# Patient Record
Sex: Male | Born: 1940 | Race: White | Hispanic: No | Marital: Married | Smoking: Never smoker
Health system: Southern US, Community
[De-identification: ages and names within clinical notes are randomized; demographics above are authoritative.]

## PROBLEM LIST (undated history)

## (undated) DIAGNOSIS — H3320 Serous retinal detachment, unspecified eye: Secondary | ICD-10-CM

## (undated) DIAGNOSIS — Z87442 Personal history of urinary calculi: Secondary | ICD-10-CM

## (undated) DIAGNOSIS — Z8719 Personal history of other diseases of the digestive system: Secondary | ICD-10-CM

## (undated) DIAGNOSIS — H35039 Hypertensive retinopathy, unspecified eye: Secondary | ICD-10-CM

## (undated) DIAGNOSIS — E039 Hypothyroidism, unspecified: Secondary | ICD-10-CM

## (undated) DIAGNOSIS — I2510 Atherosclerotic heart disease of native coronary artery without angina pectoris: Secondary | ICD-10-CM

## (undated) DIAGNOSIS — M1990 Unspecified osteoarthritis, unspecified site: Secondary | ICD-10-CM

## (undated) DIAGNOSIS — E785 Hyperlipidemia, unspecified: Secondary | ICD-10-CM

## (undated) DIAGNOSIS — I10 Essential (primary) hypertension: Secondary | ICD-10-CM

## (undated) DIAGNOSIS — K219 Gastro-esophageal reflux disease without esophagitis: Secondary | ICD-10-CM

## (undated) DIAGNOSIS — Z8709 Personal history of other diseases of the respiratory system: Secondary | ICD-10-CM

## (undated) DIAGNOSIS — G4730 Sleep apnea, unspecified: Secondary | ICD-10-CM

## (undated) HISTORY — PX: TONSILLECTOMY: SUR1361

## (undated) HISTORY — DX: Hyperlipidemia, unspecified: E78.5

## (undated) HISTORY — PX: EYE SURGERY: SHX253

## (undated) HISTORY — PX: UVULOPALATOPHARYNGOPLASTY: SHX827

## (undated) HISTORY — PX: CATARACT EXTRACTION: SUR2

## (undated) HISTORY — DX: Hypertensive retinopathy, unspecified eye: H35.039

## (undated) HISTORY — DX: Serous retinal detachment, unspecified eye: H33.20

## (undated) HISTORY — PX: PACEMAKER INSERTION: SHX728

---

## 1983-06-06 HISTORY — PX: ESOPHAGOGASTRIC FUNDOPLICATION: SHX405

## 1983-06-06 HISTORY — PX: HERNIA REPAIR: SHX51

## 1988-06-05 HISTORY — PX: UMBILICAL HERNIA REPAIR: SUR1181

## 1990-06-05 HISTORY — PX: INGUINAL HERNIA REPAIR: SUR1180

## 1998-04-07 ENCOUNTER — Ambulatory Visit (HOSPITAL_COMMUNITY): Admission: RE | Admit: 1998-04-07 | Discharge: 1998-04-07 | Payer: Self-pay | Admitting: *Deleted

## 1998-04-07 ENCOUNTER — Encounter: Payer: Self-pay | Admitting: *Deleted

## 1998-04-27 ENCOUNTER — Ambulatory Visit (HOSPITAL_COMMUNITY): Admission: RE | Admit: 1998-04-27 | Discharge: 1998-04-27 | Payer: Self-pay | Admitting: *Deleted

## 1999-06-13 ENCOUNTER — Encounter: Admission: RE | Admit: 1999-06-13 | Discharge: 1999-06-13 | Payer: Self-pay | Admitting: Family Medicine

## 1999-06-27 ENCOUNTER — Encounter: Admission: RE | Admit: 1999-06-27 | Discharge: 1999-06-27 | Payer: Self-pay | Admitting: Sports Medicine

## 2000-02-02 ENCOUNTER — Encounter (INDEPENDENT_AMBULATORY_CARE_PROVIDER_SITE_OTHER): Payer: Self-pay | Admitting: *Deleted

## 2000-02-02 ENCOUNTER — Ambulatory Visit (HOSPITAL_COMMUNITY): Admission: RE | Admit: 2000-02-02 | Discharge: 2000-02-02 | Payer: Self-pay | Admitting: *Deleted

## 2001-12-25 ENCOUNTER — Ambulatory Visit (HOSPITAL_BASED_OUTPATIENT_CLINIC_OR_DEPARTMENT_OTHER): Admission: RE | Admit: 2001-12-25 | Discharge: 2001-12-25 | Payer: Self-pay | Admitting: *Deleted

## 2002-04-02 ENCOUNTER — Ambulatory Visit (HOSPITAL_BASED_OUTPATIENT_CLINIC_OR_DEPARTMENT_OTHER): Admission: RE | Admit: 2002-04-02 | Discharge: 2002-04-02 | Payer: Self-pay | Admitting: *Deleted

## 2002-04-02 ENCOUNTER — Encounter (INDEPENDENT_AMBULATORY_CARE_PROVIDER_SITE_OTHER): Payer: Self-pay | Admitting: *Deleted

## 2003-03-30 ENCOUNTER — Encounter (INDEPENDENT_AMBULATORY_CARE_PROVIDER_SITE_OTHER): Payer: Self-pay

## 2003-03-30 ENCOUNTER — Ambulatory Visit (HOSPITAL_COMMUNITY): Admission: RE | Admit: 2003-03-30 | Discharge: 2003-03-30 | Payer: Self-pay | Admitting: *Deleted

## 2003-06-06 HISTORY — PX: UVULECTOMY: SHX2631

## 2004-06-09 ENCOUNTER — Ambulatory Visit (HOSPITAL_COMMUNITY): Admission: RE | Admit: 2004-06-09 | Discharge: 2004-06-10 | Payer: Self-pay | Admitting: Cardiovascular Disease

## 2005-05-30 ENCOUNTER — Ambulatory Visit (HOSPITAL_BASED_OUTPATIENT_CLINIC_OR_DEPARTMENT_OTHER): Admission: RE | Admit: 2005-05-30 | Discharge: 2005-05-30 | Payer: Self-pay | Admitting: Otolaryngology

## 2005-06-04 ENCOUNTER — Ambulatory Visit: Payer: Self-pay | Admitting: Internal Medicine

## 2005-10-10 ENCOUNTER — Ambulatory Visit (HOSPITAL_COMMUNITY): Admission: RE | Admit: 2005-10-10 | Discharge: 2005-10-11 | Payer: Self-pay | Admitting: Orthopedic Surgery

## 2006-02-28 ENCOUNTER — Ambulatory Visit (HOSPITAL_COMMUNITY): Admission: RE | Admit: 2006-02-28 | Discharge: 2006-03-01 | Payer: Self-pay | Admitting: Otolaryngology

## 2006-06-05 HISTORY — PX: CARDIAC CATHETERIZATION: SHX172

## 2007-05-25 ENCOUNTER — Encounter: Admission: RE | Admit: 2007-05-25 | Discharge: 2007-05-25 | Payer: Self-pay | Admitting: General Surgery

## 2007-08-06 ENCOUNTER — Ambulatory Visit (HOSPITAL_COMMUNITY): Admission: RE | Admit: 2007-08-06 | Discharge: 2007-08-07 | Payer: Self-pay | Admitting: General Surgery

## 2007-08-08 ENCOUNTER — Emergency Department (HOSPITAL_COMMUNITY): Admission: EM | Admit: 2007-08-08 | Discharge: 2007-08-08 | Payer: Self-pay | Admitting: Emergency Medicine

## 2008-01-14 ENCOUNTER — Ambulatory Visit (HOSPITAL_COMMUNITY): Admission: RE | Admit: 2008-01-14 | Discharge: 2008-01-14 | Payer: Self-pay | Admitting: *Deleted

## 2008-01-14 ENCOUNTER — Encounter (INDEPENDENT_AMBULATORY_CARE_PROVIDER_SITE_OTHER): Payer: Self-pay | Admitting: *Deleted

## 2008-06-01 ENCOUNTER — Ambulatory Visit (HOSPITAL_COMMUNITY): Admission: RE | Admit: 2008-06-01 | Discharge: 2008-06-01 | Payer: Self-pay | Admitting: Orthopedic Surgery

## 2010-10-18 NOTE — Op Note (Signed)
NAME:  [name] Glass, [name] Glass                ACCOUNT NO.:  [id number]   MEDICAL RECORD NO.:  [id number]          PATIENT TYPE:  AMB   LOCATION:  DAY                          FACILITY:  [facility]   PHYSICIAN:  Marlowe Kays, M.D.  DATE OF BIRTH:  [birth year]   DATE OF PROCEDURE:  06/01/2008  DATE OF DISCHARGE:                               OPERATIVE REPORT   PREOPERATIVE DIAGNOSES:  1. Chronic olecranon bursitis, right elbow.  2. Chronic impingement syndrome, right shoulder, with rotator cuff      tendinopathy and impingement of acromioclavicular joint on the      rotator cuff.   POSTOPERATIVE DIAGNOSES:  1. Chronic olecranon bursitis, right elbow.  2. Chronic impingement syndrome, right shoulder, with rotator cuff      tendinopathy and impingement of acromioclavicular joint on the      rotator cuff.   OPERATION:  1. Excision of olecranon bursa, right elbow.  2. Right shoulder arthroscopy (essentially normal glenohumeral      examination).  3. Arthroscopic subacromial decompression.  4. Arthroscopic decompression of distal clavicle.   SURGEON:  Marlowe Kays, M.D.   ASSISTANTDruscilla Brownie. Idolina Primer, PA-C   ANESTHESIA:  General.   PATHOLOGY AND JUSTIFICATION FOR PROCEDURE:  He has had a number of  injections in his right elbow with persistent olecranon bursa and  desired this excised.  He has also had chronic pain in his right  shoulder with some tendinosis of the rotator cuff.  He has had a similar  problem in the left shoulder which has done well following arthroscopic  treatment.   PROCEDURE:  Satisfactory general anesthesia first in the supine position  on the spine frame.  Applied pneumatic tourniquet to his right upper  extremity, esmarched out his right arm non sterilely and  inflated  tourniquet to 250 mmHg.  We then prepped the extremity from tourniquet  to wrist with DuraPrep and draped in a sterile field.  Time-out  performed.  I made a gentle curved incision favoring the  radial side  around the olecranon, carrying down through the subcutaneous tissue.  He  had a very vascular bursa.  The bursa was excised carefully.  I did not  violate the ulnar nerve space.  Bleeders were coagulated.  I then  released the tourniquet.  Minimal bleeding was present.  I felt  comfortable closing the wound without a drain.  After irrigating it well  and then injecting the soft tissue with half percent Marcaine with  adrenaline, I closed the wound with interrupted 2-0 Vicryl on the  subcutaneous tissue and staples in the skin.  Our scrub nurse remained  stable while we removed all the drapes, and we then put him in the beach  chair position, and prepped his right shoulder girdle with DuraPrep,  draped in a sterile field.  Time-out had already been performed.  I  marked out the anatomy of the shoulder joint and then infiltrated the  sites for posterior and lateral portals in the subacromial space with  the 0.5% Marcaine with adrenaline.  Through a posterior soft spot portal  I  atraumatically entered the glenohumeral joint.  There was one area  which looked like it had divot removed from the humeral head and there  was some minimal fraying of the labrum, but there did not appear to be  enough pathology to warrant any arthroscopic treatment.  I then  redirected the scope into the subacromial space through a lateral  portal, placed a blunt trocar followed by a 4.2 shaver.  He had a good  bit of bursitis which I resected, also had some abrasion of the rotator  cuff which I smoothed down with the shaver.  He had a substantial  impingement syndrome from the acromion and also 1 large jagged spur from  the undersurface of the distal clavicle which was also visible on x-ray.  After picturing these, I then began removing soft tissue with the 90-  degree Arthrocare vaporizer from the undersurface of the acromion and  distal clavicle, and followed this with a 4-mm oval bur, burring down   where needed.  I then went back and forth between the 3 instruments  until we had a nice clean decompressed subacromial joint and distal  clavicle.  At the conclusion of the case there was no unusual bleeding.  Final pictures were taken with the vaporizer in place and the arm at his  side and arm abducted.  I then removed all fluid possible from the  subacromial space.  The portals were closed with 4-0 nylon and  infiltrated again with 0.5% Marcaine with adrenaline as was the  subacromial space.  Betadine, Adaptic, dry sterile dressing were applied  to both wounds and he was placed in a shoulder immobilizer.  At the time  of this dictation he was on this way to the recovery room in  satisfactory condition with no known complications.           ______________________________  Marlowe Kays, M.D.     JA/MEDQ  D:  06/01/2008  T:  06/01/2008  Job:  [id number]

## 2010-10-18 NOTE — Op Note (Signed)
NAME:  [name] Glass, [name] Glass                ACCOUNT NO.:  [id number]   MEDICAL RECORD NO.:  [id number]          PATIENT TYPE:  AMB   LOCATION:  ENDO                         FACILITY:  [facility]   PHYSICIAN:  Georgiana Spinner, M.D.    DATE OF BIRTH:  1940/08/17   DATE OF PROCEDURE:  01/14/2008  DATE OF DISCHARGE:                               OPERATIVE REPORT   PROCEDURE:  Upper endoscopy with biopsy.   INDICATIONS:  GERD, with question of Barrett's esophagus.   ANESTHESIA:  1. Fentanyl 75 mcg.  2. Versed 7 mg.   PROCEDURE:  With the patient mildly sedated in the left lateral  decubitus position, the Pentax videoscopic endoscope was inserted in the  mouth and passed under direct vision through the esophagus, which  appeared normal, until we reached distal esophagus, and there appeared  to be at least 1 tongue of Barrett's esophagus.  This area was  photographed and biopsied around the perimeter of the squamocolumnar  junction because it would not open well to discern it any further.  We  entered into the stomach.  The fundus was seen, and there were some  erythematous folds were photographed and biopsied.  The body, antrum,  duodenal bulb, and second portion duodenum were also visualized and  appeared normal.  From this point, the endoscope was slowly withdrawn,  taking circumferential views of the duodenal mucosa until the endoscope  had been pulled back into the stomach and placed in retroflexion to view  the stomach from below.  The endoscope was straightened and withdrawn,  taking circumferential views of the remaining gastric and esophageal  mucosa.  The patient's vital signs and pulse oximeter remained stable.  The patient tolerated the procedure well, without apparent  complications.   FINDINGS:  Question of Barrett's esophagus above a hiatal hernia.  Erythematous gastric folds.  Both biopsied.  Await biopsy reports.  The  patient will call me for results and follow up with me as needed  as an  outpatient.           ______________________________  Georgiana Spinner, M.D.     GMO/MEDQ  D:  01/14/2008  T:  01/14/2008  Job:  [id number]   cc:   Molly Maduro L. Foy Guadalajara, M.D.  Fax: [phone]

## 2010-10-18 NOTE — Op Note (Signed)
NAME:  [name] Glass, [name] Glass                ACCOUNT NO.:  [id number]   MEDICAL RECORD NO.:  [id number]          PATIENT TYPE:  AMB   LOCATION:  ENDO                         FACILITY:  [facility]   PHYSICIAN:  Georgiana Spinner, M.D.    DATE OF BIRTH:  [birth year]   DATE OF PROCEDURE:  DATE OF DISCHARGE:                               OPERATIVE REPORT   PROCEDURE:  Colonoscopy.   INDICATIONS:  Colon polyps.   ANESTHESIA:  Fentanyl 50 mcg, Versed 5 mg.   PROCEDURE:  With the patient mildly sedated in the left lateral  decubitus position, a rectal examination was performed.  The prostate  felt somewhat enlarged but symmetrical and non-nodular.  Subsequently  the Pentax videoscopic colonoscope was inserted into the rectum, passed  under direct vision with pressure applied, the patient rolled to his  back and subsequently to his right side.  We were able to reach the  cecum, identified by the ileocecal valve and appendiceal orifice; both  of which were photographed.  From this point the colonoscope was slowly  withdrawn, taking circumferential views of the colonic mucosa, stopping  only in the rectum, which appeared normal on direct and showed  hemorrhoids on retroflexed view.  The endoscope was straightened and  withdrawn.  The patient's vital signs, pulse oximeter remained stable.  The patient tolerated the procedure well without apparent complication.   FINDINGS:  Internal hemorrhoids, thickening with some diverticula seen  in the sigmoid colon, otherwise unremarkable exam.   PLAN:  Consider repeat examination in 5 years.           ______________________________  Georgiana Spinner, M.D.     GMO/MEDQ  D:  01/14/2008  T:  01/14/2008  Job:  [id number]   cc:   Molly Maduro L. Foy [place], M.D.  Fax: [phone]

## 2010-10-18 NOTE — Op Note (Signed)
NAME:  LARONN, DEVONSHIRE                ACCOUNT NO.:  [id number]   MEDICAL RECORD NO.:  [id number]          PATIENT TYPE:  OIB   LOCATION:  2550                         FACILITY:  [facility]   PHYSICIAN:  Ollen Gross. Vernell Morgans, M.D. DATE OF BIRTH:  August 11, 1940   DATE OF PROCEDURE:  08/06/2007  DATE OF DISCHARGE:                               OPERATIVE REPORT   PREOPERATIVE DIAGNOSIS:  Ventral hernia.   POSTOPERATIVE DIAGNOSIS:  Ventral hernia.   PROCEDURE:  Laparoscopic ventral hernia repair.   SURGEON:  Ollen Gross. Vernell Morgans, M.D.   ASSISTANT:  Currie Paris, M.D.   ANESTHESIA:  General endotracheal.   PROCEDURE:  After informed consent was obtained, the patient was brought  to the operating room and placed in supine position on the operating  room table.  After adequate induction of general anesthesia, the  patient's abdomen was prepped with Betadine and draped in usual sterile  manner, including the use of an Ioban drape in the left upper quadrant.  Site was chosen and infiltrated with 0.25% Marcaine.  A small incision  was made with a 15 blade knife.  This incision was carried through the  skin, and subcutaneous tissue, bluntly with a hemostat until the fascia  of the anterior abdominal wall was encountered.  The fascial layers were  incised with a 15 blade knife and grasped with Kocher clamps.  The  muscle layers were spread gently with Army-Navy retractors in the  posterior fascia was also grasped with hemostats and opened with  Metzenbaum scissors.  Blunt dissection was then carried out in the  preperitoneal space until the peritoneum was opened and access was  gained to the abdominal cavity.  The 0-0 Vicryl pursestring stitch was  placed in the fascia surrounding the opening, a Hasson cannula was  placed through the opening and anchored in place with the previously  placed Vicryl pursestring stitch.  The abdomen was then insufflated with  carbon dioxide without difficulty.  The site  was then chosen in the left  lower quadrant for placement of a 5 mm port and two sites were chosen on  the right side of the abdomen for placement of a 5 mm port through these  incisions into the abdominal cavity under direct vision.  The patient  had some adhesions of omentum and transverse colon to the midline.  All  of this was taken down gently by combination of blunt dissection with a  dissector and a Glassman grasper and some sharp dissection with  laparoscopic scissors and harmonic scalpel.  Once these adhesions were  taken down the anterior abdominal wall appeared to be free.  There were  several small Swiss cheese like defects in the anterior abdominal wall  fascia.  The size of the defect was estimated and a 15 x 20 cm piece of  Proceed mass was chosen to cover the defects.  Eight Novofil stitches  were placed at equidistant points around the edge of the mesh.  The mesh  was oriented with the blue surface towards the ceiling and oriented done  with letters that were  written also on the drapes.  The mesh was then  rolled like a cigar placed through the Hasson cannula into the abdominal  cavity.  The mesh was then unrolled and reoriented.  Appropriately eight  stab incisions were made around the defects in the areas that  corresponded to the placement of the stitches and suture passer was used  to bring the tails of the Novofil stitches through the abdominal wall at  each of these eight points.  Once this was accomplished, retraction was  placed on Novofil stitches and the mesh nicely approximated the anterior  abdominal wall without any redundancy or folds.  Each of the stitches  was then tied down.  A Protacking device was then used to fill in the  gaps between the stitches with Protacks in two layers around the  periphery of the mesh.  Once this was accomplished, the mesh was in very  good position in very good apposition to the abdominal wall.  The  abdomen was then inspected.   No other abnormalities were noted.  The  area appeared to be completely hemostatic.  The gas was then allowed to  escape and the mesh was observed to remain in good apposition to the  abdominal wall without any redundancy. The ports were removed under  direct vision and found to be hemostatic.  Fascial defect at the Hasson  cannula port site was closed a previously placed Vicryl pursestring  stitch.  All the skin incisions were then closed with interrupted 4-0  Monocryl subcuticular stitches.  Dermabond dressings were applied along  with Kerlix fluff and abdominal binder.  The patient tolerated procedure  well.  At the end of the case all needle, sponge and instrument counts  were correct.  The patient was awakened and taken to recovery in stable  condition.      Ollen Gross. Vernell Morgans, M.D.  Electronically Signed     PST/MEDQ  D:  08/06/2007  T:  08/06/2007  Job:  [id number]

## 2010-10-21 NOTE — Op Note (Signed)
   NAME:  [name] Glass, [name] Glass                          ACCOUNT NO.:  [id number]   MEDICAL RECORD NO.:  [id number]                   PATIENT TYPE:  AMB   LOCATION:  ENDO                                 FACILITY:  [facility]   PHYSICIAN:  Georgiana Spinner, M.D.                 DATE OF BIRTH:  [birth year]   DATE OF PROCEDURE:  03/30/2003  DATE OF DISCHARGE:                                 OPERATIVE REPORT   PROCEDURE:  Upper endoscopy with biopsy.   INDICATIONS:  GERD.   ANESTHESIA:  Demerol 60 mg, Versed 6 mg.   DESCRIPTION OF PROCEDURE:  With the patient mildly sedated in the left  lateral decubitus position, the Olympus videoscopic endoscope was inserted  in the mouth and passed under direct vision through the esophagus, which  appeared normal until we reached the distal esophagus, and there were some  changes of possible Barrett's esophagus, photographed and biopsied.  We  entered into the stomach.  The fundus, body, antrum, duodenal bulb, second  portion of the duodenum were visualized.  From this point the endoscope was  slowly withdrawn taking circumferential views of the duodenal mucosa until  the endoscope had been pulled back into the stomach, placed in retroflexion  to view the stomach from below, and a hiatal hernia was seen.  The endoscope  was straightened and withdrawn taking circumferential views of the remaining  gastric and esophageal mucosa, stopping in the antrum and subsequently in  the body of the stomach and fundus of the stomach.  In the antrum, erythema  was seen.  In the body and fundus, polyps were seen.  All were biopsied.  The patient's vital signs and pulse oximetry remained stable.  The patient  tolerated the procedure well and without apparent complications.   FINDINGS:  Polyps of fundus and stomach and erythema of the antrum, along  with Barrett's esophagus, all biopsied.   Await biopsy report.  The patient will call me for results and follow up  with me as an  outpatient.                                               Georgiana Spinner, M.D.    GMO/MEDQ  D:  03/30/2003  T:  03/30/2003  Job:  [id number]

## 2010-10-21 NOTE — Op Note (Signed)
NAME:  CAMEREN, EARNEST                ACCOUNT NO.:  [id number]   MEDICAL RECORD NO.:  [id number]          PATIENT TYPE:  OIB   LOCATION:  3303                         FACILITY:  [facility]   PHYSICIAN:  Zola Button T. Lazarus Salines, M.D. DATE OF BIRTH:  12-Aug-1940   DATE OF PROCEDURE:  DATE OF DISCHARGE:  03/01/2006                                 OPERATIVE REPORT   PREOPERATIVE DIAGNOSES:  1. Obstructive sleep apnea.  2. Hypertrophic inferior turbinates.   POSTOPERATIVE DIAGNOSES:  1. Obstructive sleep apnea.  2. Hypertrophic inferior turbinates.   PROCEDURE PERFORMED:  1. Uvulopalatopharyngoplasty.  2. Bilateral submucous resection inferior turbinates.   SURGEON:  Gloris Manchester. Lazarus Salines, M.D.   ANESTHESIA:  General orotracheal.   BLOOD LOSS:  Minimal.   COMPLICATIONS:  None.   FINDINGS:  A slightly corrugated nasal septum but basically midline.  Large  inferior turbinates bilaterally.  No polyps or active drainage.  Surgically  absent tonsils.  A long thin soft palate and thick central uvula.  An [place]  ink tattoo in the midline of the soft palate placed at the preoperative  visit in the office.   PROCEDURE:  With the patient in a comfortable supine position, general  orotracheal anesthesia was induced without difficulty.  At an appropriate  level, the table was turned 90 degrees and the patient placed in a slight  sitting position.  A saline-moistened throat pack was placed.  Nasal  vibrissae were trimmed.  The patient had received preoperative Afrin nasal  spray.  Additional Afrin nasal spray on 1/2 inch x 3 inch cottonoids was  applied to both sides of the nasal septum and  1% Xylocaine with 1:100,000  epinephrine, 6 mL total, was infiltrated into the inferior turbinates.  An  additional 6 mL of the same material was infiltrated into the bulk of the  soft palate and across the arch for intraoperative hemostasis.  Several  minutes were allowed for this to take effect in both sites.   The  materials were removed from the nose and observed to be intact and  correct in number.  The findings were as described above.  Beginning on the  right side, the anterior hood of the inferior turbinate was lysed just  behind the nasal valve.  The medial mucosa of the turbinate was incised in  an anterior upsloping fashion and a laterally-based flap was developed.  The  turbinate was infractured using angled turbinate scissors.  The turbinate  bone and lateral mucosa were resected in a posterior downsloping fashion,  taking virtually all the anterior pole leaving virtually all the posterior  pole.  The bone chips were dissected and removed to allow more prompt  healing.  The bulbous posterior pole of the turbinate and the cut mucosal  edges were suction and coagulated.  The flap was laid back down.  The  turbinate was outfractured, and this side was completed.  The left side was  done in identical fashion.   A double thickness Neosporin-impregnated Telfa pack was placed low along the  turbinate on each side.  Then 7.0 mm nasal trumpets which  had been shortened  to reach into the nasopharynx were fashioned and placed in each side of the  nose to allow some airway and to serve as part of the packing material.  Hemostasis was observed.  This completed the nasal portion of the procedure.   At this point the patient was flattened and then placed in the true  Trendelenburg.  The drapes were adjusted, taking care to protect lips, teeth  and the endotracheal tube.  The Crowe-Davis mouth gag was introduced,  expanded for visualization and suspended from the Mayo stand in the standard  fashion.  The findings were as described above.  Additional 1% Xylocaine  with 1:100,000 epinephrine, 6 mL total, was infiltrated down the anterior  tonsillar pillars.  Additional time was allowed for hemostasis to take  effect.   The palate was analyzed, including inferior retraction of the uvula and the  tattoo  mark was identified.  The Chevron on the anterior surface of the soft  palate encompassing the [place] ink tattoo mark was placed in the midline with  a sharp scalpel.  A trapezoidal incision horizontally and then angled down  to the anterior pillar was also  begun with a sharp scalpel.  With anterior  and superior traction on the uvula,  a V corresponding with the Chevron was  incised on the posterior surface of the root of the uvula and a mucosal flap  was developed up onto the nasal pharyngeal surface of the soft palate for a  short distance.   Returning to the anterior surface, cautery was used to dissect down through  the minor salivary glands of the soft palate.  The muscularis of the soft  palate was identified and this was carried over and then carried down the  palatopharyngeus muscle.  The mucosa of the anterior tonsillar pillar was  dissected free.  Once the posterior mucosa was identified, the flap was  developed for short distance.  The muscle of the uvula was amputated,  leaving something of a stump.  The dissection was carried across the  anterior pillar at the inferior tonsil fossa and then up, leaving mucosa of  the posterior pillar intact.  The portion of mucosa including anterior  pillars, soft palate and part of the uvula was resected en bloc.  The mucosa  overlying the posterior pillars was further dissected  towards the posterior  pharyngeal wall.  The posterior pillar muscularis was divided near the top  aspect and the stump of the muscle and the palate were retracted laterally  and secured with a #4-0 Vicryl stitch.  The inferior portion was sutured  laterally up into the tonsil fossa.  This began a rather rectangular  configuration of the palatal closure.   The Chevron/mucosal flap was brought from the posterior surface up into the  anterior notch and secured thereto, with no tension using a 4-0 Vicryl stitch.  The closure was taken down and laterally along the free  edge of the  soft palate.  The inferior  portion of the tonsil fossa could not be  completely closed without undue tension and so was allowed to remain open.  There was a slight notch corresponding with the incision into the posterior  tonsillar pillar on both sides which was open and will be allowed to heal by  secondary intention.  By mere inspection, the free edge of the soft palate  was thick but was properly closed and of good configuration.  Hemostasis was  observed.  At this point the mouth gag was relaxed for several minutes.  Upon re-  expansion, hemostasis was persistent.  An orogastric tube was placed briefly  and a small amount of clear secretions was evacuated from the stomach.  The  orogastric tube was removed.  Again hemostasis was observed.  At this point  the mouth gag was relaxed and removed.  The dental status was intact.   The patient was returned to anesthesia, awakened, extubated and transferred  to recovery in stable condition.   COMMENT:  A 70 year old white male with documented obstructive sleep apnea,  with and apnea index of 35 per hour and failed nonsurgical trials including  weight loss, CPAP, nasal steroid sprays, avoidance of sedation; hence the  indication for today's procedure.   Anticipated routine postoperative recovery with attention to analgesia,  antibiosis, hydration, ice, elevation and overnight observation for risk of  hypoxemia and any signs of bleeding or airway obstruction.  Will remove the  nasal packing prior to discharge in the morning and plan on removing the  nasal trumpets at the same time.      Gloris Manchester. Lazarus Salines, M.D.  Electronically Signed     KTW/MEDQ  D:  02/28/2006  T:  03/02/2006  Job:  [id number]   cc:   Nanetta Batty, M.D.  [facility]  Teena Irani. Arlyce Dice, M.D.

## 2010-10-21 NOTE — Procedures (Signed)
[place]. [facility]  Patient:    [name] Glass, [name] Glass                       MRN: [id number] Proc. Date: 02/02/00 Adm. Date:  [id number] Attending:  Sabino Gasser                           Procedure Report  PROCEDURE PERFORMED:  Upper endoscopy.  ENDOSCOPIST:  Sabino Gasser, M.D.  INDICATIONS FOR PROCEDURE:  Barretts esophagus.  ANESTHESIA:  Demerol 100 mg, Versed 10 mg was given intravenously in divided doses.  DESCRIPTION OF PROCEDURE:  With the patient mildly sedated in the left lateral decubitus position, the Olympus video endoscope was inserted in the mouth and passed under direct vision through the esophagus which appeared normal until we reached the distal esophagus where there were changes of hiatal hernia and Barretts esophagus seen, photographed and biopsied.  We entered into the stomach, advanced to the antrum which showed changes of erythema consistent with gastritis.  We photographed this and biopsied this as well.  We entered into the duodenal bulb and second portion of the duodenum both of which normal.  Photographs were taken.  From this point, the endoscope was slowly withdrawn taking circumferential views of the entire duodenal mucosa until the endoscope had been pulled back into the stomach and placed on retroflexion to view the stomach from below and this view showed [place] a number of polyps seen in the gastric folds.  They were photographed and subsequently then using snare cautery technique, a number of them were removed at a setting of 20/20 blended current.  All of them were then pulled up using a Roth retrieval basket and removed from the stomach with the endoscope as we took circumferential views of the remaining gastric and esophageal mucosa.  Patients vital signs and pulse oximeter remained stable.  The patient tolerated the procedure well without apparent complications.  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  PLAN:  Await biopsy report.  Will have patient call me for results and follow up with me as an outpatient.  Proceed to follow-up colonoscopy. PLAN: DD:  02/02/00 TD:  02/03/00 Job: [id number]

## 2010-10-21 NOTE — Op Note (Signed)
NAME:  [name] Glass, [name] Glass                          ACCOUNT NO.:  [id number]   MEDICAL RECORD NO.:  [id number]                   PATIENT TYPE:  AMB   LOCATION:  DSC                                  FACILITY:  [facility]   PHYSICIAN:  Veverly Fells. Arletha Grippe, M.D.             DATE OF BIRTH:  18-Jan-1941   DATE OF PROCEDURE:  04/02/2002  DATE OF DISCHARGE:                                 OPERATIVE REPORT   PREOPERATIVE DIAGNOSES:  Nasal airway obstruction, nasal septal deviation,  inferior turbinate hypertrophy.   POSTOPERATIVE DIAGNOSES:  Nasal airway obstruction, nasal septal deviation,  inferior turbinate hypertrophy.   OPERATION PERFORMED:  Nasal septal reconstruction, intramural cauterization  of both inferior turbinates.   SURGEON:  Veverly Fells. Arletha Grippe, M.D.   ANESTHESIA:  General endotracheal.   INDICATIONS FOR PROCEDURE:  This is a 70 year old white male who gives a  long history of nasal airway obstruction, especially during the night time  hours.  He has been on Flonase which has helped only minimally, resolving  his nasal airway obstruction.  A CT scan of the sinuses was obtained on November 19, 2001 essentially showed a significant septal deviation left with  synechiae between the left inferior turbinate and septum on that side.  The  paranasal sinuses were normal without signs of any infection.  Inpatient  polysomnogram obtained on December 25, 2001 did show an RDI of 12 events per  hour, desaturations down to 87%, consistent with mild obstructive sleep  apnea.  Based on his history and physical examination and failure of medical  therapy, I have recommended proceeding with the above-noted surgical  procedure.  I have discussed extensively with him and his family, the risks  and benefits of surgery including the risks of general anesthesia,  infection, bleeding and the need for septal splinting and a normal recovery  period expected after this type of surgery.  I have entertained any  questions, answered them appropriately.  Informed consent has been obtained.  The patient presents for the above-noted procedure.   OPERATIVE FINDINGS:  Significant septal deviation left with inferior  turbinate hypertrophy with synechiae between the septum and the left  inferior turbinate.   DESCRIPTION OF PROCEDURE:  The patient was brought to the operating room and  placed in supine position.  General endotracheal anesthesia was administered  via the anesthesiologist without complications.  The patient was  administered 1 gm of Ancef IV times one and 10 mg of Decadron IV times one.  The head of the table was elevated approximately 30 degrees and the  patient's face was draped in the standard fashion.  Cotton pledgets were  soaked in a 4% cocaine solution and placed in both nares and left in place  for approximately five to 10 minutes and then removed.  Both sides of the  septum were infiltrated with a 1% lidocaine solution with 1:100,000  epinephrine.  After waiting approximately  10 minutes, a standard Killian  incision was made in the left side of the septum.  Mucoperichondrial and  mucoperiosteal flap was elevated on the left side using both blunt and sharp  dissection.  An intercartilaginous incision was made approximately 1 cm  posterior to the cartilaginous septum.  Mucoperichondrial and mucoperiosteal  flap was elevated on the right side using both blunt and sharp dissection.  Cartilaginous deviation was removed with a Ballenger swivel knife.  The  posterior bony deflection was removed with a Jansen-Middleton forceps and  the septal spur which was removed with a small osteotome.  A piece of  trimmed morselized cartilage was placed in between the septal flaps.  The  synechiae between the middle turbinate and septum was lysed with a small  Metzenbaum scissors.  The septal incision was closed with interrupted  chromic suture and the septum was reinforced with a running transeptal  plain  gut mattress stitch.  Both inferior turbinates were injected with a total of  6 cc of a 1% lidocaine solution with 1:100,000 epinephrine.  Both inferior  turbinates were then intramurally cauterized using the Elmed intramural  cauterization unit set at a 12 watt setting.  Three passes of both inferior  turbinates performed without difficulty.  Both inferior turbinates were then  outfractured using gentle lateral pressure with a large nasal speculum.  Doyle splints soaked in a Bactroban ointment solution were placed on either  of the septum, held in place with a transeptal Prolene suture.  The  orogastric tube was placed  and this was used to decompress the stomach  contents.  It was then removed without incident.  Fluids given in the  procedure were approximately 1L of crystalloid.  The estimated blood loss  for this procedure was less than 30 cc.  Urine output was not measured.  There were no drains.  The above-noted two Doyle splints were placed.  Specimens sent were septal cartilage and bone for gross pathology only.  The  patient tolerated the procedure well without complications, was extubated in  the operating room and transferred to the recovery room in stable condition.  Sponge, needle and instrument counts were correct at the end of the  procedure.  Total duration of the procedure was approximately one hour.  The  patient will be discharged to home once he has recovered here in the  recovery room.  He will be sent home on Keflex 500 mg p.o. t.i.d. for 10  days and Vicodin #30 with two refills, one tab p.o. q.4h. p.r.n. pain.  He  is to have light activity, no heavy lifting or nose blowing for two weeks  after surgery.  Both him and his family were given oral and written  instructions.  They are to call for any problems with bleeding, fever,  vomiting, pain, reaction to medications or any other questions.  He will follow up in the office for splint removal on Wednesday,  November 5, at 4:15  p.m.                                                 Veverly Fells. Arletha Grippe, M.D.    MDR/MEDQ  D:  04/02/2002  T:  04/02/2002  Job:  [id number]   cc:   Teena Irani. Arlyce Dice, M.D.  [address]  [place]  [place] [address]  Fax: [phone]

## 2010-10-21 NOTE — Cardiovascular Report (Signed)
NAME:  DEJION, GRILLO NO.:  [id number]   MEDICAL RECORD NO.:  [id number]          PATIENT TYPE:  OIB   LOCATION:  2899                         FACILITY:  [facility]   PHYSICIAN:  Nanetta Batty, M.D.   DATE OF BIRTH:  [birth year]   DATE OF PROCEDURE:  06/09/2004  DATE OF DISCHARGE:                              CARDIAC CATHETERIZATION   Mr. Dobratz is a 70 year old gentleman, patient of Dr. Marzetta Board, with a  history of hypertension, hyperlipidemia, and family history. He had a  positive cardiac stress test with heart catheterization at [facility] revealing three-vessel disease. He presents now for multivessel PCI  and stenting.   PROCEDURE DESCRIPTION:  The patient was brought to the 2nd floor of the  [facility] coronary catheterization lab in the post-absorptive state. He was  premedicated with p.o. Valium, IV Versed, and Nubain. His right groin was  prepped and shaved in the usual sterile fashion. Xylocaine 1% was used for  local anesthesia. A 7-French sheath was inserted into the right femoral  artery using standard Seldinger technique. A 7-French left 3.5 __________  guide catheter along with OM-4 support guide wire, and 25-15 Maverick  balloon were used for intervention on the circumflex coronary artery. The  patient was on aspirin and Plavix at home.  He received an additional 150 mg  of p.o. Plavix, a total of 10,000 units of heparin intravenously and  Integrilin double bolus and infusion. Visipaque dye was used for the entire  length of the case. Retrograde aortic pressures were marked during the case.   The __________ catheter engaged in the left main and an OM-4 support was  passed easily into the distal circumflex. Pre-dilatations were performed  with a 25-15 Maverick and stenting was a 3.0 18-CYPHER resulting in  reduction of a 95% mid circumflex to 0% residual with excellent flow.   Angiography was also performed on the LAD revealing segmental  disease in the  mid LAD as well as in the distal LAD with disease in the proximal portion of  the diagonal branch.   Attention was then focused on the right coronary artery. Initial guides that  were used were a AL-1, AR-23. Then a 1.4-190 support guide wire was  initially used, followed by an [employer], Asahi pure water, and BMW. Pre-  dilatation is performed on June 09, 2004, followed by a 2.0/12 Voyager  throughout the entirety of the proximal mid vessel. This was upgraded to a  2.5, 2.75, and finally 3.0/15 Voyager. Dr. Alanda Amass assisted during this  part of the case. Multiple attempts were made to pass the stent into the  distal right unsuccessfully because of guide wire bias and finally a BMW was  used a buddy wire with the pure water Asahi wire already in place. Stenting  was performed of the entire proximal mid RCA using from the distal to  proximal 3.0 13-CYPHER, 2.5 13-CYPHER, and 3.0 18-CPHER and a 3.0 13-CYPHER.  All stents were post dilated with a 3.0 balloon up to 15 atmospheres and 200  mcg of intracoronary nitroglycerin was administered. The  ending ST was 221.  The patient did get bradycardic at the end of the case and hypotensive  requiring fluid resuscitation and 1 mg of IV atropine which resulted in  prompt restoration of heart rate and blood pressure. He did not have chest  pain or ST-segment changes throughout the entirety of the case.   OVERALL IMPRESSION:  Successful multivessel PCI and stenting using drug-  eluting stents. The patient tolerated the procedure well. The guide wire and  catheters were removed. The sheaths were sewn securely in place. The patient  left the catheterization lab in stable condition. Plan will be to remove the  sheaths once he __________.  Integrilin will be continued for a total of 18 hours. He will be hydrated  well overnight and his renal function will be assessed in the morning. He  did receive 450 cc of dye because of the  difficult nature of the procedure.  He left the lab in stable condition.      JB/MEDQ  D:  06/09/2004  T:  06/09/2004  Job:  [id number]   cc:   [facility]  [address], [place] [address]   Teena Irani. Arlyce Dice, M.D.  [address]  [place]  [place] [address]  Fax: [phone]

## 2010-10-21 NOTE — Procedures (Signed)
NAME:  [name] Glass, KUNST                ACCOUNT NO.:  [id number]   MEDICAL RECORD NO.:  [id number]          PATIENT TYPE:  OUT   LOCATION:  SLEEP CENTER                 FACILITY:  [facility]   PHYSICIAN:  Clinton D. Maple Hudson, M.D. DATE OF BIRTH:  16-Mar-1941   DATE OF STUDY:                              NOCTURNAL POLYSOMNOGRAM   REFERRING PHYSICIAN:  Dr. Flo Shanks.   DATE OF STUDY:  May 30, 2005.   INDICATION FOR STUDY:  Hypersomnia with sleep apnea.   EPWORTH SLEEPINESS SCORE:  2/24.   BMI:  28.   WEIGHT:  220 pounds.   HOME MEDICATIONS:  Benicar/HCT, Sular, Zetia, baby aspirin, Pravachol,  Plavix, Prilosec, niacin, fish oil.   SLEEP ARCHITECTURE:  Total sleep time 340 minutes with sleep efficiency 83%.  Stage I was 6%, stage II 80%, stages III and IV absent, REM 13% of total  sleep time. Sleep latency 8 minutes, REM latency 139 minutes, awake after  sleep onset 49 minutes, arousal index increased at 59. No bedtime medication  was reported.   RESPIRATORY DATA:  Apnea/hypopnea index (AHI, RDI) 37.7 obstructive events  per hour indicating moderately severe obstructive sleep apnea/hypopnea  syndrome. There were 3 central apneas, 115 obstructive apneas, 7 mixed  apneas and 89 hypopneas. Events were not positional although somewhat more  frequent while supine. REM AHI 76.5. Split protocol had been requested and  the patient qualified for C-PAP titration by protocol but he refused C-PAP  so the study was continued as a diagnostic study.   OXYGEN DATA:  Loud snoring with oxygen desaturation to a nadir of 73%. Mean  oxygen saturation through the study was 91% on room air.   CARDIAC DATA:  Normal sinus rhythm.   MOVEMENT/PARASOMNIA:  Frequent leg jerks totaling 502 of which 23 were  associated with arousal or awakening for periodic limb movement with arousal  index of 4.1 per hour. Bathroom x3.   IMPRESSION/RECOMMENDATIONS:  1.  Moderately severe obstructive sleep apnea/hypopnea  syndrome, AHI 37.7      per hour with nonpositional events, very loud snoring and oxygen      desaturation to 73%.  2.  Split protocol C-PAP titration study had been ordered but the patient      declined C-PAP.  3.  Very frequent limb jerks with relatively few of these clearly causing      awakening or arousal, periodic limb      movement index 4.1 per hour.  4.  Nocturia x3, further contributing to sleep disturbance but possibly      secondary to the sleep apnea.      Clinton D. Maple Hudson, M.D.  Diplomate, Biomedical engineer of Sleep Medicine  Electronically Signed     CDY/MEDQ  D:  06/04/2005 09:58:58  T:  06/04/2005 16:10:09  Job:  [id number]

## 2010-10-21 NOTE — Op Note (Signed)
NAME:  DELAINE, CANTER                ACCOUNT NO.:  [id number]   MEDICAL RECORD NO.:  [id number]          PATIENT TYPE:  OIB   LOCATION:  3303                         FACILITY:  [facility]   PHYSICIAN:  Zola Button T. Lazarus Salines, M.D. DATE OF BIRTH:  [birth year]   DATE OF PROCEDURE:  02/28/2006  DATE OF DISCHARGE:  03/01/2006                                 OPERATIVE REPORT   PREOPERATIVE DIAGNOSIS:  Obstructive sleep apnea.  Hypertrophic inferior  turbinates.   POSTOPERATIVE DIAGNOSIS:  Obstructive sleep apnea.  Hypertrophic inferior  turbinates.   PROCEDURE PERFORMED:  A UPPP, bilateral SNR inferior turbinates.   SURGEON:  Gloris Manchester. Lazarus Salines, M.D.   ANESTHESIA:  General orotracheal.   BLOOD LOSS:  Minimal.   COMPLICATIONS:  None.   FINDINGS:  Hypertrophic inferior turbinates with obstruction.  Mild septal  corrugation.  A long thick soft palate and central uvula was surgically  absent tonsils.   PROCEDURE:  With the patient in the comfortable supine position, general  orotracheal anesthesia was induced without difficulty.  At an appropriate  level, the table was turned 90 degrees and the patient placed in a semi-  sitting position.  A saline moistened throat pack was placed.  Nasal  vibrissae were trimmed.  1% Xylocaine with 1:100,000 epinephrine, 6 mL total  was infiltrated into the inferior turbinates for intraoperative hemostasis.  Preoperative Afrin spray had been delivered.  A sterile preparation and  draping of the midface was accomplished in the standard fashion.   Inspecting the nose once again the findings were as described above.  Beginning on the right side, the anterior hood of the inferior turbinate was  sharply lysed behind the nasal valve.  The medial mucosa of the turbinate  was incised and an anterior upsloping fashion and a laterally based flap was  developed.  The turbinate was in-fractured.  Using angled turbinate  scissors, the turbinate bone and lateral mucosa were  resected in a posterior  downsloping fashion taking all of the anterior pole and leaving all of the  posterior pole.  Bony spicules were carefully removed.  The bulbous  posterior pole of the turbinate was suction coagulated as were the cut  mucosal edges.  The flap was laid back down, the turbinate was outfractured  and this side was completed.  After completing the right side, the left side  was done in identical fashion.  After completing both sides, a double  thickness Neosporin impregnated Telfa pack was placed against the turbinate  on both sides, and a 6.5-mm nasal trumpet which had been shortened to reach  the nasopharynx as part of the packing was used on each side.  This  completed the nasal portion of the procedure.   The patient was flattened out and then placed in Trendelenburg.  The pharynx  was suctioned clean and the throat pack was removed.  Taking care to protect  the lips, teeth, and endotracheal tube, the Crowe-Davis mouth gag was  introduced, expanded for visualization, and suspended from the Mayo stand in  the standard fashion.  The findings were as described above.  Additional  1%  Xylocaine with 1:100,000 epinephrine, 6 mL total was infiltrated into the  arch of the soft palate at the proposed incision site and down into the  tonsil fossae on each side.  Several minutes were allowed for hemostasis  take effect.  The tattoo mark placed at the palatal muscular dimple point in  the office was identified.   The sharp V Chevron was placed over the tattoo mark and a sharp incision was  carried across the soft palate and then down the anterior tonsillar pillar  in an almost rectangular fashion.  Working sharply and using cautery for  hemostasis, the dissection was carried perpendicular into the substance of  the soft palate full-thickness.  Upon arriving at the uvula, a posterior  mucosal flap was elevated as with a short posterior flap on the nasal  pharyngeal surface of  the soft palate.  The remainder of the tissue was  resected en bloc.  The posterior tonsillar pillar had been dissected and the  mucosa overlying this was submucosally elevated.  The posterior pillar was  cross cut and the muscle was tacked up to the soft palate superiorly with a  4-0 Vicryl stitch.  The mucosa of the oropharynx was secured to the anterior  tonsillar pillar with interrupted 4-0 Vicryl stitches including the  posterior tonsillar pillar.  The posterior surface of the soft palate was  brought forward into the Chevron in the midline and across the raw edges and  the suture line was laid across the anterior surface of the soft palate  again in a rectangular fashion.  Mirror inspection revealed this to be  rather thick, but with an intact closure and a decent nasopharyngeal  introitus.   At this point the palatoplasty was completed.  The mouth gag was relaxed for  several minutes.  Upon re-expansion, hemostasis was persistent.  At this  point the procedure was completed.  The mouth gag was relaxed and removed.  The dental status was intact.  The patient was returned to Anesthesia,  awakened, extubated, and transferred to recovery in stable condition.   COMMENT:  A 70 year old white male with obstructive sleep apnea with  components from nasal congestion/obstruction and from a long soft palate  were the indications for the several components of today's procedure.  Anticipate a routine postoperative recovery with attention to analgesia,  antibiosis, hydration, and observation for bleeding, emesis, airway  compromise, or oxygen desaturation.      Gloris Manchester. Lazarus Salines, M.D.  Electronically Signed     KTW/MEDQ  D:  03/16/2006  T:  03/19/2006  Job:  [id number]   cc:   Zola Button T. Lazarus Salines, M.D.  Nanetta Batty, M.D.  Teena Irani. Arlyce Dice, M.D.

## 2010-10-21 NOTE — Op Note (Signed)
NAME:  [name] Glass, [name] Glass                ACCOUNT NO.:  [id number]   MEDICAL RECORD NO.:  [id number]          PATIENT TYPE:  OIB   LOCATION:  1512                         FACILITY:  [facility]   PHYSICIAN:  Marlowe Kays, M.D.  DATE OF BIRTH:  February 23, 1941   DATE OF PROCEDURE:  10/10/2005  DATE OF DISCHARGE:  10/11/2005                                 OPERATIVE REPORT   PREOP DIAGNOSIS.:  1.  Bilateral osteoarthritis knees.  2.  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   POSTOP DIAGNOSIS:  1.  Bilateral osteoarthritis knees.  2.  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   OPERATION:  1.  Bilateral knee injection with Celestone and Xylocaine.  2.  a. Left shoulder arthroscopy (normal examination).  b. Arthroscopic      subacromial and distal clavicle decompression.   SURGEON:  Marlowe Kays, M.D.   ASSISTANT:  Mr. Adrian Blackwater for procedure #2.   ANESTHESIA:  General.   PATHOLOGY AND JUSTIFICATION FOR PROCEDURE:  He is a regular patient of one  of my partners, Dr. Lequita Halt, who has treated him for bilateral  osteoarthritis of knees with steroid injections.  He was scheduled to have  them up and coming but because he and the patient have requested my  injecting both knees today.  I major reason for being here is his left  shoulder which he injured lifting a box in late December of last year or  early January of this year.  He had persistent pain, left shoulder with an  MRI of the shoulder demonstrating a type 3 acromion, a split longitudinal  tear of the anterior fibers of the supraspinatus muscle and edema of the  distal clavicle.  There were also were some paralabral cysts which suggests  the presence of a labral tear but none was identified on the MRI.  Accordingly he is here for the above mentioned surgery.   PROCEDURE:  Satisfactory anesthesia, beach-chair position on the [place]  frame.  While the  knees were flexed position I prepared them for injection  through the patellar tendon with Betadine prep and then injected 2 mL of 1%  plain Xylocaine and 40 mg of Celestone into the knee joint in each case,  then cleansing the needle tract and applying band aids.  Then went ahead and  prepped his left shoulder with DuraPrep, draped in sterile field.  Anatomy  of the shoulder joint was marked out and subacromial space lateral and  posterior portals infiltrated with 0.5% Marcaine with Adrenalin.  Through a  posterior soft spot portal, I atraumatically entered the glenohumeral joint.  On examination it really looked unremarkable.  His labrum demonstrated no  tear.  I could not see the cyst.  Biceps tendon, rotator cuff, humeral head  were all normal and representative pictures were taken.  Consequently I then  redirected the scope in the subacromial space.  He had significant  impingement of the distal clavicle which was somewhat mobile and the  anterior acromion associated with a  very flagrant bursitis.  Through the  lateral portal, I introduced a 4.2 shaver and cleared most of the bursitis  tissue and then followed this with the ArthroCare 90 degree vaporizer, which  I used to remove soft tissue from behind the surface of the distal acromion  and underneath surface of extensive bursa on the distal clavicle.  I  followed this with the 12 mm oval bur and again resecting the undersurface  of the anterior acromion and distal clavicle and worked back and forth using  the three instruments removing obstructive soft tissue, shaving the rotator  cuff and resecting more soft tissue from the anterior leading edge of the  acromion and burring down all the impinging bone.  At the conclusion of the  case, he had wide resection of not only the anterior acromion but also the  distal clavicle verified with his arm to his side and his arm abducted with  pictures being taken.  We then removed all fluid  possible.  I saw no full  thickness rotator cuff tear that required any open repair.  I then removed  all fluid possible from the subacromial space and infiltrated the two  portals and subacromial space, once again 0.5% Marcaine with Adrenalin.  Two  portals were closed with 4-0 nylon.  Betadine, Adaptic, dry sterile dressing  and shoulder immobilizer applied.  The patient tolerated the procedure well  and was taken to the recovery room in satisfactory condition with no known  complication.           ______________________________  Marlowe Kays, M.D.     JA/MEDQ  D:  10/10/2005  T:  10/11/2005  Job:  [id number]

## 2010-10-21 NOTE — Discharge Summary (Signed)
NAME:  [name] Glass, [name] Glass NO.:  [id number]   MEDICAL RECORD NO.:  [id number]          PATIENT TYPE:  OIB   LOCATION:  6527                         FACILITY:  [facility]   PHYSICIAN:  Nanetta Batty, M.D.   DATE OF BIRTH:  Nov 02, 1940   DATE OF ADMISSION:  06/09/2004  DATE OF DISCHARGE:                                 DISCHARGE SUMMARY   HISTORY AND HOSPITAL COURSE:  [name] Glass is a 70 year old white male patient  of Dr. Nanetta Batty who came in for an elective PCI procedure.  He had  undergone cardiac catheterization at [facility] on June 01, 2004.  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  The case was discussed with Dr. Jenne Campus and  it decided that he should undergo PCI.  Thus, he had subsequent recath with  PCI June 09, 2004 by Dr. Nanetta Batty.  He had a 3 x 18 CYPHER stent  placed to his left circumflex, reduced from 95% to 0%.  His RCA received two  CYPHER stents, 3 x 13, one 2.5 x 13 and one 3 x 18 CYPHER stents.  He was  given Integrilin for 18 hours.  He received aspirin and Plavix which he was  on prior to his coming in for the procedure.  He was seen by Dr. Nanetta Batty on June 10, 2004, considered stable for discharged home.   PHYSICAL EXAMINATION:  VITAL SIGNS: Blood pressure 120/70, pulse 70.  He was  in normal sinus rhythm.   LABORATORY DATA:  Hemoglobin 12.4, hematocrit 36.7, platelets 324, WBC 829.  His BMET showed sodium 138, potassium 4.1, glucose 114, BUN 14, creatinine  1.   DISCHARGE MEDICATIONS:  1.  Aspirin 81 mg one time per day.  2.  Plavix 75 mg one time per day.  He should be on this forever.  3.  Benicar 40 mg one time per day.  4.  Zetia 10 mg one time per day.  5.  Sular 20 mg one time per day.  6.  Prilosec 20 mg two times per day.  7.  __________ 200 mg two times per day.  8.  Zocor 40 mg at bedtime.  9.  Niaspan 500 mg two times per day.  10.  Multivitamin one time per day.  11. Omega-3 fish oil as taken previously.  12. Nitroglycerin he can use as needed for chest pain.   ACTIVITY:  No strenuous activity, pushing or pulling for four days.  No  strenuous exercise for a week.   WOUND CARE:  If he has any problems with his groin, he can give [place] a call.   FOLLOW UP:  He will follow up with Dr. Allyson Sabal on February 3, at 11:45 a.m.   DISCHARGE DIAGNOSES:  1.  Unstable angina with positive Cardiolite.  2.  Status post cardiac catheterization at [facility] with      three-vessel disease.  3.  Status post percutaneous intervention at [facility] on June 09, 2004 by Dr. Nanetta Batty.  He had one CYPHER stent to his left      circumflex and four CYPHER stents to his right coronary artery.  He does      have residual disease in his left anterior descending which will be      treated medically for now with future plans for possible stenting.  4.  Hyperlipidemia.  5.  Hypertension.  6.  Gastroesophageal reflux disease and Barrett's esophagus followed by Dr.      Virginia Rochester.  7.  Family history of coronary artery disease.  8.  Normal left ventricular function.      BB/MEDQ  D:  06/10/2004  T:  06/10/2004  Job:  [id number]   cc:   Teena Irani. Arlyce Dice, M.D.  [address]  [place]  [place] [address]  Fax: [phone]

## 2011-02-27 LAB — COMPREHENSIVE METABOLIC PANEL
ALT: 30
AST: 24
CO2: 28
Calcium: 10.4
Creatinine, Ser: 1.22
GFR calc Af Amer: >60
GFR calc non Af Amer: 59 — ABNORMAL LOW
Glucose, Bld: 94
Sodium: 136
Total Protein: 6.8

## 2011-02-27 LAB — DIFFERENTIAL
Eosinophils Absolute: 0.1
Lymphocytes Relative: 29
Lymphs Abs: 2.6
Monocytes Relative: 16 — ABNORMAL HIGH
Neutrophils Relative %: 53

## 2011-02-27 LAB — URINE CULTURE: Colony Count: NO GROWTH

## 2011-02-27 LAB — URINALYSIS, ROUTINE W REFLEX MICROSCOPIC
Glucose, UA: NEGATIVE
Leukocytes, UA: NEGATIVE
Specific Gravity, Urine: 1.008
pH: 6.5

## 2011-02-27 LAB — CBC
MCHC: 33.1
MCV: 94.3
RDW: 20.1 — ABNORMAL HIGH

## 2011-02-27 LAB — URINE MICROSCOPIC-ADD ON

## 2011-03-10 LAB — BASIC METABOLIC PANEL
BUN: 11 mg/dL (ref 6–23)
CO2: 27 mEq/L (ref 19–32)
Calcium: 10.2 mg/dL (ref 8.4–10.5)
Chloride: 108 mEq/L (ref 96–112)
Creatinine, Ser: 0.98 mg/dL (ref 0.4–1.5)
GFR calc Af Amer: >60 mL/min (ref >60)
Glucose, Bld: 65 mg/dL — ABNORMAL LOW (ref 70–99)

## 2011-03-10 LAB — HEMOGLOBIN AND HEMATOCRIT, BLOOD: HCT: 54.4 % — ABNORMAL HIGH (ref 39.0–52.0)

## 2011-06-06 HISTORY — PX: PROSTATE ABLATION: SHX6042

## 2011-06-26 DIAGNOSIS — E7800 Pure hypercholesterolemia, unspecified: Secondary | ICD-10-CM | POA: Diagnosis not present

## 2011-06-26 DIAGNOSIS — Z23 Encounter for immunization: Secondary | ICD-10-CM | POA: Diagnosis not present

## 2011-06-26 DIAGNOSIS — I2510 Atherosclerotic heart disease of native coronary artery without angina pectoris: Secondary | ICD-10-CM | POA: Diagnosis not present

## 2011-06-26 DIAGNOSIS — I10 Essential (primary) hypertension: Secondary | ICD-10-CM | POA: Diagnosis not present

## 2011-06-26 DIAGNOSIS — Z0000 Encounter for general adult medical examination without abnormal findings: Secondary | ICD-10-CM | POA: Diagnosis not present

## 2011-07-07 DIAGNOSIS — H1030 Unspecified acute conjunctivitis, unspecified eye: Secondary | ICD-10-CM | POA: Diagnosis not present

## 2011-07-07 DIAGNOSIS — H5710 Ocular pain, unspecified eye: Secondary | ICD-10-CM | POA: Diagnosis not present

## 2011-07-07 DIAGNOSIS — H04329 Acute dacryocystitis of unspecified lacrimal passage: Secondary | ICD-10-CM | POA: Diagnosis not present

## 2011-07-12 DIAGNOSIS — H04329 Acute dacryocystitis of unspecified lacrimal passage: Secondary | ICD-10-CM | POA: Diagnosis not present

## 2011-07-28 DIAGNOSIS — N401 Enlarged prostate with lower urinary tract symptoms: Secondary | ICD-10-CM | POA: Diagnosis not present

## 2011-10-17 LAB — HM COLONOSCOPY: HM Colonoscopy: NORMAL

## 2011-10-19 ENCOUNTER — Other Ambulatory Visit: Payer: Self-pay | Admitting: Dermatology

## 2011-10-19 DIAGNOSIS — L723 Sebaceous cyst: Secondary | ICD-10-CM | POA: Diagnosis not present

## 2011-11-02 DIAGNOSIS — B958 Unspecified staphylococcus as the cause of diseases classified elsewhere: Secondary | ICD-10-CM | POA: Diagnosis not present

## 2012-01-01 DIAGNOSIS — E7800 Pure hypercholesterolemia, unspecified: Secondary | ICD-10-CM | POA: Diagnosis not present

## 2012-01-01 DIAGNOSIS — J309 Allergic rhinitis, unspecified: Secondary | ICD-10-CM | POA: Diagnosis not present

## 2012-01-01 DIAGNOSIS — I10 Essential (primary) hypertension: Secondary | ICD-10-CM | POA: Diagnosis not present

## 2012-02-11 DIAGNOSIS — T83091A Other mechanical complication of indwelling urethral catheter, initial encounter: Secondary | ICD-10-CM | POA: Diagnosis not present

## 2012-02-11 DIAGNOSIS — N3090 Cystitis, unspecified without hematuria: Secondary | ICD-10-CM | POA: Diagnosis not present

## 2012-02-11 DIAGNOSIS — R319 Hematuria, unspecified: Secondary | ICD-10-CM | POA: Diagnosis not present

## 2012-02-11 DIAGNOSIS — T8389XA Other specified complication of genitourinary prosthetic devices, implants and grafts, initial encounter: Secondary | ICD-10-CM | POA: Diagnosis not present

## 2012-02-11 DIAGNOSIS — R339 Retention of urine, unspecified: Secondary | ICD-10-CM | POA: Diagnosis not present

## 2012-02-13 DIAGNOSIS — R310 Gross hematuria: Secondary | ICD-10-CM | POA: Diagnosis not present

## 2012-02-13 DIAGNOSIS — R972 Elevated prostate specific antigen [PSA]: Secondary | ICD-10-CM | POA: Diagnosis not present

## 2012-02-15 DIAGNOSIS — R310 Gross hematuria: Secondary | ICD-10-CM | POA: Diagnosis not present

## 2012-02-29 DIAGNOSIS — R310 Gross hematuria: Secondary | ICD-10-CM | POA: Diagnosis not present

## 2012-02-29 DIAGNOSIS — R972 Elevated prostate specific antigen [PSA]: Secondary | ICD-10-CM | POA: Diagnosis not present

## 2012-04-01 DIAGNOSIS — M25569 Pain in unspecified knee: Secondary | ICD-10-CM | POA: Diagnosis not present

## 2012-04-08 DIAGNOSIS — M25569 Pain in unspecified knee: Secondary | ICD-10-CM | POA: Diagnosis not present

## 2012-04-15 DIAGNOSIS — Z23 Encounter for immunization: Secondary | ICD-10-CM | POA: Diagnosis not present

## 2012-04-18 DIAGNOSIS — M25569 Pain in unspecified knee: Secondary | ICD-10-CM | POA: Diagnosis not present

## 2012-04-24 DIAGNOSIS — M25569 Pain in unspecified knee: Secondary | ICD-10-CM | POA: Diagnosis not present

## 2012-05-08 DIAGNOSIS — M25569 Pain in unspecified knee: Secondary | ICD-10-CM | POA: Diagnosis not present

## 2012-05-22 DIAGNOSIS — M25569 Pain in unspecified knee: Secondary | ICD-10-CM | POA: Diagnosis not present

## 2012-05-23 DIAGNOSIS — M1710 Unilateral primary osteoarthritis, unspecified knee: Secondary | ICD-10-CM | POA: Diagnosis not present

## 2012-06-10 DIAGNOSIS — S1093XA Contusion of unspecified part of neck, initial encounter: Secondary | ICD-10-CM | POA: Diagnosis not present

## 2012-06-10 DIAGNOSIS — S0083XA Contusion of other part of head, initial encounter: Secondary | ICD-10-CM | POA: Diagnosis not present

## 2012-06-10 DIAGNOSIS — S0003XA Contusion of scalp, initial encounter: Secondary | ICD-10-CM | POA: Diagnosis not present

## 2012-06-10 DIAGNOSIS — S63509A Unspecified sprain of unspecified wrist, initial encounter: Secondary | ICD-10-CM | POA: Diagnosis not present

## 2012-06-10 DIAGNOSIS — J069 Acute upper respiratory infection, unspecified: Secondary | ICD-10-CM | POA: Diagnosis not present

## 2012-06-19 DIAGNOSIS — I10 Essential (primary) hypertension: Secondary | ICD-10-CM | POA: Diagnosis not present

## 2012-06-19 DIAGNOSIS — Z125 Encounter for screening for malignant neoplasm of prostate: Secondary | ICD-10-CM | POA: Diagnosis not present

## 2012-06-19 DIAGNOSIS — E7800 Pure hypercholesterolemia, unspecified: Secondary | ICD-10-CM | POA: Diagnosis not present

## 2012-06-25 DIAGNOSIS — Z0000 Encounter for general adult medical examination without abnormal findings: Secondary | ICD-10-CM | POA: Diagnosis not present

## 2012-06-25 DIAGNOSIS — Z0289 Encounter for other administrative examinations: Secondary | ICD-10-CM | POA: Diagnosis not present

## 2012-06-25 DIAGNOSIS — I10 Essential (primary) hypertension: Secondary | ICD-10-CM | POA: Diagnosis not present

## 2012-06-25 DIAGNOSIS — I2510 Atherosclerotic heart disease of native coronary artery without angina pectoris: Secondary | ICD-10-CM | POA: Diagnosis not present

## 2012-06-25 DIAGNOSIS — E7800 Pure hypercholesterolemia, unspecified: Secondary | ICD-10-CM | POA: Diagnosis not present

## 2012-07-22 ENCOUNTER — Emergency Department (HOSPITAL_BASED_OUTPATIENT_CLINIC_OR_DEPARTMENT_OTHER)
Admission: EM | Admit: 2012-07-22 | Discharge: 2012-07-22 | Disposition: A | Discharge status: Home / Self Care | Payer: Medicare Other | Attending: Emergency Medicine | Admitting: Emergency Medicine

## 2012-07-22 ENCOUNTER — Encounter (HOSPITAL_BASED_OUTPATIENT_CLINIC_OR_DEPARTMENT_OTHER): Payer: Self-pay | Admitting: *Deleted

## 2012-07-22 DIAGNOSIS — N139 Obstructive and reflux uropathy, unspecified: Secondary | ICD-10-CM | POA: Insufficient documentation

## 2012-07-22 DIAGNOSIS — K219 Gastro-esophageal reflux disease without esophagitis: Secondary | ICD-10-CM | POA: Insufficient documentation

## 2012-07-22 DIAGNOSIS — R300 Dysuria: Secondary | ICD-10-CM | POA: Insufficient documentation

## 2012-07-22 DIAGNOSIS — R109 Unspecified abdominal pain: Secondary | ICD-10-CM | POA: Insufficient documentation

## 2012-07-22 DIAGNOSIS — R319 Hematuria, unspecified: Secondary | ICD-10-CM | POA: Diagnosis not present

## 2012-07-22 DIAGNOSIS — Z7901 Long term (current) use of anticoagulants: Secondary | ICD-10-CM | POA: Insufficient documentation

## 2012-07-22 DIAGNOSIS — N135 Crossing vessel and stricture of ureter without hydronephrosis: Secondary | ICD-10-CM | POA: Diagnosis not present

## 2012-07-22 DIAGNOSIS — Z79899 Other long term (current) drug therapy: Secondary | ICD-10-CM | POA: Diagnosis not present

## 2012-07-22 DIAGNOSIS — I10 Essential (primary) hypertension: Secondary | ICD-10-CM | POA: Insufficient documentation

## 2012-07-22 DIAGNOSIS — R339 Retention of urine, unspecified: Secondary | ICD-10-CM | POA: Diagnosis not present

## 2012-07-22 DIAGNOSIS — N368 Other specified disorders of urethra: Secondary | ICD-10-CM

## 2012-07-22 HISTORY — DX: Essential (primary) hypertension: I10

## 2012-07-22 HISTORY — DX: Gastro-esophageal reflux disease without esophagitis: K21.9

## 2012-07-22 LAB — URINALYSIS, MICROSCOPIC ONLY
Bilirubin Urine: NEGATIVE
Glucose, UA: NEGATIVE mg/dL
Nitrite: NEGATIVE
Specific Gravity, Urine: 1.013 (ref 1.005–1.030)
pH: 6.5 (ref 5.0–8.0)

## 2012-07-22 NOTE — ED Notes (Signed)
MD at bedside. 

## 2012-07-22 NOTE — ED Notes (Signed)
Hematuria and dysuria since yesterday am. States he takes blood thinners. Hx of same.

## 2012-07-22 NOTE — ED Provider Notes (Signed)
History  This chart was scribed for [name] Munch, MD by Bennett Scrape, ED Scribe. This patient was seen in room MH10/MH10 and the patient's care was started at 6:11 PM.  CSN: [id number]  Arrival date & time 07/22/12  1803   First MD Initiated Contact with Patient 07/22/12 1811      Chief Complaint  Patient presents with  . Hematuria     The history is provided by the patient. No language interpreter was used.    [name] Glass is a 72 y.o. male who presents to the Emergency Department complaining of gradual onset, waxing and waning hematuria described as dark red since yesterday with associated intermittent suprapubic pain and decreased urine for past 14 hours. He states that he is on Plavix and took one ASA and one Advil today after which he began to noticed his urine turning dark red. He states that he became more concerned when he developed pain this morning. He states that he drove to the ED with the original onset but upon arrival was able to "pass the clots". He then returned home without being seen only to return 4.5 hours later with the same complaint. He states that he was also able to "pass the clots" before being seen and returned home once again. He is here for evaluation because he has been unable to pass any urine for the past 4 hours. He reports one episode of the same after similar circumstances  in December 2013 that was treated with a foley catheter with a leg bag. He denies penile or testicular pain, fevers, chills, nausea and emesis as associated symptoms. He has a h/o HTN and GERD and is an occasional alcohol user but denies smoking.  Urologist is Dr. Chauncy Lean  Past Medical History  Diagnosis Date  . GERD (gastroesophageal reflux disease)   . Hypertension     Past Surgical History  Procedure Laterality Date  . Prostate surgery    . Tonsillectomy    . Hernia repair      No family history on file.  History  Substance Use Topics  . Smoking status: Never Smoker    . Smokeless tobacco: Not on file  . Alcohol Use: Yes      Review of Systems  Constitutional:       Per HPI, otherwise negative  HENT:       Per HPI, otherwise negative  Respiratory:       Per HPI, otherwise negative  Cardiovascular:       Per HPI, otherwise negative  Gastrointestinal: Negative for vomiting.  Endocrine:       Negative aside from HPI  Genitourinary:       Neg aside from HPI   Musculoskeletal:       Per HPI, otherwise negative  Skin: Negative.   Neurological: Negative for syncope.    Allergies  Review of patient's allergies indicates no known allergies.  Home Medications   Current Outpatient Rx  Name  Route  Sig  Dispense  Refill  . clopidogrel (PLAVIX) 75 MG tablet   Oral   Take 75 mg by mouth daily.         . nisoldipine (SULAR) 20 MG 24 hr tablet   Oral   Take 20 mg by mouth daily.         . Olmesartan Medoxomil (BENICAR PO)   Oral   Take by mouth.         [name] omeprazole (PRILOSEC) 20 MG capsule   Oral  Take 20 mg by mouth daily.           Triage Vitals: BP 152/80  Pulse 87  Temp(Src) 98.3 F (36.8 C) (Oral)  Resp 20  SpO2 97%  Physical Exam  Nursing note and vitals reviewed. Constitutional: He is oriented to person, place, and time. He appears well-developed. No distress.  HENT:  Head: Normocephalic and atraumatic.  Eyes: Conjunctivae and EOM are normal.  Cardiovascular: Normal rate and regular rhythm.   Pulmonary/Chest: Effort normal. No stridor. No respiratory distress.  Abdominal: He exhibits no distension.  Musculoskeletal: He exhibits no edema.  Neurological: He is alert and oriented to person, place, and time.  Skin: Skin is warm and dry.  Psychiatric: He has a normal mood and affect.    ED Course  BLADDER CATHETERIZATION Date/Time: 07/22/2012 7:00 PM Performed by: [name] Glass Authorized by: [name] Glass Consent: Verbal consent obtained. The procedure was performed in an emergent situation. Risks  and benefits: risks, benefits and alternatives were discussed Consent given by: patient Patient understanding: patient states understanding of the procedure being performed Patient consent: the patient's understanding of the procedure matches consent given Procedure consent: procedure consent matches procedure scheduled Relevant documents: relevant documents present and verified Test results: test results available and properly labeled Site marked: the operative site was marked Imaging studies: imaging studies available Required items: required blood products, implants, devices, and special equipment available Patient identity confirmed: verbally with patient Time out: Immediately prior to procedure a "time out" was called to verify the correct patient, procedure, equipment, support staff and site/side marked as required. Indications: urethral obstruction and urine specimen collection Local anesthesia used: no Patient sedated: no Preparation: Patient was prepped and draped in the usual sterile fashion. Catheter insertion: temporary indwelling Catheter size: 16 Fr Complicated insertion: no Altered anatomy: no Bladder irrigation: no Number of attempts: 1 Urine volume: 250 ml Urine characteristics: bloody Patient tolerance: Patient tolerated the procedure well with no immediate complications.   (including critical care time)  DIAGNOSTIC STUDIES: Oxygen Saturation is 97% on room air, adequate by my interpretation.    COORDINATION OF CARE: 6:35 PM-Discussed treatment plan which includes foley cathter and UA with pt at bedside and pt agreed to plan. Pt decline pain medications opting to wait to see if the catheter improves his symptoms.  Labs Reviewed  URINALYSIS, MICROSCOPIC ONLY - Abnormal; Notable for the following:    Color, Urine RED (*)    APPearance CLOUDY (*)    Hgb urine dipstick LARGE (*)    Ketones, ur 15 (*)    Protein, ur 100 (*)    Leukocytes, UA TRACE (*)     Squamous Epithelial / LPF FEW (*)    All other components within normal limits   No results found.   No diagnosis found.  7:37 PM Results discussed with the patient.  There is a symptom of bloody urine draining through the patient's Foley catheter.  He states that he feels substantially better.  MDM   I personally performed the services described in this documentation, which was scribed in my presence. The recorded information has been reviewed and is accurate.  This patient presents with hematuria, urinary retention.  The patient is otherwise in no distress, with no other focal complaints.  Given the patient's use of Plavix, there suspicion for medication associated obstruction.  Following placement of a Foley catheter there was production of significant amounts of urine, blood.  No evidence of infection.  The patient has a urologist,  as well as a cardiologist with whom he will follow up in the next days to discuss both blood thinner requirements, and catheter removal.      [name] Munch, MD 07/22/12 [phone]

## 2012-07-23 DIAGNOSIS — R059 Cough, unspecified: Secondary | ICD-10-CM | POA: Diagnosis not present

## 2012-07-23 DIAGNOSIS — R05 Cough: Secondary | ICD-10-CM | POA: Diagnosis not present

## 2012-07-24 DIAGNOSIS — N401 Enlarged prostate with lower urinary tract symptoms: Secondary | ICD-10-CM | POA: Diagnosis not present

## 2012-07-24 DIAGNOSIS — R310 Gross hematuria: Secondary | ICD-10-CM | POA: Diagnosis not present

## 2012-07-29 ENCOUNTER — Other Ambulatory Visit: Payer: Self-pay | Admitting: Urology

## 2012-07-31 ENCOUNTER — Encounter (HOSPITAL_COMMUNITY): Payer: Self-pay | Admitting: Pharmacy Technician

## 2012-08-02 ENCOUNTER — Inpatient Hospital Stay (HOSPITAL_COMMUNITY): Admission: RE | Admit: 2012-08-02 | Payer: Medicare Other | Source: Ambulatory Visit

## 2012-08-02 ENCOUNTER — Other Ambulatory Visit: Payer: Self-pay | Admitting: Gastroenterology

## 2012-08-02 ENCOUNTER — Encounter (HOSPITAL_COMMUNITY)
Admission: RE | Admit: 2012-08-02 | Discharge: 2012-08-02 | Disposition: A | Discharge status: Home / Self Care | Payer: Medicare Other | Source: Ambulatory Visit | Attending: Urology | Admitting: Urology

## 2012-08-02 ENCOUNTER — Encounter (HOSPITAL_COMMUNITY): Payer: Self-pay

## 2012-08-02 ENCOUNTER — Ambulatory Visit (HOSPITAL_COMMUNITY)
Admission: RE | Admit: 2012-08-02 | Discharge: 2012-08-02 | Disposition: A | Discharge status: Home / Self Care | Payer: Medicare Other | Source: Ambulatory Visit | Attending: Urology | Admitting: Urology

## 2012-08-02 DIAGNOSIS — K219 Gastro-esophageal reflux disease without esophagitis: Secondary | ICD-10-CM | POA: Insufficient documentation

## 2012-08-02 DIAGNOSIS — I2510 Atherosclerotic heart disease of native coronary artery without angina pectoris: Secondary | ICD-10-CM | POA: Diagnosis not present

## 2012-08-02 DIAGNOSIS — M5380 Other specified dorsopathies, site unspecified: Secondary | ICD-10-CM | POA: Diagnosis not present

## 2012-08-02 DIAGNOSIS — E039 Hypothyroidism, unspecified: Secondary | ICD-10-CM | POA: Insufficient documentation

## 2012-08-02 DIAGNOSIS — Z01812 Encounter for preprocedural laboratory examination: Secondary | ICD-10-CM | POA: Insufficient documentation

## 2012-08-02 DIAGNOSIS — G4730 Sleep apnea, unspecified: Secondary | ICD-10-CM | POA: Diagnosis not present

## 2012-08-02 DIAGNOSIS — I10 Essential (primary) hypertension: Secondary | ICD-10-CM | POA: Insufficient documentation

## 2012-08-02 DIAGNOSIS — R310 Gross hematuria: Secondary | ICD-10-CM | POA: Diagnosis not present

## 2012-08-02 DIAGNOSIS — Z01818 Encounter for other preprocedural examination: Secondary | ICD-10-CM | POA: Diagnosis not present

## 2012-08-02 DIAGNOSIS — K449 Diaphragmatic hernia without obstruction or gangrene: Secondary | ICD-10-CM | POA: Diagnosis not present

## 2012-08-02 HISTORY — DX: Unspecified osteoarthritis, unspecified site: M19.90

## 2012-08-02 HISTORY — DX: Personal history of other diseases of the digestive system: Z87.19

## 2012-08-02 HISTORY — DX: Hypothyroidism, unspecified: E03.9

## 2012-08-02 HISTORY — DX: Sleep apnea, unspecified: G47.30

## 2012-08-02 HISTORY — DX: Atherosclerotic heart disease of native coronary artery without angina pectoris: I25.10

## 2012-08-02 LAB — SURGICAL PCR SCREEN: MRSA, PCR: NEGATIVE

## 2012-08-02 NOTE — Patient Instructions (Addendum)
20 [name] Glass  08/02/2012   Your procedure is scheduled on:08/06/12   Report to [facility] at 0915 AM.  Call this number if you have problems the morning of surgery [phone]: [phone]   Remember:   Do not eat food or drink liquids After Midnight.     Take these medicines the morning of surgery with A SIP OF WATER: thyroid, prilosec, sular   Do not wear jewelry, make-up or nail polish.  Do not wear lotions, powders, or perfumes. You may wear deodorant.  Do not shave 48 hours prior to surgery. Men may shave face and neck.  Do not bring valuables to the hospital.       Patients discharged the day of surgery will not be allowed to drive home.  Name and phone number of your driver: Marisa Severin [phone]    Please read over the following fact sheets that you were given: MRSA Information.  Birdie Sons, RN  pre op nurse call if needed [phone]    FAILURE TO FOLLOW THESE INSTRUCTIONS MAY RESULT IN CANCELLATION OF YOUR SURGERY   Patient Signature: ___________________________________________

## 2012-08-02 NOTE — Progress Notes (Addendum)
Stress test 01/18/11 on chart, pt thought all labs were done 07/29/12 at MD office- requested values and got lipid panel some CBC and Testosterone level. Will order BMET for day of surgery and CBC since platelet count was not noted on records.

## 2012-08-05 NOTE — H&P (Signed)
ctive Problems Problems  1. 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 PSA,Elevated 790.93 5. Pyuria 791.9  History of Present Illness  [name] returns today with recurrent hematuria with clot retention that required 3 trips to the ER over the weekend.  His urine is amber in the foley bag today.  I scoped him in September for the same issue and he had some friable tissue following a prior Greenlight laser.  He has been off of his plavix for 4 days.  He has stopped the aleve as well.   He had been off of finasteride since his laser.  His sexual function has recovered since stopping the finasteride but he has retrograde ejaculation.   Past Medical History Problems  1. History of  Acute Urinary Retention 788.20 2. 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 History of  Cath Stent Placement 4. History of  Esophageal Reflux 530.81 5. History of  Gross Hematuria 599.71 6. History of  Gross Hematuria 599.71 7. History of  Heart Disease 429.9 8. History of  Heart Disease 429.9 9. History of  Hypertension 401.9 10. History of  Nephrolithiasis V13.01  Surgical History Problems  1. History of  Cath Stent Placement 2. History of  Esophagogastric Fundoplasty Nissen Fundoplication 3. History of  Hernia Repair 4. History of  Knee Arthroscopy 5. History of  Shoulder Surgery 6. History of  Surgery Prostate Transurethral Destruction Prostate Tissue  Current Meds 1. Acyclovir CAPS; Therapy: (Recorded:30Jul2009) to 2. Armour Thyroid 60 MG Oral Tablet; Therapy: 10Feb2012 to 3. Benicar 40 MG Oral Tablet; Therapy: (Recorded:09Mar2009) to 4. Cholest Off Complete TABS; Therapy: (Recorded:30Jul2009) to 5. DHEA CAPS; Therapy: (Recorded:30Jul2009) to 6. Femara TABS; Therapy: (Recorded:30Jul2009) to 7. Flonase 50 MCG/ACT Nasal Suspension; Therapy: (Recorded:30Jul2009) to 8. Heart Health Pack MISC; Therapy:  (Recorded:30Jul2009) to 9. Multi-Vitamin TABS; Therapy: (Recorded:30Jul2009) to 10. Omega 3 CAPS; Therapy: (Recorded:30Jul2009) to 11. Omeprazole 20 MG Oral Capsule Delayed Release; Therapy: 08Oct2010 to 12. Omnitrope SOLN; Therapy: (Recorded:10Sep2013) to 13. Plavix 75 MG Oral Tablet; Therapy: (Recorded:09Mar2009) to 14. Potassium Gluconate 595 MG Oral Tablet; Therapy: (Recorded:10Sep2013) to 15. Progesterone Micronized 100 MG Oral Capsule; Therapy: (Recorded:10Sep2013) to 16. Saw Palmetto Complex CAPS; Therapy: (Recorded:30Jul2009) to 17. Sular 20 MG TB24; Therapy: (Recorded:09Mar2009) to 18. Viagra 100 MG Oral Tablet; Therapy: (Recorded:30Jul2009) to 19. Vitamin C TABS; Therapy: (Recorded:30Jul2009) to 20. Vitamin E TABS; Therapy: (Recorded:30Jul2009) to 21. Zetia 10 MG Oral Tablet; Therapy: (Recorded:09Mar2009) to  Allergies Medication  1. Bactrim DS TABS 2. Lipitor TABS 3. Morphine Derivatives  Family History Problems  1. Maternal history of  Alzheimer's Disease 2. Maternal history of  Breast Cancer V16.3 3. Family history of  Death In The Family Father 4. Family history of  Death In The Family Mother 5. Family history of  Family Health Status Number Of Children no children 6. Paternal history of  Heart Disease V17.49 5 bypasses 7. Paternal history of  Nephrolithiasis 8. Maternal history of  Skin Cancer V16.8 squamous cell  Social History Problems  1. Alcohol Use 3-4 a day intermittently 2. Caffeine Use 3 glasses tea a day 3. Marital History - Divorced V61.0 4. Never A Smoker 5. 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 History of  Tobacco Use V15.82  Review of Systems  Constitutional: no fever.  Respiratory: cough (and he started augmentin yesterday. ).    Vitals Vital Signs [Data Includes: Last 1 Day]  19Feb2014 01:11PM  BMI Calculated: 25.67 BSA Calculated: 2.17 Weight: 200 lb  Blood Pressure: 126 / 66 Temperature: 98.4 F Heart  Rate: 69  Results/Data  Flow Rate: Instilled volume 185 ml . Voided 192 ml. A peak flow rate of 13ml/s, mean flow rate of 21ml/s and normal flow curve .    Assessment Assessed  1. Gross Hematuria 599.71 2. 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  He had been on finasteride but had bad side effects.   Plan Benign Prostatic Hypertrophy With Urinary Obstruction (600.01)  1. Follow-up PRN Office  Follow-up  Requested for: 19Feb2014 Gross Hematuria (599.71)  2. Fill, Pull, Flow  Done: 19Feb2014   He is going to discuss reducing his plavix dose with Dr. Allyson Sabal and if the bleeding recurs, he will need to have active fulguration of the bleeding to try to prevent further recurrences.

## 2012-08-05 NOTE — Progress Notes (Signed)
Pt thought he had EKG done last week so refused another one. Requested records: last EKG was in 2008. Will need EKG in short stay

## 2012-08-06 ENCOUNTER — Emergency Department (HOSPITAL_BASED_OUTPATIENT_CLINIC_OR_DEPARTMENT_OTHER)
Admission: EM | Admit: 2012-08-06 | Discharge: 2012-08-06 | Disposition: A | Discharge status: Home / Self Care | Payer: Medicare Other | Attending: Emergency Medicine | Admitting: Emergency Medicine

## 2012-08-06 ENCOUNTER — Ambulatory Visit (HOSPITAL_COMMUNITY): Payer: Medicare Other | Admitting: Anesthesiology

## 2012-08-06 ENCOUNTER — Encounter (HOSPITAL_BASED_OUTPATIENT_CLINIC_OR_DEPARTMENT_OTHER): Payer: Self-pay | Admitting: *Deleted

## 2012-08-06 ENCOUNTER — Ambulatory Visit (HOSPITAL_COMMUNITY)
Admission: RE | Admit: 2012-08-06 | Discharge: 2012-08-06 | Disposition: A | Discharge status: Home / Self Care | Payer: Medicare Other | Source: Ambulatory Visit | Attending: Urology | Admitting: Urology

## 2012-08-06 ENCOUNTER — Encounter (HOSPITAL_COMMUNITY): Payer: Self-pay | Admitting: Anesthesiology

## 2012-08-06 ENCOUNTER — Encounter (HOSPITAL_COMMUNITY): Payer: Self-pay | Admitting: *Deleted

## 2012-08-06 ENCOUNTER — Encounter (HOSPITAL_COMMUNITY)
Admission: RE | Disposition: A | Discharge status: Home / Self Care | Payer: Self-pay | Source: Ambulatory Visit | Attending: Urology

## 2012-08-06 DIAGNOSIS — K219 Gastro-esophageal reflux disease without esophagitis: Secondary | ICD-10-CM | POA: Insufficient documentation

## 2012-08-06 DIAGNOSIS — Z7902 Long term (current) use of antithrombotics/antiplatelets: Secondary | ICD-10-CM | POA: Diagnosis not present

## 2012-08-06 DIAGNOSIS — I2510 Atherosclerotic heart disease of native coronary artery without angina pectoris: Secondary | ICD-10-CM | POA: Insufficient documentation

## 2012-08-06 DIAGNOSIS — N529 Male erectile dysfunction, unspecified: Secondary | ICD-10-CM | POA: Insufficient documentation

## 2012-08-06 DIAGNOSIS — R339 Retention of urine, unspecified: Secondary | ICD-10-CM | POA: Insufficient documentation

## 2012-08-06 DIAGNOSIS — N401 Enlarged prostate with lower urinary tract symptoms: Secondary | ICD-10-CM | POA: Diagnosis not present

## 2012-08-06 DIAGNOSIS — N421 Congestion and hemorrhage of prostate: Secondary | ICD-10-CM | POA: Diagnosis not present

## 2012-08-06 DIAGNOSIS — D400 Neoplasm of uncertain behavior of prostate: Secondary | ICD-10-CM | POA: Diagnosis not present

## 2012-08-06 DIAGNOSIS — I10 Essential (primary) hypertension: Secondary | ICD-10-CM | POA: Diagnosis not present

## 2012-08-06 DIAGNOSIS — Z8739 Personal history of other diseases of the musculoskeletal system and connective tissue: Secondary | ICD-10-CM | POA: Insufficient documentation

## 2012-08-06 DIAGNOSIS — K449 Diaphragmatic hernia without obstruction or gangrene: Secondary | ICD-10-CM | POA: Insufficient documentation

## 2012-08-06 DIAGNOSIS — N138 Other obstructive and reflux uropathy: Secondary | ICD-10-CM | POA: Insufficient documentation

## 2012-08-06 DIAGNOSIS — N3289 Other specified disorders of bladder: Secondary | ICD-10-CM | POA: Diagnosis not present

## 2012-08-06 DIAGNOSIS — R338 Other retention of urine: Secondary | ICD-10-CM | POA: Diagnosis not present

## 2012-08-06 DIAGNOSIS — E039 Hypothyroidism, unspecified: Secondary | ICD-10-CM | POA: Insufficient documentation

## 2012-08-06 DIAGNOSIS — R972 Elevated prostate specific antigen [PSA]: Secondary | ICD-10-CM | POA: Diagnosis not present

## 2012-08-06 DIAGNOSIS — Z79899 Other long term (current) drug therapy: Secondary | ICD-10-CM | POA: Insufficient documentation

## 2012-08-06 DIAGNOSIS — N368 Other specified disorders of urethra: Secondary | ICD-10-CM | POA: Diagnosis not present

## 2012-08-06 DIAGNOSIS — R310 Gross hematuria: Secondary | ICD-10-CM | POA: Insufficient documentation

## 2012-08-06 DIAGNOSIS — R109 Unspecified abdominal pain: Secondary | ICD-10-CM | POA: Insufficient documentation

## 2012-08-06 DIAGNOSIS — Z8719 Personal history of other diseases of the digestive system: Secondary | ICD-10-CM | POA: Insufficient documentation

## 2012-08-06 DIAGNOSIS — G4730 Sleep apnea, unspecified: Secondary | ICD-10-CM | POA: Diagnosis not present

## 2012-08-06 DIAGNOSIS — R82998 Other abnormal findings in urine: Secondary | ICD-10-CM | POA: Insufficient documentation

## 2012-08-06 DIAGNOSIS — Z9861 Coronary angioplasty status: Secondary | ICD-10-CM | POA: Diagnosis not present

## 2012-08-06 HISTORY — PX: TRANSURETHRAL RESECTION OF PROSTATE: SHX73

## 2012-08-06 HISTORY — PX: CYSTOSCOPY: SHX5120

## 2012-08-06 LAB — BASIC METABOLIC PANEL
BUN: 13 mg/dL (ref 6–23)
Calcium: 9.6 mg/dL (ref 8.4–10.5)
Creatinine, Ser: 1.02 mg/dL (ref 0.50–1.35)
GFR calc Af Amer: 83 mL/min — ABNORMAL LOW (ref >90)
GFR calc non Af Amer: 71 mL/min — ABNORMAL LOW (ref >90)
Glucose, Bld: 102 mg/dL — ABNORMAL HIGH (ref 70–99)
Potassium: 3.9 mEq/L (ref 3.5–5.1)

## 2012-08-06 LAB — CBC
MCH: 29.6 pg (ref 26.0–34.0)
MCHC: 33.2 g/dL (ref 30.0–36.0)
Platelets: 409 10*3/uL — ABNORMAL HIGH (ref 150–400)
RDW: 16.4 % — ABNORMAL HIGH (ref 11.5–15.5)

## 2012-08-06 SURGERY — CYSTOSCOPY
Anesthesia: General | Site: Bladder | Wound class: Clean Contaminated

## 2012-08-06 MED ORDER — ONDANSETRON HCL 4 MG/2ML IJ SOLN
INTRAMUSCULAR | Status: DC | PRN
  Administered 2012-08-06 (×2): 2 mg via INTRAVENOUS

## 2012-08-06 MED ORDER — FENTANYL CITRATE 0.05 MG/ML IJ SOLN
INTRAMUSCULAR | Status: DC | PRN
  Administered 2012-08-06: 75 ug via INTRAVENOUS
  Administered 2012-08-06: 50 ug via INTRAVENOUS
  Administered 2012-08-06: 25 ug via INTRAVENOUS

## 2012-08-06 MED ORDER — LACTATED RINGERS IV SOLN
INTRAVENOUS | Status: DC | PRN
  Administered 2012-08-06 (×2): via INTRAVENOUS

## 2012-08-06 MED ORDER — LACTATED RINGERS IV SOLN
INTRAVENOUS | Status: DC
  Administered 2012-08-06: 1000 mL via INTRAVENOUS

## 2012-08-06 MED ORDER — LIDOCAINE HCL 2 % EX GEL
CUTANEOUS | Status: AC
  Filled 2012-08-06: qty 10

## 2012-08-06 MED ORDER — LIDOCAINE HCL (CARDIAC) 20 MG/ML IV SOLN
INTRAVENOUS | Status: DC | PRN
  Administered 2012-08-06: 30 mg via INTRAVENOUS

## 2012-08-06 MED ORDER — FENTANYL CITRATE 0.05 MG/ML IJ SOLN
25.0000 ug | INTRAMUSCULAR | Status: DC | PRN
  Administered 2012-08-06: 50 ug via INTRAVENOUS

## 2012-08-06 MED ORDER — PROMETHAZINE HCL 25 MG/ML IJ SOLN: 6.2500 mg | INTRAMUSCULAR | Status: DC | PRN

## 2012-08-06 MED ORDER — FENTANYL CITRATE 0.05 MG/ML IJ SOLN
INTRAMUSCULAR | Status: AC
  Filled 2012-08-06: qty 2

## 2012-08-06 MED ORDER — CIPROFLOXACIN IN D5W 400 MG/200ML IV SOLN
INTRAVENOUS | Status: AC
  Filled 2012-08-06: qty 200

## 2012-08-06 MED ORDER — CIPROFLOXACIN IN D5W 400 MG/200ML IV SOLN
400.0000 mg | INTRAVENOUS | Status: AC
  Administered 2012-08-06: 400 mg via INTRAVENOUS

## 2012-08-06 MED ORDER — TRAMADOL HCL 50 MG PO TABS: 50.0000 mg | ORAL_TABLET | Freq: Four times a day (QID) | ORAL | Status: DC | PRN

## 2012-08-06 MED ORDER — PROPOFOL 10 MG/ML IV EMUL
INTRAVENOUS | Status: DC | PRN
  Administered 2012-08-06: 25 mg via INTRAVENOUS
  Administered 2012-08-06: 175 mg via INTRAVENOUS

## 2012-08-06 MED ORDER — ACETAMINOPHEN 10 MG/ML IV SOLN
INTRAVENOUS | Status: AC
  Filled 2012-08-06: qty 100

## 2012-08-06 MED ORDER — ACETAMINOPHEN 10 MG/ML IV SOLN
INTRAVENOUS | Status: DC | PRN
  Administered 2012-08-06: 1000 mg via INTRAVENOUS

## 2012-08-06 SURGICAL SUPPLY — 25 items
BAG URINE DRAINAGE (UROLOGICAL SUPPLIES) ×1 IMPLANT
BAG URO CATCHER STRL LF (DRAPE) ×2 IMPLANT
BLADE SURG 15 STRL LF DISP TIS (BLADE) IMPLANT
BLADE SURG 15 STRL SS (BLADE)
CATH FOLEY 3WAY 30CC 22FR (CATHETERS) ×1 IMPLANT
CATH ROBINSON RED A/P 16FR (CATHETERS) IMPLANT
CLOTH BEACON ORANGE TIMEOUT ST (SAFETY) ×2 IMPLANT
DRAPE CAMERA CLOSED 9X96 (DRAPES) ×2 IMPLANT
ELECT BUTTON HF 24-28F 2 30DE (ELECTRODE) ×2 IMPLANT
ELECT HF RESECT BIPO 24F 45 ND (CUTTING LOOP) ×1 IMPLANT
ELECT LOOP MED HF 24F 12D (CUTTING LOOP) ×1 IMPLANT
ELECT REM PT RETURN 9FT ADLT (ELECTROSURGICAL) ×2
ELECTRODE REM PT RTRN 9FT ADLT (ELECTROSURGICAL) ×1 IMPLANT
GLOVE SURG SS PI 8.0 STRL IVOR (GLOVE) ×2 IMPLANT
GOWN PREVENTION PLUS XLARGE (GOWN DISPOSABLE) ×2 IMPLANT
GOWN STRL REIN XL XLG (GOWN DISPOSABLE) ×2 IMPLANT
HOLDER FOLEY CATH W/STRAP (MISCELLANEOUS) ×1 IMPLANT
IV NS IRRIG 3000ML ARTHROMATIC (IV SOLUTION) ×6 IMPLANT
KIT ASPIRATION TUBING (SET/KITS/TRAYS/PACK) ×2 IMPLANT
MANIFOLD NEPTUNE II (INSTRUMENTS) ×2 IMPLANT
PACK CYSTO (CUSTOM PROCEDURE TRAY) ×2 IMPLANT
SUT ETHILON 3 0 PS 1 (SUTURE) IMPLANT
SYR 30ML LL (SYRINGE) ×1 IMPLANT
SYRINGE IRR TOOMEY STRL 70CC (SYRINGE) IMPLANT
TUBING CONNECTING 10 (TUBING) ×2 IMPLANT

## 2012-08-06 NOTE — Transfer of Care (Signed)
Immediate Anesthesia Transfer of Care Note  Patient: [name] Glass  Procedure(s) Performed: Procedure(s): CYSTOSCOPY (N/A) FULGERATION OF PROSTATE WITH GYRUS  (N/A)  Patient Location: PACU  Anesthesia Type:General  Level of Consciousness: awake, alert , oriented and patient cooperative  Airway & Oxygen Therapy: Patient Spontanous Breathing, Patient connected to face mask oxygen and Patient connected to face mask  Post-op Assessment: Post -op Vital signs reviewed and stable  Post vital signs: stable  Complications: No apparent anesthesia complications

## 2012-08-06 NOTE — Anesthesia Postprocedure Evaluation (Signed)
  Anesthesia Post-op Note  Patient: [name] Glass  Procedure(s) Performed: Procedure(s) (LRB): CYSTOSCOPY (N/A) FULGERATION OF PROSTATE WITH GYRUS  (N/A)  Patient Location: PACU  Anesthesia Type: General  Level of Consciousness: awake and alert   Airway and Oxygen Therapy: Patient Spontanous Breathing  Post-op Pain: mild  Post-op Assessment: Post-op Vital signs reviewed, Patient's Cardiovascular Status Stable, Respiratory Function Stable, Patent Airway and No signs of Nausea or vomiting  Last Vitals:  Filed Vitals:   08/06/12 1250  BP:   Pulse: 65  Temp:   Resp: 13    Post-op Vital Signs: stable   Complications: No apparent anesthesia complications

## 2012-08-06 NOTE — ED Notes (Signed)
MD at bedside. 

## 2012-08-06 NOTE — Anesthesia Preprocedure Evaluation (Addendum)
Anesthesia Evaluation  Patient identified by MRN, date of birth, ID band Patient awake    Reviewed: Allergy & Precautions, H&P , NPO status , Patient's Chart, lab work & pertinent test results  Airway Mallampati: II TM Distance: >3 FB Neck ROM: Full    Dental no notable dental hx.    Pulmonary sleep apnea ,  S/P UP3 surgery breath sounds clear to auscultation  Pulmonary exam normal       Cardiovascular hypertension, Pt. on medications + CAD negative cardio ROS  Rhythm:Regular Rate:Normal  Cardiac evaluation 01/2011 was good. Plans to see Dr. Allyson Sabal this month for routine follow-up   Neuro/Psych negative neurological ROS  negative psych ROS   GI/Hepatic Neg liver ROS, hiatal hernia, GERD-  Medicated,  Endo/Other  Hypothyroidism   Renal/GU negative Renal ROS  negative genitourinary   Musculoskeletal negative musculoskeletal ROS (+)   Abdominal   Peds negative pediatric ROS (+)  Hematology negative hematology ROS (+)   Anesthesia Other Findings   Reproductive/Obstetrics negative OB ROS                          Anesthesia Physical Anesthesia Plan  ASA: III  Anesthesia Plan: General   Post-op Pain Management:    Induction: Intravenous  Airway Management Planned: LMA  Additional Equipment:   Intra-op Plan:   Post-operative Plan: Extubation in OR  Informed Consent: I have reviewed the patients History and Physical, chart, labs and discussed the procedure including the risks, benefits and alternatives for the proposed anesthesia with the patient or authorized representative who has indicated his/her understanding and acceptance.   Dental advisory given  Plan Discussed with: CRNA  Anesthesia Plan Comments:         Anesthesia Quick Evaluation

## 2012-08-06 NOTE — ED Notes (Signed)
Urinary retention x 5 hours. States he had a cystoscope earlier today for hematuria.

## 2012-08-06 NOTE — ED Provider Notes (Addendum)
History     CSN: [id number]  Arrival date & time 08/06/12  2107   First MD Initiated Contact with Patient 08/06/12 2123      Chief Complaint  Patient presents with  . Urinary Retention    (Consider location/radiation/quality/duration/timing/severity/associated sxs/prior treatment) Patient is a 72 y.o. male presenting with dysuria. The history is provided by the patient.  Dysuria  This is a new problem. Episode onset: since 4 pm has been unable to urinate. The problem has been gradually worsening. Quality: full, pressure sensation in the suprapubic region. The pain is at a severity of 6/10. The pain is moderate. There has been no fever. Pertinent negatives include no hematuria and no flank pain. Associated symptoms comments: retention. His past medical history is significant for urological procedure. Past medical history comments: pt had cystoscopy and cauterization done today and initially able to urinate but now cannot..    Past Medical History  Diagnosis Date  . GERD (gastroesophageal reflux disease)   . Hypertension   . Coronary artery disease   . Hypothyroidism   . Sleep apnea     hx of, surgery to reverse  . H/O hiatal hernia   . Arthritis     Past Surgical History  Procedure Laterality Date  . Prostate surgery  2013    laser vaporization  . Tonsillectomy    . Cardiac catheterization Right 2003    5 heart stents  . Uvulopalatopharyngoplasty  6 years ago  . Hernia repair      x4    No family history on file.  History  Substance Use Topics  . Smoking status: Never Smoker   . Smokeless tobacco: Never Used  . Alcohol Use: Yes     Comment: 4-5 days a week      Review of Systems  Genitourinary: Negative for dysuria, hematuria and flank pain.  All other systems reviewed and are negative.    Allergies  Morphine and related  Home Medications   Current Outpatient Rx  Name  Route  Sig  Dispense  Refill  . clopidogrel (PLAVIX) 75 MG tablet   Oral   Take  75 mg by mouth daily before breakfast.          . DHEA 25 MG CAPS   Oral   Take 1 capsule by mouth 2 (two) times daily.         [name] ezetimibe (ZETIA) 10 MG tablet   Oral   Take 10 mg by mouth every evening.         . fluticasone (FLONASE) 50 MCG/ACT nasal spray   Nasal   Place 1 spray into the nose daily.         [name] letrozole (FEMARA) 2.5 MG tablet   Oral   Take 2.5 mg by mouth every other day.         . Multiple Vitamin (MULTIVITAMIN WITH MINERALS) TABS   Oral   Take 1 tablet by mouth daily.         [name] NANDROLONE DECANOATE IM   Intramuscular   Inject 0.5 mLs into the muscle every 7 (seven) days.         . nisoldipine (SULAR) 20 MG 24 hr tablet   Oral   Take 20 mg by mouth daily before breakfast.          . NON FORMULARY   Oral   Take 1 tablet by mouth 2 (two) times daily. Ultra natural prostate         .  NON FORMULARY   Oral   Take 2 tablets by mouth 2 (two) times daily. Heart savior         . olmesartan (BENICAR) 40 MG tablet   Oral   Take 40 mg by mouth daily before breakfast.         . Omega-3 Fatty Acids (SUPER OMEGA 3) 500 MG CAPS   Oral   Take 2 capsules by mouth 2 (two) times daily.         [name] omeprazole (PRILOSEC) 20 MG capsule   Oral   Take 20 mg by mouth 2 (two) times daily.          [name] Plant Sterol Stanol-Pantethine (CHOLEST OFF COMPLETE PO)   Oral   Take 2 tablets by mouth 2 (two) times daily.         . potassium gluconate 595 MG TABS   Oral   Take 595 mg by mouth every evening.         . progesterone (PROMETRIUM) 100 MG capsule   Oral   Take 100 mg by mouth every evening.         . sildenafil (VIAGRA) 100 MG tablet   Oral   Take 100 mg by mouth daily as needed for erectile dysfunction.         . Somatropin (OMNITROPE [place])   Subcutaneous   Inject 0.12 mLs into the skin as directed.         . testosterone cypionate (DEPOTESTOTERONE CYPIONATE) 100 MG/ML injection   Intramuscular   Inject 100 mg into the  muscle every 7 (seven) days. For IM use only         . thyroid (ARMOUR) 60 MG tablet   Oral   Take 60 mg by mouth 2 (two) times daily.         . traMADol (ULTRAM) 50 MG tablet   Oral   Take 1 tablet (50 mg total) by mouth every 6 (six) hours as needed for pain.   15 tablet   0   . vitamin C (ASCORBIC ACID) 500 MG tablet   Oral   Take 500 mg by mouth daily.         . vitamin E 400 UNIT capsule   Oral   Take 400 Units by mouth daily.           BP 132/59  Pulse 63  Temp(Src) 97.8 F (36.6 C) (Oral)  Resp 20  Wt 205 lb (92.987 kg)  BMI 26.31 kg/m2  SpO2 98%  Physical Exam  Nursing note and vitals reviewed. Constitutional: He is oriented to person, place, and time. He appears well-developed and well-nourished. No distress.  HENT:  Head: Normocephalic and atraumatic.  Mouth/Throat: Oropharynx is clear and moist.  Eyes: Conjunctivae and EOM are normal. Pupils are equal, round, and reactive to light.  Neck: Normal range of motion. Neck supple.  Cardiovascular: Normal rate.   Pulmonary/Chest: Effort normal.  Abdominal: Soft. There is tenderness in the suprapubic area.  Musculoskeletal: Normal range of motion. He exhibits no edema and no tenderness.  Neurological: He is alert and oriented to person, place, and time.  Skin: Skin is warm and dry. No rash noted. No erythema.  Psychiatric: He has a normal mood and affect. His behavior is normal.    ED Course  Procedures (including critical care time)  Labs Reviewed - No data to display No results found.   1. Acute urinary retention       MDM  Patient had a cystoscopy and cauterization done today do to an ongoing hematuria. He was able to. After the procedure and initially when he home but now has been unable to urinate since 4 PM. He is feeling a lot of pressure but denies any signs of blood. He is hemodynamically stable but appears uncomfortable. Tenderness in the suprapubic region and bedside ultrasound  shows a large amount of retained urine. A Foley catheter placed.  Will have patient followup with Dr. Annabell Howells for catheter removal   9:38 PM Catheter placed and pt feeling much better.  Will f/u with Dr. Annabell Howells and d/ced home.  Urine is non-bloody     Gwyneth Sprout, MD 08/06/12 2138  Gwyneth Sprout, MD 08/06/12 2139

## 2012-08-06 NOTE — Brief Op Note (Signed)
08/06/2012  12:27 PM  PATIENT:  [name] Glass  72 y.o. male  PRE-OPERATIVE DIAGNOSIS:  GROSS HEMATURIA FROM PROSTATE   POST-OPERATIVE DIAGNOSIS:  GROSS HEMATURIA FROM PROSTATE   PROCEDURE:  Procedure(s): CYSTOSCOPY (N/A) FULGERATION OF PROSTATE WITH GYRUS  (N/A)  SURGEON:  Surgeon(s) and Role:    * Anner Crete, MD - Primary  PHYSICIAN ASSISTANT:   ASSISTANTS: none   ANESTHESIA:   general  EBL:  Total I/O In: -  Out: 10 [Blood:10]  BLOOD ADMINISTERED:none  DRAINS: none   LOCAL MEDICATIONS USED:  NONE  SPECIMEN:  No Specimen  DISPOSITION OF SPECIMEN:  N/A  COUNTS:  YES  TOURNIQUET:  * No tourniquets in log *  DICTATION: .Other Dictation: Dictation Number [id number]  PLAN OF CARE: Discharge to home after PACU  PATIENT DISPOSITION:  PACU - hemodynamically stable.   Delay start of Pharmacological VTE agent (>24hrs) due to surgical blood loss or risk of bleeding: yes

## 2012-08-06 NOTE — Interval H&P Note (Signed)
History and Physical Interval Note:  08/06/2012 11:37 AM  [name] Glass  has presented today for surgery, with the diagnosis of GROSS HEMATURIA FROM PROSTATE   The various methods of treatment have been discussed with the patient and family. After consideration of risks, benefits and other options for treatment, the patient has consented to  Procedure(s): CYSTOSCOPY (N/A) FULGERATION OF PROSTATE WITH GYRUS  (N/A) as a surgical intervention .  The patient's history has been reviewed, patient examined, no change in status, stable for surgery.  I have reviewed the patient's chart and labs.  Questions were answered to the patient's satisfaction.     WRENN,JOHN J

## 2012-08-07 ENCOUNTER — Encounter (HOSPITAL_COMMUNITY): Payer: Self-pay | Admitting: Urology

## 2012-08-07 NOTE — Op Note (Signed)
NAMEYANNICK, [name] Glass                ACCOUNT NO.:  [id number]  MEDICAL RECORD NO.:  [id number]  LOCATION:  [facility]                         FACILITY:  [facility]  PHYSICIAN:  Excell Seltzer. Annabell Howells, M.D.    DATE OF BIRTH:  20-Oct-1940  DATE OF PROCEDURE:  08/06/2012 DATE OF DISCHARGE:  08/06/2012                              OPERATIVE REPORT   PREOPERATIVE DIAGNOSIS:  Recurrent gross hematuria.  POSTOPERATIVE DIAGNOSIS:  Recurrent gross hematuria.  PROCEDURE:  Cystoscopy and fulguration of prostatic urethral bleeders with gyrus instruments.  SURGEON:  Excell Seltzer. Annabell Howells, M.D.  ANESTHESIA:  General.  SPECIMEN:  None.  DRAINS:  None.  COMPLICATIONS:  None.  INDICATIONS:  Mr. [name] is a 72 year old white male with a history of BPH and bladder outlet obstruction.  It was previously treated with a GreenLight laser.  He has begun to have intermittent gross hematuria despite stopping his Plavix and aspirin, and cystoscopy revealed a small area of neovascularity at the bladder neck that was felt to be the culprit.  He has elected to undergo fulguration.  FINDINGS OF PROCEDURE:  He was taken to the operating room where he was given Cipro.  A general anesthetic was induced.  He was placed in lithotomy position and fitted with PS hose.  His perineum and genitalia were prepped with Betadine solution.  He was draped in the usual sterile fashion.  Cystoscopy was performed using a 22-French scope, 12 and 70 degree lenses.  Examination revealed a normal urethra.  The external sphincter was intact.  The prostatic urethra was widely patent from his prior laser procedure.  At the apical prostate there was inflammatory edema and polyps, more proximally there was neovascularity at the bladder neck.  There is mostly a mild bladder neck contracture.  Inspection of bladder revealed mild trabeculation.  No tumors or stones were noted. On the posterior wall after the initial cystoscopy there was hemorrhagic area that  came from the tip of the scope.  The ureteral orifices were unremarkable.  Once cystoscopy had been performed, the 28-French continuous resectoscope sheath was placed, this was fitted with an Wandra Scot handle 12 degree lens and a gyrus button, saline was used for the irrigant. Initially fulguration of the obvious bleeder at the left bladder neck within the prostatic urethra was fulgurated.  Upon fulguration of this area, there was some contraction of the tissues which tightened his bladder neck, so I did minor resection/vaporization of the posterior bladder neck to ensure a widely patent prostatic urethra.  Once this has been done additional fulguration was performed in several additional areas of neovascularity, primarily at the floor and lateral apices of the prostate in the area of the edema.  Once these areas were appropriately fulgurated and inspection revealed no additional bleeding.  The scope was removed and the patient was noted to have a good stream.  His bladder was drained.  I did not feel a Foley catheter was indicated.  He was taken down from lithotomy position.  His anesthetic was reversed.  He was moved to recovery room in stable condition.  There were no complications.     Excell Seltzer. Annabell Howells, M.D.     JJW/MEDQ  D:  08/06/2012  T:  08/07/2012  Job:  [id number]

## 2012-08-09 DIAGNOSIS — N401 Enlarged prostate with lower urinary tract symptoms: Secondary | ICD-10-CM | POA: Diagnosis not present

## 2012-08-14 DIAGNOSIS — R310 Gross hematuria: Secondary | ICD-10-CM | POA: Diagnosis not present

## 2012-08-27 ENCOUNTER — Emergency Department (HOSPITAL_BASED_OUTPATIENT_CLINIC_OR_DEPARTMENT_OTHER)
Admission: EM | Admit: 2012-08-27 | Discharge: 2012-08-27 | Disposition: A | Discharge status: Home / Self Care | Payer: Medicare Other | Attending: Emergency Medicine | Admitting: Emergency Medicine

## 2012-08-27 ENCOUNTER — Encounter (HOSPITAL_BASED_OUTPATIENT_CLINIC_OR_DEPARTMENT_OTHER): Payer: Self-pay | Admitting: *Deleted

## 2012-08-27 DIAGNOSIS — T83091A Other mechanical complication of indwelling urethral catheter, initial encounter: Secondary | ICD-10-CM | POA: Diagnosis not present

## 2012-08-27 DIAGNOSIS — I2510 Atherosclerotic heart disease of native coronary artery without angina pectoris: Secondary | ICD-10-CM | POA: Insufficient documentation

## 2012-08-27 DIAGNOSIS — Z79899 Other long term (current) drug therapy: Secondary | ICD-10-CM | POA: Insufficient documentation

## 2012-08-27 DIAGNOSIS — R300 Dysuria: Secondary | ICD-10-CM | POA: Insufficient documentation

## 2012-08-27 DIAGNOSIS — IMO0002 Reserved for concepts with insufficient information to code with codable children: Secondary | ICD-10-CM | POA: Diagnosis not present

## 2012-08-27 DIAGNOSIS — R310 Gross hematuria: Secondary | ICD-10-CM | POA: Insufficient documentation

## 2012-08-27 DIAGNOSIS — I10 Essential (primary) hypertension: Secondary | ICD-10-CM | POA: Insufficient documentation

## 2012-08-27 DIAGNOSIS — Z8719 Personal history of other diseases of the digestive system: Secondary | ICD-10-CM | POA: Diagnosis not present

## 2012-08-27 DIAGNOSIS — G4730 Sleep apnea, unspecified: Secondary | ICD-10-CM | POA: Diagnosis not present

## 2012-08-27 DIAGNOSIS — Z8739 Personal history of other diseases of the musculoskeletal system and connective tissue: Secondary | ICD-10-CM | POA: Diagnosis not present

## 2012-08-27 DIAGNOSIS — K219 Gastro-esophageal reflux disease without esophagitis: Secondary | ICD-10-CM | POA: Diagnosis not present

## 2012-08-27 DIAGNOSIS — R319 Hematuria, unspecified: Secondary | ICD-10-CM | POA: Diagnosis not present

## 2012-08-27 DIAGNOSIS — E039 Hypothyroidism, unspecified: Secondary | ICD-10-CM | POA: Diagnosis not present

## 2012-08-27 DIAGNOSIS — Z7902 Long term (current) use of antithrombotics/antiplatelets: Secondary | ICD-10-CM | POA: Insufficient documentation

## 2012-08-27 DIAGNOSIS — R339 Retention of urine, unspecified: Secondary | ICD-10-CM | POA: Diagnosis not present

## 2012-08-27 LAB — URINALYSIS, MICROSCOPIC ONLY
Ketones, ur: 15 mg/dL — AB
Nitrite: POSITIVE — AB
Urobilinogen, UA: 0.2 mg/dL (ref 0.0–1.0)

## 2012-08-27 LAB — BASIC METABOLIC PANEL
BUN: 21 mg/dL (ref 6–23)
Calcium: 10.6 mg/dL — ABNORMAL HIGH (ref 8.4–10.5)
Creatinine, Ser: 1.2 mg/dL (ref 0.50–1.35)
GFR calc Af Amer: 68 mL/min — ABNORMAL LOW (ref >90)
GFR calc non Af Amer: 59 mL/min — ABNORMAL LOW (ref >90)

## 2012-08-27 MED ORDER — CIPROFLOXACIN HCL 500 MG PO TABS
500.0000 mg | ORAL_TABLET | Freq: Once | ORAL | Status: AC
  Administered 2012-08-27: 500 mg via ORAL
  Filled 2012-08-27: qty 1

## 2012-08-27 MED ORDER — CEPHALEXIN 500 MG PO CAPS: 500.0000 mg | ORAL_CAPSULE | Freq: Four times a day (QID) | ORAL | Status: DC

## 2012-08-27 MED ORDER — TAMSULOSIN HCL 0.4 MG PO CAPS: 0.4000 mg | ORAL_CAPSULE | Freq: Every day | ORAL | Status: DC

## 2012-08-27 MED ORDER — DOXYCYCLINE HYCLATE 100 MG PO CAPS: 100.0000 mg | ORAL_CAPSULE | Freq: Two times a day (BID) | ORAL | Status: DC

## 2012-08-27 NOTE — ED Provider Notes (Signed)
History     CSN: [id number]  Arrival date & time 08/27/12  4098   First MD Initiated Contact with Patient 08/27/12 0455      Chief Complaint  Patient presents with  . Urinary Retention    (Consider location/radiation/quality/duration/timing/severity/associated sxs/prior treatment) Patient is a 72 y.o. male presenting with hematuria. The history is provided by the patient.  Hematuria This is a recurrent problem. The current episode started 6 to 12 hours ago. The problem occurs constantly. The problem has not changed since onset.Pertinent negatives include no chest pain, no abdominal pain, no headaches and no shortness of breath. Nothing aggravates the symptoms. Nothing relieves the symptoms. He has tried nothing for the symptoms. The treatment provided no relief.  Also having urinary retention.  Has had in the past and has had fulguration procedure by Dr. Annabell Howells for hematuria   Past Medical History  Diagnosis Date  . GERD (gastroesophageal reflux disease)   . Hypertension   . Coronary artery disease   . Hypothyroidism   . Sleep apnea     hx of, surgery to reverse  . H/O hiatal hernia   . Arthritis     Past Surgical History  Procedure Laterality Date  . Prostate surgery  2013    laser vaporization  . Tonsillectomy    . Cardiac catheterization Right 2003    5 heart stents  . Uvulopalatopharyngoplasty  6 years ago  . Hernia repair      x4  . Cystoscopy N/A 08/06/2012    Procedure: CYSTOSCOPY;  Surgeon: Anner Crete, MD;  Location: [facility];  Service: Urology;  Laterality: N/A;  . Transurethral resection of prostate N/A 08/06/2012    Procedure: FULGERATION OF PROSTATE WITH GYRUS ;  Surgeon: Anner Crete, MD;  Location: [facility];  Service: Urology;  Laterality: N/A;    History reviewed. No pertinent family history.  History  Substance Use Topics  . Smoking status: Never Smoker   . Smokeless tobacco: Never Used  . Alcohol Use: Yes     Comment: 4-5 days a week      Review  of Systems  Constitutional: Negative for fever.  Respiratory: Negative for shortness of breath.   Cardiovascular: Negative for chest pain.  Gastrointestinal: Negative for abdominal pain.  Genitourinary: Positive for hematuria and difficulty urinating. Negative for dysuria and flank pain.  Neurological: Negative for headaches.  All other systems reviewed and are negative.    Allergies  Morphine and related  Home Medications   Current Outpatient Rx  Name  Route  Sig  Dispense  Refill  . clopidogrel (PLAVIX) 75 MG tablet   Oral   Take 75 mg by mouth daily before breakfast.          . DHEA 25 MG CAPS   Oral   Take 1 capsule by mouth 2 (two) times daily.         [name] ezetimibe (ZETIA) 10 MG tablet   Oral   Take 10 mg by mouth every evening.         . fluticasone (FLONASE) 50 MCG/ACT nasal spray   Nasal   Place 1 spray into the nose daily.         [name] letrozole (FEMARA) 2.5 MG tablet   Oral   Take 2.5 mg by mouth every other day.         . nisoldipine (SULAR) 20 MG 24 hr tablet   Oral   Take 20 mg by mouth daily before breakfast.          .  olmesartan (BENICAR) 40 MG tablet   Oral   Take 40 mg by mouth daily before breakfast.         . omeprazole (PRILOSEC) 20 MG capsule   Oral   Take 20 mg by mouth 2 (two) times daily.          [name] Plant Sterol Stanol-Pantethine (CHOLEST OFF COMPLETE PO)   Oral   Take 2 tablets by mouth 2 (two) times daily.         . potassium gluconate 595 MG TABS   Oral   Take 595 mg by mouth every evening.         . sildenafil (VIAGRA) 100 MG tablet   Oral   Take 100 mg by mouth daily as needed for erectile dysfunction.         . Somatropin (OMNITROPE [place])   Subcutaneous   Inject 0.12 mLs into the skin as directed.         . testosterone cypionate (DEPOTESTOTERONE CYPIONATE) 100 MG/ML injection   Intramuscular   Inject 100 mg into the muscle every 7 (seven) days. For IM use only         . thyroid (ARMOUR) 60 MG  tablet   Oral   Take 60 mg by mouth 2 (two) times daily.         . vitamin C (ASCORBIC ACID) 500 MG tablet   Oral   Take 500 mg by mouth daily.         . vitamin E 400 UNIT capsule   Oral   Take 400 Units by mouth daily.         . Multiple Vitamin (MULTIVITAMIN WITH MINERALS) TABS   Oral   Take 1 tablet by mouth daily.         [name] NANDROLONE DECANOATE IM   Intramuscular   Inject 0.5 mLs into the muscle every 7 (seven) days.         . NON FORMULARY   Oral   Take 1 tablet by mouth 2 (two) times daily. Ultra natural prostate         . NON FORMULARY   Oral   Take 2 tablets by mouth 2 (two) times daily. Heart savior         . Omega-3 Fatty Acids (SUPER OMEGA 3) 500 MG CAPS   Oral   Take 2 capsules by mouth 2 (two) times daily.         . progesterone (PROMETRIUM) 100 MG capsule   Oral   Take 100 mg by mouth every evening.         . traMADol (ULTRAM) 50 MG tablet   Oral   Take 1 tablet (50 mg total) by mouth every 6 (six) hours as needed for pain.   15 tablet   0     BP 165/109  Pulse 94  Temp(Src) 98 F (36.7 C) (Oral)  Resp 22  SpO2 98%  Physical Exam  Constitutional: He is oriented to person, place, and time. He appears well-developed and well-nourished. No distress.  HENT:  Head: Normocephalic and atraumatic.  Mouth/Throat: Oropharynx is clear and moist.  Eyes: Conjunctivae are normal. Pupils are equal, round, and reactive to light.  Neck: Normal range of motion. Neck supple.  Cardiovascular: Normal rate, regular rhythm and intact distal pulses.   Pulmonary/Chest: Effort normal and breath sounds normal.  Abdominal: Soft. Bowel sounds are normal. He exhibits no mass. There is no tenderness. There is no rebound and no guarding.  Musculoskeletal: Normal range of motion.  Neurological: He is alert and oriented to person, place, and time.  Skin: Skin is warm and dry.  Psychiatric: He has a normal mood and affect.    ED Course  Procedures  (including critical care time)  Labs Reviewed  URINALYSIS, MICROSCOPIC ONLY - Abnormal; Notable for the following:    Color, Urine RED (*)    APPearance CLOUDY (*)    Hgb urine dipstick LARGE (*)    Bilirubin Urine LARGE (*)    Ketones, ur 15 (*)    Protein, ur >300 (*)    Nitrite POSITIVE (*)    Leukocytes, UA MODERATE (*)    All other components within normal limits  BASIC METABOLIC PANEL   No results found.   No diagnosis found.    MDM  Foley placed originally draining dark blood now clearing, patient feels markedly improved.  Will d/c on flomax and abx.  Call to be seen by Dr. Annabell Howells in am  No multi vitamins while on Doxycycline as it can decreased the antibiotics effectiveness and no viagra while taking flomax.  Patient verbalizes understanding and agrees to follow up     Sasan Wilkie Smitty Cords, MD 08/27/12 [phone]

## 2012-08-27 NOTE — ED Notes (Signed)
Pt c/o passing clots of blood in urine earlier tonight and is now unable to pass any urine at all. Pt denies fever or weakness. Pt last urinated around 8pm.

## 2012-08-27 NOTE — ED Notes (Signed)
MD at bedside. 

## 2012-08-27 NOTE — ED Notes (Signed)
Foley cath remains in place per MD order, leg bag applied for pt to wear home. F/C continues to drain blood tinged urine with no colts noted at this time.

## 2012-08-28 ENCOUNTER — Emergency Department (HOSPITAL_BASED_OUTPATIENT_CLINIC_OR_DEPARTMENT_OTHER)
Admission: EM | Admit: 2012-08-28 | Discharge: 2012-08-28 | Disposition: A | Discharge status: Home / Self Care | Payer: Medicare Other | Attending: Emergency Medicine | Admitting: Emergency Medicine

## 2012-08-28 ENCOUNTER — Encounter (HOSPITAL_BASED_OUTPATIENT_CLINIC_OR_DEPARTMENT_OTHER): Payer: Self-pay | Admitting: *Deleted

## 2012-08-28 DIAGNOSIS — K219 Gastro-esophageal reflux disease without esophagitis: Secondary | ICD-10-CM | POA: Diagnosis not present

## 2012-08-28 DIAGNOSIS — Z8739 Personal history of other diseases of the musculoskeletal system and connective tissue: Secondary | ICD-10-CM | POA: Diagnosis not present

## 2012-08-28 DIAGNOSIS — R319 Hematuria, unspecified: Secondary | ICD-10-CM | POA: Diagnosis not present

## 2012-08-28 DIAGNOSIS — Z8719 Personal history of other diseases of the digestive system: Secondary | ICD-10-CM | POA: Insufficient documentation

## 2012-08-28 DIAGNOSIS — Z9861 Coronary angioplasty status: Secondary | ICD-10-CM | POA: Diagnosis not present

## 2012-08-28 DIAGNOSIS — IMO0002 Reserved for concepts with insufficient information to code with codable children: Secondary | ICD-10-CM | POA: Insufficient documentation

## 2012-08-28 DIAGNOSIS — I10 Essential (primary) hypertension: Secondary | ICD-10-CM | POA: Insufficient documentation

## 2012-08-28 DIAGNOSIS — R109 Unspecified abdominal pain: Secondary | ICD-10-CM | POA: Diagnosis not present

## 2012-08-28 DIAGNOSIS — T83091A Other mechanical complication of indwelling urethral catheter, initial encounter: Secondary | ICD-10-CM | POA: Diagnosis not present

## 2012-08-28 DIAGNOSIS — Z79899 Other long term (current) drug therapy: Secondary | ICD-10-CM | POA: Insufficient documentation

## 2012-08-28 DIAGNOSIS — I2510 Atherosclerotic heart disease of native coronary artery without angina pectoris: Secondary | ICD-10-CM | POA: Insufficient documentation

## 2012-08-28 DIAGNOSIS — E039 Hypothyroidism, unspecified: Secondary | ICD-10-CM | POA: Insufficient documentation

## 2012-08-28 DIAGNOSIS — Z7902 Long term (current) use of antithrombotics/antiplatelets: Secondary | ICD-10-CM | POA: Insufficient documentation

## 2012-08-28 DIAGNOSIS — G4730 Sleep apnea, unspecified: Secondary | ICD-10-CM | POA: Insufficient documentation

## 2012-08-28 DIAGNOSIS — T8389XA Other specified complication of genitourinary prosthetic devices, implants and grafts, initial encounter: Secondary | ICD-10-CM | POA: Diagnosis not present

## 2012-08-28 DIAGNOSIS — Y838 Other surgical procedures as the cause of abnormal reaction of the patient, or of later complication, without mention of misadventure at the time of the procedure: Secondary | ICD-10-CM | POA: Insufficient documentation

## 2012-08-28 MED ORDER — OXYBUTYNIN CHLORIDE 5 MG PO TABS: 5.0000 mg | ORAL_TABLET | Freq: Three times a day (TID) | ORAL | Status: DC

## 2012-08-28 NOTE — ED Provider Notes (Addendum)
History     CSN: [id number]  Arrival date & time 08/28/12  1706   First MD Initiated Contact with Patient 08/28/12 1726      Chief Complaint  Patient presents with  . Hematuria    (Consider location/radiation/quality/duration/timing/severity/associated sxs/prior treatment) HPI Comments: Sharp and throbbing pain 4/10 in the suprapubic region that does not radiate.  Patient is a 72 y.o. male presenting with hematuria. The history is provided by the patient.  Hematuria This is a recurrent problem. The current episode started 2 days ago. The problem occurs constantly. The problem has been gradually worsening. Associated symptoms include abdominal pain. Associated symptoms comments: Had a foley placed on Monday for retention and UTI.  Starting on Monday night started to pass clots in the foley.  Has been flushing for the last few days and getting clot out but today was unable to get the clot out and foley stopped draining around 11am.  Unable to get in with urology till friday. Nothing aggravates the symptoms. Nothing relieves the symptoms.    Past Medical History  Diagnosis Date  . GERD (gastroesophageal reflux disease)   . Hypertension   . Coronary artery disease   . Hypothyroidism   . Sleep apnea     hx of, surgery to reverse  . H/O hiatal hernia   . Arthritis     Past Surgical History  Procedure Laterality Date  . Prostate surgery  2013    laser vaporization  . Tonsillectomy    . Cardiac catheterization Right 2003    5 heart stents  . Uvulopalatopharyngoplasty  6 years ago  . Hernia repair      x4  . Cystoscopy N/A 08/06/2012    Procedure: CYSTOSCOPY;  Surgeon: Anner Crete, MD;  Location: [facility];  Service: Urology;  Laterality: N/A;  . Transurethral resection of prostate N/A 08/06/2012    Procedure: FULGERATION OF PROSTATE WITH GYRUS ;  Surgeon: Anner Crete, MD;  Location: [facility];  Service: Urology;  Laterality: N/A;    No family history on file.  History  Substance  Use Topics  . Smoking status: Never Smoker   . Smokeless tobacco: Never Used  . Alcohol Use: Yes     Comment: 4-5 days a week      Review of Systems  Constitutional: Negative for fever.  Gastrointestinal: Positive for abdominal pain. Negative for nausea and vomiting.  Genitourinary: Positive for hematuria.  Musculoskeletal: Negative for back pain.  All other systems reviewed and are negative.    Allergies  Morphine and related  Home Medications   Current Outpatient Rx  Name  Route  Sig  Dispense  Refill  . cephALEXin (KEFLEX) 500 MG capsule   Oral   Take 1 capsule (500 mg total) by mouth 4 (four) times daily.   40 capsule   0   . DHEA 25 MG CAPS   Oral   Take 1 capsule by mouth 2 (two) times daily.         [name] doxycycline (VIBRAMYCIN) 100 MG capsule   Oral   Take 1 capsule (100 mg total) by mouth 2 (two) times daily. One po bid x 10 days   20 capsule   0   . ezetimibe (ZETIA) 10 MG tablet   Oral   Take 10 mg by mouth every evening.         . fluticasone (FLONASE) 50 MCG/ACT nasal spray   Nasal   Place 1 spray into the nose daily.         [name]  letrozole (FEMARA) 2.5 MG tablet   Oral   Take 2.5 mg by mouth every other day.         . Multiple Vitamin (MULTIVITAMIN WITH MINERALS) TABS   Oral   Take 1 tablet by mouth daily.         [name] NANDROLONE DECANOATE IM   Intramuscular   Inject 0.5 mLs into the muscle every 7 (seven) days.         . nisoldipine (SULAR) 20 MG 24 hr tablet   Oral   Take 20 mg by mouth daily before breakfast.          . NON FORMULARY   Oral   Take 1 tablet by mouth 2 (two) times daily. Ultra natural prostate         . NON FORMULARY   Oral   Take 2 tablets by mouth 2 (two) times daily. Heart savior         . olmesartan (BENICAR) 40 MG tablet   Oral   Take 40 mg by mouth daily before breakfast.         . Omega-3 Fatty Acids (SUPER OMEGA 3) 500 MG CAPS   Oral   Take 2 capsules by mouth 2 (two) times daily.          [name] omeprazole (PRILOSEC) 20 MG capsule   Oral   Take 20 mg by mouth 2 (two) times daily.          [name] Plant Sterol Stanol-Pantethine (CHOLEST OFF COMPLETE PO)   Oral   Take 2 tablets by mouth 2 (two) times daily.         . potassium gluconate 595 MG TABS   Oral   Take 595 mg by mouth every evening.         . progesterone (PROMETRIUM) 100 MG capsule   Oral   Take 100 mg by mouth every evening.         . sildenafil (VIAGRA) 100 MG tablet   Oral   Take 100 mg by mouth daily as needed for erectile dysfunction.         . Somatropin (OMNITROPE [place])   Subcutaneous   Inject 0.12 mLs into the skin as directed.         . testosterone cypionate (DEPOTESTOTERONE CYPIONATE) 100 MG/ML injection   Intramuscular   Inject 100 mg into the muscle every 7 (seven) days. For IM use only         . thyroid (ARMOUR) 60 MG tablet   Oral   Take 60 mg by mouth 2 (two) times daily.         . vitamin C (ASCORBIC ACID) 500 MG tablet   Oral   Take 500 mg by mouth daily.         . vitamin E 400 UNIT capsule   Oral   Take 400 Units by mouth daily.         . clopidogrel (PLAVIX) 75 MG tablet   Oral   Take 75 mg by mouth daily before breakfast.          . tamsulosin (FLOMAX) 0.4 MG CAPS   Oral   Take 1 capsule (0.4 mg total) by mouth daily.   7 capsule   0   . traMADol (ULTRAM) 50 MG tablet   Oral   Take 1 tablet (50 mg total) by mouth every 6 (six) hours as needed for pain.   15 tablet   0  BP 124/76  Pulse 70  Temp(Src) 98.3 F (36.8 C) (Oral)  Resp 20  Ht 6\' 2"  (1.88 m)  Wt 200 lb (90.719 kg)  BMI 25.67 kg/m2  SpO2 97%  Physical Exam  Constitutional: He is oriented to person, place, and time. He appears well-developed and well-nourished. He appears distressed.  Uncomfortable appearing lying in the bed  HENT:  Head: Normocephalic and atraumatic.  Right Ear: External ear normal.  Left Ear: External ear normal.  Mouth/Throat: Oropharynx is clear and  moist.  Eyes: Conjunctivae and EOM are normal. Pupils are equal, round, and reactive to light. Right eye exhibits no discharge.  Neck: Normal range of motion. Neck supple.  Cardiovascular: Normal rate, regular rhythm, normal heart sounds and intact distal pulses.   No murmur heard. Pulmonary/Chest: Tachypnea noted. No respiratory distress. He has no decreased breath sounds. He has no wheezes. He has no rhonchi. He has no rales.  Abdominal: Soft. Normal appearance. There is tenderness in the suprapubic area. There is no CVA tenderness.  Musculoskeletal: Normal range of motion. He exhibits no edema and no tenderness.  Neurological: He is alert and oriented to person, place, and time.  Skin: Skin is warm and dry. No rash noted. He is not diaphoretic. No pallor.  Psychiatric: He has a normal mood and affect.    ED Course  Procedures (including critical care time)  Labs Reviewed - No data to display No results found.   1. Hematuria   2. Obstructed Foley catheter, initial encounter       MDM   Patient presenting today do to worsening hematuria and clotting of his Foley catheter. 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 He has had a cystoscopy and cauterization of the prostate which seems to be the area that is causing the bleeding with no improvement. He has stopped his Plavix on Monday when the bleeding started. Since 11 AM he has not had drainage from his Foley catheter despite multiple flushes. He denies any other symptoms except for a low-grade temperature of 99.  On Monday when he had a Foley catheter placed for urinary retention he was started on doxycycline and Keflex and has only taken 2 doses.  Will remove Foley catheter and if we have a three-way Foley will put in and irritate to clean out the clot if not we'll place a new catheter and attempt to flush.  7:04 PM Through a Foley placed with a small clot and sediment initially and then after 1 L urine is clear.  Minimally displaced and will followup with urology on Friday     Gwyneth Sprout, MD 08/28/12 1904  Gwyneth Sprout, MD 08/28/12 [phone]

## 2012-08-28 NOTE — ED Notes (Signed)
Irrigation of foley catheter after insertion of 3 way catheter.  800 cc ns instilled with minimal effort in increments of 50 cc.  900 cc clear to lightly pink tinged urine returned.  Tolerated well.

## 2012-08-28 NOTE — ED Notes (Signed)
Foley place on Monday- states catheter clogged, unable to flush

## 2012-08-29 DIAGNOSIS — R310 Gross hematuria: Secondary | ICD-10-CM | POA: Diagnosis not present

## 2012-08-29 DIAGNOSIS — N401 Enlarged prostate with lower urinary tract symptoms: Secondary | ICD-10-CM | POA: Diagnosis not present

## 2012-08-30 DIAGNOSIS — N401 Enlarged prostate with lower urinary tract symptoms: Secondary | ICD-10-CM | POA: Diagnosis not present

## 2012-09-05 DIAGNOSIS — I8310 Varicose veins of unspecified lower extremity with inflammation: Secondary | ICD-10-CM | POA: Diagnosis not present

## 2012-09-06 DIAGNOSIS — R82998 Other abnormal findings in urine: Secondary | ICD-10-CM | POA: Diagnosis not present

## 2012-09-06 DIAGNOSIS — N401 Enlarged prostate with lower urinary tract symptoms: Secondary | ICD-10-CM | POA: Diagnosis not present

## 2012-09-17 DIAGNOSIS — I8310 Varicose veins of unspecified lower extremity with inflammation: Secondary | ICD-10-CM | POA: Diagnosis not present

## 2012-09-26 DIAGNOSIS — I8310 Varicose veins of unspecified lower extremity with inflammation: Secondary | ICD-10-CM | POA: Diagnosis not present

## 2012-10-02 DIAGNOSIS — M79609 Pain in unspecified limb: Secondary | ICD-10-CM | POA: Diagnosis not present

## 2012-10-02 DIAGNOSIS — I8310 Varicose veins of unspecified lower extremity with inflammation: Secondary | ICD-10-CM | POA: Diagnosis not present

## 2012-10-11 DIAGNOSIS — E782 Mixed hyperlipidemia: Secondary | ICD-10-CM | POA: Diagnosis not present

## 2012-10-11 DIAGNOSIS — I10 Essential (primary) hypertension: Secondary | ICD-10-CM | POA: Diagnosis not present

## 2012-10-11 DIAGNOSIS — I2510 Atherosclerotic heart disease of native coronary artery without angina pectoris: Secondary | ICD-10-CM | POA: Diagnosis not present

## 2012-10-16 DIAGNOSIS — I8310 Varicose veins of unspecified lower extremity with inflammation: Secondary | ICD-10-CM | POA: Diagnosis not present

## 2012-10-16 DIAGNOSIS — M79609 Pain in unspecified limb: Secondary | ICD-10-CM | POA: Diagnosis not present

## 2012-11-06 DIAGNOSIS — M7981 Nontraumatic hematoma of soft tissue: Secondary | ICD-10-CM | POA: Diagnosis not present

## 2012-11-06 DIAGNOSIS — I8310 Varicose veins of unspecified lower extremity with inflammation: Secondary | ICD-10-CM | POA: Diagnosis not present

## 2012-11-06 DIAGNOSIS — M79609 Pain in unspecified limb: Secondary | ICD-10-CM | POA: Diagnosis not present

## 2012-11-20 DIAGNOSIS — M7981 Nontraumatic hematoma of soft tissue: Secondary | ICD-10-CM | POA: Diagnosis not present

## 2012-11-20 DIAGNOSIS — M79609 Pain in unspecified limb: Secondary | ICD-10-CM | POA: Diagnosis not present

## 2012-11-20 DIAGNOSIS — I8310 Varicose veins of unspecified lower extremity with inflammation: Secondary | ICD-10-CM | POA: Diagnosis not present

## 2012-12-03 DIAGNOSIS — M549 Dorsalgia, unspecified: Secondary | ICD-10-CM | POA: Diagnosis not present

## 2012-12-03 DIAGNOSIS — R82998 Other abnormal findings in urine: Secondary | ICD-10-CM | POA: Diagnosis not present

## 2012-12-16 DIAGNOSIS — I8310 Varicose veins of unspecified lower extremity with inflammation: Secondary | ICD-10-CM | POA: Diagnosis not present

## 2012-12-16 DIAGNOSIS — M79609 Pain in unspecified limb: Secondary | ICD-10-CM | POA: Diagnosis not present

## 2012-12-16 DIAGNOSIS — M7981 Nontraumatic hematoma of soft tissue: Secondary | ICD-10-CM | POA: Diagnosis not present

## 2013-01-24 DIAGNOSIS — R21 Rash and other nonspecific skin eruption: Secondary | ICD-10-CM | POA: Diagnosis not present

## 2013-01-24 DIAGNOSIS — B359 Dermatophytosis, unspecified: Secondary | ICD-10-CM | POA: Diagnosis not present

## 2013-03-20 DIAGNOSIS — Z23 Encounter for immunization: Secondary | ICD-10-CM | POA: Diagnosis not present

## 2013-06-23 DIAGNOSIS — R059 Cough, unspecified: Secondary | ICD-10-CM | POA: Diagnosis not present

## 2013-06-23 DIAGNOSIS — R05 Cough: Secondary | ICD-10-CM | POA: Diagnosis not present

## 2013-06-25 DIAGNOSIS — E7800 Pure hypercholesterolemia, unspecified: Secondary | ICD-10-CM | POA: Diagnosis not present

## 2013-06-25 DIAGNOSIS — I10 Essential (primary) hypertension: Secondary | ICD-10-CM | POA: Diagnosis not present

## 2013-06-25 DIAGNOSIS — Z125 Encounter for screening for malignant neoplasm of prostate: Secondary | ICD-10-CM | POA: Diagnosis not present

## 2013-07-10 DIAGNOSIS — Z0000 Encounter for general adult medical examination without abnormal findings: Secondary | ICD-10-CM | POA: Diagnosis not present

## 2013-07-10 DIAGNOSIS — Z1211 Encounter for screening for malignant neoplasm of colon: Secondary | ICD-10-CM | POA: Diagnosis not present

## 2013-07-10 DIAGNOSIS — Z23 Encounter for immunization: Secondary | ICD-10-CM | POA: Diagnosis not present

## 2013-07-10 DIAGNOSIS — I10 Essential (primary) hypertension: Secondary | ICD-10-CM | POA: Diagnosis not present

## 2013-08-01 DIAGNOSIS — R059 Cough, unspecified: Secondary | ICD-10-CM | POA: Diagnosis not present

## 2013-08-01 DIAGNOSIS — R05 Cough: Secondary | ICD-10-CM | POA: Diagnosis not present

## 2013-08-01 DIAGNOSIS — J45909 Unspecified asthma, uncomplicated: Secondary | ICD-10-CM | POA: Diagnosis not present

## 2013-08-01 DIAGNOSIS — J988 Other specified respiratory disorders: Secondary | ICD-10-CM | POA: Diagnosis not present

## 2013-08-04 DIAGNOSIS — K219 Gastro-esophageal reflux disease without esophagitis: Secondary | ICD-10-CM | POA: Diagnosis not present

## 2013-08-04 DIAGNOSIS — Z86010 Personal history of colonic polyps: Secondary | ICD-10-CM | POA: Diagnosis not present

## 2013-08-04 DIAGNOSIS — K2270 Barrett's esophagus without dysplasia: Secondary | ICD-10-CM | POA: Diagnosis not present

## 2013-08-18 ENCOUNTER — Other Ambulatory Visit: Payer: Self-pay | Admitting: Gastroenterology

## 2013-08-18 DIAGNOSIS — Z09 Encounter for follow-up examination after completed treatment for conditions other than malignant neoplasm: Secondary | ICD-10-CM | POA: Diagnosis not present

## 2013-08-18 DIAGNOSIS — K620 Anal polyp: Secondary | ICD-10-CM | POA: Diagnosis not present

## 2013-08-18 DIAGNOSIS — K222 Esophageal obstruction: Secondary | ICD-10-CM | POA: Diagnosis not present

## 2013-08-18 DIAGNOSIS — K621 Rectal polyp: Secondary | ICD-10-CM | POA: Diagnosis not present

## 2013-08-18 DIAGNOSIS — D126 Benign neoplasm of colon, unspecified: Secondary | ICD-10-CM | POA: Diagnosis not present

## 2013-08-18 DIAGNOSIS — K2270 Barrett's esophagus without dysplasia: Secondary | ICD-10-CM | POA: Diagnosis not present

## 2013-08-18 DIAGNOSIS — D131 Benign neoplasm of stomach: Secondary | ICD-10-CM | POA: Diagnosis not present

## 2013-08-18 DIAGNOSIS — K5730 Diverticulosis of large intestine without perforation or abscess without bleeding: Secondary | ICD-10-CM | POA: Diagnosis not present

## 2013-08-18 DIAGNOSIS — Z86010 Personal history of colonic polyps: Secondary | ICD-10-CM | POA: Diagnosis not present

## 2013-08-18 DIAGNOSIS — K219 Gastro-esophageal reflux disease without esophagitis: Secondary | ICD-10-CM | POA: Diagnosis not present

## 2013-08-26 DIAGNOSIS — K219 Gastro-esophageal reflux disease without esophagitis: Secondary | ICD-10-CM | POA: Diagnosis not present

## 2013-08-26 DIAGNOSIS — Z79899 Other long term (current) drug therapy: Secondary | ICD-10-CM | POA: Diagnosis not present

## 2013-10-01 DIAGNOSIS — L723 Sebaceous cyst: Secondary | ICD-10-CM | POA: Diagnosis not present

## 2013-11-13 ENCOUNTER — Other Ambulatory Visit: Payer: Self-pay | Admitting: Dermatology

## 2013-11-13 DIAGNOSIS — L723 Sebaceous cyst: Secondary | ICD-10-CM | POA: Diagnosis not present

## 2014-02-12 DIAGNOSIS — N410 Acute prostatitis: Secondary | ICD-10-CM | POA: Diagnosis not present

## 2014-02-17 DIAGNOSIS — M1710 Unilateral primary osteoarthritis, unspecified knee: Secondary | ICD-10-CM | POA: Diagnosis not present

## 2014-04-17 ENCOUNTER — Ambulatory Visit: Payer: Medicare Other | Admitting: Cardiovascular Disease

## 2014-04-21 DIAGNOSIS — W19XXXA Unspecified fall, initial encounter: Secondary | ICD-10-CM | POA: Diagnosis not present

## 2014-04-21 DIAGNOSIS — S20212A Contusion of left front wall of thorax, initial encounter: Secondary | ICD-10-CM | POA: Diagnosis not present

## 2014-04-21 DIAGNOSIS — S022XXA Fracture of nasal bones, initial encounter for closed fracture: Secondary | ICD-10-CM | POA: Diagnosis not present

## 2014-04-21 DIAGNOSIS — S6992XA Unspecified injury of left wrist, hand and finger(s), initial encounter: Secondary | ICD-10-CM | POA: Diagnosis not present

## 2014-04-24 DIAGNOSIS — M9903 Segmental and somatic dysfunction of lumbar region: Secondary | ICD-10-CM | POA: Diagnosis not present

## 2014-04-24 DIAGNOSIS — M545 Low back pain: Secondary | ICD-10-CM | POA: Diagnosis not present

## 2014-04-24 DIAGNOSIS — S332XXA Dislocation of sacroiliac and sacrococcygeal joint, initial encounter: Secondary | ICD-10-CM | POA: Diagnosis not present

## 2014-04-27 DIAGNOSIS — S332XXA Dislocation of sacroiliac and sacrococcygeal joint, initial encounter: Secondary | ICD-10-CM | POA: Diagnosis not present

## 2014-04-27 DIAGNOSIS — M9903 Segmental and somatic dysfunction of lumbar region: Secondary | ICD-10-CM | POA: Diagnosis not present

## 2014-04-27 DIAGNOSIS — M545 Low back pain: Secondary | ICD-10-CM | POA: Diagnosis not present

## 2014-04-29 DIAGNOSIS — M545 Low back pain: Secondary | ICD-10-CM | POA: Diagnosis not present

## 2014-04-29 DIAGNOSIS — M9903 Segmental and somatic dysfunction of lumbar region: Secondary | ICD-10-CM | POA: Diagnosis not present

## 2014-04-29 DIAGNOSIS — S332XXA Dislocation of sacroiliac and sacrococcygeal joint, initial encounter: Secondary | ICD-10-CM | POA: Diagnosis not present

## 2014-05-05 DIAGNOSIS — M9903 Segmental and somatic dysfunction of lumbar region: Secondary | ICD-10-CM | POA: Diagnosis not present

## 2014-05-05 DIAGNOSIS — M1711 Unilateral primary osteoarthritis, right knee: Secondary | ICD-10-CM | POA: Diagnosis not present

## 2014-05-05 DIAGNOSIS — S332XXA Dislocation of sacroiliac and sacrococcygeal joint, initial encounter: Secondary | ICD-10-CM | POA: Diagnosis not present

## 2014-05-05 DIAGNOSIS — M545 Low back pain: Secondary | ICD-10-CM | POA: Diagnosis not present

## 2014-05-12 DIAGNOSIS — S62667D Nondisplaced fracture of distal phalanx of left little finger, subsequent encounter for fracture with routine healing: Secondary | ICD-10-CM | POA: Diagnosis not present

## 2014-07-03 DIAGNOSIS — R05 Cough: Secondary | ICD-10-CM | POA: Diagnosis not present

## 2014-07-03 DIAGNOSIS — J019 Acute sinusitis, unspecified: Secondary | ICD-10-CM | POA: Diagnosis not present

## 2014-07-10 ENCOUNTER — Encounter: Payer: Self-pay | Admitting: Cardiovascular Disease

## 2014-07-10 ENCOUNTER — Ambulatory Visit (INDEPENDENT_AMBULATORY_CARE_PROVIDER_SITE_OTHER): Payer: Medicare Other | Admitting: Cardiovascular Disease

## 2014-07-10 VITALS — BP 134/80 | HR 77 | Ht 73.0 in | Wt 217.7 lb

## 2014-07-10 DIAGNOSIS — E785 Hyperlipidemia, unspecified: Secondary | ICD-10-CM | POA: Diagnosis not present

## 2014-07-10 DIAGNOSIS — I10 Essential (primary) hypertension: Secondary | ICD-10-CM

## 2014-07-10 DIAGNOSIS — Z01818 Encounter for other preprocedural examination: Secondary | ICD-10-CM

## 2014-07-10 DIAGNOSIS — I2510 Atherosclerotic heart disease of native coronary artery without angina pectoris: Secondary | ICD-10-CM

## 2014-07-10 DIAGNOSIS — I2583 Coronary atherosclerosis due to lipid rich plaque: Secondary | ICD-10-CM

## 2014-07-10 NOTE — Assessment & Plan Note (Signed)
History of hypertension blood pressure measured at 134/80. He is on [place], and Micardis. Continue current meds at current dosing

## 2014-07-10 NOTE — Patient Instructions (Addendum)
Lexiscan Myoview (as soon as possible; need results prior to 07/29/14-needed for surgical clearance)- this is a test that looks at the blood flow to your heart muscle.  It takes approximately 2 1/2 hours. Please follow instruction sheet, as given.   Your physician wants you to follow-up in 1 year with Dr. [name]. You will receive a reminder letter in the mail 2 months in advance. If you do not receive a letter, please call our office to schedule the follow-up appointment.  Please make an appointment with Erasmo Downer, PharmD the week of Feb 22. This will be a 30 minute Lipid Check appointment.

## 2014-07-10 NOTE — Progress Notes (Signed)
07/10/2014 [name] Glass   13-Sep-1940  [id number]  Primary Physician FRIED, Jaymes Graff, MD Primary Cardiologist: [name] MD Renae Gloss   HPI:  [name] Glass is a 74 year old moderately overweight although fit-appearing divorced Caucasian male with no children who I last saw 10/11/12. He has a history of CAD status post circumflex and RCA stenting by myself 06/09/04. The palms include hypertension, hyperlipidemia and statin intolerance. He does drink Pinch blended scotch on a daily basis. He denies chest pain or shortness of breath. 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   Current Outpatient Prescriptions  Medication Sig Dispense Refill  . acyclovir (ZOVIRAX) 200 MG capsule   0  . anastrozole (ARIMIDEX) 1 MG tablet   0  . aspirin 81 MG tablet Take 81 mg by mouth daily.    [name] DHEA 25 MG CAPS Take 1 capsule by mouth 2 (two) times daily.    [name] ezetimibe (ZETIA) 10 MG tablet Take 10 mg by mouth every evening.    . fluticasone (FLONASE) 50 MCG/ACT nasal spray Place 1 spray into the nose daily.    [name] letrozole (FEMARA) 2.5 MG tablet Take 2.5 mg by mouth every other day.    . Multiple Vitamin (MULTIVITAMIN WITH MINERALS) TABS Take 1 tablet by mouth daily.    [name] NANDROLONE DECANOATE IM Inject 0.5 mLs into the muscle every 7 (seven) days.    . nisoldipine (SULAR) 20 MG 24 hr tablet Take 20 mg by mouth daily before breakfast.     . NON FORMULARY Take 1 tablet by mouth 2 (two) times daily. Ultra natural prostate    . NON FORMULARY Take 2 tablets by mouth 2 (two) times daily. Heart savior    . Omega-3 Fatty Acids (SUPER OMEGA 3) 500 MG CAPS Take 2 capsules by mouth 2 (two) times daily.    [name] omeprazole (PRILOSEC) 20 MG capsule Take 20 mg by mouth 2 (two) times daily.     [name] Plant Sterol Stanol-Pantethine (CHOLEST OFF COMPLETE PO) Take 2 tablets by mouth 2 (two) times daily.    . potassium gluconate 595 MG TABS Take  595 mg by mouth every evening.    . predniSONE (DELTASONE) 20 MG tablet   0  . progesterone (PROMETRIUM) 100 MG capsule Take 100 mg by mouth every evening.    . sildenafil (VIAGRA) 100 MG tablet Take 100 mg by mouth daily as needed for erectile dysfunction.    . Somatropin (OMNITROPE [place]) Inject 0.12 mLs into the skin as directed.    . telmisartan (MICARDIS) 80 MG tablet Take 80 mg by mouth daily.    [name] testosterone cypionate (DEPOTESTOTERONE CYPIONATE) 100 MG/ML injection Inject 100 mg into the muscle every 7 (seven) days. For IM use only    . thyroid (ARMOUR) 60 MG tablet Take 60 mg by mouth 2 (two) times daily.    . vitamin C (ASCORBIC ACID) 500 MG tablet Take 500 mg by mouth daily.    . vitamin E 400 UNIT capsule Take 400 Units by mouth daily.     No current facility-administered medications for this visit.    Allergies  Allergen Reactions  . Morphine And Related     Had "crazy dreams" felt "crazy" per pt.    History   Social History  . Marital Status: Divorced    Spouse Name: N/A    Number of Children: N/A  . Years  of Education: N/A   Occupational History  . Not on file.   Social History Main Topics  . Smoking status: Never Smoker   . Smokeless tobacco: Never Used  . Alcohol Use: Yes     Comment: 4-5 days a week  . Drug Use: No  . Sexual Activity: Not on file   Other Topics Concern  . Not on file   Social History Narrative     Review of Systems: General: negative for chills, fever, night sweats or weight changes.  Cardiovascular: negative for chest pain, dyspnea on exertion, edema, orthopnea, palpitations, paroxysmal nocturnal dyspnea or shortness of breath Dermatological: negative for rash Respiratory: negative for cough or wheezing Urologic: negative for hematuria Abdominal: negative for nausea, vomiting, diarrhea, bright red blood per rectum, melena, or hematemesis Neurologic: negative for visual changes, syncope, or dizziness All other systems reviewed and  are otherwise negative except as noted above.    Blood pressure 134/80, pulse 77, height 6\' 1"  (1.854 m), weight 217 lb 11.2 oz (98.748 kg).  General appearance: alert and no distress Neck: no adenopathy, no carotid bruit, no JVD, supple, symmetrical, trachea midline and thyroid not enlarged, symmetric, no tenderness/mass/nodules Lungs: clear to auscultation bilaterally Heart: regular rate and rhythm, S1, S2 normal, no murmur, click, rub or gallop Extremities: extremities normal, atraumatic, no cyanosis or edema  EKG normal sinus rhythm at 77 without ST or T-wave changes. I personally reviewed this EKG  ASSESSMENT AND PLAN:   Hyperlipidemia History of hyperlipidemia although statin intolerant. He is on Zetia. He says his LDL went in the 120 range. I am going to explore the possibility of treatment with PCSK9 monoclonal injectables.   Essential hypertension History of hypertension blood pressure measured at 134/80. He is on [place], and Micardis. Continue current meds at current dosing   Coronary artery disease History of CAD status post circumflex and RCA stenting by myself 06/09/04. His last stress test performed 01/18/11 was nonischemic. He denies chest pain or shortness of breath. He is scheduled for a right total knee replacement by Dr. Ricki Rodriguez on 08/03/14. I'm going to order a formal neurologic Myoview stress test to risk stratify him.       [name] MD FACP,FACC,FAHA, [facility] 07/10/2014 11:12 AM

## 2014-07-10 NOTE — Assessment & Plan Note (Signed)
History of hyperlipidemia although statin intolerant. He is on Zetia. He says his LDL went in the 120 range. I am going to explore the possibility of treatment with PCSK9 monoclonal injectables.

## 2014-07-10 NOTE — Assessment & Plan Note (Signed)
History of CAD status post circumflex and RCA stenting by myself 06/09/04. His last stress test performed 01/18/11 was nonischemic. He denies chest pain or shortness of breath. He is scheduled for a right total knee replacement by Dr. Ricki Rodriguez on 08/03/14. I'm going to order a formal neurologic Myoview stress test to risk stratify him.

## 2014-07-14 ENCOUNTER — Ambulatory Visit: Payer: Self-pay | Admitting: Orthopedic Surgery

## 2014-07-14 ENCOUNTER — Telehealth (HOSPITAL_COMMUNITY): Payer: Self-pay

## 2014-07-14 NOTE — Telephone Encounter (Signed)
Encounter complete. 

## 2014-07-14 NOTE — Progress Notes (Signed)
Preoperative surgical orders have been place into the Epic hospital system for [name] Glass on 07/14/2014, 11:14 AM  by Mickel Crow for surgery on 08-03-2014.  Preop Total Knee orders including Experal, IV Tylenol, and IV Decadron as long as there are no contraindications to the above medications. Arlee Muslim, PA-C

## 2014-07-15 DIAGNOSIS — I10 Essential (primary) hypertension: Secondary | ICD-10-CM | POA: Diagnosis not present

## 2014-07-15 DIAGNOSIS — E780 Pure hypercholesterolemia: Secondary | ICD-10-CM | POA: Diagnosis not present

## 2014-07-15 DIAGNOSIS — Z125 Encounter for screening for malignant neoplasm of prostate: Secondary | ICD-10-CM | POA: Diagnosis not present

## 2014-07-16 ENCOUNTER — Ambulatory Visit (HOSPITAL_COMMUNITY)
Admission: RE | Admit: 2014-07-16 | Discharge: 2014-07-16 | Disposition: A | Discharge status: Home / Self Care | Payer: Medicare Other | Source: Ambulatory Visit | Attending: Internal Medicine | Admitting: Internal Medicine

## 2014-07-16 DIAGNOSIS — I10 Essential (primary) hypertension: Secondary | ICD-10-CM | POA: Insufficient documentation

## 2014-07-16 DIAGNOSIS — E663 Overweight: Secondary | ICD-10-CM | POA: Insufficient documentation

## 2014-07-16 DIAGNOSIS — R0609 Other forms of dyspnea: Secondary | ICD-10-CM | POA: Diagnosis not present

## 2014-07-16 DIAGNOSIS — E785 Hyperlipidemia, unspecified: Secondary | ICD-10-CM | POA: Diagnosis not present

## 2014-07-16 DIAGNOSIS — I2510 Atherosclerotic heart disease of native coronary artery without angina pectoris: Secondary | ICD-10-CM

## 2014-07-16 DIAGNOSIS — Z01818 Encounter for other preprocedural examination: Secondary | ICD-10-CM

## 2014-07-16 DIAGNOSIS — Z8249 Family history of ischemic heart disease and other diseases of the circulatory system: Secondary | ICD-10-CM | POA: Diagnosis not present

## 2014-07-16 MED ORDER — TECHNETIUM TC 99M SESTAMIBI GENERIC - CARDIOLITE
9.9000 | Freq: Once | INTRAVENOUS | Status: AC | PRN
  Administered 2014-07-16: 10 via INTRAVENOUS

## 2014-07-16 MED ORDER — REGADENOSON 0.4 MG/5ML IV SOLN
0.4000 mg | Freq: Once | INTRAVENOUS | Status: AC
  Administered 2014-07-16: 0.4 mg via INTRAVENOUS

## 2014-07-16 MED ORDER — TECHNETIUM TC 99M SESTAMIBI GENERIC - CARDIOLITE
30.2000 | Freq: Once | INTRAVENOUS | Status: AC | PRN
  Administered 2014-07-16: 30.2 via INTRAVENOUS

## 2014-07-16 NOTE — Procedures (Addendum)
  [name] [address] [place] [address] [phone]  Cardiology Nuclear Med Study  [name] Glass is a 74 y.o. male     MRN : [id number]     DOB: 12/20/1940  Procedure Date: 07/16/2014  Nuclear Med Background Indication for Stress Test:  Surgical Clearance and Follow up CAD History:  COPD and CAD;STENT/PTCA x5;No prior NUC MPI for comparison in EPIC; Cardiac Risk Factors: Family History - CAD, Hypertension, Lipids and Overweight  Symptoms:  DOE   Nuclear Pre-Procedure Caffeine/Decaff Intake:  7:00pm NPO After: 5:00am   IV Site: R Forearm  IV 0.9% NS with Angio Cath:  22g  Chest Size (in):  48" IV Started by: Rolene Course, RN  Height: 6\' 1"  (1.854 m)  Cup Size: n/a  BMI:  Body mass index is 28.64 kg/(m^2). Weight:  217 lb (98.431 kg)   Tech Comments:  n/a    Nuclear Med Study 1 or 2 day study: 1 day  Stress Test Type:  [place] Provider:  Quay Burow, MD   Resting Radionuclide: Technetium 85m Sestamibi  Resting Radionuclide Dose: 9.9 mCi   Stress Radionuclide:  Technetium 61m Sestamibi  Stress Radionuclide Dose: 30.2 mCi           Stress Protocol Rest HR: 72 Stress HR: 90  Rest BP: 157/96 Stress BP: 159/91  Exercise Time (min): n/a METS: n/a   Predicted Max HR: 146 bpm % Max HR: 61.64 bpm Rate Pressure Product: 14310  Dose of Adenosine (mg):  n/a Dose of Lexiscan: 0.4 mg  Dose of Atropine (mg): n/a Dose of Dobutamine: n/a mcg/kg/min (at max HR)  Stress Test Technologist: Leane Para, CCT Nuclear Technologist: Imagene Riches, CNMT   Rest Procedure:  Myocardial perfusion imaging was performed at rest 45 minutes following the intravenous administration of Technetium 2m Sestamibi. Stress Procedure:  The patient received IV Lexiscan 0.4 mg over 15-seconds.  Technetium 67m Sestamibi injected IV at 30-seconds.  There were no significant changes with Lexiscan.  Quantitative spect images were  obtained after a 45 minute delay.  Transient Ischemic Dilatation (Normal <1.22):  1.40  QGS EDV:  136 ml QGS ESV:  70 ml LV Ejection Fraction: 48%       Rest ECG: NSR with non-specific ST-T wave changes  Stress ECG: No significant change from baseline ECG  QPS Raw Data Images:  Normal; no motion artifact; normal heart/lung ratio. Stress Images:  There is decreased uptake in the inferior wall. Rest Images:  There is decreased uptake in the inferior wall. Subtraction (SDS):  There is a fixed inferior defect that is most consistent with diaphragmatic attenuation.  Impression Exercise Capacity:  Lexiscan with no exercise. BP Response:  Normal blood pressure response. Clinical Symptoms:  No significant symptoms noted. ECG Impression:  No significant ST segment change suggestive of ischemia. Comparison with Prior Nuclear Study: No images to compare  Overall Impression:  Low risk stress nuclear study Diaphragmatic attenuation.  LV Wall Motion:  Mild global hypokinesis   [name], MD  07/16/2014 3:44 PM

## 2014-07-17 ENCOUNTER — Telehealth: Payer: Self-pay | Admitting: *Deleted

## 2014-07-17 NOTE — Telephone Encounter (Signed)
Cleared for his upcoming procedure low risk, negative Myoview stress test

## 2014-07-17 NOTE — Telephone Encounter (Signed)
-----   Message from [name], MD sent at 07/16/2014  4:36 PM EST ----- Low risk Myoview, diaphragmatic attenuation, mild LV dysfunction

## 2014-07-17 NOTE — Telephone Encounter (Signed)
Pt needs clearance for upcoming procedure.

## 2014-07-20 DIAGNOSIS — K219 Gastro-esophageal reflux disease without esophagitis: Secondary | ICD-10-CM | POA: Diagnosis not present

## 2014-07-20 DIAGNOSIS — I10 Essential (primary) hypertension: Secondary | ICD-10-CM | POA: Diagnosis not present

## 2014-07-20 DIAGNOSIS — I2510 Atherosclerotic heart disease of native coronary artery without angina pectoris: Secondary | ICD-10-CM | POA: Diagnosis not present

## 2014-07-20 DIAGNOSIS — R05 Cough: Secondary | ICD-10-CM | POA: Diagnosis not present

## 2014-07-20 DIAGNOSIS — J309 Allergic rhinitis, unspecified: Secondary | ICD-10-CM | POA: Diagnosis not present

## 2014-07-20 DIAGNOSIS — Z0001 Encounter for general adult medical examination with abnormal findings: Secondary | ICD-10-CM | POA: Diagnosis not present

## 2014-07-20 DIAGNOSIS — Z01818 Encounter for other preprocedural examination: Secondary | ICD-10-CM | POA: Diagnosis not present

## 2014-07-20 DIAGNOSIS — M1711 Unilateral primary osteoarthritis, right knee: Secondary | ICD-10-CM | POA: Diagnosis not present

## 2014-07-20 DIAGNOSIS — E780 Pure hypercholesterolemia: Secondary | ICD-10-CM | POA: Diagnosis not present

## 2014-07-21 NOTE — Telephone Encounter (Signed)
Letter sent  Pt notified

## 2014-07-27 ENCOUNTER — Other Ambulatory Visit (HOSPITAL_COMMUNITY): Payer: Self-pay | Admitting: *Deleted

## 2014-07-27 NOTE — Patient Instructions (Addendum)
[name] Glass  07/27/2014   Your procedure is scheduled on: Monday March 29th, 2016  Report to [facility] Main  Entrance and follow signs to               [place] at 940 AM.  Call this number if you have problems the morning of surgery [phone]   Remember:  Do not eat food or drink liquids :After Midnight.     Take these medicines the morning of surgery with A SIP OF WATER: thyroid, prilosec, sular, flonase                               You may not have any metal on your body including hair pins and              piercings  Do not wear jewelry, make-up, lotions, powders or perfumes.             Do not wear nail polish.  Do not shave  48 hours prior to surgery.              Men may shave face and neck.  Do not bring valuables to the hospital. [place].  Contacts, dentures or bridgework may not be worn into surgery.  Leave suitcase in the car. After surgery it may be brought to your room.               Please read over the following fact sheets you were given: MRSA information  _____________________________________________________________________             [facility] - Preparing for Surgery Before surgery, you can play an important role.  Because skin is not sterile, your skin needs to be as free of germs as possible.  You can reduce the number of germs on your skin by washing with CHG (chlorahexidine gluconate) soap before surgery.  CHG is an antiseptic cleaner which kills germs and bonds with the skin to continue killing germs even after washing. Please DO NOT use if you have an allergy to CHG or antibacterial soaps.  If your skin becomes reddened/irritated stop using the CHG and inform your nurse when you arrive at Short Stay. Do not shave (including legs and underarms) for at least 48 hours prior to the first CHG shower.  You may shave your face/neck. Please follow these instructions  carefully:  1.  Shower with CHG Soap the night before surgery and the  morning of Surgery.  2.  If you choose to wash your hair, wash your hair first as usual with your  normal  shampoo.  3.  After you shampoo, rinse your hair and body thoroughly to remove the  shampoo.                            4.  Use CHG as you would any other liquid soap.  You can apply chg directly  to the skin and wash                       Gently with a scrungie or clean washcloth.  5.  Apply the CHG Soap to your body ONLY FROM THE NECK  DOWN.   Do not use on face/ open                           Wound or open sores. Avoid contact with eyes, ears mouth and genitals (private parts).                       Wash face,  Genitals (private parts) with your normal soap.             6.  Wash thoroughly, paying special attention to the area where your surgery  will be performed.  7.  Thoroughly rinse your body with warm water from the neck down.  8.  DO NOT shower/wash with your normal soap after using and rinsing off  the CHG Soap.                9.  Pat yourself dry with a clean towel.            10.  Wear clean pajamas.            11.  Place clean sheets on your bed the night of your first shower and do not  sleep with pets. Day of Surgery : Do not apply any lotions/deodorants the morning of surgery.  Please wear clean clothes to the hospital/surgery center.  FAILURE TO FOLLOW THESE INSTRUCTIONS MAY RESULT IN THE CANCELLATION OF YOUR SURGERY PATIENT SIGNATURE_________________________________  NURSE SIGNATURE__________________________________  ________________________________________________________________________   [name] Glass  An incentive spirometer is a tool that can help keep your lungs clear and active. This tool measures how well you are filling your lungs with each breath. Taking long deep breaths may help reverse or decrease the chance of developing breathing (pulmonary) problems (especially infection)  following:  A long period of time when you are unable to move or be active. BEFORE THE PROCEDURE   If the spirometer includes an indicator to show your best effort, your nurse or respiratory therapist will set it to a desired goal.  If possible, sit up straight or lean slightly forward. Try not to slouch.  Hold the incentive spirometer in an upright position. INSTRUCTIONS FOR USE   Sit on the edge of your bed if possible, or sit up as far as you can in bed or on a chair.  Hold the incentive spirometer in an upright position.  Breathe out normally.  Place the mouthpiece in your mouth and seal your lips tightly around it.  Breathe in slowly and as deeply as possible, raising the piston or the ball toward the top of the column.  Hold your breath for 3-5 seconds or for as long as possible. Allow the piston or ball to fall to the bottom of the column.  Remove the mouthpiece from your mouth and breathe out normally.  Rest for a few seconds and repeat Steps 1 through 7 at least 10 times every 1-2 hours when you are awake. Take your time and take a few normal breaths between deep breaths.  The spirometer may include an indicator to show your best effort. Use the indicator as a goal to work toward during each repetition.  After each set of 10 deep breaths, practice coughing to be sure your lungs are clear. If you have an incision (the cut made at the time of surgery), support your incision when coughing by placing a pillow or rolled up towels firmly against it. Once you are able to  get out of bed, walk around indoors and cough well. You may stop using the incentive spirometer when instructed by your caregiver.  RISKS AND COMPLICATIONS  Take your time so you do not get dizzy or light-headed.  If you are in pain, you may need to take or ask for pain medication before doing incentive spirometry. It is harder to take a deep breath if you are having pain. AFTER USE  Rest and breathe slowly  and easily.  It can be helpful to keep track of a log of your progress. Your caregiver can provide you with a simple table to help with this. If you are using the spirometer at home, follow these instructions: [place] IF:   You are having difficultly using the spirometer.  You have trouble using the spirometer as often as instructed.  Your pain medication is not giving enough relief while using the spirometer.  You develop fever of 100.5 F (38.1 C) or higher. SEEK IMMEDIATE MEDICAL CARE IF:   You cough up bloody sputum that had not been present before.  You develop fever of 102 F (38.9 C) or greater.  You develop worsening pain at or near the incision site. MAKE SURE YOU:   Understand these instructions.  Will watch your condition.  Will get help right away if you are not doing well or get worse. Document Released: 10/02/2006 Document Revised: 08/14/2011 Document Reviewed: 12/03/2006 ExitCare Patient Information 2014 Exit[employer], [place].   ________________________________________________________________________  WHAT IS A BLOOD TRANSFUSION? Blood Transfusion Information  A transfusion is the replacement of blood or some of its parts. Blood is made up of multiple cells which provide different functions.  Red blood cells carry oxygen and are used for blood loss replacement.  White blood cells fight against infection.  Platelets control bleeding.  Plasma helps clot blood.  Other blood products are available for specialized needs, such as hemophilia or other clotting disorders. BEFORE THE TRANSFUSION  Who gives blood for transfusions?   Healthy volunteers who are fully evaluated to make sure their blood is safe. This is blood bank blood. Transfusion therapy is the safest it has ever been in the practice of medicine. Before blood is taken from a donor, a complete history is taken to make sure that person has no history of diseases nor engages in risky social  behavior (examples are intravenous drug use or sexual activity with multiple partners). The donor's travel history is screened to minimize risk of transmitting infections, such as malaria. The donated blood is tested for signs of infectious diseases, such as HIV and hepatitis. The blood is then tested to be sure it is compatible with you in order to minimize the chance of a transfusion reaction. If you or a relative donates blood, this is often done in anticipation of surgery and is not appropriate for emergency situations. It takes many days to process the donated blood. RISKS AND COMPLICATIONS Although transfusion therapy is very safe and saves many lives, the main dangers of transfusion include:   Getting an infectious disease.  Developing a transfusion reaction. This is an allergic reaction to something in the blood you were given. Every precaution is taken to prevent this. The decision to have a blood transfusion has been considered carefully by your caregiver before blood is given. Blood is not given unless the benefits outweigh the risks. AFTER THE TRANSFUSION  Right after receiving a blood transfusion, you will usually feel much better and more energetic. This is especially true if  your red blood cells have gotten low (anemic). The transfusion raises the level of the red blood cells which carry oxygen, and this usually causes an energy increase.  The nurse administering the transfusion will monitor you carefully for complications. HOME CARE INSTRUCTIONS  No special instructions are needed after a transfusion. You may find your energy is better. Speak with your caregiver about any limitations on activity for underlying diseases you may have. SEEK MEDICAL CARE IF:   Your condition is not improving after your transfusion.  You develop redness or irritation at the intravenous (IV) site. SEEK IMMEDIATE MEDICAL CARE IF:  Any of the following symptoms occur over the next 12 hours:  Shaking  chills.  You have a temperature by mouth above 102 F (38.9 C), not controlled by medicine.  Chest, back, or muscle pain.  People around you feel you are not acting correctly or are confused.  Shortness of breath or difficulty breathing.  Dizziness and fainting.  You get a rash or develop hives.  You have a decrease in urine output.  Your urine turns a dark color or changes to pink, red, or brown. Any of the following symptoms occur over the next 10 days:  You have a temperature by mouth above 102 F (38.9 C), not controlled by medicine.  Shortness of breath.  Weakness after normal activity.  The white part of the eye turns yellow (jaundice).  You have a decrease in the amount of urine or are urinating less often.  Your urine turns a dark color or changes to pink, red, or brown. Document Released: 05/19/2000 Document Revised: 08/14/2011 Document Reviewed: 01/06/2008 [facility] Patient Information 2014 [place], [place].  _______________________________________________________________________

## 2014-07-28 ENCOUNTER — Encounter (HOSPITAL_COMMUNITY)
Admission: RE | Admit: 2014-07-28 | Discharge: 2014-07-28 | Disposition: A | Discharge status: Home / Self Care | Payer: Medicare Other | Source: Ambulatory Visit | Attending: Orthopedic Surgery | Admitting: Orthopedic Surgery

## 2014-07-28 ENCOUNTER — Encounter (HOSPITAL_COMMUNITY): Payer: Self-pay

## 2014-07-28 DIAGNOSIS — M179 Osteoarthritis of knee, unspecified: Secondary | ICD-10-CM | POA: Diagnosis not present

## 2014-07-28 DIAGNOSIS — Z01818 Encounter for other preprocedural examination: Secondary | ICD-10-CM | POA: Insufficient documentation

## 2014-07-28 HISTORY — DX: Personal history of urinary calculi: Z87.442

## 2014-07-28 HISTORY — DX: Personal history of other diseases of the respiratory system: Z87.09

## 2014-07-28 LAB — CBC
HCT: 52.6 % — ABNORMAL HIGH (ref 39.0–52.0)
HEMOGLOBIN: 17.6 g/dL — AB (ref 13.0–17.0)
MCH: 30.7 pg (ref 26.0–34.0)
MCHC: 33.5 g/dL (ref 30.0–36.0)
MCV: 91.8 fL (ref 78.0–100.0)
PLATELETS: 254 10*3/uL (ref 150–400)
RBC: 5.73 MIL/uL (ref 4.22–5.81)
RDW: 18.5 % — AB (ref 11.5–15.5)
WBC: 6.1 10*3/uL (ref 4.0–10.5)

## 2014-07-28 LAB — URINALYSIS, ROUTINE W REFLEX MICROSCOPIC
Bilirubin Urine: NEGATIVE
Glucose, UA: NEGATIVE mg/dL
Hgb urine dipstick: NEGATIVE
Ketones, ur: NEGATIVE mg/dL
NITRITE: NEGATIVE
PROTEIN: 30 mg/dL — AB
SPECIFIC GRAVITY, URINE: 1.022 (ref 1.005–1.030)
Urobilinogen, UA: 0.2 mg/dL (ref 0.0–1.0)
pH: 5.5 (ref 5.0–8.0)

## 2014-07-28 LAB — COMPREHENSIVE METABOLIC PANEL
ALT: 27 U/L (ref 0–53)
AST: 27 U/L (ref 0–37)
Albumin: 3.8 g/dL (ref 3.5–5.2)
Alkaline Phosphatase: 54 U/L (ref 39–117)
Anion gap: 9 (ref 5–15)
BUN: 25 mg/dL — ABNORMAL HIGH (ref 6–23)
CO2: 25 mmol/L (ref 19–32)
CREATININE: 1.21 mg/dL (ref 0.50–1.35)
Calcium: 9.8 mg/dL (ref 8.4–10.5)
Chloride: 106 mmol/L (ref 96–112)
GFR, EST AFRICAN AMERICAN: 66 mL/min — AB (ref >90)
GFR, EST NON AFRICAN AMERICAN: 57 mL/min — AB (ref >90)
GLUCOSE: 87 mg/dL (ref 70–99)
Potassium: 4.4 mmol/L (ref 3.5–5.1)
SODIUM: 140 mmol/L (ref 135–145)
Total Bilirubin: 0.8 mg/dL (ref 0.3–1.2)
Total Protein: 7.1 g/dL (ref 6.0–8.3)

## 2014-07-28 LAB — APTT: aPTT: 30 seconds (ref 24–37)

## 2014-07-28 LAB — SURGICAL PCR SCREEN
MRSA, PCR: NEGATIVE
Staphylococcus aureus: NEGATIVE

## 2014-07-28 LAB — PROTIME-INR
INR: 0.96 (ref 0.00–1.49)
Prothrombin Time: 12.9 seconds (ref 11.6–15.2)

## 2014-07-28 LAB — URINE MICROSCOPIC-ADD ON

## 2014-07-28 NOTE — Progress Notes (Addendum)
Cardiac clearance note 07/17/14 on chart, Chest x-ray 04/21/14 on chart, CBC with diff results 07/15/14 on chart, CMET results 07/15/14 on chart, EKG 07/20/14 on EPIC, LOV Dr. [name] 07/10/14 on EPIC, Stress test 07/16/14 on EPIC

## 2014-07-28 NOTE — Progress Notes (Signed)
U/A with micro results done 07/28/14 faxed via EPIc to Dr Wynelle Link.

## 2014-07-28 NOTE — Progress Notes (Signed)
CMP done 07/28/14 faxed via EPIC to Dr Wynelle Link.

## 2014-07-31 ENCOUNTER — Ambulatory Visit (INDEPENDENT_AMBULATORY_CARE_PROVIDER_SITE_OTHER): Payer: Medicare Other | Admitting: Pharmacist Clinician (PhC)/ Clinical Pharmacy Specialist

## 2014-07-31 ENCOUNTER — Encounter: Payer: Self-pay | Admitting: Pharmacist Clinician (PhC)/ Clinical Pharmacy Specialist

## 2014-07-31 VITALS — Ht 73.0 in | Wt 220.0 lb

## 2014-07-31 DIAGNOSIS — E785 Hyperlipidemia, unspecified: Secondary | ICD-10-CM | POA: Diagnosis not present

## 2014-07-31 DIAGNOSIS — I2510 Atherosclerotic heart disease of native coronary artery without angina pectoris: Secondary | ICD-10-CM | POA: Diagnosis not present

## 2014-07-31 NOTE — Progress Notes (Signed)
07/31/2014 [name] Glass February 02, 1941 [id number]   HPI:  [name] Glass is a 74 y.o. male patient of Dr [name], who presents today for a lipid clinic evaluation.  RF:  CAD with stenting x 5 in 2006 (age 87)  Meds: zetia 10 mg, Cholestoff - 2 tabs daily, Heart Savior (includes CoQ10, red yeast rice, niacin, selenium, plant sterols and stanols)    Tried in the past: atorvastatin 40, pravastatin, simvastatin, rosuvastatin - all led to pains in joints of arms and knees   Family history: father had MI at age 86, died just weeks shy of 104.    Diet: follows a low carb diet   Exercise: usually eliptical 2-3 days/week (30 min) plus weights, has not for past 2 months due to bronchitis, upcoming TKA next week    Labs:  Drawn at [facility], will call for copies   Current Outpatient Prescriptions  Medication Sig Dispense Refill  . acetaminophen (TYLENOL) 650 MG CR tablet Take 1,300 mg by mouth at bedtime.    [name] acidophilus (RISAQUAD) CAPS capsule Take 1 capsule by mouth 2 (two) times daily.    [name] acyclovir (ZOVIRAX) 200 MG capsule Take 200-400 mg by mouth 2 (two) times daily.   0  . anastrozole (ARIMIDEX) 1 MG tablet Take 1 mg by mouth daily.   0  . aspirin 81 MG tablet Take 81 mg by mouth daily.    . Cholecalciferol (VITAMIN D3) 5000 UNITS TABS Take 2 tablets by mouth daily.    [name] DHEA 25 MG CAPS Take 1 capsule by mouth 2 (two) times daily.    [name] ezetimibe (ZETIA) 10 MG tablet Take 10 mg by mouth every evening.    . fluticasone (FLONASE) 50 MCG/ACT nasal spray Place 2 sprays into the nose daily.     [name] letrozole (FEMARA) 2.5 MG tablet Take 2.5 mg by mouth every other day.    . Multiple Vitamin (MULTIVITAMIN WITH MINERALS) TABS Take 1 tablet by mouth daily.    [name] NANDROLONE DECANOATE IM Inject 0.5 mLs into the muscle every 7 (seven) days.    . naproxen sodium (ANAPROX) 220 MG tablet Take 220 mg by mouth 2 (two) times daily with a meal.    . nisoldipine (SULAR) 17 MG 24 hr tablet Take 17 mg by  mouth every morning.    . NON FORMULARY Take 1 tablet by mouth 2 (two) times daily. Ultra natural prostate    . NON FORMULARY Take 2 tablets by mouth 2 (two) times daily. Heart savior    . Omega-3 Fatty Acids (SEA-OMEGA 30) 1200 MG CAPS Take 2 capsules by mouth 2 (two) times daily.    [name] omeprazole (PRILOSEC) 20 MG capsule Take 20 mg by mouth 2 (two) times daily.     [name] Plant Sterol Stanol-Pantethine (CHOLEST OFF COMPLETE PO) Take 2 tablets by mouth 2 (two) times daily.    . potassium gluconate 595 MG TABS Take 595 mg by mouth every evening.    . progesterone (PROMETRIUM) 100 MG capsule Take 100 mg by mouth every evening.    . sildenafil (VIAGRA) 100 MG tablet Take 100 mg by mouth daily as needed for erectile dysfunction.    . Somatropin (OMNITROPE [place]) Inject 0.12 mLs into the skin as directed.    . telmisartan (MICARDIS) 80 MG tablet Take 80 mg by mouth every morning.     . testosterone cypionate (DEPOTESTOTERONE CYPIONATE) 100 MG/ML injection Inject 100 mg into the muscle every 7 (seven) days.  For IM use only    . thyroid (ARMOUR) 60 MG tablet Take 60-120 mg by mouth 2 (two) times daily.     . vitamin C (ASCORBIC ACID) 500 MG tablet Take 500 mg by mouth daily.    . vitamin E 400 UNIT capsule Take 400 Units by mouth daily.    [name] VITAMIN K PO Take 2,700 mcg by mouth 2 (two) times daily.     No current facility-administered medications for this visit.    Allergies  Allergen Reactions  . Morphine And Related     Had "crazy dreams" felt "crazy" per pt.    Past Medical History  Diagnosis Date  . GERD (gastroesophageal reflux disease)   . Coronary artery disease   . Hypothyroidism   . Sleep apnea     hx of, surgery to reverse  . H/O hiatal hernia   . Arthritis   . Hyperlipidemia     statin intolerant, under control  . Hypertension     under control  . H/O bronchitis   . History of kidney stones last in 1976    Height 6\' 1"  (1.854 m), weight 220 lb (99.791 kg).    Tommy Medal PharmD CPP [facility]

## 2014-07-31 NOTE — Patient Instructions (Signed)
Continue with Zetia, Cholestoff and Heart Saviour.  We will start paperwork for [place].    I will call you when I hear more about the approval process

## 2014-07-31 NOTE — Assessment & Plan Note (Signed)
Pt with history of statin intolerance and elevated LDL cholesterol currently managed with zetia, Cholestoff and HeartSavior.  Will request labs from [place] at [facility], as well as statin prescription history from [facility].  Based on the information gathered we can apply for PCSK9 inhibitor.

## 2014-08-02 ENCOUNTER — Ambulatory Visit: Payer: Self-pay | Admitting: Orthopedic Surgery

## 2014-08-02 NOTE — H&P (Signed)
[name] Glass DOB: 03-Oct-1940 Divorced / Language: [name] Glass / Race: White Male Date of Admission:  08/03/2014 CC:  Right Knee Pain History of Present Illness ([name] III PA-C; 07/16/2014 4:54 PM) The patient is a 74 year old male who comes in for a preoperative History and Physical. The patient is scheduled for a right total knee arthroplasty to be performed by Dr. Dione Plover. [name], MD at [facility] on 08-03-2014. The patient is a 74 year old male who presents today for follow up of their knee. The patient is being followed for their right knee pain and osteoarthritis. They are now 1 year(s) (and 9 months) out from the last visit. Symptoms reported today include: pain, aching, giving way and instability, while the patient does not report symptoms of: swelling. The patient feels that they are doing poorly and report their pain level to be moderate. Current treatment includes: activity modification. The following medication has been used for pain control: Aleve in the morning and Tylenol at night (as well as an OTC topical rub). The patient indicates that they have questions or concerns today regarding going ahead with surgery. [name] says his knee is getting progressively worse. It is now limiting what he can and cannot do. He has a lot of pain, but said that the pain itself is tolerable, but the disc function is what is bothering him the most. He would like to remain extremely active, but the knee is preventing him from doing so. He has had cortisone and Visco supplement injections in the past without tremendous benefit. He is ready to proceed with surgery. They have been treated conservatively in the past for the above stated problem and despite conservative measures, they continue to have progressive pain and severe functional limitations and dysfunction. They have failed non-operative management including home exercise, medications, and injections. It is felt that they would  benefit from undergoing total joint replacement. Risks and benefits of the procedure have been discussed with the patient and they elect to proceed with surgery. There are no active contraindications to surgery such as ongoing infection or rapidly progressive neurological disease.  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) Osteoarthritis, knee (M17.9) Pain in joint, lower leg (719.46)02/22/2005 Sprain/strain, shoulder/arm NOS (840.9)06/25/2005 Osteoarthrosis, local NOS, lower leg (715.36)10/10/2005 Disorder, shoulder region NEC (726.2)11/23/2005 Sprain/strain, knee, cruciate ligament (844.2)08/21/2006 Sleep Apnea Prostate Disease Enlarged Prostate Kidney Stone Impaired Vision Shingles High blood pressure Coronary Artery Disease/Heart Disease Hypercholesterolemia Stent 5 Hiatal Hernia Gastroesophageal Reflux Disease Barrett's Esophagus Hemorrhoids Measles Mumps Rubella  Allergies  No Known Drug Allergies10/28/2013 but he does not like to take Morphine- states it makes him paranoid  Family History ([name] Glass, III PA-C; 07/16/2014 4:47 PM) Cerebrovascular Accident grandmother mothers side Hypertension mother, father, grandmother mothers side and grandfather mothers side Heart disease in male family member before age 23 Cancer mother, grandmother mothers side, grandfather mothers side and grandmother fathers side Diabetes Mellitus grandmother fathers side and grandfather fathers side  Social History (Shemar Plemmons Monika Glass, III PA-C; 08/02/2014 5:48 PM) Marital status divorced Number of flights of stairs before winded greater than 5 Tobacco use never smoker Living situation live alone Exercise Exercises weekly; does running / walking, individual sport and gym / weights Alcohol use current drinker; drinks hard liquor; 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 Telmisartan (80MG  Tablet, Oral daily) Active. Zetia (10MG  Tablet, Oral daily) Active. Armour Thyroid (60MG  Tablet, 2 Oral daily) Active. Acyclovir (200MG  Capsule, 3 Oral daily) Active. Femara (2.5MG  Tablet, Oral daily) Active. Viagra (100MG  Tablet, Oral as needed) Active. Testosterone Cypionate (1 injection Intramuscular once a week) Specific dose unknown - Active. Nandrolone Decanoate (1 injection Intramuscular once a week) Specific dose unknown - Active. Omnitrope (1 injection Subcutaneous 5 times per week) Specific dose unknown - Active. Aspirin EC (81MG  Tablet DR, Oral daily) Active. DHEA (25MG  Tablet, 2 Oral daily) Active. Super Omega-3 (1000MG  Capsule, 2000mg  Oral 4 per day) Active. Potassium Gluconate (595MG  Tablet, Oral daily) Active. Vitamin E (400UNIT Tablet, Oral daily) Active. Vitamin C ER (500MG  Tablet ER, Oral daily) Active. Aleve (Oral daily) Specific dose unknown - Active. Tylenol (325MG  Tablet, Oral at bedtime) Active. Omeprazole (20MG  Capsule DR, 2 Oral daily) Active. Multi Vitamin Mens (Oral) Active. Progesterone (100MG  Capsule, Oral) Active.  Past Surgical History ([name] Glass Monika Glass, III PA-C; 07/16/2014 5:05 PM) Prostatectomy; Transurethral Date: 2013. Laser Procedure Inguinal Hernia Repair open: bilateral and multiple times; 1980's Tonsillectomy Sinus Surgery Date: 2006. Sleep Apnea Surgery Heart Stents 5 Stents via Cardiac Cath Procedure Arthroscopy of Shoulder bilateral Arthroscopy of Knee Date: 1995. bilateral Colon Polyp Removal - Colonoscopy Cataract  Surgery bilateral Vasectomy Fundiplication Date: 1610.  Review of Systems (Halie Gass L. Naphtali Zywicki III PA-C; 07/16/2014 4:46 PM) General Not Present- Chills, Fatigue, Fever, Memory Loss, Night Sweats, Weight Gain and Weight Loss. Skin Not Present- Eczema, Hives, Itching, Lesions and Rash. HEENT Not Present- Dentures, Double Vision, Headache, Hearing Loss, Tinnitus and Visual Loss. Respiratory Not Present- Allergies, Chronic Cough, Coughing up blood, Shortness of breath at rest and Shortness of breath with exertion. Cardiovascular Not Present- Chest Pain, Difficulty Breathing Lying Down, Murmur, Palpitations, Racing/skipping heartbeats and Swelling. Gastrointestinal Not Present- Abdominal Pain, Bloody Stool, Constipation, Diarrhea, Difficulty Swallowing, Heartburn, Jaundice, Loss of appetitie, Nausea and Vomiting. Male Genitourinary Present- Urinating at Night. Not Present- Blood in Urine, Discharge, Flank Pain, Incontinence, Painful Urination, Urgency, Urinary frequency, Urinary Retention and Weak urinary stream. Musculoskeletal Present- Joint Pain. Not Present- Back Pain, Joint Swelling, Morning Stiffness, Muscle Pain, Muscle Weakness and Spasms. Neurological Not Present- Blackout spells, Difficulty with balance, Dizziness, Paralysis, Tremor and Weakness. Psychiatric Not Present- Insomnia.  Vitals (Caedon Bond L. Scottie Metayer III PA-C; 07/16/2014 5:07 PM) 07/16/2014 5:06 PM Weight: 221 lb Height: 72.25in Weight was reported by patient. Height was reported by patient. Body Surface Area: 2.23 m Body Mass Index: 29.77 kg/m  BP: 140/70 (Sitting, Left Arm, Standard)  Physical Exam (Aasiya Creasey L. Shadrach Bartunek III PA-C; 07/16/2014 5:09 PM) General Mental Status -Alert, cooperative and good historian. General Appearance-pleasant, Not in acute distress. Orientation-Oriented X3. Build & Nutrition-Well nourished and Well developed.  Head and Neck Head-normocephalic, atraumatic  . Neck Global Assessment - supple, no bruit auscultated on the right, no bruit auscultated on the left.  Eye Pupil - Bilateral-Regular and Round. Motion - Bilateral-EOMI.  Chest and Lung Exam Auscultation Breath sounds - clear at anterior chest wall and clear at posterior chest wall. Adventitious sounds - No Adventitious sounds.  Cardiovascular Auscultation Rhythm - Regular rate and rhythm. Heart Sounds - S1 WNL and S2 WNL. Murmurs & Other Heart Sounds: Murmur 1 - Location - Aortic Area. Timing - Mid-systolic. Grade - II/VI.  Character - Low pitched.  Abdomen Palpation/Percussion Tenderness - Abdomen is non-tender to palpation. Rigidity (guarding) - Abdomen is soft. Auscultation Auscultation of the abdomen reveals - Bowel sounds normal.  Male Genitourinary Note: Not done, not pertinent to present illness   Musculoskeletal Note: Well developed male, in no distress. His left hip shows normal range of motion and no discomfort. His left knee shows no effusion. Range of motion is 0 to 125 degrees. There is marked crepitus on range of motion of the left knee. There is tenderness, medial greater than lateral with no instability noted. Pulses, sensation, and motor are intact distally. Right knee exam is unremarkable.  RADIOGRAPHS: AP and lateral of both knees show that he has bone on bone arthritis in the medial and patellofemoral compartments of the left knee. The right knee has minimal change.   Assessment & Plan (Harolyn Cocker L. 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 Pt Education - [name] - Postoperative Constipation Education: discussed with patient and provided information. Note:Surgical Plans: Right Total Knee Repalcement  Disposition: Home  PCP: Dr. Briscoe Deutscher Cards: Dr. [name]  Topical TXA -  CAD and Heart Stents  Anesthesia Issues: None  Signed electronically by [place] Sylvan Monika Glass, III PA-C

## 2014-08-02 NOTE — Anesthesia Preprocedure Evaluation (Addendum)
Anesthesia Evaluation  Patient identified by MRN, date of birth, ID band Patient awake    Reviewed: Allergy & Precautions, NPO status , Patient's Chart, lab work & pertinent test results  Airway Mallampati: II  TM Distance: >3 FB Neck ROM: Full    Dental no notable dental hx. (+) Dental Advisory Given   Pulmonary sleep apnea ,  breath sounds clear to auscultation  Pulmonary exam normal       Cardiovascular hypertension, Pt. on medications + CAD and + Cardiac Stents (2006, circumflex and RCA) Rhythm:Regular Rate:Normal  Cleared for his upcoming procedure low risk, negative Myoview stress test   Neuro/Psych negative neurological ROS  negative psych ROS   GI/Hepatic Neg liver ROS, hiatal hernia, GERD-  Medicated and Controlled,  Endo/Other  Hypothyroidism   Renal/GU negative Renal ROS  negative genitourinary   Musculoskeletal  (+) Arthritis -,   Abdominal   Peds negative pediatric ROS (+)  Hematology negative hematology ROS (+)   Anesthesia Other Findings   Reproductive/Obstetrics negative OB ROS                            Anesthesia Physical Anesthesia Plan  ASA: II  Anesthesia Plan: General   Post-op Pain Management:    Induction:   Airway Management Planned: Oral ETT  Additional Equipment:   Intra-op Plan:   Post-operative Plan:   Informed Consent: I have reviewed the patients History and Physical, chart, labs and discussed the procedure including the risks, benefits and alternatives for the proposed anesthesia with the patient or authorized representative who has indicated his/her understanding and acceptance.   Dental advisory given  Plan Discussed with: CRNA  Anesthesia Plan Comments:        Anesthesia Quick Evaluation

## 2014-08-03 ENCOUNTER — Inpatient Hospital Stay (HOSPITAL_COMMUNITY)
Admission: RE | Admit: 2014-08-03 | Discharge: 2014-08-05 | DRG: 470 | Disposition: A | Discharge status: Home / Self Care | Payer: Medicare Other | Source: Ambulatory Visit | Attending: Orthopedic Surgery | Admitting: Orthopedic Surgery

## 2014-08-03 ENCOUNTER — Encounter (HOSPITAL_COMMUNITY): Payer: Self-pay | Admitting: *Deleted

## 2014-08-03 ENCOUNTER — Inpatient Hospital Stay (HOSPITAL_COMMUNITY): Payer: Medicare Other | Admitting: Anesthesiology

## 2014-08-03 ENCOUNTER — Encounter (HOSPITAL_COMMUNITY)
Admission: RE | Disposition: A | Discharge status: Home / Self Care | Payer: Self-pay | Source: Ambulatory Visit | Attending: Orthopedic Surgery

## 2014-08-03 DIAGNOSIS — Z87442 Personal history of urinary calculi: Secondary | ICD-10-CM | POA: Diagnosis not present

## 2014-08-03 DIAGNOSIS — N400 Enlarged prostate without lower urinary tract symptoms: Secondary | ICD-10-CM | POA: Diagnosis present

## 2014-08-03 DIAGNOSIS — I2510 Atherosclerotic heart disease of native coronary artery without angina pectoris: Secondary | ICD-10-CM | POA: Diagnosis present

## 2014-08-03 DIAGNOSIS — M1711 Unilateral primary osteoarthritis, right knee: Secondary | ICD-10-CM | POA: Diagnosis not present

## 2014-08-03 DIAGNOSIS — M179 Osteoarthritis of knee, unspecified: Secondary | ICD-10-CM | POA: Diagnosis present

## 2014-08-03 DIAGNOSIS — M25561 Pain in right knee: Secondary | ICD-10-CM | POA: Diagnosis not present

## 2014-08-03 DIAGNOSIS — G4730 Sleep apnea, unspecified: Secondary | ICD-10-CM | POA: Diagnosis present

## 2014-08-03 DIAGNOSIS — E039 Hypothyroidism, unspecified: Secondary | ICD-10-CM | POA: Diagnosis present

## 2014-08-03 DIAGNOSIS — K219 Gastro-esophageal reflux disease without esophagitis: Secondary | ICD-10-CM | POA: Diagnosis present

## 2014-08-03 DIAGNOSIS — E785 Hyperlipidemia, unspecified: Secondary | ICD-10-CM | POA: Diagnosis present

## 2014-08-03 DIAGNOSIS — H547 Unspecified visual loss: Secondary | ICD-10-CM | POA: Diagnosis present

## 2014-08-03 DIAGNOSIS — I10 Essential (primary) hypertension: Secondary | ICD-10-CM | POA: Diagnosis not present

## 2014-08-03 DIAGNOSIS — M1710 Unilateral primary osteoarthritis, unspecified knee: Secondary | ICD-10-CM | POA: Diagnosis present

## 2014-08-03 HISTORY — PX: TOTAL KNEE ARTHROPLASTY: SHX125

## 2014-08-03 LAB — TYPE AND SCREEN
ABO/RH(D): O POS
Antibody Screen: NEGATIVE

## 2014-08-03 LAB — ABO/RH: ABO/RH(D): O POS

## 2014-08-03 SURGERY — ARTHROPLASTY, KNEE, TOTAL
Anesthesia: General | Site: Knee | Laterality: Right

## 2014-08-03 MED ORDER — HYDROMORPHONE HCL 2 MG PO TABS
2.0000 mg | ORAL_TABLET | ORAL | Status: DC | PRN
Start: 2014-08-03 — End: 2014-08-05
  Administered 2014-08-03: 4 mg via ORAL
  Administered 2014-08-03: 2 mg via ORAL
  Administered 2014-08-04 (×2): 4 mg via ORAL
  Administered 2014-08-04: 2 mg via ORAL
  Administered 2014-08-04 – 2014-08-05 (×3): 4 mg via ORAL
  Administered 2014-08-05: 2 mg via ORAL
  Filled 2014-08-03 (×6): qty 2
  Filled 2014-08-03 (×2): qty 1
  Filled 2014-08-03: qty 2

## 2014-08-03 MED ORDER — LIDOCAINE HCL (CARDIAC) 20 MG/ML IV SOLN
INTRAVENOUS | Status: DC | PRN
  Administered 2014-08-03: 100 mg via INTRAVENOUS

## 2014-08-03 MED ORDER — HYDROMORPHONE HCL 1 MG/ML IJ SOLN
0.5000 mg | INTRAMUSCULAR | Status: DC | PRN
  Administered 2014-08-03 – 2014-08-04 (×7): 1 mg via INTRAVENOUS
  Filled 2014-08-03 (×7): qty 1

## 2014-08-03 MED ORDER — PROPOFOL 10 MG/ML IV BOLUS
INTRAVENOUS | Status: DC | PRN
  Administered 2014-08-03: 200 mg via INTRAVENOUS

## 2014-08-03 MED ORDER — MIDAZOLAM HCL 2 MG/2ML IJ SOLN
INTRAMUSCULAR | Status: AC
  Filled 2014-08-03: qty 2

## 2014-08-03 MED ORDER — FLEET ENEMA 7-19 GM/118ML RE ENEM: 1.0000 | ENEMA | Freq: Once | RECTAL | Status: AC | PRN

## 2014-08-03 MED ORDER — ONDANSETRON HCL 4 MG/2ML IJ SOLN
INTRAMUSCULAR | Status: DC | PRN
  Administered 2014-08-03: 4 mg via INTRAVENOUS

## 2014-08-03 MED ORDER — ANASTROZOLE 1 MG PO TABS
1.0000 mg | ORAL_TABLET | Freq: Every day | ORAL | Status: DC
  Filled 2014-08-03 (×2): qty 1

## 2014-08-03 MED ORDER — IRBESARTAN 300 MG PO TABS
300.0000 mg | ORAL_TABLET | Freq: Every day | ORAL | Status: DC
  Administered 2014-08-04: 300 mg via ORAL
  Filled 2014-08-03: qty 1

## 2014-08-03 MED ORDER — RIVAROXABAN 10 MG PO TABS
10.0000 mg | ORAL_TABLET | Freq: Every day | ORAL | Status: DC
  Administered 2014-08-04 – 2014-08-05 (×2): 10 mg via ORAL
  Filled 2014-08-03 (×3): qty 1

## 2014-08-03 MED ORDER — THYROID 120 MG PO TABS
120.0000 mg | ORAL_TABLET | Freq: Every day | ORAL | Status: DC
  Administered 2014-08-04 – 2014-08-05 (×2): 120 mg via ORAL
  Filled 2014-08-03 (×3): qty 1

## 2014-08-03 MED ORDER — SODIUM CHLORIDE 0.9 % IJ SOLN
INTRAMUSCULAR | Status: AC
  Filled 2014-08-03: qty 50

## 2014-08-03 MED ORDER — THYROID 60 MG PO TABS
60.0000 mg | ORAL_TABLET | ORAL | Status: DC
Start: 2014-08-04 — End: 2014-08-05
  Administered 2014-08-04 – 2014-08-05 (×2): 60 mg via ORAL
  Filled 2014-08-03 (×2): qty 1

## 2014-08-03 MED ORDER — EPHEDRINE SULFATE 50 MG/ML IJ SOLN
INTRAMUSCULAR | Status: AC
  Filled 2014-08-03: qty 1

## 2014-08-03 MED ORDER — BUPIVACAINE HCL 0.25 % IJ SOLN
INTRAMUSCULAR | Status: DC | PRN
  Administered 2014-08-03: 20 mL

## 2014-08-03 MED ORDER — ONDANSETRON HCL 4 MG PO TABS: 4.0000 mg | ORAL_TABLET | Freq: Four times a day (QID) | ORAL | Status: DC | PRN

## 2014-08-03 MED ORDER — MENTHOL 3 MG MT LOZG
1.0000 | LOZENGE | OROMUCOSAL | Status: DC | PRN
  Filled 2014-08-03: qty 9

## 2014-08-03 MED ORDER — PROPOFOL 10 MG/ML IV BOLUS
INTRAVENOUS | Status: AC
  Filled 2014-08-03: qty 20

## 2014-08-03 MED ORDER — FENTANYL CITRATE 0.05 MG/ML IJ SOLN
INTRAMUSCULAR | Status: DC | PRN
  Administered 2014-08-03 (×5): 50 ug via INTRAVENOUS

## 2014-08-03 MED ORDER — TRAMADOL HCL 50 MG PO TABS
50.0000 mg | ORAL_TABLET | Freq: Four times a day (QID) | ORAL | Status: DC | PRN
  Administered 2014-08-04: 100 mg via ORAL
  Filled 2014-08-03: qty 2

## 2014-08-03 MED ORDER — FENTANYL CITRATE 0.05 MG/ML IJ SOLN
INTRAMUSCULAR | Status: AC
  Filled 2014-08-03: qty 5

## 2014-08-03 MED ORDER — ROCURONIUM BROMIDE 100 MG/10ML IV SOLN
INTRAVENOUS | Status: AC
  Filled 2014-08-03: qty 1

## 2014-08-03 MED ORDER — POLYETHYLENE GLYCOL 3350 17 G PO PACK
17.0000 g | PACK | Freq: Every day | ORAL | Status: DC | PRN
Start: 2014-08-03 — End: 2014-08-05

## 2014-08-03 MED ORDER — FENTANYL CITRATE 0.05 MG/ML IJ SOLN
INTRAMUSCULAR | Status: AC
  Filled 2014-08-03: qty 2

## 2014-08-03 MED ORDER — GLYCOPYRROLATE 0.2 MG/ML IJ SOLN
INTRAMUSCULAR | Status: DC | PRN
  Administered 2014-08-03: 0.4 mg via INTRAVENOUS
  Administered 2014-08-03: 0.2 mg via INTRAVENOUS

## 2014-08-03 MED ORDER — BUPIVACAINE LIPOSOME 1.3 % IJ SUSP
20.0000 mL | Freq: Once | INTRAMUSCULAR | Status: DC
  Filled 2014-08-03: qty 20

## 2014-08-03 MED ORDER — ACETAMINOPHEN 650 MG RE SUPP: 650.0000 mg | Freq: Four times a day (QID) | RECTAL | Status: DC | PRN

## 2014-08-03 MED ORDER — ONDANSETRON HCL 4 MG/2ML IJ SOLN
4.0000 mg | Freq: Four times a day (QID) | INTRAMUSCULAR | Status: DC | PRN
Start: 2014-08-03 — End: 2014-08-05

## 2014-08-03 MED ORDER — TRANEXAMIC ACID 100 MG/ML IV SOLN
2000.0000 mg | Freq: Once | INTRAVENOUS | Status: DC
  Filled 2014-08-03: qty 20

## 2014-08-03 MED ORDER — NEOSTIGMINE METHYLSULFATE 10 MG/10ML IV SOLN
INTRAVENOUS | Status: DC | PRN
  Administered 2014-08-03: 3 mg via INTRAVENOUS

## 2014-08-03 MED ORDER — ACETAMINOPHEN 500 MG PO TABS
1000.0000 mg | ORAL_TABLET | Freq: Four times a day (QID) | ORAL | Status: AC
  Administered 2014-08-03 – 2014-08-04 (×4): 1000 mg via ORAL
  Filled 2014-08-03 (×5): qty 2

## 2014-08-03 MED ORDER — CEFAZOLIN SODIUM-DEXTROSE 2-3 GM-% IV SOLR
2.0000 g | INTRAVENOUS | Status: AC
  Administered 2014-08-03: 2 g via INTRAVENOUS

## 2014-08-03 MED ORDER — BUPIVACAINE HCL (PF) 0.25 % IJ SOLN
INTRAMUSCULAR | Status: AC
  Filled 2014-08-03: qty 30

## 2014-08-03 MED ORDER — PHENOL 1.4 % MT LIQD
1.0000 | OROMUCOSAL | Status: DC | PRN
  Filled 2014-08-03: qty 177

## 2014-08-03 MED ORDER — BISACODYL 10 MG RE SUPP: 10.0000 mg | Freq: Every day | RECTAL | Status: DC | PRN

## 2014-08-03 MED ORDER — HYDROMORPHONE HCL 1 MG/ML IJ SOLN
INTRAMUSCULAR | Status: DC | PRN
  Administered 2014-08-03 (×2): 1 mg via INTRAVENOUS

## 2014-08-03 MED ORDER — DEXAMETHASONE SODIUM PHOSPHATE 10 MG/ML IJ SOLN
10.0000 mg | Freq: Once | INTRAMUSCULAR | Status: AC
  Administered 2014-08-03: 10 mg via INTRAVENOUS

## 2014-08-03 MED ORDER — ACETAMINOPHEN 10 MG/ML IV SOLN
1000.0000 mg | Freq: Once | INTRAVENOUS | Status: AC
  Administered 2014-08-03: 1000 mg via INTRAVENOUS
  Filled 2014-08-03 (×2): qty 100

## 2014-08-03 MED ORDER — DOCUSATE SODIUM 100 MG PO CAPS
100.0000 mg | ORAL_CAPSULE | Freq: Two times a day (BID) | ORAL | Status: DC
  Administered 2014-08-03 – 2014-08-05 (×5): 100 mg via ORAL

## 2014-08-03 MED ORDER — METOCLOPRAMIDE HCL 10 MG PO TABS: 5.0000 mg | ORAL_TABLET | Freq: Three times a day (TID) | ORAL | Status: DC | PRN

## 2014-08-03 MED ORDER — LACTATED RINGERS IV SOLN
INTRAVENOUS | Status: DC | PRN
  Administered 2014-08-03 (×2): via INTRAVENOUS

## 2014-08-03 MED ORDER — SUCCINYLCHOLINE CHLORIDE 20 MG/ML IJ SOLN
INTRAMUSCULAR | Status: DC | PRN
  Administered 2014-08-03: 100 mg via INTRAVENOUS

## 2014-08-03 MED ORDER — FLUTICASONE PROPIONATE 50 MCG/ACT NA SUSP
2.0000 | Freq: Every day | NASAL | Status: DC
  Administered 2014-08-04 – 2014-08-05 (×2): 2 via NASAL
  Filled 2014-08-03: qty 16

## 2014-08-03 MED ORDER — ONDANSETRON HCL 4 MG/2ML IJ SOLN: 4.0000 mg | Freq: Once | INTRAMUSCULAR | Status: DC | PRN

## 2014-08-03 MED ORDER — SILDENAFIL CITRATE 100 MG PO TABS: 100.0000 mg | ORAL_TABLET | Freq: Every day | ORAL | Status: DC | PRN

## 2014-08-03 MED ORDER — TRANEXAMIC ACID 100 MG/ML IV SOLN
2000.0000 mg | INTRAVENOUS | Status: DC | PRN
  Administered 2014-08-03: 2000 mg via TOPICAL

## 2014-08-03 MED ORDER — DEXAMETHASONE SODIUM PHOSPHATE 10 MG/ML IJ SOLN
10.0000 mg | Freq: Once | INTRAMUSCULAR | Status: AC
  Administered 2014-08-04: 10 mg via INTRAVENOUS
  Filled 2014-08-03: qty 1

## 2014-08-03 MED ORDER — PHENYLEPHRINE 40 MCG/ML (10ML) SYRINGE FOR IV PUSH (FOR BLOOD PRESSURE SUPPORT)
PREFILLED_SYRINGE | INTRAVENOUS | Status: AC
  Filled 2014-08-03: qty 10

## 2014-08-03 MED ORDER — SODIUM CHLORIDE 0.9 % IJ SOLN
INTRAMUSCULAR | Status: DC | PRN
  Administered 2014-08-03: 30 mL via INTRAVENOUS

## 2014-08-03 MED ORDER — BUPIVACAINE LIPOSOME 1.3 % IJ SUSP
INTRAMUSCULAR | Status: DC | PRN
Start: 2014-08-03 — End: 2014-08-03
  Administered 2014-08-03: 20 mL

## 2014-08-03 MED ORDER — CEFAZOLIN SODIUM-DEXTROSE 2-3 GM-% IV SOLR
2.0000 g | Freq: Four times a day (QID) | INTRAVENOUS | Status: AC
  Administered 2014-08-03 – 2014-08-04 (×2): 2 g via INTRAVENOUS
  Filled 2014-08-03 (×2): qty 50

## 2014-08-03 MED ORDER — METHOCARBAMOL 500 MG PO TABS
500.0000 mg | ORAL_TABLET | Freq: Four times a day (QID) | ORAL | Status: DC | PRN
  Administered 2014-08-04 – 2014-08-05 (×4): 500 mg via ORAL
  Filled 2014-08-03 (×4): qty 1

## 2014-08-03 MED ORDER — ACETAMINOPHEN 325 MG PO TABS: 650.0000 mg | ORAL_TABLET | Freq: Four times a day (QID) | ORAL | Status: DC | PRN

## 2014-08-03 MED ORDER — SODIUM CHLORIDE 0.9 % IJ SOLN
INTRAMUSCULAR | Status: AC
  Filled 2014-08-03: qty 10

## 2014-08-03 MED ORDER — PANTOPRAZOLE SODIUM 40 MG PO TBEC
40.0000 mg | DELAYED_RELEASE_TABLET | Freq: Two times a day (BID) | ORAL | Status: DC
  Administered 2014-08-03 – 2014-08-04 (×2): 40 mg via ORAL
  Filled 2014-08-03 (×4): qty 1

## 2014-08-03 MED ORDER — ACYCLOVIR 200 MG PO CAPS
400.0000 mg | ORAL_CAPSULE | Freq: Every day | ORAL | Status: DC
  Administered 2014-08-04 – 2014-08-05 (×2): 400 mg via ORAL
  Filled 2014-08-03 (×2): qty 2

## 2014-08-03 MED ORDER — LIDOCAINE HCL (CARDIAC) 20 MG/ML IV SOLN
INTRAVENOUS | Status: AC
  Filled 2014-08-03: qty 5

## 2014-08-03 MED ORDER — DIPHENHYDRAMINE HCL 12.5 MG/5ML PO ELIX: 12.5000 mg | ORAL_SOLUTION | ORAL | Status: DC | PRN

## 2014-08-03 MED ORDER — ONDANSETRON HCL 4 MG/2ML IJ SOLN
INTRAMUSCULAR | Status: AC
  Filled 2014-08-03: qty 2

## 2014-08-03 MED ORDER — PHENYLEPHRINE HCL 10 MG/ML IJ SOLN
INTRAMUSCULAR | Status: DC | PRN
  Administered 2014-08-03 (×2): 80 ug via INTRAVENOUS

## 2014-08-03 MED ORDER — ROCURONIUM BROMIDE 100 MG/10ML IV SOLN
INTRAVENOUS | Status: DC | PRN
  Administered 2014-08-03: 30 mg via INTRAVENOUS

## 2014-08-03 MED ORDER — METOCLOPRAMIDE HCL 5 MG/ML IJ SOLN: 5.0000 mg | Freq: Three times a day (TID) | INTRAMUSCULAR | Status: DC | PRN

## 2014-08-03 MED ORDER — SODIUM CHLORIDE 0.9 % IV SOLN: INTRAVENOUS | Status: DC

## 2014-08-03 MED ORDER — DEXAMETHASONE SODIUM PHOSPHATE 10 MG/ML IJ SOLN
INTRAMUSCULAR | Status: AC
  Filled 2014-08-03: qty 1

## 2014-08-03 MED ORDER — EZETIMIBE 10 MG PO TABS
10.0000 mg | ORAL_TABLET | Freq: Every evening | ORAL | Status: DC
Start: 2014-08-03 — End: 2014-08-05
  Administered 2014-08-03 – 2014-08-04 (×2): 10 mg via ORAL
  Filled 2014-08-03 (×3): qty 1

## 2014-08-03 MED ORDER — CEFAZOLIN SODIUM-DEXTROSE 2-3 GM-% IV SOLR
INTRAVENOUS | Status: AC
  Filled 2014-08-03: qty 50

## 2014-08-03 MED ORDER — SODIUM CHLORIDE 0.9 % IR SOLN
Status: DC | PRN
  Administered 2014-08-03: 1000 mL

## 2014-08-03 MED ORDER — METHOCARBAMOL 1000 MG/10ML IJ SOLN
500.0000 mg | Freq: Four times a day (QID) | INTRAVENOUS | Status: DC | PRN
  Administered 2014-08-03: 500 mg via INTRAVENOUS
  Filled 2014-08-03 (×2): qty 5

## 2014-08-03 MED ORDER — ACYCLOVIR 200 MG PO CAPS
200.0000 mg | ORAL_CAPSULE | Freq: Every day | ORAL | Status: DC
  Administered 2014-08-03 – 2014-08-04 (×2): 200 mg via ORAL
  Filled 2014-08-03 (×3): qty 1

## 2014-08-03 MED ORDER — GLYCOPYRROLATE 0.2 MG/ML IJ SOLN
INTRAMUSCULAR | Status: AC
  Filled 2014-08-03: qty 2

## 2014-08-03 MED ORDER — HYDROMORPHONE HCL 2 MG/ML IJ SOLN
INTRAMUSCULAR | Status: AC
  Filled 2014-08-03: qty 1

## 2014-08-03 MED ORDER — 0.9 % SODIUM CHLORIDE (POUR BTL) OPTIME
TOPICAL | Status: DC | PRN
  Administered 2014-08-03: 1000 mL

## 2014-08-03 MED ORDER — DEXTROSE-NACL 5-0.9 % IV SOLN
INTRAVENOUS | Status: DC
  Administered 2014-08-03 – 2014-08-04 (×2): via INTRAVENOUS

## 2014-08-03 MED ORDER — FENTANYL CITRATE 0.05 MG/ML IJ SOLN
25.0000 ug | INTRAMUSCULAR | Status: DC | PRN
  Administered 2014-08-03: 25 ug via INTRAVENOUS
  Administered 2014-08-03: 50 ug via INTRAVENOUS
  Administered 2014-08-03: 25 ug via INTRAVENOUS

## 2014-08-03 SURGICAL SUPPLY — 64 items
BAG DECANTER FOR FLEXI CONT (MISCELLANEOUS) ×3 IMPLANT
BAG SPEC THK2 15X12 ZIP CLS (MISCELLANEOUS) ×1
BAG ZIPLOCK 12X15 (MISCELLANEOUS) ×3 IMPLANT
BANDAGE ELASTIC 6 VELCRO ST LF (GAUZE/BANDAGES/DRESSINGS) ×3 IMPLANT
BANDAGE ESMARK 6X9 LF (GAUZE/BANDAGES/DRESSINGS) ×1 IMPLANT
BLADE SAG 18X100X1.27 (BLADE) ×3 IMPLANT
BLADE SAW SGTL 11.0X1.19X90.0M (BLADE) ×3 IMPLANT
BNDG CMPR 9X6 STRL LF SNTH (GAUZE/BANDAGES/DRESSINGS) ×1
BNDG ESMARK 6X9 LF (GAUZE/BANDAGES/DRESSINGS) ×3
BOWL SMART MIX CTS (DISPOSABLE) ×3 IMPLANT
CAP KNEE TOTAL 3 SIGMA ×2 IMPLANT
CEMENT HV SMART SET (Cement) ×6 IMPLANT
CLOSURE WOUND 1/2 X4 (GAUZE/BANDAGES/DRESSINGS) ×1
CUFF TOURN SGL QUICK 34 (TOURNIQUET CUFF) ×3
CUFF TRNQT CYL 34X4X40X1 (TOURNIQUET CUFF) ×1 IMPLANT
DECANTER SPIKE VIAL GLASS SM (MISCELLANEOUS) ×3 IMPLANT
DRAPE EXTREMITY T 121X128X90 (DRAPE) ×3 IMPLANT
DRAPE POUCH INSTRU U-SHP 10X18 (DRAPES) ×3 IMPLANT
DRAPE U-SHAPE 47X51 STRL (DRAPES) ×3 IMPLANT
DRSG ADAPTIC 3X8 NADH LF (GAUZE/BANDAGES/DRESSINGS) ×3 IMPLANT
DRSG PAD ABDOMINAL 8X10 ST (GAUZE/BANDAGES/DRESSINGS) ×3 IMPLANT
DURAPREP 26ML APPLICATOR (WOUND CARE) ×3 IMPLANT
ELECT REM PT RETURN 9FT ADLT (ELECTROSURGICAL) ×3
ELECTRODE REM PT RTRN 9FT ADLT (ELECTROSURGICAL) ×1 IMPLANT
EVACUATOR 1/8 PVC DRAIN (DRAIN) ×3 IMPLANT
FACESHIELD WRAPAROUND (MASK) ×15 IMPLANT
FACESHIELD WRAPAROUND OR TEAM (MASK) ×5 IMPLANT
GAUZE SPONGE 4X4 12PLY STRL (GAUZE/BANDAGES/DRESSINGS) ×3 IMPLANT
GLOVE BIO SURGEON STRL SZ7.5 (GLOVE) IMPLANT
GLOVE BIO SURGEON STRL SZ8 (GLOVE) ×3 IMPLANT
GLOVE BIOGEL PI IND STRL 6.5 (GLOVE) IMPLANT
GLOVE BIOGEL PI IND STRL 8 (GLOVE) ×1 IMPLANT
GLOVE BIOGEL PI INDICATOR 6.5 (GLOVE)
GLOVE BIOGEL PI INDICATOR 8 (GLOVE) ×2
GLOVE SURG SS PI 6.5 STRL IVOR (GLOVE) IMPLANT
GOWN STRL REUS W/TWL LRG LVL3 (GOWN DISPOSABLE) ×3 IMPLANT
GOWN STRL REUS W/TWL XL LVL3 (GOWN DISPOSABLE) IMPLANT
HANDPIECE INTERPULSE COAX TIP (DISPOSABLE) ×3
IMMOBILIZER KNEE 20 (SOFTGOODS) ×2 IMPLANT
KIT BASIN OR (CUSTOM PROCEDURE TRAY) ×3 IMPLANT
MANIFOLD NEPTUNE II (INSTRUMENTS) ×3 IMPLANT
NDL SAFETY ECLIPSE 18X1.5 (NEEDLE) ×2 IMPLANT
NEEDLE HYPO 18GX1.5 SHARP (NEEDLE) ×6
NS IRRIG 1000ML POUR BTL (IV SOLUTION) ×3 IMPLANT
PACK TOTAL JOINT (CUSTOM PROCEDURE TRAY) ×3 IMPLANT
PAD ABD 8X10 STRL (GAUZE/BANDAGES/DRESSINGS) ×2 IMPLANT
PADDING CAST COTTON 6X4 STRL (CAST SUPPLIES) ×5 IMPLANT
PEN SKIN MARKING BROAD (MISCELLANEOUS) ×3 IMPLANT
POSITIONER SURGICAL ARM (MISCELLANEOUS) ×3 IMPLANT
SET HNDPC FAN SPRY TIP SCT (DISPOSABLE) ×1 IMPLANT
STRIP CLOSURE SKIN 1/2X4 (GAUZE/BANDAGES/DRESSINGS) ×3 IMPLANT
SUCTION FRAZIER 12FR DISP (SUCTIONS) ×3 IMPLANT
SUT MNCRL AB 4-0 PS2 18 (SUTURE) ×3 IMPLANT
SUT VIC AB 2-0 CT1 27 (SUTURE) ×9
SUT VIC AB 2-0 CT1 TAPERPNT 27 (SUTURE) ×3 IMPLANT
SUT VLOC 180 0 24IN GS25 (SUTURE) ×3 IMPLANT
SYR 20CC LL (SYRINGE) ×3 IMPLANT
SYR 50ML LL SCALE MARK (SYRINGE) ×5 IMPLANT
TOWEL OR 17X26 10 PK STRL BLUE (TOWEL DISPOSABLE) ×3 IMPLANT
TOWEL OR NON WOVEN STRL DISP B (DISPOSABLE) IMPLANT
TRAY FOLEY CATH 16FRSI W/METER (SET/KITS/TRAYS/PACK) ×2 IMPLANT
WATER STERILE IRR 1500ML POUR (IV SOLUTION) ×3 IMPLANT
WRAP KNEE MAXI GEL POST OP (GAUZE/BANDAGES/DRESSINGS) ×3 IMPLANT
YANKAUER SUCT BULB TIP 10FT TU (MISCELLANEOUS) ×3 IMPLANT

## 2014-08-03 NOTE — Progress Notes (Signed)
Utilization review completed.  

## 2014-08-03 NOTE — Op Note (Signed)
Pre-operative diagnosis- Osteoarthritis  Right knee(s)  Post-operative diagnosis- Osteoarthritis Right knee(s)  Procedure-  Right  Total Knee Arthroplasty  Surgeon- Dione Plover. Sanjeev Main, MD  Assistant- Arlee Muslim, PA-C   Anesthesia-  Spinal  EBL-* No blood loss amount entered *   Drains Hemovac  Tourniquet time-  Total Tourniquet Time Documented: Thigh (Right) - 32 minutes Total: Thigh (Right) - 32 minutes     Complications- None  Condition-PACU - hemodynamically stable.   Brief Clinical Note  [name] Glass is a 74 y.o. year old male with end stage OA of his right knee with progressively worsening pain and dysfunction. He has constant pain, with activity and at rest and significant functional deficits with difficulties even with ADLs. He has had extensive non-op management including analgesics, injections of cortisone and viscosupplements, and home exercise program, but remains in significant pain with significant dysfunction. Radiographs show bone on bone arthritis medial and patellofemoral. He presents now for right Total Knee Arthroplasty.    Procedure in detail---   The patient is brought into the operating room and positioned supine on the operating table. After successful administration of  Spinal,   a tourniquet is placed high on the  Right thigh(s) and the lower extremity is prepped and draped in the usual sterile fashion. Time out is performed by the operating team and then the  Right lower extremity is wrapped in Esmarch, knee flexed and the tourniquet inflated to 300 mmHg.       A midline incision is made with a ten blade through the subcutaneous tissue to the level of the extensor mechanism. A fresh blade is used to make a medial parapatellar arthrotomy. Soft tissue over the proximal medial tibia is subperiosteally elevated to the joint line with a knife and into the semimembranosus bursa with a Cobb elevator. Soft tissue over the proximal lateral tibia is elevated with  attention being paid to avoiding the patellar tendon on the tibial tubercle. The patella is everted, knee flexed 90 degrees and the ACL and PCL are removed. Findings are bone on bone medial and patellofemoral with large emedial ostoephytes.        The drill is used to create a starting hole in the distal femur and the canal is thoroughly irrigated with sterile saline to remove the fatty contents. The 5 degree Right  valgus alignment guide is placed into the femoral canal and the distal femoral cutting block is pinned to remove 10 mm off the distal femur. Resection is made with an oscillating saw.      The tibia is subluxed forward and the menisci are removed. The extramedullary alignment guide is placed referencing proximally at the medial aspect of the tibial tubercle and distally along the second metatarsal axis and tibial crest. The block is pinned to remove 48mm off the more deficient medial  side. Resection is made with an oscillating saw. Size 5is the most appropriate size for the tibia and the proximal tibia is prepared with the modular drill and keel punch for that size.      The femoral sizing guide is placed and size 4 is most appropriate. Rotation is marked off the epicondylar axis and confirmed by creating a rectangular flexion gap at 90 degrees. The size 4 cutting block is pinned in this rotation and the anterior, posterior and chamfer cuts are made with the oscillating saw. The intercondylar block is then placed and that cut is made.      Trial size 5 tibial component,  trial size 4 posterior stabilized femur and a 12.5  mm posterior stabilized rotating platform insert trial is placed. Full extension is achieved with excellent varus/valgus and anterior/posterior balance throughout full range of motion. The patella is everted and thickness measured to be 27  mm. Free hand resection is taken to 15 mm, a 41 template is placed, lug holes are drilled, trial patella is placed, and it tracks normally.  Osteophytes are removed off the posterior femur with the trial in place. All trials are removed and the cut bone surfaces prepared with pulsatile lavage. Cement is mixed and once ready for implantation, the size 5 tibial implant, size  4 posterior stabilized femoral component, and the size 41 patella are cemented in place and the patella is held with the clamp. The trial insert is placed and the knee held in full extension. The Exparel (20 ml mixed with 30 ml saline) and .25% Bupivicaine, are injected into the extensor mechanism, posterior capsule, medial and lateral gutters and subcutaneous tissues.  All extruded cement is removed and once the cement is hard the permanent 12.5 mm posterior stabilized rotating platform insert is placed into the tibial tray.      The wound is copiously irrigated with saline solution and the extensor mechanism closed over a hemovac drain with #1 V-loc suture. The tourniquet is released for a total tourniquet time of 32  minutes. Flexion against gravity is 140 degrees and the patella tracks normally. Subcutaneous tissue is closed with 2.0 vicryl and subcuticular with running 4.0 Monocryl. The incision is cleaned and dried and steri-strips and a bulky sterile dressing are applied. The limb is placed into a knee immobilizer and the patient is awakened and transported to recovery in stable condition.      Please note that a surgical assistant was a medical necessity for this procedure in order to perform it in a safe and expeditious manner. Surgical assistant was necessary to retract the ligaments and vital neurovascular structures to prevent injury to them and also necessary for proper positioning of the limb to allow for anatomic placement of the prosthesis.   Dione Plover Fredick Schlosser, MD    08/03/2014, 1:18 PM

## 2014-08-03 NOTE — H&P (View-Only) (Signed)
[name] Glass DOB: 16-Apr-1941 Divorced / Language: [name] Glass / Race: White Male Date of Admission:  08/03/2014 CC:  Right Knee Pain History of Present Illness ([name] Glass L. [name] Glass III PA-C; 07/16/2014 4:54 PM) The patient is a 74 year old male who comes in for a preoperative History and Physical. The patient is scheduled for a right total knee arthroplasty to be performed by Dr. Dione Plover. [name], MD at [facility] on 08-03-2014. The patient is a 74 year old male who presents today for follow up of their knee. The patient is being followed for their right knee pain and osteoarthritis. They are now 1 year(s) (and 9 months) out from the last visit. Symptoms reported today include: pain, aching, giving way and instability, while the patient does not report symptoms of: swelling. The patient feels that they are doing poorly and report their pain level to be moderate. Current treatment includes: activity modification. The following medication has been used for pain control: Aleve in the morning and Tylenol at night (as well as an OTC topical rub). The patient indicates that they have questions or concerns today regarding going ahead with surgery. [name] Glass says his knee is getting progressively worse. It is now limiting what he can and cannot do. He has a lot of pain, but said that the pain itself is tolerable, but the disc function is what is bothering him the most. He would like to remain extremely active, but the knee is preventing him from doing so. He has had cortisone and Visco supplement injections in the past without tremendous benefit. He is ready to proceed with surgery. They have been treated conservatively in the past for the above stated problem and despite conservative measures, they continue to have progressive pain and severe functional limitations and dysfunction. They have failed non-operative management including home exercise, medications, and injections. It is felt that they would  benefit from undergoing total joint replacement. Risks and benefits of the procedure have been discussed with the patient and they elect to proceed with surgery. There are no active contraindications to surgery such as ongoing infection or rapidly progressive neurological disease.  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) Osteoarthritis, knee (M17.9) Pain in joint, lower leg (719.46)02/22/2005 Sprain/strain, shoulder/arm NOS (840.9)06/25/2005 Osteoarthrosis, local NOS, lower leg (715.36)10/10/2005 Disorder, shoulder region NEC (726.2)11/23/2005 Sprain/strain, knee, cruciate ligament (844.2)08/21/2006 Sleep Apnea Prostate Disease Enlarged Prostate Kidney Stone Impaired Vision Shingles High blood pressure Coronary Artery Disease/Heart Disease Hypercholesterolemia Stent 5 Hiatal Hernia Gastroesophageal Reflux Disease Barrett's Esophagus Hemorrhoids Measles Mumps Rubella  Allergies  No Known Drug Allergies10/28/2013 but he does not like to take Morphine- states it makes him paranoid  Family History ([name] Glass Monika Glass, III PA-C; 07/16/2014 4:47 PM) Cerebrovascular Accident grandmother mothers side Hypertension mother, father, grandmother mothers side and grandfather mothers side Heart disease in male family member before age 65 Cancer mother, grandmother mothers side, grandfather mothers side and grandmother fathers side Diabetes Mellitus grandmother fathers side and grandfather fathers side  Social History ([name] Glass Monika Glass, III PA-C; 08/02/2014 5:48 PM) Marital status divorced Number of flights of stairs before winded greater than 5 Tobacco use never smoker Living situation live alone Exercise Exercises weekly; does running / walking, individual sport and gym / weights Alcohol use current drinker; drinks hard liquor; 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 Telmisartan (80MG  Tablet, Oral daily) Active. Zetia (10MG  Tablet, Oral daily) Active. Armour Thyroid (60MG  Tablet, 2 Oral daily) Active. Acyclovir (200MG  Capsule, 3 Oral daily) Active. Femara (2.5MG  Tablet, Oral daily) Active. Viagra (100MG  Tablet, Oral as needed) Active. Testosterone Cypionate (1 injection Intramuscular once a week) Specific dose unknown - Active. Nandrolone Decanoate (1 injection Intramuscular once a week) Specific dose unknown - Active. Omnitrope (1 injection Subcutaneous 5 times per week) Specific dose unknown - Active. Aspirin EC (81MG  Tablet DR, Oral daily) Active. DHEA (25MG  Tablet, 2 Oral daily) Active. Super Omega-3 (1000MG  Capsule, 2000mg  Oral 4 per day) Active. Potassium Gluconate (595MG  Tablet, Oral daily) Active. Vitamin E (400UNIT Tablet, Oral daily) Active. Vitamin C ER (500MG  Tablet ER, Oral daily) Active. Aleve (Oral daily) Specific dose unknown - Active. Tylenol (325MG  Tablet, Oral at bedtime) Active. Omeprazole (20MG  Capsule DR, 2 Oral daily) Active. Multi Vitamin Mens (Oral) Active. Progesterone (100MG  Capsule, Oral) Active.  Past Surgical History ([name] Monika Glass, III PA-C; 07/16/2014 5:05 PM) Prostatectomy; Transurethral Date: 2013. Laser Procedure Inguinal Hernia Repair open: bilateral and multiple times; 1980's Tonsillectomy Sinus Surgery Date: 2006. Sleep Apnea Surgery Heart Stents 5 Stents via Cardiac Cath Procedure Arthroscopy of Shoulder bilateral Arthroscopy of Knee Date: 1995. bilateral Colon Polyp Removal - Colonoscopy Cataract  Surgery bilateral Vasectomy Fundiplication Date: 0623.  Review of Systems ([name] Glass [name] Glass III PA-C; 07/16/2014 4:46 PM) General Not Present- Chills, Fatigue, Fever, Memory Loss, Night Sweats, Weight Gain and Weight Loss. Skin Not Present- Eczema, Hives, Itching, Lesions and Rash. HEENT Not Present- Dentures, Double Vision, Headache, Hearing Loss, Tinnitus and Visual Loss. Respiratory Not Present- Allergies, Chronic Cough, Coughing up blood, Shortness of breath at rest and Shortness of breath with exertion. Cardiovascular Not Present- Chest Pain, Difficulty Breathing Lying Down, Murmur, Palpitations, Racing/skipping heartbeats and Swelling. Gastrointestinal Not Present- Abdominal Pain, Bloody Stool, Constipation, Diarrhea, Difficulty Swallowing, Heartburn, Jaundice, Loss of appetitie, Nausea and Vomiting. Male Genitourinary Present- Urinating at Night. Not Present- Blood in Urine, Discharge, Flank Pain, Incontinence, Painful Urination, Urgency, Urinary frequency, Urinary Retention and Weak urinary stream. Musculoskeletal Present- Joint Pain. Not Present- Back Pain, Joint Swelling, Morning Stiffness, Muscle Pain, Muscle Weakness and Spasms. Neurological Not Present- Blackout spells, Difficulty with balance, Dizziness, Paralysis, Tremor and Weakness. Psychiatric Not Present- Insomnia.  Vitals (Hutson Luft L. Laryssa Hassing III PA-C; 07/16/2014 5:07 PM) 07/16/2014 5:06 PM Weight: 221 lb Height: 72.25in Weight was reported by patient. Height was reported by patient. Body Surface Area: 2.23 m Body Mass Index: 29.77 kg/m  BP: 140/70 (Sitting, Left Arm, Standard)  Physical Exam (Paislyn Domenico L. Nishant Schrecengost III PA-C; 07/16/2014 5:09 PM) General Mental Status -Alert, cooperative and good historian. General Appearance-pleasant, Not in acute distress. Orientation-Oriented X3. Build & Nutrition-Well nourished and Well developed.  Head and Neck Head-normocephalic, atraumatic  . Neck Global Assessment - supple, no bruit auscultated on the right, no bruit auscultated on the left.  Eye Pupil - Bilateral-Regular and Round. Motion - Bilateral-EOMI.  Chest and Lung Exam Auscultation Breath sounds - clear at anterior chest wall and clear at posterior chest wall. Adventitious sounds - No Adventitious sounds.  Cardiovascular Auscultation Rhythm - Regular rate and rhythm. Heart Sounds - S1 WNL and S2 WNL. Murmurs & Other Heart Sounds: Murmur 1 - Location - Aortic Area. Timing - Mid-systolic. Grade - II/VI.  Character - Low pitched.  Abdomen Palpation/Percussion Tenderness - Abdomen is non-tender to palpation. Rigidity (guarding) - Abdomen is soft. Auscultation Auscultation of the abdomen reveals - Bowel sounds normal.  Male Genitourinary Note: Not done, not pertinent to present illness   Musculoskeletal Note: Well developed male, in no distress. His left hip shows normal range of motion and no discomfort. His left knee shows no effusion. Range of motion is 0 to 125 degrees. There is marked crepitus on range of motion of the left knee. There is tenderness, medial greater than lateral with no instability noted. Pulses, sensation, and motor are intact distally. Right knee exam is unremarkable.  RADIOGRAPHS: AP and lateral of both knees show that he has bone on bone arthritis in the medial and patellofemoral compartments of the left knee. The right knee has minimal change.   Assessment & Plan (Tangia Pinard L. 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 Pt Education - [name] Glass - Postoperative Constipation Education: discussed with patient and provided information. Note:Surgical Plans: Right Total Knee Repalcement  Disposition: Home  PCP: Dr. Briscoe Deutscher Cards: Dr. [name]  Topical TXA -  CAD and Heart Stents  Anesthesia Issues: None  Signed electronically by Tramon Crescenzo Monika Glass, III PA-C

## 2014-08-03 NOTE — Interval H&P Note (Signed)
History and Physical Interval Note:  08/03/2014 11:31 AM  [name] Glass  has presented today for surgery, with the diagnosis of OA RIGHT KNEE   The various methods of treatment have been discussed with the patient and family. After consideration of risks, benefits and other options for treatment, the patient has consented to  Procedure(s): TOTAL RIGHT KNEE ARTHROPLASTY (Right) as a surgical intervention .  The patient's history has been reviewed, patient examined, no change in status, stable for surgery.  I have reviewed the patient's chart and labs.  Questions were answered to the patient's satisfaction.     [name]

## 2014-08-03 NOTE — Anesthesia Procedure Notes (Signed)
Procedure Name: Intubation Date/Time: 08/03/2014 12:17 PM Performed by: Maxwell Caul Pre-anesthesia Checklist: Patient identified, Emergency Drugs available, Suction available and Patient being monitored Patient Re-evaluated:Patient Re-evaluated prior to inductionOxygen Delivery Method: Circle System Utilized Preoxygenation: Pre-oxygenation with 100% oxygen Intubation Type: IV induction Ventilation: Mask ventilation without difficulty Laryngoscope Size: Mac and 4 Grade View: Grade I Tube type: Oral Tube size: 7.5 mm Number of attempts: 1 Airway Equipment and Method: Stylet and Oral airway Placement Confirmation: ETT inserted through vocal cords under direct vision,  positive ETCO2 and breath sounds checked- equal and bilateral Secured at: 21 cm Tube secured with: Tape Dental Injury: Teeth and Oropharynx as per pre-operative assessment

## 2014-08-03 NOTE — Anesthesia Postprocedure Evaluation (Signed)
  Anesthesia Post-op Note  Patient: [name] Glass  Procedure(s) Performed: Procedure(s) (LRB): TOTAL RIGHT KNEE ARTHROPLASTY (Right)  Patient Location: PACU  Anesthesia Type: General  Level of Consciousness: awake and alert   Airway and Oxygen Therapy: Patient Spontanous Breathing  Post-op Pain: mild  Post-op Assessment: Post-op Vital signs reviewed, Patient's Cardiovascular Status Stable, Respiratory Function Stable, Patent Airway and No signs of Nausea or vomiting  Last Vitals:  Filed Vitals:   08/03/14 1415  BP: 151/96  Pulse: 76  Temp:   Resp: 15    Post-op Vital Signs: stable   Complications: No apparent anesthesia complications

## 2014-08-03 NOTE — Transfer of Care (Signed)
Immediate Anesthesia Transfer of Care Note  Patient: [name] Glass  Procedure(s) Performed: Procedure(s) (LRB): TOTAL RIGHT KNEE ARTHROPLASTY (Right)  Patient Location: PACU  Anesthesia Type: General  Level of Consciousness: sedated, patient cooperative and responds to stimulation  Airway & Oxygen Therapy: Patient Spontanous Breathing and Patient connected to face mask oxgen  Post-op Assessment: Report given to PACU RN and Post -op Vital signs reviewed and stable  Post vital signs: Reviewed and stable  Complications: No apparent anesthesia complications

## 2014-08-04 LAB — BASIC METABOLIC PANEL
ANION GAP: 7 (ref 5–15)
BUN: 13 mg/dL (ref 6–23)
CALCIUM: 9.1 mg/dL (ref 8.4–10.5)
CO2: 29 mmol/L (ref 19–32)
Chloride: 99 mmol/L (ref 96–112)
Creatinine, Ser: 1.04 mg/dL (ref 0.50–1.35)
GFR calc non Af Amer: 69 mL/min — ABNORMAL LOW (ref >90)
GFR, EST AFRICAN AMERICAN: 80 mL/min — AB (ref >90)
Glucose, Bld: 144 mg/dL — ABNORMAL HIGH (ref 70–99)
POTASSIUM: 4.6 mmol/L (ref 3.5–5.1)
SODIUM: 135 mmol/L (ref 135–145)

## 2014-08-04 LAB — CBC
HCT: 47.7 % (ref 39.0–52.0)
HEMOGLOBIN: 15.4 g/dL (ref 13.0–17.0)
MCH: 30.1 pg (ref 26.0–34.0)
MCHC: 32.3 g/dL (ref 30.0–36.0)
MCV: 93.3 fL (ref 78.0–100.0)
Platelets: 304 10*3/uL (ref 150–400)
RBC: 5.11 MIL/uL (ref 4.22–5.81)
RDW: 17.8 % — AB (ref 11.5–15.5)
WBC: 13.2 10*3/uL — ABNORMAL HIGH (ref 4.0–10.5)

## 2014-08-04 MED ORDER — IRBESARTAN 300 MG PO TABS
300.0000 mg | ORAL_TABLET | Freq: Every day | ORAL | Status: DC
  Administered 2014-08-05: 300 mg via ORAL
  Filled 2014-08-04: qty 1

## 2014-08-04 MED ORDER — OMEPRAZOLE 20 MG PO CPDR
20.0000 mg | DELAYED_RELEASE_CAPSULE | Freq: Two times a day (BID) | ORAL | Status: DC
  Administered 2014-08-04 – 2014-08-05 (×2): 20 mg via ORAL
  Filled 2014-08-04 (×4): qty 1

## 2014-08-04 MED ORDER — NON FORMULARY: 20.0000 mg | Freq: Two times a day (BID) | Status: DC

## 2014-08-04 NOTE — Progress Notes (Signed)
Physical Therapy Treatment Patient Details Name: KHAMARI SHEEHAN MRN: [id number] DOB: [birth year] Today's Date: 08/04/2014    History of Present Illness 74 yo male s/p R TKA 08/03/14.     PT Comments    Progressing with mobility. Plan is for d/c on tomorrow. Will practice steps.   Follow Up Recommendations  Home health PT     Equipment Recommendations  None recommended by PT    Recommendations for Other Services OT consult     Precautions / Restrictions Precautions Precautions: Fall;Knee Required Braces or Orthoses: Knee Immobilizer - Right Knee Immobilizer - Right: Discontinue once straight leg raise with < 10 degree lag Restrictions Weight Bearing Restrictions: No RLE Weight Bearing: Weight bearing as tolerated    Mobility  Bed Mobility Overal bed mobility: Needs Assistance Bed Mobility: Supine to Sit;Sit to Supine     Supine to sit: Min assist Sit to supine: Min assist   General bed mobility comments: assist for R LE.   Transfers Overall transfer level: Needs assistance Equipment used: Rolling walker (2 wheeled) Transfers: Sit to/from Stand Sit to Stand: From elevated surface;Min assist         General transfer comment: verbal cues for hand placement and LE management.  Ambulation/Gait Ambulation/Gait assistance: Min assist Ambulation Distance (Feet): 115 Feet Assistive device: Rolling walker (2 wheeled) Gait Pattern/deviations: Step-to pattern;Decreased stride length;Antalgic;Trunk flexed     General Gait Details: Assist to stabilize. VCs safety, seqeunce.    Stairs            Wheelchair Mobility    Modified Rankin (Stroke Patients Only)       Balance                                    Cognition Arousal/Alertness: Awake/alert Behavior During Therapy: WFL for tasks assessed/performed Overall Cognitive Status: Within Functional Limits for tasks assessed                      Exercises      General Comments         Pertinent Vitals/Pain Pain Assessment: 0-10 Pain Score: 6  Pain Location: R knee Pain Descriptors / Indicators: Aching;Sore;Throbbing Pain Intervention(s): Monitored during session;Ice applied;Repositioned    Home Living Family/patient expects to be discharged to:: Private residence Living Arrangements: Spouse/significant other   Type of Home: House Home Access: Stairs to enter   Home Layout: Multi-level;Able to live on main level with bedroom/bathroom Home Equipment: Gilford Rile - 2 wheels;Bedside commode      Prior Function Level of Independence: Independent          PT Goals (current goals can now be found in the care plan section) Acute Rehab PT Goals Patient Stated Goal: regain independence Progress towards PT goals: Progressing toward goals    Frequency  7X/week    PT Plan Current plan remains appropriate    Co-evaluation             End of Session Equipment Utilized During Treatment: Gait belt;Right knee immobilizer Activity Tolerance: Patient tolerated treatment well Patient left: in bed;with call bell/phone within reach     Time: 1340-1359 PT Time Calculation (min) (ACUTE ONLY): 19 min  Charges:  $Gait Training: 8-22 mins                    G Codes:      Weston Anna, MPT Pager: [phone]

## 2014-08-04 NOTE — Progress Notes (Signed)
   Subjective: 1 Day Post-Op Procedure(s) (LRB): TOTAL RIGHT KNEE ARTHROPLASTY (Right) Patient reports pain as mild and moderate.   Tough night on the evening Patient seen in rounds with Dr. Wynelle Link. Patient is well, but has had some minor complaints of pain in the knee, requiring pain medications We will start therapy today.  Plan is to go Home after hospital stay.  Objective: Vital signs in last 24 hours: Temp:  [97.1 F (36.2 C)-98.8 F (37.1 C)] 98.5 F (36.9 C) (03/01 0349) Pulse Rate:  [61-86] 61 (03/01 0608) Resp:  [12-20] 18 (03/01 0732) BP: (150-182)/(78-114) 157/88 mmHg (03/01 0608) SpO2:  [90 %-98 %] 93 % (03/01 0732) Weight:  [99.791 kg (220 lb)] 99.791 kg (220 lb) (02/29 1017)  Intake/Output from previous day:  Intake/Output Summary (Last 24 hours) at 08/04/14 0851 Last data filed at 08/04/14 0608  Gross per 24 hour  Intake   2765 ml  Output 2754.5 ml  Net   10.5 ml    Intake/Output this shift: UOP 1600  Labs:  Recent Labs  08/04/14 0437  HGB 15.4    Recent Labs  08/04/14 0437  WBC 13.2*  RBC 5.11  HCT 47.7  PLT 304    Recent Labs  08/04/14 0437  NA 135  K 4.6  CL 99  CO2 29  BUN 13  CREATININE 1.04  GLUCOSE 144*  CALCIUM 9.1   No results for input(s): LABPT, INR in the last 72 hours.  EXAM General - Patient is Alert, Appropriate and Oriented Extremity - Neurovascular intact Sensation intact distally Dorsiflexion/Plantar flexion intact Dressing - dressing C/D/I Motor Function - intact, moving foot and toes well on exam.  Hemovac pulled without difficulty.  Past Medical History  Diagnosis Date  . GERD (gastroesophageal reflux disease)   . Coronary artery disease   . Hypothyroidism   . Sleep apnea     hx of, surgery to reverse  . H/O hiatal hernia   . Arthritis   . Hyperlipidemia     statin intolerant, under control  . Hypertension     under control  . H/O bronchitis   . History of kidney stones last in 1976     Assessment/Plan: 1 Day Post-Op Procedure(s) (LRB): TOTAL RIGHT KNEE ARTHROPLASTY (Right) Principal Problem:   OA (osteoarthritis) of knee  Estimated body mass index is 29.03 kg/(m^2) as calculated from the following:   Height as of this encounter: 6\' 1"  (1.854 m).   Weight as of this encounter: 99.791 kg (220 lb). Up with therapy Plan for discharge tomorrow Discharge home with home health  DVT Prophylaxis - Xarelto Weight-Bearing as tolerated to right leg D/C O2 and Pulse OX and try on Room Air  Arlee Muslim, PA-C Orthopaedic Surgery 08/04/2014, 8:51 AM

## 2014-08-04 NOTE — Evaluation (Signed)
Physical Therapy Evaluation Patient Details Name: RILEE WENDLING MRN: [id number] DOB: 1940-12-18 Today's Date: 08/04/2014   History of Present Illness  74 yo male s/p R TKA 08/03/14.   Clinical Impression  On eval, pt required Min assist for mobility-able to ambulate ~65 feet with RW. Pt tolerated activity well. Pain rated 6/10 with activity.     Follow Up Recommendations Home health PT    Equipment Recommendations  None recommended by PT    Recommendations for Other Services OT consult     Precautions / Restrictions Precautions Precautions: Fall;Knee Required Braces or Orthoses: Knee Immobilizer - Right Knee Immobilizer - Right: Discontinue once straight leg raise with < 10 degree lag Restrictions Weight Bearing Restrictions: No RLE Weight Bearing: Weight bearing as tolerated      Mobility  Bed Mobility Overal bed mobility: Needs Assistance Bed Mobility: Supine to Sit     Supine to sit: Min assist     General bed mobility comments: assist for R LE.   Transfers Overall transfer level: Needs assistance Equipment used: Rolling walker (2 wheeled) Transfers: Sit to/from Stand Sit to Stand: Min assist;From elevated surface         General transfer comment: Assist to rise, stabilize, control descent. VCs safety, technique, hand placement.   Ambulation/Gait Ambulation/Gait assistance: Min assist Ambulation Distance (Feet): 65 Feet Assistive device: Rolling walker (2 wheeled) Gait Pattern/deviations: Step-to pattern;Decreased stride length;Antalgic     General Gait Details: Assist to stabilize. VCs safety, seqeunce.   Stairs            Wheelchair Mobility    Modified Rankin (Stroke Patients Only)       Balance                                             Pertinent Vitals/Pain Pain Assessment: 0-10 Pain Score: 6  Pain Location: R knee with activity Pain Descriptors / Indicators: Aching;Sore Pain Intervention(s): Monitored  during session;Repositioned;Ice applied    Home Living Family/patient expects to be discharged to:: Private residence Living Arrangements: Spouse/significant other   Type of Home: House Home Access: Stairs to enter   [employer] of Steps: 2-garage Home Layout: Multi-level;Able to live on main level with bedroom/bathroom Home Equipment: Gilford Rile - 2 wheels;Bedside commode      Prior Function Level of Independence: Independent               Hand Dominance        Extremity/Trunk Assessment   Upper Extremity Assessment: Defer to OT evaluation           Lower Extremity Assessment: RLE deficits/detail RLE Deficits / Details: at least: hip flex 3-/5, moves ankle well    Cervical / Trunk Assessment: Normal  Communication   Communication: No difficulties  Cognition Arousal/Alertness: Awake/alert Behavior During Therapy: WFL for tasks assessed/performed Overall Cognitive Status: Within Functional Limits for tasks assessed                      General Comments      Exercises Total Joint Exercises Ankle Circles/Pumps: AROM;Both;10 reps;Supine Quad Sets: AROM;Both;10 reps;Supine Heel Slides: AAROM;Right;10 reps;Supine Hip ABduction/ADduction: AAROM;10 reps;Right;Supine Straight Leg Raises: AAROM;Right;10 reps;Supine Goniometric ROM: 10-45 degrees      Assessment/Plan    PT Assessment Patient needs continued PT services  PT Diagnosis Difficulty walking;Acute pain   PT Problem List  Decreased strength;Decreased range of motion;Decreased activity tolerance;Decreased balance;Decreased mobility;Decreased knowledge of use of DME;Pain;Decreased knowledge of precautions  PT Treatment Interventions DME instruction;Gait training;Stair training;Functional mobility training;Therapeutic activities;Therapeutic exercise;Patient/family education   PT Goals (Current goals can be found in the Care Plan section) Acute Rehab PT Goals Patient Stated Goal: regain  independence PT Goal Formulation: With patient Time For Goal Achievement: 08/11/14 Potential to Achieve Goals: Good    Frequency 7X/week   Barriers to discharge        Co-evaluation               End of Session Equipment Utilized During Treatment: Gait belt;Right knee immobilizer Activity Tolerance: Patient tolerated treatment well Patient left: in chair;with call bell/phone within reach           Time: 0940-1003 PT Time Calculation (min) (ACUTE ONLY): 23 min   Charges:   PT Evaluation $Initial PT Evaluation Tier I: 1 Procedure PT Treatments $Gait Training: 8-22 mins   PT G Codes:        Weston Anna, MPT Pager: [phone]

## 2014-08-04 NOTE — Progress Notes (Signed)
The patient reported to me that a year or so after removal of his prostate, he had episodes of hematuria which resolved after stopping Plavix. I urged him to monitor closely for hematuria while on Xarelto.  Romeo Rabon, PharmD, pager [phone]. 08/04/2014,8:33 AM.

## 2014-08-04 NOTE — Evaluation (Signed)
Occupational Therapy Evaluation Patient Details Name: [name] Glass MRN: [id number] DOB: [birth year] Today's Date: 08/04/2014    History of Present Illness 74 yo male s/p R TKA 08/03/14.    Clinical Impression   Pt with 4/10 pain at start of session and increased to 7/10 with activity. Practiced toilet transfers with 3in1 and stood to use urinal. Will follow to progress ADL independence for d/c home with significant other.    Follow Up Recommendations  No OT follow up    Equipment Recommendations  None recommended by OT    Recommendations for Other Services       Precautions / Restrictions Precautions Precautions: Fall;Knee Required Braces or Orthoses: Knee Immobilizer - Right Knee Immobilizer - Right: Discontinue once straight leg raise with < 10 degree lag Restrictions Weight Bearing Restrictions: No RLE Weight Bearing: Weight bearing as tolerated      Mobility Bed Mobility           Transfers Overall transfer level: Needs assistance Equipment used: Rolling walker (2 wheeled) Transfers: Sit to/from Stand Sit to Stand: Min assist         General transfer comment: verbal cues for hand placement and LE management.    Balance                                            ADL Overall ADL's : Needs assistance/impaired Eating/Feeding: Independent;Sitting   Grooming: Wash/dry hands;Set up;Sitting   Upper Body Bathing: Set up;Sitting   Lower Body Bathing: Moderate assistance;Sit to/from stand   Upper Body Dressing : Set up;Sitting   Lower Body Dressing: Maximal assistance;Sit to/from stand Lower Body Dressing Details (indicate cue type and reason): due to pain Toilet Transfer: Minimal assistance;Ambulation;BSC;RW   Toileting- Clothing Manipulation and Hygiene: Minimal assistance;Sit to/from stand         General ADL Comments: stood to use urinal in the bathroom and noted pt to be very shaky in Ues with using walker. Pt states this is  from the effort to stand, etc and also due to pain. Practiced on and off 3in1 and needed verbal cues for safety with the walker. Pt states his walker may not fit  all the way through the bathroom to the commode so recommended that pt may need to use 3in1 as BSC until he can safely manuever into the bathroom without walker as determined by [facility] therapy. Discussed use of 3in1 as shower chair also. Pt states girlfriend will assist with LB self care.      Vision     Perception     Praxis      Pertinent Vitals/Pain Pain Assessment: 0-10 Pain Score: 4  Pain Location: R knee Pain Descriptors / Indicators: Aching Pain Intervention(s): Monitored during session;Repositioned;Ice applied     Hand Dominance     Extremity/Trunk Assessment Upper Extremity Assessment Upper Extremity Assessment: Overall WFL for tasks assessed (note pt to be shaky in UEs with using walker. pt states from pain and effort)      Cervical / Trunk Assessment Cervical / Trunk Assessment: Normal   Communication Communication Communication: No difficulties   Cognition Arousal/Alertness: Awake/alert Behavior During Therapy: WFL for tasks assessed/performed Overall Cognitive Status: Within Functional Limits for tasks assessed                     General Comments  Exercises       Shoulder Instructions      Home Living Family/patient expects to be discharged to:: Private residence Living Arrangements: Spouse/significant other   Type of Home: House Home Access: Stairs to enter [employer] of Steps: 2-garage   Home Layout: Multi-level;Able to live on main level with bedroom/bathroom     Bathroom Shower/Tub: Occupational psychologist: Standard     Home Equipment: Environmental consultant - 2 wheels;Bedside commode          Prior Functioning/Environment Level of Independence: Independent             OT Diagnosis: Generalized weakness   OT Problem List: Decreased strength;Decreased  knowledge of use of DME or AE   OT Treatment/Interventions: Self-care/ADL training;Patient/family education;Therapeutic activities;DME and/or AE instruction    OT Goals(Current goals can be found in the care plan section) Acute Rehab OT Goals Patient Stated Goal: regain independence OT Goal Formulation: With patient Time For Goal Achievement: 08/11/14 Potential to Achieve Goals: Good  OT Frequency: Min 2X/week   Barriers to D/C:            Co-evaluation              End of Session Equipment Utilized During Treatment: Gait belt;Rolling walker;Right knee immobilizer  Activity Tolerance: Patient limited by pain Patient left: in chair;with call bell/phone within reach   Time: 1119-1150 OT Time Calculation (min): 31 min Charges:  OT General Charges $OT Visit: 1 Procedure OT Evaluation $Initial OT Evaluation Tier I: 1 Procedure OT Treatments $Therapeutic Activity: 8-22 mins G-Codes:    Jules Schick  [phone] 08/04/2014, 12:11 PM

## 2014-08-05 LAB — CBC
HEMATOCRIT: 45.6 % (ref 39.0–52.0)
Hemoglobin: 14.8 g/dL (ref 13.0–17.0)
MCH: 30.3 pg (ref 26.0–34.0)
MCHC: 32.5 g/dL (ref 30.0–36.0)
MCV: 93.3 fL (ref 78.0–100.0)
PLATELETS: 271 10*3/uL (ref 150–400)
RBC: 4.89 MIL/uL (ref 4.22–5.81)
RDW: 17.8 % — AB (ref 11.5–15.5)
WBC: 12.3 10*3/uL — ABNORMAL HIGH (ref 4.0–10.5)

## 2014-08-05 LAB — BASIC METABOLIC PANEL
ANION GAP: 6 (ref 5–15)
BUN: 18 mg/dL (ref 6–23)
CHLORIDE: 99 mmol/L (ref 96–112)
CO2: 29 mmol/L (ref 19–32)
Calcium: 9.2 mg/dL (ref 8.4–10.5)
Creatinine, Ser: 1.03 mg/dL (ref 0.50–1.35)
GFR calc Af Amer: 81 mL/min — ABNORMAL LOW (ref >90)
GFR calc non Af Amer: 69 mL/min — ABNORMAL LOW (ref >90)
Glucose, Bld: 109 mg/dL — ABNORMAL HIGH (ref 70–99)
POTASSIUM: 4.3 mmol/L (ref 3.5–5.1)
SODIUM: 134 mmol/L — AB (ref 135–145)

## 2014-08-05 MED ORDER — METHOCARBAMOL 500 MG PO TABS: 500.0000 mg | ORAL_TABLET | Freq: Four times a day (QID) | ORAL | Status: DC | PRN

## 2014-08-05 MED ORDER — TRAMADOL HCL 50 MG PO TABS: 50.0000 mg | ORAL_TABLET | Freq: Four times a day (QID) | ORAL | Status: DC | PRN

## 2014-08-05 MED ORDER — HYDROMORPHONE HCL 2 MG PO TABS: 2.0000 mg | ORAL_TABLET | ORAL | Status: DC | PRN

## 2014-08-05 MED ORDER — RIVAROXABAN 10 MG PO TABS: 10.0000 mg | ORAL_TABLET | Freq: Every day | ORAL | Status: DC

## 2014-08-05 NOTE — Progress Notes (Signed)
   Subjective: 2 Days Post-Op Procedure(s) (LRB): TOTAL RIGHT KNEE ARTHROPLASTY (Right) Patient reports pain as mild and moderate.   Patient seen in rounds for Dr. Wynelle Link. Patient is well, but has had some minor complaints of pain in the knee, requiring pain medications Patient is ready to go home  Objective: Vital signs in last 24 hours: Temp:  [98.4 F (36.9 C)-99.4 F (37.4 C)] 98.4 F (36.9 C) (03/02 0500) Pulse Rate:  [89-90] 89 (03/02 0500) Resp:  [16-18] 18 (03/02 0500) BP: (141-147)/(78-86) 141/86 mmHg (03/02 0500) SpO2:  [90 %-91 %] 90 % (03/02 0500)  Intake/Output from previous day:  Intake/Output Summary (Last 24 hours) at 08/05/14 1009 Last data filed at 08/05/14 0233  Gross per 24 hour  Intake    840 ml  Output   2100 ml  Net  -1260 ml     Labs:  Recent Labs  08/04/14 0437 08/05/14 0540  HGB 15.4 14.8    Recent Labs  08/04/14 0437 08/05/14 0540  WBC 13.2* 12.3*  RBC 5.11 4.89  HCT 47.7 45.6  PLT 304 271    Recent Labs  08/04/14 0437 08/05/14 0540  NA 135 134*  K 4.6 4.3  CL 99 99  CO2 29 29  BUN 13 18  CREATININE 1.04 1.03  GLUCOSE 144* 109*  CALCIUM 9.1 9.2   No results for input(s): LABPT, INR in the last 72 hours.  EXAM: General - Patient is Alert, Appropriate and Oriented Extremity - Neurovascular intact Sensation intact distally Dorsiflexion/Plantar flexion intact Incision - clean, dry, no drainage, healing Motor Function - intact, moving foot and toes well on exam.   Assessment/Plan: 2 Days Post-Op Procedure(s) (LRB): TOTAL RIGHT KNEE ARTHROPLASTY (Right) Procedure(s) (LRB): TOTAL RIGHT KNEE ARTHROPLASTY (Right) Past Medical History  Diagnosis Date  . GERD (gastroesophageal reflux disease)   . Coronary artery disease   . Hypothyroidism   . Sleep apnea     hx of, surgery to reverse  . H/O hiatal hernia   . Arthritis   . Hyperlipidemia     statin intolerant, under control  . Hypertension     under control  .  H/O bronchitis   . History of kidney stones last in 1976   Principal Problem:   OA (osteoarthritis) of knee  Estimated body mass index is 29.03 kg/(m^2) as calculated from the following:   Height as of this encounter: 6\' 1"  (1.854 m).   Weight as of this encounter: 99.791 kg (220 lb). Up with therapy Discharge home with home health Diet - Cardiac diet Follow up - in 2 weeks Activity - WBAT Disposition - Home Condition Upon Discharge - Good D/C Meds - See DC Summary DVT Prophylaxis - Xarelto  Arlee Muslim, PA-C Orthopaedic Surgery 08/05/2014, 10:09 AM

## 2014-08-05 NOTE — Progress Notes (Signed)
Discharge instructions given. Patient verbalized understanding and all questions were answered.  ?

## 2014-08-05 NOTE — Progress Notes (Signed)
Physical Therapy Treatment Patient Details Name: [name] Glass MRN: [id number] DOB: 01-27-1941 Today's Date: 08/05/2014    History of Present Illness 74 yo male s/p R TKA 08/03/14.     PT Comments    Progressing weith mobility. Practiced exercises, ambulation and stair negotiation. Significant other was present and participated with session as well. Issued stair climbing handout as well for them to read over again. All education completed. Pt/significant other ready to d/c.   Follow Up Recommendations  Home health PT     Equipment Recommendations  None recommended by PT    Recommendations for Other Services OT consult     Precautions / Restrictions Precautions Precautions: Knee;Fall Required Braces or Orthoses: Knee Immobilizer - Right Knee Immobilizer - Right: Discontinue once straight leg raise with < 10 degree lag Restrictions Weight Bearing Restrictions: No RLE Weight Bearing: Weight bearing as tolerated    Mobility  Bed Mobility Overal bed mobility: Needs Assistance Bed Mobility: Supine to Sit     Supine to sit: Min assist     General bed mobility comments: pt oob in recliner  Transfers Overall transfer level: Needs assistance Equipment used: Rolling walker (2 wheeled) Transfers: Sit to/from Stand Sit to Stand: Min assist         General transfer comment: verbal cues for hand placement and LE management. small amount of assist to rise, stabilize  Ambulation/Gait Ambulation/Gait assistance: Min guard Ambulation Distance (Feet): 80 Feet Assistive device: Rolling walker (2 wheeled) Gait Pattern/deviations: Step-to pattern;Trunk flexed;Decreased stance time - right;Antalgic     General Gait Details: close guard for safety. VCs safety, seqeunce.    Stairs Stairs: Yes Stairs assistance: Min assist Stair Management: Backwards;With walker Number of Stairs: 3 General stair comments: VCs safety, technique, sequence. Significant other present and assisted  with stabilizing walker. Recommended to pt/sig other that they have a 2nd person assist as well for safety.   Wheelchair Mobility    Modified Rankin (Stroke Patients Only)       Balance                                    Cognition Arousal/Alertness: Awake/alert Behavior During Therapy: Impulsive (cues to slow down) Overall Cognitive Status: Within Functional Limits for tasks assessed                      Exercises Total Joint Exercises Ankle Circles/Pumps: AROM;Both;10 reps;Supine Quad Sets: AROM;Both;10 reps;Supine Heel Slides: AAROM;Right;10 reps;Supine Hip ABduction/ADduction: AAROM;10 reps;Right;Supine Straight Leg Raises: AAROM;Right;10 reps;Supine Goniometric ROM: 10-45 degrees    General Comments General comments (skin integrity, edema, etc.): min assist to balance in standing to use urinal.       Pertinent Vitals/Pain Pain Assessment: 0-10 Pain Score: 7  Pain Location: R knee Pain Descriptors / Indicators: Sore Pain Intervention(s): Monitored during session;Ice applied;Repositioned    Home Living                      Prior Function            PT Goals (current goals can now be found in the care plan section) Progress towards PT goals: Progressing toward goals    Frequency  7X/week    PT Plan Current plan remains appropriate    Co-evaluation             End of Session Equipment Utilized During Treatment: Gait belt;Right knee  immobilizer Activity Tolerance: Patient tolerated treatment well Patient left: in chair;with call bell/phone within reach;with family/visitor present     Time: 1157-2620 PT Time Calculation (min) (ACUTE ONLY): 25 min  Charges:  $Gait Training: 23-37 mins                    G Codes:      Weston Anna, MPT Pager: [phone]

## 2014-08-05 NOTE — Care Management Note (Signed)
    Page 1 of 1   08/05/2014     12:46:52 PM CARE MANAGEMENT NOTE 08/05/2014  Patient:  NELL, SCHRACK   Account Number:  [id number]  Date Initiated:  08/05/2014  Documentation initiated by:  [facility]  Subjective/Objective Assessment:   adm: TOTAL RIGHT KNEE ARTHROPLASTY (Right)     Action/Plan:   discharge planning   Anticipated DC Date:  08/05/2014   Anticipated DC Plan:  [place]  CM consult      [facility]  HOME HEALTH   Choice offered to / List presented to:  C-1 Patient        [place] arranged  [place]   Status of service:  Completed, signed off Medicare Important Message given?   (If response is "NO", the following Medicare IM given date fields will be blank) Date Medicare IM given:   Medicare IM given by:   Date Additional Medicare IM given:   Additional Medicare IM given by:    Discharge Disposition:  [place]  Per UR Regulation:    If discussed at Long Length of Stay Meetings, dates discussed:    Comments:  08/05/14 12:40 Cm met with pt in room to offer choice of home health agency.  Pt chooses [facility] to render HHPT/OT. P has both a rolling walker and 3n1 at home.  Address and contact information verified by pt.  Referral emailed to [employer], Tim. NO other Cm needs were communicated. Mariane Masters, BSn, CM [phone].

## 2014-08-05 NOTE — Progress Notes (Signed)
Occupational Therapy Treatment Patient Details Name: [name] Glass MRN: [id number] DOB: [birth year] Today's Date: 08/05/2014    History of present illness 74 yo male s/p R TKA 08/03/14.    OT comments  Pt still min assist for functional toilet transfers due to pain and pt still shaky in Ues. Pt tending to be alittle impulsive at times during session. Significant other entered room during middle of session and educated provided on ADL. Feel HHOT would be beneficial to reinforce ADLs with pt and girlfriend. Advised pt to not shower but rather sponge bathe a few days as he is still so shaky and pain can be limiting. Pt agreeable. Recommend HHOT assess showering.     Follow Up Recommendations  Home health OT;Supervision/Assistance - 24 hour    Equipment Recommendations  None recommended by OT    Recommendations for Other Services      Precautions / Restrictions Precautions Precautions: Fall;Knee Required Braces or Orthoses: Knee Immobilizer - Right Knee Immobilizer - Right: Discontinue once straight leg raise with < 10 degree lag Restrictions Weight Bearing Restrictions: No RLE Weight Bearing: Weight bearing as tolerated       Mobility Bed Mobility Overal bed mobility: Needs Assistance Bed Mobility: Supine to Sit     Supine to sit: Min assist     General bed mobility comments: cues for hand placement to self assist. pt reaching for trapeze bar.   Transfers Overall transfer level: Needs assistance Equipment used: Rolling walker (2 wheeled) Transfers: Sit to/from Stand Sit to Stand: Min assist         General transfer comment: verbal cues for hand placement and LE management.    Balance                                   ADL                           Toilet Transfer: Minimal assistance;Ambulation;BSC;RW Toilet Transfer Details (indicate cue type and reason): pt still very shaky in UEs with functional transfers. Pt tending to step out of the  side of the walker with R LE.  Toileting- Clothing Manipulation and Hygiene: Minimal assistance;Sit to/from stand         General ADL Comments: Pt just receiving pain meds at start of session but states he felt ok to go ahead and get up. Pt is very shaky in Ues with functional transfers and required min assist for balance. He tended to move quickly at times and also displayed some decreased safety with the walker. He tended to step too far to R side of the walker at times which affected his balance. He also overeached to the sink in standing to wash hands and cautioned pt to make sure he steps closer to the sink with teh walker before he lets go of walker. Girlfriend entered room during session and several people in and out during session which made it more difficulty to provide smooth education during session. recommend pt sponge bathe initially and allow pain to be more controlled and pt to be not shaky before attempting to shower. Recommend HHOT assess shower transfers and pt agreeable to sponge bathe initially. Discussed how to adjust 3in1 to appropriate height and option of a stool to prop R LE on for toileting comfort. Educated at [employer] on KI use and when to wear (when up walking). Pt stating, "  I will know when I can stop wearing it." Again reinforced that therapist should be the one to advise pt when he can SLR on the R LE and doesnt need KI.       Vision                     Perception     Praxis      Cognition   Behavior During Therapy: Impulsive Overall Cognitive Status: Within Functional Limits for tasks assessed                       Extremity/Trunk Assessment               Exercises     Shoulder Instructions       General Comments      Pertinent Vitals/ Pain       Pain Assessment: 0-10 Pain Score: 7  Pain Location: R knee Pain Descriptors / Indicators: Aching Pain Intervention(s): Repositioned;Patient requesting pain meds-RN notified;Ice  applied  Home Living                                          Prior Functioning/Environment              Frequency Min 2X/week     Progress Toward Goals  OT Goals(current goals can now be found in the care plan section)  Progress towards OT goals:  (limited by pain and shakiness in Ues with activity)     Plan Discharge plan remains appropriate    Co-evaluation                 End of Session Equipment Utilized During Treatment: Gait belt;Rolling walker;Right knee immobilizer   Activity Tolerance Patient limited by pain   Patient Left in chair;with call bell/phone within reach;with family/visitor present   Nurse Communication          Time: 1020-1055 OT Time Calculation (min): 35 min  Charges: OT General Charges $OT Visit: 1 Procedure OT Treatments $Self Care/Home Management : 8-22 mins $Therapeutic Activity: 8-22 mins  Jules Schick  [phone] 08/05/2014, 11:17 AM

## 2014-08-05 NOTE — Discharge Summary (Signed)
Physician Discharge Summary   Patient ID: SCHYLER COUNSELL MRN: [id number] DOB/AGE: 08/06/1940 74 y.o.  Admit date: 08/03/2014 Discharge date: 08/05/2014  Primary Diagnosis:  Osteoarthritis Right knee(s)  Admission Diagnoses:  Past Medical History  Diagnosis Date  . GERD (gastroesophageal reflux disease)   . Coronary artery disease   . Hypothyroidism   . Sleep apnea     hx of, surgery to reverse  . H/O hiatal hernia   . Arthritis   . Hyperlipidemia     statin intolerant, under control  . Hypertension     under control  . H/O bronchitis   . History of kidney stones last in 1976   Discharge Diagnoses:   Principal Problem:   OA (osteoarthritis) of knee  Estimated body mass index is 29.03 kg/(m^2) as calculated from the following:   Height as of this encounter: 6' 1"  (1.854 m).   Weight as of this encounter: 99.791 kg (220 lb).  Procedure:  Procedure(s) (LRB): TOTAL RIGHT KNEE ARTHROPLASTY (Right)   Consults: None  HPI: [name] Glass is a 74 y.o. year old male with end stage OA of his right knee with progressively worsening pain and dysfunction. He has constant pain, with activity and at rest and significant functional deficits with difficulties even with ADLs. He has had extensive non-op management including analgesics, injections of cortisone and viscosupplements, and home exercise program, but remains in significant pain with significant dysfunction. Radiographs show bone on bone arthritis medial and patellofemoral. He presents now for right Total Knee Arthroplasty.   Laboratory Data: Admission on 08/03/2014, Discharged on 08/05/2014  Component Date Value Ref Range Status  . ABO/RH(D) 08/03/2014 O POS   Final  . Antibody Screen 08/03/2014 NEG   Final  . Sample Expiration 08/03/2014 08/06/2014   Final  . ABO/RH(D) 08/03/2014 O POS   Final  . WBC 08/04/2014 13.2* 4.0 - 10.5 K/uL Final  . RBC 08/04/2014 5.11  4.22 - 5.81 MIL/uL Final  . Hemoglobin 08/04/2014 15.4  13.0 -  17.0 g/dL Final  . HCT 08/04/2014 47.7  39.0 - 52.0 % Final  . MCV 08/04/2014 93.3  78.0 - 100.0 fL Final  . MCH 08/04/2014 30.1  26.0 - 34.0 pg Final  . MCHC 08/04/2014 32.3  30.0 - 36.0 g/dL Final  . RDW 08/04/2014 17.8* 11.5 - 15.5 % Final  . Platelets 08/04/2014 304  150 - 400 K/uL Final  . Sodium 08/04/2014 135  135 - 145 mmol/L Final  . Potassium 08/04/2014 4.6  3.5 - 5.1 mmol/L Final  . Chloride 08/04/2014 99  96 - 112 mmol/L Final  . CO2 08/04/2014 29  19 - 32 mmol/L Final  . Glucose, Bld 08/04/2014 144* 70 - 99 mg/dL Final  . BUN 08/04/2014 13  6 - 23 mg/dL Final  . Creatinine, Ser 08/04/2014 1.04  0.50 - 1.35 mg/dL Final  . Calcium 08/04/2014 9.1  8.4 - 10.5 mg/dL Final  . GFR calc non Af Amer 08/04/2014 69* >90 mL/min Final  . GFR calc Af Amer 08/04/2014 80* >90 mL/min Final   Comment: (NOTE) The eGFR has been calculated using the CKD EPI equation. This calculation has not been validated in all clinical situations. eGFR's persistently <90 mL/min signify possible Chronic Kidney Disease.   . Anion gap 08/04/2014 7  5 - 15 Final  . WBC 08/05/2014 12.3* 4.0 - 10.5 K/uL Final  . RBC 08/05/2014 4.89  4.22 - 5.81 MIL/uL Final  . Hemoglobin 08/05/2014 14.8  13.0 -  17.0 g/dL Final  . HCT 08/05/2014 45.6  39.0 - 52.0 % Final  . MCV 08/05/2014 93.3  78.0 - 100.0 fL Final  . MCH 08/05/2014 30.3  26.0 - 34.0 pg Final  . MCHC 08/05/2014 32.5  30.0 - 36.0 g/dL Final  . RDW 08/05/2014 17.8* 11.5 - 15.5 % Final  . Platelets 08/05/2014 271  150 - 400 K/uL Final  . Sodium 08/05/2014 134* 135 - 145 mmol/L Final  . Potassium 08/05/2014 4.3  3.5 - 5.1 mmol/L Final  . Chloride 08/05/2014 99  96 - 112 mmol/L Final  . CO2 08/05/2014 29  19 - 32 mmol/L Final  . Glucose, Bld 08/05/2014 109* 70 - 99 mg/dL Final  . BUN 08/05/2014 18  6 - 23 mg/dL Final  . Creatinine, Ser 08/05/2014 1.03  0.50 - 1.35 mg/dL Final  . Calcium 08/05/2014 9.2  8.4 - 10.5 mg/dL Final  . GFR calc non Af Amer  08/05/2014 69* >90 mL/min Final  . GFR calc Af Amer 08/05/2014 81* >90 mL/min Final   Comment: (NOTE) The eGFR has been calculated using the CKD EPI equation. This calculation has not been validated in all clinical situations. eGFR's persistently <90 mL/min signify possible Chronic Kidney Disease.   Georgiann Hahn gap 08/05/2014 6  5 - 15 Final  Hospital Outpatient Visit on 07/28/2014  Component Date Value Ref Range Status  . Color, Urine 07/28/2014 YELLOW  YELLOW Final  . APPearance 07/28/2014 CLEAR  CLEAR Final  . Specific Gravity, Urine 07/28/2014 1.022  1.005 - 1.030 Final  . pH 07/28/2014 5.5  5.0 - 8.0 Final  . Glucose, UA 07/28/2014 NEGATIVE  NEGATIVE mg/dL Final  . Hgb urine dipstick 07/28/2014 NEGATIVE  NEGATIVE Final  . Bilirubin Urine 07/28/2014 NEGATIVE  NEGATIVE Final  . Ketones, ur 07/28/2014 NEGATIVE  NEGATIVE mg/dL Final  . Protein, ur 07/28/2014 30* NEGATIVE mg/dL Final  . Urobilinogen, UA 07/28/2014 0.2  0.0 - 1.0 mg/dL Final  . Nitrite 07/28/2014 NEGATIVE  NEGATIVE Final  . Leukocytes, UA 07/28/2014 SMALL* NEGATIVE Final  . MRSA, PCR 07/28/2014 NEGATIVE  NEGATIVE Final  . Staphylococcus aureus 07/28/2014 NEGATIVE  NEGATIVE Final   Comment:        The Xpert SA Assay (FDA approved for NASAL specimens in patients over 62 years of age), is one component of a comprehensive surveillance program.  Test performance has been validated by [facility] for patients greater than or equal to 40 year old. It is not intended to diagnose infection nor to guide or monitor treatment.   [name] aPTT 07/28/2014 30  24 - 37 seconds Final  . WBC 07/28/2014 6.1  4.0 - 10.5 K/uL Final  . RBC 07/28/2014 5.73  4.22 - 5.81 MIL/uL Final  . Hemoglobin 07/28/2014 17.6* 13.0 - 17.0 g/dL Final  . HCT 07/28/2014 52.6* 39.0 - 52.0 % Final  . MCV 07/28/2014 91.8  78.0 - 100.0 fL Final  . MCH 07/28/2014 30.7  26.0 - 34.0 pg Final  . MCHC 07/28/2014 33.5  30.0 - 36.0 g/dL Final  . RDW 07/28/2014 18.5*  11.5 - 15.5 % Final  . Platelets 07/28/2014 254  150 - 400 K/uL Final  . Sodium 07/28/2014 140  135 - 145 mmol/L Final  . Potassium 07/28/2014 4.4  3.5 - 5.1 mmol/L Final  . Chloride 07/28/2014 106  96 - 112 mmol/L Final  . CO2 07/28/2014 25  19 - 32 mmol/L Final  . Glucose, Bld 07/28/2014 87  70 - 99  mg/dL Final  . BUN 07/28/2014 25* 6 - 23 mg/dL Final  . Creatinine, Ser 07/28/2014 1.21  0.50 - 1.35 mg/dL Final  . Calcium 07/28/2014 9.8  8.4 - 10.5 mg/dL Final  . Total Protein 07/28/2014 7.1  6.0 - 8.3 g/dL Final  . Albumin 07/28/2014 3.8  3.5 - 5.2 g/dL Final  . AST 07/28/2014 27  0 - 37 U/L Final  . ALT 07/28/2014 27  0 - 53 U/L Final  . Alkaline Phosphatase 07/28/2014 54  39 - 117 U/L Final  . Total Bilirubin 07/28/2014 0.8  0.3 - 1.2 mg/dL Final  . GFR calc non Af Amer 07/28/2014 57* >90 mL/min Final  . GFR calc Af Amer 07/28/2014 66* >90 mL/min Final   Comment: (NOTE) The eGFR has been calculated using the CKD EPI equation. This calculation has not been validated in all clinical situations. eGFR's persistently <90 mL/min signify possible Chronic Kidney Disease.   . Anion gap 07/28/2014 9  5 - 15 Final  . Prothrombin Time 07/28/2014 12.9  11.6 - 15.2 seconds Final  . INR 07/28/2014 0.96  0.00 - 1.49 Final  . WBC, UA 07/28/2014 3-6  <3 WBC/hpf Final  . Casts 07/28/2014 HYALINE CASTS* NEGATIVE Final     X-Rays:No results found.  EKG: Orders placed or performed in visit on 07/10/14  . EKG 12-Lead     Hospital Course: CHAUNCE WINKELS is a 74 y.o. who was admitted to [facility]. They were brought to the operating room on 08/03/2014 and underwent Procedure(s): TOTAL RIGHT KNEE ARTHROPLASTY.  Patient tolerated the procedure well and was later transferred to the recovery room and then to the orthopaedic floor for postoperative care.  They were given PO and IV analgesics for pain control following their surgery.  They were given 24 hours of postoperative antibiotics of    Anti-infectives    Start     Dose/Rate Route Frequency Ordered Stop   08/04/14 1000  acyclovir (ZOVIRAX) 200 MG capsule 400 mg  Status:  Discontinued     400 mg Oral Daily 08/03/14 1458 08/05/14 1544   08/03/14 2200  acyclovir (ZOVIRAX) 200 MG capsule 200 mg  Status:  Discontinued     200 mg Oral Daily at bedtime 08/03/14 1506 08/05/14 1544   08/03/14 1830  ceFAZolin (ANCEF) IVPB 2 g/50 mL premix     2 g 100 mL/hr over 30 Minutes Intravenous Every 6 hours 08/03/14 1458 08/04/14 0051   08/03/14 0945  ceFAZolin (ANCEF) IVPB 2 g/50 mL premix     2 g 100 mL/hr over 30 Minutes Intravenous On call to O.R. 08/03/14 0945 08/03/14 1220     and started on DVT prophylaxis in the form of Xarelto.   PT and OT were ordered for total joint protocol.  Discharge planning consulted to help with postop disposition and equipment needs.  Patient had a tough night on the evening of surgery.  They started to get up OOB with therapy on day one. Hemovac drain was pulled without difficulty.  Continued to work with therapy into day two.  Dressing was changed on day two and the incision was healing well.  Patient was seen in rounds and was ready to go home later on day two.   Discharge home with home health Diet - Cardiac diet Follow up - in 2 weeks Activity - WBAT Disposition - Home Condition Upon Discharge - Good D/C Meds - See DC Summary DVT Prophylaxis - Xarelto  Discharge Instructions    Call MD / Call 911    Complete by:  As directed   If you experience chest pain or shortness of breath, CALL 911 and be transported to the hospital emergency room.  If you develope a fever above 101 F, pus (white drainage) or increased drainage or redness at the wound, or calf pain, call your surgeon's office.     Change dressing    Complete by:  As directed   Change dressing daily with sterile 4 x 4 inch gauze dressing and apply TED hose. Do not submerge the incision under water.     Constipation Prevention     Complete by:  As directed   Drink plenty of fluids.  Prune juice may be helpful.  You may use a stool softener, such as Colace (over the counter) 100 mg twice a day.  Use MiraLax (over the counter) for constipation as needed.     Diet - low sodium heart healthy    Complete by:  As directed      Discharge instructions    Complete by:  As directed   Pick up stool softner and laxative for home use following surgery while on pain medications. Do not submerge incision under water. Please use good hand washing techniques while changing dressing each day. May shower starting three days after surgery. Please use a clean towel to pat the incision dry following showers. Continue to use ice for pain and swelling after surgery. Do not use any lotions or creams on the incision until instructed by your surgeon.  Take Xarelto for two and a half more weeks, then discontinue Xarelto. Once the patient has completed the Xarelto, they may resume the 81 mg Aspirin.  Postoperative Constipation Protocol  Constipation - defined medically as fewer than three stools per week and severe constipation as less than one stool per week.  One of the most common issues patients have following surgery is constipation.  Even if you have a regular bowel pattern at home, your normal regimen is likely to be disrupted due to multiple reasons following surgery.  Combination of anesthesia, postoperative narcotics, change in appetite and fluid intake all can affect your bowels.  In order to avoid complications following surgery, here are some recommendations in order to help you during your recovery period.  Colace (docusate) - Pick up an over-the-counter form of Colace or another stool softener and take twice a day as long as you are requiring postoperative pain medications.  Take with a full glass of water daily.  If you experience loose stools or diarrhea, hold the colace until you stool forms back up.  If your symptoms do not get  better within 1 week or if they get worse, check with your doctor.  Dulcolax (bisacodyl) - Pick up over-the-counter and take as directed by the product packaging as needed to assist with the movement of your bowels.  Take with a full glass of water.  Use this product as needed if not relieved by Colace only.   MiraLax (polyethylene glycol) - Pick up over-the-counter to have on hand.  MiraLax is a solution that will increase the amount of water in your bowels to assist with bowel movements.  Take as directed and can mix with a glass of water, juice, soda, coffee, or tea.  Take if you go more than two days without a movement. Do not use MiraLax more than once per day. Call your doctor if you are still constipated or  irregular after using this medication for 7 days in a row.  If you continue to have problems with postoperative constipation, please contact the office for further assistance and recommendations.  If you experience "the worst abdominal pain ever" or develop nausea or vomiting, please contact the office immediatly for further recommendations for treatment.     Do not put a pillow under the knee. Place it under the heel.    Complete by:  As directed      Do not sit on low chairs, stoools or toilet seats, as it may be difficult to get up from low surfaces    Complete by:  As directed      Driving restrictions    Complete by:  As directed   No driving until released by the physician.     Increase activity slowly as tolerated    Complete by:  As directed      Lifting restrictions    Complete by:  As directed   No lifting until released by the physician.     Patient may shower    Complete by:  As directed   You may shower without a dressing once there is no drainage.  Do not wash over the wound.  If drainage remains, do not shower until drainage stops.     TED hose    Complete by:  As directed   Use stockings (TED hose) for 3 weeks on both leg(s).  You may remove them at night for  sleeping.     Weight bearing as tolerated    Complete by:  As directed   Laterality:  right  Extremity:  Lower            Medication List    STOP taking these medications        aspirin 81 MG tablet     CHOLEST OFF COMPLETE PO     DHEA 25 MG Caps     letrozole 2.5 MG tablet  Commonly known as:  FEMARA     multivitamin with minerals Tabs tablet     NANDROLONE DECANOATE IM     naproxen sodium 220 MG tablet  Commonly known as:  ANAPROX     NON FORMULARY     OMNITROPE [place]     progesterone 100 MG capsule  Commonly known as:  PROMETRIUM     SEA-OMEGA 30 1200 MG Caps     sildenafil 100 MG tablet  Commonly known as:  VIAGRA     testosterone cypionate 100 MG/ML injection  Commonly known as:  DEPOTESTOTERONE CYPIONATE     vitamin C 500 MG tablet  Commonly known as:  ASCORBIC ACID     Vitamin D3 5000 UNITS Tabs     vitamin E 400 UNIT capsule     VITAMIN K PO      TAKE these medications        acetaminophen 650 MG CR tablet  Commonly known as:  TYLENOL  Take 1,300 mg by mouth at bedtime.     acidophilus Caps capsule  Take 1 capsule by mouth 2 (two) times daily.     acyclovir 200 MG capsule  Commonly known as:  ZOVIRAX  Take 200-400 mg by mouth 2 (two) times daily.     ezetimibe 10 MG tablet  Commonly known as:  ZETIA  Take 10 mg by mouth every evening.     fluticasone 50 MCG/ACT nasal spray  Commonly known as:  FLONASE  Place 2 sprays into the  nose daily.     HYDROmorphone 2 MG tablet  Commonly known as:  DILAUDID  Take 1-2 tablets (2-4 mg total) by mouth every 4 (four) hours as needed for moderate pain or severe pain.     methocarbamol 500 MG tablet  Commonly known as:  ROBAXIN  Take 1 tablet (500 mg total) by mouth every 6 (six) hours as needed for muscle spasms.     nisoldipine 17 MG 24 hr tablet  Commonly known as:  SULAR  Take 17 mg by mouth every morning.     omeprazole 20 MG capsule  Commonly known as:  PRILOSEC  Take 20 mg by mouth  2 (two) times daily.     potassium gluconate 595 MG Tabs tablet  Take 595 mg by mouth every evening.     rivaroxaban 10 MG Tabs tablet  Commonly known as:  XARELTO  - Take 1 tablet (10 mg total) by mouth daily with breakfast. Take Xarelto for two and a half more weeks, then discontinue Xarelto.  - Once the patient has completed the Xarelto, they may resume the 81 mg Aspirin.     telmisartan 80 MG tablet  Commonly known as:  MICARDIS  Take 80 mg by mouth every morning.     thyroid 60 MG tablet  Commonly known as:  ARMOUR  Take 60-120 mg by mouth 2 (two) times daily.     traMADol 50 MG tablet  Commonly known as:  ULTRAM  Take 1-2 tablets (50-100 mg total) by mouth every 6 (six) hours as needed (mild pain).       Follow-up Information    Follow up with [facility].   Why:  home health physical and occupational therapy   Contact information:   [address] [place] [address] [phone]       Follow up with [name], MD. Schedule an appointment as soon as possible for a visit on 08/18/2014.   Specialty:  Orthopedic Surgery   Why:  Call office at [phone] to setup appointment on Tuesday 08/18/2014 with Dr. Wynelle Link.   Contact information:   [address] [place] [address] [phone]       Signed: Arlee Muslim, PA-C Orthopaedic Surgery 08/27/2014, 4:59 AM

## 2014-08-05 NOTE — Discharge Instructions (Signed)
° °Dr. Frank [name] °Total Joint Specialist °[facility] °[address] °[place], [place] [address] °[phone] ° °TOTAL KNEE REPLACEMENT POSTOPERATIVE DIRECTIONS ° ° ° °Knee Rehabilitation, Guidelines Following Surgery  °Results after knee surgery are often greatly improved when you follow the exercise, range of motion and muscle strengthening exercises prescribed by your doctor. Safety measures are also important to protect the knee from further injury. Any time any of these exercises cause you to have increased pain or swelling in your knee joint, decrease the amount until you are comfortable again and slowly increase them. If you have problems or questions, call your caregiver or physical therapist for advice.  ° °HOME CARE INSTRUCTIONS  °Remove items at home which could result in a fall. This includes throw rugs or furniture in walking pathways.  °Continue medications as instructed at time of discharge. °You may have some home medications which will be placed on hold until you complete the course of blood thinner medication.  °You may start showering once you are discharged home but do not submerge the incision under water. Just pat the incision dry and apply a dry gauze dressing on daily. °Walk with walker as instructed.  °You may resume a sexual relationship in one month or when given the OK by  your doctor.  °· Use walker as long as suggested by your caregivers. °· Avoid periods of inactivity such as sitting longer than an hour when not asleep. This helps prevent blood clots.  °You may put full weight on your legs and walk as much as is comfortable.  °You may return to work once you are cleared by your doctor.  °Do not drive a car for 6 weeks or until released by you surgeon.  °· Do not drive while taking narcotics.  °Wear the elastic stockings for three weeks following surgery during the day but you may remove then at night. °Make sure you keep all of your appointments after your  operation with all of your doctors and caregivers. You should call the office at the above phone number and make an appointment for approximately two weeks after the date of your surgery. °Change the dressing daily and reapply a dry dressing each time. °Please pick up a stool softener and laxative for home use as long as you are requiring pain medications. °· ICE to the affected knee every three hours for 30 minutes at a time and then as needed for pain and swelling.  Continue to use ice on the knee for pain and swelling from surgery. You may notice swelling that will progress down to the foot and ankle.  This is normal after surgery.  Elevate the leg when you are not up walking on it.   °It is important for you to complete the blood thinner medication as prescribed by your doctor. °· Continue to use the breathing machine which will help keep your temperature down.  It is common for your temperature to cycle up and down following surgery, especially at night when you are not up moving around and exerting yourself.  The breathing machine keeps your lungs expanded and your temperature down. ° °RANGE OF MOTION AND STRENGTHENING EXERCISES  °Rehabilitation of the knee is important following a knee injury or an operation. After just a few days of immobilization, the muscles of the thigh which control the knee become weakened and shrink (atrophy). Knee exercises are designed to build up the tone and strength of the thigh muscles and to improve knee   motion. Often times heat used for twenty to thirty minutes before working out will loosen up your tissues and help with improving the range of motion but do not use heat for the first two weeks following surgery. These exercises can be done on a training (exercise) mat, on the floor, on a table or on a bed. Use what ever works the best and is most comfortable for you Knee exercises include:  °Leg Lifts - While your knee is still immobilized in a splint or cast, you can do  straight leg raises. Lift the leg to 60 degrees, hold for 3 sec, and slowly lower the leg. Repeat 10-20 times 2-3 times daily. Perform this exercise against resistance later as your knee gets better.  °Quad and Hamstring Sets - Tighten up the muscle on the front of the thigh (Quad) and hold for 5-10 sec. Repeat this 10-20 times hourly. Hamstring sets are done by pushing the foot backward against an object and holding for 5-10 sec. Repeat as with quad sets.  °A rehabilitation program following serious knee injuries can speed recovery and prevent re-injury in the future due to weakened muscles. Contact your doctor or a physical therapist for more information on knee rehabilitation.  ° °SKILLED REHAB INSTRUCTIONS: °If the patient is transferred to a skilled rehab facility following release from the hospital, a list of the current medications will be sent to the facility for the patient to continue.  When discharged from the skilled rehab facility, please have the facility set up the patient's Home Health Physical Therapy prior to being released. Also, the skilled facility will be responsible for providing the patient with their medications at time of release from the facility to include their pain medication, the muscle relaxants, and their blood thinner medication. If the patient is still at the rehab facility at time of the two week follow up appointment, the skilled rehab facility will also need to assist the patient in arranging follow up appointment in our office and any transportation needs. ° °MAKE SURE YOU:  °Understand these instructions.  °Will watch your condition.  °Will get help right away if you are not doing well or get worse.  ° ° °Pick up stool softner and laxative for home use following surgery while on pain medications. °Do not submerge incision under water. °Please use good hand washing techniques while changing dressing each day. °May shower starting three days after surgery. °Please use a clean  towel to pat the incision dry following showers. °Continue to use ice for pain and swelling after surgery. °Do not use any lotions or creams on the incision until instructed by your surgeon. ° °Take Xarelto for two and a half more weeks, then discontinue Xarelto. °Once the patient has completed the Xarelto, they may resume the 81 mg Aspirin. ° °Postoperative Constipation Protocol ° °Constipation - defined medically as fewer than three stools per week and severe constipation as less than one stool per week. ° °One of the most common issues patients have following surgery is constipation.  Even if you have a regular bowel pattern at home, your normal regimen is likely to be disrupted due to multiple reasons following surgery.  Combination of anesthesia, postoperative narcotics, change in appetite and fluid intake all can affect your bowels.  In order to avoid complications following surgery, here are some recommendations in order to help you during your recovery period. ° °Colace (docusate) - Pick up an over-the-counter form of Colace or another stool softener and take   twice a day as long as you are requiring postoperative pain medications.  Take with a full glass of water daily.  If you experience loose stools or diarrhea, hold the colace until you stool forms back up.  If your symptoms do not get better within 1 week or if they get worse, check with your doctor.  Dulcolax (bisacodyl) - Pick up over-the-counter and take as directed by the product packaging as needed to assist with the movement of your bowels.  Take with a full glass of water.  Use this product as needed if not relieved by Colace only.   MiraLax (polyethylene glycol) - Pick up over-the-counter to have on hand.  MiraLax is a solution that will increase the amount of water in your bowels to assist with bowel movements.  Take as directed and can mix with a glass of water, juice, soda, coffee, or tea.  Take if you go more than two days without a  movement. Do not use MiraLax more than once per day. Call your doctor if you are still constipated or irregular after using this medication for 7 days in a row.  If you continue to have problems with postoperative constipation, please contact the office for further assistance and recommendations.  If you experience "the worst abdominal pain ever" or develop nausea or vomiting, please contact the office immediatly for further recommendations for treatment.    Information on my medicine - XARELTO (Rivaroxaban)  This medication education was reviewed with me or my healthcare representative as part of my discharge preparation.  The pharmacist that spoke with me during my hospital stay was:  Lolita Patella, [facility]  Why was Xarelto prescribed for you? Xarelto was prescribed for you to reduce the risk of blood clots forming after orthopedic surgery. The medical term for these abnormal blood clots is venous thromboembolism (VTE).  What do you need to know about xarelto ? Take your Xarelto ONCE DAILY at the same time every day. You may take it either with or without food.  If you have difficulty swallowing the tablet whole, you may crush it and mix in applesauce just prior to taking your dose.  Take Xarelto exactly as prescribed by your doctor and DO NOT stop taking Xarelto without talking to the doctor who prescribed the medication.  Stopping without other VTE prevention medication to take the place of Xarelto may increase your risk of developing a clot.  After discharge, you should have regular check-up appointments with your healthcare provider that is prescribing your Xarelto.    What do you do if you miss a dose? If you miss a dose, take it as soon as you remember on the same day then continue your regularly scheduled once daily regimen the next day. Do not take two doses of Xarelto on the same day.   Important Safety Information A possible side effect of Xarelto is  bleeding. You should call your healthcare provider right away if you experience any of the following: ? Bleeding from an injury or your nose that does not stop. ? Unusual colored urine (red or dark brown) or unusual colored stools (red or black). ? Unusual bruising for unknown reasons. ? A serious fall or if you hit your head (even if there is no bleeding).  Some medicines may interact with Xarelto and might increase your risk of bleeding while on Xarelto. To help avoid this, consult your healthcare provider or pharmacist prior to using any new prescription or non-prescription medications, including herbals,  vitamins, non-steroidal anti-inflammatory drugs (NSAIDs) and supplements.  This website has more information on Xarelto: [email or web address]

## 2014-08-06 DIAGNOSIS — Z471 Aftercare following joint replacement surgery: Secondary | ICD-10-CM | POA: Diagnosis not present

## 2014-08-06 DIAGNOSIS — Z96651 Presence of right artificial knee joint: Secondary | ICD-10-CM | POA: Diagnosis not present

## 2014-08-06 DIAGNOSIS — I10 Essential (primary) hypertension: Secondary | ICD-10-CM | POA: Diagnosis not present

## 2014-08-06 DIAGNOSIS — I2510 Atherosclerotic heart disease of native coronary artery without angina pectoris: Secondary | ICD-10-CM | POA: Diagnosis not present

## 2014-08-06 DIAGNOSIS — Z7901 Long term (current) use of anticoagulants: Secondary | ICD-10-CM | POA: Diagnosis not present

## 2014-08-07 DIAGNOSIS — Z96651 Presence of right artificial knee joint: Secondary | ICD-10-CM | POA: Diagnosis not present

## 2014-08-07 DIAGNOSIS — Z7901 Long term (current) use of anticoagulants: Secondary | ICD-10-CM | POA: Diagnosis not present

## 2014-08-07 DIAGNOSIS — I10 Essential (primary) hypertension: Secondary | ICD-10-CM | POA: Diagnosis not present

## 2014-08-07 DIAGNOSIS — I2510 Atherosclerotic heart disease of native coronary artery without angina pectoris: Secondary | ICD-10-CM | POA: Diagnosis not present

## 2014-08-07 DIAGNOSIS — Z471 Aftercare following joint replacement surgery: Secondary | ICD-10-CM | POA: Diagnosis not present

## 2014-08-08 DIAGNOSIS — Z96651 Presence of right artificial knee joint: Secondary | ICD-10-CM | POA: Diagnosis not present

## 2014-08-08 DIAGNOSIS — Z7901 Long term (current) use of anticoagulants: Secondary | ICD-10-CM | POA: Diagnosis not present

## 2014-08-08 DIAGNOSIS — I2510 Atherosclerotic heart disease of native coronary artery without angina pectoris: Secondary | ICD-10-CM | POA: Diagnosis not present

## 2014-08-08 DIAGNOSIS — Z471 Aftercare following joint replacement surgery: Secondary | ICD-10-CM | POA: Diagnosis not present

## 2014-08-08 DIAGNOSIS — I10 Essential (primary) hypertension: Secondary | ICD-10-CM | POA: Diagnosis not present

## 2014-08-10 DIAGNOSIS — Z96651 Presence of right artificial knee joint: Secondary | ICD-10-CM | POA: Diagnosis not present

## 2014-08-10 DIAGNOSIS — Z471 Aftercare following joint replacement surgery: Secondary | ICD-10-CM | POA: Diagnosis not present

## 2014-08-10 DIAGNOSIS — I10 Essential (primary) hypertension: Secondary | ICD-10-CM | POA: Diagnosis not present

## 2014-08-10 DIAGNOSIS — Z7901 Long term (current) use of anticoagulants: Secondary | ICD-10-CM | POA: Diagnosis not present

## 2014-08-10 DIAGNOSIS — I2510 Atherosclerotic heart disease of native coronary artery without angina pectoris: Secondary | ICD-10-CM | POA: Diagnosis not present

## 2014-08-12 DIAGNOSIS — I10 Essential (primary) hypertension: Secondary | ICD-10-CM | POA: Diagnosis not present

## 2014-08-12 DIAGNOSIS — Z96651 Presence of right artificial knee joint: Secondary | ICD-10-CM | POA: Diagnosis not present

## 2014-08-12 DIAGNOSIS — I2510 Atherosclerotic heart disease of native coronary artery without angina pectoris: Secondary | ICD-10-CM | POA: Diagnosis not present

## 2014-08-12 DIAGNOSIS — Z471 Aftercare following joint replacement surgery: Secondary | ICD-10-CM | POA: Diagnosis not present

## 2014-08-12 DIAGNOSIS — Z7901 Long term (current) use of anticoagulants: Secondary | ICD-10-CM | POA: Diagnosis not present

## 2014-08-13 ENCOUNTER — Ambulatory Visit: Payer: Self-pay | Admitting: Physical Therapy

## 2014-08-18 DIAGNOSIS — Z96651 Presence of right artificial knee joint: Secondary | ICD-10-CM | POA: Diagnosis not present

## 2014-08-18 DIAGNOSIS — Z471 Aftercare following joint replacement surgery: Secondary | ICD-10-CM | POA: Diagnosis not present

## 2014-08-19 ENCOUNTER — Ambulatory Visit (INDEPENDENT_AMBULATORY_CARE_PROVIDER_SITE_OTHER): Payer: Medicare Other | Admitting: Physical Therapy

## 2014-08-19 DIAGNOSIS — R262 Difficulty in walking, not elsewhere classified: Secondary | ICD-10-CM

## 2014-08-19 DIAGNOSIS — M24661 Ankylosis, right knee: Secondary | ICD-10-CM | POA: Diagnosis not present

## 2014-08-19 DIAGNOSIS — R29898 Other symptoms and signs involving the musculoskeletal system: Secondary | ICD-10-CM

## 2014-08-19 DIAGNOSIS — R269 Unspecified abnormalities of gait and mobility: Secondary | ICD-10-CM

## 2014-08-19 NOTE — Therapy (Signed)
[name], [place], [address] Phone: [phone]   Fax:  [phone]  Physical Therapy Evaluation  Patient Details  Name: [name] Glass MRN: [id number] Date of Birth: 02/23/1941 Referring Provider:  [name], MD  Encounter Date: 08/19/2014      PT End of Session - 08/19/14 1303    Visit Number 1   Number of Visits 16   Date for PT Re-Evaluation 10/18/14   PT Start Time 1022   PT Stop Time 1100   PT Time Calculation (min) 38 min   Activity Tolerance Patient tolerated treatment well   Behavior During Therapy [facility] for tasks assessed/performed      Past Medical History  Diagnosis Date  . GERD (gastroesophageal reflux disease)   . Coronary artery disease   . Hypothyroidism   . Sleep apnea     hx of, surgery to reverse  . H/O hiatal hernia   . Arthritis   . Hyperlipidemia     statin intolerant, under control  . Hypertension     under control  . H/O bronchitis   . History of kidney stones last in 1976    Past Surgical History  Procedure Laterality Date  . Uvulopalatopharyngoplasty  6 years ago  . Cystoscopy N/A 08/06/2012    Procedure: CYSTOSCOPY;  Surgeon: [name], MD;  Location: [facility];  Service: Urology;  Laterality: N/A;  . Transurethral resection of prostate N/A 08/06/2012    Procedure: FULGERATION OF PROSTATE WITH GYRUS ;  Surgeon: [name], MD;  Location: [facility];  Service: Urology;  Laterality: N/A;  . Cardiac catheterization Right 2006    5 heart stents  . Esophagogastric fundoplication  8242    spleen removed, 5 pints of blood given  . Tonsillectomy  as child  . Hernia repair  1985    x4  . Prostate ablation  2013    "laser ablation"  . Cataract extraction Bilateral 6 years ago  . 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  Visit Diagnosis:  Decreased range of motion of knee, right - Plan: PT plan of care cert/re-cert  Weakness of right lower extremity - Plan: PT plan of care cert/re-cert  Difficulty walking - Plan: PT plan of care cert/re-cert  Abnormality of gait - Plan: PT plan of care cert/re-cert      Subjective Assessment - 08/19/14 1025    Symptoms Pt is a 74 y/o male who presents to OPPT s/p R TKA on 08/03/14.     Limitations Walking;Standing;Sitting;House hold activities   How long can you sit comfortably? 15-20 min with foot in dependent position   How long can you stand comfortably? 5-10 min   How long can you walk comfortably? currently only taking short walks   Patient Stated Goals improve ROM R knee; walk without device, negotiate stairs   Currently in Pain? Yes   Pain Score 3    Pain Location Knee   Pain Orientation Right   Pain Descriptors / Indicators Dull   Pain Type Surgical pain   Pain Onset 1 to 4 weeks ago   Pain Frequency Constant   Aggravating Factors  sitting with feet in dependent position, standing, walking   Pain Relieving Factors sitting with RLE elevated, pain medication  [facility] PT Assessment - 08/19/14 1030    Assessment   Medical Diagnosis R TKA   Onset Date 08/03/14   Next MD Visit 3 weeks   Prior Therapy [facility]   Precautions   Precautions None   Restrictions   Weight Bearing Restrictions Yes   RLE Weight Bearing Weight bearing as tolerated   Balance Screen   Has the patient fallen in the past 6 months Yes   How many times? 1: Jan 2016; unknown cause of fall with broken nose, finger and rib   Has the patient had a decrease in activity level because of a fear of falling?  Yes   Is the patient reluctant to leave their home because of a fear of falling?  No   Home Environment   Living Enviornment Private residence   Living Arrangements Spouse/significant other   Type of [place] to enter   Entrance Stairs-Number of Steps 2   Entrance Stairs-Rails None   Home Layout Multi-level;Able to live on main level with bedroom/bathroom   Prior Function   Level of Independence Independent with basic ADLs;Independent with gait;Independent with transfers   Vocation Full time employment   Statistician; mostly sitting during day   Leisure golf, boating, jet skiing, attending [place] basketball/football games   Observation/Other Assessments   Focus on Therapeutic Outcomes (FOTO)  32 (68% limited; predicted 43% limited)   AROM   AROM Assessment Site Knee   Right/Left Knee Right;Left   Right Knee Extension 20   Right Knee Flexion 78   Left Knee Extension -1   Left Knee Flexion 133   PROM   PROM Assessment Site Knee   Right/Left Knee Right  20-86   Strength   Strength Assessment Site Hip;Knee   Right/Left Hip Right;Left   Right Hip Flexion 3/5   Right Hip Extension 3+/5   Right Hip ABduction 3/5   Right Hip ADduction 4-/5   Left Hip Flexion 4/5   Left Hip Extension 4/5   Left Hip ABduction 4/5   Right/Left Knee Right;Left   Right Knee Flexion 3-/5   Right Knee Extension 3-/5   Left Knee Flexion 5/5   Left Knee Extension 5/5   Ambulation/Gait   Ambulation/Gait Yes   Ambulation/Gait Assistance 6: Modified independent (Device/Increase time)   Ambulation Distance (Feet) 100 Feet   Assistive device Rolling walker   Gait Pattern Step-to pattern;Decreased stance time - right;Decreased step length - left;Decreased weight shift to right;Trunk flexed  decreased heel strike R; R knee flexed   Ambulation Surface Level;Indoor   Gait velocity decreased                   [facility] Adult PT Treatment/Exercise - 08/19/14 1030    Knee/Hip Exercises: Aerobic   Stationary Bike NuStep L3 x 5 min; 4 extremities for ROM   Knee/Hip Exercises: Seated   Other Seated Knee Exercises seated heel slides followed by scooting out  to edge of chair 5sec x 10                PT Education - 08/19/14 1302    Education provided Yes   Education Details continue current HEP; added additional flexion exercise   Person(s) Educated Patient;Other (comment)  girlfriend's daughter   Methods Explanation;Handout   Comprehension Verbalized understanding;Returned demonstration;Need further instruction          PT Short Term Goals - 08/19/14 1307  PT SHORT TERM GOAL #1   Title independent with HEP (09/16/14)   Time 4   Period Weeks   Status New   PT SHORT TERM GOAL #2   Title improve R knee AROM 10-90 degrees for improved function (09/16/14)   Time 4   Period Weeks   Status New   PT SHORT TERM GOAL #3   Title ambulate > 56' with single point cane modified independent for improved function (09/16/14)   Time 4   Period Weeks   Status New   PT SHORT TERM GOAL #4   Title report ability to ambulate > 20 min with LRAD without increase in pain for improved funciton (09/16/14)   Time 4   Period Weeks   Status New           PT Long Term Goals - 08/19/14 1316    PT LONG TERM GOAL #1   Title independent with advanced HEP for home and community (10/14/14)   Time 8   Period Weeks   Status New   PT LONG TERM GOAL #2   Title improve R knee AROM 0-100 degrees for improved function and mobility (10/14/14)   Time 8   Period Weeks   Status New   PT LONG TERM GOAL #3   Title ambulate > 100' without a device for improved function and mobility (10/14/14)   Time 8   Period Weeks   Status New   PT LONG TERM GOAL #4   Title perform RLE SLS > 5 sec for improved strength and function (10/14/14)   Time 8   Period Weeks   Status New               Plan - 08/19/14 1304    Clinical Impression Statement Pt presents to OPPT with functional limitations due to swelling, decreased ROM and decreased strength following R TKA.  Will benefit from PT to maximize function and return to work and recretional activities.     Pt  will benefit from skilled therapeutic intervention in order to improve on the following deficits Abnormal gait;Decreased range of motion;Difficulty walking;Impaired flexibility;Pain;Increased edema;Decreased activity tolerance;Decreased scar mobility;Decreased balance;Decreased knowledge of use of DME;Decreased mobility;Decreased strength   Rehab Potential Good   PT Frequency 3x / week   PT Duration 8 weeks  3x/wk x 4 wks then may decrease to 2x/wk   PT Treatment/Interventions ADLs/Self Care Home Management;Therapeutic activities;Patient/family education;Moist Heat;Scar mobilization;Passive range of motion;DME Instruction;Therapeutic exercise;Gait training;Balance training;Manual techniques;Neuromuscular re-education;Stair training;Cryotherapy;Electrical Stimulation;Functional mobility training   PT Next Visit Plan aggressive ROM, add to HEP; strengthening to begin weaning from RW   Consulted and Agree with Plan of Care Patient         Problem List Patient Active Problem List   Diagnosis Date Noted  . OA (osteoarthritis) of knee 08/03/2014  . Coronary artery disease 07/10/2014  . Essential hypertension 07/10/2014  . Hyperlipidemia 07/10/2014   Laureen Abrahams, PT, DPT 08/19/2014 1:20 PM  [facility] [place], [place], [address] Phone: [phone]   Fax:  [phone]

## 2014-08-19 NOTE — Patient Instructions (Signed)
   Sit in a chair and bend right knee as far back as possible.  Keeping foot in same position scoot hips forward. Hold for 10-15 seconds then scoot out a little more.  Perform 5-10 reps.  Do 3-5 sessions a day.  Laureen Abrahams, PT, DPT 08/19/2014 10:56 AM  [facility] [place], [place] [address]  [phone] (office) [phone] (fax)

## 2014-08-21 ENCOUNTER — Ambulatory Visit (INDEPENDENT_AMBULATORY_CARE_PROVIDER_SITE_OTHER): Payer: Medicare Other | Admitting: Physical Therapy

## 2014-08-21 DIAGNOSIS — M24661 Ankylosis, right knee: Secondary | ICD-10-CM | POA: Diagnosis present

## 2014-08-21 DIAGNOSIS — R262 Difficulty in walking, not elsewhere classified: Secondary | ICD-10-CM

## 2014-08-21 DIAGNOSIS — R269 Unspecified abnormalities of gait and mobility: Secondary | ICD-10-CM | POA: Diagnosis not present

## 2014-08-21 DIAGNOSIS — R29898 Other symptoms and signs involving the musculoskeletal system: Secondary | ICD-10-CM | POA: Diagnosis not present

## 2014-08-21 NOTE — Therapy (Signed)
[place], [place], [address] Phone: [phone]   Fax:  [phone]  Physical Therapy Treatment  Patient Details  Name: [name] Glass MRN: [id number] Date of Birth: [birth year] Referring Provider:  [name], MD  Encounter Date: 08/21/2014      PT End of Session - 08/21/14 1450    Visit Number 2   Number of Visits 16   Date for PT Re-Evaluation 10/18/14   PT Start Time 1937   PT Stop Time 1543   PT Time Calculation (min) 55 min   Activity Tolerance Patient limited by pain      Past Medical History  Diagnosis Date  . GERD (gastroesophageal reflux disease)   . Coronary artery disease   . Hypothyroidism   . Sleep apnea     hx of, surgery to reverse  . H/O hiatal hernia   . Arthritis   . Hyperlipidemia     statin intolerant, under control  . Hypertension     under control  . H/O bronchitis   . History of kidney stones last in 1976    Past Surgical History  Procedure Laterality Date  . Uvulopalatopharyngoplasty  6 years ago  . Cystoscopy N/A 08/06/2012    Procedure: CYSTOSCOPY;  Surgeon: [name], MD;  Location: [facility];  Service: Urology;  Laterality: N/A;  . Transurethral resection of prostate N/A 08/06/2012    Procedure: FULGERATION OF PROSTATE WITH GYRUS ;  Surgeon: [name], MD;  Location: [facility];  Service: Urology;  Laterality: N/A;  . Cardiac catheterization Right 2006    5 heart stents  . Esophagogastric fundoplication  9024    spleen removed, 5 pints of blood given  . Tonsillectomy  as child  . Hernia repair  1985    x4  . Prostate ablation  2013    "laser ablation"  . Cataract extraction Bilateral 6 years ago  . 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  Visit Diagnosis:   Decreased range of motion of knee, right  Weakness of right lower extremity  Difficulty walking  Abnormality of gait      Subjective Assessment - 08/21/14 1451    Symptoms "I'm getting better a little bit better each day"    Currently in Pain? Yes   Pain Score 3    Pain Location Knee   Pain Orientation Right   Pain Descriptors / Indicators Dull;Aching            [facility] PT Assessment - 08/21/14 0001    Assessment   Medical Diagnosis R TKA   Onset Date 08/03/14   Next MD Visit 3 weeks   Prior Therapy HHPT                   [facility] Adult PT Treatment/Exercise - 08/21/14 0001    Exercises   Exercises Knee/Hip   Knee/Hip Exercises: Aerobic   Stationary Bike NuStep level 4 x 5 min    Knee/Hip Exercises: Standing   Heel Raises 1 set;15 reps  with UE support on RW   Terminal Knee Extension Right;Strengthening;1 set;10 reps;Theraband   Theraband Level (Terminal Knee Extension) Level 2 (Red)   Lateral Step Up 1 set;10 reps;Right;Hand Hold: 2  3" - challenging    Forward  Step Up Right;1 set;10 reps;Hand Hold: 2  3" step challenging.    Other Standing Knee Exercises Mini squats with UE on RW x 10    Other Standing Knee Exercises Standing weight shifts with Rt foot on scale: pt able to bear up to 80% body wt into RLE x 15 reps. VC for upright posture and engaging quad.    Knee/Hip Exercises: Seated   Other Seated Knee Exercises Seated scoots wiht 5 sec hold x 10 reps to increase Rt knee flex. (able to reach 81 deg), Hip abd/add with Rt foot on rolling stool x 25.    Other Seated Knee Exercises Sit to/from stand with focus on even weight between legs x 10.     Knee/Hip Exercises: Supine   Quad Sets Right;5 reps  with foot on rolling stool.    Modalities   Modalities Cryotherapy   Cryotherapy   Number Minutes Cryotherapy 15 Minutes   Cryotherapy Location Knee  Rt   Type of Cryotherapy --  vaso, med pressure, 3 snowflakes.                   PT Short  Term Goals - 08/19/14 1307    PT SHORT TERM GOAL #1   Title independent with HEP (09/16/14)   Time 4   Period Weeks   Status New   PT SHORT TERM GOAL #2   Title improve R knee AROM 10-90 degrees for improved function (09/16/14)   Time 4   Period Weeks   Status New   PT SHORT TERM GOAL #3   Title ambulate > 55' with single point cane modified independent for improved function (09/16/14)   Time 4   Period Weeks   Status New   PT SHORT TERM GOAL #4   Title report ability to ambulate > 20 min with LRAD without increase in pain for improved funciton (09/16/14)   Time 4   Period Weeks   Status New           PT Long Term Goals - 08/19/14 1316    PT LONG TERM GOAL #1   Title independent with advanced HEP for home and community (10/14/14)   Time 8   Period Weeks   Status New   PT LONG TERM GOAL #2   Title improve R knee AROM 0-100 degrees for improved function and mobility (10/14/14)   Time 8   Period Weeks   Status New   PT LONG TERM GOAL #3   Title ambulate > 100' without a device for improved function and mobility (10/14/14)   Time 8   Period Weeks   Status New   PT LONG TERM GOAL #4   Title perform RLE SLS > 5 sec for improved strength and function (10/14/14)   Time 8   Period Weeks   Status New               Plan - 08/21/14 1534    Clinical Impression Statement Pt tolerated all new exercises with minimal increase in symptoms. Pt was instructed on self massage to scar and to assist with edema.  Pt had difficulty tolerating Vaso for 15 min due to increased Rt hip pain in hooklying.    Pt will benefit from skilled therapeutic intervention in order to improve on the following deficits Abnormal gait;Decreased range of motion;Difficulty walking;Impaired flexibility;Pain;Increased edema;Decreased activity tolerance;Decreased scar mobility;Decreased balance;Decreased knowledge of use of DME;Decreased mobility;Decreased strength   Rehab Potential Good   PT Frequency 3x /  week    PT Duration 8 weeks   PT Treatment/Interventions ADLs/Self Care Home Management;Therapeutic activities;Patient/family education;Moist Heat;Scar mobilization;Passive range of motion;DME Instruction;Therapeutic exercise;Gait training;Balance training;Manual techniques;Neuromuscular re-education;Stair training;Cryotherapy;Electrical Stimulation;Functional mobility training   PT Next Visit Plan aggressive ROM, add to HEP; strengthening to begin weaning from RW.    Consulted and Agree with Plan of Care Patient        Problem List Patient Active Problem List   Diagnosis Date Noted  . OA (osteoarthritis) of knee 08/03/2014  . Coronary artery disease 07/10/2014  . Essential hypertension 07/10/2014  . Hyperlipidemia 07/10/2014   Kerin Perna, PTA 08/21/2014 3:47 PM   [place], [place], [address] Phone: [phone]   Fax:  [phone]

## 2014-08-24 ENCOUNTER — Ambulatory Visit (INDEPENDENT_AMBULATORY_CARE_PROVIDER_SITE_OTHER): Payer: Medicare Other | Admitting: Physical Therapy

## 2014-08-24 DIAGNOSIS — R29898 Other symptoms and signs involving the musculoskeletal system: Secondary | ICD-10-CM

## 2014-08-24 DIAGNOSIS — R262 Difficulty in walking, not elsewhere classified: Secondary | ICD-10-CM

## 2014-08-24 DIAGNOSIS — R269 Unspecified abnormalities of gait and mobility: Secondary | ICD-10-CM

## 2014-08-24 DIAGNOSIS — M24661 Ankylosis, right knee: Secondary | ICD-10-CM | POA: Diagnosis present

## 2014-08-24 NOTE — Patient Instructions (Signed)
  Copyright  VHI. All rights reserved.  Strengthening: Straight Leg Raise (Phase 1)   Tighten muscles on front of right thigh, then lift leg _6-8___ inches from surface, keeping knee locked.  Repeat _10___ times per set. Do _2___ sets per session. Do __1__ sessions per day.  http://orth.exer.us/614   Copyright  VHI. All rights reserved.  Strengthening: Hip Abduction (Side-Lying)   Tighten muscles on front of left thigh, then lift leg _6-8___ inches from surface, keeping knee locked.  Repeat __10__ times per set. Do __2_ sets per session. Do _1___ sessions per day.  http://orth.exer.us/622   Copyright  VHI. All rights reserved.  Functional Quadriceps: Chair Squat   Keeping feet flat on floor, shoulder width apart, squat as low as is comfortable. Use support as necessary. Repeat __10__ times per set. Do __1-2__ sets per session. Do __1__ sessions per day.  http://orth.exer.us/736   Copyright  VHI. All rights reserved.  Quad Strength: Single-Leg Step-Up   With foot of involved leg on step, straighten leg. Return. Use ___6_ inch step. Repeat __10-20__ times. Do _1___ sessions per day.  http://cc.exer.us/3   Copyright  VHI. All rights reserved.  Proprioception, Quad Strength, Coordination: Side Step-Up  http://cc.exer.us/6   Copyright  VHI. All rights reserved.  Proprioception, Quad Strength, Timing, Coordination: Forward Step-Up   Move onto step with one foot, then the other. Step back off the same way. Use __6__ inch step. Repeat _10-20___ times. Do __1__ sessions per day.  http://cc.exer.us/4   Copyright  VHI. All rights reserved.

## 2014-08-24 NOTE — Therapy (Signed)
[place], [place], [address] Phone: [phone]   Fax:  [phone]  Physical Therapy Treatment  Patient Details  Name: [name] Glass MRN: [id number] Date of Birth: 1940-11-11 Referring Provider:  [name], MD  Encounter Date: 08/24/2014      PT End of Session - 08/24/14 1414    Visit Number 3   Number of Visits 16   Date for PT Re-Evaluation 10/18/14   PT Start Time 0626   PT Stop Time 1503   PT Time Calculation (min) 56 min   Activity Tolerance Patient tolerated treatment well      Past Medical History  Diagnosis Date  . GERD (gastroesophageal reflux disease)   . Coronary artery disease   . Hypothyroidism   . Sleep apnea     hx of, surgery to reverse  . H/O hiatal hernia   . Arthritis   . Hyperlipidemia     statin intolerant, under control  . Hypertension     under control  . H/O bronchitis   . History of kidney stones last in 1976    Past Surgical History  Procedure Laterality Date  . Uvulopalatopharyngoplasty  6 years ago  . Cystoscopy N/A 08/06/2012    Procedure: CYSTOSCOPY;  Surgeon: [name], MD;  Location: [facility];  Service: Urology;  Laterality: N/A;  . Transurethral resection of prostate N/A 08/06/2012    Procedure: FULGERATION OF PROSTATE WITH GYRUS ;  Surgeon: [name], MD;  Location: [facility];  Service: Urology;  Laterality: N/A;  . Cardiac catheterization Right 2006    5 heart stents  . Esophagogastric fundoplication  9485    spleen removed, 5 pints of blood given  . Tonsillectomy  as child  . Hernia repair  1985    x4  . Prostate ablation  2013    "laser ablation"  . Cataract extraction Bilateral 6 years ago  . 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  Visit Diagnosis:   Decreased range of motion of knee, right  Weakness of right lower extremity  Difficulty walking  Abnormality of gait      Subjective Assessment - 08/24/14 1414    Symptoms Pt presented with SPC in Rt hand upon arrival, step through pattern. "I got rid of the walker this weekend"    Pain Score 4    Pain Location Knee   Pain Orientation Right   Pain Descriptors / Indicators Aching;Dull   Aggravating Factors  standing,  walking    Pain Relieving Factors medicine, ice             [facility] PT Assessment - 08/24/14 0001    Assessment   Medical Diagnosis R TKA   Onset Date 08/03/14   Next MD Visit 09/08/14   Prior Therapy HHPT   Precautions   Precautions None   AROM   AROM Assessment Site Knee   Right/Left Knee Right   Right Knee Extension -9   Right Knee Flexion 88  seated   Strength   Strength Assessment Site Hip;Knee   Right/Left Hip Right   Right Hip Flexion 4+/5   Right Hip ABduction 4+/5                   [facility] Adult PT  Treatment/Exercise - 08/24/14 0001    Exercises   Exercises Knee/Hip   Knee/Hip Exercises: Aerobic   Stationary Bike NuStep level 4 x 5 min    Knee/Hip Exercises: Standing   Lateral Step Up 1 set;10 reps;Hand Hold: 2;Step Height: 6"   Forward Step Up 1 set;Right;10 reps;Hand Hold: 2;Step Height: 6"   Knee/Hip Exercises: Seated   Long Arc Quad Strengthening;Right;2 sets;10 reps;Weights   Long Arc Quad Weight --  2.5   Other Seated Knee Exercises Seated scoots with 15 sec hold x 10 reps to increase Rt knee flex. (able to reach 88 deg), Hip abd/add with Rt foot on rolling stool x 25.    Knee/Hip Exercises: Supine   Quad Sets Right;10 reps  with foot on rolling stool.    Straight Leg Raises Right;Strengthening;2 sets;10 reps   Knee/Hip Exercises: Sidelying   Hip ABduction Strengthening;Right;2 sets;10 reps   Cryotherapy   Number Minutes Cryotherapy 12 Minutes  ended early due to Rt hip pain.    Cryotherapy Location Knee  Rt   Type  of Cryotherapy --  vaso, 3 snowflakes, med pressure                PT Education - 08/24/14 1510    Education provided Yes   Education Details HEP    Person(s) Educated Patient   Methods Explanation;Handout   Comprehension Verbalized understanding;Returned demonstration;Tactile cues required          PT Short Term Goals - 08/24/14 1511    PT SHORT TERM GOAL #1   Title independent with HEP (09/16/14)   Time 4   Period Weeks   Status On-going   PT SHORT TERM GOAL #2   Title improve R knee AROM 10-90 degrees for improved function (09/16/14)   Time 4   Period Weeks   Status On-going   PT SHORT TERM GOAL #3   Title ambulate > 16' with single point cane modified independent for improved function (09/16/14)   Time 4   Period Weeks   Status Achieved   PT SHORT TERM GOAL #4   Title report ability to ambulate > 20 min with LRAD without increase in pain for improved funciton (09/16/14)   Time 4   Period Weeks   Status On-going           PT Long Term Goals - 08/19/14 1316    PT LONG TERM GOAL #1   Title independent with advanced HEP for home and community (10/14/14)   Time 8   Period Weeks   Status New   PT LONG TERM GOAL #2   Title improve R knee AROM 0-100 degrees for improved function and mobility (10/14/14)   Time 8   Period Weeks   Status New   PT LONG TERM GOAL #3   Title ambulate > 100' without a device for improved function and mobility (10/14/14)   Time 8   Period Weeks   Status New   PT LONG TERM GOAL #4   Title perform RLE SLS > 5 sec for improved strength and function (10/14/14)   Time 8   Period Weeks   Status New               Plan - 08/24/14 1505    Clinical Impression Statement Pt demonstrated improved Rt knee ROM.  Pt able to tolerate 6" step ups this visit as well as weighted LAQ.  Pt reported Rt hip pain with vaso at med pressure, decreased when Vaso  ended. Pt may tolerate low pressure better. STG #3 met.    Pt will benefit from  skilled therapeutic intervention in order to improve on the following deficits Abnormal gait;Decreased range of motion;Difficulty walking;Impaired flexibility;Pain;Increased edema;Decreased activity tolerance;Decreased scar mobility;Decreased balance;Decreased knowledge of use of DME;Decreased mobility;Decreased strength   Rehab Potential Good   PT Frequency 3x / week   PT Duration 8 weeks   PT Treatment/Interventions ADLs/Self Care Home Management;Therapeutic activities;Patient/family education;Moist Heat;Scar mobilization;Passive range of motion;DME Instruction;Therapeutic exercise;Gait training;Balance training;Manual techniques;Neuromuscular re-education;Stair training;Cryotherapy;Electrical Stimulation;Functional mobility training   PT Next Visit Plan aggressive ROM, add to HEP; strengthening.   Consulted and Agree with Plan of Care Patient;Family member/caregiver   Family Member Consulted girlfriend         Problem List Patient Active Problem List   Diagnosis Date Noted  . OA (osteoarthritis) of knee 08/03/2014  . Coronary artery disease 07/10/2014  . Essential hypertension 07/10/2014  . Hyperlipidemia 07/10/2014   Kerin Perna, PTA 08/24/2014 3:15 PM   [facility] [place], [place], [address] Phone: [phone]   Fax:  [phone]

## 2014-08-26 ENCOUNTER — Encounter: Payer: Self-pay | Admitting: Physical Therapy

## 2014-08-26 ENCOUNTER — Ambulatory Visit (INDEPENDENT_AMBULATORY_CARE_PROVIDER_SITE_OTHER): Payer: Medicare Other | Admitting: Physical Therapy

## 2014-08-26 DIAGNOSIS — R29898 Other symptoms and signs involving the musculoskeletal system: Secondary | ICD-10-CM | POA: Diagnosis not present

## 2014-08-26 DIAGNOSIS — M24661 Ankylosis, right knee: Secondary | ICD-10-CM

## 2014-08-26 NOTE — Therapy (Signed)
[place], [place], [address] Phone: [phone]   Fax:  [phone]  Physical Therapy Treatment  Patient Details  Name: [name] Glass MRN: [id number] Date of Birth: 1940/06/23 Referring Provider:  [name], MD  Encounter Date: 08/26/2014      PT End of Session - 08/26/14 1438    Visit Number 4   Number of Visits 16   Date for PT Re-Evaluation 10/18/14   PT Start Time 7564   PT Stop Time 1530   PT Time Calculation (min) 54 min   Activity Tolerance Patient tolerated treatment well      Past Medical History  Diagnosis Date  . GERD (gastroesophageal reflux disease)   . Coronary artery disease   . Hypothyroidism   . Sleep apnea     hx of, surgery to reverse  . H/O hiatal hernia   . Arthritis   . Hyperlipidemia     statin intolerant, under control  . Hypertension     under control  . H/O bronchitis   . History of kidney stones last in 1976    Past Surgical History  Procedure Laterality Date  . Uvulopalatopharyngoplasty  6 years ago  . Cystoscopy N/A 08/06/2012    Procedure: CYSTOSCOPY;  Surgeon: [name], MD;  Location: [facility];  Service: Urology;  Laterality: N/A;  . Transurethral resection of prostate N/A 08/06/2012    Procedure: FULGERATION OF PROSTATE WITH GYRUS ;  Surgeon: [name], MD;  Location: [facility];  Service: Urology;  Laterality: N/A;  . Cardiac catheterization Right 2006    5 heart stents  . Esophagogastric fundoplication  3329    spleen removed, 5 pints of blood given  . Tonsillectomy  as child  . Hernia repair  1985    x4  . Prostate ablation  2013    "laser ablation"  . Cataract extraction Bilateral 6 years ago  . 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  Visit Diagnosis:   Decreased range of motion of knee, right  Weakness of right lower extremity      Subjective Assessment - 08/26/14 1437    Symptoms Pt reports he sees changes about every day. Continues to use cane in the community and no assistive device in the house   Currently in Pain? Yes   Pain Score 2    Pain Location Knee   Pain Orientation Right   Pain Descriptors / Indicators Dull            [facility] PT Assessment - 08/26/14 0001    AROM   Right Knee Extension -9   Right Knee Flexion 93                   [facility] Adult PT Treatment/Exercise - 08/26/14 0001    Knee/Hip Exercises: Stretches   Active Hamstring Stretch 3 reps;20 seconds  seated with manual over pressure   Knee/Hip Exercises: Aerobic   Stationary Bike NuStep level 4 x 5 min    Knee/Hip Exercises: Standing   Terminal Knee Extension Right  30 reps, pressing into a ball   Wall Squat 3 sets;10 reps  mini, VC's for form   Modalities   Modalities Cryotherapy;Moist Heat   Moist Heat Therapy   Number Minutes Moist  Heat 15 Minutes   Moist Heat Location --  Rt hamstring   Cryotherapy   Number Minutes Cryotherapy 15 Minutes   Cryotherapy Location Knee  Rt anterior   Type of Cryotherapy Ice pack   Manual Therapy   Manual Therapy Joint mobilization;Passive ROM;Myofascial release   Joint Mobilization Rt knee flexion and extension grade III mobs   Myofascial Release STW to Rt hamstring and gastroc in supine and seated with dowel   Passive ROM Rt knee flexion/ext                  PT Short Term Goals - 08/24/14 1511    PT SHORT TERM GOAL #1   Title independent with HEP (09/16/14)   Time 4   Period Weeks   Status On-going   PT SHORT TERM GOAL #2   Title improve R knee AROM 10-90 degrees for improved function (09/16/14)   Time 4   Period Weeks   Status On-going   PT SHORT TERM GOAL #3   Title ambulate > 42' with single point cane modified independent for improved function (09/16/14)   Time 4   Period  Weeks   Status Achieved   PT SHORT TERM GOAL #4   Title report ability to ambulate > 20 min with LRAD without increase in pain for improved funciton (09/16/14)   Time 4   Period Weeks   Status On-going           PT Long Term Goals - 08/19/14 1316    PT LONG TERM GOAL #1   Title independent with advanced HEP for home and community (10/14/14)   Time 8   Period Weeks   Status New   PT LONG TERM GOAL #2   Title improve R knee AROM 0-100 degrees for improved function and mobility (10/14/14)   Time 8   Period Weeks   Status New   PT LONG TERM GOAL #3   Title ambulate > 100' without a device for improved function and mobility (10/14/14)   Time 8   Period Weeks   Status New   PT LONG TERM GOAL #4   Title perform RLE SLS > 5 sec for improved strength and function (10/14/14)   Time 8   Period Weeks   Status New               Plan - 08/26/14 1519    Clinical Impression Statement Pt continues to have tightness in the Rt knee.  tight hamstrings on the Rt is the biggest limiting factor in knee extension.    Pt will benefit from skilled therapeutic intervention in order to improve on the following deficits Abnormal gait;Decreased range of motion;Difficulty walking;Impaired flexibility;Pain;Increased edema;Decreased activity tolerance;Decreased scar mobility;Decreased balance;Decreased knowledge of use of DME;Decreased mobility;Decreased strength   PT Frequency 3x / week   PT Duration 8 weeks   PT Next Visit Plan agrressive ROM, STW to Rt hamstrings   Consulted and Agree with Plan of Care Patient        Problem List Patient Active Problem List   Diagnosis Date Noted  . OA (osteoarthritis) of knee 08/03/2014  . Coronary artery disease 07/10/2014  . Essential hypertension 07/10/2014  . Hyperlipidemia 07/10/2014    Jeral Pinch, PT 08/26/2014, 3:21 PM  [facility] [place], [place], [address] Phone:  [phone]   Fax:  [phone]

## 2014-08-27 ENCOUNTER — Ambulatory Visit (INDEPENDENT_AMBULATORY_CARE_PROVIDER_SITE_OTHER): Payer: Medicare Other | Admitting: Physical Therapy

## 2014-08-27 DIAGNOSIS — M24661 Ankylosis, right knee: Secondary | ICD-10-CM

## 2014-08-27 NOTE — Therapy (Signed)
[place], [place], [address] Phone: [phone]   Fax:  [phone]  Physical Therapy Treatment  Patient Details  Name: [name] Glass MRN: [id number] Date of Birth: [birth year] Referring Provider:  [name], MD  Encounter Date: 08/27/2014      PT End of Session - 08/27/14 1405    Visit Number 5   Number of Visits 16   Date for PT Re-Evaluation 10/18/14   PT Start Time 6606   PT Stop Time 1504   PT Time Calculation (min) 60 min   Activity Tolerance Patient limited by pain;Patient tolerated treatment well   Behavior During Therapy [facility] for tasks assessed/performed      Past Medical History  Diagnosis Date  . GERD (gastroesophageal reflux disease)   . Coronary artery disease   . Hypothyroidism   . Sleep apnea     hx of, surgery to reverse  . H/O hiatal hernia   . Arthritis   . Hyperlipidemia     statin intolerant, under control  . Hypertension     under control  . H/O bronchitis   . History of kidney stones last in 1976    Past Surgical History  Procedure Laterality Date  . Uvulopalatopharyngoplasty  6 years ago  . Cystoscopy N/A 08/06/2012    Procedure: CYSTOSCOPY;  Surgeon: [name], MD;  [facility]: [facility];  Service: Urology;  Laterality: N/A;  . Transurethral resection of prostate N/A 08/06/2012    Procedure: FULGERATION OF PROSTATE WITH GYRUS ;  Surgeon: [name], MD;  Location: [facility];  Service: Urology;  Laterality: N/A;  . Cardiac catheterization Right 2006    5 heart stents  . Esophagogastric fundoplication  3016    spleen removed, 5 pints of blood given  . Tonsillectomy  as child  . Hernia repair  1985    x4  . Prostate ablation  2013    "laser ablation"  . Cataract extraction Bilateral 6 years ago  . 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  Visit Diagnosis:  Decreased range of motion of knee, right      Subjective Assessment - 08/27/14 1406    Symptoms "My knee is hurting me a bit today becasue Ive been doing more stretching" Walkied 1/4 mile today.   Currently in Pain? Yes   Pain Score 3    Pain Location Knee  radiating up to hip today   Pain Orientation Right   Pain Descriptors / Indicators Dull   Pain Type Acute pain   Pain Radiating Towards right hip                       [facility] Adult PT Treatment/Exercise - 08/27/14 0001    Knee/Hip Exercises: Aerobic   Stationary Bike NuStep L4 x 6 min   Modalities   Modalities Cryotherapy;Moist Heat;Ultrasound   Moist Heat Therapy   Number Minutes Moist Heat 15 Minutes   Moist Heat Location --  right HS   Cryotherapy   Number Minutes Cryotherapy 15 Minutes   Cryotherapy Location Knee  Rt anterior   Ultrasound   Ultrasound Location right HS and ITB   Ultrasound Parameters 1.5 Wcm2 1 MHZ cont x 8 min   Ultrasound Goals Other (Comment)  for increased flexibility   Manual Therapy   Manual Therapy Joint mobilization;Myofascial release;Passive ROM   Joint Mobilization Rt knee grade III post    Myofascial Release STW to right HS, ITB and gastroc in prone and then ITB in Sidelying   Passive ROM Rt knee extension                PT Education - 08/27/14 1503    Education provided Yes   Education Details HEP   Person(s) Educated Patient   Methods Explanation;Demonstration;Handout   Comprehension Verbalized understanding;Returned demonstration          PT Short Term Goals - 08/24/14 1511    PT SHORT TERM GOAL #1   Title independent with HEP (09/16/14)   Time 4   Period Weeks   Status On-going   PT SHORT TERM GOAL #2   Title improve R knee AROM 10-90 degrees for improved function (09/16/14)   Time 4   Period Weeks   Status On-going   PT SHORT TERM GOAL #3   Title ambulate  > 79' with single point cane modified independent for improved function (09/16/14)   Time 4   Period Weeks   Status Achieved   PT SHORT TERM GOAL #4   Title report ability to ambulate > 20 min with LRAD without increase in pain for improved funciton (09/16/14)   Time 4   Period Weeks   Status On-going           PT Long Term Goals - 08/19/14 1316    PT LONG TERM GOAL #1   Title independent with advanced HEP for home and community (10/14/14)   Time 8   Period Weeks   Status New   PT LONG TERM GOAL #2   Title improve R knee AROM 0-100 degrees for improved function and mobility (10/14/14)   Time 8   Period Weeks   Status New   PT LONG TERM GOAL #3   Title ambulate > 100' without a device for improved function and mobility (10/14/14)   Time 8   Period Weeks   Status New   PT LONG TERM GOAL #4   Title perform RLE SLS > 5 sec for improved strength and function (10/14/14)   Time 8   Period Weeks   Status New               Plan - 08/27/14 1505    Clinical Impression Statement Pt reports increased pain along ITB, HS and calf today which likely is from increased stretching yesterday.   PT Next Visit Plan agrressive ROM, STW to Rt hamstrings, hamstrings and gastroc as needed. Review ITB stretch attempt supine if sidelying too difficult.        Problem List Patient Active Problem List   Diagnosis Date Noted  . OA (osteoarthritis) of knee 08/03/2014  . Coronary artery disease 07/10/2014  . Essential hypertension 07/10/2014  . Hyperlipidemia 07/10/2014    Madelyn Flavors, PT 08/27/2014 3:20 PM  [facility] [place], [place], [address] Phone: [phone]   Fax:  [phone]

## 2014-08-27 NOTE — Patient Instructions (Signed)
Iliotibial Band Stretch, Side-Lying   Lie on side, Keeping head and shoulders toward the middle of the bed and your hips at the edge of bed, top arm in front. Allow top leg to drape behind over edge. Hold _30__ seconds.  Repeat _3 times per session. Do __2_ sessions per day.  I

## 2014-08-31 ENCOUNTER — Ambulatory Visit (INDEPENDENT_AMBULATORY_CARE_PROVIDER_SITE_OTHER): Payer: Medicare Other | Admitting: Physical Therapy

## 2014-08-31 DIAGNOSIS — M24661 Ankylosis, right knee: Secondary | ICD-10-CM

## 2014-08-31 DIAGNOSIS — R29898 Other symptoms and signs involving the musculoskeletal system: Secondary | ICD-10-CM

## 2014-08-31 DIAGNOSIS — R05 Cough: Secondary | ICD-10-CM | POA: Diagnosis not present

## 2014-08-31 DIAGNOSIS — R262 Difficulty in walking, not elsewhere classified: Secondary | ICD-10-CM

## 2014-08-31 DIAGNOSIS — R269 Unspecified abnormalities of gait and mobility: Secondary | ICD-10-CM

## 2014-08-31 DIAGNOSIS — R591 Generalized enlarged lymph nodes: Secondary | ICD-10-CM | POA: Diagnosis not present

## 2014-08-31 NOTE — Therapy (Signed)
[place], [place], [address] Phone: [phone]   Fax:  [phone]  Physical Therapy Treatment  Patient Details  Name: [name] Glass MRN: [id number] Date of Birth: [birth year] Referring Provider:  [name], MD  Encounter Date: 08/31/2014      PT End of Session - 08/31/14 1515    Visit Number 6   Number of Visits 16   Date for PT Re-Evaluation 10/18/14   PT Start Time 1430   PT Stop Time 7353   PT Time Calculation (min) 44 min   Behavior During Therapy [facility] for tasks assessed/performed      Past Medical History  Diagnosis Date  . GERD (gastroesophageal reflux disease)   . Coronary artery disease   . Hypothyroidism   . Sleep apnea     hx of, surgery to reverse  . H/O hiatal hernia   . Arthritis   . Hyperlipidemia     statin intolerant, under control  . Hypertension     under control  . H/O bronchitis   . History of kidney stones last in 1976    Past Surgical History  Procedure Laterality Date  . Uvulopalatopharyngoplasty  6 years ago  . Cystoscopy N/A 08/06/2012    Procedure: CYSTOSCOPY;  Surgeon: [name], MD;  [facility];  Service: Urology;  Laterality: N/A;  . Transurethral resection of prostate N/A 08/06/2012    Procedure: FULGERATION OF PROSTATE WITH GYRUS ;  Surgeon: [name], MD;  [facility];  Service: Urology;  Laterality: N/A;  . Cardiac catheterization Right 2006    5 heart stents  . Esophagogastric fundoplication  2992    spleen removed, 5 pints of blood given  . Tonsillectomy  as child  . Hernia repair  1985    x4  . Prostate ablation  2013    "laser ablation"  . Cataract extraction Bilateral 6 years ago  . 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  Visit  Diagnosis:  Decreased range of motion of knee, right  Weakness of right lower extremity  Difficulty walking  Abnormality of gait      Subjective Assessment - 08/31/14 1433    Symptoms Knee still hurts; walking without cane today.  Reports not using cane at all today.   Patient Stated Goals improve ROM R knee; walk without device, negotiate stairs   Currently in Pain? Yes   Pain Score 2    Pain Location Knee   Pain Orientation Right   Pain Descriptors / Indicators Dull   Pain Type Acute pain   Pain Onset 1 to 4 weeks ago   Pain Frequency Intermittent   Aggravating Factors  standing/walking   Pain Relieving Factors meds/ice                       [facility] Adult PT Treatment/Exercise - 08/31/14 1434    Knee/Hip Exercises: Aerobic   Stationary Bike NuStep L4 x 5 min; rec bike partial revolutions x 5 min   Knee/Hip Exercises: Supine   Heel Prop for Knee Extension Other (comment)   Heel Prop for Knee Extension Limitations 10 min with ice/heat   Modalities   Modalities Cryotherapy;Moist Heat;Ultrasound   Moist Heat Therapy   Number Minutes Moist  Heat 10 Minutes   Moist Heat Location Other (comment)  post R knee   Cryotherapy   Number Minutes Cryotherapy 10 Minutes   Cryotherapy Location Knee;Other (comment)  anterior   Type of Cryotherapy Ice pack   Ultrasound   Ultrasound Location right post knee/HS   Ultrasound Parameters 1.5 w/cm2; 1 mHz, cont x 8 min   Ultrasound Goals Other (Comment)  flexibility   Manual Therapy   Manual Therapy Joint mobilization;Myofascial release;Passive ROM   Joint Mobilization Rt knee grade III post    Myofascial Release STW to right HS, ITB and gastroc in prone and then ITB in Sidelying   Passive ROM Rt knee extension                  PT Short Term Goals - 08/24/14 1511    PT SHORT TERM GOAL #1   Title independent with HEP (09/16/14)   Time 4   Period Weeks   Status On-going   PT SHORT TERM GOAL #2   Title improve  R knee AROM 10-90 degrees for improved function (09/16/14)   Time 4   Period Weeks   Status On-going   PT SHORT TERM GOAL #3   Title ambulate > 34' with single point cane modified independent for improved function (09/16/14)   Time 4   Period Weeks   Status Achieved   PT SHORT TERM GOAL #4   Title report ability to ambulate > 20 min with LRAD without increase in pain for improved funciton (09/16/14)   Time 4   Period Weeks   Status On-going           PT Long Term Goals - 08/19/14 1316    PT LONG TERM GOAL #1   Title independent with advanced HEP for home and community (10/14/14)   Time 8   Period Weeks   Status New   PT LONG TERM GOAL #2   Title improve R knee AROM 0-100 degrees for improved function and mobility (10/14/14)   Time 8   Period Weeks   Status New   PT LONG TERM GOAL #3   Title ambulate > 100' without a device for improved function and mobility (10/14/14)   Time 8   Period Weeks   Status New   PT LONG TERM GOAL #4   Title perform RLE SLS > 5 sec for improved strength and function (10/14/14)   Time 8   Period Weeks   Status New               Plan - 08/31/14 1515    Clinical Impression Statement Continues to demonstrate decreased ROM, will plan to measure next visit.   PT Next Visit Plan agrressive ROM, STW to Rt hamstrings, hamstrings and gastroc as needed. Review ITB stretch attempt supine if sidelying too difficult.   Consulted and Agree with Plan of Care Patient        Problem List Patient Active Problem List   Diagnosis Date Noted  . OA (osteoarthritis) of knee 08/03/2014  . Coronary artery disease 07/10/2014  . Essential hypertension 07/10/2014  . Hyperlipidemia 07/10/2014   Laureen Abrahams, PT, DPT 08/31/2014 3:19 PM  [facility] [place], [place], [address] Phone: [phone]   Fax:  [phone]

## 2014-09-02 ENCOUNTER — Ambulatory Visit (INDEPENDENT_AMBULATORY_CARE_PROVIDER_SITE_OTHER): Payer: Medicare Other | Admitting: Physical Therapy

## 2014-09-02 DIAGNOSIS — R29898 Other symptoms and signs involving the musculoskeletal system: Secondary | ICD-10-CM

## 2014-09-02 DIAGNOSIS — R262 Difficulty in walking, not elsewhere classified: Secondary | ICD-10-CM

## 2014-09-02 DIAGNOSIS — M24661 Ankylosis, right knee: Secondary | ICD-10-CM | POA: Diagnosis present

## 2014-09-02 DIAGNOSIS — R269 Unspecified abnormalities of gait and mobility: Secondary | ICD-10-CM

## 2014-09-02 NOTE — Therapy (Signed)
[place], [place], [address] Phone: [phone]   Fax:  [phone]  Physical Therapy Treatment  Patient Details  Name: [name] Glass MRN: [id number] Date of Birth: 1941-04-11 Referring Provider:  [name], MD  Encounter Date: 09/02/2014      PT End of Session - 09/02/14 1512    Visit Number 7   Number of Visits 16   Date for PT Re-Evaluation 10/18/14   PT Start Time 1427   PT Stop Time 1525   PT Time Calculation (min) 58 min   Activity Tolerance Patient limited by pain;Patient tolerated treatment well   Behavior During Therapy [facility] for tasks assessed/performed      Past Medical History  Diagnosis Date  . GERD (gastroesophageal reflux disease)   . Coronary artery disease   . Hypothyroidism   . Sleep apnea     hx of, surgery to reverse  . H/O hiatal hernia   . Arthritis   . Hyperlipidemia     statin intolerant, under control  . Hypertension     under control  . H/O bronchitis   . History of kidney stones last in 1976    Past Surgical History  Procedure Laterality Date  . Uvulopalatopharyngoplasty  6 years ago  . Cystoscopy N/A 08/06/2012    Procedure: CYSTOSCOPY;  Surgeon: [name], MD;  [facility];  Service: Urology;  Laterality: N/A;  . Transurethral resection of prostate N/A 08/06/2012    Procedure: FULGERATION OF PROSTATE WITH GYRUS ;  Surgeon: [name], MD;  [facility];  Service: Urology;  Laterality: N/A;  . Cardiac catheterization Right 2006    5 heart stents  . Esophagogastric fundoplication  9767    spleen removed, 5 pints of blood given  . Tonsillectomy  as child  . Hernia repair  1985    x4  . Prostate ablation  2013    "laser ablation"  . Cataract extraction Bilateral 6 years ago  . 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  Visit Diagnosis:  Decreased range of motion of knee, right  Weakness of right lower extremity  Difficulty walking  Abnormality of gait      Subjective Assessment - 09/02/14 1431    Symptoms "feels like it hurts like hell"   Patient Stated Goals improve ROM R knee; walk without device, negotiate stairs   Currently in Pain? Yes   Pain Score 4   "1-4"   Pain Location Knee   Pain Orientation Right            [facility] PT Assessment - 09/02/14 1449    AROM   Right/Left Knee Right   Right Knee Extension 11   Right Knee Flexion 107  AA supine with strap                   [facility] Adult PT Treatment/Exercise - 09/02/14 1432    Knee/Hip Exercises: Aerobic   Stationary Bike Partial revolutions x 8 min; gradually moved seat closer for increased ROM   Knee/Hip Exercises: Supine   Quad Sets Strengthening;Right;20 reps   Quad Sets Limitations with heel prop for knee ext   Heel Slides AAROM;Right;20 reps;1 set   Heel Slides Limitations with strap, 5-10 sec hold  Modalities   Modalities Cryotherapy   Cryotherapy   Number Minutes Cryotherapy 15 Minutes   Cryotherapy Location Knee   Type of Cryotherapy Other (comment)  vaso mod pressure   Manual Therapy   Manual Therapy Joint mobilization;Passive ROM   Joint Mobilization Rt knee grade III post    Passive ROM Rt knee extension                PT Education - 09/02/14 1512    Education provided Yes   Education Details increased extension for home; resting with heel prop   Person(s) Educated Patient   Methods Explanation   Comprehension Verbalized understanding          PT Short Term Goals - 08/24/14 1511    PT SHORT TERM GOAL #1   Title independent with HEP (09/16/14)   Time 4   Period Weeks   Status On-going   PT SHORT TERM GOAL #2   Title improve R knee AROM 10-90 degrees for improved function (09/16/14)   Time 4   Period Weeks    Status On-going   PT SHORT TERM GOAL #3   Title ambulate > 31' with single point cane modified independent for improved function (09/16/14)   Time 4   Period Weeks   Status Achieved   PT SHORT TERM GOAL #4   Title report ability to ambulate > 20 min with LRAD without increase in pain for improved funciton (09/16/14)   Time 4   Period Weeks   Status On-going           PT Long Term Goals - 08/19/14 1316    PT LONG TERM GOAL #1   Title independent with advanced HEP for home and community (10/14/14)   Time 8   Period Weeks   Status New   PT LONG TERM GOAL #2   Title improve R knee AROM 0-100 degrees for improved function and mobility (10/14/14)   Time 8   Period Weeks   Status New   PT LONG TERM GOAL #3   Title ambulate > 100' without a device for improved function and mobility (10/14/14)   Time 8   Period Weeks   Status New   PT LONG TERM GOAL #4   Title perform RLE SLS > 5 sec for improved strength and function (10/14/14)   Time 8   Period Weeks   Status New               Plan - 09/02/14 1512    Clinical Impression Statement R knee flexion continues to improve; extension remains limited.  Will continue to benefit from PT to maximize function.   PT Next Visit Plan agrressive ROM, STW to Rt hamstrings, hamstrings and gastroc as needed. Review ITB stretch attempt supine if sidelying too difficult.   Consulted and Agree with Plan of Care Patient        Problem List Patient Active Problem List   Diagnosis Date Noted  . OA (osteoarthritis) of knee 08/03/2014  . Coronary artery disease 07/10/2014  . Essential hypertension 07/10/2014  . Hyperlipidemia 07/10/2014   Laureen Abrahams, PT, DPT 09/02/2014 3:40 PM  [facility] [place], [place], [address] Phone: [phone]   Fax:  [phone]

## 2014-09-04 ENCOUNTER — Ambulatory Visit (INDEPENDENT_AMBULATORY_CARE_PROVIDER_SITE_OTHER): Payer: Medicare Other | Admitting: Physical Therapy

## 2014-09-04 DIAGNOSIS — M24661 Ankylosis, right knee: Secondary | ICD-10-CM

## 2014-09-04 DIAGNOSIS — R29898 Other symptoms and signs involving the musculoskeletal system: Secondary | ICD-10-CM

## 2014-09-04 NOTE — Therapy (Signed)
[place], [place], [address] Phone: [phone]   Fax:  [phone]  Physical Therapy Treatment  Patient Details  Name: [name] Glass MRN: [id number] Date of Birth: [birth year] Referring Provider:  [name], MD  Encounter Date: 09/04/2014      PT End of Session - 09/04/14 1413    Visit Number 8   Number of Visits 16   Date for PT Re-Evaluation 10/18/14   PT Start Time 2993   PT Stop Time 1503   PT Time Calculation (min) 61 min      Past Medical History  Diagnosis Date  . GERD (gastroesophageal reflux disease)   . Coronary artery disease   . Hypothyroidism   . Sleep apnea     hx of, surgery to reverse  . H/O hiatal hernia   . Arthritis   . Hyperlipidemia     statin intolerant, under control  . Hypertension     under control  . H/O bronchitis   . History of kidney stones last in 1976    Past Surgical History  Procedure Laterality Date  . Uvulopalatopharyngoplasty  6 years ago  . Cystoscopy N/A 08/06/2012    Procedure: CYSTOSCOPY;  Surgeon: [name], MD;  [facility]: [facility];  Service: Urology;  Laterality: N/A;  . Transurethral resection of prostate N/A 08/06/2012    Procedure: FULGERATION OF PROSTATE WITH GYRUS ;  Surgeon: [name], MD;  [facility];  Service: Urology;  Laterality: N/A;  . Cardiac catheterization Right 2006    5 heart stents  . Esophagogastric fundoplication  7169    spleen removed, 5 pints of blood given  . Tonsillectomy  as child  . Hernia repair  1985    x4  . Prostate ablation  2013    "laser ablation"  . Cataract extraction Bilateral 6 years ago  . 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  Visit Diagnosis:  Decreased range of motion of knee, right  Weakness  of right lower extremity      Subjective Assessment - 09/04/14 1414    Symptoms I think we over did it on Wed, my knee has been killing me ever since. Not able to sleep or get comfortable.    Pain Score 5    Pain Location Knee   Pain Orientation Right   Pain Descriptors / Indicators Sore;Dull;Sharp   Pain Type Surgical pain                       [facility] Adult PT Treatment/Exercise - 09/04/14 0001    Knee/Hip Exercises: Stretches   Active Hamstring Stretch 30 seconds  with strap and mini quad sets   Knee/Hip Exercises: Aerobic   Stationary Bike nu step L4 x9'   Knee/Hip Exercises: Standing   Terminal Knee Extension --  30 reps with ball behind knee   Knee/Hip Exercises: Supine   Short Arc Quad Sets 20 reps;Right  5 sec holds   Other Supine Knee Exercises 3x10 ankle DF with red band   Modalities   Modalities Cryotherapy;Electrical Stimulation   Moist Heat Therapy   Number Minutes Moist Heat 10 Minutes   Moist Heat Location --  posterior Rt knee/thigh   Cryotherapy   Number Minutes Cryotherapy  10 Minutes   Cryotherapy Location Knee  Rt anterior   Type of Cryotherapy Ice pack   Electrical Stimulation   Electrical Stimulation Location Rt knee   Electrical Stimulation Action ion repelling   Electrical Stimulation Parameters to tolerance   Electrical Stimulation Goals Pain;Edema   Ultrasound   Ultrasound Location lateral Rt knee   Ultrasound Parameters 50%, 1. w/cm2,3.80mhz   Ultrasound Goals Pain  tissue healing   Manual Therapy   Manual Therapy Joint mobilization;Myofascial release   Joint Mobilization posterior talus mobs Rt grade III    Myofascial Release STW to lateral Rt knee                  PT Short Term Goals - 08/24/14 1511    PT SHORT TERM GOAL #1   Title independent with HEP (09/16/14)   Time 4   Period Weeks   Status On-going   PT SHORT TERM GOAL #2   Title improve R knee AROM 10-90 degrees for improved function (09/16/14)   Time  4   Period Weeks   Status On-going   PT SHORT TERM GOAL #3   Title ambulate > 56' with single point cane modified independent for improved function (09/16/14)   Time 4   Period Weeks   Status Achieved   PT SHORT TERM GOAL #4   Title report ability to ambulate > 20 min with LRAD without increase in pain for improved funciton (09/16/14)   Time 4   Period Weeks   Status On-going           PT Long Term Goals - 08/19/14 1316    PT LONG TERM GOAL #1   Title independent with advanced HEP for home and community (10/14/14)   Time 8   Period Weeks   Status New   PT LONG TERM GOAL #2   Title improve R knee AROM 0-100 degrees for improved function and mobility (10/14/14)   Time 8   Period Weeks   Status New   PT LONG TERM GOAL #3   Title ambulate > 100' without a device for improved function and mobility (10/14/14)   Time 8   Period Weeks   Status New   PT LONG TERM GOAL #4   Title perform RLE SLS > 5 sec for improved strength and function (10/14/14)   Time 8   Period Weeks   Status New               Plan - 09/04/14 1424    Clinical Impression Statement Pt very tender in Rt LCL area after stretching into extension on Wed.    Pt will benefit from skilled therapeutic intervention in order to improve on the following deficits Abnormal gait;Decreased range of motion;Difficulty walking;Impaired flexibility;Pain;Increased edema;Decreased activity tolerance;Decreased scar mobility;Decreased balance;Decreased knowledge of use of DME;Decreased mobility;Decreased strength   Rehab Potential Good   PT Frequency 3x / week   PT Duration 8 weeks   PT Treatment/Interventions ADLs/Self Care Home Management;Therapeutic activities;Patient/family education;Moist Heat;Scar mobilization;Passive range of motion;DME Instruction;Therapeutic exercise;Gait training;Balance training;Manual techniques;Neuromuscular re-education;Stair training;Cryotherapy;Electrical Stimulation;Functional mobility training    Consulted and Agree with Plan of Care Patient        Problem List Patient Active Problem List   Diagnosis Date Noted  . OA (osteoarthritis) of knee 08/03/2014  . Coronary artery disease 07/10/2014  . Essential hypertension 07/10/2014  . Hyperlipidemia 07/10/2014    Jeral Pinch, PT 09/04/2014, 3:23 PM  [facility] [address] [phone]  [place], [place], [address] Phone: [phone]   Fax:  [phone]

## 2014-09-07 ENCOUNTER — Ambulatory Visit (INDEPENDENT_AMBULATORY_CARE_PROVIDER_SITE_OTHER): Payer: Medicare Other | Admitting: Physical Therapy

## 2014-09-07 DIAGNOSIS — R262 Difficulty in walking, not elsewhere classified: Secondary | ICD-10-CM

## 2014-09-07 DIAGNOSIS — R29898 Other symptoms and signs involving the musculoskeletal system: Secondary | ICD-10-CM | POA: Diagnosis not present

## 2014-09-07 DIAGNOSIS — M24661 Ankylosis, right knee: Secondary | ICD-10-CM | POA: Diagnosis present

## 2014-09-07 NOTE — Therapy (Signed)
[place], [place], [address] Phone: [phone]   Fax:  [phone]  Physical Therapy Treatment  Patient Details  Name: [name] Glass MRN: [id number] Date of Birth: [birth year] Referring Provider:  [name], MD  Encounter Date: 09/07/2014      PT End of Session - 09/07/14 1432    Visit Number 9   Number of Visits 16   Date for PT Re-Evaluation 10/18/14   PT Start Time 6568   PT Stop Time 1275   PT Time Calculation (min) 55 min      Past Medical History  Diagnosis Date  . GERD (gastroesophageal reflux disease)   . Coronary artery disease   . Hypothyroidism   . Sleep apnea     hx of, surgery to reverse  . H/O hiatal hernia   . Arthritis   . Hyperlipidemia     statin intolerant, under control  . Hypertension     under control  . H/O bronchitis   . History of kidney stones last in 1976    Past Surgical History  Procedure Laterality Date  . Uvulopalatopharyngoplasty  6 years ago  . Cystoscopy N/A 08/06/2012    Procedure: CYSTOSCOPY;  Surgeon: [name], MD;  Location: [facility];  Service: Urology;  Laterality: N/A;  . Transurethral resection of prostate N/A 08/06/2012    Procedure: FULGERATION OF PROSTATE WITH GYRUS ;  Surgeon: [name], MD;  Location: [facility];  Service: Urology;  Laterality: N/A;  . Cardiac catheterization Right 2006    5 heart stents  . Esophagogastric fundoplication  1700    spleen removed, 5 pints of blood given  . Tonsillectomy  as child  . Hernia repair  1985    x4  . Prostate ablation  2013    "laser ablation"  . Cataract extraction Bilateral 6 years ago  . 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  Visit Diagnosis:  Decreased range of motion of knee, right  Weakness  of right lower extremity  Difficulty walking      Subjective Assessment - 09/07/14 1439    Subjective Pt reports he just can't get comfortable at night; pain in Rt knee waking him at night. Pt able to climb stairs reciprocal pattern now.    Currently in Pain? Yes   Pain Score 4    Pain Location Knee   Pain Orientation Right   Pain Descriptors / Indicators Dull;Aching;Sore   Aggravating Factors  prolonged stillness (especially at night)    Pain Relieving Factors medicine/ rest/ ice             [facility] PT Assessment - 09/07/14 0001    ROM / Strength   AROM / PROM / Strength AROM;PROM;Strength   AROM   AROM Assessment Site Knee   Right/Left Knee Right   Right Knee Extension -11   Right Knee Flexion 112  with seated scoots    Strength   Strength Assessment Site Knee;Hip   Right/Left Hip Right   Right Hip Flexion --  5-/5   Right Hip Extension 4-/5   Right Hip ABduction 4/5   Left Hip Extension 4/5   Right Knee Flexion 5/5  with pain in hamstrings    Right Knee Extension 4+/5  [facility] Adult PT Treatment/Exercise - 09/07/14 0001    Exercises   Exercises Knee/Hip   Knee/Hip Exercises: Aerobic   Stationary Bike level 2 x 6 min    Knee/Hip Exercises: Standing   Lateral Step Up 20 reps;Hand Hold: 1;Step Height: 8";Right   Forward Step Up Right;15 reps;Hand Hold: 1;Step Height: 8"   SLS 3 sets of 15 on blue foam with light UE support.    Knee/Hip Exercises: Seated   Other Seated Knee Exercises Seated scoots to edge of mat to increase Rt knee flexion held 15 sec x 8 reps    Knee/Hip Exercises: Prone   Other Prone Exercises TKE with Rt toes curled x 10 with 5 sec hold.    Modalities   Modalities Cryotherapy;Moist Heat   Moist Heat Therapy   Number Minutes Moist Heat 12 Minutes   Moist Heat Location Other (comment)  post Rt knee    Cryotherapy   Number Minutes Cryotherapy 12 Minutes   Cryotherapy Location Knee   Type of Cryotherapy Ice pack                   PT Short Term Goals - 08/24/14 1511    PT SHORT TERM GOAL #1   Title independent with HEP (09/16/14)   Time 4   Period Weeks   Status On-going   PT SHORT TERM GOAL #2   Title improve R knee AROM 10-90 degrees for improved function (09/16/14)   Time 4   Period Weeks   Status On-going   PT SHORT TERM GOAL #3   Title ambulate > 67' with single point cane modified independent for improved function (09/16/14)   Time 4   Period Weeks   Status Achieved   PT SHORT TERM GOAL #4   Title report ability to ambulate > 20 min with LRAD without increase in pain for improved funciton (09/16/14)   Time 4   Period Weeks   Status On-going           PT Long Term Goals - 08/19/14 1316    PT LONG TERM GOAL #1   Title independent with advanced HEP for home and community (10/14/14)   Time 8   Period Weeks   Status New   PT LONG TERM GOAL #2   Title improve R knee AROM 0-100 degrees for improved function and mobility (10/14/14)   Time 8   Period Weeks   Status New   PT LONG TERM GOAL #3   Title ambulate > 100' without a device for improved function and mobility (10/14/14)   Time 8   Period Weeks   Status New   PT LONG TERM GOAL #4   Title perform RLE SLS > 5 sec for improved strength and function (10/14/14)   Time 8   Period Weeks   Status New               Plan - 09/07/14 1434    Clinical Impression Statement Pt continues to demonstrate decreased Rt knee extension, knee flexion slowly progressing.  Pt making strength gains in RLE.  Pt progressing towards goals and will benefit from continued PT to maximize functional independence.    Pt will benefit from skilled therapeutic intervention in order to improve on the following deficits Abnormal gait;Decreased range of motion;Difficulty walking;Impaired flexibility;Pain;Increased edema;Decreased activity tolerance;Decreased scar mobility;Decreased balance;Decreased knowledge of use of DME;Decreased  mobility;Decreased strength   Rehab Potential Good   PT Frequency 3x / week   PT Duration  8 weeks   PT Treatment/Interventions ADLs/Self Care Home Management;Therapeutic activities;Patient/family education;Moist Heat;Scar mobilization;Passive range of motion;DME Instruction;Therapeutic exercise;Gait training;Balance training;Manual techniques;Neuromuscular re-education;Stair training;Cryotherapy;Electrical Stimulation;Functional mobility training   Consulted and Agree with Plan of Care Patient        Problem List Patient Active Problem List   Diagnosis Date Noted  . OA (osteoarthritis) of knee 08/03/2014  . Coronary artery disease 07/10/2014  . Essential hypertension 07/10/2014  . Hyperlipidemia 07/10/2014   Kerin Perna, PTA 09/08/2014 7:58 AM  [facility] [place] [facility], [place], [address] Phone: [phone]   Fax:  [phone]

## 2014-09-08 DIAGNOSIS — Z471 Aftercare following joint replacement surgery: Secondary | ICD-10-CM | POA: Diagnosis not present

## 2014-09-08 DIAGNOSIS — Z96651 Presence of right artificial knee joint: Secondary | ICD-10-CM | POA: Diagnosis not present

## 2014-09-09 ENCOUNTER — Ambulatory Visit (INDEPENDENT_AMBULATORY_CARE_PROVIDER_SITE_OTHER): Payer: Medicare Other | Admitting: Physical Therapy

## 2014-09-09 DIAGNOSIS — R262 Difficulty in walking, not elsewhere classified: Secondary | ICD-10-CM | POA: Diagnosis not present

## 2014-09-09 DIAGNOSIS — R269 Unspecified abnormalities of gait and mobility: Secondary | ICD-10-CM | POA: Diagnosis not present

## 2014-09-09 DIAGNOSIS — M24661 Ankylosis, right knee: Secondary | ICD-10-CM | POA: Diagnosis present

## 2014-09-09 DIAGNOSIS — R29898 Other symptoms and signs involving the musculoskeletal system: Secondary | ICD-10-CM | POA: Diagnosis not present

## 2014-09-09 NOTE — Therapy (Signed)
[place], [place], [address] Phone: [phone]   Fax:  [phone]  Physical Therapy Treatment  Patient Details  Name: [name] Glass MRN: [id number] Date of Birth: 1940-12-21 Referring Provider:  [name], MD  Encounter Date: 09/09/2014      PT End of Session - 09/09/14 1512    Visit Number 10   Number of Visits 16   Date for PT Re-Evaluation 10/18/14   PT Start Time 1540   PT Stop Time 1526   PT Time Calculation (min) 53 min   Activity Tolerance Patient limited by pain;Patient tolerated treatment well   Behavior During Therapy [facility] for tasks assessed/performed      Past Medical History  Diagnosis Date  . GERD (gastroesophageal reflux disease)   . Coronary artery disease   . Hypothyroidism   . Sleep apnea     hx of, surgery to reverse  . H/O hiatal hernia   . Arthritis   . Hyperlipidemia     statin intolerant, under control  . Hypertension     under control  . H/O bronchitis   . History of kidney stones last in 1976    Past Surgical History  Procedure Laterality Date  . Uvulopalatopharyngoplasty  6 years ago  . Cystoscopy N/A 08/06/2012    Procedure: CYSTOSCOPY;  Surgeon: [name], MD;  [facility]: [facility];  Service: Urology;  Laterality: N/A;  . Transurethral resection of prostate N/A 08/06/2012    Procedure: FULGERATION OF PROSTATE WITH GYRUS ;  Surgeon: [name], MD;  Location: [facility];  Service: Urology;  Laterality: N/A;  . Cardiac catheterization Right 2006    5 heart stents  . Esophagogastric fundoplication  0867    spleen removed, 5 pints of blood given  . Tonsillectomy  as child  . Hernia repair  1985    x4  . Prostate ablation  2013    "laser ablation"  . Cataract extraction Bilateral 6 years ago  . 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  Visit Diagnosis:  Decreased range of motion of knee, right  Weakness of right lower extremity  Difficulty walking  Abnormality of gait      Subjective Assessment - 09/09/14 1441    Subjective Went to MD yesterday, overall pleased with progress but wants to focus on extension   Currently in Pain? Yes   Pain Score 4    Pain Location Knee   Pain Orientation Right   Pain Descriptors / Indicators Aching;Dull;Sore   Pain Frequency Intermittent            [facility] PT Assessment - 09/09/14 1603    Observation/Other Assessments   Focus on Therapeutic Outcomes (FOTO)  33 (67% limited                   [facility] Adult PT Treatment/Exercise - 09/09/14 1442    Knee/Hip Exercises: Aerobic   Stationary Bike x 8 min   Knee/Hip Exercises: Supine   Quad Sets Strengthening;Right;20 reps   Quad Sets Limitations with heel prop   Short Arc Quad Sets 20 reps;Right   Short Arc Quad Sets Limitations cues for knee ext   Knee/Hip Exercises: Prone   Other Prone Exercises TKE with Rt toes curled x 10  with 5 sec hold.    Moist Heat Therapy   Number Minutes Moist Heat 15 Minutes   Moist Heat Location Other (comment)  post r knee   Cryotherapy   Number Minutes Cryotherapy 15 Minutes   Cryotherapy Location Knee   Type of Cryotherapy Ice pack   Manual Therapy   Manual Therapy Edema management;Passive ROM   Edema Management STW to post knee   Passive ROM in prone for R knee extension with heel off mat                  PT Short Term Goals - 08/24/14 1511    PT SHORT TERM GOAL #1   Title independent with HEP (09/16/14)   Time 4   Period Weeks   Status On-going   PT SHORT TERM GOAL #2   Title improve R knee AROM 10-90 degrees for improved function (09/16/14)   Time 4   Period Weeks   Status On-going   PT SHORT TERM GOAL #3   Title ambulate > 39' with single point cane modified independent for improved  function (09/16/14)   Time 4   Period Weeks   Status Achieved   PT SHORT TERM GOAL #4   Title report ability to ambulate > 20 min with LRAD without increase in pain for improved funciton (09/16/14)   Time 4   Period Weeks   Status On-going           PT Long Term Goals - 08/19/14 1316    PT LONG TERM GOAL #1   Title independent with advanced HEP for home and community (10/14/14)   Time 8   Period Weeks   Status New   PT LONG TERM GOAL #2   Title improve R knee AROM 0-100 degrees for improved function and mobility (10/14/14)   Time 8   Period Weeks   Status New   PT LONG TERM GOAL #3   Title ambulate > 100' without a device for improved function and mobility (10/14/14)   Time 8   Period Weeks   Status New   PT LONG TERM GOAL #4   Title perform RLE SLS > 5 sec for improved strength and function (10/14/14)   Time 8   Period Weeks   Status New               Plan - 09/09/14 1513    Clinical Impression Statement Improved R knee ext ROM after manual.  Continues to be limited.  will continue to benefit from PT to maximize function.   PT Next Visit Plan agrressive ROM, STW to Rt hamstrings, hamstrings and gastroc as needed. Review ITB stretch attempt supine if sidelying too difficult.   Consulted and Agree with Plan of Care Patient        Problem List Patient Active Problem List   Diagnosis Date Noted  . OA (osteoarthritis) of knee 08/03/2014  . Coronary artery disease 07/10/2014  . Essential hypertension 07/10/2014  . Hyperlipidemia 07/10/2014   Laureen Abrahams, PT, DPT 09/09/2014 4:08 PM  [facility] [place], [place], [address] Phone: [phone]   Fax:  [phone]

## 2014-09-11 ENCOUNTER — Ambulatory Visit (INDEPENDENT_AMBULATORY_CARE_PROVIDER_SITE_OTHER): Payer: Medicare Other | Admitting: Physical Therapy

## 2014-09-11 DIAGNOSIS — R29898 Other symptoms and signs involving the musculoskeletal system: Secondary | ICD-10-CM | POA: Diagnosis not present

## 2014-09-11 DIAGNOSIS — M24661 Ankylosis, right knee: Secondary | ICD-10-CM

## 2014-09-11 DIAGNOSIS — R262 Difficulty in walking, not elsewhere classified: Secondary | ICD-10-CM

## 2014-09-11 NOTE — Therapy (Signed)
[place], [place], [address] Phone: [phone]   Fax:  [phone]  Physical Therapy Treatment  Patient Details  Name: [name] Glass MRN: [id number] Date of Birth: 1941/05/18 Referring Provider:  [name], MD  Encounter Date: 09/11/2014      PT End of Session - 09/11/14 1411    Visit Number 11   Number of Visits 16   Date for PT Re-Evaluation 10/18/14   PT Start Time 1406   PT Stop Time 6433   PT Time Calculation (min) 51 min   Activity Tolerance Patient tolerated treatment well   Behavior During Therapy [facility] for tasks assessed/performed      Past Medical History  Diagnosis Date  . GERD (gastroesophageal reflux disease)   . Coronary artery disease   . Hypothyroidism   . Sleep apnea     hx of, surgery to reverse  . H/O hiatal hernia   . Arthritis   . Hyperlipidemia     statin intolerant, under control  . Hypertension     under control  . H/O bronchitis   . History of kidney stones last in 1976    Past Surgical History  Procedure Laterality Date  . Uvulopalatopharyngoplasty  6 years ago  . Cystoscopy N/A 08/06/2012    Procedure: CYSTOSCOPY;  Surgeon: [name], MD;  Location: [facility];  Service: Urology;  Laterality: N/A;  . Transurethral resection of prostate N/A 08/06/2012    Procedure: FULGERATION OF PROSTATE WITH GYRUS ;  Surgeon: [name], MD;  Location: [facility];  Service: Urology;  Laterality: N/A;  . Cardiac catheterization Right 2006    5 heart stents  . Esophagogastric fundoplication  2951    spleen removed, 5 pints of blood given  . Tonsillectomy  as child  . Hernia repair  1985    x4  . Prostate ablation  2013    "laser ablation"  . Cataract extraction Bilateral 6 years ago  . 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  Visit Diagnosis:  Decreased range of motion of knee, right  Weakness of right lower extremity  Difficulty walking                     [facility] Adult PT Treatment/Exercise - 09/11/14 0001    Exercises   Exercises Knee/Hip   Knee/Hip Exercises: Stretches   Gastroc Stretch 2 reps;30 seconds   Knee/Hip Exercises: Aerobic   Stationary Bike NuStep L5 x 5 min   Knee/Hip Exercises: Standing   Heel Raises 10 reps;2 sets  toes straight, toes out   Side Lunges 10 reps;2 sets   Side Lunges Limitations UE support on counter    Terminal Knee Extension Right  pushing ball into wall.5 sec hold x 12 reps    Lateral Step Up 15 reps;Right;1 set;Hand Hold: 1;Step Height: 8"   Forward Step Up 15 reps;Right;1 set;Step Height: 8";Hand Hold: 1   SLS 3 sets of 15 sec on blue foam with light UE support.    Moist Heat Therapy   Number Minutes Moist Heat 15 Minutes   Moist Heat Location Other (comment)  post r knee   Cryotherapy   Number Minutes Cryotherapy 15 Minutes   Cryotherapy Location Knee anterior Rt.  Type of Cryotherapy Ice pack   Knee/Hip Exercises: Machines for Strengthening   Cybex Leg Press 9 plates x 10, 10 plates x 10                 PT Education - 09/11/14 1433    Education provided Yes   Education Details HEP   Person(s) Educated Patient   Methods Explanation;Handout   Comprehension Verbalized understanding;Returned demonstration          PT Short Term Goals - 08/24/14 1511    PT SHORT TERM GOAL #1   Title independent with HEP (09/16/14)   Time 4   Period Weeks   Status On-going   PT SHORT TERM GOAL #2   Title improve R knee AROM 10-90 degrees for improved function (09/16/14)   Time 4   Period Weeks   Status On-going   PT SHORT TERM GOAL #3   Title ambulate > 23' with single point cane modified independent for improved function (09/16/14)   Time 4   Period Weeks   Status Achieved   PT SHORT  TERM GOAL #4   Title report ability to ambulate > 20 min with LRAD without increase in pain for improved funciton (09/16/14)   Time 4   Period Weeks   Status On-going           PT Long Term Goals - 08/19/14 1316    PT LONG TERM GOAL #1   Title independent with advanced HEP for home and community (10/14/14)   Time 8   Period Weeks   Status New   PT LONG TERM GOAL #2   Title improve R knee AROM 0-100 degrees for improved function and mobility (10/14/14)   Time 8   Period Weeks   Status New   PT LONG TERM GOAL #3   Title ambulate > 100' without a device for improved function and mobility (10/14/14)   Time 8   Period Weeks   Status New   PT LONG TERM GOAL #4   Title perform RLE SLS > 5 sec for improved strength and function (10/14/14)   Time 8   Period Weeks   Status New               Plan - 09/11/14 1629    Clinical Impression Statement Pt reported decreased stiffness in R knee and ease with ambulation after ther ex, especially with gastroc stretches. Pt making gains towards all goals.    Pt will benefit from skilled therapeutic intervention in order to improve on the following deficits Abnormal gait;Decreased range of motion;Difficulty walking;Impaired flexibility;Pain;Increased edema;Decreased activity tolerance;Decreased scar mobility;Decreased balance;Decreased knowledge of use of DME;Decreased mobility;Decreased strength   Rehab Potential Good   PT Frequency 3x / week   PT Duration 8 weeks   PT Treatment/Interventions ADLs/Self Care Home Management;Therapeutic activities;Patient/family education;Moist Heat;Scar mobilization;Passive range of motion;DME Instruction;Therapeutic exercise;Gait training;Balance training;Manual techniques;Neuromuscular re-education;Stair training;Cryotherapy;Electrical Stimulation;Functional mobility training   PT Next Visit Plan agrressive ROM, STW to Rt hamstrings, hamstrings and gastroc as needed. Progress HEP.    Consulted and Agree with  Plan of Care Patient        Problem List Patient Active Problem List   Diagnosis Date Noted  . OA (osteoarthritis) of knee 08/03/2014  . Coronary artery disease 07/10/2014  . Essential hypertension 07/10/2014  . Hyperlipidemia 07/10/2014   Kerin Perna, PTA 09/11/2014 4:33 PM  [place], [place], [address] Phone: [phone]  Fax:  [phone]

## 2014-09-11 NOTE — Patient Instructions (Signed)
Calf / Gastoc: Runners' Stretch I   One leg back and straight, other forward and bent supporting weight, lean forward, gently stretching calf of back leg. Hold __30__ seconds. Repeat with other leg. Repeat __2__ times. Do __2__ sessions per day.  Copyright  VHI. All rights reserved.   [facility] [place], [place] [address]  [phone] (office) [phone] (fax)

## 2014-09-14 ENCOUNTER — Ambulatory Visit (INDEPENDENT_AMBULATORY_CARE_PROVIDER_SITE_OTHER): Payer: Medicare Other | Admitting: Physical Therapy

## 2014-09-14 DIAGNOSIS — M24661 Ankylosis, right knee: Secondary | ICD-10-CM | POA: Diagnosis present

## 2014-09-14 DIAGNOSIS — R29898 Other symptoms and signs involving the musculoskeletal system: Secondary | ICD-10-CM

## 2014-09-14 DIAGNOSIS — R262 Difficulty in walking, not elsewhere classified: Secondary | ICD-10-CM

## 2014-09-14 DIAGNOSIS — R269 Unspecified abnormalities of gait and mobility: Secondary | ICD-10-CM

## 2014-09-15 NOTE — Therapy (Signed)
[place] [facility], [place], [address] Phone: [phone]   Fax:  [phone]  Physical Therapy Treatment  Patient Details  Name: [name] Glass MRN: [id number] Date of Birth: 01/29/1941 Referring Provider:  [name], MD  Encounter Date: 09/14/2014      PT End of Session - 09/14/14 1409    Visit Number 12   Number of Visits 16   Date for PT Re-Evaluation 10/18/14   PT Start Time 1401   PT Stop Time 1452   PT Time Calculation (min) 51 min      Past Medical History  Diagnosis Date  . GERD (gastroesophageal reflux disease)   . Coronary artery disease   . Hypothyroidism   . Sleep apnea     hx of, surgery to reverse  . H/O hiatal hernia   . Arthritis   . Hyperlipidemia     statin intolerant, under control  . Hypertension     under control  . H/O bronchitis   . History of kidney stones last in 1976    Past Surgical History  Procedure Laterality Date  . Uvulopalatopharyngoplasty  6 years ago  . Cystoscopy N/A 08/06/2012    Procedure: CYSTOSCOPY;  Surgeon: [name], MD;  Location: [facility];  Service: Urology;  Laterality: N/A;  . Transurethral resection of prostate N/A 08/06/2012    Procedure: FULGERATION OF PROSTATE WITH GYRUS ;  Surgeon: [name], MD;  Location: [facility];  Service: Urology;  Laterality: N/A;  . Cardiac catheterization Right 2006    5 heart stents  . Esophagogastric fundoplication  5364    spleen removed, 5 pints of blood given  . Tonsillectomy  as child  . Hernia repair  1985    x4  . Prostate ablation  2013    "laser ablation"  . Cataract extraction Bilateral 6 years ago  . 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  Visit Diagnosis:  Decreased range of motion of knee, right  Weakness  of right lower extremity  Difficulty walking  Abnormality of gait      Subjective Assessment - 09/14/14 1411    Subjective Pt c/o difficulty finding comfortable position to sleep. Voiced frustration with continued lack of knee extension.    Pain Score 3    Pain Location Knee   Pain Orientation Right   Pain Descriptors / Indicators Sharp;Aching   Aggravating Factors  exercises.    Pain Relieving Factors medicine/ ice/ bending it.       PT treatment:   NuStep Level 5, 5 min TKE with ball pressed into wall x 10 sec hold x 10 reps  Lateral step ups 6" step x 15 reps RLE Forward step ups 6: step x 15 reps RLE  Forward lunges on 13" step with 10 sec hold x 15 reps (to increase ROM) Gastroc stretch 30 sec x 3 reps bilat Soleus stretch 30 sec x 3 reps bilat [place] to lateral posterior Rt knee/distal ITB  @ 50%, 3.3 mHz, 1.3 w/cm2  X 8 min for pain Estim to Rt knee - IFC to tolerance. X 15 min. Goal: pain relief  MHP to posterior Rt knee and ice pack to ant right knee x 15 min with leg elevated.  PT Short Term Goals - 08/24/14 1511    PT SHORT TERM GOAL #1   Title independent with HEP (09/16/14)   Time 4   Period Weeks   Status On-going   PT SHORT TERM GOAL #2   Title improve R knee AROM 10-90 degrees for improved function (09/16/14)   Time 4   Period Weeks   Status On-going   PT SHORT TERM GOAL #3   Title ambulate > 86' with single point cane modified independent for improved function (09/16/14)   Time 4   Period Weeks   Status Achieved   PT SHORT TERM GOAL #4   Title report ability to ambulate > 20 min with LRAD without increase in pain for improved funciton (09/16/14)   Time 4   Period Weeks   Status On-going           PT Long Term Goals - 08/19/14 1316    PT LONG TERM GOAL #1   Title independent with advanced HEP for home and community (10/14/14)   Time 8   Period Weeks   Status New   PT LONG TERM GOAL #2   Title improve R knee AROM 0-100 degrees for  improved function and mobility (10/14/14)   Time 8   Period Weeks   Status New   PT LONG TERM GOAL #3   Title ambulate > 100' without a device for improved function and mobility (10/14/14)   Time 8   Period Weeks   Status New   PT LONG TERM GOAL #4   Title perform RLE SLS > 5 sec for improved strength and function (10/14/14)   Time 8   Period Weeks   Status New               Plan - 09/14/14 1603    Clinical Impression Statement Pt reported decreased stiffness and ease of ambulation after gastroc stretch, [place] and heat to posterior knee.  Pt continues with decreased Rt knee ROM, especially extension.    Pt will benefit from skilled therapeutic intervention in order to improve on the following deficits Abnormal gait;Decreased range of motion;Difficulty walking;Impaired flexibility;Pain;Increased edema;Decreased activity tolerance;Decreased scar mobility;Decreased balance;Decreased knowledge of use of DME;Decreased mobility;Decreased strength   Rehab Potential Good   PT Frequency 3x / week   PT Duration 8 weeks   PT Treatment/Interventions ADLs/Self Care Home Management;Therapeutic activities;Patient/family education;Moist Heat;Scar mobilization;Passive range of motion;DME Instruction;Therapeutic exercise;Gait training;Balance training;Manual techniques;Neuromuscular re-education;Stair training;Cryotherapy;Electrical Stimulation;Functional mobility training   PT Next Visit Plan agrressive ROM, STW to Rt hamstrings, hamstrings and gastroc as needed. Progress HEP.         Problem List Patient Active Problem List   Diagnosis Date Noted  . OA (osteoarthritis) of knee 08/03/2014  . Coronary artery disease 07/10/2014  . Essential hypertension 07/10/2014  . Hyperlipidemia 07/10/2014   Kerin Perna, PTA 09/15/2014 1:08 PM  [facility] [place], [place], [address] Phone: [phone]   Fax:   [phone]

## 2014-09-17 ENCOUNTER — Encounter: Payer: BLUE CROSS/BLUE SHIELD | Admitting: Physical Therapy

## 2014-09-17 ENCOUNTER — Ambulatory Visit (INDEPENDENT_AMBULATORY_CARE_PROVIDER_SITE_OTHER): Payer: Medicare Other | Admitting: Physical Therapy

## 2014-09-17 DIAGNOSIS — R262 Difficulty in walking, not elsewhere classified: Secondary | ICD-10-CM

## 2014-09-17 DIAGNOSIS — R29898 Other symptoms and signs involving the musculoskeletal system: Secondary | ICD-10-CM

## 2014-09-17 DIAGNOSIS — H6501 Acute serous otitis media, right ear: Secondary | ICD-10-CM | POA: Diagnosis not present

## 2014-09-17 DIAGNOSIS — H6122 Impacted cerumen, left ear: Secondary | ICD-10-CM | POA: Diagnosis not present

## 2014-09-17 DIAGNOSIS — M24661 Ankylosis, right knee: Secondary | ICD-10-CM | POA: Diagnosis present

## 2014-09-17 DIAGNOSIS — R269 Unspecified abnormalities of gait and mobility: Secondary | ICD-10-CM

## 2014-09-17 DIAGNOSIS — H902 Conductive hearing loss, unspecified: Secondary | ICD-10-CM | POA: Diagnosis not present

## 2014-09-17 NOTE — Therapy (Signed)
[place], [place], [address] Phone: [phone]   Fax:  [phone]  Physical Therapy Treatment  Patient Details  Name: [name] Glass MRN: [id number] Date of Birth: March 17, 1941 Referring Provider:  [name], MD  Encounter Date: 09/17/2014      PT End of Session - 09/17/14 1623    Visit Number 13   Number of Visits 16   Date for PT Re-Evaluation 10/18/14   PT Start Time 1740   PT Stop Time 1636   PT Time Calculation (min) 62 min      Past Medical History  Diagnosis Date  . GERD (gastroesophageal reflux disease)   . Coronary artery disease   . Hypothyroidism   . Sleep apnea     hx of, surgery to reverse  . H/O hiatal hernia   . Arthritis   . Hyperlipidemia     statin intolerant, under control  . Hypertension     under control  . H/O bronchitis   . History of kidney stones last in 1976    Past Surgical History  Procedure Laterality Date  . Uvulopalatopharyngoplasty  6 years ago  . Cystoscopy N/A 08/06/2012    Procedure: CYSTOSCOPY;  Surgeon: [name], MD;  Location: [facility];  Service: Urology;  Laterality: N/A;  . Transurethral resection of prostate N/A 08/06/2012    Procedure: FULGERATION OF PROSTATE WITH GYRUS ;  Surgeon: [name], MD;  Location: [facility];  Service: Urology;  Laterality: N/A;  . Cardiac catheterization Right 2006    5 heart stents  . Esophagogastric fundoplication  8144    spleen removed, 5 pints of blood given  . Tonsillectomy  as child  . Hernia repair  1985    x4  . Prostate ablation  2013    "laser ablation"  . Cataract extraction Bilateral 6 years ago  . 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  Visit Diagnosis:  Decreased range of motion of knee, right  Weakness  of right lower extremity  Difficulty walking  Abnormality of gait      Subjective Assessment - 09/17/14 1539    Subjective Nothing new to report.    Currently in Pain? Yes   Pain Score 3    Pain Location Knee   Pain Orientation Right            [facility] PT Assessment - 09/17/14 0001    AROM   AROM Assessment Site Knee   Right/Left Knee Right   Right Knee Extension -11   Right Knee Flexion 114                   [facility] Adult PT Treatment/Exercise - 09/17/14 0001    Knee/Hip Exercises: Stretches   Gastroc Stretch 2 reps;30 seconds   Knee/Hip Exercises: Aerobic   Stationary Bike L2 x 5 min    Knee/Hip Exercises: Standing   Heel Raises 2 sets;10 reps   Lateral Step Up 15 reps;Right;1 set;Hand Hold: 1;Step Height: 8"   Lateral Step Up Limitations Pt reported increased ITB pain with this activity   Forward Step Up Right;1 set;10 reps  onto Bosu   Other Standing Knee Exercises forward lunge with foot on 13" step with 5 sec hold followed by hamstring stretch x  15 reps.    Knee/Hip Exercises: Supine   Quad Sets 1 set;Right;5 reps   Modalities   Modalities Cryotherapy;Electrical Stimulation;Moist Heat;Ultrasound   Moist Heat Therapy   Number Minutes Moist Heat 15 Minutes   Moist Heat Location --  posterior Rt knee    Cryotherapy   Number Minutes Cryotherapy 15 Minutes   Cryotherapy Location Knee  anterior Rt knee    Type of Cryotherapy Ice pack   Electrical Stimulation   Electrical Stimulation Location Rt knee   Electrical Stimulation Action ion repelling/ high volt    Electrical Stimulation Parameters to tolerance    Electrical Stimulation Goals Edema;Pain   Ultrasound   Ultrasound Location lateral Rt knee, distal ITB    Ultrasound Parameters 50%, 1.3 w/cm 2, 3.3 mHz    Ultrasound Goals Edema;Pain   Manual Therapy   Manual Therapy Other (comment);Joint mobilization   Joint Mobilization jt mob to Rt knee into extension with traction at Rt heel   Other  Manual Therapy muscle stripping and cross fiber to distal lateral hamstring and ITB                   PT Short Term Goals - 08/24/14 1511    PT SHORT TERM GOAL #1   Title independent with HEP (09/16/14)   Time 4   Period Weeks   Status On-going   PT SHORT TERM GOAL #2   Title improve R knee AROM 10-90 degrees for improved function (09/16/14)   Time 4   Period Weeks   Status On-going   PT SHORT TERM GOAL #3   Title ambulate > 64' with single point cane modified independent for improved function (09/16/14)   Time 4   Period Weeks   Status Achieved   PT SHORT TERM GOAL #4   Title report ability to ambulate > 20 min with LRAD without increase in pain for improved funciton (09/16/14)   Time 4   Period Weeks   Status On-going           PT Long Term Goals - 08/19/14 1316    PT LONG TERM GOAL #1   Title independent with advanced HEP for home and community (10/14/14)   Time 8   Period Weeks   Status New   PT LONG TERM GOAL #2   Title improve R knee AROM 0-100 degrees for improved function and mobility (10/14/14)   Time 8   Period Weeks   Status New   PT LONG TERM GOAL #3   Title ambulate > 100' without a device for improved function and mobility (10/14/14)   Time 8   Period Weeks   Status New   PT LONG TERM GOAL #4   Title perform RLE SLS > 5 sec for improved strength and function (10/14/14)   Time 8   Period Weeks   Status New               Plan - 09/17/14 1809    Clinical Impression Statement Pt reported some pain relief and improved Rt knee motion after treatment. Pt making slow gains with Rt knee ROM.  Pt able to tolerate most exercises with minimal increase in symptoms.    Pt will benefit from skilled therapeutic intervention in order to improve on the following deficits Abnormal gait;Decreased range of motion;Difficulty walking;Impaired flexibility;Pain;Increased edema;Decreased activity tolerance;Decreased scar mobility;Decreased balance;Decreased  knowledge of use of DME;Decreased mobility;Decreased strength   Rehab Potential Good   PT Frequency 3x / week  PT Duration 8 weeks   PT Treatment/Interventions ADLs/Self Care Home Management;Therapeutic activities;Patient/family education;Moist Heat;Scar mobilization;Passive range of motion;DME Instruction;Therapeutic exercise;Gait training;Balance training;Manual techniques;Neuromuscular re-education;Stair training;Cryotherapy;Electrical Stimulation;Functional mobility training   PT Next Visit Plan agrressive ROM, STW to Rt hamstrings, hamstrings and gastroc as needed. Progress HEP.    Consulted and Agree with Plan of Care Patient        Problem List Patient Active Problem List   Diagnosis Date Noted  . OA (osteoarthritis) of knee 08/03/2014  . Coronary artery disease 07/10/2014  . Essential hypertension 07/10/2014  . Hyperlipidemia 07/10/2014    Kerin Perna, PTA 09/17/2014 [facility] [place], [place], [address] Phone: [phone]   Fax:  [phone]

## 2014-09-21 ENCOUNTER — Ambulatory Visit (INDEPENDENT_AMBULATORY_CARE_PROVIDER_SITE_OTHER): Payer: Medicare Other | Admitting: Physical Therapy

## 2014-09-21 DIAGNOSIS — R269 Unspecified abnormalities of gait and mobility: Secondary | ICD-10-CM

## 2014-09-21 DIAGNOSIS — M24661 Ankylosis, right knee: Secondary | ICD-10-CM | POA: Diagnosis present

## 2014-09-21 DIAGNOSIS — R262 Difficulty in walking, not elsewhere classified: Secondary | ICD-10-CM

## 2014-09-21 DIAGNOSIS — R29898 Other symptoms and signs involving the musculoskeletal system: Secondary | ICD-10-CM | POA: Diagnosis not present

## 2014-09-21 NOTE — Therapy (Signed)
[place], [place], [address] Phone: [phone]   Fax:  [phone]  Physical Therapy Treatment  Patient Details  Name: [name] Glass MRN: [id number] Date of Birth: 02-26-1941 Referring Provider:  [name], MD  Encounter Date: 09/21/2014      PT End of Session - 09/21/14 1432    Visit Number 14   Number of Visits 22   Date for PT Re-Evaluation 10/18/14   PT Start Time 2355   PT Stop Time 1546   PT Time Calculation (min) 70 min   Activity Tolerance Patient tolerated treatment well      Past Medical History  Diagnosis Date  . GERD (gastroesophageal reflux disease)   . Coronary artery disease   . Hypothyroidism   . Sleep apnea     hx of, surgery to reverse  . H/O hiatal hernia   . Arthritis   . Hyperlipidemia     statin intolerant, under control  . Hypertension     under control  . H/O bronchitis   . History of kidney stones last in 1976    Past Surgical History  Procedure Laterality Date  . Uvulopalatopharyngoplasty  6 years ago  . Cystoscopy N/A 08/06/2012    Procedure: CYSTOSCOPY;  Surgeon: [name], MD;  Location: [facility];  Service: Urology;  Laterality: N/A;  . Transurethral resection of prostate N/A 08/06/2012    Procedure: FULGERATION OF PROSTATE WITH GYRUS ;  Surgeon: [name], MD;  Location: [facility];  Service: Urology;  Laterality: N/A;  . Cardiac catheterization Right 2006    5 heart stents  . Esophagogastric fundoplication  7322    spleen removed, 5 pints of blood given  . Tonsillectomy  as child  . Hernia repair  1985    x4  . Prostate ablation  2013    "laser ablation"  . Cataract extraction Bilateral 6 years ago  . 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  Visit  Diagnosis:  Decreased range of motion of knee, right  Weakness of right lower extremity  Abnormality of gait      Subjective Assessment - 09/21/14 1438    Subjective I've already done 30 minutes on my bike at home, also my stretches.  It finally feels like it may be getting better. HAs to go down to [place] this weekend for a funeral.    Currently in Pain? Yes   Pain Score 2    Pain Location Knee   Pain Orientation Right   Pain Descriptors / Indicators Aching   Pain Type Surgical pain   Pain Onset More than a month ago   Pain Frequency Intermittent   Aggravating Factors  prolonged exericise   Pain Relieving Factors medicine            [facility] PT Assessment - 09/21/14 0001    Assessment   Medical Diagnosis R TKA   Onset Date 08/03/14   AROM   AROM Assessment Site Knee   Right/Left Knee Right   Right Knee Extension -9   Right Knee Flexion 115                     [facility] Adult PT Treatment/Exercise - 09/21/14 0001    Knee/Hip Exercises: Aerobic  Stationary Bike Nustep L4x6   Knee/Hip Exercises: Machines for Strengthening   Cybex Knee Extension 3 plates, 3x10 bilat   Cybex Leg Press 11 plates, 3x10 bilat, seat on 5   Knee/Hip Exercises: Standing   SLS at the rebounder    Knee/Hip Exercises: Prone   Hamstring Curl 15 reps   Modalities   Modalities Cryotherapy;Electrical Stimulation;Moist Heat;Ultrasound   Moist Heat Therapy   Number Minutes Moist Heat 15 Minutes   Moist Heat Location --  posterior Rt knee   Cryotherapy   Number Minutes Cryotherapy 15 Minutes   Cryotherapy Location Knee  anterior   Type of Cryotherapy Ice pack   Electrical Stimulation   Electrical Stimulation Location Rt knee   Electrical Stimulation Action ion repelling   Electrical Stimulation Parameters to tolerance   Electrical Stimulation Goals Edema;Pain   Ultrasound   Ultrasound Location distal Rt hamstring and posterior knee   Ultrasound Parameters 100% 3.65mz, 1.2w/cm2    Ultrasound Goals Pain   Manual Therapy   Myofascial Release STW to posterior Rt knee and distal hamstrings.                   PT Short Term Goals - 09/21/14 1440    PT SHORT TERM GOAL #1   Title independent with HEP (09/16/14)   Status Achieved   PT SHORT TERM GOAL #2   Title improve R knee AROM 10-90 degrees for improved function (09/16/14)   Status Achieved   PT SHORT TERM GOAL #3   Title ambulate > 50' with single point cane modified independent for improved function (09/16/14)   Status Achieved   PT SHORT TERM GOAL #4   Title report ability to ambulate > 20 min with LRAD without increase in pain for improved funciton (09/16/14)   Status Achieved           PT Long Term Goals - 09/21/14 1441    PT LONG TERM GOAL #1   Title independent with advanced HEP for home and community (10/14/14)   Status Achieved   PT LONG TERM GOAL #2   Title improve R knee AROM 0-100 degrees for improved function and mobility (10/14/14)   Status Partially Met   PT LONG TERM GOAL #3   Title ambulate > 100' without a device for improved function and mobility (10/14/14)   Status Achieved   PT LONG TERM GOAL #4   Title perform RLE SLS > 5 sec for improved strength and function (10/14/14)  performed for 8 sec., then 23 sec.    Status Achieved   PT LONG TERM GOAL #5   Title get in/out of his corvettes per his baseline   Time 4   Period Weeks   Status New   Additional Long Term Goals   Additional Long Term Goals Yes   PT LONG TERM GOAL #6   Title improve FOTO =/< CK level   Time 4   Period Weeks   Status New               Plan - 09/21/14 1537    Clinical Impression Statement Pt is making small improvements in ROM every week.  He is working hard at home.  Will be out of town later this week, encouraged to continue his work and not let up.    Pt will benefit from skilled therapeutic intervention in order to improve on the following deficits Abnormal gait;Decreased range of  motion;Difficulty walking;Impaired flexibility;Pain;Increased edema;Decreased activity tolerance;Decreased scar mobility;Decreased balance;Decreased knowledge  of use of DME;Decreased mobility;Decreased strength   Rehab Potential Good   PT Frequency 2x / week   PT Duration 4 weeks   PT Treatment/Interventions ADLs/Self Care Home Management;Therapeutic activities;Patient/family education;Moist Heat;Scar mobilization;Passive range of motion;DME Instruction;Therapeutic exercise;Gait training;Balance training;Manual techniques;Neuromuscular re-education;Stair training;Cryotherapy;Electrical Stimulation;Functional mobility training   PT Next Visit Plan agrressive ROM, STW to Rt hamstrings, hamstrings and gastroc as needed. Progress HEP.    Consulted and Agree with Plan of Care Patient        Problem List Patient Active Problem List   Diagnosis Date Noted  . OA (osteoarthritis) of knee 08/03/2014  . Coronary artery disease 07/10/2014  . Essential hypertension 07/10/2014  . Hyperlipidemia 07/10/2014    Jeral Pinch, PT 09/21/2014, 3:40 PM  [facility] [place], [place], [address] Phone: [phone]   Fax:  [phone]

## 2014-09-24 ENCOUNTER — Encounter: Payer: BLUE CROSS/BLUE SHIELD | Admitting: Physical Therapy

## 2014-09-25 ENCOUNTER — Telehealth: Payer: Self-pay | Admitting: Pharmacist Clinician (PhC)/ Clinical Pharmacy Specialist

## 2014-09-25 NOTE — Telephone Encounter (Signed)
Spoke with patient, doing well with [name] Glass, [place] and [place].   Patient will contact [facility] for history of past statin use.  Once we have that information will send in request for Repatha

## 2014-09-28 ENCOUNTER — Ambulatory Visit (INDEPENDENT_AMBULATORY_CARE_PROVIDER_SITE_OTHER): Payer: Medicare Other | Admitting: Physical Therapy

## 2014-09-28 DIAGNOSIS — M7989 Other specified soft tissue disorders: Secondary | ICD-10-CM | POA: Diagnosis not present

## 2014-09-28 DIAGNOSIS — M24661 Ankylosis, right knee: Secondary | ICD-10-CM

## 2014-09-28 DIAGNOSIS — R29898 Other symptoms and signs involving the musculoskeletal system: Secondary | ICD-10-CM

## 2014-09-28 NOTE — Therapy (Signed)
[place], [place], [address] Phone: [phone]   Fax:  [phone]  Physical Therapy Treatment  Patient Details  Name: [name] Glass MRN: [id number] Date of Birth: Jan 10, 1941 Referring Provider:  [name], MD  Encounter Date: 09/28/2014      PT End of Session - 09/28/14 1432    Visit Number 15   Number of Visits 22   Date for PT Re-Evaluation 10/18/14   PT Start Time 7253   PT Stop Time 1544   PT Time Calculation (min) 72 min   Activity Tolerance Patient tolerated treatment well   Behavior During Therapy [facility] for tasks assessed/performed      Past Medical History  Diagnosis Date  . GERD (gastroesophageal reflux disease)   . Coronary artery disease   . Hypothyroidism   . Sleep apnea     hx of, surgery to reverse  . H/O hiatal hernia   . Arthritis   . Hyperlipidemia     statin intolerant, under control  . Hypertension     under control  . H/O bronchitis   . History of kidney stones last in 1976    Past Surgical History  Procedure Laterality Date  . Uvulopalatopharyngoplasty  6 years ago  . Cystoscopy N/A 08/06/2012    Procedure: CYSTOSCOPY;  Surgeon: [name], MD;  Location: [facility];  Service: Urology;  Laterality: N/A;  . Transurethral resection of prostate N/A 08/06/2012    Procedure: FULGERATION OF PROSTATE WITH GYRUS ;  Surgeon: [name], MD;  Location: [facility];  Service: Urology;  Laterality: N/A;  . Cardiac catheterization Right 2006    5 heart stents  . Esophagogastric fundoplication  6644    spleen removed, 5 pints of blood given  . Tonsillectomy  as child  . Hernia repair  1985    x4  . Prostate ablation  2013    "laser ablation"  . Cataract extraction Bilateral 6 years ago  . 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  Visit Diagnosis:  Decreased range of motion of knee, right  Weakness of right lower extremity  Swelling of limb      Subjective Assessment - 09/28/14 1434    Subjective I have some increased swelling in my ankle. Sat on plane for two hours on Saturday and noticed it on Sunday.   Limitations Walking;Standing;Sitting;House hold activities   How long can you sit comfortably? depends on chair. If it's good 1-2 hours. Office chair or chairs that hurt mid thigh 15 min.   How long can you stand comfortably? 5-10 min   How long can you walk comfortably? Can walk 2000 feet unlevel surfaces (2 x up/down driveway)   Patient Stated Goals improve ROM R knee; walk without device, negotiate stairs   Currently in Pain? Yes   Pain Score 2    Pain Location Knee   Pain Orientation Right   Pain Descriptors / Indicators Aching   Pain Type Surgical pain   Pain Onset More than a month ago   Pain Frequency Intermittent   Aggravating Factors  prolonged sitting   Pain Relieving Factors meds            [facility] PT Assessment - 09/28/14 0001    Assessment  Medical Diagnosis R TKA   Onset Date 08/03/14   Tone   Assessment Location --   AROM   AROM Assessment Site Knee   Right/Left Knee Right   Right Knee Extension -9   Palpation   Palpation edema figure 8 ankle: R 57 cm, L 55 cm                     [facility] Adult PT Treatment/Exercise - 09/28/14 0001    Knee/Hip Exercises: Aerobic   Stationary Bike NuStep L4 x 6 min   Knee/Hip Exercises: Machines for Strengthening   Cybex Knee Extension 3 plates, 2x10 then 1x10 with 5 sec hold   Cybex Leg Press 11 plates 2x15   Knee/Hip Exercises: Standing   Lateral Step Up Right;5 reps;20 reps;Hand Hold: 1;Step Height: 8"   Lateral Step Up Limitations mild pain in ITB   Forward Step Up Right;20 reps;Hand Hold: 2;Other (comment)  on Bosu   SLS 5 trials 9 seconds was longest   Modalities    Modalities Cryotherapy;Electrical Stimulation;Ultrasound   Cryotherapy   Number Minutes Cryotherapy 15 Minutes   Cryotherapy Location Knee   Type of Cryotherapy Other (comment)  vaso med pressure 3 *   Electrical Stimulation   Electrical Stimulation Location Rt knee and ankle   Electrical Stimulation Action ion repelling   Electrical Stimulation Parameters to tolerance   Electrical Stimulation Goals Edema   Ultrasound   Ultrasound Location distal R lateral HS   Ultrasound Parameters 100% 3.3 mhz 1.2wcm2 x 8 min   Ultrasound Goals Pain   Manual Therapy   Manual Therapy Joint mobilization;Myofascial release   Joint Mobilization post jt mob to R knee in supine   Myofascial Release STW to R hamstrings with active knee flex and TPR   Other Manual Therapy prone passive stretch into extension                  PT Short Term Goals - 09/21/14 1440    PT SHORT TERM GOAL #1   Title independent with HEP (09/16/14)   Status Achieved   PT SHORT TERM GOAL #2   Title improve R knee AROM 10-90 degrees for improved function (09/16/14)   Status Achieved   PT SHORT TERM GOAL #3   Title ambulate > 50' with single point cane modified independent for improved function (09/16/14)   Status Achieved   PT SHORT TERM GOAL #4   Title report ability to ambulate > 20 min with LRAD without increase in pain for improved funciton (09/16/14)   Status Achieved           PT Long Term Goals - 09/28/14 1554    PT LONG TERM GOAL #1   Title independent with advanced HEP for home and community (10/14/14)   Time 8   Period Weeks   Status Achieved   PT LONG TERM GOAL #2   Title improve R knee AROM 0-100 degrees for improved function and mobility (10/14/14)   Time 8   Period Weeks   Status Partially Met   PT LONG TERM GOAL #3   Title ambulate > 100' without a device for improved function and mobility (10/14/14)   Time 8   Period Weeks   Status Achieved   PT LONG TERM GOAL #4   Title perform RLE SLS >  5 sec for improved strength and function (10/14/14)   Time 8   Period Weeks   Status Achieved   PT LONG TERM  GOAL #5   Title get in/out of his corvettes per his baseline   Time 4   Period Weeks   Status New   PT LONG TERM GOAL #6   Title improve FOTO =/< CK level   Time 4   Period Weeks   Status New               Plan - 09/28/14 1440    Clinical Impression Statement Patient presents with increased edema in the RLE. Pitting edema at knee joint and R ankle. Skin is tight and shiny with 1/2"" eschar at med joint line. Pt reports he was massaging the skin on the airplane and the skin broke open.  Patient reports decreased hypersensitivity of lateral knee, and continues to report  numbness of R heel.  He has marked tenderness of right hamstrings with active TPs throughout. No change in ROM since last assessment.   Pt will benefit from skilled therapeutic intervention in order to improve on the following deficits Abnormal gait;Decreased range of motion;Difficulty walking;Impaired flexibility;Pain;Increased edema;Decreased activity tolerance;Decreased scar mobility;Decreased balance;Decreased knowledge of use of DME;Decreased mobility;Decreased strength   Rehab Potential Good   PT Frequency 2x / week   PT Duration 4 weeks   PT Treatment/Interventions ADLs/Self Care Home Management;Therapeutic activities;Patient/family education;Moist Heat;Scar mobilization;Passive range of motion;DME Instruction;Therapeutic exercise;Gait training;Balance training;Manual techniques;Neuromuscular re-education;Stair training;Cryotherapy;Electrical Stimulation;Functional mobility training   PT Next Visit Plan Continue agressive ROM, STW to hamstrings, edema management.   Consulted and Agree with Plan of Care Patient        Problem List Patient Active Problem List   Diagnosis Date Noted  . OA (osteoarthritis) of knee 08/03/2014  . Coronary artery disease 07/10/2014  . Essential hypertension 07/10/2014   . Hyperlipidemia 07/10/2014    Madelyn Flavors PT 09/28/2014, 3:58 PM  [facility] [place], [place], [address] Phone: [phone]   Fax:  [phone]

## 2014-10-05 ENCOUNTER — Encounter: Payer: BLUE CROSS/BLUE SHIELD | Admitting: Physical Therapy

## 2014-10-07 ENCOUNTER — Encounter: Payer: BLUE CROSS/BLUE SHIELD | Admitting: Physical Therapy

## 2014-10-08 ENCOUNTER — Encounter: Payer: BLUE CROSS/BLUE SHIELD | Admitting: Physical Therapy

## 2014-10-09 ENCOUNTER — Encounter: Payer: Self-pay | Admitting: Cardiovascular Disease

## 2014-10-12 ENCOUNTER — Ambulatory Visit (INDEPENDENT_AMBULATORY_CARE_PROVIDER_SITE_OTHER): Payer: Medicare Other | Admitting: Physical Therapy

## 2014-10-12 DIAGNOSIS — R269 Unspecified abnormalities of gait and mobility: Secondary | ICD-10-CM

## 2014-10-12 DIAGNOSIS — R262 Difficulty in walking, not elsewhere classified: Secondary | ICD-10-CM | POA: Diagnosis not present

## 2014-10-12 DIAGNOSIS — M24661 Ankylosis, right knee: Secondary | ICD-10-CM | POA: Diagnosis present

## 2014-10-12 DIAGNOSIS — R29898 Other symptoms and signs involving the musculoskeletal system: Secondary | ICD-10-CM | POA: Diagnosis not present

## 2014-10-12 DIAGNOSIS — M7989 Other specified soft tissue disorders: Secondary | ICD-10-CM

## 2014-10-12 NOTE — Therapy (Signed)
[place], [place], [address] Phone: [phone]   Fax:  [phone]  Physical Therapy Treatment  Patient Details  Name: [name] Glass MRN: [id number] Date of Birth: 1940-07-27 Referring Provider:  [name], MD  Encounter Date: 10/12/2014      PT End of Session - 10/12/14 1445    Visit Number 16   Number of Visits 22   Date for PT Re-Evaluation 10/18/14   PT Start Time 3254   PT Stop Time 1526   PT Time Calculation (min) 47 min   Activity Tolerance Patient limited by pain      Past Medical History  Diagnosis Date  . GERD (gastroesophageal reflux disease)   . Coronary artery disease   . Hypothyroidism   . Sleep apnea     hx of, surgery to reverse  . H/O hiatal hernia   . Arthritis   . Hyperlipidemia     statin intolerant, under control  . Hypertension     under control  . H/O bronchitis   . History of kidney stones last in 1976    Past Surgical History  Procedure Laterality Date  . Uvulopalatopharyngoplasty  6 years ago  . Cystoscopy N/A 08/06/2012    Procedure: CYSTOSCOPY;  Surgeon: [name], MD;  Location: [facility];  Service: Urology;  Laterality: N/A;  . Transurethral resection of prostate N/A 08/06/2012    Procedure: FULGERATION OF PROSTATE WITH GYRUS ;  Surgeon: [name], MD;  Location: [facility];  Service: Urology;  Laterality: N/A;  . Cardiac catheterization Right 2006    5 heart stents  . Esophagogastric fundoplication  9826    spleen removed, 5 pints of blood given  . Tonsillectomy  as child  . Hernia repair  1985    x4  . Prostate ablation  2013    "laser ablation"  . Cataract extraction Bilateral 6 years ago  . 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  Visit Diagnosis:   Decreased range of motion of knee, right  Weakness of right lower extremity  Swelling of limb  Abnormality of gait  Difficulty walking      Subjective Assessment - 10/12/14 1627    Subjective Pt voiced frustration with Rt knee lack of extension and continued swelling from thigh to ankle. Pt reports he has been compliant with HEP. Pt reports pain continues to wake him in night .    Currently in Pain? Yes   Pain Score 1    Pain Location Knee   Pain Orientation Right   Pain Descriptors / Indicators Aching   Aggravating Factors  prolonged positions    Pain Relieving Factors ice, massage, stretches            [facility] PT Assessment - 10/12/14 0001    Assessment   Medical Diagnosis R TKA   Onset Date 08/03/14   Next MD Visit 10/20/14   Observation/Other Assessments   Focus on Therapeutic Outcomes (FOTO)  47 (53% limitation)   AROM   AROM Assessment Site Knee   Right/Left Knee Right   Right Knee Extension -11   Right Knee Flexion 115   Strength   Right Hip Flexion 5/5   Right Hip Extension 5/5   Right Hip ABduction 5/5  Right Knee Flexion 5/5   Right Knee Extension 5/5                     [facility] Adult PT Treatment/Exercise - 10/12/14 0001    Knee/Hip Exercises: Stretches   Passive Hamstring Stretch 2 reps;30 seconds   Gastroc Stretch 2 reps;30 seconds   Soleus Stretch 2 reps;30 seconds   Knee/Hip Exercises: Standing   Heel Raises 3 sets;10 reps  toes in, out, and straight   Forward Lunges 5 reps;Right  on 13" step    Knee/Hip Exercises: Supine   Quad Sets 5 reps   Heel Slides Right;5 reps  for ROM measurement   Cryotherapy   Number Minutes Cryotherapy 15 Minutes   Cryotherapy Location Knee   Type of Cryotherapy --  vaso, med, 3*   Acupuncturist Stimulation Location Rt knee    Electrical Stimulation Action ion repelling    Electrical Stimulation Parameters to tolerance; 15 min    Electrical Stimulation Goals Edema;Pain    Ultrasound   Ultrasound Location distal Rt hamstring while RLE propped up in supine    Ultrasound Parameters 100%, 1.0 mH, 1.1 w/cm2 x 8 min    Ultrasound Goals Pain   Manual Therapy   Manual Therapy Myofascial release;Joint mobilization   Joint Mobilization post jt mob to R knee in supine, patellar mobs in all directions    Myofascial Release STW to posterior Rt knee and distal hamstrings.                   PT Short Term Goals - 09/21/14 1440    PT SHORT TERM GOAL #1   Title independent with HEP (09/16/14)   Status Achieved   PT SHORT TERM GOAL #2   Title improve R knee AROM 10-90 degrees for improved function (09/16/14)   Status Achieved   PT SHORT TERM GOAL #3   Title ambulate > 50' with single point cane modified independent for improved function (09/16/14)   Status Achieved   PT SHORT TERM GOAL #4   Title report ability to ambulate > 20 min with LRAD without increase in pain for improved funciton (09/16/14)   Status Achieved           PT Long Term Goals - 09/28/14 1554    PT LONG TERM GOAL #1   Title independent with advanced HEP for home and community (10/14/14)   Time 8   Period Weeks   Status Achieved   PT LONG TERM GOAL #2   Title improve R knee AROM 0-100 degrees for improved function and mobility (10/14/14)   Time 8   Period Weeks   Status Partially Met   PT LONG TERM GOAL #3   Title ambulate > 100' without a device for improved function and mobility (10/14/14)   Time 8   Period Weeks   Status Achieved   PT LONG TERM GOAL #4   Title perform RLE SLS > 5 sec for improved strength and function (10/14/14)   Time 8   Period Weeks   Status Achieved   PT LONG TERM GOAL #5   Title get in/out of his corvettes per his baseline   Time 4   Period Weeks   Status New   PT LONG TERM GOAL #6   Title improve FOTO =/< CK level   Time 4   Period Weeks   Status New  Plan - 10/12/14 1646    Clinical Impression Statement Pt presents with  continued edema in RLE; skin tight and shiny around knee and proximal tibia.  Pt measured with decrease in Rt knee extension compared to last visit.    Pt will benefit from skilled therapeutic intervention in order to improve on the following deficits Abnormal gait;Decreased range of motion;Difficulty walking;Impaired flexibility;Pain;Increased edema;Decreased activity tolerance;Decreased scar mobility;Decreased balance;Decreased knowledge of use of DME;Decreased mobility;Decreased strength   Rehab Potential Good   PT Frequency 2x / week   PT Duration 4 weeks   PT Treatment/Interventions ADLs/Self Care Home Management;Therapeutic activities;Patient/family education;Moist Heat;Scar mobilization;Passive range of motion;DME Instruction;Therapeutic exercise;Gait training;Balance training;Manual techniques;Neuromuscular re-education;Stair training;Cryotherapy;Electrical Stimulation;Functional mobility training   PT Next Visit Plan Continue agressive ROM, STW to hamstrings, edema management.   Consulted and Agree with Plan of Care Patient        Problem List Patient Active Problem List   Diagnosis Date Noted  . OA (osteoarthritis) of knee 08/03/2014  . Coronary artery disease 07/10/2014  . Essential hypertension 07/10/2014  . Hyperlipidemia 07/10/2014   Kerin Perna, PTA 10/12/2014 4:50 PM  [place], [place], [address] Phone: [phone]   Fax:  [phone]

## 2014-10-19 ENCOUNTER — Ambulatory Visit (INDEPENDENT_AMBULATORY_CARE_PROVIDER_SITE_OTHER): Payer: Medicare Other | Admitting: Physical Therapy

## 2014-10-19 DIAGNOSIS — R29898 Other symptoms and signs involving the musculoskeletal system: Secondary | ICD-10-CM

## 2014-10-19 DIAGNOSIS — M7989 Other specified soft tissue disorders: Secondary | ICD-10-CM | POA: Diagnosis not present

## 2014-10-19 DIAGNOSIS — R269 Unspecified abnormalities of gait and mobility: Secondary | ICD-10-CM

## 2014-10-19 DIAGNOSIS — M24661 Ankylosis, right knee: Secondary | ICD-10-CM

## 2014-10-19 DIAGNOSIS — R262 Difficulty in walking, not elsewhere classified: Secondary | ICD-10-CM | POA: Diagnosis not present

## 2014-10-19 NOTE — Therapy (Signed)
[place], [place], [address] Phone: [phone]   Fax:  [phone]  Physical Therapy Treatment  Patient Details  Name: [name] Glass MRN: [id number] Date of Birth: 1941-05-04 Referring Provider:  [name], MD  Encounter Date: 10/19/2014      PT End of Session - 10/19/14 1440    Visit Number 17   Number of Visits 25   Date for PT Re-Evaluation 11/16/14   PT Start Time 5364   PT Stop Time 1534   PT Time Calculation (min) 55 min      Past Medical History  Diagnosis Date  . GERD (gastroesophageal reflux disease)   . Coronary artery disease   . Hypothyroidism   . Sleep apnea     hx of, surgery to reverse  . H/O hiatal hernia   . Arthritis   . Hyperlipidemia     statin intolerant, under control  . Hypertension     under control  . H/O bronchitis   . History of kidney stones last in 1976    Past Surgical History  Procedure Laterality Date  . Uvulopalatopharyngoplasty  6 years ago  . Cystoscopy N/A 08/06/2012    Procedure: CYSTOSCOPY;  Surgeon: [name], MD;  Location: [facility];  Service: Urology;  Laterality: N/A;  . Transurethral resection of prostate N/A 08/06/2012    Procedure: FULGERATION OF PROSTATE WITH GYRUS ;  Surgeon: [name], MD;  Location: [facility];  Service: Urology;  Laterality: N/A;  . Cardiac catheterization Right 2006    5 heart stents  . Esophagogastric fundoplication  6803    spleen removed, 5 pints of blood given  . Tonsillectomy  as child  . Hernia repair  1985    x4  . Prostate ablation  2013    "laser ablation"  . Cataract extraction Bilateral 6 years ago  . 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  Visit Diagnosis:  Decreased range of motion of knee, right - Plan: PT  plan of care cert/re-cert  Weakness of right lower extremity - Plan: PT plan of care cert/re-cert  Swelling of limb - Plan: PT plan of care cert/re-cert  Abnormality of gait - Plan: PT plan of care cert/re-cert  Difficulty walking - Plan: PT plan of care cert/re-cert      Subjective Assessment - 10/19/14 1441    Subjective Pt has nothing new to report. [place], felt fatigued- not as easy as it use to be.    Patient Stated Goals full Rt knee extension, walk without a limp   Currently in Pain? Yes   Pain Score 1   up to 6/10 with exercise/prolonged extension    Pain Location Knee   Pain Orientation Right   Pain Descriptors / Indicators Aching   Aggravating Factors  prolonged positions   Pain Relieving Factors ice, massage, stretches.             [facility] PT Assessment - 10/19/14 0001    Assessment   Medical Diagnosis R TKA   Onset Date 08/03/14   Next MD Visit 10/20/14   Observation/Other Assessments   Focus on Therapeutic Outcomes (FOTO)  47 (53% limitation)   AROM   AROM Assessment Site Knee   Right/Left Knee  Right   Right Knee Extension -11   Right Knee Flexion 120   Strength   Right Hip Flexion 5/5   Right Hip Extension --  5-/5   Right Hip ABduction 5/5   Right Knee Flexion 5/5   Right Knee Extension 5/5                     [facility] Adult PT Treatment/Exercise - 10/19/14 0001    Knee/Hip Exercises: Stretches   Passive Hamstring Stretch 2 reps;60 seconds   Gastroc Stretch 2 reps;60 seconds   Knee/Hip Exercises: Aerobic   Stationary Bike L1 x 5 min    Knee/Hip Exercises: Machines for Strengthening   Cybex Knee Extension 3 plates, 2 x 10    Cybex Leg Press 8 plates with RLE 2 sets of 10    Knee/Hip Exercises: Standing   Forward Lunges 5 reps;Right   SLS 3 trials- up to 15 sec RLE    Other Standing Knee Exercises forward lunge with foot on 13" step holding 30 sec x 8 reps    Knee/Hip Exercises: Prone   Hamstring Curl 15 reps   5# at ankle   Modalities   Modalities Cryotherapy;Electrical Stimulation   Cryotherapy   Number Minutes Cryotherapy 15 Minutes   Cryotherapy Location Knee   Type of Cryotherapy --  vaso, med pressure, 3*    Electrical Stimulation   Electrical Stimulation Location Rt knee    Electrical Stimulation Action ion repelling    Electrical Stimulation Parameters to tolerance, x 15 min    Electrical Stimulation Goals Pain                  PT Short Term Goals - 09/21/14 1440    PT SHORT TERM GOAL #1   Title independent with HEP (09/16/14)   Status Achieved   PT SHORT TERM GOAL #2   Title improve R knee AROM 10-90 degrees for improved function (09/16/14)   Status Achieved   PT SHORT TERM GOAL #3   Title ambulate > 50' with single point cane modified independent for improved function (09/16/14)   Status Achieved   PT SHORT TERM GOAL #4   Title report ability to ambulate > 20 min with LRAD without increase in pain for improved funciton (09/16/14)   Status Achieved           PT Long Term Goals - 10/19/14 1541    PT LONG TERM GOAL #1   Title independent with advanced HEP for home and community (10/14/14)   Time 8   Period Weeks   Status Achieved   PT LONG TERM GOAL #2   Title improve R knee AROM 0-100 degrees for improved function and mobility (10/14/14)   Time 8   Period Weeks   Status Partially Met   PT LONG TERM GOAL #3   Title ambulate > 100' without a device for improved function and mobility (10/14/14)   Time 8   Period Weeks   Status Achieved   PT LONG TERM GOAL #4   Title perform RLE SLS > 5 sec for improved strength and function (10/14/14)   Period Weeks   Status Achieved   PT LONG TERM GOAL #5   Title get in/out of his corvettes per his baseline   Time 4   Period Weeks   Status On-going   PT LONG TERM GOAL #6   Title improve FOTO =/< CK level   Time 4   Period Weeks  Status Achieved  CK met, however goal was 43% and pt scored 53% limited.                 Plan - 10/19/14 1544    Clinical Impression Statement Pt demonstrated improved R knee flexion, but knee extension still lacks 11 degrees. Pt has improved Rt knee strength.  Pt interested in continuation of therapy to increase Rt knee ROM.    Pt will benefit from skilled therapeutic intervention in order to improve on the following deficits Abnormal gait;Decreased range of motion;Difficulty walking;Impaired flexibility;Pain;Increased edema;Decreased activity tolerance;Decreased scar mobility;Decreased balance;Decreased knowledge of use of DME;Decreased mobility;Decreased strength   PT Treatment/Interventions ADLs/Self Care Home Management;Therapeutic activities;Patient/family education;Moist Heat;Scar mobilization;Passive range of motion;DME Instruction;Therapeutic exercise;Gait training;Balance training;Manual techniques;Neuromuscular re-education;Stair training;Cryotherapy;Electrical Stimulation;Functional mobility training   PT Next Visit Plan spoke to supervising PT regarding pt's progress and desire to continue therapy.    Consulted and Agree with Plan of Care Patient        Problem List Patient Active Problem List   Diagnosis Date Noted  . OA (osteoarthritis) of knee 08/03/2014  . Coronary artery disease 07/10/2014  . Essential hypertension 07/10/2014  . Hyperlipidemia 07/10/2014   Kerin Perna, PTA 10/19/2014 4:51 PM   [place], [place], [address] Phone: [phone]   Fax:  [place], PT 10/19/2014 4:51 PM

## 2014-10-20 DIAGNOSIS — Z96651 Presence of right artificial knee joint: Secondary | ICD-10-CM | POA: Diagnosis not present

## 2014-10-20 DIAGNOSIS — Z471 Aftercare following joint replacement surgery: Secondary | ICD-10-CM | POA: Diagnosis not present

## 2014-10-22 ENCOUNTER — Ambulatory Visit (INDEPENDENT_AMBULATORY_CARE_PROVIDER_SITE_OTHER): Payer: Medicare Other | Admitting: Physical Therapy

## 2014-10-22 DIAGNOSIS — M7989 Other specified soft tissue disorders: Secondary | ICD-10-CM

## 2014-10-22 DIAGNOSIS — R269 Unspecified abnormalities of gait and mobility: Secondary | ICD-10-CM

## 2014-10-22 DIAGNOSIS — R29898 Other symptoms and signs involving the musculoskeletal system: Secondary | ICD-10-CM

## 2014-10-22 DIAGNOSIS — M24661 Ankylosis, right knee: Secondary | ICD-10-CM

## 2014-10-22 DIAGNOSIS — R262 Difficulty in walking, not elsewhere classified: Secondary | ICD-10-CM

## 2014-10-22 NOTE — Therapy (Signed)
[facility] [address] [place], [place], [address] Phone: [phone]   Fax:  [phone]  Physical Therapy Treatment  Patient Details  Name: [name] Glass MRN: [id number] Date of Birth: November 11, 1940 Referring Provider:  [name], MD  Encounter Date: 10/22/2014  Pt came in today and met with [facility] brace representative.  Patient was measured for brace today.  Per patient, MD has informed him he does not need further therapy. Therefore, at patient's request no therapy session was performed today. He is satisfied with current level of function.  At this time patient will d/c to HEP.    Visit Diagnosis:  Decreased range of motion of knee, right  Weakness of right lower extremity  Swelling of limb  Abnormality of gait  Difficulty walking         PT Short Term Goals - 09/21/14 1440    PT SHORT TERM GOAL #1   Title independent with HEP (09/16/14)   Status Achieved   PT SHORT TERM GOAL #2   Title improve R knee AROM 10-90 degrees for improved function (09/16/14)   Status Achieved   PT SHORT TERM GOAL #3   Title ambulate > 50' with single point cane modified independent for improved function (09/16/14)   Status Achieved   PT SHORT TERM GOAL #4   Title report ability to ambulate > 20 min with LRAD without increase in pain for improved funciton (09/16/14)   Status Achieved           PT Long Term Goals - 10/22/14 1537    PT LONG TERM GOAL #1   Title independent with advanced HEP for home and community (10/14/14)   Time 8   Period Weeks   Status Achieved   PT LONG TERM GOAL #2   Title improve R knee AROM 0-100 degrees for improved function and mobility (10/14/14)   Time 8   Period Weeks   Status Partially Met   PT LONG TERM GOAL #3   Title ambulate > 100' without a device for improved function and mobility (10/14/14)   Time 8   Period Weeks   Status Achieved   PT LONG TERM GOAL #4   Title perform RLE SLS > 5  sec for improved strength and function (10/14/14)   Time 8   Period Weeks   Status Achieved   PT LONG TERM GOAL #5   Title get in/out of his corvettes per his baseline   Time 4   Period Weeks   Status Not Met   PT LONG TERM GOAL #6   Title improve FOTO =/< CK level   Time 4   Period Weeks   Status Achieved      Problem List Patient Active Problem List   Diagnosis Date Noted  . OA (osteoarthritis) of knee 08/03/2014  . Coronary artery disease 07/10/2014  . Essential hypertension 07/10/2014  . Hyperlipidemia 07/10/2014   Kerin Perna, PTA 10/22/2014 3:49 PM  [facility] [facility] [address] [place], [place], [address] Phone: [phone]   Fax:  [phone]   PHYSICAL THERAPY DISCHARGE SUMMARY  Visits from Start of Care: 17 Current functional level related to goals / functional outcomes: Able to perform most tasks   Remaining deficits: Limited knee extension   Education / Equipment: HEP Plan: Patient agrees to discharge.  Patient goals were partially met. Patient is being discharged due to being pleased with the current functional level. Pt to get a  jazz brace for home use????      Jeral Pinch, PT 10/22/2014 3:50 PM

## 2014-12-01 DIAGNOSIS — Z471 Aftercare following joint replacement surgery: Secondary | ICD-10-CM | POA: Diagnosis not present

## 2014-12-01 DIAGNOSIS — Z96651 Presence of right artificial knee joint: Secondary | ICD-10-CM | POA: Diagnosis not present

## 2014-12-16 DIAGNOSIS — A09 Infectious gastroenteritis and colitis, unspecified: Secondary | ICD-10-CM | POA: Diagnosis not present

## 2014-12-16 DIAGNOSIS — L570 Actinic keratosis: Secondary | ICD-10-CM | POA: Diagnosis not present

## 2014-12-16 DIAGNOSIS — L723 Sebaceous cyst: Secondary | ICD-10-CM | POA: Diagnosis not present

## 2014-12-18 DIAGNOSIS — A09 Infectious gastroenteritis and colitis, unspecified: Secondary | ICD-10-CM | POA: Diagnosis not present

## 2014-12-21 DIAGNOSIS — M545 Low back pain: Secondary | ICD-10-CM | POA: Diagnosis not present

## 2014-12-21 DIAGNOSIS — M9903 Segmental and somatic dysfunction of lumbar region: Secondary | ICD-10-CM | POA: Diagnosis not present

## 2015-03-29 DIAGNOSIS — K2270 Barrett's esophagus without dysplasia: Secondary | ICD-10-CM | POA: Diagnosis not present

## 2015-03-29 DIAGNOSIS — R1909 Other intra-abdominal and pelvic swelling, mass and lump: Secondary | ICD-10-CM | POA: Diagnosis not present

## 2015-03-29 DIAGNOSIS — K219 Gastro-esophageal reflux disease without esophagitis: Secondary | ICD-10-CM | POA: Diagnosis not present

## 2015-04-01 ENCOUNTER — Other Ambulatory Visit: Payer: Self-pay | Admitting: Family Medicine

## 2015-04-01 DIAGNOSIS — L723 Sebaceous cyst: Secondary | ICD-10-CM | POA: Diagnosis not present

## 2015-04-01 DIAGNOSIS — L720 Epidermal cyst: Secondary | ICD-10-CM | POA: Diagnosis not present

## 2015-04-01 DIAGNOSIS — L814 Other melanin hyperpigmentation: Secondary | ICD-10-CM | POA: Diagnosis not present

## 2015-04-12 DIAGNOSIS — H43393 Other vitreous opacities, bilateral: Secondary | ICD-10-CM | POA: Diagnosis not present

## 2015-04-12 DIAGNOSIS — Z8679 Personal history of other diseases of the circulatory system: Secondary | ICD-10-CM | POA: Diagnosis not present

## 2015-04-12 DIAGNOSIS — Z23 Encounter for immunization: Secondary | ICD-10-CM | POA: Diagnosis not present

## 2015-04-12 DIAGNOSIS — H43812 Vitreous degeneration, left eye: Secondary | ICD-10-CM | POA: Diagnosis not present

## 2015-04-12 DIAGNOSIS — Z01812 Encounter for preprocedural laboratory examination: Secondary | ICD-10-CM | POA: Diagnosis not present

## 2015-04-12 DIAGNOSIS — Z961 Presence of intraocular lens: Secondary | ICD-10-CM | POA: Diagnosis not present

## 2015-04-13 ENCOUNTER — Other Ambulatory Visit (HOSPITAL_BASED_OUTPATIENT_CLINIC_OR_DEPARTMENT_OTHER): Payer: Self-pay | Admitting: Family Medicine

## 2015-04-13 DIAGNOSIS — R1909 Other intra-abdominal and pelvic swelling, mass and lump: Secondary | ICD-10-CM

## 2015-04-14 ENCOUNTER — Encounter (HOSPITAL_BASED_OUTPATIENT_CLINIC_OR_DEPARTMENT_OTHER): Payer: Self-pay

## 2015-04-14 ENCOUNTER — Ambulatory Visit (HOSPITAL_BASED_OUTPATIENT_CLINIC_OR_DEPARTMENT_OTHER)
Admission: RE | Admit: 2015-04-14 | Discharge: 2015-04-14 | Disposition: A | Discharge status: Home / Self Care | Payer: Medicare Other | Source: Ambulatory Visit | Attending: Family Medicine | Admitting: Family Medicine

## 2015-04-14 DIAGNOSIS — R1909 Other intra-abdominal and pelvic swelling, mass and lump: Secondary | ICD-10-CM | POA: Insufficient documentation

## 2015-04-14 MED ORDER — IOHEXOL 300 MG/ML  SOLN
100.0000 mL | Freq: Once | INTRAMUSCULAR | Status: AC | PRN
  Administered 2015-04-14: 100 mL via INTRAVENOUS

## 2015-05-06 DIAGNOSIS — R972 Elevated prostate specific antigen [PSA]: Secondary | ICD-10-CM | POA: Diagnosis not present

## 2015-05-06 DIAGNOSIS — N401 Enlarged prostate with lower urinary tract symptoms: Secondary | ICD-10-CM | POA: Diagnosis not present

## 2015-05-06 DIAGNOSIS — R310 Gross hematuria: Secondary | ICD-10-CM | POA: Diagnosis not present

## 2015-05-06 DIAGNOSIS — N138 Other obstructive and reflux uropathy: Secondary | ICD-10-CM | POA: Diagnosis not present

## 2015-06-02 DIAGNOSIS — J209 Acute bronchitis, unspecified: Secondary | ICD-10-CM | POA: Diagnosis not present

## 2015-06-02 DIAGNOSIS — J0140 Acute pansinusitis, unspecified: Secondary | ICD-10-CM | POA: Diagnosis not present

## 2015-06-02 DIAGNOSIS — I10 Essential (primary) hypertension: Secondary | ICD-10-CM | POA: Diagnosis not present

## 2015-06-22 DIAGNOSIS — R05 Cough: Secondary | ICD-10-CM | POA: Diagnosis not present

## 2015-06-22 DIAGNOSIS — J0140 Acute pansinusitis, unspecified: Secondary | ICD-10-CM | POA: Diagnosis not present

## 2015-07-26 DIAGNOSIS — I10 Essential (primary) hypertension: Secondary | ICD-10-CM | POA: Diagnosis not present

## 2015-07-26 DIAGNOSIS — E7800 Pure hypercholesterolemia, unspecified: Secondary | ICD-10-CM | POA: Diagnosis not present

## 2015-07-26 DIAGNOSIS — Z125 Encounter for screening for malignant neoplasm of prostate: Secondary | ICD-10-CM | POA: Diagnosis not present

## 2015-07-28 DIAGNOSIS — A6000 Herpesviral infection of urogenital system, unspecified: Secondary | ICD-10-CM | POA: Diagnosis not present

## 2015-07-28 DIAGNOSIS — J329 Chronic sinusitis, unspecified: Secondary | ICD-10-CM | POA: Diagnosis not present

## 2015-07-28 DIAGNOSIS — K2270 Barrett's esophagus without dysplasia: Secondary | ICD-10-CM | POA: Diagnosis not present

## 2015-07-28 DIAGNOSIS — I10 Essential (primary) hypertension: Secondary | ICD-10-CM | POA: Diagnosis not present

## 2015-07-28 DIAGNOSIS — E7800 Pure hypercholesterolemia, unspecified: Secondary | ICD-10-CM | POA: Diagnosis not present

## 2015-07-28 DIAGNOSIS — N529 Male erectile dysfunction, unspecified: Secondary | ICD-10-CM | POA: Diagnosis not present

## 2015-07-28 DIAGNOSIS — R972 Elevated prostate specific antigen [PSA]: Secondary | ICD-10-CM | POA: Diagnosis not present

## 2015-07-28 DIAGNOSIS — M8580 Other specified disorders of bone density and structure, unspecified site: Secondary | ICD-10-CM | POA: Diagnosis not present

## 2015-07-28 DIAGNOSIS — I2510 Atherosclerotic heart disease of native coronary artery without angina pectoris: Secondary | ICD-10-CM | POA: Diagnosis not present

## 2015-07-28 DIAGNOSIS — Z86010 Personal history of colonic polyps: Secondary | ICD-10-CM | POA: Diagnosis not present

## 2015-07-28 DIAGNOSIS — J309 Allergic rhinitis, unspecified: Secondary | ICD-10-CM | POA: Diagnosis not present

## 2015-07-28 DIAGNOSIS — Z0001 Encounter for general adult medical examination with abnormal findings: Secondary | ICD-10-CM | POA: Diagnosis not present

## 2015-08-09 DIAGNOSIS — H6521 Chronic serous otitis media, right ear: Secondary | ICD-10-CM | POA: Diagnosis not present

## 2015-09-10 ENCOUNTER — Encounter: Payer: Self-pay | Admitting: Cardiovascular Disease

## 2015-09-10 ENCOUNTER — Ambulatory Visit (INDEPENDENT_AMBULATORY_CARE_PROVIDER_SITE_OTHER): Payer: Medicare Other | Admitting: Cardiovascular Disease

## 2015-09-10 VITALS — BP 114/68 | HR 62 | Ht 73.0 in | Wt 217.0 lb

## 2015-09-10 DIAGNOSIS — E785 Hyperlipidemia, unspecified: Secondary | ICD-10-CM

## 2015-09-10 DIAGNOSIS — I2583 Coronary atherosclerosis due to lipid rich plaque: Principal | ICD-10-CM

## 2015-09-10 DIAGNOSIS — I2510 Atherosclerotic heart disease of native coronary artery without angina pectoris: Secondary | ICD-10-CM | POA: Diagnosis not present

## 2015-09-10 DIAGNOSIS — I10 Essential (primary) hypertension: Secondary | ICD-10-CM | POA: Diagnosis not present

## 2015-09-10 NOTE — Assessment & Plan Note (Signed)
History of coronary artery disease status post circumflex and RCA stenting by myself 06/09/04. He had a Myoview stress test done for preoperative clearance before a right total knee replacement 07/16/14 which was low risk for nonischemic. He denies chest pain or shortness of breath.

## 2015-09-10 NOTE — Assessment & Plan Note (Signed)
History of hypertension blood pressure pressure today 114/60. He is on Architectural technologist and Micardis. Continued current meds at current dosing

## 2015-09-10 NOTE — Patient Instructions (Signed)

## 2015-09-10 NOTE — Progress Notes (Signed)
09/10/2015 [name] Glass   12/14/1940  [id number]  Primary Physician FRIED, Jaymes Graff, MD Primary Cardiologist: [name] MD Renae Gloss   HPI:  [name] is a 75 year old moderately overweight although fit-appearing divorced Caucasian male with no children who I last saw 07/10/14. He has a history of CAD status post circumflex and RCA stenting by myself 06/09/04. The palms include hypertension, hyperlipidemia and statin intolerance. He does drink Pinch blended scotch on a daily basis. He denies chest pain or shortness of breath. 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 He had a Myoview stress test performed 07/16/14 for preoperative clearance prior to a right total knee replacement which was performed by Dr. Ricki Glass 08/03/14. This was low risk and nonischemic.   Current Outpatient Prescriptions  Medication Sig Dispense Refill  . acetaminophen (TYLENOL) 650 MG CR tablet Take 1,300 mg by mouth at bedtime.    [name] acidophilus (RISAQUAD) CAPS capsule Take 1 capsule by mouth 2 (two) times daily.    [name] acyclovir (ZOVIRAX) 200 MG capsule Take 200-400 mg by mouth 2 (two) times daily.   0  . ezetimibe (ZETIA) 10 MG tablet Take 10 mg by mouth every evening.    . fluticasone (FLONASE) 50 MCG/ACT nasal spray Place 2 sprays into the nose daily.     . nisoldipine (SULAR) 17 MG 24 hr tablet Take 17 mg by mouth every morning.    [name] omeprazole (PRILOSEC) 20 MG capsule Take 20 mg by mouth 2 (two) times daily.     . potassium gluconate 595 MG TABS Take 595 mg by mouth every evening.    [name] telmisartan (MICARDIS) 80 MG tablet Take 80 mg by mouth every morning.     . testosterone cypionate (DEPOTESTOTERONE CYPIONATE) 100 MG/ML injection Inject into the muscle once a week. For IM use only    . thyroid (ARMOUR) 60 MG tablet Take 60-120 mg by mouth 2 (two) times daily.     . traMADol (ULTRAM) 50 MG tablet Take 1-2 tablets (50-100 mg  total) by mouth every 6 (six) hours as needed (mild pain). 80 tablet 1   No current facility-administered medications for this visit.    Allergies  Allergen Reactions  . Morphine And Related     Had "crazy dreams" felt "crazy" per pt.  . Bactrim [Sulfamethoxazole-Trimethoprim]   . Statins     Social History   Social History  . Marital Status: Divorced    Spouse Name: N/A  . Number of Children: N/A  . Years of Education: N/A   Occupational History  . Not on file.   Social History Main Topics  . Smoking status: Never Smoker   . Smokeless tobacco: Never Used  . Alcohol Use: 2.4 oz/week    4 Standard drinks or equivalent per week     Comment: 3 drinks 4 times a week  . Drug Use: No  . Sexual Activity: Not on file   Other Topics Concern  . Not on file   Social History Narrative     Review of Systems: General: negative for chills, fever, night sweats or weight changes.  Cardiovascular: negative for chest pain, dyspnea on exertion, edema, orthopnea, palpitations, paroxysmal nocturnal dyspnea or shortness of breath Dermatological: negative for rash Respiratory: negative for cough or wheezing Urologic: negative for hematuria Abdominal: negative for nausea, vomiting, diarrhea, bright red blood per rectum, melena, or hematemesis Neurologic: negative for visual changes,  syncope, or dizziness All other systems reviewed and are otherwise negative except as noted above.    Blood pressure 114/68, pulse 62, height 6\' 1"  (1.854 m), weight 217 lb (98.431 kg).  General appearance: alert and no distress Neck: no adenopathy, no carotid bruit, no JVD, supple, symmetrical, trachea midline and thyroid not enlarged, symmetric, no tenderness/mass/nodules Lungs: clear to auscultation bilaterally Heart: regular rate and rhythm, S1, S2 normal, no murmur, click, rub or gallop Extremities: extremities normal, atraumatic, no cyanosis or edema  EKG normal sinus rhythm at 62 with inferior Q  waves and anterior Q waves. I proceeded through this EKG  ASSESSMENT AND PLAN:   Coronary artery disease History of coronary artery disease status post circumflex and RCA stenting by myself 06/09/04. He had a Myoview stress test done for preoperative clearance before a right total knee replacement 07/16/14 which was low risk for nonischemic. He denies chest pain or shortness of breath.  Essential hypertension History of hypertension blood pressure pressure today 114/60. He is on Architectural technologist and Micardis. Continued current meds at current dosing  Hyperlipidemia History of hyperlipidemia on Zetia and intolerant to statin medications. This is followed by his PCP      [name] MD [facility], [facility] 09/10/2015 2:16 PM

## 2015-09-10 NOTE — Assessment & Plan Note (Signed)
History of hyperlipidemia on Zetia and intolerant to statin medications. This is followed by his PCP

## 2015-10-08 DIAGNOSIS — M1712 Unilateral primary osteoarthritis, left knee: Secondary | ICD-10-CM | POA: Diagnosis not present

## 2015-10-18 DIAGNOSIS — S99921A Unspecified injury of right foot, initial encounter: Secondary | ICD-10-CM | POA: Diagnosis not present

## 2015-11-02 DIAGNOSIS — J0140 Acute pansinusitis, unspecified: Secondary | ICD-10-CM | POA: Diagnosis not present

## 2015-11-03 ENCOUNTER — Telehealth: Payer: Self-pay

## 2015-11-03 NOTE — Telephone Encounter (Signed)
Wahid would like to become a patient of Dr.Jones. His doctor retired.  Dr. Ronnald Ramp is that ok with you?

## 2015-11-03 NOTE — Telephone Encounter (Signed)
yes

## 2015-11-03 NOTE — Telephone Encounter (Signed)
Patient has a app on June 19th.

## 2015-11-04 DIAGNOSIS — Z96651 Presence of right artificial knee joint: Secondary | ICD-10-CM | POA: Diagnosis not present

## 2015-11-04 DIAGNOSIS — Z471 Aftercare following joint replacement surgery: Secondary | ICD-10-CM | POA: Diagnosis not present

## 2015-11-11 DIAGNOSIS — J0141 Acute recurrent pansinusitis: Secondary | ICD-10-CM | POA: Diagnosis not present

## 2015-11-17 LAB — TSH: TSH: 0.62 u[IU]/mL (ref 0.41–5.90)

## 2015-11-17 LAB — CBC AND DIFFERENTIAL
HEMATOCRIT: 53 % (ref 41–53)
Hemoglobin: 17.4 g/dL (ref 13.5–17.5)
WBC: 5.1 10*3/mL

## 2015-11-17 LAB — LIPID PANEL
CHOLESTEROL: 168 mg/dL (ref 0–200)
HDL: 42 mg/dL (ref 35–70)
LDL Cholesterol: 100 mg/dL
TRIGLYCERIDES: 132 mg/dL (ref 40–160)

## 2015-11-17 LAB — BASIC METABOLIC PANEL
BUN: 19 mg/dL (ref 4–21)
Creatinine: 1 mg/dL (ref 0.6–1.3)
GLUCOSE: 100 mg/dL
Potassium: 5 mmol/L (ref 3.4–5.3)
SODIUM: 143 mmol/L (ref 137–147)

## 2015-11-17 LAB — PSA: PSA: 5.7

## 2015-11-22 ENCOUNTER — Ambulatory Visit: Payer: BLUE CROSS/BLUE SHIELD | Admitting: Internal Medicine

## 2015-12-16 ENCOUNTER — Encounter: Payer: Self-pay | Admitting: Internal Medicine

## 2015-12-16 ENCOUNTER — Ambulatory Visit (INDEPENDENT_AMBULATORY_CARE_PROVIDER_SITE_OTHER): Payer: Medicare Other | Admitting: Internal Medicine

## 2015-12-16 VITALS — BP 132/62 | HR 60 | Temp 97.9°F | Resp 16 | Ht 73.0 in | Wt 215.0 lb

## 2015-12-16 DIAGNOSIS — K219 Gastro-esophageal reflux disease without esophagitis: Secondary | ICD-10-CM

## 2015-12-16 DIAGNOSIS — Z23 Encounter for immunization: Secondary | ICD-10-CM

## 2015-12-16 DIAGNOSIS — I10 Essential (primary) hypertension: Secondary | ICD-10-CM | POA: Diagnosis not present

## 2015-12-16 DIAGNOSIS — E785 Hyperlipidemia, unspecified: Secondary | ICD-10-CM

## 2015-12-16 DIAGNOSIS — I2510 Atherosclerotic heart disease of native coronary artery without angina pectoris: Secondary | ICD-10-CM

## 2015-12-16 NOTE — Progress Notes (Signed)
Subjective:  Patient ID: [name] Glass, male    DOB: 1940/06/30  Age: 75 y.o. MRN: [id number]  CC: Hypertension; Hyperlipidemia; and Coronary Artery Disease   HPI JAISEN HARBECK presents for est a new PCP.  He has a medical history as listed above. He sees a Engineer, agricultural physician in [place] and receives multiple prescriptions for thyroid replacement therapy testosterone replacement therapy with Femara and progesterone, and multiple vitamins. He feels well today and offers no complaints.  He has coronary artery disease and tells me that he had stents placed several years ago. He has an LDL goal of less than 70 and he agrees to take Zetia but he is not willing to take a statin.  He has a history of PSA elevation and tells me that he has seen a urologist and has had multiple biopsies done in [place]. His PSA peak was 13 and most recently it was down to about 5.  He tells me his previous primary care physician performed a physical a few months ago so he does not want to have a physical performed today.  History [name] has a past medical history of GERD (gastroesophageal reflux disease); Coronary artery disease; Hypothyroidism; Sleep apnea; H/O hiatal hernia; Arthritis; Hyperlipidemia; Hypertension; H/O bronchitis; and History of kidney stones (last in 1976).   He has past surgical history that includes Uvulopalatopharyngoplasty (6 years ago); Cystoscopy (N/A, 08/06/2012); Transurethral resection of prostate (N/A, 08/06/2012); Cardiac catheterization (Right, 2008); Esophagogastric fundoplication (Q000111Q); Tonsillectomy (as child); Hernia repair (1985); Prostate ablation (2013); Cataract extraction (Bilateral, 6 years ago); Total knee arthroplasty (Right, 08/03/2014); Inguinal hernia repair (1992); Umbilical hernia repair (1990); and Uvulectomy (2005).   His family history includes Alzheimer's disease in his maternal grandmother and mother; Cancer in his maternal grandfather, maternal grandmother, mother,  paternal grandfather, and paternal grandmother; Diabetes in his paternal grandfather; Heart attack (age of onset: 2) in his father; Stroke in his mother.He reports that he has never smoked. He has never used smokeless tobacco. He reports that he drinks about 2.4 oz of alcohol per week. He reports that he does not use illicit drugs.  Outpatient Prescriptions Prior to Visit  Medication Sig Dispense Refill  . acidophilus (RISAQUAD) CAPS capsule Take 1 capsule by mouth 2 (two) times daily.    [name] acyclovir (ZOVIRAX) 200 MG capsule Take 200-400 mg by mouth 2 (two) times daily.   0  . ezetimibe (ZETIA) 10 MG tablet Take 10 mg by mouth every evening.    . fluticasone (FLONASE) 50 MCG/ACT nasal spray Place 2 sprays into the nose daily.     . potassium gluconate 595 MG TABS Take 595 mg by mouth every evening.    [name] telmisartan (MICARDIS) 80 MG tablet Take 80 mg by mouth every morning.     . testosterone cypionate (DEPOTESTOTERONE CYPIONATE) 100 MG/ML injection Inject into the muscle once a week. For IM use only    . thyroid (ARMOUR) 60 MG tablet Take 60-120 mg by mouth 2 (two) times daily.     [name] acetaminophen (TYLENOL) 650 MG CR tablet Take 1,300 mg by mouth at bedtime. Reported on 12/16/2015    . nisoldipine (SULAR) 17 MG 24 hr tablet Take 17 mg by mouth every morning. Reported on 12/16/2015    . omeprazole (PRILOSEC) 20 MG capsule Take 20 mg by mouth 2 (two) times daily.     . traMADol (ULTRAM) 50 MG tablet Take 1-2 tablets (50-100 mg total) by mouth every 6 (six) hours as needed (mild  pain). 80 tablet 1   No facility-administered medications prior to visit.    ROS Review of Systems  Constitutional: Negative.  Negative for diaphoresis, activity change, fatigue and unexpected weight change.  HENT: Negative.  Negative for sinus pressure and trouble swallowing.   Eyes: Negative.  Negative for visual disturbance.  Respiratory: Negative.  Negative for cough, choking, chest tightness, shortness of breath and  stridor.   Cardiovascular: Negative.  Negative for chest pain, palpitations and leg swelling.  Gastrointestinal: Negative.  Negative for nausea, vomiting, abdominal pain, diarrhea and constipation.  Endocrine: Negative.   Genitourinary: Negative.   Musculoskeletal: Negative.  Negative for myalgias, back pain, joint swelling, arthralgias and neck pain.  Skin: Negative.  Negative for color change.  Allergic/Immunologic: Negative.   Neurological: Negative.  Negative for dizziness, weakness and light-headedness.  Hematological: Negative.  Negative for adenopathy. Does not bruise/bleed easily.  Psychiatric/Behavioral: Negative.     Objective:  BP 132/62 mmHg  Pulse 60  Temp(Src) 97.9 F (36.6 C) (Oral)  Resp 16  Ht 6\' 1"  (1.854 m)  Wt 215 lb (97.523 kg)  BMI 28.37 kg/m2  SpO2 96%  Physical Exam  Constitutional: He is oriented to person, place, and time. No distress.  HENT:  Mouth/Throat: Oropharynx is clear and moist. No oropharyngeal exudate.  Eyes: Conjunctivae are normal. Right eye exhibits no discharge. Left eye exhibits no discharge. No scleral icterus.  Neck: Normal range of motion. Neck supple. No JVD present. No tracheal deviation present. No thyromegaly present.  Cardiovascular: Normal rate, regular rhythm, normal heart sounds and intact distal pulses.  Exam reveals no gallop and no friction rub.   No murmur heard. Pulmonary/Chest: Effort normal and breath sounds normal. No stridor. No respiratory distress. He has no wheezes. He has no rales. He exhibits no tenderness.  Abdominal: Soft. Bowel sounds are normal. He exhibits no distension and no mass. There is no tenderness. There is no rebound and no guarding.  Musculoskeletal: Normal range of motion. He exhibits no edema or tenderness.  Lymphadenopathy:    He has no cervical adenopathy.  Neurological: He is oriented to person, place, and time.  Skin: Skin is warm and dry. No rash noted. He is not diaphoretic. No erythema. No  pallor.  Vitals reviewed.   Lab Results  Component Value Date   WBC 5.1 11/17/2015   HGB 17.4 11/17/2015   HCT 53 11/17/2015   PLT 271 08/05/2014   GLUCOSE 109* 08/05/2014   CHOL 168 11/17/2015   TRIG 132 11/17/2015   HDL 42 11/17/2015   LDLCALC 100 11/17/2015   ALT 27 07/28/2014   AST 27 07/28/2014   NA 143 11/17/2015   K 5.0 11/17/2015   CL 99 08/05/2014   CREATININE 1.0 11/17/2015   BUN 19 11/17/2015   CO2 29 08/05/2014   TSH 0.62 11/17/2015   PSA 5.7 11/17/2015   INR 0.96 07/28/2014    Assessment & Plan:   Barrett was seen today for hypertension, hyperlipidemia and coronary artery disease.  Diagnoses and all orders for this visit:  Need for prophylactic vaccination against Streptococcus pneumoniae (pneumococcus) -     Pneumococcal polysaccharide vaccine 23-valent greater than or equal to 2yo subcutaneous/IM  Gastroesophageal reflux disease without esophagitis- his symptoms are well-controlled with the PPI Dexilant  Coronary artery disease involving native coronary artery of native heart without angina pectoris- he has had no recent episodes of angina, he will continue to follow closely with his cardiologist.  Essential hypertension, benign- his blood pressure  is well-controlled on the ARB. His recent electrolytes and renal function were normal.  Hyperlipidemia with target LDL less than 70- he has not achieved his LDL goal of 70, his recent LDL was 100, will continue Zetia but he reaffirmed with me today that he is not willing to take a statin.   I have discontinued Mr. Uren omeprazole and traMADol. I am also having him maintain his ezetimibe, fluticasone, potassium gluconate, thyroid, telmisartan, acyclovir, acidophilus, acetaminophen, nisoldipine, testosterone cypionate, DEXILANT, Plant Sterols and Stanols (CHOLEST OFF PO), and Nutritional Supplements (DHEA PO).  Meds ordered this encounter  Medications  . DEXILANT 60 MG capsule    Sig: Take 1 capsule by  mouth daily.    Refill:  1  . Plant Sterols and Stanols (CHOLEST OFF PO)    Sig: Take 900 mg by mouth.  . Nutritional Supplements (DHEA PO)    Sig: Take 25 mg by mouth.     Follow-up: No Follow-up on file.  Scarlette Calico, MD

## 2015-12-16 NOTE — Progress Notes (Signed)
Pre visit review using our clinic review tool, if applicable. No additional management support is needed unless otherwise documented below in the visit note. 

## 2015-12-19 ENCOUNTER — Encounter: Payer: Self-pay | Admitting: Internal Medicine

## 2015-12-19 DIAGNOSIS — I10 Essential (primary) hypertension: Secondary | ICD-10-CM | POA: Insufficient documentation

## 2015-12-19 DIAGNOSIS — K219 Gastro-esophageal reflux disease without esophagitis: Secondary | ICD-10-CM | POA: Insufficient documentation

## 2015-12-19 DIAGNOSIS — E785 Hyperlipidemia, unspecified: Secondary | ICD-10-CM | POA: Insufficient documentation

## 2015-12-19 NOTE — Patient Instructions (Signed)
Hypertension Hypertension, commonly called high blood pressure, is when the force of blood pumping through your arteries is too strong. Your arteries are the blood vessels that carry blood from your heart throughout your body. A blood pressure reading consists of a higher number over a lower number, such as 110/72. The higher number (systolic) is the pressure inside your arteries when your heart pumps. The lower number (diastolic) is the pressure inside your arteries when your heart relaxes. Ideally you want your blood pressure below 120/80. Hypertension forces your heart to work harder to pump blood. Your arteries may become narrow or stiff. Having untreated or uncontrolled hypertension can cause heart attack, stroke, kidney disease, and other problems. RISK FACTORS Some risk factors for high blood pressure are controllable. Others are not.  Risk factors you cannot control include:   Race. You may be at higher risk if you are African American.  Age. Risk increases with age.  Gender. Men are at higher risk than women before age 45 years. After age 65, women are at higher risk than men. Risk factors you can control include:  Not getting enough exercise or physical activity.  Being overweight.  Getting too much fat, sugar, calories, or salt in your diet.  Drinking too much alcohol. SIGNS AND SYMPTOMS Hypertension does not usually cause signs or symptoms. Extremely high blood pressure (hypertensive crisis) may cause headache, anxiety, shortness of breath, and nosebleed. DIAGNOSIS To check if you have hypertension, your health care provider will measure your blood pressure while you are seated, with your arm held at the level of your heart. It should be measured at least twice using the same arm. Certain conditions can cause a difference in blood pressure between your right and left arms. A blood pressure reading that is higher than normal on one occasion does not mean that you need treatment. If  it is not clear whether you have high blood pressure, you may be asked to return on a different day to have your blood pressure checked again. Or, you may be asked to monitor your blood pressure at home for 1 or more weeks. TREATMENT Treating high blood pressure includes making lifestyle changes and possibly taking medicine. Living a healthy lifestyle can help lower high blood pressure. You may need to change some of your habits. Lifestyle changes may include:  Following the DASH diet. This diet is high in fruits, vegetables, and whole grains. It is low in salt, red meat, and added sugars.  Keep your sodium intake below 2,300 mg per day.  Getting at least 30-45 minutes of aerobic exercise at least 4 times per week.  Losing weight if necessary.  Not smoking.  Limiting alcoholic beverages.  Learning ways to reduce stress. Your health care provider may prescribe medicine if lifestyle changes are not enough to get your blood pressure under control, and if one of the following is true:  You are 18-59 years of age and your systolic blood pressure is above 140.  You are 60 years of age or older, and your systolic blood pressure is above 150.  Your diastolic blood pressure is above 90.  You have diabetes, and your systolic blood pressure is over 140 or your diastolic blood pressure is over 90.  You have kidney disease and your blood pressure is above 140/90.  You have heart disease and your blood pressure is above 140/90. Your personal target blood pressure may vary depending on your medical conditions, your age, and other factors. HOME CARE INSTRUCTIONS    Have your blood pressure rechecked as directed by your health care provider.   Take medicines only as directed by your health care provider. Follow the directions carefully. Blood pressure medicines must be taken as prescribed. The medicine does not work as well when you skip doses. Skipping doses also puts you at risk for  problems.  Do not smoke.   Monitor your blood pressure at home as directed by your health care provider. SEEK MEDICAL CARE IF:   You think you are having a reaction to medicines taken.  You have recurrent headaches or feel dizzy.  You have swelling in your ankles.  You have trouble with your vision. SEEK IMMEDIATE MEDICAL CARE IF:  You develop a severe headache or confusion.  You have unusual weakness, numbness, or feel faint.  You have severe chest or abdominal pain.  You vomit repeatedly.  You have trouble breathing. MAKE SURE YOU:   Understand these instructions.  Will watch your condition.  Will get help right away if you are not doing well or get worse.   This information is not intended to replace advice given to you by your health care provider. Make sure you discuss any questions you have with your health care provider.   Document Released: 05/22/2005 Document Revised: 10/06/2014 Document Reviewed: 03/14/2013 Elsevier Interactive Patient Education 2016 [employer]  

## 2016-02-09 ENCOUNTER — Other Ambulatory Visit: Payer: Self-pay | Admitting: Internal Medicine

## 2016-02-21 ENCOUNTER — Other Ambulatory Visit: Payer: Self-pay | Admitting: Internal Medicine

## 2016-04-18 ENCOUNTER — Other Ambulatory Visit: Payer: Self-pay | Admitting: Dermatology

## 2016-04-18 DIAGNOSIS — D492 Neoplasm of unspecified behavior of bone, soft tissue, and skin: Secondary | ICD-10-CM | POA: Diagnosis not present

## 2016-04-18 DIAGNOSIS — L820 Inflamed seborrheic keratosis: Secondary | ICD-10-CM | POA: Diagnosis not present

## 2016-04-18 DIAGNOSIS — D0422 Carcinoma in situ of skin of left ear and external auricular canal: Secondary | ICD-10-CM | POA: Diagnosis not present

## 2016-04-18 DIAGNOSIS — C4492 Squamous cell carcinoma of skin, unspecified: Secondary | ICD-10-CM

## 2016-04-18 DIAGNOSIS — L570 Actinic keratosis: Secondary | ICD-10-CM | POA: Diagnosis not present

## 2016-04-18 HISTORY — DX: Squamous cell carcinoma of skin, unspecified: C44.92

## 2016-06-07 ENCOUNTER — Encounter: Payer: Self-pay | Admitting: Internal Medicine

## 2016-06-07 ENCOUNTER — Ambulatory Visit (INDEPENDENT_AMBULATORY_CARE_PROVIDER_SITE_OTHER)
Admission: RE | Admit: 2016-06-07 | Discharge: 2016-06-07 | Disposition: A | Discharge status: Home / Self Care | Payer: Medicare Other | Source: Ambulatory Visit | Attending: Internal Medicine | Admitting: Internal Medicine

## 2016-06-07 ENCOUNTER — Telehealth: Payer: Self-pay

## 2016-06-07 ENCOUNTER — Ambulatory Visit (INDEPENDENT_AMBULATORY_CARE_PROVIDER_SITE_OTHER): Payer: Medicare Other | Admitting: Internal Medicine

## 2016-06-07 ENCOUNTER — Other Ambulatory Visit (INDEPENDENT_AMBULATORY_CARE_PROVIDER_SITE_OTHER): Payer: Medicare Other

## 2016-06-07 VITALS — BP 110/50 | HR 56 | Temp 98.1°F | Resp 16 | Ht 73.0 in | Wt 220.5 lb

## 2016-06-07 DIAGNOSIS — I10 Essential (primary) hypertension: Secondary | ICD-10-CM

## 2016-06-07 DIAGNOSIS — R9431 Abnormal electrocardiogram [ECG] [EKG]: Secondary | ICD-10-CM | POA: Diagnosis not present

## 2016-06-07 DIAGNOSIS — R05 Cough: Secondary | ICD-10-CM | POA: Diagnosis not present

## 2016-06-07 DIAGNOSIS — R197 Diarrhea, unspecified: Secondary | ICD-10-CM | POA: Diagnosis not present

## 2016-06-07 DIAGNOSIS — R011 Cardiac murmur, unspecified: Secondary | ICD-10-CM | POA: Diagnosis not present

## 2016-06-07 DIAGNOSIS — J181 Lobar pneumonia, unspecified organism: Secondary | ICD-10-CM

## 2016-06-07 DIAGNOSIS — J189 Pneumonia, unspecified organism: Secondary | ICD-10-CM | POA: Insufficient documentation

## 2016-06-07 DIAGNOSIS — R059 Cough, unspecified: Secondary | ICD-10-CM

## 2016-06-07 LAB — CBC WITH DIFFERENTIAL/PLATELET
Basophils Absolute: 0 10*3/uL (ref 0.0–0.1)
Basophils Relative: 0.5 % (ref 0.0–3.0)
EOS PCT: 2.9 % (ref 0.0–5.0)
Eosinophils Absolute: 0.2 10*3/uL (ref 0.0–0.7)
HCT: 56.3 % — ABNORMAL HIGH (ref 39.0–52.0)
Hemoglobin: 18.8 g/dL (ref 13.0–17.0)
Lymphocytes Relative: 31.3 % (ref 12.0–46.0)
Lymphs Abs: 2.1 10*3/uL (ref 0.7–4.0)
MCHC: 33.5 g/dL (ref 30.0–36.0)
MCV: 97 fl (ref 78.0–100.0)
Monocytes Absolute: 1.2 10*3/uL — ABNORMAL HIGH (ref 0.1–1.0)
Monocytes Relative: 17.2 % — ABNORMAL HIGH (ref 3.0–12.0)
Neutro Abs: 3.2 10*3/uL (ref 1.4–7.7)
Neutrophils Relative %: 48.1 % (ref 43.0–77.0)
Platelets: 281 10*3/uL (ref 150.0–400.0)
RBC: 5.8 Mil/uL (ref 4.22–5.81)
RDW: 15.6 % — ABNORMAL HIGH (ref 11.5–15.5)
WBC: 6.8 10*3/uL (ref 4.0–10.5)

## 2016-06-07 LAB — COMPREHENSIVE METABOLIC PANEL
ALBUMIN: 4.3 g/dL (ref 3.5–5.2)
ALK PHOS: 49 U/L (ref 39–117)
ALT: 19 U/L (ref 0–53)
AST: 20 U/L (ref 0–37)
BUN: 34 mg/dL — ABNORMAL HIGH (ref 6–23)
CALCIUM: 10 mg/dL (ref 8.4–10.5)
CO2: 26 mEq/L (ref 19–32)
Chloride: 103 mEq/L (ref 96–112)
Creatinine, Ser: 1.63 mg/dL — ABNORMAL HIGH (ref 0.40–1.50)
GFR: 43.95 mL/min — ABNORMAL LOW (ref >60.00)
Glucose, Bld: 87 mg/dL (ref 70–99)
POTASSIUM: 4.6 meq/L (ref 3.5–5.1)
Sodium: 138 mEq/L (ref 135–145)
TOTAL PROTEIN: 7.2 g/dL (ref 6.0–8.3)
Total Bilirubin: 0.4 mg/dL (ref 0.2–1.2)

## 2016-06-07 LAB — CARDIAC PANEL
CK TOTAL: 186 U/L (ref 7–232)
CK-MB: 5.3 ng/mL — ABNORMAL HIGH (ref 0.3–4.0)
Relative Index: 2.8 calc — ABNORMAL HIGH (ref 0.0–2.5)

## 2016-06-07 LAB — TROPONIN I: TNIDX: 0.04 ug/l (ref 0.00–0.06)

## 2016-06-07 LAB — MAGNESIUM: MAGNESIUM: 2.2 mg/dL (ref 1.5–2.5)

## 2016-06-07 MED ORDER — CEFDINIR 300 MG PO CAPS: 300.0000 mg | ORAL_CAPSULE | Freq: Two times a day (BID) | ORAL | 0 refills | Status: DC

## 2016-06-07 MED ORDER — PROMETHAZINE-DM 6.25-15 MG/5ML PO SYRP: 5.0000 mL | ORAL_SOLUTION | Freq: Four times a day (QID) | ORAL | 1 refills | Status: DC | PRN

## 2016-06-07 NOTE — Progress Notes (Signed)
Subjective:  Patient ID: [name] Glass, male    DOB: [birth year]  Age: 76 y.o. MRN: [id number]  CC: Cough   HPI [name] Glass presents for an illness that started about 2 weeks ago. He was at a Christmas party and had a few alcoholic beverages and felt dizzy, weak, and lightheaded and went to bed. Over the ensuing days he developed a cough that was productive of thick yellow phlegm. He has had a few night sweats, low-grade fever, and chills. He has also had a few episodes of profuse watery diarrhea but denies abdominal pain, blood in the stool, or mucus in his stool. He's had no recent episodes of chest pain, diaphoresis, or shortness of breath.  Outpatient Medications Prior to Visit  Medication Sig Dispense Refill  . acetaminophen (TYLENOL) 650 MG CR tablet Take 1,300 mg by mouth at bedtime. Reported on 12/16/2015    . acidophilus (RISAQUAD) CAPS capsule Take 1 capsule by mouth 2 (two) times daily.    [name] acyclovir (ZOVIRAX) 200 MG capsule Take 200-400 mg by mouth 2 (two) times daily.   0  . DEXILANT 60 MG capsule Take 1 capsule by mouth daily.  1  . ezetimibe (ZETIA) 10 MG tablet Take 10 mg by mouth every evening.    . fluticasone (FLONASE) 50 MCG/ACT nasal spray Place 2 sprays into the nose daily.     . Nutritional Supplements (DHEA PO) Take 25 mg by mouth.    . Plant Sterols and Stanols (CHOLEST OFF PO) Take 900 mg by mouth.    . potassium gluconate 595 MG TABS Take 595 mg by mouth every evening.    . testosterone cypionate (DEPOTESTOTERONE CYPIONATE) 100 MG/ML injection Inject into the muscle once a week. For IM use only    . thyroid (ARMOUR) 60 MG tablet Take 60-120 mg by mouth 2 (two) times daily.     . nisoldipine (SULAR) 17 MG 24 hr tablet TAKE 1 TABLET BY MOUTH EVERY DAY ON AN EMPTY STOMACH 90 tablet 1  . telmisartan (MICARDIS) 80 MG tablet TAKE 1 TABLET DAILY 90 tablet 1   No facility-administered medications prior to visit.     ROS Review of Systems  Constitutional: Positive  for chills and fever. Negative for activity change, appetite change, diaphoresis, fatigue and unexpected weight change.  HENT: Negative for facial swelling, sinus pressure, sore throat, trouble swallowing and voice change.   Eyes: Negative for visual disturbance.  Respiratory: Positive for cough. Negative for chest tightness, shortness of breath, wheezing and stridor.   Cardiovascular: Negative for chest pain, palpitations and leg swelling.  Gastrointestinal: Positive for diarrhea. Negative for abdominal pain, blood in stool, constipation, nausea and vomiting.  Endocrine: Negative.   Genitourinary: Negative.  Negative for decreased urine volume, difficulty urinating, flank pain, frequency and urgency.  Musculoskeletal: Negative.  Negative for back pain and neck pain.  Skin: Negative.   Allergic/Immunologic: Negative.   Neurological: Positive for dizziness, weakness and light-headedness. Negative for syncope and numbness.  Hematological: Negative.  Negative for adenopathy. Does not bruise/bleed easily.  Psychiatric/Behavioral: Negative.     Objective:  BP (!) 110/50 (BP Location: Right Arm, Patient Position: Sitting, Cuff Size: Normal)   Pulse (!) 56   Temp 98.1 F (36.7 C) (Oral)   Resp 16   Ht 6\' 1"  (1.854 m)   Wt 220 lb 8 oz (100 kg)   SpO2 93%   BMI 29.09 kg/m   BP Readings from Last 3 Encounters:  06/07/16 [name])  110/50  12/16/15 132/62  09/10/15 114/68    Wt Readings from Last 3 Encounters:  06/07/16 220 lb 8 oz (100 kg)  12/16/15 215 lb (97.5 kg)  09/10/15 217 lb (98.4 kg)    Physical Exam  Constitutional: He is oriented to person, place, and time.  Non-toxic appearance. He does not have a sickly appearance. He appears ill. No distress.  Occasional cough noted  HENT:  Mouth/Throat: Oropharynx is clear and moist. No oropharyngeal exudate.  Eyes: Conjunctivae are normal. Right eye exhibits no discharge. Left eye exhibits no discharge. No scleral icterus.  Neck: Normal  range of motion. Neck supple. No JVD present. No tracheal deviation present. No thyromegaly present.  Cardiovascular: Normal rate, regular rhythm and intact distal pulses.  Exam reveals no gallop and no friction rub.   Murmur heard.  Systolic murmur is present with a grade of 1/6   No diastolic murmur is present  EKG -  Sinus  Bradycardia  -Old inferior-apical infarct.   -  Diffuse nonspecific T-abnormality.   ABNORMAL - T wave changes are new but do not appear to be significant  Pulmonary/Chest: Effort normal and breath sounds normal. No stridor. No respiratory distress. He has no wheezes. He has no rales. He exhibits no tenderness.  Abdominal: Soft. Bowel sounds are normal. He exhibits no distension and no mass. There is no tenderness. There is no rebound and no guarding.  Musculoskeletal: Normal range of motion. He exhibits no edema, tenderness or deformity.  Lymphadenopathy:    He has no cervical adenopathy.  Neurological: He is oriented to person, place, and time.  Skin: Skin is warm and dry. No rash noted. He is not diaphoretic. No erythema. No pallor.  Vitals reviewed.   Lab Results  Component Value Date   WBC 6.8 06/07/2016   HGB 18.8 Repeated and verified X2. (HH) 06/07/2016   HCT 56.3 Repeated and verified X2. (H) 06/07/2016   PLT 281.0 06/07/2016   GLUCOSE 87 06/07/2016   CHOL 168 11/17/2015   TRIG 132 11/17/2015   HDL 42 11/17/2015   LDLCALC 100 11/17/2015   ALT 19 06/07/2016   AST 20 06/07/2016   NA 138 06/07/2016   K 4.6 06/07/2016   CL 103 06/07/2016   CREATININE 1.63 (H) 06/07/2016   BUN 34 (H) 06/07/2016   CO2 26 06/07/2016   TSH 0.62 11/17/2015   PSA 5.7 11/17/2015   INR 0.96 07/28/2014    Ct Pelvis W Contrast  Result Date: 04/14/2015 CLINICAL DATA:  Patient with left inguinal swelling for 6 months. EXAM: CT PELVIS WITH CONTRAST TECHNIQUE: Multidetector CT imaging of the pelvis was performed using the standard protocol following the bolus  administration of intravenous contrast. CONTRAST:  132mL OMNIPAQUE IOHEXOL 300 MG/ML  SOLN COMPARISON:  None. FINDINGS: Herniation of fat within the anterior left lower abdominal wall underlying the palpable marker, most compatible with fat containing femoral hernia. Peripheral calcified atherosclerotic plaque involving the abdominal aorta and iliac vessels. No pelvic adenopathy. The prostate is enlarged and heterogeneous in attenuation. Urinary bladder is unremarkable. The visualized large and small bowel is unremarkable. Sequelae of prior lower anterior abdominal wall ventral hernia repair. Lower lumbar spine degenerative changes. No aggressive or acute appearing osseous lesions. IMPRESSION: Findings most compatible with fat containing left femoral hernia. Electronically Signed   By: Lovey Newcomer M.D.   On: 04/14/2015 14:45    Assessment & Plan:   Kule was seen today for cough.  Diagnoses and all orders for this  visit:  Cough- chest x-ray is positive for concern for left lower lobe pneumonia -     Cancel: DG Chest 2 View; Future -     DG Chest 2 View; Future -     promethazine-dextromethorphan (PROMETHAZINE-DM) 6.25-15 MG/5ML syrup; Take 5 mLs by mouth 4 (four) times daily as needed for cough.  Essential hypertension, benign- his blood pressure is low and he appears dehydrated with prerenal azotemia. I think this is caused by the daily illness and dehydration but for the next few weeks have asked him to hold both of his antihypertensives a calcium channel blocker and the ARB. He will increase his fluid intake. -     Cancel: Magnesium; Future -     Cancel: Comprehensive metabolic panel; Future -     Cancel: CBC with Differential/Platelet; Future -     Thyroid Panel With TSH; Future -     CBC with Differential/Platelet; Future -     Comprehensive metabolic panel; Future -     Magnesium; Future  Diarrhea, unspecified type- his white cell count is normal so I don't think he has C. difficile  infection, I think the diarrhea is part of the overall illness but since he is dehydrated will treat aggressively with oral fluids. -     Cancel: CBC with Differential/Platelet; Future -     CBC with Differential/Platelet; Future -     Comprehensive metabolic panel; Future -     Magnesium; Future  Systolic murmur- based on his old records this appears to be a new murmur. I've asked him to undergo an echocardiogram to see if he has aortic stenosis. -     ECHOCARDIOGRAM COMPLETE; Future -     EKG 12-Lead  Nonspecific abnormal electrocardiogram (ECG) (EKG)- he has subtle but nonspecific and nondiagnostic EKG changes. His cardiac panel shows an insignificant increase in the CK-MB percentage and total but his troponin level is negative. I don't think any of his current symptoms are related to cardiac ischemia. Will get an ECHO to screen for valvular disease and wall motion abnormalities. -     Troponin I; Future -     Cardiac panel; Future -     CBC with Differential/Platelet; Future -     Comprehensive metabolic panel; Future -     Magnesium; Future  Community acquired pneumonia of left lower lobe of lung ([place]) -     promethazine-dextromethorphan (PROMETHAZINE-DM) 6.25-15 MG/5ML syrup; Take 5 mLs by mouth 4 (four) times daily as needed for cough. -     cefdinir (OMNICEF) 300 MG capsule; Take 1 capsule (300 mg total) by mouth 2 (two) times daily.   I have discontinued Mr. Lutman telmisartan and nisoldipine. I am also having him start on promethazine-dextromethorphan and cefdinir. Additionally, I am having him maintain his ezetimibe, fluticasone, potassium gluconate, thyroid, acyclovir, acidophilus, acetaminophen, testosterone cypionate, DEXILANT, Plant Sterols and Stanols (CHOLEST OFF PO), and Nutritional Supplements (DHEA PO).  Meds ordered this encounter  Medications  . promethazine-dextromethorphan (PROMETHAZINE-DM) 6.25-15 MG/5ML syrup    Sig: Take 5 mLs by mouth 4 (four) times daily as  needed for cough.    Dispense:  118 mL    Refill:  1  . cefdinir (OMNICEF) 300 MG capsule    Sig: Take 1 capsule (300 mg total) by mouth 2 (two) times daily.    Dispense:  20 capsule    Refill:  0     Follow-up: Return in about 1 day (around 06/08/2016).  Scarlette Calico, MD

## 2016-06-07 NOTE — Progress Notes (Signed)
Pre visit review using our clinic review tool, if applicable. No additional management support is needed unless otherwise documented below in the visit note. 

## 2016-06-07 NOTE — Telephone Encounter (Signed)
Critical hgb 18.8

## 2016-06-07 NOTE — Patient Instructions (Signed)
Cough, Adult Coughing is a reflex that clears your throat and your airways. Coughing helps to heal and protect your lungs. It is normal to cough occasionally, but a cough that happens with other symptoms or lasts a long time may be a sign of a condition that needs treatment. A cough may last only 2-3 weeks (acute), or it may last longer than 8 weeks (chronic). What are the causes? Coughing is commonly caused by:  Breathing in substances that irritate your lungs.  A viral or bacterial respiratory infection.  Allergies.  Asthma.  Postnasal drip.  Smoking.  Acid backing up from the stomach into the esophagus (gastroesophageal reflux).  Certain medicines.  Chronic lung problems, including COPD (or rarely, lung cancer).  Other medical conditions such as heart failure.  Follow these instructions at home: Pay attention to any changes in your symptoms. Take these actions to help with your discomfort:  Take medicines only as told by your health care provider. ? If you were prescribed an antibiotic medicine, take it as told by your health care provider. Do not stop taking the antibiotic even if you start to feel better. ? Talk with your health care provider before you take a cough suppressant medicine.  Drink enough fluid to keep your urine clear or pale yellow.  If the air is dry, use a cold steam vaporizer or humidifier in your bedroom or your home to help loosen secretions.  Avoid anything that causes you to cough at work or at home.  If your cough is worse at night, try sleeping in a semi-upright position.  Avoid cigarette smoke. If you smoke, quit smoking. If you need help quitting, ask your health care provider.  Avoid caffeine.  Avoid alcohol.  Rest as needed.  Contact a health care provider if:  You have new symptoms.  You cough up pus.  Your cough does not get better after 2-3 weeks, or your cough gets worse.  You cannot control your cough with suppressant  medicines and you are losing sleep.  You develop pain that is getting worse or pain that is not controlled with pain medicines.  You have a fever.  You have unexplained weight loss.  You have night sweats. Get help right away if:  You cough up blood.  You have difficulty breathing.  Your heartbeat is very fast. This information is not intended to replace advice given to you by your health care provider. Make sure you discuss any questions you have with your health care provider. Document Released: 11/18/2010 Document Revised: 10/28/2015 Document Reviewed: 07/29/2014 Elsevier Interactive Patient Education  2017 [employer]  

## 2016-06-08 ENCOUNTER — Other Ambulatory Visit: Payer: Self-pay | Admitting: Dermatology

## 2016-06-08 DIAGNOSIS — L570 Actinic keratosis: Secondary | ICD-10-CM | POA: Diagnosis not present

## 2016-06-08 DIAGNOSIS — L821 Other seborrheic keratosis: Secondary | ICD-10-CM | POA: Diagnosis not present

## 2016-06-08 DIAGNOSIS — D492 Neoplasm of unspecified behavior of bone, soft tissue, and skin: Secondary | ICD-10-CM | POA: Diagnosis not present

## 2016-06-08 DIAGNOSIS — L720 Epidermal cyst: Secondary | ICD-10-CM | POA: Diagnosis not present

## 2016-06-08 DIAGNOSIS — D0422 Carcinoma in situ of skin of left ear and external auricular canal: Secondary | ICD-10-CM | POA: Diagnosis not present

## 2016-06-08 LAB — THYROID PANEL WITH TSH
FREE THYROXINE INDEX: 1.7 (ref 1.4–3.8)
T3 Uptake: 37 % — ABNORMAL HIGH (ref 22–35)
T4 TOTAL: 4.5 ug/dL (ref 4.5–12.0)
TSH: 2.13 mIU/L (ref 0.40–4.50)

## 2016-06-12 ENCOUNTER — Telehealth: Payer: Self-pay | Admitting: Internal Medicine

## 2016-06-12 NOTE — Telephone Encounter (Signed)
Pt called in and

## 2016-06-14 ENCOUNTER — Other Ambulatory Visit (INDEPENDENT_AMBULATORY_CARE_PROVIDER_SITE_OTHER): Payer: Medicare Other

## 2016-06-14 ENCOUNTER — Encounter: Payer: Self-pay | Admitting: Internal Medicine

## 2016-06-14 ENCOUNTER — Ambulatory Visit (INDEPENDENT_AMBULATORY_CARE_PROVIDER_SITE_OTHER)
Admission: RE | Admit: 2016-06-14 | Discharge: 2016-06-14 | Disposition: A | Discharge status: Home / Self Care | Payer: Medicare Other | Source: Ambulatory Visit | Attending: Internal Medicine | Admitting: Internal Medicine

## 2016-06-14 ENCOUNTER — Ambulatory Visit (INDEPENDENT_AMBULATORY_CARE_PROVIDER_SITE_OTHER): Payer: Medicare Other | Admitting: Internal Medicine

## 2016-06-14 VITALS — BP 142/70 | HR 56 | Temp 98.6°F | Resp 16 | Ht 73.0 in | Wt 220.5 lb

## 2016-06-14 DIAGNOSIS — R9431 Abnormal electrocardiogram [ECG] [EKG]: Secondary | ICD-10-CM

## 2016-06-14 DIAGNOSIS — N183 Chronic kidney disease, stage 3 unspecified: Secondary | ICD-10-CM | POA: Insufficient documentation

## 2016-06-14 DIAGNOSIS — R0602 Shortness of breath: Secondary | ICD-10-CM | POA: Diagnosis not present

## 2016-06-14 DIAGNOSIS — J181 Lobar pneumonia, unspecified organism: Secondary | ICD-10-CM | POA: Diagnosis not present

## 2016-06-14 DIAGNOSIS — I10 Essential (primary) hypertension: Secondary | ICD-10-CM | POA: Diagnosis not present

## 2016-06-14 DIAGNOSIS — R05 Cough: Secondary | ICD-10-CM | POA: Diagnosis not present

## 2016-06-14 DIAGNOSIS — J189 Pneumonia, unspecified organism: Secondary | ICD-10-CM

## 2016-06-14 DIAGNOSIS — R059 Cough, unspecified: Secondary | ICD-10-CM

## 2016-06-14 DIAGNOSIS — N182 Chronic kidney disease, stage 2 (mild): Secondary | ICD-10-CM

## 2016-06-14 LAB — BASIC METABOLIC PANEL
BUN: 22 mg/dL (ref 6–23)
CO2: 25 mEq/L (ref 19–32)
Calcium: 10.5 mg/dL (ref 8.4–10.5)
Chloride: 105 mEq/L (ref 96–112)
Creatinine, Ser: 1.19 mg/dL (ref 0.40–1.50)
GFR: 63.18 mL/min (ref >60.00)
GLUCOSE: 95 mg/dL (ref 70–99)
POTASSIUM: 4.3 meq/L (ref 3.5–5.1)
SODIUM: 139 meq/L (ref 135–145)

## 2016-06-14 NOTE — Patient Instructions (Signed)
Community-Acquired Pneumonia, Adult °Introduction °Pneumonia is an infection of the lungs. One type of pneumonia can happen while a person is in a hospital. A different type can happen when a person is not in a hospital (community-acquired pneumonia). It is easy for this kind to spread from person to person. It can spread to you if you breathe near an infected person who coughs or sneezes. Some symptoms include: °· A dry cough. °· A wet (productive) cough. °· Fever. °· Sweating. °· Chest pain. °Follow these instructions at home: °· Take over-the-counter and prescription medicines only as told by your doctor. °¨ Only take cough medicine if you are losing sleep. °¨ If you were prescribed an antibiotic medicine, take it as told by your doctor. Do not stop taking the antibiotic even if you start to feel better. °· Sleep with your head and neck raised (elevated). You can do this by putting a few pillows under your head, or you can sleep in a recliner. °· Do not use tobacco products. These include cigarettes, chewing tobacco, and e-cigarettes. If you need help quitting, ask your doctor. °· Drink enough water to keep your pee (urine) clear or pale yellow. °A shot (vaccine) can help prevent pneumonia. Shots are often suggested for: °· People older than 76 years of age. °· People older than 76 years of age: °¨ Who are having cancer treatment. °¨ Who have long-term (chronic) lung disease. °¨ Who have problems with their body's defense system (immune system). °You may also prevent pneumonia if you take these actions: °· Get the flu (influenza) shot every year. °· Go to the dentist as often as told. °· Wash your hands often. If soap and water are not available, use hand sanitizer. °Contact a doctor if: °· You have a fever. °· You lose sleep because your cough medicine does not help. °Get help right away if: °· You are short of breath and it gets worse. °· You have more chest pain. °· Your sickness gets worse. This is very  serious if: °¨ You are an older adult. °¨ Your body's defense system is weak. °· You cough up blood. °This information is not intended to replace advice given to you by your health care provider. Make sure you discuss any questions you have with your health care provider. °Document Released: 11/08/2007 Document Revised: 10/28/2015 Document Reviewed: 09/16/2014 °© 2017 Elsevier ° °

## 2016-06-14 NOTE — Progress Notes (Signed)
Pre visit review using our clinic review tool, if applicable. No additional management support is needed unless otherwise documented below in the visit note. 

## 2016-06-14 NOTE — Progress Notes (Signed)
Subjective:  Patient ID: [name] Glass, male    DOB: 08/12/1940  Age: 76 y.o. MRN: [id number]  CC: Cough   HPI [name] Glass presents for a one-week follow-up after being seen for pneumonia and dehydration. He tells me the cough was improving and was productive of clear phlegm until a few days prior to this visit with the cough worsened and became productive of thick yellow phlegm. He has had a few night sweats and nasal congestion but denies fever, chills, hemoptysis, chest pain, wheezing, or shortness of breath.  Outpatient Medications Prior to Visit  Medication Sig Dispense Refill  . acetaminophen (TYLENOL) 650 MG CR tablet Take 1,300 mg by mouth at bedtime. Reported on 12/16/2015    . acidophilus (RISAQUAD) CAPS capsule Take 1 capsule by mouth 2 (two) times daily.    [name] acyclovir (ZOVIRAX) 200 MG capsule Take 200-400 mg by mouth 2 (two) times daily.   0  . DEXILANT 60 MG capsule Take 1 capsule by mouth daily.  1  . ezetimibe (ZETIA) 10 MG tablet Take 10 mg by mouth every evening.    . fluticasone (FLONASE) 50 MCG/ACT nasal spray Place 2 sprays into the nose daily.     . Nutritional Supplements (DHEA PO) Take 25 mg by mouth.    . Plant Sterols and Stanols (CHOLEST OFF PO) Take 900 mg by mouth.    . potassium gluconate 595 MG TABS Take 595 mg by mouth every evening.    . testosterone cypionate (DEPOTESTOTERONE CYPIONATE) 100 MG/ML injection Inject into the muscle once a week. For IM use only    . thyroid (ARMOUR) 60 MG tablet Take 60-120 mg by mouth 2 (two) times daily.     . cefdinir (OMNICEF) 300 MG capsule Take 1 capsule (300 mg total) by mouth 2 (two) times daily. 20 capsule 0  . promethazine-dextromethorphan (PROMETHAZINE-DM) 6.25-15 MG/5ML syrup Take 5 mLs by mouth 4 (four) times daily as needed for cough. 118 mL 1   No facility-administered medications prior to visit.     ROS Review of Systems  Constitutional: Negative for appetite change, chills, diaphoresis, fatigue and  fever.  HENT: Positive for congestion. Negative for facial swelling, sinus pressure, sore throat and trouble swallowing.   Eyes: Negative for visual disturbance.  Respiratory: Positive for cough. Negative for chest tightness, shortness of breath and wheezing.   Cardiovascular: Negative for chest pain, palpitations and leg swelling.  Gastrointestinal: Positive for diarrhea. Negative for abdominal pain, blood in stool, constipation, nausea and vomiting.       He has had a few episodes of diarrhea  Endocrine: Negative for polydipsia, polyphagia and polyuria.  Genitourinary: Negative.  Negative for difficulty urinating, dysuria and urgency.  Musculoskeletal: Negative.  Negative for back pain and neck pain.  Skin: Negative.  Negative for color change and rash.  Allergic/Immunologic: Negative.   Neurological: Negative.  Negative for dizziness, weakness, light-headedness, numbness and headaches.  Hematological: Negative.  Negative for adenopathy. Does not bruise/bleed easily.  Psychiatric/Behavioral: Negative.     Objective:  BP (!) 142/70   Pulse (!) 56   Temp 98.6 F (37 C) (Oral)   Resp 16   Ht 6\' 1"  (1.854 m)   Wt 220 lb 8 oz (100 kg)   SpO2 98%   BMI 29.09 kg/m   BP Readings from Last 3 Encounters:  06/14/16 (!) 142/70  06/07/16 (!) 110/50  12/16/15 132/62    Wt Readings from Last 3 Encounters:  06/14/16 220 lb  8 oz (100 kg)  06/07/16 220 lb 8 oz (100 kg)  12/16/15 215 lb (97.5 kg)    Physical Exam  Constitutional: He is oriented to person, place, and time.  Non-toxic appearance. He does not have a sickly appearance. He does not appear ill. No distress.  HENT:  Mouth/Throat: Oropharynx is clear and moist. No oropharyngeal exudate.  Eyes: Conjunctivae are normal. Right eye exhibits no discharge. Left eye exhibits no discharge. No scleral icterus.  Neck: Normal range of motion. Neck supple. No JVD present. No tracheal deviation present. No thyromegaly present.    Cardiovascular: Normal rate, regular rhythm, S1 normal, S2 normal and intact distal pulses.  Exam reveals no gallop and no friction rub.   Murmur heard.  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   ABNORMAL   Pulmonary/Chest: Effort normal and breath sounds normal. No stridor. No respiratory distress. He has no wheezes. He has no rales. He exhibits no tenderness.  Abdominal: Soft. Bowel sounds are normal. He exhibits no distension and no mass. There is no tenderness. There is no rebound and no guarding.  Musculoskeletal: Normal range of motion. He exhibits no edema, tenderness or deformity.  Lymphadenopathy:    He has no cervical adenopathy.  Neurological: He is oriented to person, place, and time.  Skin: Skin is warm and dry. No rash noted. He is not diaphoretic. No erythema. No pallor.  Vitals reviewed.   Lab Results  Component Value Date   WBC 6.8 06/07/2016   HGB 18.8 Repeated and verified X2. (HH) 06/07/2016   HCT 56.3 Repeated and verified X2. (H) 06/07/2016   PLT 281.0 06/07/2016   GLUCOSE 95 06/14/2016   CHOL 168 11/17/2015   TRIG 132 11/17/2015   HDL 42 11/17/2015   LDLCALC 100 11/17/2015   ALT 19 06/07/2016   AST 20 06/07/2016   NA 139 06/14/2016   K 4.3 06/14/2016   CL 105 06/14/2016   CREATININE 1.19 06/14/2016   BUN 22 06/14/2016   CO2 25 06/14/2016   TSH 2.13 06/07/2016   PSA 5.7 11/17/2015   INR 0.96 07/28/2014    Dg Chest 2 View  Result Date: 06/07/2016 CLINICAL DATA:  Productive cough. Chest congestion. Shortness of breath. Fever over the last 6 days PE EXAM: CHEST  2 VIEW COMPARISON:  04/21/2014.  08/01/2013. FINDINGS: Heart size is normal. Chronic aortic atherosclerosis. Upper lungs are clear. There chronic markings in the lower lungs, but these may be slightly accentuated the left  base with a could be mild atelectasis/ pneumonia. No effusion. No acute bone finding. IMPRESSION: Chronic basilar scarring. Question slight worsening of density at the left base that could go along with mild atelectasis/left base pneumonia. Electronically Signed   By: Nelson Chimes M.D.   On: 06/07/2016 14:52    Assessment & Plan:   Ayvan was seen today for cough.  Diagnoses and all orders for this visit:  Nonspecific abnormal electrocardiogram (ECG) (EKG)- his EKG is unchanged and shows no signs of ischemia -     EKG 12-Lead  Community acquired pneumonia of left lower lobe of lung ([place])- his symptoms have worsened over the last few days and his chest x-ray has not improved so have asked him to start taking Augmentin to treat resistant gram-positive organisms. He will continue Phenergan DM as  needed for the cough. -     DG Chest 2 View; Future -     promethazine-dextromethorphan (PROMETHAZINE-DM) 6.25-15 MG/5ML syrup; Take 5 mLs by mouth 4 (four) times daily as needed for cough. -     amoxicillin-clavulanate (AUGMENTIN) 875-125 MG tablet; Take 1 tablet by mouth 2 (two) times daily.  Essential hypertension, benign- his renal function and electrolytes have normalized, his blood pressure is adequately well controlled so I do not recommend that he start taking an antihypertensive at this time. -     Basic metabolic panel; Future  Chronic renal disease, stage 3, moderately decreased glomerular filtration rate (GFR) between 30-59 mL/min/1.73 square meter -     Basic metabolic panel; Future  Cough -     promethazine-dextromethorphan (PROMETHAZINE-DM) 6.25-15 MG/5ML syrup; Take 5 mLs by mouth 4 (four) times daily as needed for cough.  Chronic renal disease, stage 2, mildly decreased glomerular filtration rate (GFR) between 60-89 mL/min/1.73 square meter- his renal function has improved significantly over the last week. His dehydration has resolved. He agrees to avoid nephrotoxic agents.   I have  discontinued Mr. Lenhard's cefdinir. I am also having him start on amoxicillin-clavulanate. Additionally, I am having him maintain his ezetimibe, fluticasone, potassium gluconate, thyroid, acyclovir, acidophilus, acetaminophen, testosterone cypionate, DEXILANT, Plant Sterols and Stanols (CHOLEST OFF PO), Nutritional Supplements (DHEA PO), and promethazine-dextromethorphan.  Meds ordered this encounter  Medications  . promethazine-dextromethorphan (PROMETHAZINE-DM) 6.25-15 MG/5ML syrup    Sig: Take 5 mLs by mouth 4 (four) times daily as needed for cough.    Dispense:  118 mL    Refill:  1  . amoxicillin-clavulanate (AUGMENTIN) 875-125 MG tablet    Sig: Take 1 tablet by mouth 2 (two) times daily.    Dispense:  20 tablet    Refill:  0     Follow-up: Return in about 3 weeks (around 07/05/2016).  Scarlette Calico, MD

## 2016-06-15 MED ORDER — AMOXICILLIN-POT CLAVULANATE 875-125 MG PO TABS: 1.0000 | ORAL_TABLET | Freq: Two times a day (BID) | ORAL | 0 refills | Status: AC

## 2016-06-15 MED ORDER — PROMETHAZINE-DM 6.25-15 MG/5ML PO SYRP: 5.0000 mL | ORAL_SOLUTION | Freq: Four times a day (QID) | ORAL | 1 refills | Status: DC | PRN

## 2016-06-19 ENCOUNTER — Other Ambulatory Visit: Payer: Medicare Other

## 2016-06-19 ENCOUNTER — Encounter: Payer: Self-pay | Admitting: Internal Medicine

## 2016-06-19 ENCOUNTER — Ambulatory Visit (INDEPENDENT_AMBULATORY_CARE_PROVIDER_SITE_OTHER): Payer: Medicare Other | Admitting: Internal Medicine

## 2016-06-19 VITALS — BP 140/78 | HR 56 | Temp 98.0°F | Resp 16 | Ht 73.0 in | Wt 219.5 lb

## 2016-06-19 DIAGNOSIS — L03319 Cellulitis of trunk, unspecified: Principal | ICD-10-CM

## 2016-06-19 DIAGNOSIS — L02219 Cutaneous abscess of trunk, unspecified: Secondary | ICD-10-CM

## 2016-06-19 NOTE — Progress Notes (Signed)
Subjective:  Patient ID: [name] Glass, male    DOB: 03-02-1941  Age: 76 y.o. MRN: [id number]  CC: Abscess   HPI [name] Glass presents for a 4 day hx of red, painful, raised area over his upper back.  Outpatient Medications Prior to Visit  Medication Sig Dispense Refill  . acetaminophen (TYLENOL) 650 MG CR tablet Take 1,300 mg by mouth at bedtime. Reported on 12/16/2015    . acidophilus (RISAQUAD) CAPS capsule Take 1 capsule by mouth 2 (two) times daily.    [name] acyclovir (ZOVIRAX) 200 MG capsule Take 200-400 mg by mouth 2 (two) times daily.   0  . amoxicillin-clavulanate (AUGMENTIN) 875-125 MG tablet Take 1 tablet by mouth 2 (two) times daily. 20 tablet 0  . DEXILANT 60 MG capsule Take 1 capsule by mouth daily.  1  . ezetimibe (ZETIA) 10 MG tablet Take 10 mg by mouth every evening.    . fluticasone (FLONASE) 50 MCG/ACT nasal spray Place 2 sprays into the nose daily.     . Nutritional Supplements (DHEA PO) Take 25 mg by mouth.    . Plant Sterols and Stanols (CHOLEST OFF PO) Take 900 mg by mouth.    . potassium gluconate 595 MG TABS Take 595 mg by mouth every evening.    . promethazine-dextromethorphan (PROMETHAZINE-DM) 6.25-15 MG/5ML syrup Take 5 mLs by mouth 4 (four) times daily as needed for cough. 118 mL 1  . testosterone cypionate (DEPOTESTOTERONE CYPIONATE) 100 MG/ML injection Inject into the muscle once a week. For IM use only    . thyroid (ARMOUR) 60 MG tablet Take 60-120 mg by mouth 2 (two) times daily.      No facility-administered medications prior to visit.     ROS Review of Systems  Constitutional: Negative for appetite change, chills, fatigue and fever.  HENT: Negative.   Eyes: Negative for visual disturbance.  Respiratory: Negative.  Negative for cough, choking, shortness of breath and stridor.   Cardiovascular: Negative for chest pain, palpitations and leg swelling.  Gastrointestinal: Negative for abdominal pain, diarrhea, nausea and vomiting.  Genitourinary:  Negative.  Negative for difficulty urinating.  Musculoskeletal: Negative.   Skin: Negative.   Neurological: Negative.  Negative for dizziness.  Hematological: Negative.  Negative for adenopathy. Does not bruise/bleed easily.    Objective:  BP 140/78 (BP Location: Left Arm, Patient Position: Sitting, Cuff Size: Large)   Pulse (!) 56   Temp 98 F (36.7 C) (Oral)   Resp 16   Ht 6\' 1"  (1.854 m)   Wt 219 lb 8 oz (99.6 kg)   SpO2 91%   BMI 28.96 kg/m   BP Readings from Last 3 Encounters:  06/19/16 140/78  06/14/16 (!) 142/70  06/07/16 (!) 110/50    Wt Readings from Last 3 Encounters:  06/19/16 219 lb 8 oz (99.6 kg)  06/14/16 220 lb 8 oz (100 kg)  06/07/16 220 lb 8 oz (100 kg)    Physical Exam  Constitutional: He is oriented to person, place, and time. No distress.  HENT:  Mouth/Throat: Oropharynx is clear and moist. No oropharyngeal exudate.  Eyes: Conjunctivae are normal. Right eye exhibits no discharge. Left eye exhibits no discharge. No scleral icterus.  Neck: Normal range of motion. Neck supple. No JVD present. No tracheal deviation present. No thyromegaly present.  Cardiovascular: Normal rate, regular rhythm, normal heart sounds and intact distal pulses.  Exam reveals no gallop and no friction rub.   No murmur heard. Pulmonary/Chest: Effort normal and breath  sounds normal. No stridor. No respiratory distress. He has no wheezes. He has no rales. He exhibits no tenderness.  Abdominal: Soft. Bowel sounds are normal. He exhibits no distension and no mass. There is no tenderness. There is no rebound and no guarding.  Musculoskeletal:       Arms: Lymphadenopathy:    He has no cervical adenopathy.  Neurological: He is oriented to person, place, and time.  Skin: Skin is warm and dry. No rash noted. He is not diaphoretic. There is erythema. No pallor.  The area was cleaned with betadine then prepped and draped in sterile fashion. Local anesthesia was obtained with 2 mL of 2%  lidocaine with epinephrine. Next a 4 mm punch incision was made in the center and a moderate amount of thick purulent exudate was expressed. This was captured and sent for culture. The abscess was explored using H2O2 and Q-tips and several loculations were disrupted. The cavity was packed with iodoform. He tolerated this well. There is no blood loss.  Vitals reviewed.   Lab Results  Component Value Date   WBC 6.8 06/07/2016   HGB 18.8 Repeated and verified X2. (HH) 06/07/2016   HCT 56.3 Repeated and verified X2. (H) 06/07/2016   PLT 281.0 06/07/2016   GLUCOSE 95 06/14/2016   CHOL 168 11/17/2015   TRIG 132 11/17/2015   HDL 42 11/17/2015   LDLCALC 100 11/17/2015   ALT 19 06/07/2016   AST 20 06/07/2016   NA 139 06/14/2016   K 4.3 06/14/2016   CL 105 06/14/2016   CREATININE 1.19 06/14/2016   BUN 22 06/14/2016   CO2 25 06/14/2016   TSH 2.13 06/07/2016   PSA 5.7 11/17/2015   INR 0.96 07/28/2014    Dg Chest 2 View  Result Date: 06/14/2016 CLINICAL DATA:  Follow-up pneumonia, cough with shortness of breath EXAM: CHEST  2 VIEW COMPARISON:  06/07/2016, 04/21/2014, 08/01/2013 FINDINGS: Lungs are hyperinflated. Linear scarring or atelectasis at both bases. Interstitial opacities at the lingula, stable compared with 06/07/2016, slightly increased compared to 04/21/2014. Stable cardiomediastinal silhouette with atherosclerosis. No pneumothorax. Surgical clips in the upper abdomen IMPRESSION: 1. No significant interval change in interstitial opacities overlying the lingula which may reflect acute interstitial inflammation on chronic change 2. Stable bibasilar scarring Electronically Signed   By: Donavan Foil M.D.   On: 06/14/2016 17:06    Assessment & Plan:   [name] Glass was seen today for abscess.  Diagnoses and all orders for this visit:  Cellulitis and abscess of trunk- This appears to be an abscessed epidermal inclusion cyst. Will await the culture results and treat for MRSA if indicated. For  now he will continue the Augmentin he is taking for pneumonia. I've asked him to return in 2-3 days for the packing to be removed. -     WOUND CULTURE; Future   I am having [name] Glass maintain his ezetimibe, fluticasone, potassium gluconate, thyroid, acyclovir, acidophilus, acetaminophen, testosterone cypionate, DEXILANT, Plant Sterols and Stanols (CHOLEST OFF PO), Nutritional Supplements (DHEA PO), promethazine-dextromethorphan, and amoxicillin-clavulanate.  No orders of the defined types were placed in this encounter.    Follow-up: Return in about 3 days (around 06/22/2016).  Scarlette Calico, MD

## 2016-06-19 NOTE — Progress Notes (Signed)
Pre visit review using our clinic review tool, if applicable. No additional management support is needed unless otherwise documented below in the visit note. 

## 2016-06-19 NOTE — Patient Instructions (Signed)
Incision and Drainage Incision and drainage is a surgical procedure to open and drain a fluid-filled sac. The sac may be filled with pus, mucus, or blood. Examples of fluid-filled sacs that may need surgical drainage include cysts, skin infections (abscesses), and red lumps that develop from a ruptured cyst or a small abscess (boils). You may need this procedure if the affected area is large, painful, infected, or not healing well. Tell a health care provider about:  Any allergies you have.  All medicines you are taking, including vitamins, herbs, eye drops, creams, and over-the-counter medicines.  Any problems you or family members have had with anesthetic medicines.  Any blood disorders you have.  Any surgeries you have had.  Any medical conditions you have.  Whether you are pregnant or may be pregnant. What are the risks? Generally, this is a safe procedure. However, problems may occur, including:  Infection.  Bleeding.  Allergic reactions to medicines.  Scarring.  What happens before the procedure?  You may need an ultrasound or other imaging tests to see how large or deep the fluid-filled sac is.  You may have blood tests to check for infection.  You may get a tetanus shot.  You may be given antibiotic medicine to help prevent infection.  Follow instructions from your health care provider about eating or drinking restrictions.  Ask your health care provider about: ? Changing or stopping your regular medicines. This is especially important if you are taking diabetes medicines or blood thinners. ? Taking medicines such as aspirin and ibuprofen. These medicines can thin your blood. Do not take these medicines before your procedure if your health care provider instructs you not to.  Plan to have someone take you home after the procedure.  If you will be going home right after the procedure, plan to have someone stay with you for 24 hours. What happens during the  procedure?  To reduce your risk of infection: ? Your health care team will wash or sanitize their hands. ? Your skin will be washed with soap.  You will be given one or more of the following: ? A medicine to help you relax (sedative). ? A medicine to numb the area (local anesthetic). ? A medicine to make you fall asleep (general anesthetic).  An incision will be made in the top of the fluid-filled sac.  The contents of the sac may be squeezed out, or a syringe or tube (catheter)may be used to empty the sac.  The catheter may be left in place for several weeks to drain any fluid. Or, your health care provider may stitch open the edges of the incision to make a long-term opening for drainage (marsupialization).  The inside of the sac may be washed out (irrigated) with a sterile solution and packed with gauze before it is covered with a bandage (dressing). The procedure may vary among health care providers and hospitals. What happens after the procedure?  Your blood pressure, heart rate, breathing rate, and blood oxygen level will be monitored often until the medicines you were given have worn off.  Do not drive for 24 hours if you received a sedative. This information is not intended to replace advice given to you by your health care provider. Make sure you discuss any questions you have with your health care provider. Document Released: 11/15/2000 Document Revised: 10/28/2015 Document Reviewed: 03/12/2015 Elsevier Interactive Patient Education  2017 [employer]  

## 2016-06-22 LAB — WOUND CULTURE
GRAM STAIN: NONE SEEN
Gram Stain: NONE SEEN
Organism ID, Bacteria: NORMAL

## 2016-07-13 ENCOUNTER — Encounter: Payer: Self-pay | Admitting: Internal Medicine

## 2016-07-13 ENCOUNTER — Ambulatory Visit (INDEPENDENT_AMBULATORY_CARE_PROVIDER_SITE_OTHER)
Admission: RE | Admit: 2016-07-13 | Discharge: 2016-07-13 | Disposition: A | Discharge status: Home / Self Care | Payer: Medicare Other | Source: Ambulatory Visit | Attending: Internal Medicine | Admitting: Internal Medicine

## 2016-07-13 ENCOUNTER — Ambulatory Visit (INDEPENDENT_AMBULATORY_CARE_PROVIDER_SITE_OTHER): Payer: Medicare Other | Admitting: Internal Medicine

## 2016-07-13 VITALS — BP 140/58 | HR 55 | Temp 98.1°F | Resp 16 | Ht 73.0 in | Wt 224.0 lb

## 2016-07-13 DIAGNOSIS — R05 Cough: Secondary | ICD-10-CM

## 2016-07-13 DIAGNOSIS — J181 Lobar pneumonia, unspecified organism: Secondary | ICD-10-CM | POA: Diagnosis not present

## 2016-07-13 DIAGNOSIS — R059 Cough, unspecified: Secondary | ICD-10-CM

## 2016-07-13 DIAGNOSIS — Z23 Encounter for immunization: Secondary | ICD-10-CM | POA: Diagnosis not present

## 2016-07-13 DIAGNOSIS — J189 Pneumonia, unspecified organism: Secondary | ICD-10-CM

## 2016-07-13 DIAGNOSIS — I10 Essential (primary) hypertension: Secondary | ICD-10-CM | POA: Diagnosis not present

## 2016-07-13 NOTE — Patient Instructions (Signed)
Cough, Adult Coughing is a reflex that clears your throat and your airways. Coughing helps to heal and protect your lungs. It is normal to cough occasionally, but a cough that happens with other symptoms or lasts a long time may be a sign of a condition that needs treatment. A cough may last only 2-3 weeks (acute), or it may last longer than 8 weeks (chronic). What are the causes? Coughing is commonly caused by:  Breathing in substances that irritate your lungs.  A viral or bacterial respiratory infection.  Allergies.  Asthma.  Postnasal drip.  Smoking.  Acid backing up from the stomach into the esophagus (gastroesophageal reflux).  Certain medicines.  Chronic lung problems, including COPD (or rarely, lung cancer).  Other medical conditions such as heart failure.  Follow these instructions at home: Pay attention to any changes in your symptoms. Take these actions to help with your discomfort:  Take medicines only as told by your health care provider. ? If you were prescribed an antibiotic medicine, take it as told by your health care provider. Do not stop taking the antibiotic even if you start to feel better. ? Talk with your health care provider before you take a cough suppressant medicine.  Drink enough fluid to keep your urine clear or pale yellow.  If the air is dry, use a cold steam vaporizer or humidifier in your bedroom or your home to help loosen secretions.  Avoid anything that causes you to cough at work or at home.  If your cough is worse at night, try sleeping in a semi-upright position.  Avoid cigarette smoke. If you smoke, quit smoking. If you need help quitting, ask your health care provider.  Avoid caffeine.  Avoid alcohol.  Rest as needed.  Contact a health care provider if:  You have new symptoms.  You cough up pus.  Your cough does not get better after 2-3 weeks, or your cough gets worse.  You cannot control your cough with suppressant  medicines and you are losing sleep.  You develop pain that is getting worse or pain that is not controlled with pain medicines.  You have a fever.  You have unexplained weight loss.  You have night sweats. Get help right away if:  You cough up blood.  You have difficulty breathing.  Your heartbeat is very fast. This information is not intended to replace advice given to you by your health care provider. Make sure you discuss any questions you have with your health care provider. Document Released: 11/18/2010 Document Revised: 10/28/2015 Document Reviewed: 07/29/2014 Elsevier Interactive Patient Education  2017 [employer]  

## 2016-07-13 NOTE — Progress Notes (Signed)
Pre visit review using our clinic review tool, if applicable. No additional management support is needed unless otherwise documented below in the visit note. 

## 2016-07-13 NOTE — Progress Notes (Signed)
Subjective:  Patient ID: [name] Glass, male    DOB: 08-18-1940  Age: 76 y.o. MRN: [id number]  CC: Cough   HPI [name] Glass presents for follow-up. He was seen about a month ago for upper respiratory infection and had an abnormal chest x-ray showing scar over the left lower lobe. He was feeling well until about 5 days ago when he developed another respiratory illness and complains of mild cough productive of scant amount of yellow phlegm. He denies fever, chills, night sweats, chest pain, hemoptysis, dizziness, or lightheadedness.  Outpatient Medications Prior to Visit  Medication Sig Dispense Refill  . acetaminophen (TYLENOL) 650 MG CR tablet Take 1,300 mg by mouth at bedtime. Reported on 12/16/2015    . acidophilus (RISAQUAD) CAPS capsule Take 1 capsule by mouth 2 (two) times daily.    [name] acyclovir (ZOVIRAX) 200 MG capsule Take 200-400 mg by mouth 2 (two) times daily.   0  . DEXILANT 60 MG capsule Take 1 capsule by mouth daily.  1  . ezetimibe (ZETIA) 10 MG tablet Take 10 mg by mouth every evening.    . fluticasone (FLONASE) 50 MCG/ACT nasal spray Place 2 sprays into the nose daily.     . Nutritional Supplements (DHEA PO) Take 25 mg by mouth.    . Plant Sterols and Stanols (CHOLEST OFF PO) Take 900 mg by mouth.    . potassium gluconate 595 MG TABS Take 595 mg by mouth every evening.    . testosterone cypionate (DEPOTESTOTERONE CYPIONATE) 100 MG/ML injection Inject into the muscle once a week. For IM use only    . thyroid (ARMOUR) 60 MG tablet Take 60-120 mg by mouth 2 (two) times daily.     . promethazine-dextromethorphan (PROMETHAZINE-DM) 6.25-15 MG/5ML syrup Take 5 mLs by mouth 4 (four) times daily as needed for cough. (Patient not taking: Reported on 07/13/2016) 118 mL 1   No facility-administered medications prior to visit.     ROS Review of Systems  Constitutional: Negative.  Negative for activity change, appetite change, chills, fatigue, fever and unexpected weight change.    HENT: Negative.  Negative for facial swelling, sinus pressure, sore throat and trouble swallowing.   Respiratory: Positive for cough. Negative for chest tightness, shortness of breath, wheezing and stridor.   Cardiovascular: Negative for chest pain, palpitations and leg swelling.  Gastrointestinal: Negative for abdominal pain, blood in stool, constipation, diarrhea, nausea and vomiting.  Endocrine: Negative.   Genitourinary: Negative.  Negative for difficulty urinating.  Musculoskeletal: Negative.   Skin: Negative.  Negative for color change and rash.  Allergic/Immunologic: Negative.   Neurological: Negative.  Negative for dizziness, syncope, weakness and light-headedness.  Hematological: Negative.  Negative for adenopathy. Does not bruise/bleed easily.  Psychiatric/Behavioral: Negative.     Objective:  BP (!) 140/58 (BP Location: Left Arm, Patient Position: Sitting, Cuff Size: Normal)   Pulse (!) 55   Temp 98.1 F (36.7 C) (Oral)   Resp 16   Ht 6\' 1"  (1.854 m)   Wt 224 lb (101.6 kg)   SpO2 94%   BMI 29.55 kg/m   BP Readings from Last 3 Encounters:  07/13/16 (!) 140/58  06/19/16 140/78  06/14/16 (!) 142/70    Wt Readings from Last 3 Encounters:  07/13/16 224 lb (101.6 kg)  06/19/16 219 lb 8 oz (99.6 kg)  06/14/16 220 lb 8 oz (100 kg)    Physical Exam  Constitutional: He is oriented to person, place, and time.  Non-toxic appearance. He does  not have a sickly appearance. He does not appear ill. No distress.  HENT:  Mouth/Throat: Oropharynx is clear and moist. No oropharyngeal exudate.  Eyes: Conjunctivae are normal. Right eye exhibits no discharge. Left eye exhibits no discharge. No scleral icterus.  Neck: Normal range of motion. Neck supple. No JVD present. No tracheal deviation present. No thyromegaly present.  Cardiovascular: Normal rate, regular rhythm and intact distal pulses.  Exam reveals no gallop and no friction rub.   Murmur heard.  Decrescendo systolic murmur  is present with a grade of 1/6   No diastolic murmur is present  Pulmonary/Chest: Effort normal and breath sounds normal. No stridor. No respiratory distress. He has no wheezes. He has no rales. He exhibits no tenderness.  Abdominal: Soft. Bowel sounds are normal. He exhibits no distension and no mass. There is no tenderness. There is no rebound and no guarding.  Musculoskeletal: Normal range of motion. He exhibits no edema, tenderness or deformity.  Lymphadenopathy:    He has no cervical adenopathy.  Neurological: He is oriented to person, place, and time.  Skin: Skin is warm and dry. No rash noted. He is not diaphoretic. No erythema. No pallor.  Vitals reviewed.   Lab Results  Component Value Date   WBC 6.8 06/07/2016   HGB 18.8 Repeated and verified X2. (HH) 06/07/2016   HCT 56.3 Repeated and verified X2. (H) 06/07/2016   PLT 281.0 06/07/2016   GLUCOSE 95 06/14/2016   CHOL 168 11/17/2015   TRIG 132 11/17/2015   HDL 42 11/17/2015   LDLCALC 100 11/17/2015   ALT 19 06/07/2016   AST 20 06/07/2016   NA 139 06/14/2016   K 4.3 06/14/2016   CL 105 06/14/2016   CREATININE 1.19 06/14/2016   BUN 22 06/14/2016   CO2 25 06/14/2016   TSH 2.13 06/07/2016   PSA 5.7 11/17/2015   INR 0.96 07/28/2014    Dg Chest 2 View  Result Date: 06/14/2016 CLINICAL DATA:  Follow-up pneumonia, cough with shortness of breath EXAM: CHEST  2 VIEW COMPARISON:  06/07/2016, 04/21/2014, 08/01/2013 FINDINGS: Lungs are hyperinflated. Linear scarring or atelectasis at both bases. Interstitial opacities at the lingula, stable compared with 06/07/2016, slightly increased compared to 04/21/2014. Stable cardiomediastinal silhouette with atherosclerosis. No pneumothorax. Surgical clips in the upper abdomen IMPRESSION: 1. No significant interval change in interstitial opacities overlying the lingula which may reflect acute interstitial inflammation on chronic change 2. Stable bibasilar scarring Electronically Signed   By:  Donavan Foil M.D.   On: 06/14/2016 17:06    Assessment & Plan:   Keane was seen today for cough.  Diagnoses and all orders for this visit:  Need for prophylactic vaccination and inoculation against influenza -     Flu vaccine HIGH DOSE PF (Fluzone High dose)  Community acquired pneumonia of left lower lobe of lung ([place])- his chest x-ray was consistent with scar, there is no evidence of mass or infection. -     DG Chest 2 View; Future  Cough- his chest x-ray and exam are normal, this is consistent with a viral URI. -     DG Chest 2 View; Future  Essential hypertension, benign- his blood pressure is adequately well controlled.   I have discontinued Mr. Akes promethazine-dextromethorphan. I am also having him maintain his ezetimibe, fluticasone, potassium gluconate, thyroid, acyclovir, acidophilus, acetaminophen, testosterone cypionate, DEXILANT, Plant Sterols and Stanols (CHOLEST OFF PO), and Nutritional Supplements (DHEA PO).  No orders of the defined types were placed in this encounter.  Follow-up: Return if symptoms worsen or fail to improve.  Scarlette Calico, MD

## 2016-07-14 ENCOUNTER — Other Ambulatory Visit (HOSPITAL_COMMUNITY): Payer: Medicare Other

## 2016-07-28 ENCOUNTER — Other Ambulatory Visit (HOSPITAL_COMMUNITY): Payer: Medicare Other

## 2016-08-04 ENCOUNTER — Other Ambulatory Visit: Payer: Self-pay | Admitting: Internal Medicine

## 2016-08-22 ENCOUNTER — Other Ambulatory Visit: Payer: Self-pay | Admitting: Internal Medicine

## 2016-08-29 ENCOUNTER — Telehealth: Payer: Self-pay | Admitting: Internal Medicine

## 2016-08-29 NOTE — Telephone Encounter (Signed)
Dr Ronnald Ramp,  We have reached out to patient to get him scheduled for the echo. He has arrived late on one appt and No showed for the last appt. 02/23 at this time we will be removing the order from the work que due to [place] being unable to reach him to get him scheduled.   Davy Pique

## 2016-08-30 ENCOUNTER — Other Ambulatory Visit: Payer: Self-pay | Admitting: Internal Medicine

## 2016-11-29 ENCOUNTER — Other Ambulatory Visit: Payer: Self-pay | Admitting: Internal Medicine

## 2016-12-25 ENCOUNTER — Other Ambulatory Visit: Payer: Self-pay

## 2017-02-26 ENCOUNTER — Other Ambulatory Visit: Payer: Self-pay | Admitting: Internal Medicine

## 2017-02-27 ENCOUNTER — Ambulatory Visit: Payer: BLUE CROSS/BLUE SHIELD | Admitting: Internal Medicine

## 2017-04-09 DIAGNOSIS — H00015 Hordeolum externum left lower eyelid: Secondary | ICD-10-CM | POA: Diagnosis not present

## 2017-04-12 ENCOUNTER — Other Ambulatory Visit: Payer: Self-pay | Admitting: Internal Medicine

## 2017-04-19 ENCOUNTER — Encounter: Payer: Self-pay | Admitting: Family Medicine

## 2017-04-19 ENCOUNTER — Ambulatory Visit (INDEPENDENT_AMBULATORY_CARE_PROVIDER_SITE_OTHER): Payer: Medicare Other | Admitting: Family Medicine

## 2017-04-19 VITALS — BP 120/80 | HR 54 | Temp 98.6°F | Ht 73.0 in | Wt 218.2 lb

## 2017-04-19 DIAGNOSIS — J069 Acute upper respiratory infection, unspecified: Secondary | ICD-10-CM | POA: Diagnosis not present

## 2017-04-19 DIAGNOSIS — J0100 Acute maxillary sinusitis, unspecified: Secondary | ICD-10-CM

## 2017-04-19 DIAGNOSIS — Z23 Encounter for immunization: Secondary | ICD-10-CM | POA: Insufficient documentation

## 2017-04-19 DIAGNOSIS — R590 Localized enlarged lymph nodes: Secondary | ICD-10-CM | POA: Diagnosis not present

## 2017-04-19 DIAGNOSIS — J019 Acute sinusitis, unspecified: Secondary | ICD-10-CM | POA: Insufficient documentation

## 2017-04-19 MED ORDER — AMOXICILLIN-POT CLAVULANATE 875-125 MG PO TABS: 1.0000 | ORAL_TABLET | Freq: Two times a day (BID) | ORAL | 0 refills | Status: DC

## 2017-04-19 NOTE — Progress Notes (Signed)
   Subjective:    Patient ID: [name] Glass, male    DOB: 05-May-1941, 76 y.o.   MRN: [id number]  HPI ongoing history of facial pressure, purulent pnd and swollen gland in neck over the last few weeks. Recently with head ache, sore throat malaise and a nonproductive cough.     Review of Systems  Constitutional: Positive for fatigue. Negative for chills, diaphoresis, fever and unexpected weight change.  HENT: Positive for congestion, dental problem, postnasal drip, rhinorrhea, sinus pressure, sinus pain and sore throat. Negative for hearing loss, trouble swallowing and voice change.   Eyes: Negative.   Respiratory: Positive for cough. Negative for shortness of breath, wheezing and stridor.   Cardiovascular: Negative.   Gastrointestinal: Negative.   Musculoskeletal: Positive for myalgias. Negative for arthralgias.  Skin: Negative for color change and rash.  Neurological: Positive for headaches. Negative for weakness and light-headedness.  Psychiatric/Behavioral: Negative.    Pmh, soc hx and fh reviewed.    Objective:   Physical Exam  Constitutional: He is oriented to person, place, and time. He appears well-developed and well-nourished. No distress.  HENT:  Head: Normocephalic and atraumatic.  Right Ear: External ear normal.  Left Ear: External ear normal.  Mouth/Throat: Oropharynx is clear and moist. No oropharyngeal exudate.  Eyes: Conjunctivae are normal. Pupils are equal, round, and reactive to light. Right eye exhibits no discharge. Left eye exhibits no discharge. No scleral icterus.  Neck: Neck supple. No JVD present. No tracheal deviation present. No thyromegaly present.  Cardiovascular: Normal rate and regular rhythm.  Murmur heard. Pulmonary/Chest: Effort normal and breath sounds normal. No stridor. No respiratory distress. He has no wheezes. He has no rales.  Abdominal: Bowel sounds are normal.  Musculoskeletal: He exhibits no edema or tenderness.  Lymphadenopathy:    He  has no cervical adenopathy.  Neurological: He is alert and oriented to person, place, and time.  Skin: Skin is warm and dry. No rash noted. He is not diaphoretic. No erythema.  Psychiatric: He has a normal mood and affect. His behavior is normal.          Assessment & Plan:

## 2017-04-19 NOTE — Assessment & Plan Note (Signed)
Take abx as directed and use otc cough and cold meds as needed. Fu if not improving in one week.

## 2017-05-03 ENCOUNTER — Other Ambulatory Visit: Payer: Self-pay | Admitting: Internal Medicine

## 2017-05-18 ENCOUNTER — Ambulatory Visit (INDEPENDENT_AMBULATORY_CARE_PROVIDER_SITE_OTHER): Payer: Medicare Other | Admitting: Internal Medicine

## 2017-05-18 ENCOUNTER — Encounter: Payer: Self-pay | Admitting: Internal Medicine

## 2017-05-18 ENCOUNTER — Ambulatory Visit (INDEPENDENT_AMBULATORY_CARE_PROVIDER_SITE_OTHER)
Admission: RE | Admit: 2017-05-18 | Discharge: 2017-05-18 | Disposition: A | Discharge status: Home / Self Care | Payer: Medicare Other | Source: Ambulatory Visit | Attending: Internal Medicine | Admitting: Internal Medicine

## 2017-05-18 ENCOUNTER — Telehealth: Payer: Self-pay | Admitting: Internal Medicine

## 2017-05-18 ENCOUNTER — Other Ambulatory Visit: Payer: Self-pay | Admitting: Internal Medicine

## 2017-05-18 VITALS — BP 130/60 | HR 57 | Temp 98.2°F | Resp 16 | Ht 73.0 in | Wt 221.2 lb

## 2017-05-18 DIAGNOSIS — J0100 Acute maxillary sinusitis, unspecified: Secondary | ICD-10-CM | POA: Diagnosis not present

## 2017-05-18 DIAGNOSIS — R05 Cough: Secondary | ICD-10-CM | POA: Diagnosis not present

## 2017-05-18 DIAGNOSIS — J301 Allergic rhinitis due to pollen: Secondary | ICD-10-CM

## 2017-05-18 DIAGNOSIS — R059 Cough, unspecified: Secondary | ICD-10-CM

## 2017-05-18 MED ORDER — AMOXICILLIN-POT CLAVULANATE 875-125 MG PO TABS: 1.0000 | ORAL_TABLET | Freq: Two times a day (BID) | ORAL | 0 refills | Status: AC

## 2017-05-18 MED ORDER — METHYLPREDNISOLONE 4 MG PO TBPK: ORAL_TABLET | ORAL | 0 refills | Status: AC

## 2017-05-18 NOTE — Patient Instructions (Signed)
Cough, Adult Coughing is a reflex that clears your throat and your airways. Coughing helps to heal and protect your lungs. It is normal to cough occasionally, but a cough that happens with other symptoms or lasts a long time may be a sign of a condition that needs treatment. A cough may last only 2-3 weeks (acute), or it may last longer than 8 weeks (chronic). What are the causes? Coughing is commonly caused by:  Breathing in substances that irritate your lungs.  A viral or bacterial respiratory infection.  Allergies.  Asthma.  Postnasal drip.  Smoking.  Acid backing up from the stomach into the esophagus (gastroesophageal reflux).  Certain medicines.  Chronic lung problems, including COPD (or rarely, lung cancer).  Other medical conditions such as heart failure.  Follow these instructions at home: Pay attention to any changes in your symptoms. Take these actions to help with your discomfort:  Take medicines only as told by your health care provider. ? If you were prescribed an antibiotic medicine, take it as told by your health care provider. Do not stop taking the antibiotic even if you start to feel better. ? Talk with your health care provider before you take a cough suppressant medicine.  Drink enough fluid to keep your urine clear or pale yellow.  If the air is dry, use a cold steam vaporizer or humidifier in your bedroom or your home to help loosen secretions.  Avoid anything that causes you to cough at work or at home.  If your cough is worse at night, try sleeping in a semi-upright position.  Avoid cigarette smoke. If you smoke, quit smoking. If you need help quitting, ask your health care provider.  Avoid caffeine.  Avoid alcohol.  Rest as needed.  Contact a health care provider if:  You have new symptoms.  You cough up pus.  Your cough does not get better after 2-3 weeks, or your cough gets worse.  You cannot control your cough with suppressant  medicines and you are losing sleep.  You develop pain that is getting worse or pain that is not controlled with pain medicines.  You have a fever.  You have unexplained weight loss.  You have night sweats. Get help right away if:  You cough up blood.  You have difficulty breathing.  Your heartbeat is very fast. This information is not intended to replace advice given to you by your health care provider. Make sure you discuss any questions you have with your health care provider. Document Released: 11/18/2010 Document Revised: 10/28/2015 Document Reviewed: 07/29/2014 Elsevier Interactive Patient Education  2017 [employer]  

## 2017-05-18 NOTE — Telephone Encounter (Signed)
Patient would like yearly labs put in before his yearly on 06/26/17.

## 2017-05-18 NOTE — Progress Notes (Signed)
Subjective:  Patient ID: [name] Glass, male    DOB: Jan 22, 1941  Age: 76 y.o. MRN: [id number]  CC: Cough   HPI [name] Glass presents for a 3-week history of cough that is productive of thick yellow phlegm that has not responded to amoxicillin.  He is also had intermittent laryngitis, fatigue, night sweats and shortness of breath.  He denies wheezing, chest pain, hemoptysis, fever, or chills.  Outpatient Medications Prior to Visit  Medication Sig Dispense Refill  . acetaminophen (TYLENOL) 650 MG CR tablet Take 1,300 mg by mouth at bedtime. Reported on 12/16/2015    . acidophilus (RISAQUAD) CAPS capsule Take 1 capsule by mouth 2 (two) times daily.    [name] acyclovir (ZOVIRAX) 200 MG capsule TAKE 1 CAPSULE BY MOUTH THREE TIMES A DAY 270 capsule 1  . DEXILANT 60 MG capsule Take 1 capsule by mouth daily.  1  . ezetimibe (ZETIA) 10 MG tablet Take 1 tablet (10 mg total) by mouth daily. 90 tablet 1  . fluticasone (FLONASE) 50 MCG/ACT nasal spray USE DAILY AS DIRECTED 48 g 1  . Nutritional Supplements (DHEA PO) Take 25 mg by mouth.    . Plant Sterols and Stanols (CHOLEST OFF PO) Take 900 mg by mouth.    . potassium gluconate 595 MG TABS Take 595 mg by mouth every evening.    . testosterone cypionate (DEPOTESTOTERONE CYPIONATE) 100 MG/ML injection Inject into the muscle once a week. For IM use only    . thyroid (ARMOUR) 60 MG tablet Take 60-120 mg by mouth 2 (two) times daily.     [name] amoxicillin-clavulanate (AUGMENTIN) 875-125 MG tablet Take 1 tablet 2 (two) times daily by mouth. 20 tablet 0   No facility-administered medications prior to visit.     ROS Review of Systems  Constitutional: Negative for chills, diaphoresis, fatigue and fever.  HENT: Positive for congestion, postnasal drip, rhinorrhea, sinus pressure, sinus pain and voice change. Negative for facial swelling, nosebleeds, sneezing and sore throat.   Eyes: Negative.   Respiratory: Positive for cough and shortness of breath. Negative  for choking, wheezing and stridor.   Cardiovascular: Negative for chest pain, palpitations and leg swelling.  Gastrointestinal: Negative for abdominal pain, constipation, diarrhea, nausea and vomiting.  Endocrine: Negative.   Genitourinary: Negative.  Negative for difficulty urinating.  Musculoskeletal: Negative.   Skin: Negative.  Negative for rash.  Allergic/Immunologic: Negative.   Neurological: Negative.  Negative for dizziness, weakness and headaches.  Hematological: Negative for adenopathy.  Psychiatric/Behavioral: Negative.     Objective:  BP 130/60 (BP Location: Left Arm, Patient Position: Sitting, Cuff Size: Large)   Pulse (!) 57   Temp 98.2 F (36.8 C) (Oral)   Resp 16   Ht 6\' 1"  (1.854 m)   Wt 221 lb 4 oz (100.4 kg)   SpO2 95%   BMI 29.19 kg/m   BP Readings from Last 3 Encounters:  05/18/17 130/60  04/19/17 120/80  07/13/16 (!) 140/58    Wt Readings from Last 3 Encounters:  05/18/17 221 lb 4 oz (100.4 kg)  04/19/17 218 lb 4 oz (99 kg)  07/13/16 224 lb (101.6 kg)    Physical Exam  Constitutional: He is oriented to person, place, and time. No distress.  HENT:  Nose: Mucosal edema and rhinorrhea present. No epistaxis. Right sinus exhibits maxillary sinus tenderness. Right sinus exhibits no frontal sinus tenderness. Left sinus exhibits maxillary sinus tenderness. Left sinus exhibits no frontal sinus tenderness.  Mouth/Throat: Oropharynx is clear and moist  and mucous membranes are normal. Mucous membranes are not pale, not dry and not cyanotic. No oral lesions. No trismus in the jaw. No uvula swelling. No oropharyngeal exudate, posterior oropharyngeal edema, posterior oropharyngeal erythema or tonsillar abscesses.  Eyes: Conjunctivae are normal. Left eye exhibits no discharge. No scleral icterus.  Neck: Normal range of motion. Neck supple. No JVD present. No thyromegaly present.  Cardiovascular: Normal rate, regular rhythm and normal heart sounds.  No murmur  heard. Pulmonary/Chest: Effort normal and breath sounds normal. No respiratory distress. He has no wheezes. He has no rales.  Abdominal: Soft. Bowel sounds are normal. He exhibits no mass. There is no tenderness.  Musculoskeletal: Normal range of motion. He exhibits no edema or tenderness.  Lymphadenopathy:    He has no cervical adenopathy.  Neurological: He is alert and oriented to person, place, and time.  Skin: Skin is warm and dry. No rash noted. He is not diaphoretic. No erythema. No pallor.  Vitals reviewed.   Lab Results  Component Value Date   WBC 6.8 06/07/2016   HGB 18.8 Repeated and verified X2. (HH) 06/07/2016   HCT 56.3 Repeated and verified X2. (H) 06/07/2016   PLT 281.0 06/07/2016   GLUCOSE 95 06/14/2016   CHOL 168 11/17/2015   TRIG 132 11/17/2015   HDL 42 11/17/2015   LDLCALC 100 11/17/2015   ALT 19 06/07/2016   AST 20 06/07/2016   NA 139 06/14/2016   K 4.3 06/14/2016   CL 105 06/14/2016   CREATININE 1.19 06/14/2016   BUN 22 06/14/2016   CO2 25 06/14/2016   TSH 2.13 06/07/2016   PSA 5.7 11/17/2015   INR 0.96 07/28/2014    Dg Chest 2 View  Result Date: 05/18/2017 CLINICAL DATA:  Cough and congestion EXAM: CHEST  2 VIEW COMPARISON:  Chest radiograph 07/13/2016 FINDINGS: Left basilar atelectasis. No focal consolidation or pulmonary edema. No pneumothorax or pleural effusion. Unchanged mildly enlarged cardiomediastinal silhouette. IMPRESSION: Unchanged left basilar atelectasis. Electronically Signed   By: Ulyses Jarred M.D.   On: 05/18/2017 14:50    Assessment & Plan:   Kamonte was seen today for cough.  Diagnoses and all orders for this visit:  Acute non-recurrent maxillary sinusitis- I will upgrade his antibiotic regimen to Augmentin. -     amoxicillin-clavulanate (AUGMENTIN) 875-125 MG tablet; Take 1 tablet by mouth 2 (two) times daily for 10 days.  Seasonal allergic rhinitis due to pollen- Will treat the inflammation and symptoms with a course of  systemic steroids. -     methylPREDNISolone (MEDROL DOSEPAK) 4 MG TBPK tablet; TAKE AS DIRECTED  Cough- His chest x-ray is negative for pulmonary edema or infiltrate.   I have discontinued Jeannie Fend. Huxford's amoxicillin-clavulanate. I am also having him start on amoxicillin-clavulanate and methylPREDNISolone. Additionally, I am having him maintain his potassium gluconate, thyroid, acidophilus, acetaminophen, testosterone cypionate, DEXILANT, Plant Sterols and Stanols (CHOLEST OFF PO), Nutritional Supplements (DHEA PO), acyclovir, fluticasone, and ezetimibe.  Meds ordered this encounter  Medications  . amoxicillin-clavulanate (AUGMENTIN) 875-125 MG tablet    Sig: Take 1 tablet by mouth 2 (two) times daily for 10 days.    Dispense:  20 tablet    Refill:  0  . methylPREDNISolone (MEDROL DOSEPAK) 4 MG TBPK tablet    Sig: TAKE AS DIRECTED    Dispense:  21 tablet    Refill:  0     Follow-up: No Follow-up on file.  Scarlette Calico, MD

## 2017-05-21 ENCOUNTER — Other Ambulatory Visit: Payer: Self-pay | Admitting: Internal Medicine

## 2017-05-21 DIAGNOSIS — E785 Hyperlipidemia, unspecified: Secondary | ICD-10-CM

## 2017-05-21 DIAGNOSIS — N400 Enlarged prostate without lower urinary tract symptoms: Secondary | ICD-10-CM

## 2017-05-21 DIAGNOSIS — I10 Essential (primary) hypertension: Secondary | ICD-10-CM

## 2017-05-21 DIAGNOSIS — N182 Chronic kidney disease, stage 2 (mild): Secondary | ICD-10-CM

## 2017-05-21 DIAGNOSIS — I2510 Atherosclerotic heart disease of native coronary artery without angina pectoris: Secondary | ICD-10-CM

## 2017-05-21 NOTE — Telephone Encounter (Signed)
Labs ordered.

## 2017-05-21 NOTE — Telephone Encounter (Signed)
Patient has been inform. 

## 2017-06-11 ENCOUNTER — Telehealth: Payer: Self-pay | Admitting: Internal Medicine

## 2017-06-11 NOTE — Telephone Encounter (Signed)
noted 

## 2017-06-11 NOTE — Telephone Encounter (Signed)
Copied from [place] [id number]. Topic: Quick Communication - See Telephone Encounter >> Jun 11, 2017 11:38 AM Oneta Rack wrote: CRM for notification. See Telephone encounter for:   06/11/17.  Relation to pt: self   Call back number: [phone] Pharmacy:  Reason for call:  Patient would like to discuss scheduling surgery regarding his hernia and eye surgery regarding his eye lipids, patient states PCP is aware, please advise

## 2017-06-22 ENCOUNTER — Other Ambulatory Visit (INDEPENDENT_AMBULATORY_CARE_PROVIDER_SITE_OTHER): Payer: Medicare Other

## 2017-06-22 ENCOUNTER — Telehealth: Payer: Self-pay

## 2017-06-22 DIAGNOSIS — I10 Essential (primary) hypertension: Secondary | ICD-10-CM | POA: Diagnosis not present

## 2017-06-22 DIAGNOSIS — N182 Chronic kidney disease, stage 2 (mild): Secondary | ICD-10-CM | POA: Diagnosis not present

## 2017-06-22 DIAGNOSIS — I2510 Atherosclerotic heart disease of native coronary artery without angina pectoris: Secondary | ICD-10-CM

## 2017-06-22 DIAGNOSIS — E785 Hyperlipidemia, unspecified: Secondary | ICD-10-CM | POA: Diagnosis not present

## 2017-06-22 DIAGNOSIS — N400 Enlarged prostate without lower urinary tract symptoms: Secondary | ICD-10-CM

## 2017-06-22 LAB — LIPID PANEL
CHOL/HDL RATIO: 4
CHOLESTEROL: 181 mg/dL (ref 0–200)
HDL: 42.7 mg/dL (ref >39.00)
LDL CALC: 111 mg/dL — AB (ref 0–99)
NonHDL: 138.01
Triglycerides: 137 mg/dL (ref 0.0–149.0)
VLDL: 27.4 mg/dL (ref 0.0–40.0)

## 2017-06-22 LAB — CBC WITH DIFFERENTIAL/PLATELET
BASOS ABS: 0.1 10*3/uL (ref 0.0–0.1)
Basophils Relative: 1 % (ref 0.0–3.0)
EOS ABS: 0.1 10*3/uL (ref 0.0–0.7)
Eosinophils Relative: 2.4 % (ref 0.0–5.0)
HCT: 52.2 % — ABNORMAL HIGH (ref 39.0–52.0)
HEMOGLOBIN: 17 g/dL (ref 13.0–17.0)
Lymphocytes Relative: 43.5 % (ref 12.0–46.0)
Lymphs Abs: 2.3 10*3/uL (ref 0.7–4.0)
MCHC: 32.6 g/dL (ref 30.0–36.0)
MCV: 89.6 fl (ref 78.0–100.0)
MONO ABS: 1 10*3/uL (ref 0.1–1.0)
Monocytes Relative: 19.6 % — ABNORMAL HIGH (ref 3.0–12.0)
Neutro Abs: 1.8 10*3/uL (ref 1.4–7.7)
Neutrophils Relative %: 33.5 % — ABNORMAL LOW (ref 43.0–77.0)
Platelets: 367 10*3/uL (ref 150.0–400.0)
RBC: 5.83 Mil/uL — AB (ref 4.22–5.81)
RDW: 15.8 % — ABNORMAL HIGH (ref 11.5–15.5)
WBC: 5.3 10*3/uL (ref 4.0–10.5)

## 2017-06-22 LAB — URINALYSIS, ROUTINE W REFLEX MICROSCOPIC
BILIRUBIN URINE: NEGATIVE
HGB URINE DIPSTICK: NEGATIVE
LEUKOCYTES UA: NEGATIVE
NITRITE: NEGATIVE
RBC / HPF: NONE SEEN (ref 0–?)
Specific Gravity, Urine: 1.025 (ref 1.000–1.030)
Total Protein, Urine: 100 — AB
Urine Glucose: NEGATIVE
Urobilinogen, UA: 0.2 (ref 0.0–1.0)
pH: 6 (ref 5.0–8.0)

## 2017-06-22 LAB — COMPREHENSIVE METABOLIC PANEL
ALBUMIN: 4.2 g/dL (ref 3.5–5.2)
ALT: 14 U/L (ref 0–53)
AST: 16 U/L (ref 0–37)
Alkaline Phosphatase: 51 U/L (ref 39–117)
BUN: 17 mg/dL (ref 6–23)
CHLORIDE: 103 meq/L (ref 96–112)
CO2: 30 mEq/L (ref 19–32)
CREATININE: 1.2 mg/dL (ref 0.40–1.50)
Calcium: 10.2 mg/dL (ref 8.4–10.5)
GFR: 62.4 mL/min (ref >60.00)
Glucose, Bld: 102 mg/dL — ABNORMAL HIGH (ref 70–99)
Potassium: 4.5 mEq/L (ref 3.5–5.1)
SODIUM: 142 meq/L (ref 135–145)
Total Bilirubin: 0.7 mg/dL (ref 0.2–1.2)
Total Protein: 6.6 g/dL (ref 6.0–8.3)

## 2017-06-22 LAB — TSH: TSH: 0.8 u[IU]/mL (ref 0.35–4.50)

## 2017-06-22 LAB — PSA: PSA: 11.54 ng/mL — AB (ref 0.10–4.00)

## 2017-06-22 NOTE — Telephone Encounter (Signed)
error 

## 2017-06-25 ENCOUNTER — Encounter: Payer: Self-pay | Admitting: Internal Medicine

## 2017-06-26 ENCOUNTER — Encounter: Payer: Self-pay | Admitting: Internal Medicine

## 2017-06-26 ENCOUNTER — Ambulatory Visit (INDEPENDENT_AMBULATORY_CARE_PROVIDER_SITE_OTHER): Payer: Medicare Other | Admitting: Internal Medicine

## 2017-06-26 VITALS — BP 130/78 | HR 58 | Temp 97.9°F | Resp 16 | Ht 73.0 in | Wt 221.1 lb

## 2017-06-26 DIAGNOSIS — Z0000 Encounter for general adult medical examination without abnormal findings: Secondary | ICD-10-CM | POA: Diagnosis not present

## 2017-06-26 DIAGNOSIS — N182 Chronic kidney disease, stage 2 (mild): Secondary | ICD-10-CM | POA: Diagnosis not present

## 2017-06-26 DIAGNOSIS — I2510 Atherosclerotic heart disease of native coronary artery without angina pectoris: Secondary | ICD-10-CM | POA: Diagnosis not present

## 2017-06-26 DIAGNOSIS — R221 Localized swelling, mass and lump, neck: Secondary | ICD-10-CM | POA: Diagnosis not present

## 2017-06-26 DIAGNOSIS — Z23 Encounter for immunization: Secondary | ICD-10-CM | POA: Diagnosis not present

## 2017-06-26 DIAGNOSIS — E785 Hyperlipidemia, unspecified: Secondary | ICD-10-CM

## 2017-06-26 DIAGNOSIS — R972 Elevated prostate specific antigen [PSA]: Secondary | ICD-10-CM | POA: Diagnosis not present

## 2017-06-26 MED ORDER — PITAVASTATIN CALCIUM 1 MG PO TABS: 1.0000 | ORAL_TABLET | Freq: Every day | ORAL | 1 refills | Status: DC

## 2017-06-26 NOTE — Patient Instructions (Signed)

## 2017-06-26 NOTE — Progress Notes (Signed)
Subjective:  Patient ID: [name] Glass, male    DOB: 1940-07-21  Age: 77 y.o. MRN: [id number]  CC: Annual Exam; Hyperlipidemia; and Hypothyroidism   HPI [name] presents for a CPX.  He complains of a persistently enlarged and slightly uncomfortable lymph node on the left side of the neck.  When I last saw him he tried a course of antibiotics but he says that it did not help.  He continues to be active and denies any recent episodes of CP, DOE, palpitations, edema, or fatigue.  Past Medical History:  Diagnosis Date  . Arthritis   . Coronary artery disease   . GERD (gastroesophageal reflux disease)   . H/O bronchitis   . H/O hiatal hernia   . History of kidney stones last in 1976  . Hyperlipidemia    statin intolerant, under control  . Hypertension    under control  . Hypothyroidism   . Sleep apnea    hx of, surgery to reverse   Past Surgical History:  Procedure Laterality Date  . CARDIAC CATHETERIZATION Right 2008   5 heart stents  . CATARACT EXTRACTION Bilateral 6 years ago  . CYSTOSCOPY N/A 08/06/2012   Procedure: CYSTOSCOPY;  Surgeon: [name], MD;  Location: [facility];  Service: Urology;  Laterality: N/A;  . ESOPHAGOGASTRIC FUNDOPLICATION  5643   spleen removed, 5 pints of blood given  . HERNIA REPAIR  1985   x4  . INGUINAL HERNIA REPAIR  1992  . PROSTATE ABLATION  2013   "laser ablation"  . TONSILLECTOMY  as child  . TOTAL KNEE ARTHROPLASTY Right 08/03/2014   Procedure: TOTAL RIGHT KNEE ARTHROPLASTY;  Surgeon: [name], MD;  Location: [facility];  Service: Orthopedics;  Laterality: Right;  . TRANSURETHRAL RESECTION OF PROSTATE N/A 08/06/2012   Procedure: Almyra Free OF PROSTATE WITH GYRUS ;  Surgeon: [name], MD;  Location: [facility];  Service: Urology;  Laterality: N/A;  . UMBILICAL HERNIA REPAIR  1990  . UVULECTOMY  2005   for sleep apnea  . UVULOPALATOPHARYNGOPLASTY  6 years ago    reports that  has never smoked. he has never used smokeless  tobacco. He reports that he drinks about 2.4 oz of alcohol per week. He reports that he does not use drugs. family history includes Alzheimer's disease in his maternal grandmother and mother; Cancer in his maternal grandfather, maternal grandmother, mother, paternal grandfather, and paternal grandmother; Diabetes in his paternal grandfather; Heart attack (age of onset: 35) in his father; Stroke in his mother. Allergies  Allergen Reactions  . Morphine And Related     Had "crazy dreams" felt "crazy" per pt.  . Bactrim [Sulfamethoxazole-Trimethoprim]   . Statins     Outpatient Medications Prior to Visit  Medication Sig Dispense Refill  . acetaminophen (TYLENOL) 650 MG CR tablet Take 1,300 mg by mouth at bedtime. Reported on 12/16/2015    . acidophilus (RISAQUAD) CAPS capsule Take 1 capsule by mouth 2 (two) times daily.    [name] acyclovir (ZOVIRAX) 200 MG capsule TAKE 1 CAPSULE BY MOUTH THREE TIMES A DAY 270 capsule 1  . anastrozole (ARIMIDEX) 1 MG tablet TAKE 1 TABLET 4 TIMES A WEEK    . DEXILANT 60 MG capsule Take 1 capsule by mouth daily.  1  . ezetimibe (ZETIA) 10 MG tablet Take 1 tablet (10 mg total) by mouth daily. 90 tablet 1  . ezetimibe (ZETIA) 10 MG tablet TAKE 1 TABLET BY MOUTH EVERY DAY    .  fluticasone (FLONASE) 50 MCG/ACT nasal spray USE DAILY AS DIRECTED 48 g 1  . liothyronine (CYTOMEL) 25 MCG tablet TAKE 1 TABLET UPON WAKING.  3  . Nutritional Supplements (DHEA PO) Take 25 mg by mouth.    . Plant Sterols and Stanols (CHOLEST OFF PO) Take 900 mg by mouth.    . potassium gluconate 595 MG TABS Take 595 mg by mouth every evening.    . testosterone cypionate (DEPOTESTOTERONE CYPIONATE) 100 MG/ML injection Inject into the muscle once a week. For IM use only    . thyroid (ARMOUR) 60 MG tablet Take 60-120 mg by mouth 2 (two) times daily.      No facility-administered medications prior to visit.     ROS Review of Systems  Constitutional: Negative.  Negative for appetite change,  diaphoresis, fatigue and unexpected weight change.  HENT: Negative.   Eyes: Negative for visual disturbance.  Respiratory: Negative for cough, chest tightness, shortness of breath and wheezing.   Cardiovascular: Negative for chest pain, palpitations and leg swelling.  Gastrointestinal: Negative.  Negative for abdominal pain, constipation, diarrhea, nausea and vomiting.  Endocrine: Negative.   Genitourinary: Negative for difficulty urinating, dysuria, hematuria, penile swelling, scrotal swelling, testicular pain and urgency.  Musculoskeletal: Positive for arthralgias. Negative for back pain and myalgias.  Skin: Negative.  Negative for color change and rash.  Neurological: Negative.  Negative for dizziness, syncope and light-headedness.  Hematological: Negative for adenopathy. Does not bruise/bleed easily.  Psychiatric/Behavioral: Negative.  Negative for dysphoric mood and sleep disturbance. The patient is not nervous/anxious.     Objective:  BP 130/78 (BP Location: Left Arm, Patient Position: Sitting, Cuff Size: Normal)   Pulse (!) 58   Temp 97.9 F (36.6 C) (Oral)   Resp 16   Ht 6\' 1"  (1.854 m)   Wt 221 lb 1.3 oz (100.3 kg)   SpO2 97%   BMI 29.17 kg/m   BP Readings from Last 3 Encounters:  06/26/17 130/78  05/18/17 130/60  04/19/17 120/80    Wt Readings from Last 3 Encounters:  06/26/17 221 lb 1.3 oz (100.3 kg)  05/18/17 221 lb 4 oz (100.4 kg)  04/19/17 218 lb 4 oz (99 kg)    Physical Exam  Constitutional: He is oriented to person, place, and time. No distress.  HENT:  Mouth/Throat: Oropharynx is clear and moist. No oropharyngeal exudate.  Eyes: Conjunctivae are normal. Left eye exhibits no discharge. No scleral icterus.  Neck: Normal range of motion. Neck supple. No JVD present. Tracheal tenderness present. No thyroid mass and no thyromegaly present.  Cardiovascular: Normal rate, regular rhythm and normal heart sounds. Exam reveals no gallop.  No murmur  heard. Pulmonary/Chest: Effort normal and breath sounds normal. No respiratory distress. He has no wheezes. He has no rales.  Abdominal: Soft. Bowel sounds are normal. He exhibits no distension and no mass. There is no tenderness.  Genitourinary:  Genitourinary Comments: GU and rectal exams were deferred at his request.  He wants this to be done by a urologist.  Musculoskeletal: Normal range of motion. He exhibits no edema, tenderness or deformity.  Lymphadenopathy:    He has cervical adenopathy.       Right cervical: No superficial cervical, no deep cervical and no posterior cervical adenopathy present.      Left cervical: Superficial cervical adenopathy present. No deep cervical and no posterior cervical adenopathy present.       Right axillary: No pectoral and no lateral adenopathy present.  Left axillary: No pectoral and no lateral adenopathy present.      Right: No supraclavicular adenopathy present.       Left: No supraclavicular adenopathy present.  On the left side there is a 3 x 4 cm enlarged lymph node that is asymmetrical compared to the right side.  It is slightly tender but non-fixed and nonfluctuant.  Neurological: He is alert and oriented to person, place, and time.  Skin: Skin is warm and dry. No rash noted. He is not diaphoretic. No erythema. No pallor.  Psychiatric: He has a normal mood and affect. His behavior is normal. Judgment and thought content normal.  Vitals reviewed.   Lab Results  Component Value Date   WBC 5.3 06/22/2017   HGB 17.0 06/22/2017   HCT 52.2 (H) 06/22/2017   PLT 367.0 06/22/2017   GLUCOSE 102 (H) 06/22/2017   CHOL 181 06/22/2017   TRIG 137.0 06/22/2017   HDL 42.70 06/22/2017   LDLCALC 111 (H) 06/22/2017   ALT 14 06/22/2017   AST 16 06/22/2017   NA 142 06/22/2017   K 4.5 06/22/2017   CL 103 06/22/2017   CREATININE 1.20 06/22/2017   BUN 17 06/22/2017   CO2 30 06/22/2017   TSH 0.80 06/22/2017   PSA 11.54 (H) 06/22/2017   INR 0.96  07/28/2014    Dg Chest 2 View  Result Date: 05/18/2017 CLINICAL DATA:  Cough and congestion EXAM: CHEST  2 VIEW COMPARISON:  Chest radiograph 07/13/2016 FINDINGS: Left basilar atelectasis. No focal consolidation or pulmonary edema. No pneumothorax or pleural effusion. Unchanged mildly enlarged cardiomediastinal silhouette. IMPRESSION: Unchanged left basilar atelectasis. Electronically Signed   By: Ulyses Jarred M.D.   On: 05/18/2017 14:50    Assessment & Plan:   Khye was seen today for annual exam, hyperlipidemia and hypothyroidism.  Diagnoses and all orders for this visit:  PSA elevation-his PSA has risen significantly over the last year.  I have asked him to see a urologist to have this evaluated further. -     Ambulatory referral to Urology  Hyperlipidemia with target LDL less than 70- He has not achieved his LDL goal and does have significant cardiovascular risk.  I have asked him to start taking a low-dose of a statin for CV risk reduction. -     Pitavastatin Calcium (LIVALO) 1 MG TABS; Take 1 tablet (1 mg total) by mouth daily.  Chronic renal disease, stage 2, mildly decreased glomerular filtration rate (GFR) between 60-89 mL/min/1.73 square meter- He agrees to avoid nephrotoxic agents.  Mass of left side of neck -     CT Soft Tissue Neck W Contrast; Future  Localized swelling, mass or lump of neck- He has a symptomatic lymph node and elevated monocytes on his CBC.  I will get a CT scan with contrast of the area to see if there is concern for malignancy. -     CT Soft Tissue Neck W Contrast; Future  Need for vaccine for Td (tetanus-diphtheria) -     Tdap vaccine greater than or equal to 7yo IM  Need for vaccination with 13-polyvalent pneumococcal conjugate vaccine -     Pneumococcal conjugate vaccine 13-valent IM   I am having [name] start on Pitavastatin Calcium. I am also having him maintain his potassium gluconate, thyroid, acidophilus, acetaminophen,  testosterone cypionate, DEXILANT, Plant Sterols and Stanols (CHOLEST OFF PO), Nutritional Supplements (DHEA PO), acyclovir, fluticasone, ezetimibe, ezetimibe, liothyronine, and anastrozole.  Meds ordered this encounter  Medications  . Pitavastatin  Calcium (LIVALO) 1 MG TABS    Sig: Take 1 tablet (1 mg total) by mouth daily.    Dispense:  90 tablet    Refill:  1   See AVS for instructions about healthy living and anticipatory guidance.  Follow-up: Return in about 3 months (around 09/24/2017).  Scarlette Calico, MD

## 2017-06-29 DIAGNOSIS — Z0000 Encounter for general adult medical examination without abnormal findings: Secondary | ICD-10-CM | POA: Insufficient documentation

## 2017-06-29 NOTE — Assessment & Plan Note (Signed)

## 2017-07-05 ENCOUNTER — Other Ambulatory Visit: Payer: Medicare Other

## 2017-07-06 ENCOUNTER — Ambulatory Visit (INDEPENDENT_AMBULATORY_CARE_PROVIDER_SITE_OTHER)
Admission: RE | Admit: 2017-07-06 | Discharge: 2017-07-06 | Disposition: A | Discharge status: Home / Self Care | Payer: Medicare Other | Source: Ambulatory Visit | Attending: Internal Medicine | Admitting: Internal Medicine

## 2017-07-06 DIAGNOSIS — R221 Localized swelling, mass and lump, neck: Secondary | ICD-10-CM | POA: Diagnosis not present

## 2017-07-06 MED ORDER — IOPAMIDOL (ISOVUE-300) INJECTION 61%
75.0000 mL | Freq: Once | INTRAVENOUS | Status: AC | PRN
  Administered 2017-07-06: 75 mL via INTRAVENOUS

## 2017-07-10 ENCOUNTER — Other Ambulatory Visit: Payer: Self-pay | Admitting: Internal Medicine

## 2017-07-20 DIAGNOSIS — R070 Pain in throat: Secondary | ICD-10-CM | POA: Diagnosis not present

## 2017-07-20 DIAGNOSIS — I10 Essential (primary) hypertension: Secondary | ICD-10-CM | POA: Diagnosis not present

## 2017-07-20 DIAGNOSIS — R1312 Dysphagia, oropharyngeal phase: Secondary | ICD-10-CM | POA: Diagnosis not present

## 2017-07-20 DIAGNOSIS — R072 Precordial pain: Secondary | ICD-10-CM | POA: Diagnosis not present

## 2017-07-20 DIAGNOSIS — R1310 Dysphagia, unspecified: Secondary | ICD-10-CM | POA: Diagnosis not present

## 2017-07-20 DIAGNOSIS — K219 Gastro-esophageal reflux disease without esophagitis: Secondary | ICD-10-CM | POA: Diagnosis not present

## 2017-07-20 DIAGNOSIS — R079 Chest pain, unspecified: Secondary | ICD-10-CM | POA: Diagnosis not present

## 2017-07-20 DIAGNOSIS — E039 Hypothyroidism, unspecified: Secondary | ICD-10-CM | POA: Diagnosis not present

## 2017-07-20 DIAGNOSIS — M7989 Other specified soft tissue disorders: Secondary | ICD-10-CM | POA: Diagnosis not present

## 2017-07-20 DIAGNOSIS — R130 Aphagia: Secondary | ICD-10-CM | POA: Diagnosis not present

## 2017-07-20 DIAGNOSIS — R001 Bradycardia, unspecified: Secondary | ICD-10-CM | POA: Diagnosis not present

## 2017-07-20 DIAGNOSIS — I2510 Atherosclerotic heart disease of native coronary artery without angina pectoris: Secondary | ICD-10-CM | POA: Diagnosis not present

## 2017-07-20 DIAGNOSIS — R011 Cardiac murmur, unspecified: Secondary | ICD-10-CM | POA: Diagnosis not present

## 2017-07-21 DIAGNOSIS — R079 Chest pain, unspecified: Secondary | ICD-10-CM | POA: Diagnosis not present

## 2017-07-21 DIAGNOSIS — R1312 Dysphagia, oropharyngeal phase: Secondary | ICD-10-CM | POA: Diagnosis not present

## 2017-07-21 DIAGNOSIS — I348 Other nonrheumatic mitral valve disorders: Secondary | ICD-10-CM | POA: Diagnosis not present

## 2017-07-21 DIAGNOSIS — I2510 Atherosclerotic heart disease of native coronary artery without angina pectoris: Secondary | ICD-10-CM | POA: Diagnosis not present

## 2017-07-21 DIAGNOSIS — I10 Essential (primary) hypertension: Secondary | ICD-10-CM | POA: Diagnosis not present

## 2017-07-21 LAB — BASIC METABOLIC PANEL
BUN: 17 (ref 4–21)
CREATININE: 1 (ref 0.6–1.3)
GLUCOSE: 109
POTASSIUM: 4.1 (ref 3.4–5.3)
SODIUM: 141 (ref 137–147)

## 2017-07-21 LAB — LIPID PANEL
HDL: 35 (ref 35–70)
LDL Cholesterol: 79
Triglycerides: 166 — AB (ref 40–160)

## 2017-07-21 LAB — CBC AND DIFFERENTIAL
HEMATOCRIT: 49 (ref 41–53)
Hemoglobin: 16 (ref 13.5–17.5)
PLATELETS: 300 (ref 150–399)
WBC: 6.1

## 2017-07-21 LAB — TSH: TSH: 0.04 — AB (ref 0.41–5.90)

## 2017-07-21 LAB — HEMOGLOBIN A1C: Hemoglobin A1C: 6.1

## 2017-07-30 ENCOUNTER — Encounter: Payer: Self-pay | Admitting: Gastroenterology

## 2017-07-30 ENCOUNTER — Encounter: Payer: Self-pay | Admitting: Internal Medicine

## 2017-07-30 ENCOUNTER — Ambulatory Visit (INDEPENDENT_AMBULATORY_CARE_PROVIDER_SITE_OTHER): Payer: Medicare Other | Admitting: Internal Medicine

## 2017-07-30 ENCOUNTER — Other Ambulatory Visit: Payer: Medicare Other

## 2017-07-30 ENCOUNTER — Telehealth: Payer: Self-pay

## 2017-07-30 VITALS — BP 110/60 | HR 59 | Temp 97.9°F | Resp 16 | Ht 73.0 in | Wt 221.0 lb

## 2017-07-30 DIAGNOSIS — H11433 Conjunctival hyperemia, bilateral: Secondary | ICD-10-CM | POA: Diagnosis not present

## 2017-07-30 DIAGNOSIS — R972 Elevated prostate specific antigen [PSA]: Secondary | ICD-10-CM | POA: Diagnosis not present

## 2017-07-30 DIAGNOSIS — R1312 Dysphagia, oropharyngeal phase: Secondary | ICD-10-CM | POA: Diagnosis not present

## 2017-07-30 DIAGNOSIS — H02834 Dermatochalasis of left upper eyelid: Secondary | ICD-10-CM | POA: Diagnosis not present

## 2017-07-30 DIAGNOSIS — H0279 Other degenerative disorders of eyelid and periocular area: Secondary | ICD-10-CM | POA: Diagnosis not present

## 2017-07-30 DIAGNOSIS — I10 Essential (primary) hypertension: Secondary | ICD-10-CM

## 2017-07-30 DIAGNOSIS — H02413 Mechanical ptosis of bilateral eyelids: Secondary | ICD-10-CM | POA: Diagnosis not present

## 2017-07-30 DIAGNOSIS — H02115 Cicatricial ectropion of left lower eyelid: Secondary | ICD-10-CM | POA: Diagnosis not present

## 2017-07-30 DIAGNOSIS — I2510 Atherosclerotic heart disease of native coronary artery without angina pectoris: Secondary | ICD-10-CM

## 2017-07-30 DIAGNOSIS — H02423 Myogenic ptosis of bilateral eyelids: Secondary | ICD-10-CM | POA: Diagnosis not present

## 2017-07-30 DIAGNOSIS — H02532 Eyelid retraction right lower eyelid: Secondary | ICD-10-CM | POA: Diagnosis not present

## 2017-07-30 DIAGNOSIS — H53483 Generalized contraction of visual field, bilateral: Secondary | ICD-10-CM | POA: Diagnosis not present

## 2017-07-30 DIAGNOSIS — H02535 Eyelid retraction left lower eyelid: Secondary | ICD-10-CM | POA: Diagnosis not present

## 2017-07-30 DIAGNOSIS — H02831 Dermatochalasis of right upper eyelid: Secondary | ICD-10-CM | POA: Diagnosis not present

## 2017-07-30 MED ORDER — ASPIRIN EC 81 MG PO TBEC: 81.0000 mg | DELAYED_RELEASE_TABLET | Freq: Every day | ORAL | 3 refills | Status: DC

## 2017-07-30 NOTE — Patient Instructions (Signed)

## 2017-07-30 NOTE — Progress Notes (Signed)
Subjective:  Patient ID: [name] Glass, male    DOB: 1940/10/06  Age: 77 y.o. MRN: [id number]  CC: Coronary Artery Disease   HPI [name] Glass presents for f/up - He was in [place] about 10 days ago and had a strange episode of throat and neck pain with trouble swallowing and laryngitis.  He also had some respiratory symptoms at the time.  He ended up getting admitted to a hospital near [place].  He underwent an extensive workup.  A CT scan of his neck was normal.  Chest x-ray was normal.  He had an echocardiogram that was unremarkable.  He had a nuclear medicine test of his heart that was also unremarkable.  In many ways he feels better but he continues to complain of episodes of difficulty swallowing and laryngitis.  He was told to start aspirin 81 mg a day but he has neglected to do so at this point.  He is doing well on Livalo with no muscle or joint aches.  His LDL in [place] was down to 79.  He also wants to to recheck his PSA level today.  His TSH was suppressed down to 0.04.  I informed him of this and told him to tell his doctor in [place] who prescribes his thyroid replacement therapy that he needs to lower his dose.  Outpatient Medications Prior to Visit  Medication Sig Dispense Refill  . acetaminophen (TYLENOL) 650 MG CR tablet Take 1,300 mg by mouth at bedtime. Reported on 12/16/2015    . acidophilus (RISAQUAD) CAPS capsule Take 1 capsule by mouth 2 (two) times daily.    [name] acyclovir (ZOVIRAX) 200 MG capsule acyclovir 200 mg capsule    . anastrozole (ARIMIDEX) 1 MG tablet TAKE 1 TABLET 4 TIMES A WEEK    . DEXILANT 60 MG capsule Take 1 capsule by mouth daily.  1  . ezetimibe (ZETIA) 10 MG tablet Take 1 tablet (10 mg total) by mouth daily. 90 tablet 1  . fluticasone (FLONASE) 50 MCG/ACT nasal spray USE DAILY AS DIRECTED 48 g 1  . liothyronine (CYTOMEL) 25 MCG tablet TAKE 1 TABLET UPON WAKING.  3  . NON FORMULARY Decoanate Injection    . Nutritional Supplements (DHEA PO) Take 25 mg by  mouth.    . Pitavastatin Calcium (LIVALO) 1 MG TABS Take 1 tablet (1 mg total) by mouth daily. 90 tablet 1  . Plant Sterols and Stanols (CHOLEST OFF PO) Take 900 mg by mouth.    . potassium gluconate 595 MG TABS Take 595 mg by mouth every evening.    . progesterone (PROMETRIUM) 100 MG capsule progesterone micronized 100 mg capsule    . tamsulosin (FLOMAX) 0.4 MG CAPS capsule tamsulosin 0.4 mg capsule    . testosterone cypionate (DEPOTESTOTERONE CYPIONATE) 100 MG/ML injection Inject into the muscle once a week. For IM use only    . thyroid (ARMOUR) 60 MG tablet Take 60-120 mg by mouth 2 (two) times daily.     [name] acyclovir (ZOVIRAX) 200 MG capsule TAKE 1 CAPSULE BY MOUTH THREE TIMES A DAY 270 capsule 1  . ezetimibe (ZETIA) 10 MG tablet TAKE 1 TABLET BY MOUTH EVERY DAY     No facility-administered medications prior to visit.     ROS Review of Systems  Constitutional: Negative for appetite change, diaphoresis and fatigue.  HENT: Positive for trouble swallowing and voice change. Negative for sore throat.   Cardiovascular: Negative for chest pain, palpitations and leg swelling.  Gastrointestinal: Negative.  Negative for abdominal pain, constipation, diarrhea, nausea and vomiting.  Endocrine: Negative.  Negative for cold intolerance and heat intolerance.  Genitourinary: Negative for difficulty urinating, dysuria and hematuria.  Musculoskeletal: Negative.  Negative for back pain, myalgias and neck pain.  Skin: Negative.  Negative for color change and rash.  Allergic/Immunologic: Negative.   Neurological: Negative.  Negative for dizziness, weakness and light-headedness.  Hematological: Negative for adenopathy. Does not bruise/bleed easily.  Psychiatric/Behavioral: Negative.  Negative for dysphoric mood and sleep disturbance. The patient is not nervous/anxious.     Objective:  BP 110/60 (BP Location: Left Arm, Patient Position: Sitting, Cuff Size: Large)   Pulse (!) 59   Temp 97.9 F (36.6 C)  (Oral)   Resp 16   Ht 6\' 1"  (1.854 m)   Wt 221 lb (100.2 kg)   SpO2 97%   BMI 29.16 kg/m   BP Readings from Last 3 Encounters:  07/30/17 110/60  06/26/17 130/78  05/18/17 130/60    Wt Readings from Last 3 Encounters:  07/30/17 221 lb (100.2 kg)  06/26/17 221 lb 1.3 oz (100.3 kg)  05/18/17 221 lb 4 oz (100.4 kg)    Physical Exam  Constitutional: He is oriented to person, place, and time. No distress.  HENT:  Mouth/Throat: Mucous membranes are normal. Mucous membranes are not pale, not dry and not cyanotic. No uvula swelling. Posterior oropharyngeal erythema present. No oropharyngeal exudate, posterior oropharyngeal edema or tonsillar abscesses.  Eyes: Conjunctivae are normal. Left eye exhibits no discharge. No scleral icterus.  Neck: Normal range of motion. Neck supple. No JVD present. No thyromegaly present.  Cardiovascular: Normal rate and regular rhythm. Exam reveals no gallop and no friction rub.  Murmur (1/6 SEM) heard. Pulmonary/Chest: Effort normal and breath sounds normal. No respiratory distress. He has no wheezes. He has no rales.  Abdominal: Soft. Bowel sounds are normal. He exhibits no distension and no mass. There is no tenderness. There is no guarding.  Musculoskeletal: Normal range of motion. He exhibits no edema or tenderness.  Lymphadenopathy:    He has no cervical adenopathy.  Neurological: He is alert and oriented to person, place, and time.  Skin: Skin is warm and dry. No rash noted. He is not diaphoretic. No erythema. No pallor.  Psychiatric: He has a normal mood and affect. His behavior is normal. Judgment and thought content normal.  Vitals reviewed.   Lab Results  Component Value Date   WBC 6.1 07/21/2017   HGB 16.0 07/21/2017   HCT 49 07/21/2017   PLT 300 07/21/2017   GLUCOSE 102 (H) 06/22/2017   CHOL 181 06/22/2017   TRIG 166 (A) 07/21/2017   HDL 35 07/21/2017   LDLCALC 79 07/21/2017   ALT 14 06/22/2017   AST 16 06/22/2017   NA 141  07/21/2017   K 4.1 07/21/2017   CL 103 06/22/2017   CREATININE 1.0 07/21/2017   BUN 17 07/21/2017   CO2 30 06/22/2017   TSH 0.04 (A) 07/21/2017   PSA 11.54 (H) 06/22/2017   INR 0.96 07/28/2014   HGBA1C 6.1 07/21/2017    Ct Soft Tissue Neck W Contrast  Result Date: 07/06/2017 CLINICAL DATA:  Initial evaluation for nontender anterior neck mass. EXAM: CT NECK WITH CONTRAST TECHNIQUE: Multidetector CT imaging of the neck was performed using the standard protocol following the bolus administration of intravenous contrast. CONTRAST:  3mL ISOVUE-300 IOPAMIDOL (ISOVUE-300) INJECTION 61% COMPARISON:  None available. FINDINGS: Pharynx and larynx: Oral cavity within normal limits without mass lesion or loculated  fluid collection. No acute abnormality about the dentition. Palatine tonsils symmetric and within normal limits. Parapharyngeal fat preserved. Nasopharynx normal. Retropharyngeal soft tissues normal. Epiglottis within normal limits. Vallecula clear. Remainder of the hypopharynx and supraglottic larynx within normal limits. True cords apposed and not well evaluated. Subglottic airway clear. Salivary glands: Metallic BB marker overlies the left anterior neck at the palpable area concern. This overlies the left submandibular gland, which is positioned immediately subjacent to the marker BB. The gland itself is normal in appearance without mass lesion or acute inflammatory changes. No other mass lesion or other abnormality within this region to correspond with palpable abnormality. Remainder of the salivary glands including the parotid and right submandibular gland are normal. Thyroid: Thyroid normal. Lymph nodes: No pathologically enlarged lymph nodes identified within the neck. Vascular: Normal intravascular enhancement seen throughout the neck. Scattered atherosclerotic plaque about the carotid bifurcations and aortic arch noted. Limited intracranial: Unremarkable. Visualized orbits: Partially visualized  globes and orbital soft tissues within normal limits. Patient status post lens extraction bilaterally. Mastoids and visualized paranasal sinuses: Visualized paranasal sinuses are clear. Mastoid air cells are well pneumatized and clear. Skeleton: No acute osseus abnormality. No worrisome lytic or blastic osseous lesions. Moderate multilevel degenerative spondylolysis and facet arthrosis throughout the cervical spine, most notable at C5-6 and C6-7. Upper chest: Visualized upper chest within normal limits. Visualized lungs are clear. Other: None. IMPRESSION: 1. No mass lesion or adenopathy identified within the neck. Palpable area of concern appears to overlie the left submandibular gland which is normal in appearance. 2. No other acute abnormality identified within the neck. Electronically Signed   By: Jeannine Boga M.D.   On: 07/06/2017 16:12    Assessment & Plan:   Moiz was seen today for coronary artery disease.  Diagnoses and all orders for this visit:  Coronary artery disease involving native coronary artery of native heart without angina pectoris- His recent symptoms do not sound like angina and it is reassuring that his cardiac workup in [place] was negative for ischemia.  Will continue aggressive risk factor modification with a statin and I have also asked him to start aspirin 81 mg a day. -     aspirin EC 81 MG tablet; Take 1 tablet (81 mg total) by mouth daily.  PSA elevation- He has an appointment with urology in about 4-6 weeks.  I will recheck his PSA today and will fraction it out into the total and free to try to gauge whether or not there is concern for prostate cancer. -     PSA, total and free; Future  Oropharyngeal dysphagia- He will see GI about this. -     Ambulatory referral to Gastroenterology  Essential hypertension, benign- His blood pressure is adequately well controlled.  Recent electrolytes and renal function were normal.   I am having [name] start on  aspirin EC. I am also having him maintain his potassium gluconate, thyroid, acidophilus, acetaminophen, testosterone cypionate, DEXILANT, Plant Sterols and Stanols (CHOLEST OFF PO), Nutritional Supplements (DHEA PO), fluticasone, ezetimibe, liothyronine, anastrozole, Pitavastatin Calcium, acyclovir, and NON FORMULARY.  Meds ordered this encounter  Medications  . aspirin EC 81 MG tablet    Sig: Take 1 tablet (81 mg total) by mouth daily.    Dispense:  90 tablet    Refill:  3     Follow-up: Return in about 6 months (around 01/27/2018).  Scarlette Calico, MD

## 2017-07-30 NOTE — Telephone Encounter (Signed)
PA for Livalo was denied. Is there an alternative that would be as effective.   PA for dexilant was approved.

## 2017-07-30 NOTE — Telephone Encounter (Signed)
[id number]  Bin: [id number] PCN: [id number] Grp: [id number]

## 2017-07-30 NOTE — Telephone Encounter (Signed)
Becky from [facility] called to state the the Rx of Dexilant is approved and is good starting today for 65yr

## 2017-07-30 NOTE — Telephone Encounter (Signed)
Livalo Key: [id number]  Dexilant Key: Candee Furbish

## 2017-07-31 ENCOUNTER — Encounter: Payer: Self-pay | Admitting: Cardiovascular Disease

## 2017-07-31 ENCOUNTER — Ambulatory Visit (INDEPENDENT_AMBULATORY_CARE_PROVIDER_SITE_OTHER): Payer: Medicare Other | Admitting: Cardiovascular Disease

## 2017-07-31 ENCOUNTER — Other Ambulatory Visit: Payer: Self-pay | Admitting: Internal Medicine

## 2017-07-31 ENCOUNTER — Encounter: Payer: Self-pay | Admitting: Internal Medicine

## 2017-07-31 VITALS — BP 142/76 | HR 53 | Ht 73.0 in | Wt 218.2 lb

## 2017-07-31 DIAGNOSIS — I2510 Atherosclerotic heart disease of native coronary artery without angina pectoris: Secondary | ICD-10-CM

## 2017-07-31 DIAGNOSIS — E785 Hyperlipidemia, unspecified: Secondary | ICD-10-CM

## 2017-07-31 DIAGNOSIS — I10 Essential (primary) hypertension: Secondary | ICD-10-CM | POA: Diagnosis not present

## 2017-07-31 LAB — PSA: PSA: 5.3

## 2017-07-31 LAB — PSA, TOTAL AND FREE
PSA, % Free: 21 % (calc) — ABNORMAL LOW (ref >25)
PSA, FREE: 1.1 ng/mL
PSA, Total: 5.3 ng/mL — ABNORMAL HIGH (ref <=4.0)

## 2017-07-31 MED ORDER — PRAVASTATIN SODIUM 20 MG PO TABS: 20.0000 mg | ORAL_TABLET | Freq: Every day | ORAL | 1 refills | Status: DC

## 2017-07-31 NOTE — Assessment & Plan Note (Signed)
History of hyperlipidemia onLivalo  with recent blood work performed in [facility] 07/21/17 with  total cholesterol 147 and LDL 79 and HDL 35.

## 2017-07-31 NOTE — Telephone Encounter (Signed)
Changed to Pravachol Prescription sent

## 2017-07-31 NOTE — Assessment & Plan Note (Signed)
History of CAD status post circumflex and RCA intervention by myself 06/09/04. He had a Myoview stress test performed 07/16/14 which was low risk and nonischemic and another one recently done in [facility] 07/21/17 which likewise was low risk and nonischemic with a normal EF.

## 2017-07-31 NOTE — Telephone Encounter (Signed)
PA for Livalo was denied.

## 2017-07-31 NOTE — Progress Notes (Signed)
07/31/2017 [name] Glass   Feb 05, 1941  [id number]  Primary Physician [name], MD Primary Cardiologist: [name] MD FACP, [place], [place], [place]  HPI:  [name] Glass is a 77 y.o.  moderately overweight although fit-appearing divorced Caucasian male with no children who I last saw 09/10/15. He has a history of CAD status post circumflex and RCA stenting by myself 06/09/04. His other cardiovascular problems include hypertension, hyperlipidemia and statin intolerance. He does drink Pinch blended scotch on a daily basis. He denies chest pain or shortness of breath. 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 He had a Myoview stress test performed 07/16/14 for preoperative clearance prior to a right total knee replacement which was performed by Dr. Ricki Rodriguez 08/03/14. This was low risk and nonischemic. Since I saw him last he was down in [place] at the [address] and developed some throat pain. This led to an extensive workup in the hospital including fairly normal 2-D echo, a Myoview stress test. He ruled out for myocardial infarction. He scheduled to have cosmetic surgery on his eyelids and eyebrow 09/27/17 when she is cleared for given his recent negative stress test. He also recently got engaged and is getting married this spring and going to week cruise/honeymoon..   Current Meds  Medication Sig  . acetaminophen (TYLENOL) 650 MG CR tablet Take 1,300 mg by mouth at bedtime. Reported on 12/16/2015  . acidophilus (RISAQUAD) CAPS capsule Take 1 capsule by mouth 2 (two) times daily.  [name] acyclovir (ZOVIRAX) 200 MG capsule acyclovir 200 mg capsule  . anastrozole (ARIMIDEX) 1 MG tablet TAKE 1 TABLET 4 TIMES A WEEK  . aspirin EC 81 MG tablet Take 1 tablet (81 mg total) by mouth daily.  [name] DEXILANT 60 MG capsule Take 1 capsule by mouth daily.  [name] ezetimibe (ZETIA) 10 MG tablet Take 1 tablet (10 mg total) by mouth daily.  . fluticasone  (FLONASE) 50 MCG/ACT nasal spray USE DAILY AS DIRECTED  . liothyronine (CYTOMEL) 25 MCG tablet TAKE 1 TABLET UPON WAKING.  . NON FORMULARY Decoanate Injection  . Nutritional Supplements (DHEA PO) Take 25 mg by mouth.  . Plant Sterols and Stanols (CHOLEST OFF PO) Take 900 mg by mouth.  . potassium gluconate 595 MG TABS Take 595 mg by mouth every evening.  . pravastatin (PRAVACHOL) 20 MG tablet Take 1 tablet (20 mg total) by mouth daily.  [name] testosterone cypionate (DEPOTESTOTERONE CYPIONATE) 100 MG/ML injection Inject into the muscle once a week. For IM use only  . thyroid (ARMOUR) 60 MG tablet Take 60-120 mg by mouth 2 (two) times daily.      Allergies  Allergen Reactions  . Morphine And Related     Had "crazy dreams" felt "crazy" per pt.  . Bactrim [Sulfamethoxazole-Trimethoprim]   . Statins     Social History   Socioeconomic History  . Marital status: Divorced    Spouse name: Not on file  . Number of children: Not on file  . Years of education: Not on file  . Highest education level: Not on file  Social Needs  . Financial resource strain: Not on file  . Food insecurity - worry: Not on file  . Food insecurity - inability: Not on file  . Transportation needs - medical: Not on file  . Transportation needs - non-medical: Not on file  Occupational History  . Not on file  Tobacco Use  .  Smoking status: Never Smoker  . Smokeless tobacco: Never Used  Substance and Sexual Activity  . Alcohol use: Yes    Alcohol/week: 2.4 oz    Types: 4 Standard drinks or equivalent per week    Comment: 3 drinks 4 times a week  . Drug use: No  . Sexual activity: Not on file  Other Topics Concern  . Not on file  Social History Narrative  . Not on file     Review of Systems: General: negative for chills, fever, night sweats or weight changes.  Cardiovascular: negative for chest pain, dyspnea on exertion, edema, orthopnea, palpitations, paroxysmal nocturnal dyspnea or shortness of  breath Dermatological: negative for rash Respiratory: negative for cough or wheezing Urologic: negative for hematuria Abdominal: negative for nausea, vomiting, diarrhea, bright red blood per rectum, melena, or hematemesis Neurologic: negative for visual changes, syncope, or dizziness All other systems reviewed and are otherwise negative except as noted above.    Blood pressure (!) 142/76, pulse (!) 53, height 6\' 1"  (1.854 m), weight 218 lb 3.2 oz (99 kg).  General appearance: alert and no distress Neck: no adenopathy, no carotid bruit, no JVD, supple, symmetrical, trachea midline and thyroid not enlarged, symmetric, no tenderness/mass/nodules Lungs: clear to auscultation bilaterally Heart: Soft outflow tract murmur Extremities: extremities normal, atraumatic, no cyanosis or edema Pulses: 2+ and symmetric Skin: Skin color, texture, turgor normal. No rashes or lesions Neurologic: Alert and oriented X 3, normal strength and tone. Normal symmetric reflexes. Normal coordination and gait  EKG sinus bradycardia 53 lateral T-wave inversion. I personally reviewed this EKG  ASSESSMENT AND PLAN:   Coronary artery disease History of CAD status post circumflex and RCA intervention by myself 06/09/04. He had a Myoview stress test performed 07/16/14 which was low risk and nonischemic and another one recently done in [facility] 07/21/17 which likewise was low risk and nonischemic with a normal EF.   Essential hypertension, benign History of essential hypertension blood pressure measured 142/76. He is on no antihypertensive medications.  Hyperlipidemia with target LDL less than 70 History of hyperlipidemia onLivalo  with recent blood work performed in [facility] 07/21/17 with  total cholesterol 147 and LDL 79 and HDL 35.      [name] MD [facility], [facility] 07/31/2017 4:29 PM

## 2017-07-31 NOTE — Patient Instructions (Signed)

## 2017-07-31 NOTE — Assessment & Plan Note (Signed)
History of essential hypertension blood pressure measured 142/76. He is on no antihypertensive medications.

## 2017-08-08 ENCOUNTER — Encounter: Payer: Self-pay | Admitting: Gastroenterology

## 2017-08-08 ENCOUNTER — Ambulatory Visit (INDEPENDENT_AMBULATORY_CARE_PROVIDER_SITE_OTHER): Payer: Medicare Other | Admitting: Gastroenterology

## 2017-08-08 VITALS — BP 112/62 | HR 56 | Ht 71.0 in | Wt 216.0 lb

## 2017-08-08 DIAGNOSIS — R1312 Dysphagia, oropharyngeal phase: Secondary | ICD-10-CM

## 2017-08-08 DIAGNOSIS — M542 Cervicalgia: Secondary | ICD-10-CM | POA: Diagnosis not present

## 2017-08-08 DIAGNOSIS — I2510 Atherosclerotic heart disease of native coronary artery without angina pectoris: Secondary | ICD-10-CM

## 2017-08-08 NOTE — Patient Instructions (Signed)
Your provider would like you to see [facility].  Their address is [address] [place], [place] [address] The phone number is [phone].  Call back if you decide to schedule an esophagram. __________________________________________________________________ A barium swallow is an examination that concentrates on views of the esophagus. This tends to be a double contrast exam (barium and two liquids which, when combined, create a gas to distend the wall of the oesophagus) or single contrast (non-ionic iodine based). The study is usually tailored to your symptoms so a good history is essential. Attention is paid during the study to the form, structure and configuration of the esophagus, looking for functional disorders (such as aspiration, dysphagia, achalasia, motility and reflux) EXAMINATION You may be asked to change into a gown, depending on the type of swallow being performed. A radiologist and radiographer will perform the procedure. The radiologist will advise you of the type of contrast selected for your procedure and direct you during the exam. You will be asked to stand, sit or lie in several different positions and to hold a small amount of fluid in your mouth before being asked to swallow while the imaging is performed .In some instances you may be asked to swallow barium coated marshmallows to assess the motility of a solid food bolus. The exam can be recorded as a digital or video fluoroscopy procedure. POST PROCEDURE It will take 1-2 days for the barium to pass through your system. To facilitate this, it is important, unless otherwise directed, to increase your fluids for the next 24-48hrs and to resume your normal diet.  This test typically takes about 30 minutes to perform. __________________________________________________________________________________

## 2017-08-08 NOTE — Progress Notes (Signed)
Reviewed and agree with initial management plan.  Malcolm T. Stark, MD FACG 

## 2017-08-08 NOTE — Progress Notes (Signed)
08/08/2017 [name] Glass [id number] 10-16-1940   HISTORY OF PRESENT ILLNESS: This is a 77 year old male who is new to our practice.  He was previously seen by Dr. Lajoyce Corners in the past and then was seen by Dr. Paulita Fujita at [facility].  It looks like he had a colonoscopy in March 2015 at which time he had one tubular adenoma removed from the rectum.  It appears that he had an EGD at that time as well and had some gastric biopsies taken that showed fundic gland polyps negative for H. pylori.  We only have the pathology reports.  Anyway, he presents to our office today at the recommendation of his PCP, Dr. Ronnald Ramp, for evaluation regarding neck pain and dysphasia.  The patient tells me that on February 1 he had a CT scan of the neck after he saw Dr. Ronnald Ramp with complaints of a "knot" in his submandibular region.  CT of the neck was unremarkable.  Then he says he was in [place] for the [place] races and while in the crowd he turned his neck suddenly and developed excruciating pain in the left side of his neck.  He says that he was not able to swallow.  He says that if he would stretch his neck out then it would allow him to swallow eventually, but he continues to have severe pain.  He ended up going to the emergency department there where they admitted him to the hospital thinking that it was possibly a heart issue due to his previous cardiac history with 5 cardiac stents.  He was admitted to the hospital for 2 days underwent extensive cardiac testing.  He says that they did another CT scan of his neck but he was told that it was also unremarkable, but we do not have those records.  He says that he has never had this happen before.  He says that at this point the symptoms have about resolved.  He still has a little bit of discomfort in his neck, but is swallowing just fine with no issues.   Past Medical History:  Diagnosis Date  . Arthritis   . Coronary artery disease   . GERD (gastroesophageal reflux disease)   .  H/O bronchitis   . H/O hiatal hernia   . History of kidney stones last in 1976  . Hyperlipidemia    statin intolerant, under control  . Hypertension    under control  . Hypothyroidism   . Sleep apnea    hx of, surgery to reverse   Past Surgical History:  Procedure Laterality Date  . CARDIAC CATHETERIZATION Right 2008   5 heart stents  . CATARACT EXTRACTION Bilateral 6 years ago  . CYSTOSCOPY N/A 08/06/2012   Procedure: CYSTOSCOPY;  Surgeon: [name], MD;  Location: [facility];  Service: Urology;  Laterality: N/A;  . ESOPHAGOGASTRIC FUNDOPLICATION  7001   spleen removed, 5 pints of blood given  . HERNIA REPAIR  1985   x4  . INGUINAL HERNIA REPAIR  1992  . PROSTATE ABLATION  2013   "laser ablation"  . TONSILLECTOMY  as child  . TOTAL KNEE ARTHROPLASTY Right 08/03/2014   Procedure: TOTAL RIGHT KNEE ARTHROPLASTY;  Surgeon: [name], MD;  Location: [facility];  Service: Orthopedics;  Laterality: Right;  . TRANSURETHRAL RESECTION OF PROSTATE N/A 08/06/2012   Procedure: [place] Free OF PROSTATE WITH GYRUS ;  Surgeon: [name], MD;  Location: [facility];  Service: Urology;  Laterality: N/A;  .  UMBILICAL HERNIA REPAIR  1990  . UVULECTOMY  2005   for sleep apnea  . UVULOPALATOPHARYNGOPLASTY  6 years ago    reports that  has never smoked. he has never used smokeless tobacco. He reports that he drinks about 2.4 oz of alcohol per week. He reports that he does not use drugs. family history includes Alzheimer's disease in his maternal grandmother and mother; Breast cancer in his mother; Cancer in his paternal grandmother; Diabetes in his paternal grandfather; Heart attack (age of onset: 61) in his father; Lung cancer in his maternal grandfather and maternal grandmother; Other in his paternal grandfather; Stroke in his maternal grandmother and mother. Allergies  Allergen Reactions  . Morphine And Related     Had "crazy dreams" felt "crazy" per pt.  . Bactrim [Sulfamethoxazole-Trimethoprim]   .  Statins       Outpatient Encounter Medications as of 08/08/2017  Medication Sig  . acetaminophen (TYLENOL) 650 MG CR tablet Take 1,300 mg by mouth at bedtime. Reported on 12/16/2015  . acidophilus (RISAQUAD) CAPS capsule Take 1 capsule by mouth 2 (two) times daily.  [name] acyclovir (ZOVIRAX) 200 MG capsule acyclovir 200 mg capsule  . anastrozole (ARIMIDEX) 1 MG tablet TAKE 1 TABLET 4 TIMES A WEEK  . aspirin EC 81 MG tablet Take 1 tablet (81 mg total) by mouth daily.  [name] DEXILANT 60 MG capsule Take 1 capsule by mouth daily.  [name] ezetimibe (ZETIA) 10 MG tablet Take 1 tablet (10 mg total) by mouth daily.  . fluticasone (FLONASE) 50 MCG/ACT nasal spray USE DAILY AS DIRECTED  . liothyronine (CYTOMEL) 25 MCG tablet TAKE 1 TABLET UPON WAKING.  . NON FORMULARY Decoanate Injection  . Nutritional Supplements (DHEA PO) Take 25 mg by mouth.  . Plant Sterols and Stanols (CHOLEST OFF PO) Take 900 mg by mouth.  . potassium gluconate 595 MG TABS Take 595 mg by mouth every evening.  . pravastatin (PRAVACHOL) 20 MG tablet Take 1 tablet (20 mg total) by mouth daily.  [name] testosterone cypionate (DEPOTESTOTERONE CYPIONATE) 100 MG/ML injection Inject into the muscle once a week. For IM use only  . thyroid (ARMOUR) 60 MG tablet Take 60-120 mg by mouth 2 (two) times daily.    No facility-administered encounter medications on file as of 08/08/2017.      REVIEW OF SYSTEMS  : All other systems reviewed and negative except where noted in the History of Present Illness.   PHYSICAL EXAM: BP 112/62 (BP Location: Left Arm, Patient Position: Sitting)   Pulse (!) 56   Ht 5\' 11"  (1.803 m) Comment: measuerd without shoes  Wt 216 lb (98 kg)   BMI 30.13 kg/m  General: Well developed white male in no acute distress Head: Normocephalic and atraumatic Eyes:  Sclerae anicteric, conjunctiva pink. Ears: Normal auditory acuity Neck:  Slight fullness on the left submandibular region. Lungs: Clear throughout to auscultation; no  increased WOB. Heart: Regular rate and rhythm; no M/R/G. Abdomen: Soft, non-distended.  BS present.  Non-tender. Musculoskeletal: Symmetrical with no gross deformities  Skin: No lesions on visible extremities Extremities: No edema  Neurological: Alert oriented x 4, grossly non-focal Psychological:  Alert and cooperative. 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  He has had 2 CT scans of the neck that have been unremarkable.  His symptoms are about completely resolved at this point.  We discussed evaluation with esophagram, ? MRI,  and I have also recommended that he see ENT particularly if symptoms persist and esophagram is unremarkable.  He is going to hold off for now and see if symptoms continue to resolve further.   CC:  [name], MD

## 2017-08-08 NOTE — Telephone Encounter (Signed)
Called [employer] for appeal due to denial of Livalo

## 2017-08-09 ENCOUNTER — Telehealth: Payer: Self-pay | Admitting: Internal Medicine

## 2017-08-09 DIAGNOSIS — E785 Hyperlipidemia, unspecified: Secondary | ICD-10-CM

## 2017-08-09 NOTE — Telephone Encounter (Signed)
Caller Name: Yvone Neu with [facility] Phone: [phone]  Calling regarding PA for Livalo - pt filed an expedited appeal and required resolution w/in 72 hours (filed at 3:47pm 08/08/17 so with the weekend coming claim has to be resolved by end of day 08/10/17).

## 2017-08-09 NOTE — Telephone Encounter (Signed)
Copied from [place] [id number]. Topic: Quick Communication - See Telephone Encounter >> Aug 09, 2017  8:32 AM Lolita Rieger, RMA wrote: CRM for notification. See Telephone encounter for:   08/09/17.Yvone Neu from [place] would like a call back [phone] to discuss livalo and getting pt approved for this medication

## 2017-08-10 NOTE — Telephone Encounter (Signed)
Contacted Ken with [employer]. The appeal has been started.

## 2017-08-14 MED ORDER — PITAVASTATIN CALCIUM 1 MG PO TABS: 1.0000 mg | ORAL_TABLET | Freq: Every day | ORAL | 1 refills | Status: DC

## 2017-08-14 NOTE — Telephone Encounter (Signed)
PA appeal has been approved. Can you inform pt of same?  Rx has been sent to pharmacy. Med list has been updated.

## 2017-08-14 NOTE — Addendum Note (Signed)
Addended by: Karle Barr on: 08/14/2017 09:46 AM   Modules accepted: Orders

## 2017-08-15 NOTE — Telephone Encounter (Signed)
Pt informed

## 2017-09-03 ENCOUNTER — Other Ambulatory Visit: Payer: Self-pay | Admitting: Internal Medicine

## 2017-09-12 ENCOUNTER — Telehealth: Payer: Self-pay

## 2017-09-12 NOTE — Telephone Encounter (Signed)
   [facility] Pre-operative Risk Assessment    Request for surgical clearance:  1. What type of surgery is being performed? Bilateral upper ptosis repair and biepharoplasty brow ptosis repair via forehead flap and lower eyelid ectropion  2. When is this surgery scheduled? 09/27/17  3. What type of clearance is required (medical clearance vs. Pharmacy clearance to hold med vs. Both)? Both  4. Are there any medications that need to be held prior to surgery and how long? Aspirin  5. Practice name and name of physician performing surgery? [facility]  Dr not listed  6. What is your office phone number [phone]   7.   What is your office fax number [phone]  8.   Anesthesia type (None, local, MAC, general) ? MAC   [name] Glass [name] Glass 09/12/2017, 5:01 PM  _________________________________________________________________   (provider comments below)

## 2017-09-13 NOTE — Telephone Encounter (Signed)
Dr. [name] can pt stop Asprin for 5-7 days for his eye lid surgery?  I saw that you had cleared him otherwise.  Thanks please send note to [facility] Pre-op

## 2017-09-14 NOTE — Telephone Encounter (Signed)
Yes

## 2017-09-17 NOTE — Telephone Encounter (Signed)
Fax sent to requesting office.  Called pt and informed him of recommendations and to reach out to surgeon's office on exact hold days for ASA.  Pt appreciative for call.

## 2017-09-27 DIAGNOSIS — H02115 Cicatricial ectropion of left lower eyelid: Secondary | ICD-10-CM | POA: Diagnosis not present

## 2017-09-27 DIAGNOSIS — H53483 Generalized contraction of visual field, bilateral: Secondary | ICD-10-CM | POA: Diagnosis not present

## 2017-09-27 DIAGNOSIS — H02834 Dermatochalasis of left upper eyelid: Secondary | ICD-10-CM | POA: Diagnosis not present

## 2017-09-27 DIAGNOSIS — H02112 Cicatricial ectropion of right lower eyelid: Secondary | ICD-10-CM | POA: Diagnosis not present

## 2017-09-27 DIAGNOSIS — H11433 Conjunctival hyperemia, bilateral: Secondary | ICD-10-CM | POA: Diagnosis not present

## 2017-09-27 DIAGNOSIS — H02423 Myogenic ptosis of bilateral eyelids: Secondary | ICD-10-CM | POA: Diagnosis not present

## 2017-09-27 DIAGNOSIS — H02413 Mechanical ptosis of bilateral eyelids: Secondary | ICD-10-CM | POA: Diagnosis not present

## 2017-09-27 DIAGNOSIS — H02831 Dermatochalasis of right upper eyelid: Secondary | ICD-10-CM | POA: Diagnosis not present

## 2017-09-27 DIAGNOSIS — H02532 Eyelid retraction right lower eyelid: Secondary | ICD-10-CM | POA: Diagnosis not present

## 2017-09-27 DIAGNOSIS — H02431 Paralytic ptosis of right eyelid: Secondary | ICD-10-CM | POA: Diagnosis not present

## 2017-09-27 DIAGNOSIS — H02132 Senile ectropion of right lower eyelid: Secondary | ICD-10-CM | POA: Diagnosis not present

## 2017-09-27 DIAGNOSIS — H02535 Eyelid retraction left lower eyelid: Secondary | ICD-10-CM | POA: Diagnosis not present

## 2017-09-27 DIAGNOSIS — H0279 Other degenerative disorders of eyelid and periocular area: Secondary | ICD-10-CM | POA: Diagnosis not present

## 2017-09-27 DIAGNOSIS — H02135 Senile ectropion of left lower eyelid: Secondary | ICD-10-CM | POA: Diagnosis not present

## 2017-09-27 DIAGNOSIS — G8918 Other acute postprocedural pain: Secondary | ICD-10-CM | POA: Diagnosis not present

## 2017-11-05 ENCOUNTER — Other Ambulatory Visit: Payer: Self-pay | Admitting: Internal Medicine

## 2017-11-12 ENCOUNTER — Ambulatory Visit (INDEPENDENT_AMBULATORY_CARE_PROVIDER_SITE_OTHER): Payer: Medicare Other | Admitting: Internal Medicine

## 2017-11-12 ENCOUNTER — Encounter: Payer: Self-pay | Admitting: Internal Medicine

## 2017-11-12 VITALS — BP 140/70 | HR 54 | Temp 98.6°F | Resp 16 | Ht 71.0 in | Wt 221.0 lb

## 2017-11-12 DIAGNOSIS — I2510 Atherosclerotic heart disease of native coronary artery without angina pectoris: Secondary | ICD-10-CM | POA: Diagnosis not present

## 2017-11-12 DIAGNOSIS — J0100 Acute maxillary sinusitis, unspecified: Secondary | ICD-10-CM | POA: Diagnosis not present

## 2017-11-12 MED ORDER — AMOXICILLIN-POT CLAVULANATE 875-125 MG PO TABS: 1.0000 | ORAL_TABLET | Freq: Two times a day (BID) | ORAL | 0 refills | Status: AC

## 2017-11-12 NOTE — Patient Instructions (Signed)

## 2017-11-12 NOTE — Progress Notes (Signed)
Subjective:  Patient ID: [name] Glass, male    DOB: Oct 05, 1940  Age: 77 y.o. MRN: [id number]  CC: Sinusitis   HPI [name] Glass presents for a 3-week history of facial pain, runny nose producing thick green phlegm, cough productive of green phlegm, low-grade fever, and chills.  Outpatient Medications Prior to Visit  Medication Sig Dispense Refill  . acetaminophen (TYLENOL) 650 MG CR tablet Take 1,300 mg by mouth at bedtime. Reported on 12/16/2015    . acidophilus (RISAQUAD) CAPS capsule Take 1 capsule by mouth 2 (two) times daily.    [name] acyclovir (ZOVIRAX) 200 MG capsule acyclovir 200 mg capsule    . anastrozole (ARIMIDEX) 1 MG tablet TAKE 1 TABLET 4 TIMES A WEEK    . aspirin EC 81 MG tablet Take 1 tablet (81 mg total) by mouth daily. 90 tablet 3  . DEXILANT 60 MG capsule Take 1 capsule by mouth daily.  1  . ezetimibe (ZETIA) 10 MG tablet TAKE 1 TABLET BY MOUTH EVERY DAY 90 tablet 1  . fluticasone (FLONASE) 50 MCG/ACT nasal spray USE DAILY AS DIRECTED 48 g 1  . liothyronine (CYTOMEL) 25 MCG tablet TAKE 1 TABLET UPON WAKING.  3  . NON FORMULARY Decoanate Injection    . Nutritional Supplements (DHEA PO) Take 25 mg by mouth.    . Pitavastatin Calcium 1 MG TABS Take 1 tablet (1 mg total) by mouth daily. 90 tablet 1  . Plant Sterols and Stanols (CHOLEST OFF PO) Take 900 mg by mouth.    . potassium gluconate 595 MG TABS Take 595 mg by mouth every evening.    . testosterone cypionate (DEPOTESTOTERONE CYPIONATE) 100 MG/ML injection Inject into the muscle once a week. For IM use only    . thyroid (ARMOUR) 60 MG tablet Take 60-120 mg by mouth 2 (two) times daily.     [name] acyclovir (ZOVIRAX) 200 MG capsule TAKE 1 CAPSULE BY MOUTH THREE TIMES A DAY 270 capsule 1   No facility-administered medications prior to visit.     ROS Review of Systems  Constitutional: Positive for chills and fever. Negative for diaphoresis and fatigue.  HENT: Positive for congestion, postnasal drip, rhinorrhea, sinus  pressure and sinus pain. Negative for facial swelling, sneezing and trouble swallowing.   Eyes: Negative.   Respiratory: Positive for cough. Negative for chest tightness, shortness of breath and wheezing.   Cardiovascular: Negative for chest pain, palpitations and leg swelling.  Gastrointestinal: Negative for abdominal pain, constipation, diarrhea, nausea and vomiting.  Endocrine: Negative.   Genitourinary: Negative.   Musculoskeletal: Negative.  Negative for arthralgias and myalgias.  Skin: Negative.  Negative for color change and pallor.  Allergic/Immunologic: Negative.   Neurological: Negative.  Negative for dizziness, weakness and light-headedness.  Hematological: Negative for adenopathy. Does not bruise/bleed easily.  Psychiatric/Behavioral: Negative.     Objective:  BP 140/70 (BP Location: Left Arm, Patient Position: Sitting, Cuff Size: Normal)   Pulse (!) 54   Temp 98.6 F (37 C) (Oral)   Resp 16   Ht 5\' 11"  (1.803 m)   Wt 221 lb (100.2 kg)   SpO2 96%   BMI 30.82 kg/m   BP Readings from Last 3 Encounters:  11/12/17 140/70  08/08/17 112/62  07/31/17 (!) 142/76    Wt Readings from Last 3 Encounters:  11/12/17 221 lb (100.2 kg)  08/08/17 216 lb (98 kg)  07/31/17 218 lb 3.2 oz (99 kg)    Physical Exam  Constitutional: He is oriented to  person, place, and time.  Non-toxic appearance. He does not have a sickly appearance. He does not appear ill. No distress.  HENT:  Nose: Rhinorrhea present. No mucosal edema or sinus tenderness. No epistaxis. Right sinus exhibits maxillary sinus tenderness. Right sinus exhibits no frontal sinus tenderness. Left sinus exhibits maxillary sinus tenderness. Left sinus exhibits no frontal sinus tenderness.  Mouth/Throat: Mucous membranes are not pale, not dry and not cyanotic. Posterior oropharyngeal erythema present. No oropharyngeal exudate, posterior oropharyngeal edema or tonsillar abscesses.  Eyes: Conjunctivae are normal. No scleral  icterus.  Neck: Normal range of motion. Neck supple. No JVD present. No thyromegaly present.  Cardiovascular: Normal rate, regular rhythm and normal heart sounds. Exam reveals no gallop.  No murmur heard. Pulmonary/Chest: Effort normal and breath sounds normal. No respiratory distress. He has no wheezes. He has no rales.  Abdominal: Soft. Bowel sounds are normal. He exhibits no mass. There is no hepatosplenomegaly. There is no tenderness.  Musculoskeletal: Normal range of motion. He exhibits no edema, tenderness or deformity.  Lymphadenopathy:    He has no cervical adenopathy.  Neurological: He is alert and oriented to person, place, and time.  Skin: Skin is warm and dry. He is not diaphoretic.  Vitals reviewed.   Lab Results  Component Value Date   WBC 6.1 07/21/2017   HGB 16.0 07/21/2017   HCT 49 07/21/2017   PLT 300 07/21/2017   GLUCOSE 102 (H) 06/22/2017   CHOL 181 06/22/2017   TRIG 166 (A) 07/21/2017   HDL 35 07/21/2017   LDLCALC 79 07/21/2017   ALT 14 06/22/2017   AST 16 06/22/2017   NA 141 07/21/2017   K 4.1 07/21/2017   CL 103 06/22/2017   CREATININE 1.0 07/21/2017   BUN 17 07/21/2017   CO2 30 06/22/2017   TSH 0.04 (A) 07/21/2017   PSA 5.3 07/31/2017   INR 0.96 07/28/2014   HGBA1C 6.1 07/21/2017    Ct Soft Tissue Neck W Contrast  Result Date: 07/06/2017 CLINICAL DATA:  Initial evaluation for nontender anterior neck mass. EXAM: CT NECK WITH CONTRAST TECHNIQUE: Multidetector CT imaging of the neck was performed using the standard protocol following the bolus administration of intravenous contrast. CONTRAST:  41mL ISOVUE-300 IOPAMIDOL (ISOVUE-300) INJECTION 61% COMPARISON:  None available. FINDINGS: Pharynx and larynx: Oral cavity within normal limits without mass lesion or loculated fluid collection. No acute abnormality about the dentition. Palatine tonsils symmetric and within normal limits. Parapharyngeal fat preserved. Nasopharynx normal. Retropharyngeal soft tissues  normal. Epiglottis within normal limits. Vallecula clear. Remainder of the hypopharynx and supraglottic larynx within normal limits. True cords apposed and not well evaluated. Subglottic airway clear. Salivary glands: Metallic BB marker overlies the left anterior neck at the palpable area concern. This overlies the left submandibular gland, which is positioned immediately subjacent to the marker BB. The gland itself is normal in appearance without mass lesion or acute inflammatory changes. No other mass lesion or other abnormality within this region to correspond with palpable abnormality. Remainder of the salivary glands including the parotid and right submandibular gland are normal. Thyroid: Thyroid normal. Lymph nodes: No pathologically enlarged lymph nodes identified within the neck. Vascular: Normal intravascular enhancement seen throughout the neck. Scattered atherosclerotic plaque about the carotid bifurcations and aortic arch noted. Limited intracranial: Unremarkable. Visualized orbits: Partially visualized globes and orbital soft tissues within normal limits. Patient status post lens extraction bilaterally. Mastoids and visualized paranasal sinuses: Visualized paranasal sinuses are clear. Mastoid air cells are well pneumatized and clear. Skeleton:  No acute osseus abnormality. No worrisome lytic or blastic osseous lesions. Moderate multilevel degenerative spondylolysis and facet arthrosis throughout the cervical spine, most notable at C5-6 and C6-7. Upper chest: Visualized upper chest within normal limits. Visualized lungs are clear. Other: None. IMPRESSION: 1. No mass lesion or adenopathy identified within the neck. Palpable area of concern appears to overlie the left submandibular gland which is normal in appearance. 2. No other acute abnormality identified within the neck. Electronically Signed   By: Jeannine Boga M.D.   On: 07/06/2017 16:12    Assessment & Plan:   [name] Glass was seen today for  sinusitis.  Diagnoses and all orders for this visit:  Acute non-recurrent maxillary sinusitis -     amoxicillin-clavulanate (AUGMENTIN) 875-125 MG tablet; Take 1 tablet by mouth 2 (two) times daily for 10 days.   I am having [name] start on amoxicillin-clavulanate. I am also having him maintain his potassium gluconate, thyroid, acidophilus, acetaminophen, testosterone cypionate, DEXILANT, Plant Sterols and Stanols (CHOLEST OFF PO), Nutritional Supplements (DHEA PO), fluticasone, liothyronine, anastrozole, acyclovir, NON FORMULARY, aspirin EC, Pitavastatin Calcium, and ezetimibe.  Meds ordered this encounter  Medications  . amoxicillin-clavulanate (AUGMENTIN) 875-125 MG tablet    Sig: Take 1 tablet by mouth 2 (two) times daily for 10 days.    Dispense:  20 tablet    Refill:  0     Follow-up: Return if symptoms worsen or fail to improve.  Scarlette Calico, MD

## 2017-12-03 ENCOUNTER — Telehealth: Payer: Self-pay | Admitting: Internal Medicine

## 2017-12-03 NOTE — Telephone Encounter (Signed)
Ive tried to get appt with [facility] and also any ENT office in [place] for today---there are no openings---per dr Ronnald Ramp, patient needs to go to ED and ask for GI consult---can go to [facility] on [address] where usually wait is not as long to be seen---tried to call patient back to advise, his phone kept breaking up, I could not understand what he was saying but asked him to call back when he could hear me better---can talk with tamara,RN at [place] office if needed

## 2017-12-03 NOTE — Telephone Encounter (Signed)
Patient states that Dr. Ronnald Ramp has seen him for this issue.  States that he is having difficulty breathing at night. I am getting [facility] Triage.

## 2017-12-03 NOTE — Telephone Encounter (Signed)
Copied from [place] [id number]. Topic: General - Other >> Dec 03, 2017  8:41 AM Carolyn Stare wrote:  Ppt call to ask for a referral to see a ENT. He said he is having issues with his throat  [phone]

## 2017-12-17 DIAGNOSIS — Z09 Encounter for follow-up examination after completed treatment for conditions other than malignant neoplasm: Secondary | ICD-10-CM | POA: Diagnosis not present

## 2017-12-17 DIAGNOSIS — K118 Other diseases of salivary glands: Secondary | ICD-10-CM | POA: Diagnosis not present

## 2017-12-17 DIAGNOSIS — K219 Gastro-esophageal reflux disease without esophagitis: Secondary | ICD-10-CM | POA: Diagnosis not present

## 2018-02-27 ENCOUNTER — Encounter: Payer: Self-pay | Admitting: Family

## 2018-02-27 ENCOUNTER — Ambulatory Visit (INDEPENDENT_AMBULATORY_CARE_PROVIDER_SITE_OTHER): Payer: Medicare Other | Admitting: Family

## 2018-02-27 VITALS — BP 132/76 | HR 54 | Temp 98.5°F | Ht 71.0 in | Wt 222.0 lb

## 2018-02-27 DIAGNOSIS — J209 Acute bronchitis, unspecified: Secondary | ICD-10-CM

## 2018-02-27 MED ORDER — DOXYCYCLINE HYCLATE 100 MG PO TABS: 100.0000 mg | ORAL_TABLET | Freq: Two times a day (BID) | ORAL | 0 refills | Status: DC

## 2018-02-27 NOTE — Progress Notes (Signed)
[name] Glass is a 77 y.o. male with the following history as recorded in Epic[employer]:  Patient Active Problem List   Diagnosis Date Noted  . 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  . Routine general medical examination at a health care facility 06/29/2017  . PSA elevation 06/26/2017  . Benign prostatic hyperplasia without lower urinary tract symptoms 05/21/2017  . Seasonal allergic rhinitis due to pollen 05/18/2017  . Acute non-recurrent maxillary sinusitis 04/19/2017  . Chronic renal disease, stage 2, mildly decreased glomerular filtration rate (GFR) between 60-89 mL/min/1.73 square meter 06/14/2016  . Systolic murmur 74/25/9563  . Gastroesophageal reflux disease without esophagitis 12/19/2015  . Essential hypertension, benign 12/19/2015  . Hyperlipidemia with target LDL less than 70 12/19/2015  . OA (osteoarthritis) of knee 08/03/2014  . Coronary artery disease 07/10/2014    Current Outpatient Medications  Medication Sig Dispense Refill  . acetaminophen (TYLENOL) 650 MG CR tablet Take 1,300 mg by mouth at bedtime. Reported on 12/16/2015    . acidophilus (RISAQUAD) CAPS capsule Take 1 capsule by mouth 2 (two) times daily.    [name] acyclovir (ZOVIRAX) 200 MG capsule acyclovir 200 mg capsule    . anastrozole (ARIMIDEX) 1 MG tablet TAKE 1 TABLET 4 TIMES A WEEK    . aspirin EC 81 MG tablet Take 1 tablet (81 mg total) by mouth daily. 90 tablet 3  . DEXILANT 60 MG capsule Take 1 capsule by mouth daily.  1  . ezetimibe (ZETIA) 10 MG tablet TAKE 1 TABLET BY MOUTH EVERY DAY 90 tablet 1  . fluticasone (FLONASE) 50 MCG/ACT nasal spray USE DAILY AS DIRECTED 48 g 1  . liothyronine (CYTOMEL) 25 MCG tablet TAKE 1 TABLET UPON WAKING.  3  . NON FORMULARY Decoanate Injection    . Nutritional Supplements (DHEA PO) Take 25 mg by mouth.    . Pitavastatin Calcium 1 MG TABS Take 1 tablet (1 mg total) by mouth daily. 90 tablet 1  . Plant Sterols and Stanols (CHOLEST OFF PO) Take 900 mg by mouth.    . potassium  gluconate 595 MG TABS Take 595 mg by mouth every evening.    . testosterone cypionate (DEPOTESTOTERONE CYPIONATE) 100 MG/ML injection Inject into the muscle once a week. For IM use only    . thyroid (ARMOUR) 60 MG tablet Take 60-120 mg by mouth 2 (two) times daily.     [name] doxycycline (VIBRA-TABS) 100 MG tablet Take 1 tablet (100 mg total) by mouth 2 (two) times daily. 20 tablet 0  . tamsulosin (FLOMAX) 0.4 MG CAPS capsule TAKE 1 CAPSULE BY MOUTH EVERY DAY IN THE MORNING  5   No current facility-administered medications for this visit.     Allergies: Morphine and related; Bactrim [sulfamethoxazole-trimethoprim]; and Statins  Past Medical History:  Diagnosis Date  . Arthritis   . Coronary artery disease   . GERD (gastroesophageal reflux disease)   . H/O bronchitis   . H/O hiatal hernia   . History of kidney stones last in 1976  . Hyperlipidemia    statin intolerant, under control  . Hypertension    under control  . Hypothyroidism   . Sleep apnea    hx of, surgery to reverse    Past Surgical History:  Procedure Laterality Date  . CARDIAC CATHETERIZATION Right 2008   5 heart stents  . CATARACT EXTRACTION Bilateral 6 years ago  . CYSTOSCOPY N/A 08/06/2012   Procedure: CYSTOSCOPY;  Surgeon: [name], MD;  Location: [facility];  Service: Urology;  Laterality:  N/A;  . ESOPHAGOGASTRIC FUNDOPLICATION  4431   spleen removed, 5 pints of blood given  . HERNIA REPAIR  1985   x4  . INGUINAL HERNIA REPAIR  1992  . PROSTATE ABLATION  2013   "laser ablation"  . TONSILLECTOMY  as child  . TOTAL KNEE ARTHROPLASTY Right 08/03/2014   Procedure: TOTAL RIGHT KNEE ARTHROPLASTY;  Surgeon: [name], MD;  Location: [facility];  Service: Orthopedics;  Laterality: Right;  . TRANSURETHRAL RESECTION OF PROSTATE N/A 08/06/2012   Procedure: Almyra Free OF PROSTATE WITH GYRUS ;  Surgeon: [name], MD;  Location: [facility];  Service: Urology;  Laterality: N/A;  . UMBILICAL HERNIA REPAIR  1990  . UVULECTOMY   2005   for sleep apnea  . UVULOPALATOPHARYNGOPLASTY  6 years ago    Family History  Problem Relation Age of Onset  . Heart attack Father 39       lived to 57  . Alzheimer's disease Mother   . Stroke Mother   . Breast cancer Mother   . Alzheimer's disease Maternal Grandmother   . Stroke Maternal Grandmother   . Lung cancer Maternal Grandmother   . Lung cancer Maternal Grandfather   . Cancer Paternal Grandmother        type unknown  . Diabetes Paternal Grandfather   . Other Paternal Grandfather        Growth in the back of the head    Social History   Tobacco Use  . Smoking status: Never Smoker  . Smokeless tobacco: Never Used  Substance Use Topics  . Alcohol use: Yes    Alcohol/week: 4.0 standard drinks    Types: 4 Standard drinks or equivalent per week    Comment: 3 drinks at night     Subjective:  Patient presents with 2 week history of cough/ congestion; + productive cough; + thick, green mucus; ? Low grade fever; no relief with Dayquil/ Nyquil/ sudafed; had pneumonia in December 2018;   Objective:  Vitals:   02/27/18 1511  BP: 132/76  Pulse: (!) 54  Temp: 98.5 F (36.9 C)  TempSrc: Oral  SpO2: 96%  Weight: 222 lb (100.7 kg)  Height: 5\' 11"  (1.803 m)    General: Well developed, well nourished, in no acute distress  Skin : Warm and dry.  Head: Normocephalic and atraumatic  Eyes: Sclera and conjunctiva clear; pupils round and reactive to light; extraocular movements intact  Ears: External normal; canals clear; tympanic membranes normal  Oropharynx: Pink, supple. No suspicious lesions  Neck: Supple without thyromegaly, adenopathy  Lungs: Respirations unlabored; clear to auscultation bilaterally without wheeze, rales, rhonchi  CVS exam: normal rate and regular rhythm.  Neurologic: Alert and oriented; speech intact; face symmetrical; moves all extremities well; CNII-XII intact without focal deficit   Assessment:  1. Acute bronchitis, unspecified organism      Plan:  Rx for Doxycycline 100 mg bid x 10 days; continue Flonase; increase fluids, rest and follow-up worse, no better.   No follow-ups on file.  No orders of the defined types were placed in this encounter.   Requested Prescriptions   Signed Prescriptions Disp Refills  . doxycycline (VIBRA-TABS) 100 MG tablet 20 tablet 0    Sig: Take 1 tablet (100 mg total) by mouth 2 (two) times daily.

## 2018-03-03 ENCOUNTER — Other Ambulatory Visit: Payer: Self-pay | Admitting: Internal Medicine

## 2018-03-06 ENCOUNTER — Other Ambulatory Visit: Payer: Self-pay | Admitting: Internal Medicine

## 2018-03-06 DIAGNOSIS — E785 Hyperlipidemia, unspecified: Secondary | ICD-10-CM

## 2018-05-17 ENCOUNTER — Other Ambulatory Visit: Payer: Self-pay | Admitting: Internal Medicine

## 2018-05-24 LAB — HEPATIC FUNCTION PANEL
ALT: 22 (ref 10–40)
AST: 22 (ref 14–40)
Alkaline Phosphatase: 58 (ref 25–125)
Bilirubin, Total: 0.7

## 2018-05-24 LAB — BASIC METABOLIC PANEL
BUN: 13 (ref 4–21)
CREATININE: 1.2 (ref 0.6–1.3)
Glucose: 86
Potassium: 4.9 (ref 3.4–5.3)
Sodium: 144 (ref 137–147)

## 2018-05-24 LAB — LIPID PANEL
Cholesterol: 189 (ref 0–200)
HDL: 48 (ref 35–70)
LDL Cholesterol: 114
TRIGLYCERIDES: 137 (ref 40–160)

## 2018-05-24 LAB — CBC AND DIFFERENTIAL
HCT: 55 — AB (ref 41–53)
Hemoglobin: 18.3 — AB (ref 13.5–17.5)
WBC: 5.2

## 2018-05-24 LAB — TSH: TSH: 1.45 (ref 0.41–5.90)

## 2018-05-24 LAB — PSA: PSA: 4.4

## 2018-05-28 ENCOUNTER — Encounter: Payer: Self-pay | Admitting: Internal Medicine

## 2018-05-28 ENCOUNTER — Ambulatory Visit (INDEPENDENT_AMBULATORY_CARE_PROVIDER_SITE_OTHER): Payer: Medicare Other | Admitting: Internal Medicine

## 2018-05-28 ENCOUNTER — Other Ambulatory Visit: Payer: Self-pay | Admitting: Internal Medicine

## 2018-05-28 VITALS — BP 120/64 | HR 56 | Temp 98.0°F | Ht 71.0 in | Wt 217.0 lb

## 2018-05-28 DIAGNOSIS — R05 Cough: Secondary | ICD-10-CM | POA: Diagnosis not present

## 2018-05-28 DIAGNOSIS — I2510 Atherosclerotic heart disease of native coronary artery without angina pectoris: Secondary | ICD-10-CM

## 2018-05-28 DIAGNOSIS — R059 Cough, unspecified: Secondary | ICD-10-CM

## 2018-05-28 MED ORDER — DOXYCYCLINE HYCLATE 100 MG PO TABS: 100.0000 mg | ORAL_TABLET | Freq: Two times a day (BID) | ORAL | 0 refills | Status: DC

## 2018-05-28 MED ORDER — METHYLPREDNISOLONE ACETATE 40 MG/ML IJ SUSP
40.0000 mg | Freq: Once | INTRAMUSCULAR | Status: AC
  Administered 2018-05-28: 40 mg via INTRAMUSCULAR

## 2018-05-28 NOTE — Progress Notes (Signed)
   Subjective:   Patient ID: [name] Glass, male    DOB: May 27, 1941, 77 y.o.   MRN: [id number]  HPI The patient is a 77 y.o. man coming in for cold symptoms. Started about 1 week ago. Main symptoms are: cough with yellow sputum, fatigue and aches. Denies improvement or fevers. He is getting SOB with exertion and at night time due to high sputum burden. Overall it is worsening. Has tried nyquil, dayquil, tylenol.   Review of Systems  Constitutional: Positive for activity change, appetite change, chills and fatigue. Negative for fever and unexpected weight change.  HENT: Positive for congestion, postnasal drip, rhinorrhea and sinus pressure. Negative for ear discharge, ear pain, sinus pain, sneezing, sore throat, tinnitus, trouble swallowing and voice change.   Eyes: Negative.   Respiratory: Positive for cough and shortness of breath. Negative for chest tightness and wheezing.   Cardiovascular: Negative.   Gastrointestinal: Negative.   Musculoskeletal: Positive for myalgias.  Neurological: Negative.     Objective:  Physical Exam Constitutional:      Appearance: He is well-developed.  HENT:     Head: Normocephalic and atraumatic.     Comments: Oropharynx with redness and clear drainage, nose with swollen turbinates, TMs normal bilaterally.  Neck:     Musculoskeletal: Normal range of motion.     Thyroid: No thyromegaly.  Cardiovascular:     Rate and Rhythm: Normal rate and regular rhythm.  Pulmonary:     Effort: Pulmonary effort is normal. No respiratory distress.     Breath sounds: Rhonchi present. No wheezing or rales.  Abdominal:     Palpations: Abdomen is soft.  Musculoskeletal:        General: Tenderness present.  Lymphadenopathy:     Cervical: No cervical adenopathy.  Skin:    General: Skin is warm and dry.  Neurological:     Mental Status: He is alert and oriented to person, place, and time.     Vitals:   05/28/18 0819  BP: 120/64  Pulse: (!) 56  Temp: 98 F (36.7  C)  TempSrc: Oral  SpO2: 94%  Weight: 217 lb (98.4 kg)  Height: 5\' 11"  (1.803 m)    Assessment & Plan:  Depo-medrol 40 mg IM

## 2018-05-28 NOTE — Patient Instructions (Signed)
We have sent in doxycycline to take 1 pill twice a day for 1 week.   We have given you a shot today to help the lungs get better quicker.

## 2018-05-28 NOTE — Assessment & Plan Note (Signed)
Rx for doxycycline 1 week and depo-medrol 40 mg IM given at visit given lung findings and duration.

## 2018-05-30 NOTE — Telephone Encounter (Signed)
Can you call him and see if he needs this? Not sure if he takes or pharmacy asking for refill?

## 2018-06-18 DIAGNOSIS — D229 Melanocytic nevi, unspecified: Secondary | ICD-10-CM | POA: Diagnosis not present

## 2018-06-18 DIAGNOSIS — L570 Actinic keratosis: Secondary | ICD-10-CM | POA: Diagnosis not present

## 2018-06-18 DIAGNOSIS — L720 Epidermal cyst: Secondary | ICD-10-CM | POA: Diagnosis not present

## 2018-06-20 DIAGNOSIS — T82858A Stenosis of vascular prosthetic devices, implants and grafts, initial encounter: Secondary | ICD-10-CM | POA: Diagnosis not present

## 2018-06-20 DIAGNOSIS — L7632 Postprocedural hematoma of skin and subcutaneous tissue following other procedure: Secondary | ICD-10-CM | POA: Diagnosis not present

## 2018-06-20 DIAGNOSIS — I712 Thoracic aortic aneurysm, without rupture: Secondary | ICD-10-CM | POA: Diagnosis not present

## 2018-06-20 DIAGNOSIS — R55 Syncope and collapse: Secondary | ICD-10-CM | POA: Diagnosis not present

## 2018-06-20 DIAGNOSIS — E782 Mixed hyperlipidemia: Secondary | ICD-10-CM | POA: Diagnosis not present

## 2018-06-20 DIAGNOSIS — I517 Cardiomegaly: Secondary | ICD-10-CM | POA: Diagnosis not present

## 2018-06-20 DIAGNOSIS — I499 Cardiac arrhythmia, unspecified: Secondary | ICD-10-CM | POA: Diagnosis not present

## 2018-06-20 DIAGNOSIS — I2510 Atherosclerotic heart disease of native coronary artery without angina pectoris: Secondary | ICD-10-CM | POA: Diagnosis not present

## 2018-06-20 DIAGNOSIS — I10 Essential (primary) hypertension: Secondary | ICD-10-CM | POA: Diagnosis not present

## 2018-06-20 DIAGNOSIS — I442 Atrioventricular block, complete: Secondary | ICD-10-CM | POA: Diagnosis not present

## 2018-06-20 DIAGNOSIS — I214 Non-ST elevation (NSTEMI) myocardial infarction: Secondary | ICD-10-CM | POA: Diagnosis not present

## 2018-06-20 DIAGNOSIS — I441 Atrioventricular block, second degree: Secondary | ICD-10-CM | POA: Diagnosis not present

## 2018-06-21 ENCOUNTER — Telehealth: Payer: Self-pay | Admitting: *Deleted

## 2018-06-21 DIAGNOSIS — R569 Unspecified convulsions: Secondary | ICD-10-CM | POA: Diagnosis present

## 2018-06-21 DIAGNOSIS — Z950 Presence of cardiac pacemaker: Secondary | ICD-10-CM | POA: Diagnosis not present

## 2018-06-21 DIAGNOSIS — I214 Non-ST elevation (NSTEMI) myocardial infarction: Secondary | ICD-10-CM | POA: Diagnosis present

## 2018-06-21 DIAGNOSIS — Z885 Allergy status to narcotic agent status: Secondary | ICD-10-CM | POA: Diagnosis not present

## 2018-06-21 DIAGNOSIS — Z7902 Long term (current) use of antithrombotics/antiplatelets: Secondary | ICD-10-CM | POA: Diagnosis not present

## 2018-06-21 DIAGNOSIS — I25119 Atherosclerotic heart disease of native coronary artery with unspecified angina pectoris: Secondary | ICD-10-CM | POA: Diagnosis present

## 2018-06-21 DIAGNOSIS — I2510 Atherosclerotic heart disease of native coronary artery without angina pectoris: Secondary | ICD-10-CM | POA: Diagnosis not present

## 2018-06-21 DIAGNOSIS — R001 Bradycardia, unspecified: Secondary | ICD-10-CM | POA: Diagnosis present

## 2018-06-21 DIAGNOSIS — I442 Atrioventricular block, complete: Secondary | ICD-10-CM | POA: Diagnosis present

## 2018-06-21 DIAGNOSIS — Z96659 Presence of unspecified artificial knee joint: Secondary | ICD-10-CM | POA: Diagnosis present

## 2018-06-21 DIAGNOSIS — Z23 Encounter for immunization: Secondary | ICD-10-CM | POA: Diagnosis not present

## 2018-06-21 DIAGNOSIS — I441 Atrioventricular block, second degree: Secondary | ICD-10-CM | POA: Diagnosis not present

## 2018-06-21 DIAGNOSIS — L7632 Postprocedural hematoma of skin and subcutaneous tissue following other procedure: Secondary | ICD-10-CM | POA: Diagnosis present

## 2018-06-21 DIAGNOSIS — Z7982 Long term (current) use of aspirin: Secondary | ICD-10-CM | POA: Diagnosis not present

## 2018-06-21 DIAGNOSIS — R55 Syncope and collapse: Secondary | ICD-10-CM | POA: Diagnosis not present

## 2018-06-21 DIAGNOSIS — I10 Essential (primary) hypertension: Secondary | ICD-10-CM | POA: Diagnosis present

## 2018-06-21 DIAGNOSIS — I455 Other specified heart block: Secondary | ICD-10-CM | POA: Diagnosis present

## 2018-06-21 DIAGNOSIS — R402142 Coma scale, eyes open, spontaneous, at arrival to emergency department: Secondary | ICD-10-CM | POA: Diagnosis present

## 2018-06-21 DIAGNOSIS — Z79811 Long term (current) use of aromatase inhibitors: Secondary | ICD-10-CM | POA: Diagnosis not present

## 2018-06-21 DIAGNOSIS — Z45018 Encounter for adjustment and management of other part of cardiac pacemaker: Secondary | ICD-10-CM | POA: Diagnosis not present

## 2018-06-21 DIAGNOSIS — R402252 Coma scale, best verbal response, oriented, at arrival to emergency department: Secondary | ICD-10-CM | POA: Diagnosis present

## 2018-06-21 DIAGNOSIS — E782 Mixed hyperlipidemia: Secondary | ICD-10-CM | POA: Diagnosis present

## 2018-06-21 DIAGNOSIS — T82858A Stenosis of vascular prosthetic devices, implants and grafts, initial encounter: Secondary | ICD-10-CM | POA: Diagnosis present

## 2018-06-21 DIAGNOSIS — R402362 Coma scale, best motor response, obeys commands, at arrival to emergency department: Secondary | ICD-10-CM | POA: Diagnosis present

## 2018-06-21 NOTE — Telephone Encounter (Signed)
LVM for patient to call back in regards to scheduling AWV with our health coach.  

## 2018-06-24 ENCOUNTER — Telehealth: Payer: Self-pay | Admitting: *Deleted

## 2018-06-24 NOTE — Telephone Encounter (Signed)
error 

## 2018-06-27 ENCOUNTER — Ambulatory Visit (INDEPENDENT_AMBULATORY_CARE_PROVIDER_SITE_OTHER): Payer: Medicare Other | Admitting: Internal Medicine

## 2018-06-27 ENCOUNTER — Ambulatory Visit (INDEPENDENT_AMBULATORY_CARE_PROVIDER_SITE_OTHER)
Admission: RE | Admit: 2018-06-27 | Discharge: 2018-06-27 | Disposition: A | Discharge status: Home / Self Care | Payer: Medicare Other | Source: Ambulatory Visit | Attending: Internal Medicine | Admitting: Internal Medicine

## 2018-06-27 ENCOUNTER — Ambulatory Visit: Payer: Medicare Other

## 2018-06-27 ENCOUNTER — Encounter: Payer: Self-pay | Admitting: Internal Medicine

## 2018-06-27 VITALS — BP 120/62 | HR 63 | Temp 98.5°F | Ht 71.0 in | Wt 218.0 lb

## 2018-06-27 DIAGNOSIS — L03113 Cellulitis of right upper limb: Secondary | ICD-10-CM | POA: Diagnosis not present

## 2018-06-27 DIAGNOSIS — I2510 Atherosclerotic heart disease of native coronary artery without angina pectoris: Secondary | ICD-10-CM

## 2018-06-27 DIAGNOSIS — M25561 Pain in right knee: Secondary | ICD-10-CM | POA: Diagnosis not present

## 2018-06-27 DIAGNOSIS — N400 Enlarged prostate without lower urinary tract symptoms: Secondary | ICD-10-CM

## 2018-06-27 DIAGNOSIS — S8991XA Unspecified injury of right lower leg, initial encounter: Secondary | ICD-10-CM

## 2018-06-27 DIAGNOSIS — E785 Hyperlipidemia, unspecified: Secondary | ICD-10-CM | POA: Diagnosis not present

## 2018-06-27 DIAGNOSIS — R972 Elevated prostate specific antigen [PSA]: Secondary | ICD-10-CM | POA: Diagnosis not present

## 2018-06-27 DIAGNOSIS — R7303 Prediabetes: Secondary | ICD-10-CM | POA: Diagnosis not present

## 2018-06-27 DIAGNOSIS — N182 Chronic kidney disease, stage 2 (mild): Secondary | ICD-10-CM

## 2018-06-27 DIAGNOSIS — I10 Essential (primary) hypertension: Secondary | ICD-10-CM | POA: Diagnosis not present

## 2018-06-27 DIAGNOSIS — K219 Gastro-esophageal reflux disease without esophagitis: Secondary | ICD-10-CM

## 2018-06-27 MED ORDER — DOXYCYCLINE HYCLATE 100 MG PO TABS: 100.0000 mg | ORAL_TABLET | Freq: Two times a day (BID) | ORAL | 0 refills | Status: AC

## 2018-06-27 MED ORDER — PITAVASTATIN CALCIUM 4 MG PO TABS: 1.0000 | ORAL_TABLET | Freq: Every day | ORAL | 1 refills | Status: DC

## 2018-06-27 NOTE — Progress Notes (Signed)
Subjective:  Patient ID: [name] Glass, male    DOB: [birth year]  Age: 78 y.o. MRN: [id number]  CC: Coronary Artery Disease and Hyperlipidemia   HPI SHELL YANDOW presents for f/up - About 1 to 2 weeks ago he was at 8000 feet elevation in [facility] and he went into respiratory distress, his blood pressure spiked, and he sustained a fall injuring his right knee.  He was taken to a local hospital and underwent a cardiac cath after he was told that he had had an MI.  He was told that he had a 95% lesion in his LAD which was stented.  The next day he developed significant pauses and had a temporary pacemaker placed.  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  This information is obtained from his report.  I do not have any records today. He has not had any recurrent episodes of chest pain or shortness of breath.  Brilinta has been added.  Today he complains of pain, redness, swelling in his right forearm at the site of an IV.  He also complains of pain and swelling in his right knee.  He had lab work done about a month ago by a physician in [place].  He had not achieved his LDL goal and wants to increase the dose of his statin.  Outpatient Medications Prior to Visit  Medication Sig Dispense Refill  . acetaminophen (TYLENOL) 650 MG CR tablet Take 1,300 mg by mouth at bedtime. Reported on 12/16/2015    . acidophilus (RISAQUAD) CAPS capsule Take 1 capsule by mouth 2 (two) times daily.    [name] acyclovir (ZOVIRAX) 200 MG capsule TAKE 1 CAPSULE (200 MG TOTAL) BY MOUTH 3 (THREE) TIMES DAILY. 270 capsule 0  . anastrozole (ARIMIDEX) 1 MG tablet TAKE 1 TABLET 4 TIMES A WEEK    . aspirin EC 81 MG tablet Take 1 tablet (81 mg total) by mouth daily. 90 tablet 3  . BRILINTA 90 MG TABS tablet TK 1 T PO BID    . DEXILANT 60 MG capsule Take 1 capsule by mouth daily.  1  . ezetimibe (ZETIA) 10 MG tablet TAKE 1 TABLET BY MOUTH EVERY DAY 90 tablet 1  . fluticasone (FLONASE) 50 MCG/ACT nasal spray USE DAILY  AS DIRECTED 48 g 1  . liothyronine (CYTOMEL) 25 MCG tablet TAKE 1 TABLET UPON WAKING.  3  . NON FORMULARY Decoanate Injection    . Nutritional Supplements (DHEA PO) Take 25 mg by mouth.    . Plant Sterols and Stanols (CHOLEST OFF PO) Take 900 mg by mouth.    . potassium gluconate 595 MG TABS Take 595 mg by mouth every evening.    . tamsulosin (FLOMAX) 0.4 MG CAPS capsule TAKE 1 CAPSULE BY MOUTH EVERY DAY IN THE MORNING  5  . testosterone cypionate (DEPOTESTOTERONE CYPIONATE) 100 MG/ML injection Inject into the muscle once a week. For IM use only    . thyroid (ARMOUR) 60 MG tablet Take 60-120 mg by mouth 2 (two) times daily.     [name] doxycycline (VIBRA-TABS) 100 MG tablet Take 1 tablet (100 mg total) by mouth 2 (two) times daily. 14 tablet 0  . Pitavastatin Calcium (LIVALO) 1 MG TABS Take 1 tablet (1 mg total) by mouth daily. 90 tablet 1   No facility-administered medications prior to visit.     ROS Review of Systems  Constitutional: Negative.  Negative for chills, diaphoresis, fatigue and fever.  HENT: Negative.  Negative for trouble  swallowing.   Eyes: Negative for visual disturbance.  Respiratory: Negative for cough, chest tightness and shortness of breath.   Cardiovascular: Negative for chest pain, palpitations and leg swelling.  Gastrointestinal: Negative for abdominal pain, constipation, diarrhea, nausea and vomiting.  Genitourinary: Negative.  Negative for difficulty urinating.  Musculoskeletal: Positive for arthralgias. Negative for myalgias and neck pain.  Skin: Positive for color change. Negative for rash.  Neurological: Negative.  Negative for dizziness, weakness and light-headedness.  Hematological: Negative for adenopathy. Does not bruise/bleed easily.  Psychiatric/Behavioral: Negative.     Objective:  BP 120/62 (BP Location: Left Arm, Patient Position: Sitting, Cuff Size: Normal)   Pulse 63   Temp 98.5 F (36.9 C) (Oral)   Ht 5\' 11"  (1.803 m)   Wt 218 lb (98.9 kg)    SpO2 94%   BMI 30.40 kg/m   BP Readings from Last 3 Encounters:  06/27/18 120/62  05/28/18 120/64  02/27/18 132/76    Wt Readings from Last 3 Encounters:  06/27/18 218 lb (98.9 kg)  05/28/18 217 lb (98.4 kg)  02/27/18 222 lb (100.7 kg)    Physical Exam Vitals signs reviewed.  Constitutional:      General: He is not in acute distress.    Appearance: He is not ill-appearing, toxic-appearing or diaphoretic.  HENT:     Nose: Nose normal. No congestion or rhinorrhea.     Mouth/Throat:     Mouth: Mucous membranes are moist.     Pharynx: No oropharyngeal exudate or posterior oropharyngeal erythema.  Eyes:     General: No scleral icterus.    Conjunctiva/sclera: Conjunctivae normal.     Pupils: Pupils are equal, round, and reactive to light.  Neck:     Musculoskeletal: Normal range of motion. No neck rigidity or muscular tenderness.     Vascular: No carotid bruit.  Cardiovascular:     Rate and Rhythm: Normal rate and regular rhythm.     Heart sounds: Murmur present. Systolic murmur present with a grade of 1/6. No diastolic murmur. No friction rub.  Pulmonary:     Effort: Pulmonary effort is normal.     Breath sounds: No stridor. No wheezing, rhonchi or rales.  Abdominal:     General: Abdomen is flat. Bowel sounds are normal.     Palpations: There is no hepatomegaly or splenomegaly.     Tenderness: There is no abdominal tenderness. There is no guarding.  Musculoskeletal:        General: No swelling.     Right knee: He exhibits swelling. He exhibits normal range of motion, no effusion, no deformity, no erythema, normal alignment, no LCL laxity and no bony tenderness. No tenderness found.       Arms:     Right lower leg: No edema.     Left lower leg: No edema.  Lymphadenopathy:     Cervical: No cervical adenopathy.  Skin:    General: Skin is warm and dry.     Findings: Erythema present.  Neurological:     General: No focal deficit present.     Mental Status: He is  oriented to person, place, and time. Mental status is at baseline.     Lab Results  Component Value Date   WBC 5.2 05/24/2018   HGB 18.3 (A) 05/24/2018   HCT 55 (A) 05/24/2018   PLT 300 07/21/2017   GLUCOSE 102 (H) 06/22/2017   CHOL 189 05/24/2018   TRIG 137 05/24/2018   HDL 48 05/24/2018   LDLCALC 114  05/24/2018   ALT 22 05/24/2018   AST 22 05/24/2018   NA 144 05/24/2018   K 4.9 05/24/2018   CL 103 06/22/2017   CREATININE 1.2 05/24/2018   BUN 13 05/24/2018   CO2 30 06/22/2017   TSH 1.45 05/24/2018   PSA 4.4 05/24/2018   INR 0.96 07/28/2014   HGBA1C 6.1 07/21/2017    Ct Soft Tissue Neck W Contrast  Result Date: 07/06/2017 CLINICAL DATA:  Initial evaluation for nontender anterior neck mass. EXAM: CT NECK WITH CONTRAST TECHNIQUE: Multidetector CT imaging of the neck was performed using the standard protocol following the bolus administration of intravenous contrast. CONTRAST:  44mL ISOVUE-300 IOPAMIDOL (ISOVUE-300) INJECTION 61% COMPARISON:  None available. FINDINGS: Pharynx and larynx: Oral cavity within normal limits without mass lesion or loculated fluid collection. No acute abnormality about the dentition. Palatine tonsils symmetric and within normal limits. Parapharyngeal fat preserved. Nasopharynx normal. Retropharyngeal soft tissues normal. Epiglottis within normal limits. Vallecula clear. Remainder of the hypopharynx and supraglottic larynx within normal limits. True cords apposed and not well evaluated. Subglottic airway clear. Salivary glands: Metallic BB marker overlies the left anterior neck at the palpable area concern. This overlies the left submandibular gland, which is positioned immediately subjacent to the marker BB. The gland itself is normal in appearance without mass lesion or acute inflammatory changes. No other mass lesion or other abnormality within this region to correspond with palpable abnormality. Remainder of the salivary glands including the parotid and right  submandibular gland are normal. Thyroid: Thyroid normal. Lymph nodes: No pathologically enlarged lymph nodes identified within the neck. Vascular: Normal intravascular enhancement seen throughout the neck. Scattered atherosclerotic plaque about the carotid bifurcations and aortic arch noted. Limited intracranial: Unremarkable. Visualized orbits: Partially visualized globes and orbital soft tissues within normal limits. Patient status post lens extraction bilaterally. Mastoids and visualized paranasal sinuses: Visualized paranasal sinuses are clear. Mastoid air cells are well pneumatized and clear. Skeleton: No acute osseus abnormality. No worrisome lytic or blastic osseous lesions. Moderate multilevel degenerative spondylolysis and facet arthrosis throughout the cervical spine, most notable at C5-6 and C6-7. Upper chest: Visualized upper chest within normal limits. Visualized lungs are clear. Other: None. IMPRESSION: 1. No mass lesion or adenopathy identified within the neck. Palpable area of concern appears to overlie the left submandibular gland which is normal in appearance. 2. No other acute abnormality identified within the neck. Electronically Signed   By: Jeannine Boga M.D.   On: 07/06/2017 16:12    Assessment & Plan:   Merek was seen today for coronary artery disease and hyperlipidemia.  Diagnoses and all orders for this visit:  Coronary artery disease involving native coronary artery of native heart without angina pectoris- He will be seeing his local cardiologist soon. -     Lipid panel; Future -     Pitavastatin Calcium (LIVALO) 4 MG TABS; Take 1 tablet (4 mg total) by mouth daily.  Essential hypertension, benign- His blood pressure is adequately well controlled. -     CBC with Differential/Platelet; Future -     Comprehensive metabolic panel; Future  Gastroesophageal reflux disease without esophagitis -     CBC with Differential/Platelet; Future  Benign prostatic hyperplasia  without lower urinary tract symptoms -     Cancel: PSA, total and free; Future  Chronic renal disease, stage 2, mildly decreased glomerular filtration rate (GFR) between 60-89 mL/min/1.73 square meter -     Comprehensive metabolic panel; Future -     Urinalysis, Routine w reflex  microscopic; Future  PSA elevation- His PSA is trended down over the last year.  This is reassuring that he does not have prostate cancer. -     Cancel: PSA, total and free; Future  Hyperlipidemia with target LDL less than 70- He has not achieved his LDL goal.  I will increase the dose of the statin. -     Comprehensive metabolic panel; Future -     TSH; Future -     Pitavastatin Calcium (LIVALO) 4 MG TABS; Take 1 tablet (4 mg total) by mouth daily.  Prediabetes -     Hemoglobin A1c; Future  Cellulitis of forearm, right -     doxycycline (VIBRA-TABS) 100 MG tablet; Take 1 tablet (100 mg total) by mouth 2 (two) times daily for 10 days.  Right knee injury, initial encounter- Plain films are negative for fracture or disruption of his artificial knee.  This is consistent with a sprain/contusion.  He will rest ice and elevate as much as possible. -     DG Knee Complete 4 Views Right; Future   I have discontinued Rutilio Yellowhair. Wade's Pitavastatin Calcium. I have also changed his doxycycline. Additionally, I am having him start on Pitavastatin Calcium. Lastly, I am having him maintain his potassium gluconate, thyroid, acidophilus, acetaminophen, testosterone cypionate, DEXILANT, Plant Sterols and Stanols (CHOLEST OFF PO), Nutritional Supplements (DHEA PO), liothyronine, anastrozole, NON FORMULARY, aspirin EC, tamsulosin, fluticasone, ezetimibe, acyclovir, and BRILINTA.  Meds ordered this encounter  Medications  . doxycycline (VIBRA-TABS) 100 MG tablet    Sig: Take 1 tablet (100 mg total) by mouth 2 (two) times daily for 10 days.    Dispense:  20 tablet    Refill:  0  . Pitavastatin Calcium (LIVALO) 4 MG TABS    Sig:  Take 1 tablet (4 mg total) by mouth daily.    Dispense:  90 tablet    Refill:  1     Follow-up: Return in about 3 weeks (around 07/18/2018).  Scarlette Calico, MD

## 2018-06-27 NOTE — Progress Notes (Unsigned)
Subjective:   [name] Glass is a 78 y.o. male who presents for Medicare Annual/Subsequent preventive examination.  Review of Systems:  No ROS.  Medicare Wellness Visit. Additional risk factors are reflected in the social history.    Sleep patterns: {SX; SLEEP PATTERNS:18802::"feels rested on waking","does not get up to void","gets up *** times nightly to void","sleeps *** hours nightly"}.    Home Safety/Smoke Alarms: Feels safe in home. Smoke alarms in place.  Living environment; residence and Firearm Safety: {Rehab home environment / accessibility:30080::"no firearms","firearms stored safely"}. Seat Belt Safety/Bike Helmet: Wears seat belt.   PSA-  Lab Results  Component Value Date   PSA 5.3 07/31/2017   PSA 11.54 (H) 06/22/2017   PSA 5.7 11/17/2015       Objective:    Vitals: There were no vitals taken for this visit.  There is no height or weight on file to calculate BMI.  Advanced Directives 08/19/2014 08/03/2014 08/03/2014 07/28/2014 08/02/2012  Does Patient Have a Medical Advance Directive? Yes Yes Yes Yes Patient has advance directive, copy not in chart  Type of Advance Directive [place];Living will [place];Living will [place];Living will [place];Living will [place];Living will  Does patient want to make changes to medical advance directive? - No - Patient declined - No - Patient declined -  Copy of [place] in Chart? - No - copy requested No - copy requested No - copy requested Copy requested from family  Pre-existing out of facility DNR order (yellow form or pink MOST form) - - - - No    Tobacco Social History   Tobacco Use  Smoking Status Never Smoker  Smokeless Tobacco Never Used     Counseling given: Not Answered  Past Medical History:  Diagnosis Date  . Arthritis   . Coronary artery disease   . GERD (gastroesophageal reflux disease)    . H/O bronchitis   . H/O hiatal hernia   . History of kidney stones last in 1976  . Hyperlipidemia    statin intolerant, under control  . Hypertension    under control  . Hypothyroidism   . Sleep apnea    hx of, surgery to reverse   Past Surgical History:  Procedure Laterality Date  . CARDIAC CATHETERIZATION Right 2008   5 heart stents  . CATARACT EXTRACTION Bilateral 6 years ago  . CYSTOSCOPY N/A 08/06/2012   Procedure: CYSTOSCOPY;  Surgeon: [name], MD;  Location: [facility];  Service: Urology;  Laterality: N/A;  . ESOPHAGOGASTRIC FUNDOPLICATION  6160   spleen removed, 5 pints of blood given  . HERNIA REPAIR  1985   x4  . INGUINAL HERNIA REPAIR  1992  . PROSTATE ABLATION  2013   "laser ablation"  . TONSILLECTOMY  as child  . TOTAL KNEE ARTHROPLASTY Right 08/03/2014   Procedure: TOTAL RIGHT KNEE ARTHROPLASTY;  Surgeon: [name], MD;  Location: [facility];  Service: Orthopedics;  Laterality: Right;  . TRANSURETHRAL RESECTION OF PROSTATE N/A 08/06/2012   Procedure: [place] Free OF PROSTATE WITH GYRUS ;  Surgeon: [name], MD;  Location: [facility];  Service: Urology;  Laterality: N/A;  . UMBILICAL HERNIA REPAIR  1990  . UVULECTOMY  2005   for sleep apnea  . UVULOPALATOPHARYNGOPLASTY  6 years ago   Family History  Problem Relation Age of Onset  . Heart attack Father 63       lived to 39  .  Alzheimer's disease Mother   . Stroke Mother   . Breast cancer Mother   . Alzheimer's disease Maternal Grandmother   . Stroke Maternal Grandmother   . Lung cancer Maternal Grandmother   . Lung cancer Maternal Grandfather   . Cancer Paternal Grandmother        type unknown  . Diabetes Paternal Grandfather   . Other Paternal Grandfather        Growth in the back of the head   Social History   Socioeconomic History  . Marital status: Divorced    Spouse name: Not on file  . Number of children: 0  . Years of education: Not on file  . Highest education level: Not on file    Occupational History  . Occupation: Retired   Scientific laboratory technician  . Financial resource strain: Not on file  . Food insecurity:    Worry: Not on file    Inability: Not on file  . Transportation needs:    Medical: Not on file    Non-medical: Not on file  Tobacco Use  . Smoking status: Never Smoker  . Smokeless tobacco: Never Used  Substance and Sexual Activity  . Alcohol use: Yes    Alcohol/week: 4.0 standard drinks    Types: 4 Standard drinks or equivalent per week    Comment: 3 drinks at night   . Drug use: No  . Sexual activity: Not on file  Lifestyle  . Physical activity:    Days per week: Not on file    Minutes per session: Not on file  . Stress: Not on file  Relationships  . Social connections:    Talks on phone: Not on file    Gets together: Not on file    Attends religious service: Not on file    Active member of club or organization: Not on file    Attends meetings of clubs or organizations: Not on file    Relationship status: Not on file  Other Topics Concern  . Not on file  Social History Narrative  . Not on file    Outpatient Encounter Medications as of 06/27/2018  Medication Sig  . acetaminophen (TYLENOL) 650 MG CR tablet Take 1,300 mg by mouth at bedtime. Reported on 12/16/2015  . acidophilus (RISAQUAD) CAPS capsule Take 1 capsule by mouth 2 (two) times daily.  [name] acyclovir (ZOVIRAX) 200 MG capsule TAKE 1 CAPSULE (200 MG TOTAL) BY MOUTH 3 (THREE) TIMES DAILY.  [name] anastrozole (ARIMIDEX) 1 MG tablet TAKE 1 TABLET 4 TIMES A WEEK  . aspirin EC 81 MG tablet Take 1 tablet (81 mg total) by mouth daily.  [name] DEXILANT 60 MG capsule Take 1 capsule by mouth daily.  [name] doxycycline (VIBRA-TABS) 100 MG tablet Take 1 tablet (100 mg total) by mouth 2 (two) times daily.  [name] ezetimibe (ZETIA) 10 MG tablet TAKE 1 TABLET BY MOUTH EVERY DAY  . fluticasone (FLONASE) 50 MCG/ACT nasal spray USE DAILY AS DIRECTED  . liothyronine (CYTOMEL) 25 MCG tablet TAKE 1 TABLET UPON WAKING.  . NON  FORMULARY Decoanate Injection  . Nutritional Supplements (DHEA PO) Take 25 mg by mouth.  . Pitavastatin Calcium (LIVALO) 1 MG TABS Take 1 tablet (1 mg total) by mouth daily.  . Plant Sterols and Stanols (CHOLEST OFF PO) Take 900 mg by mouth.  . potassium gluconate 595 MG TABS Take 595 mg by mouth every evening.  . tamsulosin (FLOMAX) 0.4 MG CAPS capsule TAKE 1 CAPSULE BY MOUTH EVERY DAY IN THE  MORNING  . testosterone cypionate (DEPOTESTOTERONE CYPIONATE) 100 MG/ML injection Inject into the muscle once a week. For IM use only  . thyroid (ARMOUR) 60 MG tablet Take 60-120 mg by mouth 2 (two) times daily.    No facility-administered encounter medications on file as of 06/27/2018.     Activities of Daily Living No flowsheet data found.  Patient Care Team: [name], MD as PCP - General (Internal Medicine)   Assessment:   This is a routine wellness examination for Zacharia. Physical assessment deferred to PCP.  Exercise Activities and Dietary recommendations   Diet (meal preparation, eat out, water intake, caffeinated beverages, dairy products, fruits and vegetables): {Desc; diets:16563}   Goals   None     Fall Risk Fall Risk  06/26/2017 12/25/2016  Falls in the past year? No No  Comment - Emmi Telephone Survey: data to providers prior to load    Depression Screen PHQ 2/9 Scores 06/26/2017  PHQ - 2 Score 0    Cognitive Function        Immunization History  Administered Date(s) Administered  . Influenza, High Dose Seasonal PF 07/13/2016, 04/19/2017  . Pneumococcal Conjugate-13 06/26/2017  . Pneumococcal Polysaccharide-23 12/16/2015  . Tdap 06/26/2017   Screening Tests Health Maintenance  Topic Date Due  . INFLUENZA VACCINE  09/04/2018 (Originally 01/03/2018)  . TETANUS/TDAP  06/27/2027  . PNA vac Low Risk Adult  Completed       Plan:     I have personally reviewed and noted the following in the patient's chart:   . Medical and social history . Use of alcohol,  tobacco or illicit drugs  . Current medications and supplements . Functional ability and status . Nutritional status . Physical activity . Advanced directives . List of other physicians . Vitals . Screenings to include cognitive, depression, and falls . Referrals and appointments  In addition, I have reviewed and discussed with patient certain preventive protocols, quality metrics, and best practice recommendations. A written personalized care plan for preventive services as well as general preventive health recommendations were provided to patient.     Michiel Cowboy, RN  06/27/2018

## 2018-06-27 NOTE — Patient Instructions (Signed)

## 2018-07-02 ENCOUNTER — Encounter: Payer: Self-pay | Admitting: Cardiovascular Disease

## 2018-07-02 ENCOUNTER — Ambulatory Visit (INDEPENDENT_AMBULATORY_CARE_PROVIDER_SITE_OTHER): Payer: Medicare Other | Admitting: Cardiovascular Disease

## 2018-07-02 DIAGNOSIS — R55 Syncope and collapse: Secondary | ICD-10-CM | POA: Diagnosis not present

## 2018-07-02 DIAGNOSIS — I2510 Atherosclerotic heart disease of native coronary artery without angina pectoris: Secondary | ICD-10-CM

## 2018-07-02 DIAGNOSIS — E785 Hyperlipidemia, unspecified: Secondary | ICD-10-CM | POA: Diagnosis not present

## 2018-07-02 NOTE — Patient Instructions (Addendum)
Medication Instructions:  NONE If you need a refill on your cardiac medications before your next appointment, please call your pharmacy.   Lab work: Your physician recommends that you return for lab work in: [place]  If you have labs (blood work) drawn today and your tests are completely normal, you will receive your results only by: [name] MyChart Message (if you have MyChart) OR . A paper copy in the mail If you have any lab test that is abnormal or we need to change your treatment, we will call you to review the results.  Testing/Procedures: NONE  Follow-Up: At [facility], you and your health needs are our priority.  As part of our continuing mission to provide you with exceptional heart care, we have created designated Provider Care Teams.  These Care Teams include your primary Cardiologist (physician) and Advanced Practice Providers (APPs -  Physician Assistants and Nurse Practitioners) who all work together to provide you with the care you need, when you need it. . You will need a follow up appointment in 6 months with an APP and 12 months with Dr. [name].  Please call our office 2 months in advance to schedule each appointment.  You may see Dr. [name] or one of the following Advanced Practice Providers on your designated Care Team:   . Kerin Ransom, [place] . Almyra Deforest, PA-C . Fabian Sharp, PA-C . Jory Sims, DNP . Rosaria Ferries, PA-C . Roby Lofts, PA-C . Sande Rives, PA-C  Any Other Special Instructions Will Be Listed Below (If Applicable). FOLLOW UP WITH ELECTROPHYSIOLOGIST (EP) DR. Rayann Heman IF POSSIBLE TO ESTABLISH CARE; DISCUSS PACEMAKER, AND HAVE YOUR POCKET STAPLES REMOVED. YOUR STAPLES CAN BE REMOVED BY A PHYSICIAN ASSISTANT IN THE EP OFFICE.  YOU WILL RECEIVE A CALL FROM A CLINICAL PHARMACIST TO REMIND YOU TO DO BLOOD WORK IN 8 WEEKS

## 2018-07-02 NOTE — Assessment & Plan Note (Signed)
[name] Glass was recently out in [employer].  He had an episode of witnessed syncope.  He underwent evaluation at [facility] where he was found to have a non-STEMI and underwent catheterization, atherectomy and stenting of his LAD.  He was also noted to have long pauses with recurrent syncope and ultimately underwent temporary transvenous pacemaker insertion followed by permanent transvenous pacemaker insertion.  He still has pocket staples in.  Going to establish him with 1 of our EP physicians for ongoing care of his pacemaker and for removal of his pacemaker pocket staples.

## 2018-07-02 NOTE — Assessment & Plan Note (Signed)
History of hyperlipidemia on Livalo which was increased from 1 to 4 mg a day.  He was not at goal.  We will discuss beginning [place].

## 2018-07-02 NOTE — Assessment & Plan Note (Signed)
History of CAD status post circumflex and RCA a stenting by myself 06/09/2004.  He had a negative stress test 07/16/2014 which was preoperatively done for a right total knee replacement.  He was recently out in [place] and had episode of syncope and non-STEMI.  He went to [facility] and underwent cardiac catheterization by Dr. Earlie Counts who found a 95% calcified LAD lesion.  He had orbital atherectomy followed by drug-eluting stent implantation.  Apparently the previously placed stents were patent.  He is on aspirin and Brilinta.
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
## 2018-07-04 DIAGNOSIS — S8001XD Contusion of right knee, subsequent encounter: Secondary | ICD-10-CM | POA: Diagnosis not present

## 2018-07-04 DIAGNOSIS — Z96651 Presence of right artificial knee joint: Secondary | ICD-10-CM | POA: Diagnosis not present

## 2018-07-05 ENCOUNTER — Ambulatory Visit (INDEPENDENT_AMBULATORY_CARE_PROVIDER_SITE_OTHER): Payer: Medicare Other | Admitting: Internal Medicine

## 2018-07-05 ENCOUNTER — Encounter: Payer: Self-pay | Admitting: Internal Medicine

## 2018-07-05 VITALS — BP 144/82 | HR 89 | Ht 71.0 in | Wt 216.2 lb

## 2018-07-05 DIAGNOSIS — I2510 Atherosclerotic heart disease of native coronary artery without angina pectoris: Secondary | ICD-10-CM

## 2018-07-05 DIAGNOSIS — Z950 Presence of cardiac pacemaker: Secondary | ICD-10-CM

## 2018-07-05 DIAGNOSIS — R55 Syncope and collapse: Secondary | ICD-10-CM

## 2018-07-05 NOTE — Patient Instructions (Addendum)
Medication Instructions:  Your physician recommends that you continue on your current medications as directed. Please refer to the Current Medication list given to you today.  Labwork: None ordered.  Testing/Procedures: None ordered.  Follow-Up: Your physician recommends that you schedule a follow-up appointment in:   Thurs Feb 6 @ 1:45pm with Jonnell Hentges RN for staple removal.  Thurs May 7th @ 1:45pm Dr Caryl Comes for a device check.   Any Other Special Instructions Will Be Listed Below (If Applicable).     If you need a refill on your cardiac medications before your next appointment, please call your pharmacy.    Pacemaker Implantation, Adult, Care After This sheet gives you information about how to care for yourself after your procedure. Your health care provider may also give you more specific instructions. If you have problems or questions, contact your health care provider. What can I expect after the procedure? After the procedure, it is common to have:  Mild pain.  Slight bruising.  Some swelling over the incision.  A slight bump over the skin where the device was placed. Sometimes, it is possible to feel the device under the skin. This is normal. Follow these instructions at home: Medicines  Take over-the-counter and prescription medicines only as told by your health care provider.  If you were prescribed an antibiotic medicine, take it as told by your health care provider. Do not stop taking the antibiotic even if you start to feel better. Wound care   Do not remove the bandage on your chest until directed to do so by your health care provider.  After your bandage is removed, you may see pieces of tape called skin adhesive strips over the area where the cut was made (incision site). Let them fall off on their own.  Check the incision site every day to make sure it is not infected, bleeding, or starting to pull apart.  Do not use lotions or ointments near the  incision site unless directed to do so.  Keep the incision area clean and dry for 2-3 days after the procedure or as directed by your health care provider. It takes several weeks for the incision site to completely heal.  Do not take baths, swim, or use a hot tub for 7-10 days or as otherwise directed by your health care provider. Activity  Do not drive or use heavy machinery while taking prescription pain medicine.  Do not drive for 24 hours if you were given a medicine to help you relax (sedative).  Check with your health care provider before you start to drive or play sports.  Avoid sudden jerking, pulling, or chopping movements that pull your upper arm far away from your body. Avoid these movements for at least 6 weeks or as long as told by your health care provider.  Do not lift your upper arm above your shoulders for at least 6 weeks or as long as told by your health care provider. This means no tennis, golf, or swimming.  You may go back to work when your health care provider says it is okay. Pacemaker care  You may be shown how to transfer data from your pacemaker through the phone to your health care provider.  Always let all health care providers know about your pacemaker before you have any medical procedures or tests.  Wear a medical ID bracelet or necklace stating that you have a pacemaker. Carry a pacemaker ID card with you at all times.  Your pacemaker battery will  last for 5-15 years. Routine checks by your health care provider will let the health care provider know when the battery is starting to run down. The pacemaker will need to be replaced when the battery starts to run down.  Do not use amateur Chief of Staff. Other electrical devices are safe to use, including power tools, lawn mowers, and speakers. If you are unsure of whether something is safe to use, ask your health care provider.  When using your cell phone, hold it to the ear  opposite the pacemaker. Do not leave your cell phone in a pocket over the pacemaker.  Avoid places or objects that have a strong electric or magnetic field, including: ? Airport Herbalist. When at the airport, let officials know that you have a pacemaker. ? Power plants. ? Large electrical generators. ? Radiofrequency transmission towers, such as cell phone and radio towers. General instructions  Weigh yourself every day. If you suddenly gain weight, fluid may be building up in your body.  Keep all follow-up visits as told by your health care provider. This is important. Contact a health care provider if:  You gain weight suddenly.  Your legs or feet swell.  It feels like your heart is fluttering or skipping beats (heart palpitations).  You have chills or a fever.  You have more redness, swelling, or pain around your incisions.  You have more fluid or blood coming from your incisions.  Your incisions feel warm to the touch.  You have pus or a bad smell coming from your incisions. Get help right away if:  You have chest pain.  You have trouble breathing or are short of breath.  You become extremely tired.  You are light-headed or you faint. This information is not intended to replace advice given to you by your health care provider. Make sure you discuss any questions you have with your health care provider. Document Released: 12/09/2004 Document Revised: 03/03/2016 Document Reviewed: 03/03/2016 Elsevier Interactive Patient Education  2019 [employer].
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
## 2018-07-11 ENCOUNTER — Other Ambulatory Visit: Payer: Self-pay | Admitting: Dermatology

## 2018-07-11 ENCOUNTER — Ambulatory Visit (INDEPENDENT_AMBULATORY_CARE_PROVIDER_SITE_OTHER): Payer: Medicare Other

## 2018-07-11 DIAGNOSIS — Z5189 Encounter for other specified aftercare: Secondary | ICD-10-CM

## 2018-07-11 DIAGNOSIS — I2510 Atherosclerotic heart disease of native coronary artery without angina pectoris: Secondary | ICD-10-CM | POA: Diagnosis not present

## 2018-07-11 DIAGNOSIS — L720 Epidermal cyst: Secondary | ICD-10-CM | POA: Diagnosis not present

## 2018-07-11 DIAGNOSIS — Z950 Presence of cardiac pacemaker: Secondary | ICD-10-CM | POA: Diagnosis not present

## 2018-07-11 NOTE — Progress Notes (Signed)
Pt presents today for a pacemaker pocket incision assessment and staple removal. Left lateral side of incision is well approximated with no redness, edema, or drainage. Right medial side of incision has a small scab and overlap of skin. No redness, swelling or drainage noted. Dr Caryl Comes assessed and removed all 4 staples and is pleased with overall healing. Pt agrees to return to the office for a wound check on 2/18. Pt understands if his wound does not appear to be closing, becomes inflamed, has drainage and/or pain, he is to call the office asap.

## 2018-07-23 ENCOUNTER — Ambulatory Visit (INDEPENDENT_AMBULATORY_CARE_PROVIDER_SITE_OTHER): Payer: Medicare Other

## 2018-07-23 ENCOUNTER — Telehealth: Payer: Self-pay | Admitting: Cardiovascular Disease

## 2018-07-23 DIAGNOSIS — Z5189 Encounter for other specified aftercare: Secondary | ICD-10-CM

## 2018-07-23 DIAGNOSIS — Z950 Presence of cardiac pacemaker: Secondary | ICD-10-CM

## 2018-07-23 DIAGNOSIS — L723 Sebaceous cyst: Secondary | ICD-10-CM | POA: Diagnosis not present

## 2018-07-23 NOTE — Progress Notes (Signed)
Pt presents today for a wound check after staples were removed 2 weeks ago. Surgical wound appears WNL with well approximated edges, no swelling, redness, or tenderness. Pt states he has minimal soreness.   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 [employer] representative interrogated pt's device and noted normal device parameters. It is not believed pts SOB is coming from his device settings.   I advised pt to speak with Dr [name] to discuss whether or not his Brilinta could possibly be causing his symptoms. I also advised him to drink caffeine when he takes his morning dose of Brilinta.   Pt was also educated on movement restrictions as: no lifting, pulling, pushing anything > 10lbs until 6 weeks after his implant. Then no movement restrictions.  Pt has verbalized understanding and had no additional questions.

## 2018-07-23 NOTE — Telephone Encounter (Signed)
Pt c/o medication issue:  1. Name of Medication: BRILINTA 90 MG TABS tablet  2. How are you currently taking this medication (dosage and times per day)? 90 mg 2x daily  3. Are you having a reaction (difficulty breathing--STAT)? yes  4. What is your medication issue? SOB.  Pt was informed SOB was a temporary side effect, but it has not gone away. He has been on the medication for 5 weeks

## 2018-07-23 NOTE — Telephone Encounter (Signed)
Patient called, states he has short of breathe anytime of the day-  Patient states it occur all times a day - if resting or  Moving around  RN recommend patient -t ake some caffiene with  Morning  Dose for next few days if continue contact office. Patient states he had pacer interrogated today and the nurse today him the same information. Per patient no changes were made to device. - he verbalized understanding

## 2018-08-13 ENCOUNTER — Encounter: Payer: Self-pay | Admitting: Internal Medicine

## 2018-08-13 ENCOUNTER — Ambulatory Visit (INDEPENDENT_AMBULATORY_CARE_PROVIDER_SITE_OTHER): Payer: Medicare Other | Admitting: Internal Medicine

## 2018-08-13 ENCOUNTER — Ambulatory Visit (INDEPENDENT_AMBULATORY_CARE_PROVIDER_SITE_OTHER)
Admission: RE | Admit: 2018-08-13 | Discharge: 2018-08-13 | Disposition: A | Discharge status: Home / Self Care | Payer: Medicare Other | Source: Ambulatory Visit | Attending: Internal Medicine | Admitting: Internal Medicine

## 2018-08-13 VITALS — BP 140/80 | HR 77 | Temp 98.5°F | Ht 71.0 in | Wt 213.5 lb

## 2018-08-13 DIAGNOSIS — I2510 Atherosclerotic heart disease of native coronary artery without angina pectoris: Secondary | ICD-10-CM

## 2018-08-13 DIAGNOSIS — R6889 Other general symptoms and signs: Secondary | ICD-10-CM | POA: Diagnosis not present

## 2018-08-13 DIAGNOSIS — R05 Cough: Secondary | ICD-10-CM

## 2018-08-13 DIAGNOSIS — R059 Cough, unspecified: Secondary | ICD-10-CM

## 2018-08-13 DIAGNOSIS — J988 Other specified respiratory disorders: Secondary | ICD-10-CM | POA: Insufficient documentation

## 2018-08-13 LAB — POC INFLUENZA A&B (BINAX/QUICKVUE)
Influenza A, POC: NEGATIVE
Influenza B, POC: NEGATIVE

## 2018-08-13 MED ORDER — CEFDINIR 300 MG PO CAPS: 300.0000 mg | ORAL_CAPSULE | Freq: Two times a day (BID) | ORAL | 0 refills | Status: DC

## 2018-08-13 MED ORDER — HYDROCODONE-HOMATROPINE 5-1.5 MG/5ML PO SYRP: 5.0000 mL | ORAL_SOLUTION | Freq: Three times a day (TID) | ORAL | 0 refills | Status: AC | PRN

## 2018-08-13 NOTE — Patient Instructions (Signed)
Cough, Adult  Coughing is a reflex that clears your throat and your airways. Coughing helps to heal and protect your lungs. It is normal to cough occasionally, but a cough that happens with other symptoms or lasts a long time may be a sign of a condition that needs treatment. A cough may last only 2-3 weeks (acute), or it may last longer than 8 weeks (chronic). What are the causes? Coughing is commonly caused by:  Breathing in substances that irritate your lungs.  A viral or bacterial respiratory infection.  Allergies.  Asthma.  Postnasal drip.  Smoking.  Acid backing up from the stomach into the esophagus (gastroesophageal reflux).  Certain medicines.  Chronic lung problems, including COPD (or rarely, lung cancer).  Other medical conditions such as heart failure. Follow these instructions at home: Pay attention to any changes in your symptoms. Take these actions to help with your discomfort:  Take medicines only as told by your health care provider. ? If you were prescribed an antibiotic medicine, take it as told by your health care provider. Do not stop taking the antibiotic even if you start to feel better. ? Talk with your health care provider before you take a cough suppressant medicine.  Drink enough fluid to keep your urine clear or pale yellow.  If the air is dry, use a cold steam vaporizer or humidifier in your bedroom or your home to help loosen secretions.  Avoid anything that causes you to cough at work or at home.  If your cough is worse at night, try sleeping in a semi-upright position.  Avoid cigarette smoke. If you smoke, quit smoking. If you need help quitting, ask your health care provider.  Avoid caffeine.  Avoid alcohol.  Rest as needed. Contact a health care provider if:  You have new symptoms.  You cough up pus.  Your cough does not get better after 2-3 weeks, or your cough gets worse.  You cannot control your cough with suppressant  medicines and you are losing sleep.  You develop pain that is getting worse or pain that is not controlled with pain medicines.  You have a fever.  You have unexplained weight loss.  You have night sweats. Get help right away if:  You cough up blood.  You have difficulty breathing.  Your heartbeat is very fast. This information is not intended to replace advice given to you by your health care provider. Make sure you discuss any questions you have with your health care provider. Document Released: 11/18/2010 Document Revised: 10/28/2015 Document Reviewed: 07/29/2014 Elsevier Interactive Patient Education  2019 [employer]  

## 2018-08-13 NOTE — Progress Notes (Signed)
Subjective:  Patient ID: [name] Glass, male    DOB: 02-06-1941  Age: 78 y.o. MRN: [id number]  CC: Cough   HPI [name] presents for a 10-day history of cough productive of thick yellow phlegm with shortness of breath, low-grade fever, chills, sore throat, and fatigue.  Outpatient Medications Prior to Visit  Medication Sig Dispense Refill  . acetaminophen (TYLENOL) 650 MG CR tablet Take 1,300 mg by mouth at bedtime. Reported on 12/16/2015    . acidophilus (RISAQUAD) CAPS capsule Take 1 capsule by mouth 2 (two) times daily.    [name] acyclovir (ZOVIRAX) 200 MG capsule TAKE 1 CAPSULE (200 MG TOTAL) BY MOUTH 3 (THREE) TIMES DAILY. 270 capsule 0  . anastrozole (ARIMIDEX) 1 MG tablet Take 1 mg by mouth 2 (two) times a week.     [name] aspirin EC 81 MG tablet Take 1 tablet (81 mg total) by mouth daily. 90 tablet 3  . BRILINTA 90 MG TABS tablet Take 90 mg by mouth 2 (two) times daily.     [name] DEXILANT 60 MG capsule Take 1 capsule by mouth daily.  1  . ezetimibe (ZETIA) 10 MG tablet TAKE 1 TABLET BY MOUTH EVERY DAY 90 tablet 1  . fluticasone (FLONASE) 50 MCG/ACT nasal spray USE DAILY AS DIRECTED 48 g 1  . liothyronine (CYTOMEL) 25 MCG tablet TAKE 1 TABLET UPON WAKING.  3  . NON FORMULARY Decoanate Injection    . Nutritional Supplements (DHEA PO) Take 25 mg by mouth.    . Pitavastatin Calcium (LIVALO) 4 MG TABS Take 1 tablet (4 mg total) by mouth daily. 90 tablet 1  . Plant Sterols and Stanols (CHOLEST OFF PO) Take 900 mg by mouth.    . potassium gluconate 595 MG TABS Take 595 mg by mouth every evening.    . tamsulosin (FLOMAX) 0.4 MG CAPS capsule TAKE 1 CAPSULE BY MOUTH EVERY DAY IN THE MORNING  5  . testosterone cypionate (DEPOTESTOTERONE CYPIONATE) 100 MG/ML injection Inject into the muscle once a week. For IM use only    . thyroid (ARMOUR) 60 MG tablet Take 60-120 mg by mouth 2 (two) times daily.      No facility-administered medications prior to visit.     ROS Review of Systems    Constitutional: Positive for chills, fatigue and fever. Negative for diaphoresis and unexpected weight change.  HENT: Positive for sore throat. Negative for trouble swallowing and voice change.   Respiratory: Positive for cough and shortness of breath. Negative for chest tightness and wheezing.   Cardiovascular: Negative for chest pain, palpitations and leg swelling.  Gastrointestinal: Negative for abdominal pain, constipation, diarrhea, nausea and vomiting.  Genitourinary: Negative.  Negative for difficulty urinating.  Musculoskeletal: Negative.  Negative for arthralgias and myalgias.  Skin: Negative.  Negative for pallor and rash.  Neurological: Negative for dizziness, weakness and light-headedness.  Hematological: Negative for adenopathy. Does not bruise/bleed easily.  Psychiatric/Behavioral: Negative.     Objective:  BP 140/80 (BP Location: Left Arm, Patient Position: Sitting, Cuff Size: Normal)   Pulse 77   Temp 98.5 F (36.9 C) (Oral)   Ht 5\' 11"  (1.803 m)   Wt 213 lb 8 oz (96.8 kg)   SpO2 96%   BMI 29.78 kg/m   BP Readings from Last 3 Encounters:  08/13/18 140/80  07/05/18 (!) 144/82  07/02/18 136/78    Wt Readings from Last 3 Encounters:  08/13/18 213 lb 8 oz (96.8 kg)  07/05/18 216 lb 3.2 oz (  98.1 kg)  07/02/18 220 lb (99.8 kg)    Physical Exam Vitals signs reviewed.  Constitutional:      General: He is not in acute distress.    Appearance: He is not ill-appearing, toxic-appearing or diaphoretic.  HENT:     Nose: Nose normal. No congestion or rhinorrhea.     Mouth/Throat:     Mouth: Mucous membranes are moist.     Pharynx: Oropharynx is clear. Posterior oropharyngeal erythema present. No oropharyngeal exudate.     Tonsils: No tonsillar exudate or tonsillar abscesses. Swelling: 0 on the right. 0 on the left.  Eyes:     General: No scleral icterus.    Conjunctiva/sclera: Conjunctivae normal.  Neck:     Musculoskeletal: Normal range of motion. No neck  rigidity or muscular tenderness.  Cardiovascular:     Rate and Rhythm: Normal rate and regular rhythm.     Heart sounds: No murmur. No friction rub. No gallop.   Pulmonary:     Breath sounds: Normal breath sounds. No stridor. No wheezing, rhonchi or rales.  Abdominal:     General: Bowel sounds are normal.     Palpations: There is no hepatomegaly, splenomegaly or mass.     Tenderness: There is no abdominal tenderness.  Musculoskeletal: Normal range of motion.        General: No swelling.     Right lower leg: No edema.     Left lower leg: No edema.  Lymphadenopathy:     Cervical: No cervical adenopathy.  Skin:    General: Skin is warm and dry.     Coloration: Skin is not pale.     Findings: No erythema or rash.  Neurological:     General: No focal deficit present.     Mental Status: He is oriented to person, place, and time. Mental status is at baseline.  Psychiatric:        Mood and Affect: Mood normal.        Behavior: Behavior normal.     Lab Results  Component Value Date   WBC 5.2 05/24/2018   HGB 18.3 (A) 05/24/2018   HCT 55 (A) 05/24/2018   PLT 300 07/21/2017   GLUCOSE 102 (H) 06/22/2017   CHOL 189 05/24/2018   TRIG 137 05/24/2018   HDL 48 05/24/2018   LDLCALC 114 05/24/2018   ALT 22 05/24/2018   AST 22 05/24/2018   NA 144 05/24/2018   K 4.9 05/24/2018   CL 103 06/22/2017   CREATININE 1.2 05/24/2018   BUN 13 05/24/2018   CO2 30 06/22/2017   TSH 1.45 05/24/2018   PSA 4.4 05/24/2018   INR 0.96 07/28/2014   HGBA1C 6.1 07/21/2017    Dg Chest 2 View  Result Date: 08/13/2018 CLINICAL DATA:  Cough and congestion for several days EXAM: CHEST - 2 VIEW COMPARISON:  05/18/2017 FINDINGS: Pacing device is now seen in satisfactory position. Cardiac shadow remains mildly enlarged. Aortic calcifications are seen. The lungs are well aerated bilaterally. No focal infiltrate or sizable effusion is seen. Degenerative changes of the thoracic spine are noted. IMPRESSION: No  acute abnormality noted. Electronically Signed   By: Inez Catalina M.D.   On: 08/13/2018 11:17    Assessment & Plan:   [name] was seen today for cough.  Diagnoses and all orders for this visit:  Cough- His chest x-ray is negative for infiltrate. -     DG Chest 2 View; Future -     HYDROcodone-homatropine (HYCODAN) 5-1.5 MG/5ML syrup;  Take 5 mLs by mouth every 8 (eight) hours as needed for up to 6 days for cough.  Flu-like symptoms- Screening for influenza A and B is negative. -     POC Influenza A&B (Binax test) -     HYDROcodone-homatropine (HYCODAN) 5-1.5 MG/5ML syrup; Take 5 mLs by mouth every 8 (eight) hours as needed for up to 6 days for cough.  RTI (respiratory tract infection)- I will treat the infection with Omnicef and will control the cough with Hycodan as needed. -     HYDROcodone-homatropine (HYCODAN) 5-1.5 MG/5ML syrup; Take 5 mLs by mouth every 8 (eight) hours as needed for up to 6 days for cough. -     cefdinir (OMNICEF) 300 MG capsule; Take 1 capsule (300 mg total) by mouth 2 (two) times daily for 10 days.   I am having [name] start on HYDROcodone-homatropine and cefdinir. I am also having him maintain his potassium gluconate, thyroid, acidophilus, acetaminophen, testosterone cypionate, Dexilant, Plant Sterols and Stanols (CHOLEST OFF PO), Nutritional Supplements (DHEA PO), liothyronine, anastrozole, NON FORMULARY, aspirin EC, tamsulosin, fluticasone, ezetimibe, acyclovir, Brilinta, and Pitavastatin Calcium.  Meds ordered this encounter  Medications  . HYDROcodone-homatropine (HYCODAN) 5-1.5 MG/5ML syrup    Sig: Take 5 mLs by mouth every 8 (eight) hours as needed for up to 6 days for cough.    Dispense:  120 mL    Refill:  0  . cefdinir (OMNICEF) 300 MG capsule    Sig: Take 1 capsule (300 mg total) by mouth 2 (two) times daily for 10 days.    Dispense:  20 capsule    Refill:  0     Follow-up: Return if symptoms worsen or fail to improve.  Scarlette Calico,  MD

## 2018-08-20 ENCOUNTER — Telehealth: Payer: Self-pay | Admitting: Cardiology

## 2018-08-20 NOTE — Telephone Encounter (Signed)
Patient called w/ questions about his home monitor. Pt is concerned that his monitor is not working b/c the date doesn't change. I informed pt of how the monitor works and that the date won't change. Informed him how to send a manual transmission and when we see reports. And that there was no disconnected monitor note on his carelink profile. Pt verbalized understanding.

## 2018-08-28 ENCOUNTER — Ambulatory Visit: Payer: Self-pay | Admitting: Internal Medicine

## 2018-08-28 ENCOUNTER — Ambulatory Visit: Payer: Medicare Other | Admitting: Internal Medicine

## 2018-08-28 NOTE — Telephone Encounter (Signed)
Pt called in saying he had been seen 2 wks ago for these same symptoms but he is not feeling any better in fact he feels worse.   Still has a bad cough with yellow-greenish mucus.   C/o bad sore throat and shortness of breath that is worse than it was when seen 2 wks ago.  I made him an appt for today at 2:30 with Dr. Ronnald Ramp.   I let him know  Someone from the office would be calling him about possibly doing a WebEx visit.   He was agreeable to this plan.  Reason for Disposition . [1] SEVERE sore throat AND [2] present > 24 hours  Answer Assessment - Initial Assessment Questions 1. ONSET: "When did the nasal discharge start?"      You saw Dr. Ronnald Ramp 2 weeks ago yesterday.    It was a sinus infection and I was coughing up stuff.    Nose not running.   I'm coughing up a lot of stuff. 2. AMOUNT: "How much discharge is there?"      *No Answer* 3. COUGH: "Do you have a cough?" If yes, ask: "Describe the color of your sputum" (clear, white, yellow, green)     Yes.  Greenish - yellow mucus.  4. RESPIRATORY DISTRESS: "Describe your breathing."      I'm short of breath.    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 FEVER: "Do you have a fever?" If so, ask: "What is your temperature, how was it measured, and when did it start?"     No.   Never had fever.   98.3 this morning. 6. SEVERITY: "Overall, how bad are you feeling right now?" (e.g., doesn't interfere with normal activities, staying home from school/work, staying in bed)      I feel terrible and the shortness of breath is  7. OTHER SYMPTOMS: "Do you have any other symptoms?" (e.g., sore throat, earache, wheezing, vomiting)     Sore throat, earache right,   8. PREGNANCY: "Is there any chance you are pregnant?" "When was your last menstrual period?"     N/A  Protocols used: COMMON COLD-A-AH

## 2018-08-29 ENCOUNTER — Other Ambulatory Visit: Payer: Self-pay | Admitting: Internal Medicine

## 2018-09-02 ENCOUNTER — Other Ambulatory Visit: Payer: Self-pay | Admitting: Internal Medicine

## 2018-09-02 ENCOUNTER — Telehealth: Payer: Self-pay | Admitting: Internal Medicine

## 2018-09-02 DIAGNOSIS — J988 Other specified respiratory disorders: Secondary | ICD-10-CM

## 2018-09-02 NOTE — Telephone Encounter (Signed)
Copied from [place] [id number]. Topic: Quick Communication - Rx Refill/Question >> Sep 02, 2018 11:26 AM Sheran Luz wrote: Medication:cefdinir (OMNICEF) 300 MG capsule [[id number]]  Patient is requesting a refill of this medication. Patient states he is having same symptoms as before when this was prescribed. Cough and sinus drainage. He is requesting a refill without OV, if possible.   Preferred Pharmacy (with phone number or street name):  [id number] - [place], [place] - [address] [phone] (Phone) [phone] (Fax)

## 2018-09-02 NOTE — Telephone Encounter (Signed)
Can he do a virtual visit. We saw him almost a month ago.

## 2018-09-04 DIAGNOSIS — E785 Hyperlipidemia, unspecified: Secondary | ICD-10-CM | POA: Diagnosis not present

## 2018-09-04 NOTE — Telephone Encounter (Signed)
Patient going to continue taking medication

## 2018-09-05 LAB — LIPID PANEL
CHOL/HDL RATIO: 4.1 ratio (ref 0.0–5.0)
Cholesterol, Total: 156 mg/dL (ref 100–199)
HDL: 38 mg/dL — ABNORMAL LOW (ref >39)
LDL CALC: 82 mg/dL (ref 0–99)
Triglycerides: 179 mg/dL — ABNORMAL HIGH (ref 0–149)
VLDL Cholesterol Cal: 36 mg/dL (ref 5–40)

## 2018-09-05 LAB — HEPATIC FUNCTION PANEL
ALT: 21 IU/L (ref 0–44)
AST: 22 IU/L (ref 0–40)
Albumin: 4.3 g/dL (ref 3.7–4.7)
Alkaline Phosphatase: 66 IU/L (ref 39–117)
Bilirubin Total: 0.5 mg/dL (ref 0.0–1.2)
Bilirubin, Direct: 0.13 mg/dL (ref 0.00–0.40)
Total Protein: 7.2 g/dL (ref 6.0–8.5)

## 2018-09-25 ENCOUNTER — Telehealth: Payer: Self-pay

## 2018-09-25 NOTE — Telephone Encounter (Signed)
Key: [id number]   PA has been approved.   Can you call pt and inform of same?

## 2018-09-25 NOTE — Telephone Encounter (Signed)
Pt informed and expressed understanding. 

## 2018-10-04 ENCOUNTER — Telehealth: Payer: Self-pay

## 2018-10-04 NOTE — Telephone Encounter (Signed)
I called and spoke with patient about his appointment with Dr. Caryl Comes next week. He said normally he would be ok with a video visit, but he feels like he needs to be seen. His appointment is a 55 day follow up after pacemaker was implanted. Patient states that one side looks good, but the other side is sticking up and it is red and very sore. Patient states that he can not touch the site without it being painful. Please call patient to discuss.

## 2018-10-07 ENCOUNTER — Ambulatory Visit (INDEPENDENT_AMBULATORY_CARE_PROVIDER_SITE_OTHER): Payer: Medicare Other | Admitting: *Deleted

## 2018-10-07 ENCOUNTER — Other Ambulatory Visit: Payer: Self-pay

## 2018-10-07 DIAGNOSIS — R55 Syncope and collapse: Secondary | ICD-10-CM | POA: Diagnosis not present

## 2018-10-07 DIAGNOSIS — Z950 Presence of cardiac pacemaker: Secondary | ICD-10-CM

## 2018-10-07 NOTE — Telephone Encounter (Signed)
Spoke with patient. Medial to the incision, he has a hard area that is red and painful to the touch.  Will bring in Wednesday 5/6 for device clinic visit. Dr Lovena Le is in the office that day.  Dr Caryl Comes is not currently seeing patients in office.  Chanetta Marshall, NP 10/07/2018 11:18 AM

## 2018-10-08 ENCOUNTER — Telehealth: Payer: Self-pay

## 2018-10-08 LAB — CUP PACEART REMOTE DEVICE CHECK
Battery Remaining Longevity: 135 mo
Battery Voltage: 3.13 V
Brady Statistic AP VP Percent: 1.81 %
Brady Statistic AP VS Percent: 66.74 %
Brady Statistic AS VP Percent: 0.19 %
Brady Statistic AS VS Percent: 31.26 %
Brady Statistic RA Percent Paced: 68.57 %
Brady Statistic RV Percent Paced: 2 %
Date Time Interrogation Session: 20200505135217
Implantable Lead Implant Date: 20200118
Implantable Lead Implant Date: 20200118
Implantable Lead Location: 753859
Implantable Lead Location: 753860
Implantable Lead Model: 5076
Implantable Lead Model: 5076
Implantable Pulse Generator Implant Date: 20200118
Lead Channel Impedance Value: 361 Ohm
Lead Channel Impedance Value: 380 Ohm
Lead Channel Impedance Value: 475 Ohm
Lead Channel Impedance Value: 532 Ohm
Lead Channel Pacing Threshold Amplitude: 0.625 V
Lead Channel Pacing Threshold Amplitude: 0.625 V
Lead Channel Pacing Threshold Pulse Width: 0.4 ms
Lead Channel Pacing Threshold Pulse Width: 0.4 ms
Lead Channel Sensing Intrinsic Amplitude: 4.5 mV
Lead Channel Sensing Intrinsic Amplitude: 4.5 mV
Lead Channel Sensing Intrinsic Amplitude: 7 mV
Lead Channel Sensing Intrinsic Amplitude: 7 mV
Lead Channel Setting Pacing Amplitude: 2.75 V
Lead Channel Setting Pacing Amplitude: 2.75 V
Lead Channel Setting Pacing Pulse Width: 0.4 ms
Lead Channel Setting Sensing Sensitivity: 1.2 mV

## 2018-10-08 NOTE — Telephone Encounter (Signed)
Spoke with patient to remind of missed remote transmission 

## 2018-10-08 NOTE — Telephone Encounter (Signed)
Noted  

## 2018-10-09 ENCOUNTER — Other Ambulatory Visit: Payer: Self-pay

## 2018-10-09 ENCOUNTER — Ambulatory Visit (INDEPENDENT_AMBULATORY_CARE_PROVIDER_SITE_OTHER): Payer: Medicare Other | Admitting: *Deleted

## 2018-10-09 DIAGNOSIS — I495 Sick sinus syndrome: Secondary | ICD-10-CM | POA: Diagnosis not present

## 2018-10-09 LAB — CUP PACEART INCLINIC DEVICE CHECK
Battery Remaining Longevity: 153 mo
Battery Voltage: 3.13 V
Brady Statistic AP VP Percent: 1.49 %
Brady Statistic AP VS Percent: 64.2 %
Brady Statistic AS VP Percent: 0.21 %
Brady Statistic AS VS Percent: 34.1 %
Brady Statistic RA Percent Paced: 65.71 %
Brady Statistic RV Percent Paced: 1.71 %
Date Time Interrogation Session: 20200506134524
Implantable Lead Implant Date: 20200118
Implantable Lead Implant Date: 20200118
Implantable Lead Location: 753859
Implantable Lead Location: 753860
Implantable Lead Model: 5076
Implantable Lead Model: 5076
Implantable Pulse Generator Implant Date: 20200118
Lead Channel Impedance Value: 361 Ohm
Lead Channel Impedance Value: 361 Ohm
Lead Channel Impedance Value: 399 Ohm
Lead Channel Impedance Value: 399 Ohm
Lead Channel Impedance Value: 494 Ohm
Lead Channel Impedance Value: 494 Ohm
Lead Channel Impedance Value: 551 Ohm
Lead Channel Impedance Value: 551 Ohm
Lead Channel Pacing Threshold Amplitude: 0.625 V
Lead Channel Pacing Threshold Amplitude: 0.625 V
Lead Channel Pacing Threshold Amplitude: 0.625 V
Lead Channel Pacing Threshold Amplitude: 0.625 V
Lead Channel Pacing Threshold Pulse Width: 0.4 ms
Lead Channel Pacing Threshold Pulse Width: 0.4 ms
Lead Channel Pacing Threshold Pulse Width: 0.4 ms
Lead Channel Pacing Threshold Pulse Width: 0.4 ms
Lead Channel Sensing Intrinsic Amplitude: 3.125 mV
Lead Channel Sensing Intrinsic Amplitude: 3.125 mV
Lead Channel Sensing Intrinsic Amplitude: 3.75 mV
Lead Channel Sensing Intrinsic Amplitude: 3.75 mV
Lead Channel Sensing Intrinsic Amplitude: 4.25 mV
Lead Channel Sensing Intrinsic Amplitude: 4.25 mV
Lead Channel Sensing Intrinsic Amplitude: 7 mV
Lead Channel Sensing Intrinsic Amplitude: 7 mV
Lead Channel Setting Pacing Amplitude: 2 V
Lead Channel Setting Pacing Amplitude: 2.5 V
Lead Channel Setting Pacing Pulse Width: 0.4 ms
Lead Channel Setting Sensing Sensitivity: 1.2 mV

## 2018-10-09 NOTE — Progress Notes (Signed)
Pacemaker check in clinic. Normal device function. Thresholds, sensing, impedances consistent with previous measurements. Device programmed to maximize longevity. No mode switch, 2 short  high ventricular rates noted. Device programmed at appropriate safety margins. Histogram distribution appropriate for patient activity level. Device programmed to optimize intrinsic conduction. Estimated longevity 12 yrs 9 months. C/O tenderness at pacemaker site, site red, tender to touch, pain with ROM. No drainage at site. no fever or chills. Dr Lovena Le examined pt today and pt advised to call office with any fever of chills, or drainage from site.  Plan to follow up in office in 4 weeks with Dr Caryl Comes. Patient education completed.

## 2018-10-09 NOTE — Patient Instructions (Signed)
Call with fever, chills, and drainage from pacemaker site.

## 2018-10-10 ENCOUNTER — Telehealth: Payer: Medicare Other | Admitting: Internal Medicine

## 2018-10-14 ENCOUNTER — Encounter: Payer: Self-pay | Admitting: Cardiology

## 2018-10-14 NOTE — Progress Notes (Signed)
Remote pacemaker transmission.   

## 2018-10-21 ENCOUNTER — Encounter: Payer: Self-pay | Admitting: Internal Medicine

## 2018-10-22 ENCOUNTER — Ambulatory Visit: Payer: Self-pay | Admitting: *Deleted

## 2018-10-22 ENCOUNTER — Emergency Department (HOSPITAL_COMMUNITY): Payer: Medicare Other

## 2018-10-22 ENCOUNTER — Telehealth: Payer: Self-pay | Admitting: Cardiovascular Disease

## 2018-10-22 ENCOUNTER — Inpatient Hospital Stay (HOSPITAL_COMMUNITY)
Admission: EM | Admit: 2018-10-22 | Discharge: 2018-10-31 | DRG: 242 | Disposition: A | Discharge status: Home Health | Payer: Medicare Other | Attending: Internal Medicine | Admitting: Internal Medicine

## 2018-10-22 ENCOUNTER — Other Ambulatory Visit: Payer: Self-pay

## 2018-10-22 ENCOUNTER — Encounter (HOSPITAL_COMMUNITY): Payer: Self-pay | Admitting: Emergency Medicine

## 2018-10-22 DIAGNOSIS — J439 Emphysema, unspecified: Secondary | ICD-10-CM | POA: Diagnosis not present

## 2018-10-22 DIAGNOSIS — Z888 Allergy status to other drugs, medicaments and biological substances status: Secondary | ICD-10-CM

## 2018-10-22 DIAGNOSIS — M1710 Unilateral primary osteoarthritis, unspecified knee: Secondary | ICD-10-CM | POA: Diagnosis present

## 2018-10-22 DIAGNOSIS — R0902 Hypoxemia: Secondary | ICD-10-CM | POA: Diagnosis not present

## 2018-10-22 DIAGNOSIS — R509 Fever, unspecified: Secondary | ICD-10-CM

## 2018-10-22 DIAGNOSIS — Z833 Family history of diabetes mellitus: Secondary | ICD-10-CM

## 2018-10-22 DIAGNOSIS — J209 Acute bronchitis, unspecified: Secondary | ICD-10-CM | POA: Diagnosis present

## 2018-10-22 DIAGNOSIS — Z881 Allergy status to other antibiotic agents status: Secondary | ICD-10-CM

## 2018-10-22 DIAGNOSIS — N179 Acute kidney failure, unspecified: Secondary | ICD-10-CM | POA: Diagnosis present

## 2018-10-22 DIAGNOSIS — Z96651 Presence of right artificial knee joint: Secondary | ICD-10-CM | POA: Diagnosis present

## 2018-10-22 DIAGNOSIS — Z801 Family history of malignant neoplasm of trachea, bronchus and lung: Secondary | ICD-10-CM

## 2018-10-22 DIAGNOSIS — M179 Osteoarthritis of knee, unspecified: Secondary | ICD-10-CM | POA: Diagnosis present

## 2018-10-22 DIAGNOSIS — M545 Low back pain, unspecified: Secondary | ICD-10-CM | POA: Diagnosis present

## 2018-10-22 DIAGNOSIS — I10 Essential (primary) hypertension: Secondary | ICD-10-CM | POA: Diagnosis not present

## 2018-10-22 DIAGNOSIS — Z7982 Long term (current) use of aspirin: Secondary | ICD-10-CM

## 2018-10-22 DIAGNOSIS — I440 Atrioventricular block, first degree: Secondary | ICD-10-CM | POA: Diagnosis not present

## 2018-10-22 DIAGNOSIS — Z9081 Acquired absence of spleen: Secondary | ICD-10-CM | POA: Diagnosis not present

## 2018-10-22 DIAGNOSIS — E039 Hypothyroidism, unspecified: Secondary | ICD-10-CM | POA: Diagnosis present

## 2018-10-22 DIAGNOSIS — Z20828 Contact with and (suspected) exposure to other viral communicable diseases: Secondary | ICD-10-CM | POA: Diagnosis present

## 2018-10-22 DIAGNOSIS — N183 Chronic kidney disease, stage 3 unspecified: Secondary | ICD-10-CM | POA: Diagnosis present

## 2018-10-22 DIAGNOSIS — A409 Streptococcal sepsis, unspecified: Secondary | ICD-10-CM | POA: Diagnosis present

## 2018-10-22 DIAGNOSIS — Z803 Family history of malignant neoplasm of breast: Secondary | ICD-10-CM

## 2018-10-22 DIAGNOSIS — Z8249 Family history of ischemic heart disease and other diseases of the circulatory system: Secondary | ICD-10-CM

## 2018-10-22 DIAGNOSIS — R300 Dysuria: Secondary | ICD-10-CM | POA: Diagnosis not present

## 2018-10-22 DIAGNOSIS — I129 Hypertensive chronic kidney disease with stage 1 through stage 4 chronic kidney disease, or unspecified chronic kidney disease: Secondary | ICD-10-CM | POA: Diagnosis present

## 2018-10-22 DIAGNOSIS — I248 Other forms of acute ischemic heart disease: Secondary | ICD-10-CM | POA: Diagnosis present

## 2018-10-22 DIAGNOSIS — A419 Sepsis, unspecified organism: Secondary | ICD-10-CM

## 2018-10-22 DIAGNOSIS — K219 Gastro-esophageal reflux disease without esophagitis: Secondary | ICD-10-CM | POA: Diagnosis present

## 2018-10-22 DIAGNOSIS — Z9079 Acquired absence of other genital organ(s): Secondary | ICD-10-CM

## 2018-10-22 DIAGNOSIS — I340 Nonrheumatic mitral (valve) insufficiency: Secondary | ICD-10-CM | POA: Diagnosis not present

## 2018-10-22 DIAGNOSIS — Z885 Allergy status to narcotic agent status: Secondary | ICD-10-CM | POA: Diagnosis not present

## 2018-10-22 DIAGNOSIS — N401 Enlarged prostate with lower urinary tract symptoms: Secondary | ICD-10-CM | POA: Diagnosis present

## 2018-10-22 DIAGNOSIS — T827XXA Infection and inflammatory reaction due to other cardiac and vascular devices, implants and grafts, initial encounter: Secondary | ICD-10-CM | POA: Diagnosis not present

## 2018-10-22 DIAGNOSIS — Z8719 Personal history of other diseases of the digestive system: Secondary | ICD-10-CM

## 2018-10-22 DIAGNOSIS — E785 Hyperlipidemia, unspecified: Secondary | ICD-10-CM | POA: Diagnosis present

## 2018-10-22 DIAGNOSIS — I213 ST elevation (STEMI) myocardial infarction of unspecified site: Secondary | ICD-10-CM | POA: Diagnosis not present

## 2018-10-22 DIAGNOSIS — I2510 Atherosclerotic heart disease of native coronary artery without angina pectoris: Secondary | ICD-10-CM | POA: Diagnosis not present

## 2018-10-22 DIAGNOSIS — R7881 Bacteremia: Secondary | ICD-10-CM

## 2018-10-22 DIAGNOSIS — Z79899 Other long term (current) drug therapy: Secondary | ICD-10-CM

## 2018-10-22 DIAGNOSIS — R6520 Severe sepsis without septic shock: Secondary | ICD-10-CM | POA: Diagnosis present

## 2018-10-22 DIAGNOSIS — G4730 Sleep apnea, unspecified: Secondary | ICD-10-CM | POA: Diagnosis present

## 2018-10-22 DIAGNOSIS — R0600 Dyspnea, unspecified: Secondary | ICD-10-CM | POA: Diagnosis not present

## 2018-10-22 DIAGNOSIS — K449 Diaphragmatic hernia without obstruction or gangrene: Secondary | ICD-10-CM | POA: Diagnosis present

## 2018-10-22 DIAGNOSIS — R0689 Other abnormalities of breathing: Secondary | ICD-10-CM | POA: Diagnosis not present

## 2018-10-22 DIAGNOSIS — Y831 Surgical operation with implant of artificial internal device as the cause of abnormal reaction of the patient, or of later complication, without mention of misadventure at the time of the procedure: Secondary | ICD-10-CM | POA: Diagnosis present

## 2018-10-22 DIAGNOSIS — Q211 Atrial septal defect: Secondary | ICD-10-CM

## 2018-10-22 DIAGNOSIS — R350 Frequency of micturition: Secondary | ICD-10-CM | POA: Diagnosis not present

## 2018-10-22 DIAGNOSIS — F1010 Alcohol abuse, uncomplicated: Secondary | ICD-10-CM | POA: Diagnosis present

## 2018-10-22 DIAGNOSIS — B955 Unspecified streptococcus as the cause of diseases classified elsewhere: Secondary | ICD-10-CM | POA: Diagnosis not present

## 2018-10-22 DIAGNOSIS — I351 Nonrheumatic aortic (valve) insufficiency: Secondary | ICD-10-CM | POA: Diagnosis not present

## 2018-10-22 DIAGNOSIS — M1990 Unspecified osteoarthritis, unspecified site: Secondary | ICD-10-CM | POA: Diagnosis present

## 2018-10-22 DIAGNOSIS — M48061 Spinal stenosis, lumbar region without neurogenic claudication: Secondary | ICD-10-CM | POA: Diagnosis not present

## 2018-10-22 DIAGNOSIS — I700 Atherosclerosis of aorta: Secondary | ICD-10-CM | POA: Diagnosis present

## 2018-10-22 DIAGNOSIS — J9601 Acute respiratory failure with hypoxia: Secondary | ICD-10-CM | POA: Diagnosis present

## 2018-10-22 DIAGNOSIS — Z87442 Personal history of urinary calculi: Secondary | ICD-10-CM

## 2018-10-22 DIAGNOSIS — I4510 Unspecified right bundle-branch block: Secondary | ICD-10-CM | POA: Diagnosis not present

## 2018-10-22 DIAGNOSIS — R0602 Shortness of breath: Secondary | ICD-10-CM | POA: Diagnosis not present

## 2018-10-22 DIAGNOSIS — J42 Unspecified chronic bronchitis: Secondary | ICD-10-CM | POA: Diagnosis present

## 2018-10-22 DIAGNOSIS — I083 Combined rheumatic disorders of mitral, aortic and tricuspid valves: Secondary | ICD-10-CM | POA: Diagnosis not present

## 2018-10-22 DIAGNOSIS — B954 Other streptococcus as the cause of diseases classified elsewhere: Secondary | ICD-10-CM | POA: Diagnosis not present

## 2018-10-22 DIAGNOSIS — Z820 Family history of epilepsy and other diseases of the nervous system: Secondary | ICD-10-CM

## 2018-10-22 DIAGNOSIS — I495 Sick sinus syndrome: Secondary | ICD-10-CM | POA: Diagnosis present

## 2018-10-22 DIAGNOSIS — I252 Old myocardial infarction: Secondary | ICD-10-CM

## 2018-10-22 DIAGNOSIS — Z955 Presence of coronary angioplasty implant and graft: Secondary | ICD-10-CM

## 2018-10-22 DIAGNOSIS — N182 Chronic kidney disease, stage 2 (mild): Secondary | ICD-10-CM | POA: Diagnosis present

## 2018-10-22 DIAGNOSIS — Z7951 Long term (current) use of inhaled steroids: Secondary | ICD-10-CM

## 2018-10-22 DIAGNOSIS — I471 Supraventricular tachycardia: Secondary | ICD-10-CM | POA: Diagnosis not present

## 2018-10-22 DIAGNOSIS — Z950 Presence of cardiac pacemaker: Secondary | ICD-10-CM | POA: Diagnosis not present

## 2018-10-22 DIAGNOSIS — J984 Other disorders of lung: Secondary | ICD-10-CM | POA: Diagnosis not present

## 2018-10-22 DIAGNOSIS — R011 Cardiac murmur, unspecified: Secondary | ICD-10-CM | POA: Diagnosis not present

## 2018-10-22 DIAGNOSIS — R7989 Other specified abnormal findings of blood chemistry: Secondary | ICD-10-CM | POA: Diagnosis not present

## 2018-10-22 DIAGNOSIS — Z823 Family history of stroke: Secondary | ICD-10-CM

## 2018-10-22 DIAGNOSIS — Z95818 Presence of other cardiac implants and grafts: Secondary | ICD-10-CM

## 2018-10-22 DIAGNOSIS — R0789 Other chest pain: Secondary | ICD-10-CM | POA: Diagnosis not present

## 2018-10-22 LAB — COMPREHENSIVE METABOLIC PANEL
ALT: 25 U/L (ref 0–44)
AST: 43 U/L — ABNORMAL HIGH (ref 15–41)
Albumin: 3.7 g/dL (ref 3.5–5.0)
Alkaline Phosphatase: 66 U/L (ref 38–126)
Anion gap: 16 — ABNORMAL HIGH (ref 5–15)
BUN: 13 mg/dL (ref 8–23)
CO2: 17 mmol/L — ABNORMAL LOW (ref 22–32)
Calcium: 10.3 mg/dL (ref 8.9–10.3)
Chloride: 108 mmol/L (ref 98–111)
Creatinine, Ser: 1.24 mg/dL (ref 0.61–1.24)
GFR calc Af Amer: >60 mL/min (ref >60)
GFR calc non Af Amer: 55 mL/min — ABNORMAL LOW (ref >60)
Glucose, Bld: 162 mg/dL — ABNORMAL HIGH (ref 70–99)
Potassium: 4.4 mmol/L (ref 3.5–5.1)
Sodium: 141 mmol/L (ref 135–145)
Total Bilirubin: 1.8 mg/dL — ABNORMAL HIGH (ref 0.3–1.2)
Total Protein: 7.1 g/dL (ref 6.5–8.1)

## 2018-10-22 LAB — URINALYSIS, ROUTINE W REFLEX MICROSCOPIC
Bilirubin Urine: NEGATIVE
Glucose, UA: NEGATIVE mg/dL
Ketones, ur: NEGATIVE mg/dL
Leukocytes,Ua: NEGATIVE
Nitrite: NEGATIVE
Protein, ur: 100 mg/dL — AB
Specific Gravity, Urine: 1.01 (ref 1.005–1.030)
pH: 5 (ref 5.0–8.0)

## 2018-10-22 LAB — BLOOD CULTURE ID PANEL (REFLEXED)

## 2018-10-22 LAB — CBC WITH DIFFERENTIAL/PLATELET
Abs Immature Granulocytes: 0.02 10*3/uL (ref 0.00–0.07)
Basophils Absolute: 0 10*3/uL (ref 0.0–0.1)
Basophils Relative: 0 %
Eosinophils Absolute: 0 10*3/uL (ref 0.0–0.5)
Eosinophils Relative: 1 %
HCT: 54.3 % — ABNORMAL HIGH (ref 39.0–52.0)
Hemoglobin: 18.2 g/dL — ABNORMAL HIGH (ref 13.0–17.0)
Immature Granulocytes: 0 %
Lymphocytes Relative: 22 %
Lymphs Abs: 1 10*3/uL (ref 0.7–4.0)
MCH: 31.2 pg (ref 26.0–34.0)
MCHC: 33.5 g/dL (ref 30.0–36.0)
MCV: 93.1 fL (ref 80.0–100.0)
Monocytes Absolute: 0 10*3/uL — ABNORMAL LOW (ref 0.1–1.0)
Monocytes Relative: 1 %
Neutro Abs: 3.7 10*3/uL (ref 1.7–7.7)
Neutrophils Relative %: 76 %
Platelets: 261 10*3/uL (ref 150–400)
RBC: 5.83 MIL/uL — ABNORMAL HIGH (ref 4.22–5.81)
RDW: 19 % — ABNORMAL HIGH (ref 11.5–15.5)
WBC: 5 10*3/uL (ref 4.0–10.5)
nRBC: 0 % (ref 0.0–0.2)

## 2018-10-22 LAB — RESPIRATORY PANEL BY PCR

## 2018-10-22 LAB — LACTIC ACID, PLASMA
Lactic Acid, Venous: 4.3 mmol/L (ref 0.5–1.9)
Lactic Acid, Venous: 3.1 mmol/L (ref 0.5–1.9)

## 2018-10-22 LAB — CBC
HCT: 49.1 % (ref 39.0–52.0)
Hemoglobin: 16.8 g/dL (ref 13.0–17.0)
MCH: 31.5 pg (ref 26.0–34.0)
MCHC: 34.2 g/dL (ref 30.0–36.0)
MCV: 91.9 fL (ref 80.0–100.0)
Platelets: 199 10*3/uL (ref 150–400)
RBC: 5.34 MIL/uL (ref 4.22–5.81)
RDW: 18.9 % — ABNORMAL HIGH (ref 11.5–15.5)
WBC: 18.5 10*3/uL — ABNORMAL HIGH (ref 4.0–10.5)
nRBC: 0.2 % (ref 0.0–0.2)

## 2018-10-22 LAB — INFLUENZA PANEL BY PCR (TYPE A & B)
Influenza A By PCR: NEGATIVE
Influenza B By PCR: NEGATIVE

## 2018-10-22 LAB — TROPONIN I
Troponin I: 0.07 ng/mL (ref <0.03)
Troponin I: 0.26 ng/mL (ref <0.03)

## 2018-10-22 LAB — BRAIN NATRIURETIC PEPTIDE: B Natriuretic Peptide: 60.5 pg/mL (ref 0.0–100.0)

## 2018-10-22 LAB — SARS CORONAVIRUS 2 BY RT PCR (HOSPITAL ORDER, PERFORMED IN ~~LOC~~ HOSPITAL LAB)
SARS Coronavirus 2: NEGATIVE
SARS Coronavirus 2: NEGATIVE

## 2018-10-22 LAB — CREATININE, SERUM
Creatinine, Ser: 1.97 mg/dL — ABNORMAL HIGH (ref 0.61–1.24)
GFR calc Af Amer: 37 mL/min — ABNORMAL LOW (ref >60)
GFR calc non Af Amer: 32 mL/min — ABNORMAL LOW (ref >60)

## 2018-10-22 MED ORDER — THYROID 60 MG PO TABS: 60.0000 mg | ORAL_TABLET | Freq: Two times a day (BID) | ORAL | Status: DC

## 2018-10-22 MED ORDER — PRAVASTATIN SODIUM 40 MG PO TABS
40.0000 mg | ORAL_TABLET | Freq: Every day | ORAL | Status: DC
  Administered 2018-10-23 – 2018-10-30 (×8): 40 mg via ORAL
  Filled 2018-10-22 (×8): qty 1

## 2018-10-22 MED ORDER — SODIUM CHLORIDE 0.9 % IV BOLUS
1000.0000 mL | Freq: Once | INTRAVENOUS | Status: AC
  Administered 2018-10-22: 1000 mL via INTRAVENOUS

## 2018-10-22 MED ORDER — PANTOPRAZOLE SODIUM 40 MG PO TBEC
40.0000 mg | DELAYED_RELEASE_TABLET | Freq: Every day | ORAL | Status: DC
  Administered 2018-10-23 – 2018-10-31 (×9): 40 mg via ORAL
  Filled 2018-10-22 (×9): qty 1

## 2018-10-22 MED ORDER — FLUTICASONE PROPIONATE 50 MCG/ACT NA SUSP
2.0000 | Freq: Every day | NASAL | Status: DC
  Administered 2018-10-23 – 2018-10-31 (×9): 2 via NASAL
  Filled 2018-10-22: qty 16

## 2018-10-22 MED ORDER — SODIUM CHLORIDE 0.9 % IV SOLN: INTRAVENOUS | Status: DC | PRN

## 2018-10-22 MED ORDER — TICAGRELOR 90 MG PO TABS
90.0000 mg | ORAL_TABLET | Freq: Two times a day (BID) | ORAL | Status: DC
  Administered 2018-10-23 (×3): 90 mg via ORAL
  Filled 2018-10-22 (×3): qty 1

## 2018-10-22 MED ORDER — VANCOMYCIN HCL IN DEXTROSE 1-5 GM/200ML-% IV SOLN
1000.0000 mg | Freq: Once | INTRAVENOUS | Status: AC
  Administered 2018-10-22: 1000 mg via INTRAVENOUS
  Filled 2018-10-22: qty 200

## 2018-10-22 MED ORDER — SODIUM CHLORIDE 0.9 % IV SOLN
2.0000 g | Freq: Two times a day (BID) | INTRAVENOUS | Status: DC
  Administered 2018-10-23: 2 g via INTRAVENOUS
  Filled 2018-10-22 (×2): qty 2

## 2018-10-22 MED ORDER — ANASTROZOLE 1 MG PO TABS
1.0000 mg | ORAL_TABLET | ORAL | Status: DC
  Administered 2018-10-24 – 2018-10-31 (×3): 1 mg via ORAL
  Filled 2018-10-22 (×3): qty 1

## 2018-10-22 MED ORDER — ENOXAPARIN SODIUM 40 MG/0.4ML ~~LOC~~ SOLN
40.0000 mg | SUBCUTANEOUS | Status: DC
  Administered 2018-10-23 – 2018-10-24 (×2): 40 mg via SUBCUTANEOUS
  Filled 2018-10-22 (×2): qty 0.4

## 2018-10-22 MED ORDER — IPRATROPIUM-ALBUTEROL 0.5-2.5 (3) MG/3ML IN SOLN
3.0000 mL | Freq: Four times a day (QID) | RESPIRATORY_TRACT | Status: DC
  Administered 2018-10-23: 3 mL via RESPIRATORY_TRACT
  Filled 2018-10-22 (×3): qty 3

## 2018-10-22 MED ORDER — SODIUM CHLORIDE 0.9 % IV SOLN
2.0000 g | Freq: Once | INTRAVENOUS | Status: AC
  Administered 2018-10-22: 2 g via INTRAVENOUS
  Filled 2018-10-22: qty 2

## 2018-10-22 MED ORDER — THYROID 60 MG PO TABS
60.0000 mg | ORAL_TABLET | Freq: Every evening | ORAL | Status: DC
  Administered 2018-10-23 – 2018-10-30 (×9): 60 mg via ORAL
  Filled 2018-10-22 (×10): qty 1

## 2018-10-22 MED ORDER — SODIUM CHLORIDE 0.9 % IV SOLN: 1.0000 g | Freq: Two times a day (BID) | INTRAVENOUS | Status: DC

## 2018-10-22 MED ORDER — SODIUM CHLORIDE 0.9 % IV BOLUS
1000.0000 mL | Freq: Once | INTRAVENOUS | Status: AC
  Administered 2018-10-22: 18:00:00 1000 mL via INTRAVENOUS

## 2018-10-22 MED ORDER — IOHEXOL 350 MG/ML SOLN
100.0000 mL | Freq: Once | INTRAVENOUS | Status: AC | PRN
  Administered 2018-10-22: 100 mL via INTRAVENOUS

## 2018-10-22 MED ORDER — PROPOFOL 1000 MG/100ML IV EMUL: 5.0000 ug/kg/min | INTRAVENOUS | Status: DC

## 2018-10-22 MED ORDER — ETOMIDATE 2 MG/ML IV SOLN: 24.0000 mg | Freq: Once | INTRAVENOUS | Status: DC

## 2018-10-22 MED ORDER — SODIUM CHLORIDE 0.9 % IV BOLUS: 1000.0000 mL | Freq: Once | INTRAVENOUS | Status: DC

## 2018-10-22 MED ORDER — ACETAMINOPHEN 500 MG PO TABS
1000.0000 mg | ORAL_TABLET | Freq: Once | ORAL | Status: AC
  Administered 2018-10-22: 11:00:00 1000 mg via ORAL
  Filled 2018-10-22: qty 2

## 2018-10-22 MED ORDER — FENTANYL CITRATE (PF) 100 MCG/2ML IJ SOLN
75.0000 ug | Freq: Once | INTRAMUSCULAR | Status: AC
  Administered 2018-10-22: 75 ug via INTRAVENOUS
  Filled 2018-10-22: qty 2

## 2018-10-22 MED ORDER — TAMSULOSIN HCL 0.4 MG PO CAPS
0.4000 mg | ORAL_CAPSULE | Freq: Every day | ORAL | Status: DC
  Administered 2018-10-23 – 2018-10-31 (×8): 0.4 mg via ORAL
  Filled 2018-10-22 (×9): qty 1

## 2018-10-22 MED ORDER — ROCURONIUM BROMIDE 50 MG/5ML IV SOLN: 80.0000 mg | Freq: Once | INTRAVENOUS | Status: DC

## 2018-10-22 MED ORDER — THYROID 120 MG PO TABS
120.0000 mg | ORAL_TABLET | Freq: Every day | ORAL | Status: DC
  Administered 2018-10-23 – 2018-10-31 (×9): 120 mg via ORAL
  Filled 2018-10-22 (×9): qty 1

## 2018-10-22 MED ORDER — ASPIRIN EC 81 MG PO TBEC
81.0000 mg | DELAYED_RELEASE_TABLET | Freq: Every day | ORAL | Status: DC
  Administered 2018-10-23 – 2018-10-31 (×9): 81 mg via ORAL
  Filled 2018-10-22 (×9): qty 1

## 2018-10-22 MED ORDER — EZETIMIBE 10 MG PO TABS
10.0000 mg | ORAL_TABLET | Freq: Every day | ORAL | Status: DC
  Administered 2018-10-23 – 2018-10-31 (×9): 10 mg via ORAL
  Filled 2018-10-22 (×9): qty 1

## 2018-10-22 MED ORDER — FENTANYL CITRATE (PF) 100 MCG/2ML IJ SOLN
75.0000 ug | Freq: Once | INTRAMUSCULAR | Status: AC
  Administered 2018-10-22: 17:00:00 75 ug via INTRAVENOUS
  Filled 2018-10-22: qty 2

## 2018-10-22 MED ORDER — ONDANSETRON HCL 4 MG/2ML IJ SOLN: 4.0000 mg | Freq: Four times a day (QID) | INTRAMUSCULAR | Status: DC | PRN

## 2018-10-22 MED ORDER — ONDANSETRON HCL 4 MG PO TABS
4.0000 mg | ORAL_TABLET | Freq: Four times a day (QID) | ORAL | Status: DC | PRN
  Administered 2018-10-30: 14:00:00 4 mg via ORAL
  Filled 2018-10-22: qty 1

## 2018-10-22 NOTE — ED Notes (Signed)
Patient transported to CT 

## 2018-10-22 NOTE — Telephone Encounter (Signed)
Will forward to Dr [name] so he will be aware

## 2018-10-22 NOTE — Telephone Encounter (Signed)
Please advise 

## 2018-10-22 NOTE — ED Notes (Signed)
ED TO INPATIENT HANDOFF REPORT  ED Nurse Name and Phone #: [phone] [name] Name/Age/Gender [name] Glass 78 y.o. male Room/Bed: 213Y/865H  Code Status   Code Status: Full Code  Home/SNF/Other Home Patient oriented to: self, place, time and situation Is this baseline? Yes   Triage Complete: Triage complete  Chief Complaint WEAKNESS,CHILLS  Triage Note Pt arrives to ED from home with complaints of sudden shortness of breath and chest tightness this morning while laying in bed. Pt stated all this weak he has felt weak and had chills all night last night. Pt stated that he pulled a muscle in his back last Thursday that has been getting worse. EMS recorded a temp of 100.51F. Pt O2 at 89% room air. Pt stages back pain is 10/10 and chest pain is 3/10. Pt has labored breathing on arrival and looks very uncomfortable sitting up in bed.    Allergies Allergies  Allergen Reactions  . Morphine And Related     Had "crazy dreams" felt "crazy" per pt.  . Bactrim [Sulfamethoxazole-Trimethoprim]   . Statins     Level of Care/Admitting Diagnosis ED Disposition    ED Disposition Condition [facility]: [place] [[id number]]  Level of Care: Telemetry Cardiac [103]  Covid Evaluation: Screening Protocol (No Symptoms)  Diagnosis: Sepsis [facility]) [[id number]]  Admitting Physician: Guilford Shi [[id number]]  Attending Physician: Guilford Shi [[id number]]  Estimated length of stay: past midnight tomorrow  Certification:: I certify this patient will need inpatient services for at least 2 midnights  PT Class (Do Not Modify): Inpatient [101]  PT Acc Code (Do Not Modify): Private [1]       B Medical/Surgery History Past Medical History:  Diagnosis Date  . Arthritis   . Coronary artery disease   . GERD (gastroesophageal reflux disease)   . H/O bronchitis   . H/O hiatal hernia   . History of kidney stones last in 1976  . Hyperlipidemia    statin  intolerant, under control  . Hypertension    under control  . Hypothyroidism   . Sleep apnea    hx of, surgery to reverse   Past Surgical History:  Procedure Laterality Date  . CARDIAC CATHETERIZATION Right 2008   5 heart stents  . CATARACT EXTRACTION Bilateral 6 years ago  . CYSTOSCOPY N/A 08/06/2012   Procedure: CYSTOSCOPY;  Surgeon: [name], MD;  Location: [facility];  Service: Urology;  Laterality: N/A;  . ESOPHAGOGASTRIC FUNDOPLICATION  2536   spleen removed, 5 pints of blood given  . HERNIA REPAIR  1985   x4  . INGUINAL HERNIA REPAIR  1992  . PROSTATE ABLATION  2013   "laser ablation"  . TONSILLECTOMY  as child  . TOTAL KNEE ARTHROPLASTY Right 08/03/2014   Procedure: TOTAL RIGHT KNEE ARTHROPLASTY;  Surgeon: [name], MD;  Location: [facility];  Service: Orthopedics;  Laterality: Right;  . TRANSURETHRAL RESECTION OF PROSTATE N/A 08/06/2012   Procedure: Almyra Free OF PROSTATE WITH GYRUS ;  Surgeon: [name], MD;  Location: [facility];  Service: Urology;  Laterality: N/A;  . UMBILICAL HERNIA REPAIR  1990  . UVULECTOMY  2005   for sleep apnea  . UVULOPALATOPHARYNGOPLASTY  6 years ago     A IV Location/Drains/Wounds Patient Lines/Drains/Airways Status   Active Line/Drains/Airways    Name:   Placement date:   Placement time:   Site:   Days:   Peripheral IV 10/22/18 Right Antecubital  10/22/18    1024    Antecubital   less than 1   Peripheral IV 10/22/18 Left Forearm   10/22/18    1041    Forearm   less than 1   Incision 08/06/12 Perineum   08/06/12    1134     2268   Incision (Closed) 08/03/14 Leg   08/03/14    1258     1541          Intake/Output Last 24 hours  Intake/Output Summary (Last 24 hours) at 10/22/2018 2132 Last data filed at 10/22/2018 1315 Gross per 24 hour  Intake 2300 ml  Output -  Net 2300 ml    Labs/Imaging Results for orders placed or performed during the hospital encounter of 10/22/18 (from the past 48 hour(s))  SARS Coronavirus 2 (CEPHEID-  Performed in [facility] hospital lab), Hosp Order     Status: None   Collection Time: 10/22/18 10:47 AM  Result Value Ref Range   SARS Coronavirus 2 NEGATIVE NEGATIVE    Comment: (NOTE) If result is NEGATIVE SARS-CoV-2 target nucleic acids are NOT DETECTED. The SARS-CoV-2 RNA is generally detectable in upper and lower  respiratory specimens during the acute phase of infection. The lowest  concentration of SARS-CoV-2 viral copies this assay can detect is 250  copies / mL. A negative result does not preclude SARS-CoV-2 infection  and should not be used as the sole basis for treatment or other  patient management decisions.  A negative result may occur with  improper specimen collection / handling, submission of specimen other  than nasopharyngeal swab, presence of viral mutation(s) within the  areas targeted by this assay, and inadequate number of viral copies  (<250 copies / mL). A negative result must be combined with clinical  observations, patient history, and epidemiological information. If result is POSITIVE SARS-CoV-2 target nucleic acids are DETECTED. The SARS-CoV-2 RNA is generally detectable in upper and lower  respiratory specimens dur ing the acute phase of infection.  Positive  results are indicative of active infection with SARS-CoV-2.  Clinical  correlation with patient history and other diagnostic information is  necessary to determine patient infection status.  Positive results do  not rule out bacterial infection or co-infection with other viruses. If result is PRESUMPTIVE POSTIVE SARS-CoV-2 nucleic acids MAY BE PRESENT.   A presumptive positive result was obtained on the submitted specimen  and confirmed on repeat testing.  While 2019 novel coronavirus  (SARS-CoV-2) nucleic acids may be present in the submitted sample  additional confirmatory testing may be necessary for epidemiological  and / or clinical management purposes  to differentiate between  SARS-CoV-2  and other Sarbecovirus currently known to infect humans.  If clinically indicated additional testing with an alternate test  methodology [phone]) is advised. The SARS-CoV-2 RNA is generally  detectable in upper and lower respiratory sp ecimens during the acute  phase of infection. The expected result is Negative. Fact Sheet for Patients:  StrictlyIdeas.no Fact Sheet for Healthcare Providers: BankingDealers.co.za This test is not yet approved or cleared by the [place] FDA and has been authorized for detection and/or diagnosis of SARS-CoV-2 by FDA under an Emergency Use Authorization (EUA).  This EUA will remain in effect (meaning this test can be used) for the duration of the COVID-19 declaration under Section 564(b)(1) of the Act, 21 U.S.C. section 360bbb-3(b)(1), unless the authorization is terminated or revoked sooner. Performed at [facility], [place] [address], [place], [place] [address]  Comprehensive metabolic panel     Status: Abnormal   Collection Time: 10/22/18 10:50 AM  Result Value Ref Range   Sodium 141 135 - 145 mmol/L   Potassium 4.4 3.5 - 5.1 mmol/L   Chloride 108 98 - 111 mmol/L   CO2 17 (L) 22 - 32 mmol/L   Glucose, Bld 162 (H) 70 - 99 mg/dL   BUN 13 8 - 23 mg/dL   Creatinine, Ser 1.24 0.61 - 1.24 mg/dL   Calcium 10.3 8.9 - 10.3 mg/dL   Total Protein 7.1 6.5 - 8.1 g/dL   Albumin 3.7 3.5 - 5.0 g/dL   AST 43 (H) 15 - 41 U/L   ALT 25 0 - 44 U/L   Alkaline Phosphatase 66 38 - 126 U/L   Total Bilirubin 1.8 (H) 0.3 - 1.2 mg/dL   GFR calc non Af Amer 55 (L) >60 mL/min   GFR calc Af Amer >60 >60 mL/min   Anion gap 16 (H) 5 - 15    Comment: Performed at [facility], [address] [place], [place] [address]  CBC with Differential     Status: Abnormal   Collection Time: 10/22/18 10:50 AM  Result Value Ref Range   WBC 5.0 4.0 - 10.5 K/uL   RBC 5.83 (H) 4.22 - 5.81 MIL/uL   Hemoglobin 18.2 (H) 13.0 -  17.0 g/dL   HCT 54.3 (H) 39.0 - 52.0 %   MCV 93.1 80.0 - 100.0 fL   MCH 31.2 26.0 - 34.0 pg   MCHC 33.5 30.0 - 36.0 g/dL   RDW 19.0 (H) 11.5 - 15.5 %   Platelets 261 150 - 400 K/uL   nRBC 0.0 0.0 - 0.2 %   Neutrophils Relative % 76 %   Neutro Abs 3.7 1.7 - 7.7 K/uL   Lymphocytes Relative 22 %   Lymphs Abs 1.0 0.7 - 4.0 K/uL   Monocytes Relative 1 %   Monocytes Absolute 0.0 (L) 0.1 - 1.0 K/uL   Eosinophils Relative 1 %   Eosinophils Absolute 0.0 0.0 - 0.5 K/uL   Basophils Relative 0 %   Basophils Absolute 0.0 0.0 - 0.1 K/uL   Immature Granulocytes 0 %   Abs Immature Granulocytes 0.02 0.00 - 0.07 K/uL    Comment: Performed at [facility], [place] [address] [place], [place] [address]  Troponin I - ONCE - STAT     Status: Abnormal   Collection Time: 10/22/18 10:50 AM  Result Value Ref Range   Troponin I 0.07 (HH) <0.03 ng/mL    Comment: CRITICAL RESULT CALLED TO, READ BACK BY AND VERIFIED WITH: FOUNTAIN,M RN @ [phone] 10/22/18 LEONARD,A Performed at [facility], [place] [address] [place], [place] [address]   Brain natriuretic peptide     Status: None   Collection Time: 10/22/18 10:51 AM  Result Value Ref Range   B Natriuretic Peptide 60.5 0.0 - 100.0 pg/mL    Comment: Performed at [place] [address], [place], [place] [address]  Blood culture (routine x 2)     Status: None (Preliminary result)   Collection Time: 10/22/18 10:52 AM  Result Value Ref Range   Specimen Description BLOOD BLOOD LEFT FOREARM    Special Requests      BOTTLES DRAWN AEROBIC AND ANAEROBIC Blood Culture adequate volume   Culture  Setup Time      IN BOTH AEROBIC AND ANAEROBIC BOTTLES GRAM POSITIVE COCCI Organism ID to follow Performed at  [facility], [place] [address], [place], [place] [address]    Culture PENDING    Report Status PENDING   Lactic acid, plasma     Status: Abnormal   Collection Time: 10/22/18 11:22 AM  Result Value Ref Range   Lactic Acid, Venous 4.3 (HH) 0.5  - 1.9 mmol/L    Comment: CRITICAL RESULT CALLED TO, READ BACK BY AND VERIFIED WITH: FOUNTAIN,M RN @ [phone] 10/22/18 LEONARD,A Performed at [facility], [place] [address], [place], [place] [address]   Urinalysis, Routine w reflex microscopic     Status: Abnormal   Collection Time: 10/22/18 11:22 AM  Result Value Ref Range   Color, Urine YELLOW YELLOW   APPearance HAZY (A) CLEAR   Specific Gravity, Urine 1.010 1.005 - 1.030   pH 5.0 5.0 - 8.0   Glucose, UA NEGATIVE NEGATIVE mg/dL   Hgb urine dipstick SMALL (A) NEGATIVE   Bilirubin Urine NEGATIVE NEGATIVE   Ketones, ur NEGATIVE NEGATIVE mg/dL   Protein, ur 100 (A) NEGATIVE mg/dL   Nitrite NEGATIVE NEGATIVE   Leukocytes,Ua NEGATIVE NEGATIVE   RBC / HPF 0-5 0 - 5 RBC/hpf   WBC, UA 0-5 0 - 5 WBC/hpf   Bacteria, UA RARE (A) NONE SEEN   Squamous Epithelial / LPF 0-5 0 - 5   Mucus PRESENT    Hyaline Casts, UA PRESENT     Comment: Performed at [facility], [place] [address], [place], [place] [address]  Blood culture (routine x 2)     Status: None (Preliminary result)   Collection Time: 10/22/18 11:23 AM  Result Value Ref Range   Specimen Description BLOOD RIGHT ANTECUBITAL    Special Requests      BOTTLES DRAWN AEROBIC AND ANAEROBIC Blood Culture adequate volume   Culture  Setup Time      ANAEROBIC BOTTLE ONLY GRAM POSITIVE COCCI Performed at [facility], [place] [address] [place], [place] [address]    Culture PENDING    Report Status PENDING   Lactic acid, plasma     Status: Abnormal   Collection Time: 10/22/18  1:15 PM  Result Value Ref Range   Lactic Acid, Venous 3.1 (HH) 0.5 - 1.9 mmol/L    Comment: CRITICAL RESULT CALLED TO, READ BACK BY AND VERIFIED WITH: [place] [phone] [id number] BY A BENNETT Performed at [facility], [place] [address] [place], [place] [address]   Influenza panel by PCR (type A & B)     Status: None   Collection Time: 10/22/18  5:44 PM  Result Value Ref Range   Influenza A By PCR  NEGATIVE NEGATIVE   Influenza B By PCR NEGATIVE NEGATIVE    Comment: (NOTE) The Xpert Xpress Flu assay is intended as an aid in the diagnosis of  influenza and should not be used as a sole basis for treatment.  This  assay is FDA approved for nasopharyngeal swab specimens only. Nasal  washings and aspirates are unacceptable for Xpert Xpress Flu testing. Performed at [facility], [place] [address] [place], [place] [address]   Respiratory Panel by PCR     Status: None   Collection Time: 10/22/18  5:44 PM  Result Value Ref Range   Adenovirus NOT DETECTED NOT DETECTED   Coronavirus 229E NOT DETECTED NOT DETECTED    Comment: (NOTE) The Coronavirus on the Respiratory Panel, DOES NOT test for the novel  Coronavirus (2019 nCoV)    Coronavirus HKU1 NOT  DETECTED NOT DETECTED   Coronavirus NL63 NOT DETECTED NOT DETECTED   Coronavirus OC43 NOT DETECTED NOT DETECTED   Metapneumovirus NOT DETECTED NOT DETECTED   Rhinovirus / Enterovirus NOT DETECTED NOT DETECTED   Influenza A NOT DETECTED NOT DETECTED   Influenza B NOT DETECTED NOT DETECTED   Parainfluenza Virus 1 NOT DETECTED NOT DETECTED   Parainfluenza Virus 2 NOT DETECTED NOT DETECTED   Parainfluenza Virus 3 NOT DETECTED NOT DETECTED   Parainfluenza Virus 4 NOT DETECTED NOT DETECTED   Respiratory Syncytial Virus NOT DETECTED NOT DETECTED   Bordetella pertussis NOT DETECTED NOT DETECTED   Chlamydophila pneumoniae NOT DETECTED NOT DETECTED   Mycoplasma pneumoniae NOT DETECTED NOT DETECTED    Comment: Performed at [facility], [place] [address] [place], [place] [address]  SARS Coronavirus 2 (CEPHEID- Performed in [facility] hospital lab), Hosp Order     Status: None   Collection Time: 10/22/18  5:44 PM  Result Value Ref Range   SARS Coronavirus 2 NEGATIVE NEGATIVE    Comment: (NOTE) If result is NEGATIVE SARS-CoV-2 target nucleic acids are NOT DETECTED. The SARS-CoV-2 RNA is generally detectable in upper and lower   respiratory specimens during the acute phase of infection. The lowest  concentration of SARS-CoV-2 viral copies this assay can detect is 250  copies / mL. A negative result does not preclude SARS-CoV-2 infection  and should not be used as the sole basis for treatment or other  patient management decisions.  A negative result may occur with  improper specimen collection / handling, submission of specimen other  than nasopharyngeal swab, presence of viral mutation(s) within the  areas targeted by this assay, and inadequate number of viral copies  (<250 copies / mL). A negative result must be combined with clinical  observations, patient history, and epidemiological information. If result is POSITIVE SARS-CoV-2 target nucleic acids are DETECTED. The SARS-CoV-2 RNA is generally detectable in upper and lower  respiratory specimens dur ing the acute phase of infection.  Positive  results are indicative of active infection with SARS-CoV-2.  Clinical  correlation with patient history and other diagnostic information is  necessary to determine patient infection status.  Positive results do  not rule out bacterial infection or co-infection with other viruses. If result is PRESUMPTIVE POSTIVE SARS-CoV-2 nucleic acids MAY BE PRESENT.   A presumptive positive result was obtained on the submitted specimen  and confirmed on repeat testing.  While 2019 novel coronavirus  (SARS-CoV-2) nucleic acids may be present in the submitted sample  additional confirmatory testing may be necessary for epidemiological  and / or clinical management purposes  to differentiate between  SARS-CoV-2 and other Sarbecovirus currently known to infect humans.  If clinically indicated additional testing with an alternate test  methodology [phone]) is advised. The SARS-CoV-2 RNA is generally  detectable in upper and lower respiratory sp ecimens during the acute  phase of infection. The expected result is Negative. Fact  Sheet for Patients:  StrictlyIdeas.no Fact Sheet for Healthcare Providers: BankingDealers.co.za This test is not yet approved or cleared by the [place] FDA and has been authorized for detection and/or diagnosis of SARS-CoV-2 by FDA under an Emergency Use Authorization (EUA).  This EUA will remain in effect (meaning this test can be used) for the duration of the COVID-19 declaration under Section 564(b)(1) of the Act, 21 U.S.C. section 360bbb-3(b)(1), unless the authorization is terminated or revoked sooner. Performed at [facility], [place] [address], [place], [place]  [address]   Creatinine, serum     Status: Abnormal   Collection Time: 10/22/18  8:15 PM  Result Value Ref Range   Creatinine, Ser 1.97 (H) 0.61 - 1.24 mg/dL   GFR calc non Af Amer 32 (L) >60 mL/min   GFR calc Af Amer 37 (L) >60 mL/min    Comment: Performed at [facility] [place] [address], [place], [place] [address]  Troponin I - Now Then Q6H     Status: Abnormal   Collection Time: 10/22/18  8:15 PM  Result Value Ref Range   Troponin I 0.26 (HH) <0.03 ng/mL    Comment: CRITICAL VALUE NOTED.  VALUE IS CONSISTENT WITH PREVIOUSLY REPORTED AND CALLED VALUE. Performed at [facility], [place] [address] [place], [place] [address]    Ct Angio Chest Pe W And/or Wo Contrast  Result Date: 10/22/2018 CLINICAL DATA:  Shortness of breath, chest tightness EXAM: CT ANGIOGRAPHY CHEST WITH CONTRAST TECHNIQUE: Multidetector CT imaging of the chest was performed using the standard protocol during bolus administration of intravenous contrast. Multiplanar CT image reconstructions and MIPs were obtained to evaluate the vascular anatomy. CONTRAST:  188mL OMNIPAQUE IOHEXOL 350 MG/ML SOLN COMPARISON:  None. FINDINGS: Cardiovascular: Satisfactory opacification of the pulmonary arteries to the segmental level. No evidence of pulmonary embolism. Cardiomegaly. Extensive 3 vessel coronary  artery calcifications and/or stents. Left chest multi lead pacer. No pericardial effusion. Mediastinum/Nodes: No enlarged mediastinal, hilar, or axillary lymph nodes. Thyroid gland, trachea, and esophagus demonstrate no significant findings. Lungs/Pleura: Bibasilar scarring or atelectasis. No pleural effusion or pneumothorax. Upper Abdomen: No acute abnormality. Postoperative findings of splenectomy. Musculoskeletal: No chest wall abnormality. No acute or significant osseous findings. Review of the MIP images confirms the above findings. IMPRESSION: 1.  Negative examination for pulmonary embolism. 2.  Bibasilar scarring or atelectasis. 3.  Cardiomegaly and coronary artery disease. Electronically Signed   By: Eddie Candle M.D.   On: 10/22/2018 16:14   Dg Chest Portable 1 View  Result Date: 10/22/2018 CLINICAL DATA:  Dyspnea and shortness of breath EXAM: PORTABLE CHEST 1 VIEW COMPARISON:  08/13/2018 FINDINGS: Cardiac shadow is enlarged but accentuated by the portable technique. Pacing device is noted. No focal infiltrate or sizable effusion is seen. No acute bony abnormality is noted. IMPRESSION: No acute abnormality noted. Electronically Signed   By: [place] Catalina M.D.   On: 10/22/2018 11:48    Pending Labs Unresulted Labs (From admission, onward)    Start     Ordered   10/29/18 0500  Creatinine, serum  (enoxaparin (LOVENOX)    CrCl >/= 30 ml/min)  Weekly,   R    Comments:  while on enoxaparin therapy    10/22/18 2000   10/23/18 5638  Basic metabolic panel  Tomorrow morning,   R     10/22/18 2000   10/23/18 0500  CBC  Tomorrow morning,   R     10/22/18 2000   10/22/18 2130  CBC  Once,   STAT     10/22/18 2130   10/22/18 2129  Sedimentation rate  Once,   R     10/22/18 2128   10/22/18 2003  Expectorated sputum assessment w rflx to resp cult  Once,   R    Question:  Patient immune status  Answer:  Normal   10/22/18 2002   10/22/18 2000  Troponin I - Now Then Q6H  Now then every 6 hours,   R      10/22/18 2000  10/22/18 1956  Urine culture  Once,   R     10/22/18 2000   10/22/18 1052  Urine culture  ONCE - STAT,   STAT     10/22/18 1051   10/22/18 1052  Blood Culture ID Panel (Reflexed)  Once,   STAT     10/22/18 1052          Vitals/Pain Today's Vitals   10/22/18 1945 10/22/18 1958 10/22/18 2000 10/22/18 2100  BP: 97/70  (!) 106/54 103/76  Pulse: 80  77 79  Resp: 15  20 18   Temp:      TempSrc:      SpO2: 96%  94% 97%  Weight:      Height:      PainSc:  5       Isolation Precautions Droplet and Contact precautions  Medications Medications  pravastatin (PRAVACHOL) tablet 40 mg (has no administration in time range)  aspirin EC tablet 81 mg (has no administration in time range)  anastrozole (ARIMIDEX) tablet 1 mg (has no administration in time range)  ezetimibe (ZETIA) tablet 10 mg (has no administration in time range)  thyroid (ARMOUR) tablet 60-120 mg (has no administration in time range)  pantoprazole (PROTONIX) EC tablet 40 mg (has no administration in time range)  tamsulosin (FLOMAX) capsule 0.4 mg (has no administration in time range)  ticagrelor (BRILINTA) tablet 90 mg (has no administration in time range)  fluticasone (FLONASE) 50 MCG/ACT nasal spray 2 spray (has no administration in time range)  enoxaparin (LOVENOX) injection 40 mg (has no administration in time range)  ondansetron (ZOFRAN) tablet 4 mg (has no administration in time range)    Or  ondansetron (ZOFRAN) injection 4 mg (has no administration in time range)  ipratropium-albuterol (DUONEB) 0.5-2.5 (3) MG/3ML nebulizer solution 3 mL (has no administration in time range)  ceFEPIme (MAXIPIME) 2 g in sodium chloride 0.9 % 100 mL IVPB (has no administration in time range)  ceFEPIme (MAXIPIME) 2 g in sodium chloride 0.9 % 100 mL IVPB (0 g Intravenous Stopped 10/22/18 1205)  vancomycin (VANCOCIN) IVPB 1000 mg/200 mL premix (0 mg Intravenous Stopped 10/22/18 1240)  acetaminophen (TYLENOL) tablet 1,000  mg (1,000 mg Oral Given 10/22/18 1126)  sodium chloride 0.9 % bolus 1,000 mL (0 mLs Intravenous Stopped 10/22/18 1240)  fentaNYL (SUBLIMAZE) injection 75 mcg (75 mcg Intravenous Given 10/22/18 1126)  sodium chloride 0.9 % bolus 1,000 mL (0 mLs Intravenous Stopped 10/22/18 1315)  iohexol (OMNIPAQUE) 350 MG/ML injection 100 mL (100 mLs Intravenous Contrast Given 10/22/18 1606)  fentaNYL (SUBLIMAZE) injection 75 mcg (75 mcg Intravenous Given 10/22/18 1657)  sodium chloride 0.9 % bolus 1,000 mL (1,000 mLs Intravenous New Bag/Given 10/22/18 1814)    Mobility walks High fall risk   Focused Assessments Cardiac Assessment Handoff:    Lab Results  Component Value Date   CKTOTAL 186 06/07/2016   CKMB 5.3 (H) 06/07/2016   TROPONINI 0.26 (HH) 10/22/2018   No results found for: DDIMER Does the Patient currently have chest pain? No  , Pulmonary Assessment Handoff:  Lung sounds: Bilateral Breath Sounds: Diminished L Breath Sounds: Diminished R Breath Sounds: Diminished O2 Device: Nasal Cannula O2 Flow Rate (L/min): 4 L/min      R Recommendations: See Admitting Provider Note  Report given to:   Additional Notes: Pt on 4L nasal cannula

## 2018-10-22 NOTE — Telephone Encounter (Signed)
Thx

## 2018-10-22 NOTE — ED Notes (Addendum)
Nurse Navigator communication: Spouse of the patient has been given an update on patient status. She expresses her concerns for possible covid as he has had chills and symptoms at home. She was given the opportunity to speak with Dr. Wilson Singer. She will be provided with updates. Current plan is to intubate.

## 2018-10-22 NOTE — ED Triage Notes (Addendum)
Pt arrives to ED from home with complaints of sudden shortness of breath and chest tightness this morning while laying in bed. Pt stated all this weak he has felt weak and had chills all night last night. Pt stated that he pulled a muscle in his back last Thursday that has been getting worse. EMS recorded a temp of 100.64F. Pt O2 at 89% room air. Pt stages back pain is 10/10 and chest pain is 3/10. Pt has labored breathing on arrival and looks very uncomfortable sitting up in bed.

## 2018-10-22 NOTE — Telephone Encounter (Signed)
Patient is at [facility] ED.

## 2018-10-22 NOTE — Telephone Encounter (Signed)
New message   Patient's wife wants Dr. [name] to know that ED at this time due to patient shivering and having sob.

## 2018-10-22 NOTE — Telephone Encounter (Signed)
Pt currently admitted.

## 2018-10-22 NOTE — ED Provider Notes (Signed)
Patient presents with shortness of breath, productive cough and fever.  Concern for sepsis initially.  Given broad-spectrum antibiotics and IV fluids.  Chest x-ray and CT angio chest without definite evidence of infectious process.  Discussed with hospitalist.  Will see patient emergency department and admit.  Asked to have infectious disease consulted. Spoke with ID and states that could repeat COVID test.  Advises sending respiratory panel.   Julianne Rice, MD 10/22/18 [phone]

## 2018-10-22 NOTE — ED Provider Notes (Signed)
[name] Provider Note   CSN: [id number] Arrival date & time: 10/22/18  1015    History   Chief Complaint Chief Complaint  Patient presents with   Shortness of Breath    HPI [name] Glass is a 78 y.o. male.     HPI   78yM with dyspnea. Onset this morning while in bed. Over the past several days he hasn't felt well. Generally weak and chills. Some pain in his lower back and thinks he may have pulled a muscle in it 4-5 days ago but doesn't remember any specific trauma or strain. Occasional nonproductive cough. No urinary complaints. No v/d. No sick contacts that he is aware of.   Past Medical History:  Diagnosis Date   Arthritis    Coronary artery disease    GERD (gastroesophageal reflux disease)    H/O bronchitis    H/O hiatal hernia    History of kidney stones last in 1976   Hyperlipidemia    statin intolerant, under control   Hypertension    under control   Hypothyroidism    Sleep apnea    hx of, surgery to reverse    Patient Active Problem List   Diagnosis Date Noted   RTI (respiratory tract infection) 08/13/2018   Flu-like symptoms 08/13/2018   Syncope 07/02/2018   Cellulitis of forearm, right 06/27/2018   Prediabetes 06/27/2018   Right knee injury, initial encounter 06/27/2018   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   Routine general medical examination at a health care facility 06/29/2017   PSA elevation 06/26/2017   Benign prostatic hyperplasia without lower urinary tract symptoms 05/21/2017   Seasonal allergic rhinitis due to pollen 05/18/2017   Chronic renal disease, stage 2, mildly decreased glomerular filtration rate (GFR) between 60-89 mL/min/1.73 square meter 06/14/2016   Cough 68/34/1962   Systolic murmur 22/97/9892   Gastroesophageal reflux disease without esophagitis 12/19/2015   Essential hypertension, benign 12/19/2015   Hyperlipidemia with target LDL less than 70 12/19/2015    OA (osteoarthritis) of knee 08/03/2014   Coronary artery disease 07/10/2014    Past Surgical History:  Procedure Laterality Date   CARDIAC CATHETERIZATION Right 2008   5 heart stents   CATARACT EXTRACTION Bilateral 6 years ago   CYSTOSCOPY N/A 08/06/2012   Procedure: CYSTOSCOPY;  Surgeon: [name], MD;  Location: [facility];  Service: Urology;  Laterality: N/A;   ESOPHAGOGASTRIC FUNDOPLICATION  1194   spleen removed, 5 pints of blood given   [name] Glass   x4   [name]  2013   "laser ablation"   TONSILLECTOMY  as child   TOTAL KNEE ARTHROPLASTY Right 08/03/2014   Procedure: TOTAL RIGHT KNEE ARTHROPLASTY;  Surgeon: [name], MD;  Location: [facility];  Service: Orthopedics;  Laterality: Right;   TRANSURETHRAL RESECTION OF PROSTATE N/A 08/06/2012   Procedure: Almyra Free OF PROSTATE WITH GYRUS ;  Surgeon: [name], MD;  Location: [facility];  Service: Urology;  Laterality: N/A;   UMBILICAL HERNIA REPAIR  1990   UVULECTOMY  2005   for sleep apnea   UVULOPALATOPHARYNGOPLASTY  6 years ago        Home Medications    Prior to Admission medications   Medication Sig Start Date End Date Taking? Authorizing Provider  acetaminophen (TYLENOL) 650 MG CR tablet Take 1,300 mg by mouth at bedtime. Reported on 12/16/2015    [provider]  acidophilus (RISAQUAD) CAPS capsule Take  1 capsule by mouth 2 (two) times daily.    [provider]  acyclovir (ZOVIRAX) 200 MG capsule TAKE 1 CAPSULE (200 MG TOTAL) BY MOUTH 3 (THREE) TIMES DAILY. 08/29/18   [name], MD  anastrozole (ARIMIDEX) 1 MG tablet Take 1 mg by mouth 2 (two) times a week.  10/06/15   [provider]  aspirin EC 81 MG tablet Take 1 tablet (81 mg total) by mouth daily. 07/30/17   [name], MD  BRILINTA 90 MG TABS tablet Take 90 mg by mouth 2 (two) times daily.  06/23/18   [provider]  cefdinir (OMNICEF) 300 MG capsule TAKE  1 CAPSULE BY MOUTH TWICE A DAY FOR 10 DAYS 09/03/18   [name], MD  DEXILANT 60 MG capsule Take 1 capsule by mouth daily. 11/29/15   [provider]  ezetimibe (ZETIA) 10 MG tablet TAKE 1 TABLET BY MOUTH EVERY DAY 05/17/18   [name], MD  fluticasone [facility]) 50 MCG/ACT nasal spray USE DAILY AS DIRECTED 03/03/18   [name], MD  liothyronine (CYTOMEL) 25 MCG tablet TAKE 1 TABLET UPON WAKING. 05/07/17   [provider]  NON FORMULARY Decoanate Injection    [provider]  Nutritional Supplements (DHEA PO) Take 25 mg by mouth.    [provider]  Pitavastatin Calcium (LIVALO) 4 MG TABS Take 1 tablet (4 mg total) by mouth daily. 06/27/18   [name], MD  Plant Sterols and [place]ols (CHOLEST OFF PO) Take 900 mg by mouth.    [provider]  potassium gluconate 595 MG TABS Take 595 mg by mouth every evening.    [provider]  tamsulosin (FLOMAX) 0.4 MG CAPS capsule TAKE 1 CAPSULE BY MOUTH EVERY DAY IN THE MORNING 02/05/18   [provider]  testosterone cypionate (DEPOTESTOTERONE CYPIONATE) 100 MG/ML injection Inject into the muscle once a week. For IM use only    [provider]  thyroid (ARMOUR) 60 MG tablet Take 60-120 mg by mouth 2 (two) times daily.     [provider]    Family History Family History  Problem Relation Age of Onset   Heart attack Father 32       lived to 53   Alzheimer's disease Mother    Stroke Mother    Breast cancer Mother    Alzheimer's disease Maternal Grandmother    Stroke Maternal Grandmother    Lung cancer Maternal Grandmother    Lung cancer Maternal Grandfather    Cancer Paternal Grandmother        type unknown   Diabetes Paternal Grandfather    Other Paternal Grandfather        Growth in the back of the head    Social History Social History   Tobacco Use   Smoking status: Never Smoker   Smokeless tobacco: Never Used  Substance Use  Topics   Alcohol use: Yes    Alcohol/week: 4.0 standard drinks    Types: 4 Standard drinks or equivalent per week    Comment: 3 drinks at night    Drug use: No     Allergies   Morphine and related; Bactrim [sulfamethoxazole-trimethoprim]; and Statins   Review of Systems Review of Systems  All systems reviewed and negative, other than as noted in HPI.  Physical Exam Updated Vital Signs BP (!) 179/74    Pulse (!) 121    Temp (!) 102.5 F (39.2 C) (Oral)    Resp [name])  21    Ht 6' (1.829 m)    Wt 88.5 kg    SpO2 91%    BMI 26.45 kg/m   Physical Exam Vitals signs and nursing note reviewed.  Constitutional:      General: He is not in acute distress.    Appearance: He is well-developed.  HENT:     Head: Normocephalic and atraumatic.  Eyes:     General:        Right eye: No discharge.        Left eye: No discharge.     Conjunctiva/sclera: Conjunctivae normal.  Neck:     Musculoskeletal: Neck supple.  Cardiovascular:     Rate and Rhythm: Regular rhythm. Tachycardia present.     Heart sounds: Normal heart sounds. No murmur. No friction rub. No gallop.   Pulmonary:     Effort: Respiratory distress present.     Comments: Tachypnea but breath sounds clear.  Abdominal:     General: There is no distension.     Palpations: Abdomen is soft.     Tenderness: There is no abdominal tenderness.  Musculoskeletal:        General: No tenderness.  Skin:    General: Skin is warm and dry.  Neurological:     Mental Status: He is alert.  Psychiatric:        Behavior: Behavior normal.        Thought Content: Thought content normal.      ED Treatments / Results  Labs (all labs ordered are listed, but only abnormal results are displayed) Labs Reviewed  URINE CULTURE - Abnormal; Notable for the following components:      Result Value   Culture   (*)    Value: <10,000 COLONIES/mL INSIGNIFICANT GROWTH Performed at [facility], [address] [place], [place] [address]     All other components within normal limits  BLOOD CULTURE ID PANEL (REFLEXED) - Abnormal; Notable for the following components:   Streptococcus species DETECTED (*)    All other components within normal limits  LACTIC ACID, PLASMA - Abnormal; Notable for the following components:   Lactic Acid, Venous 4.3 (*)    All other components within normal limits  LACTIC ACID, PLASMA - Abnormal; Notable for the following components:   Lactic Acid, Venous 3.1 (*)    All other components within normal limits  COMPREHENSIVE METABOLIC PANEL - Abnormal; Notable for the following components:   CO2 17 (*)    Glucose, Bld 162 (*)    AST 43 (*)    Total Bilirubin 1.8 (*)    GFR calc non Af Amer 55 (*)    Anion gap 16 (*)    All other components within normal limits  CBC WITH DIFFERENTIAL/PLATELET - Abnormal; Notable for the following components:   RBC 5.83 (*)    Hemoglobin 18.2 (*)    HCT 54.3 (*)    RDW 19.0 (*)    Monocytes Absolute 0.0 (*)    All other components within normal limits  URINALYSIS, ROUTINE W REFLEX MICROSCOPIC - Abnormal; Notable for the following components:   APPearance HAZY (*)    Hgb urine dipstick SMALL (*)    Protein, ur 100 (*)    Bacteria, UA RARE (*)    All other components within normal limits  TROPONIN I - Abnormal; Notable for the following components:   Troponin I 0.07 (*)    All other components within normal limits  CREATININE, SERUM - Abnormal; Notable  for the following components:   Creatinine, Ser 1.97 (*)    GFR calc non Af Amer 32 (*)    GFR calc Af Amer 37 (*)    All other components within normal limits  BASIC METABOLIC PANEL - Abnormal; Notable for the following components:   CO2 18 (*)    BUN 26 (*)    Creatinine, Ser 1.93 (*)    GFR calc non Af Amer 32 (*)    GFR calc Af Amer 38 (*)    All other components within normal limits  CBC - Abnormal; Notable for the following components:   WBC 22.1 (*)    RDW 18.6 (*)    All other components within  normal limits  TROPONIN I - Abnormal; Notable for the following components:   Troponin I 0.26 (*)    All other components within normal limits  TROPONIN I - Abnormal; Notable for the following components:   Troponin I 0.27 (*)    All other components within normal limits  TROPONIN I - Abnormal; Notable for the following components:   Troponin I 0.20 (*)    All other components within normal limits  CBC - Abnormal; Notable for the following components:   WBC 18.5 (*)    RDW 18.9 (*)    All other components within normal limits  LACTIC ACID, PLASMA - Abnormal; Notable for the following components:   Lactic Acid, Venous 5.0 (*)    All other components within normal limits  LACTIC ACID, PLASMA - Abnormal; Notable for the following components:   Lactic Acid, Venous 3.9 (*)    All other components within normal limits  LACTIC ACID, PLASMA - Abnormal; Notable for the following components:   Lactic Acid, Venous 3.1 (*)    All other components within normal limits  LACTIC ACID, PLASMA - Abnormal; Notable for the following components:   Lactic Acid, Venous 3.3 (*)    All other components within normal limits  LACTIC ACID, PLASMA - Abnormal; Notable for the following components:   Lactic Acid, Venous 2.9 (*)    All other components within normal limits  SARS CORONAVIRUS 2 (HOSPITAL ORDER, [facility])  CULTURE, BLOOD (ROUTINE X 2)  CULTURE, BLOOD (ROUTINE X 2)  RESPIRATORY PANEL BY PCR  SARS CORONAVIRUS 2 (HOSPITAL ORDER, [facility])  URINE CULTURE  EXPECTORATED SPUTUM ASSESSMENT W REFEX TO RESP CULTURE  CULTURE, BLOOD (ROUTINE X 2)  CULTURE, BLOOD (ROUTINE X 2)  BRAIN NATRIURETIC PEPTIDE  INFLUENZA PANEL BY PCR (TYPE A & B)  SEDIMENTATION RATE  LACTIC ACID, PLASMA  CBC  COMPREHENSIVE METABOLIC PANEL  MAGNESIUM    EKG EKG Interpretation  Date/Time:  Tuesday Oct 22 2018 12:38:50 EDT Ventricular Rate:  96 PR Interval:    QRS  Duration: 160 QT Interval:  369 QTC Calculation: 467 R Axis:   -74 Text Interpretation:  Sinus rhythm Prolonged PR interval Right bundle branch block Inferior infarct, old Confirmed by Virgel Manifold [phone]) on 10/22/2018 2:50:41 PM   Radiology Ct Angio Chest Pe W And/or Wo Contrast  Result Date: 10/22/2018 CLINICAL DATA:  Shortness of breath, chest tightness EXAM: CT ANGIOGRAPHY CHEST WITH CONTRAST TECHNIQUE: Multidetector CT imaging of the chest was performed using the standard protocol during bolus administration of intravenous contrast. Multiplanar CT image reconstructions and MIPs were obtained to evaluate the vascular anatomy. CONTRAST:  181mL OMNIPAQUE IOHEXOL 350 MG/ML SOLN COMPARISON:  None. FINDINGS: Cardiovascular: Satisfactory opacification of the pulmonary  arteries to the segmental level. No evidence of pulmonary embolism. Cardiomegaly. Extensive 3 vessel coronary artery calcifications and/or stents. Left chest multi lead pacer. No pericardial effusion. Mediastinum/Nodes: No enlarged mediastinal, hilar, or axillary lymph nodes. Thyroid gland, trachea, and esophagus demonstrate no significant findings. Lungs/Pleura: Bibasilar scarring or atelectasis. No pleural effusion or pneumothorax. Upper Abdomen: No acute abnormality. Postoperative findings of splenectomy. Musculoskeletal: No chest wall abnormality. No acute or significant osseous findings. Review of the MIP images confirms the above findings. IMPRESSION: 1.  Negative examination for pulmonary embolism. 2.  Bibasilar scarring or atelectasis. 3.  Cardiomegaly and coronary artery disease. Electronically Signed   By: Eddie Candle M.D.   On: 10/22/2018 16:14   Mr Thoracic Spine Wo Contrast  Result Date: 10/23/2018 CLINICAL DATA:  Initial evaluation for acute sepsis. EXAM: MRI THORACIC AND LUMBAR SPINE WITHOUT CONTRAST TECHNIQUE: Multiplanar and multiecho pulse sequences of the thoracic and lumbar spine were obtained without intravenous  contrast. COMPARISON:  None available. FINDINGS: MRI THORACIC SPINE FINDINGS Alignment: Market dextroscoliosis of the midthoracic spine. Grade 1 anterolisthesis of T2 on T3, likely chronic and degenerative. Alignment otherwise normal with preservation of the normal thoracic kyphosis. Vertebrae: Vertebral body heights maintained without evidence for acute or chronic fracture. Few scattered chronic endplate Schmorl's nodes noted within the lower thoracic spine. Bone marrow signal intensity within normal limits. No discrete or worrisome osseous lesions. No evidence for osteomyelitis discitis or septic arthritis. Cord: Signal intensity within the thoracic spinal cord is normal. Normal cord caliber and morphology. No epidural collections. Paraspinal and other soft tissues: Paraspinous soft tissues demonstrate no acute finding. Small layering right pleural effusion with associated atelectasis. Visualized visceral structures grossly within normal limits. Disc levels: T1-2: Bilateral facet hypertrophy, greater on the right. No significant stenosis. T2-3: Grade 1 anterolisthesis. Bilateral facet hypertrophy. No spinal stenosis. Mild right foraminal narrowing. T3-4:  Negative interspace.  Mild facet hypertrophy.  No stenosis. T4-5: Negative interspace. Right greater than left facet hypertrophy. No significant stenosis. T5-6:  Negative interspace.  Mild facet hypertrophy.  No stenosis. T6-7: Negative interspace. Mild facet and ligament flavum hypertrophy. No significant stenosis. T7-8:  Negative interspace.  Mild facet hypertrophy.  No stenosis. T8-9:  Mild facet hypertrophy.  No stenosis. T9-10: Bilateral facet hypertrophy with ankylosis. No significant stenosis. T10-11:  Posterior element hypertrophy.  No stenosis. T11-12:  Bilateral facet hypertrophy.  No stenosis. T12-L1:  Negative. 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 L5-S1 disc  somewhat rudimentary. Alignment: Trace 2 mm anterolisthesis of L3 on L4. Mild levoscoliosis. Alignment otherwise normal with preservation of the normal lumbar lordosis. Vertebrae: Vertebral body heights maintained without evidence for acute or chronic fracture. Bone marrow signal intensity mildly heterogeneous but within normal limits. No worrisome osseous lesions. Minimal reactive marrow edema about the bilateral facets at L2-3 through L4-5, likely due to facet arthritis, greater on the right. No evidence for osteomyelitis or discitis. Partially visualized SI joints within normal limits. Conus medullaris and cauda equina: Conus extends to the L1 level. Conus and cauda equina appear normal. Paraspinal and other soft tissues: Mild edema seen involving the right greater than left lower posterior paraspinous musculature (series 27, image 5), likely reactive in nature due to adjacent facet degeneration. Possible muscular strain and/or injury or acute myositis could also be considered. No loculated collections. Paraspinous soft tissues demonstrate no other acute finding. Approximate 1 cm right renal cyst noted. Atherosclerotic change noted within the intra-abdominal aorta. Visualized visceral  structures otherwise unremarkable. Disc levels: L1-2:  Unremarkable. L2-3: Mild disc bulge with disc desiccation. Moderate right with mild left facet hypertrophy. Superimposed 14 mm synovial cyst noted at the lateral aspect of the right L2-3 facet (series 29, image 10). This exhibits no mass effect on the adjacent thecal sac. Thecal sac. Resultant mild canal with right lateral recess narrowing. Mild right L2 foraminal stenosis. L3-4: Trace anterolisthesis. Mild disc bulge with disc desiccation. Moderate bilateral facet hypertrophy. Resultant mild canal with bilateral lateral recess stenosis, slightly worse on the right. Mild bilateral foraminal narrowing. L4-5: Disc desiccation with minimal annular bulge. Moderate facet hypertrophy.  No significant stenosis. L5-S1: Transitional lumbosacral anatomy with rudimentary L5-S1 disc. Minimal endplate osteophytic spurring without significant disc bulge. Mild facet hypertrophy. No significant stenosis. IMPRESSION: MR THORACIC SPINE IMPRESSION 1. No evidence for acute infection or other abnormality within the thoracic spine. 2. Moderate thoracic dextroscoliosis with associated multilevel facet hypertrophy as above. No significant spinal stenosis. Mild right foraminal narrowing at T2-3. 3. Small layering right pleural effusion with associated atelectasis. MR LUMBAR SPINE IMPRESSION 1. Mild paraspinous edema involving the right greater than left lower posterior paraspinous musculature, favored to be reactive in nature due to adjacent facet degeneration. Finding could result in right-sided lower back pain. Sequelae of muscular injury and/or strain or possibly myositis could also be considered. No discrete soft tissue collections. 2. No other evidence for acute infection within the lumbar spine. No evidence for osteomyelitis or discitis. No epidural collections. 3. Mild for age multilevel disc bulging with moderate facet hypertrophy at L2-3 through L4-5 as above. Resultant mild canal with bilateral lateral recess narrowing at L2-3 and L3-4. Electronically Signed   By: Jeannine Boga M.D.   On: 10/23/2018 19:23   Mr Lumbar Spine Wo Contrast  Result Date: 10/23/2018 CLINICAL DATA:  Initial evaluation for acute sepsis. EXAM: MRI THORACIC AND LUMBAR SPINE WITHOUT CONTRAST TECHNIQUE: Multiplanar and multiecho pulse sequences of the thoracic and lumbar spine were obtained without intravenous contrast. COMPARISON:  None available. FINDINGS: MRI THORACIC SPINE FINDINGS Alignment: Market dextroscoliosis of the midthoracic spine. Grade 1 anterolisthesis of T2 on T3, likely chronic and degenerative. Alignment otherwise normal with preservation of the normal thoracic kyphosis. Vertebrae: Vertebral body heights  maintained without evidence for acute or chronic fracture. Few scattered chronic endplate Schmorl's nodes noted within the lower thoracic spine. Bone marrow signal intensity within normal limits. No discrete or worrisome osseous lesions. No evidence for osteomyelitis discitis or septic arthritis. Cord: Signal intensity within the thoracic spinal cord is normal. Normal cord caliber and morphology. No epidural collections. Paraspinal and other soft tissues: Paraspinous soft tissues demonstrate no acute finding. Small layering right pleural effusion with associated atelectasis. Visualized visceral structures grossly within normal limits. Disc levels: T1-2: Bilateral facet hypertrophy, greater on the right. No significant stenosis. T2-3: Grade 1 anterolisthesis. Bilateral facet hypertrophy. No spinal stenosis. Mild right foraminal narrowing. T3-4:  Negative interspace.  Mild facet hypertrophy.  No stenosis. T4-5: Negative interspace. Right greater than left facet hypertrophy. No significant stenosis. T5-6:  Negative interspace.  Mild facet hypertrophy.  No stenosis. T6-7: Negative interspace. Mild facet and ligament flavum hypertrophy. No significant stenosis. T7-8:  Negative interspace.  Mild facet hypertrophy.  No stenosis. T8-9:  Mild facet hypertrophy.  No stenosis. T9-10: Bilateral facet hypertrophy with ankylosis. No significant stenosis. T10-11:  Posterior element hypertrophy.  No stenosis. T11-12:  Bilateral facet hypertrophy.  No stenosis. T12-L1:  Negative. MRI LUMBAR SPINE FINDINGS Segmentation: Transitional lumbosacral anatomy with partial  sacralization of the L5 vertebral body. L5-S1 disc somewhat rudimentary. Alignment: Trace 2 mm anterolisthesis of L3 on L4. Mild levoscoliosis. Alignment otherwise normal with preservation of the normal lumbar lordosis. Vertebrae: Vertebral body heights maintained without evidence for acute or chronic fracture. Bone marrow signal intensity mildly heterogeneous but within  normal limits. No worrisome osseous lesions. Minimal reactive marrow edema about the bilateral facets at L2-3 through L4-5, likely due to facet arthritis, greater on the right. No evidence for osteomyelitis or discitis. Partially visualized SI joints within normal limits. Conus medullaris and cauda equina: Conus extends to the L1 level. Conus and cauda equina appear normal. Paraspinal and other soft tissues: Mild edema seen involving the right greater than left lower posterior paraspinous musculature (series 27, image 5), likely reactive in nature due to adjacent facet degeneration. Possible muscular strain and/or injury or acute myositis could also be considered. No loculated collections. Paraspinous soft tissues demonstrate no other acute finding. Approximate 1 cm right renal cyst noted. Atherosclerotic change noted within the intra-abdominal aorta. Visualized visceral structures otherwise unremarkable. Disc levels: L1-2:  Unremarkable. L2-3: Mild disc bulge with disc desiccation. Moderate right with mild left facet hypertrophy. Superimposed 14 mm synovial cyst noted at the lateral aspect of the right L2-3 facet (series 29, image 10). This exhibits no mass effect on the adjacent thecal sac. Thecal sac. Resultant mild canal with right lateral recess narrowing. Mild right L2 foraminal stenosis. L3-4: Trace anterolisthesis. Mild disc bulge with disc desiccation. Moderate bilateral facet hypertrophy. Resultant mild canal with bilateral lateral recess stenosis, slightly worse on the right. Mild bilateral foraminal narrowing. L4-5: Disc desiccation with minimal annular bulge. Moderate facet hypertrophy. No significant stenosis. L5-S1: Transitional lumbosacral anatomy with rudimentary L5-S1 disc. Minimal endplate osteophytic spurring without significant disc bulge. Mild facet hypertrophy. No significant stenosis. IMPRESSION: MR THORACIC SPINE IMPRESSION 1. No evidence for acute infection or other abnormality within the  thoracic spine. 2. Moderate thoracic dextroscoliosis with associated multilevel facet hypertrophy as above. No significant spinal stenosis. Mild right foraminal narrowing at T2-3. 3. Small layering right pleural effusion with associated atelectasis. MR LUMBAR SPINE IMPRESSION 1. Mild paraspinous edema involving the right greater than left lower posterior paraspinous musculature, favored to be reactive in nature due to adjacent facet degeneration. Finding could result in right-sided lower back pain. Sequelae of muscular injury and/or strain or possibly myositis could also be considered. No discrete soft tissue collections. 2. No other evidence for acute infection within the lumbar spine. No evidence for osteomyelitis or discitis. No epidural collections. 3. Mild for age multilevel disc bulging with moderate facet hypertrophy at L2-3 through L4-5 as above. Resultant mild canal with bilateral lateral recess narrowing at L2-3 and L3-4. Electronically Signed   By: Jeannine Boga M.D.   On: 10/23/2018 19:23   Dg Chest Portable 1 View  Result Date: 10/22/2018 CLINICAL DATA:  Dyspnea and shortness of breath EXAM: PORTABLE CHEST 1 VIEW COMPARISON:  08/13/2018 FINDINGS: Cardiac shadow is enlarged but accentuated by the portable technique. Pacing device is noted. No focal infiltrate or sizable effusion is seen. No acute bony abnormality is noted. IMPRESSION: No acute abnormality noted. Electronically Signed   By: Inez Catalina M.D.   On: 10/22/2018 11:48    Procedures Procedures (including critical care time)  CRITICAL CARE Performed by: Virgel Manifold Total critical care time: 35 minutes Critical care time was exclusive of separately billable procedures and treating other patients. Critical care was necessary to treat or prevent imminent or life-threatening deterioration. Critical  care was time spent personally by me on the following activities: development of treatment plan with patient and/or surrogate as  well as nursing, discussions with consultants, evaluation of patient's response to treatment, examination of patient, obtaining history from patient or surrogate, ordering and performing treatments and interventions, ordering and review of laboratory studies, ordering and review of radiographic studies, pulse oximetry and re-evaluation of patient's condition.   Medications Ordered in ED Medications  ceFEPIme (MAXIPIME) 2 g in sodium chloride 0.9 % 100 mL IVPB (0 g Intravenous Stopped 10/22/18 1205)  vancomycin (VANCOCIN) IVPB 1000 mg/200 mL premix (0 mg Intravenous Stopped 10/22/18 1240)  acetaminophen (TYLENOL) tablet 1,000 mg (1,000 mg Oral Given 10/22/18 1126)  sodium chloride 0.9 % bolus 1,000 mL (0 mLs Intravenous Stopped 10/22/18 1240)  fentaNYL (SUBLIMAZE) injection 75 mcg (75 mcg Intravenous Given 10/22/18 1126)  sodium chloride 0.9 % bolus 1,000 mL (0 mLs Intravenous Stopped 10/22/18 1315)     Initial Impression / Assessment and Plan / ED Course  I have reviewed the triage vital signs and the nursing notes.  Pertinent labs & imaging results that were available during my care of the patient were reviewed by me and considered in my medical decision making (see chart for details).   78yM with fever and hypoxemia. Suspect may have have pneumonia although not noted on CXR. COVID negative. Will CT. Received empiric cefepime/vanc. Will need admitted because of oxygen requirement. Troponin mildly elevated but suspect this may be demand ischemia. Known CAD and o2 sats in high 80%s on arrival to the ED.  UMAIR ROSILES was evaluated in Emergency Department on 10/23/2018 for the symptoms described in the history of present illness. He was evaluated in the context of the global COVID-19 pandemic, which necessitated consideration that the patient might be at risk for infection with the SARS-CoV-2 virus that causes COVID-19. Institutional protocols and algorithms that pertain to the evaluation of patients  at risk for COVID-19 are in a state of rapid change based on information released by regulatory bodies including the CDC and federal and state organizations. These policies and algorithms were followed during the patient's care in the ED.   Final Clinical Impressions(s) / ED Diagnoses   Final diagnoses:  Acute febrile illness  Sepsis with acute organ dysfunction without septic shock, due to unspecified organism, unspecified type [facility])    ED Discharge Orders    None       Virgel Manifold, MD 10/23/18 1940

## 2018-10-22 NOTE — ED Notes (Signed)
Got patient into a gown on the monitor did ekg shown to Dr Wilson Singer patient is resting with call bell in reach

## 2018-10-22 NOTE — H&P (Addendum)
History and Physical    DOA: 10/22/2018  PCP: [name], MD  Patient coming from: home  Chief Complaint: Dyspnea, cough  HPI: [name] Glass is a 78 y.o. male with history h/o hyperlipidemia, hypertension, CAD status post PTCA x 6, with last stent in January, GERD, arthritis, hiatal hernia, sleep apnea presents to the ED today with complaints of productive cough and dyspnea.  Patient states he has had on and off symptoms of bronchitis and received at least 2 courses of antibiotics within the last 3 months.  On presentation to the ED he was noted to be febrile, tachycardic, dyspneic and hypoxic requiring 2 L of nasal cannula.  Labs revealed elevated lactate at 4, sepsis protocol was initiated.  Patient received IV fluids broad-spectrum antibiotics and underwent COVID-19 work-up as well as CT chest to rule out PE/pneumonia which were all unremarkable.  Patient lives with his wife and denies any sick contacts.  Skin redness noted along his trunk and face which he relates to sunburn.  He denies any chest pain or leg swellings or headache or dizziness.  He does report low back pain and points to right iliac/paraspinal  area.  He does have a history of kidney stones but does not feel this pain is like prior episodes and denies any dysuria/hematuria.  He drinks scotch daily but denies any history of alcohol withdrawal.   Patient does have long history of CAD and had 5 stents placed more than 10 years back by Dr [name]. He reports undergoing a stress test in [place] at one point which was apparently good.He was in [facility] and had an episode of syncope. He ultimately was transported by EMS to [facility] where he underwent cardiac catheterization for NSTEMI revealing high-grade calcified LAD stenosis requiring DES implantation in Jan 2020.  He has remained on aspirin and Brilinta since then. He also was noted to have long pauses on telemetry with recurrent syncope  during that hospitalization and and underwent permanent pacemaker implantation.  When seen by Dr. Alvester Chou for follow-up on January 28, patient still had pocket staples in.  He currently has tenderness at pacemaker site as well as a dry whitish scab (which was also noted at follow-up visit with cardiologist in March).  Patient denies noticing any discharge.He did receive a course of abx (doxycycline) in January for cellulitis (apparently had rt forearm cellulitis, rt knee injury from fall at [facility]) and Omnicef in March (for pharyngitis/bronchitis)  Patient is requested to be admitted for further evaluation and management of sepsis.   Review of Systems: As per HPI otherwise 10 point review of systems negative.    Past Medical History:  Diagnosis Date  . Arthritis   . Coronary artery disease   . GERD (gastroesophageal reflux disease)   . H/O bronchitis   . H/O hiatal hernia   . History of kidney stones last in 1976  . Hyperlipidemia    statin intolerant, under control  . Hypertension    under control  . Hypothyroidism   . Sleep apnea    hx of, surgery to reverse    Past Surgical History:  Procedure Laterality Date  . CARDIAC CATHETERIZATION Right 2008   5 heart stents  . CATARACT EXTRACTION Bilateral 6 years ago  . CYSTOSCOPY N/A 08/06/2012   Procedure: CYSTOSCOPY;  Surgeon: [name], MD;  Location: [facility];  Service: Urology;  Laterality: N/A;  . ESOPHAGOGASTRIC FUNDOPLICATION  5465   spleen removed, 5  pints of blood given  . HERNIA REPAIR  1985   x4  . INGUINAL HERNIA REPAIR  1992  . PROSTATE ABLATION  2013   "laser ablation"  . TONSILLECTOMY  as child  . TOTAL KNEE ARTHROPLASTY Right 08/03/2014   Procedure: TOTAL RIGHT KNEE ARTHROPLASTY;  Surgeon: [name], MD;  Location: [facility];  Service: Orthopedics;  Laterality: Right;  . TRANSURETHRAL RESECTION OF PROSTATE N/A 08/06/2012   Procedure: Almyra Free OF PROSTATE WITH GYRUS ;  Surgeon: [name], MD;  Location:  [facility];  Service: Urology;  Laterality: N/A;  . UMBILICAL HERNIA REPAIR  1990  . UVULECTOMY  2005   for sleep apnea  . UVULOPALATOPHARYNGOPLASTY  6 years ago    Social history:  reports that he has never smoked. He has never used smokeless tobacco. He reports current alcohol use of about 4.0 standard drinks of alcohol per week. He reports that he does not use drugs.Drinks scotch 2-3 drinks/day.    Allergies  Allergen Reactions  . Morphine And Related     Had "crazy dreams" felt "crazy" per pt.  . Bactrim [Sulfamethoxazole-Trimethoprim]   . Statins     Family History  Problem Relation Age of Onset  . Heart attack Father 35       lived to 72  . Alzheimer's disease Mother   . Stroke Mother   . Breast cancer Mother   . Alzheimer's disease Maternal Grandmother   . Stroke Maternal Grandmother   . Lung cancer Maternal Grandmother   . Lung cancer Maternal Grandfather   . Cancer Paternal Grandmother        type unknown  . Diabetes Paternal Grandfather   . Other Paternal Grandfather        Growth in the back of the head      Prior to Admission medications   Medication Sig Start Date End Date Taking? Authorizing Provider  acetaminophen (TYLENOL) 650 MG CR tablet Take 1,300 mg by mouth at bedtime. Reported on 12/16/2015   Yes [provider]  acidophilus (RISAQUAD) CAPS capsule Take 1 capsule by mouth 2 (two) times daily.   Yes [provider]  anastrozole (ARIMIDEX) 1 MG tablet Take 1 mg by mouth 2 (two) times a week.  10/06/15  Yes [provider]  aspirin EC 81 MG tablet Take 1 tablet (81 mg total) by mouth daily. 07/30/17  Yes [name], MD  BRILINTA 90 MG TABS tablet Take 90 mg by mouth 2 (two) times daily.  06/23/18  Yes [provider]  DEXILANT 60 MG capsule Take 1 capsule by mouth daily. 11/29/15  Yes [provider]  ezetimibe (ZETIA) 10 MG tablet TAKE 1 TABLET BY MOUTH EVERY DAY Patient taking differently: Take 10 mg by  mouth daily.  05/17/18  Yes [name], MD  fluticasone (FLONASE) 50 MCG/ACT nasal spray USE DAILY AS DIRECTED Patient taking differently: Place 2 sprays into both nostrils daily.  03/03/18  Yes [name], MD  liothyronine (CYTOMEL) 25 MCG tablet Take 25 mcg by mouth every morning.  05/07/17  Yes [provider]  Nutritional Supplements (DHEA PO) Take 25 mg by mouth.   Yes [provider]  potassium gluconate 595 MG TABS Take 595 mg by mouth every evening.   Yes [provider]  tamsulosin (FLOMAX) 0.4 MG CAPS capsule Take 0.4 mg by mouth daily after breakfast.  02/05/18  Yes [provider]  thyroid (ARMOUR) 60 MG tablet Take 60-120  mg by mouth 2 (two) times daily.    Yes [provider]  acyclovir (ZOVIRAX) 200 MG capsule TAKE 1 CAPSULE (200 MG TOTAL) BY MOUTH 3 (THREE) TIMES DAILY. Patient not taking: Reported on 10/22/2018 08/29/18   [name], MD  cefdinir (OMNICEF) 300 MG capsule TAKE 1 CAPSULE BY MOUTH TWICE A DAY FOR 10 DAYS Patient not taking: Reported on 10/22/2018 09/03/18   [name], MD  Pitavastatin Calcium (LIVALO) 4 MG TABS Take 1 tablet (4 mg total) by mouth daily. Patient not taking: Reported on 10/22/2018 06/27/18   [name], MD    Physical Exam: Vitals:   10/22/18 1830 10/22/18 1845 10/22/18 1900 10/22/18 1931  BP: 94/60 105/90 101/69 114/75  Pulse: 64 90 85 61  Resp: (!) 30 (!) 30 (!) 32 16  Temp:      TempSrc:      SpO2: 96% 92% 95% 94%  Weight:      Height:        Constitutional: NAD, calm, comfortable Eyes: PERRL, lids and conjunctivae normal ENMT: Mucous membranes are moist. Posterior pharynx clear of any exudate or lesions.Normal dentition.  Neck: normal, supple, no masses, no thyromegaly Respiratory: clear to auscultation bilaterally, no wheezing, no crackles. Normal respiratory effort. No accessory muscle use.  Cardiovascular: Regular rate and rhythm, 2+ systolic murmur /no rubs / gallops.  No extremity edema. 2+ pedal pulses. No carotid bruits.  Somewhat tender at pacemaker site with whitish scab/scar, no fluctuance or focal swelling noted. Abdomen: no abdominal or CVA tenderness, no masses palpated. No hepatosplenomegaly. Bowel sounds positive.  Musculoskeletal: no clubbing / cyanosis. No joint deformity upper and lower extremities. Good ROM, no contractures. Normal muscle tone.  Neurologic: CN 2-12 grossly intact. Sensation intact, DTR normal. Strength 5/5 in all 4.  Psychiatric: Appears anxious. Alert and oriented x 3.   SKIN/catheters: Flushed appearance/sunburn along trunk/upper extremities and face  Labs on Admission: I have personally reviewed following labs and imaging studies  CBC: Recent Labs  Lab 10/22/18 1050  WBC 5.0  NEUTROABS 3.7  HGB 18.2*  HCT 54.3*  MCV 93.1  PLT 109   Basic Metabolic Panel: Recent Labs  Lab 10/22/18 1050  NA 141  K 4.4  CL 108  CO2 17*  GLUCOSE 162*  BUN 13  CREATININE 1.24  CALCIUM 10.3   GFR: Estimated Creatinine Clearance: 53.9 mL/min (by C-G formula based on SCr of 1.24 mg/dL). Liver Function Tests: Recent Labs  Lab 10/22/18 1050  AST 43*  ALT 25  ALKPHOS 66  BILITOT 1.8*  PROT 7.1  ALBUMIN 3.7   No results for input(s): LIPASE, AMYLASE in the last 168 hours. No results for input(s): AMMONIA in the last 168 hours. Coagulation Profile: No results for input(s): INR, PROTIME in the last 168 hours. Cardiac Enzymes: Recent Labs  Lab 10/22/18 1050  TROPONINI 0.07*   BNP (last 3 results) No results for input(s): PROBNP in the last 8760 hours. HbA1C: No results for input(s): HGBA1C in the last 72 hours. CBG: No results for input(s): GLUCAP in the last 168 hours. Lipid Profile: No results for input(s): CHOL, HDL, LDLCALC, TRIG, CHOLHDL, LDLDIRECT in the last 72 hours. Thyroid Function Tests: No results for input(s): TSH, T4TOTAL, FREET4, T3FREE, THYROIDAB in the last 72 hours. Anemia Panel: No results for  input(s): VITAMINB12, FOLATE, FERRITIN, TIBC, IRON, RETICCTPCT in the last 72 hours. Urine analysis:    Component Value Date/Time   COLORURINE YELLOW 10/22/2018 1122   APPEARANCEUR  HAZY (A) 10/22/2018 1122   LABSPEC 1.010 10/22/2018 1122   PHURINE 5.0 10/22/2018 1122   GLUCOSEU NEGATIVE 10/22/2018 [place] 06/22/2017 1116   HGBUR SMALL (A) 10/22/2018 1122   BILIRUBINUR NEGATIVE 10/22/2018 1122   KETONESUR NEGATIVE 10/22/2018 1122   PROTEINUR 100 (A) 10/22/2018 1122   UROBILINOGEN 0.2 06/22/2017 1116   NITRITE NEGATIVE 10/22/2018 1122   LEUKOCYTESUR NEGATIVE 10/22/2018 1122    Radiological Exams on Admission: Ct Angio Chest Pe W And/or Wo Contrast  Result Date: 10/22/2018 CLINICAL DATA:  Shortness of breath, chest tightness EXAM: CT ANGIOGRAPHY CHEST WITH CONTRAST TECHNIQUE: Multidetector CT imaging of the chest was performed using the standard protocol during bolus administration of intravenous contrast. Multiplanar CT image reconstructions and MIPs were obtained to evaluate the vascular anatomy. CONTRAST:  165mL OMNIPAQUE IOHEXOL 350 MG/ML SOLN COMPARISON:  None. FINDINGS: Cardiovascular: Satisfactory opacification of the pulmonary arteries to the segmental level. No evidence of pulmonary embolism. Cardiomegaly. Extensive 3 vessel coronary artery calcifications and/or stents. Left chest multi lead pacer. No pericardial effusion. Mediastinum/Nodes: No enlarged mediastinal, hilar, or axillary lymph nodes. Thyroid gland, trachea, and esophagus demonstrate no significant findings. Lungs/Pleura: Bibasilar scarring or atelectasis. No pleural effusion or pneumothorax. Upper Abdomen: No acute abnormality. Postoperative findings of splenectomy. Musculoskeletal: No chest wall abnormality. No acute or significant osseous findings. Review of the MIP images confirms the above findings. IMPRESSION: 1.  Negative examination for pulmonary embolism. 2.  Bibasilar scarring or atelectasis. 3.   Cardiomegaly and coronary artery disease. Electronically Signed   By: Eddie Candle M.D.   On: 10/22/2018 16:14   Dg Chest Portable 1 View  Result Date: 10/22/2018 CLINICAL DATA:  Dyspnea and shortness of breath EXAM: PORTABLE CHEST 1 VIEW COMPARISON:  08/13/2018 FINDINGS: Cardiac shadow is enlarged but accentuated by the portable technique. Pacing device is noted. No focal infiltrate or sizable effusion is seen. No acute bony abnormality is noted. IMPRESSION: No acute abnormality noted. Electronically Signed   By: Inez Catalina M.D.   On: 10/22/2018 11:48    EKG: Independently reviewed.  Sinus rhythm with right bundle branch block   Assessment and Plan:   1.  Sepsis syndrome with acute bronchitis/ presumptive respiratory illness and acute hypoxia present on admission: Saturating well on 2lits now. Patient hypoxic (89% on RA), febrile,tachycardic, tachypneic,hypotensive elevated lactate on presentation meeting sepsis criterion but with unclear etiology.  CT chest negative for pneumonia but reported atelectasis and post splenectomy status.  Given ongoing global pandemic and patient's clinical presentation COVID-19 suspected in spite of negative PCR on presentation.  ED physician discussed with infectious diseases who recommended repeat COVID and if negative they felt coronavirus infection would be unlikely.  Given chronicity of symptoms and recent cardiac intervention/pacemaker implantation with tenderness at pacemaker site, bacterial endocarditis on the differential.  He does have a systolic murmur on exam.  Ordered echo and requested [facility] inpatient evaluation (discussed with fellow on-call).  Will request infectious diseases to follow-up in a.m. patient received 1 dose of cefepime and vancomycin in the ED.  Blood cultures sent.  Will continue cefepime and IV hydration per sepsis protocol.  Although influenza/respiratory viral panel and repeat corona virus testing negative, will continue contact and  droplet precautions given clinical presentation and unclear etiology at this point.  Although patient has history of kidney stones, will defer work-up in this direction given normal UA and location of pain.  Consider CT pelvis/lumbosacral spine if endocarditis work-up negative.Taper 02 to off as tolerated.  H/o occupational chemical exposure but no fibrosis on CT.  2. CAD S/P PTCA: No c/o chest pain, no acute ST-T changes on EKG but has abnormal troponin.Resume ASA, Brilinta , Zetia, statins (patient on Pitavastatin (non formulary) at home although has statin intolerance listed. Monitor on Pravastatin which is ordered as substitute given recent stent. Cycle CE and check echo. Cardiology evaluating. Watch for fluid overload on IV fluids.  3. 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  GERD/hiatal hernia: Per chart patient is s/p gastric fundoplication and s/p splenectomy. PPI.  5. 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 Continue to monitor  7. Chronic alcohol abuse: drinks scotch daily. No signs of intoxication/withdrawal. Monitor.   DVT prophylaxis: Lovenox  Code Status: Full code  Family Communication: Discussed with patient. Health care proxy would be his wife Consults called: Cardiology [facility]. Please contact ID for formal evaluation in am.  Admission status: I certify that at the point of admission it is my clinical judgment that the patient will require inpatient hospital care spanning beyond 2 midnights from the point of admission due to high intensity of service and high frequency of surveillance required.Inpatient status is judged to be reasonable and necessary in order to provide the required intensity of service to ensure the patient's safety. The patient's presenting symptoms, physical exam findings, and initial radiographic and laboratory data in the context of their chronic comorbidities is felt to place them at high risk for further clinical deterioration. The  following factors support the patient status of inpatient : Sepsis syndrome requiring IV antibiotics   Guilford Shi MD [facility] Pager [phone]  If 7PM-7AM, please contact night-coverage [email or web address] Password [facility]  10/22/2018, 7:32 PM

## 2018-10-22 NOTE — Telephone Encounter (Signed)
Pt 's wife call stating that her husband received. a pacemaker in January.  Today he is c/o chest pain, shivering and shortness of breath.  Advised wife to call [phone]. She voiced understanding.  Routing to flow at [facility] for review.

## 2018-10-22 NOTE — Progress Notes (Signed)
PHARMACY - PHYSICIAN COMMUNICATION CRITICAL VALUE ALERT - BLOOD CULTURE IDENTIFICATION (BCID)  [name] Glass is an 78 y.o. male who presented to [facility] on 10/22/2018 with a chief complaint of SOB and productive cough.  Assessment:  BCID 4/4 strep species, concern for bronchitis/recent pacemaker implantation with tenderness at site.    Name of physician (or Provider) Contacted: [place]  Current antibiotics: Cefepime  Changes to prescribed antibiotics recommended:  Discussed with provider and will leave cefepime for now, consider narrowing to ceftriaxone 2g q 24h   No results found for this or any previous visit.  [name] Glass 10/22/2018  11:03 PM

## 2018-10-22 NOTE — Progress Notes (Signed)
Pharmacy Antibiotic Note  [name] Glass is a 78 y.o. male admitted on 10/22/2018 with sepsis and bronchitis.  Pharmacy has been consulted for cefepime dosing.  Presenting with productive cough and SOB. WBC 5, LA 3.1, influenza neg, afebrile. Scr 1.24 (CrCl 53 mL/min).   Plan: Cefepime 2g IV every 12 hours  Monitor renal fx, cx results, clinical pic  Height: 6' (182.9 cm) Weight: 195 lb (88.5 kg) IBW/kg (Calculated) : 77.6  Temp (24hrs), Avg:100.4 F (38 C), Min:98.2 F (36.8 C), Max:102.5 F (39.2 C)  Recent Labs  Lab 10/22/18 1050 10/22/18 1122 10/22/18 1315  WBC 5.0  --   --   CREATININE 1.24  --   --   LATICACIDVEN  --  4.3* 3.1*    Estimated Creatinine Clearance: 53.9 mL/min (by C-G formula based on SCr of 1.24 mg/dL).    Allergies  Allergen Reactions  . Morphine And Related     Had "crazy dreams" felt "crazy" per pt.  . Bactrim [Sulfamethoxazole-Trimethoprim]   . Statins     Antimicrobials this admission: Vanc 5/19 x1 Cefepime 5/19 >>   Dose adjustments this admission: N/A  Microbiology results: 5/19 BCx: sent 5/19 COVID: neg x2 5/19 UCx: sent  5/19 Resp PCR: neg 5/19 Sputum: sent   Thank you for allowing pharmacy to be a part of this patient's care.  Antonietta Jewel, PharmD, [place] Clinical Pharmacist  Pager: [phone] Phone: [phone] 10/22/2018 9:05 PM

## 2018-10-22 NOTE — ED Notes (Signed)
[phone] [name] Glass wants a pt update soon as possible, thanks

## 2018-10-22 NOTE — ED Notes (Signed)
Nurse Navigator communication: The wife was given an update on patient status. She was grateful and will be provided room and unit information.

## 2018-10-23 ENCOUNTER — Inpatient Hospital Stay (HOSPITAL_COMMUNITY): Payer: Medicare Other

## 2018-10-23 ENCOUNTER — Encounter (HOSPITAL_COMMUNITY): Payer: Self-pay | Admitting: Radiology

## 2018-10-23 DIAGNOSIS — G4730 Sleep apnea, unspecified: Secondary | ICD-10-CM | POA: Diagnosis present

## 2018-10-23 DIAGNOSIS — B955 Unspecified streptococcus as the cause of diseases classified elsewhere: Secondary | ICD-10-CM

## 2018-10-23 DIAGNOSIS — R7881 Bacteremia: Secondary | ICD-10-CM

## 2018-10-23 DIAGNOSIS — Z9081 Acquired absence of spleen: Secondary | ICD-10-CM

## 2018-10-23 DIAGNOSIS — Z950 Presence of cardiac pacemaker: Secondary | ICD-10-CM | POA: Diagnosis present

## 2018-10-23 DIAGNOSIS — I340 Nonrheumatic mitral (valve) insufficiency: Secondary | ICD-10-CM

## 2018-10-23 DIAGNOSIS — M545 Low back pain, unspecified: Secondary | ICD-10-CM | POA: Diagnosis present

## 2018-10-23 DIAGNOSIS — A419 Sepsis, unspecified organism: Secondary | ICD-10-CM

## 2018-10-23 DIAGNOSIS — R7989 Other specified abnormal findings of blood chemistry: Secondary | ICD-10-CM

## 2018-10-23 DIAGNOSIS — I2510 Atherosclerotic heart disease of native coronary artery without angina pectoris: Secondary | ICD-10-CM

## 2018-10-23 DIAGNOSIS — R011 Cardiac murmur, unspecified: Secondary | ICD-10-CM

## 2018-10-23 DIAGNOSIS — J42 Unspecified chronic bronchitis: Secondary | ICD-10-CM | POA: Diagnosis present

## 2018-10-23 DIAGNOSIS — Z881 Allergy status to other antibiotic agents status: Secondary | ICD-10-CM

## 2018-10-23 DIAGNOSIS — Z888 Allergy status to other drugs, medicaments and biological substances status: Secondary | ICD-10-CM

## 2018-10-23 DIAGNOSIS — Z885 Allergy status to narcotic agent status: Secondary | ICD-10-CM

## 2018-10-23 DIAGNOSIS — I351 Nonrheumatic aortic (valve) insufficiency: Secondary | ICD-10-CM

## 2018-10-23 LAB — URINE CULTURE: Culture: <10000 — AB

## 2018-10-23 LAB — BASIC METABOLIC PANEL
Anion gap: 12 (ref 5–15)
BUN: 26 mg/dL — ABNORMAL HIGH (ref 8–23)
CO2: 18 mmol/L — ABNORMAL LOW (ref 22–32)
Calcium: 9.3 mg/dL (ref 8.9–10.3)
Chloride: 111 mmol/L (ref 98–111)
Creatinine, Ser: 1.93 mg/dL — ABNORMAL HIGH (ref 0.61–1.24)
GFR calc Af Amer: 38 mL/min — ABNORMAL LOW (ref >60)
GFR calc non Af Amer: 32 mL/min — ABNORMAL LOW (ref >60)
Glucose, Bld: 81 mg/dL (ref 70–99)
Potassium: 4.3 mmol/L (ref 3.5–5.1)
Sodium: 141 mmol/L (ref 135–145)

## 2018-10-23 LAB — CBC
HCT: 46 % (ref 39.0–52.0)
Hemoglobin: 15.9 g/dL (ref 13.0–17.0)
MCH: 31.9 pg (ref 26.0–34.0)
MCHC: 34.6 g/dL (ref 30.0–36.0)
MCV: 92.2 fL (ref 80.0–100.0)
Platelets: 177 10*3/uL (ref 150–400)
RBC: 4.99 MIL/uL (ref 4.22–5.81)
RDW: 18.6 % — ABNORMAL HIGH (ref 11.5–15.5)
WBC: 22.1 10*3/uL — ABNORMAL HIGH (ref 4.0–10.5)
nRBC: 0.2 % (ref 0.0–0.2)

## 2018-10-23 LAB — LACTIC ACID, PLASMA
Lactic Acid, Venous: 2.9 mmol/L (ref 0.5–1.9)
Lactic Acid, Venous: 3.1 mmol/L (ref 0.5–1.9)
Lactic Acid, Venous: 3.1 mmol/L (ref 0.5–1.9)
Lactic Acid, Venous: 3.2 mmol/L (ref 0.5–1.9)
Lactic Acid, Venous: 3.3 mmol/L (ref 0.5–1.9)
Lactic Acid, Venous: 3.9 mmol/L (ref 0.5–1.9)
Lactic Acid, Venous: 5 mmol/L (ref 0.5–1.9)

## 2018-10-23 LAB — TROPONIN I
Troponin I: 0.27 ng/mL (ref <0.03)
Troponin I: 0.2 ng/mL (ref <0.03)

## 2018-10-23 LAB — SEDIMENTATION RATE: Sed Rate: 1 mm/hr (ref 0–16)

## 2018-10-23 LAB — ECHOCARDIOGRAM COMPLETE
Height: 72 in
Weight: 3440 oz

## 2018-10-23 MED ORDER — SODIUM CHLORIDE 0.9 % IV SOLN
2.0000 g | INTRAVENOUS | Status: DC
  Administered 2018-10-23: 12:00:00 2 g via INTRAVENOUS
  Filled 2018-10-23 (×2): qty 20

## 2018-10-23 MED ORDER — LACTATED RINGERS IV SOLN
INTRAVENOUS | Status: AC
  Administered 2018-10-23: 11:00:00 1 mL via INTRAVENOUS
  Administered 2018-10-24: 02:00:00 via INTRAVENOUS

## 2018-10-23 MED ORDER — LACTATED RINGERS IV BOLUS
1000.0000 mL | Freq: Once | INTRAVENOUS | Status: AC
  Administered 2018-10-23: 1000 mL via INTRAVENOUS

## 2018-10-23 MED ORDER — LACTATED RINGERS IV BOLUS
1000.0000 mL | Freq: Once | INTRAVENOUS | Status: AC
  Administered 2018-10-23: 02:00:00 1000 mL via INTRAVENOUS

## 2018-10-23 MED ORDER — IPRATROPIUM-ALBUTEROL 0.5-2.5 (3) MG/3ML IN SOLN
3.0000 mL | Freq: Three times a day (TID) | RESPIRATORY_TRACT | Status: DC
  Administered 2018-10-23 – 2018-10-24 (×2): 3 mL via RESPIRATORY_TRACT
  Filled 2018-10-23 (×2): qty 3

## 2018-10-23 MED ORDER — LACTATED RINGERS IV BOLUS
500.0000 mL | Freq: Once | INTRAVENOUS | Status: AC
  Administered 2018-10-23: 999 mL via INTRAVENOUS

## 2018-10-23 MED ORDER — ALBUTEROL SULFATE (2.5 MG/3ML) 0.083% IN NEBU: 2.5000 mg | INHALATION_SOLUTION | RESPIRATORY_TRACT | Status: DC | PRN

## 2018-10-23 MED ORDER — HYDROMORPHONE HCL 1 MG/ML IJ SOLN
0.5000 mg | INTRAMUSCULAR | Status: DC | PRN
  Administered 2018-10-23 – 2018-10-31 (×5): 0.5 mg via INTRAVENOUS
  Filled 2018-10-23 (×5): qty 0.5

## 2018-10-23 MED ORDER — OXYCODONE HCL 5 MG PO TABS
5.0000 mg | ORAL_TABLET | ORAL | Status: DC | PRN
  Administered 2018-10-23 – 2018-10-31 (×7): 5 mg via ORAL
  Filled 2018-10-23 (×7): qty 1

## 2018-10-23 MED ORDER — ACETAMINOPHEN 325 MG PO TABS: 650.0000 mg | ORAL_TABLET | Freq: Four times a day (QID) | ORAL | Status: DC | PRN

## 2018-10-23 NOTE — Progress Notes (Signed)
Pt admitted with sepsis. He was seen earlier today by our on call team. He has CAD with last PCI in January 2020 and has a pacemaker in place. Agree that an echo is indicated today. If no source of infection is found, may need TEE later this week. Troponin elevation likely demand ischemia in setting of sepsis. Will follow up later today after echo results are available.   Lauree Chandler 10/23/2018 7:42 AM

## 2018-10-23 NOTE — Progress Notes (Addendum)
PROGRESS NOTE    [name] Glass  BDZ:[id number] DOB: February 18, 1941 DOA: 10/22/2018 PCP: [name], MD  Brief Narrative:  [name] Glass is [name] Glass 78 y.o. male with history h/o hyperlipidemia, hypertension, CAD status post PTCA x 6, with last stent in January, GERD, arthritis, hiatal hernia, sleep apnea presents to the ED today with complaints of productive cough and dyspnea.  Patient states he has had on and off symptoms of bronchitis and received at least 2 courses of antibiotics within the last 3 months.  On presentation to the ED he was noted to be febrile, tachycardic, dyspneic and hypoxic requiring 2 L of nasal cannula.  Labs revealed elevated lactate at 4, sepsis protocol was initiated.  Patient received IV fluids broad-spectrum antibiotics and underwent COVID-19 work-up as well as CT chest to rule out PE/pneumonia which were all unremarkable.  Patient lives with his wife and denies any sick contacts.  Skin redness noted along his trunk and face which he relates to sunburn.  He denies any chest pain or leg swellings or headache or dizziness.  He does report low back pain and points to right iliac/paraspinal  area.  He does have [name] Glass history of kidney stones but does not feel this pain is like prior episodes and denies any dysuria/hematuria.  He drinks scotch daily but denies any history of alcohol withdrawal.   Patient does have long history of CAD and had 5 stents placed more than 10 years back by Dr [name]. He reports undergoing [name] Glass stress test in [place] at one point which was apparently good.He was in [facility] and had an episode of syncope.He ultimately was transported by EMS to [facility] where he underwent cardiac catheterization for NSTEMI revealing high-grade calcified LAD stenosis requiring DES implantation in Jan 2020.  He has remained on aspirin and Brilinta since then.He also was noted to have long pauses on telemetry with recurrent syncope during that  hospitalization and and underwent permanent pacemaker implantation.  When seen by Dr. Alvester Chou for follow-up on January 28, patient still had pocket staples in.  He currently has tenderness at pacemaker site as well as [name] Glass dry whitish scab (which was also noted at follow-up visit with cardiologist in March).  Patient denies noticing any discharge.He did receive Madellyn Denio course of abx (doxycycline) in January for cellulitis (apparently had rt forearm cellulitis, rt knee injury from fall at [place] [facility]) and Omnicef in March (for pharyngitis/bronchitis)  Assessment & Plan:   Principal Problem:   Streptococcal bacteremia Active Problems:   Coronary artery disease   OA (osteoarthritis) of knee   Gastroesophageal reflux disease without esophagitis   Essential hypertension, benign   Hyperlipidemia with target LDL less than 70   Chronic renal disease, stage 2, mildly decreased glomerular filtration rate (GFR) between 60-89 mL/min/1.73 square meter   Sepsis ([place])   H/O splenectomy   Presence of permanent cardiac pacemaker   Sleep apnea   Acute low back pain   Chronic bronchitis ([place])  1.  Sepsis syndrome secondary to Strep Bacteremia: - 2/2 blood cx from 5/19 with gram positive cocci -> strep on BCID - repeat blood cx 5/20 pending - narrowed to ceftriaxone - notably, pt with hx of splenectomy  - urine cx <10,000 CFU - Sars Cov 2 testing negative x 2, negative influenza - Pt with tenderness at pacemaker pocket, concern for infection - Pt also has TTP of spine -> follow up MRI T and L spine  - follow TTE -  ID consult, appreciate recommendations - Appreciate cards recommendations as well, pending TTE, pt may need TEE - Continue to trend lactate to normal  2. Acute Hypoxic Respiratory Failure - CT PE protocol negative for PE - initially concern for bronchitis/respiratory viral illness, but suspect sepsis due to his strep bacteremia above - currently on 4 L by [place] - does not appear overloaded, normal  BNP - He describes chronic SOB since starting brilinta in January, will need to discuss with cards - wean O2 as able  - I/O, daily weights  3. CAD S/P PTCA   Elevated Troponin: No c/o chest pain, no acute ST-T changes on EKG but has abnormal troponin. Resume ASA, Brilinta , Zetia, statins (patient on Pitavastatin (non formulary) at home although has statin intolerance listed. Monitor on Pravastatin which is ordered as substitute given recent stent. Cycle CE (troponins elevated, but now downtrending, relatively low, suspect this is 2/2 demand ischemia due to above) and check echo (pending). Cardiology evaluating, appreciate recs.  3. 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  GERD/hiatal hernia: Per chart patient is s/p gastric fundoplication and s/p splenectomy. PPI.  5. SSS : S/P pacemaker  6. 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  Slowly improving.  Follow with IVF.  Consider renal US if not improving. UA with 100 mg/dl protein, 0-5 RBC.    7. Chronic alcohol abuse: drinks scotch daily. No signs of intoxication/withdrawal. Monitor.   DVT prophylaxis: lovenox Code Status: full  Family Communication: none at bedsdie Disposition Plan: pending further w/u and improvement.  Strep bacteremia with unknown source.  Requires continued ID eval, IV antibiotics, echo, MRI spine which are currently pending.  Continues to require inpatient treatment.   Consultants:   ID  Cardiology  Procedures: None  Antimicrobials:  Anti-infectives (From admission, onward)   Start     Dose/Rate Route Frequency Ordered Stop   10/23/18 1200  cefTRIAXone (ROCEPHIN) 2 g in sodium chloride 0.9 % 100 mL IVPB     2 g 200 mL/hr over 30 Minutes Intravenous Every 24 hours 10/23/18 0902     10/23/18 1000  ceFEPIme (MAXIPIME) 1 g in sodium chloride 0.9 % 100 mL IVPB  Status:  Discontinued     1 g 200 mL/hr over 30 Minutes Intravenous Every 12 hours 10/22/18 2000 10/22/18 2104    10/22/18 2300  ceFEPIme (MAXIPIME) 2 g in sodium chloride 0.9 % 100 mL IVPB  Status:  Discontinued     2 g 200 mL/hr over 30 Minutes Intravenous Every 12 hours 10/22/18 2104 10/23/18 0901   10/22/18 1100  ceFEPIme (MAXIPIME) 2 g in sodium chloride 0.9 % 100 mL IVPB     2 g 200 mL/hr over 30 Minutes Intravenous  Once 10/22/18 1050 10/22/18 1205   10/22/18 1100  vancomycin (VANCOCIN) IVPB 1000 mg/200 mL premix     1,000 mg 200 mL/hr over 60 Minutes Intravenous  Once 10/22/18 1050 10/22/18 1240     Subjective: C/o sob for months - since brilinta Presented due to chills, malaise TTP over pacemaker pocket for weeks.  Objective: Vitals:   10/23/18 0740 10/23/18 0913 10/23/18 1114 10/23/18 1118  BP: 119/73  (!) 108/56   Pulse: 64  74 66  Resp: (!) 21  20   Temp: 98.3 F (36.8 C)  97.9 F (36.6 C)   TempSrc: Oral  Oral   SpO2: 95% 95% (!) 87% 97%  Weight:  Height:        Intake/Output Summary (Last 24 hours) at 10/23/2018 1449 Last data filed at 10/23/2018 1117 Gross per 24 hour  Intake 2143.86 ml  Output 775 ml  Net 1368.86 ml   Filed Weights   10/22/18 1022 10/22/18 2229 10/23/18 0518  Weight: 88.5 kg 96.2 kg 97.5 kg    Examination:  General exam: Appears calm and comfortable  Respiratory system: Clear to auscultation. Respiratory effort normal. Cardiovascular system: S1 & S2 heard, RRR. Erythema over chest, he notes due to sunburn.  Pacemaker pocket without clear TTP or obvious signs of infection. Gastrointestinal system: Abdomen is nondistended, soft and nontender. Central nervous system: Alert and oriented. No focal neurological deficits. Extremities: no lee.  TTP over thoracic spine. Skin: No rashes, lesions or ulcers Psychiatry: Judgement and insight appear normal. Mood & affect appropriate.     Data Reviewed: I have personally reviewed following labs and imaging studies  CBC: Recent Labs  Lab 10/22/18 1050 10/22/18 2304 10/23/18 0206  WBC 5.0 18.5*  22.1*  NEUTROABS 3.7  --   --   HGB 18.2* 16.8 15.9  HCT 54.3* 49.1 46.0  MCV 93.1 91.9 92.2  PLT 261 199 614   Basic Metabolic Panel: Recent Labs  Lab 10/22/18 1050 10/22/18 2015 10/23/18 0206  NA 141  --  141  K 4.4  --  4.3  CL 108  --  111  CO2 17*  --  18*  GLUCOSE 162*  --  81  BUN 13  --  26*  CREATININE 1.24 1.97* 1.93*  CALCIUM 10.3  --  9.3   GFR: Estimated Creatinine Clearance: 38.2 mL/min (Lulamae Skorupski) (by C-G formula based on SCr of 1.93 mg/dL (H)). Liver Function Tests: Recent Labs  Lab 10/22/18 1050  AST 43*  ALT 25  ALKPHOS 66  BILITOT 1.8*  PROT 7.1  ALBUMIN 3.7   No results for input(s): LIPASE, AMYLASE in the last 168 hours. No results for input(s): AMMONIA in the last 168 hours. Coagulation Profile: No results for input(s): INR, PROTIME in the last 168 hours. Cardiac Enzymes: Recent Labs  Lab 10/22/18 1050 10/22/18 2015 10/23/18 0206 10/23/18 0730  TROPONINI 0.07* 0.26* 0.27* 0.20*   BNP (last 3 results) No results for input(s): PROBNP in the last 8760 hours. HbA1C: No results for input(s): HGBA1C in the last 72 hours. CBG: No results for input(s): GLUCAP in the last 168 hours. Lipid Profile: No results for input(s): CHOL, HDL, LDLCALC, TRIG, CHOLHDL, LDLDIRECT in the last 72 hours. Thyroid Function Tests: No results for input(s): TSH, T4TOTAL, FREET4, T3FREE, THYROIDAB in the last 72 hours. Anemia Panel: No results for input(s): VITAMINB12, FOLATE, FERRITIN, TIBC, IRON, RETICCTPCT in the last 72 hours. Sepsis Labs: Recent Labs  Lab 10/23/18 0206 10/23/18 0751 10/23/18 1019 10/23/18 1331  LATICACIDVEN 3.9* 3.1* 3.3* 2.9*    Recent Results (from the past 240 hour(s))  SARS Coronavirus 2 (CEPHEID- Performed in [place] hospital lab), Hosp Order     Status: None   Collection Time: 10/22/18 10:47 AM  Result Value Ref Range Status   SARS Coronavirus 2 NEGATIVE NEGATIVE Final    Comment: (NOTE) If result is NEGATIVE SARS-CoV-2 target  nucleic acids are NOT DETECTED. The SARS-CoV-2 RNA is generally detectable in upper and lower  respiratory specimens during the acute phase of infection. The lowest  concentration of SARS-CoV-2 viral copies this assay can detect is 250  copies / mL. Samadhi Mahurin negative result does not preclude SARS-CoV-2 infection  and should not be used as the sole basis for treatment or other  patient management decisions.  Kameria Canizares negative result may occur with  improper specimen collection / handling, submission of specimen other  than nasopharyngeal swab, presence of viral mutation(s) within the  areas targeted by this assay, and inadequate number of viral copies  (<250 copies / mL). Amalia Edgecombe negative result must be combined with clinical  observations, patient history, and epidemiological information. If result is POSITIVE SARS-CoV-2 target nucleic acids are DETECTED. The SARS-CoV-2 RNA is generally detectable in upper and lower  respiratory specimens dur ing the acute phase of infection.  Positive  results are indicative of active infection with SARS-CoV-2.  Clinical  correlation with patient history and other diagnostic information is  necessary to determine patient infection status.  Positive results do  not rule out bacterial infection or co-infection with other viruses. If result is PRESUMPTIVE POSTIVE SARS-CoV-2 nucleic acids MAY BE PRESENT.   Alliyah Roesler presumptive positive result was obtained on the submitted specimen  and confirmed on repeat testing.  While 2019 novel coronavirus  (SARS-CoV-2) nucleic acids may be present in the submitted sample  additional confirmatory testing may be necessary for epidemiological  and / or clinical management purposes  to differentiate between  SARS-CoV-2 and other Sarbecovirus currently known to infect humans.  If clinically indicated additional testing with an alternate test  methodology [phone]) is advised. The SARS-CoV-2 RNA is generally  detectable in upper and lower  respiratory sp ecimens during the acute  phase of infection. The expected result is Negative. Fact Sheet for Patients:  StrictlyIdeas.no Fact Sheet for Healthcare Providers: BankingDealers.co.za This test is not yet approved or cleared by the [place] FDA and has been authorized for detection and/or diagnosis of SARS-CoV-2 by FDA under an Emergency Use Authorization (EUA).  This EUA will remain in effect (meaning this test can be used) for the duration of the COVID-19 declaration under Section 564(b)(1) of the Act, 21 U.S.C. section 360bbb-3(b)(1), unless the authorization is terminated or revoked sooner. Performed at [facility], [place] [address] [place], [place] [address]   Blood culture (routine x 2)     Status: None (Preliminary result)   Collection Time: 10/22/18 10:52 AM  Result Value Ref Range Status   Specimen Description BLOOD BLOOD LEFT FOREARM  Final   Special Requests   Final    BOTTLES DRAWN AEROBIC AND ANAEROBIC Blood Culture adequate volume   Culture  Setup Time   Final    IN BOTH AEROBIC AND ANAEROBIC BOTTLES GRAM POSITIVE COCCI CRITICAL RESULT CALLED TO, READ BACK BY AND VERIFIED WITH: Derry Lory [facility] 10/22/18 2300 JDW Performed at [facility], [address], [place], [place] [address]    Culture GRAM POSITIVE COCCI  Final   Report Status PENDING  Incomplete  Blood Culture ID Panel (Reflexed)     Status: Abnormal   Collection Time: 10/22/18 10:52 AM  Result Value Ref Range Status   Enterococcus species NOT DETECTED NOT DETECTED Final   Listeria monocytogenes NOT DETECTED NOT DETECTED Final   Staphylococcus species NOT DETECTED NOT DETECTED Final   Staphylococcus aureus (BCID) NOT DETECTED NOT DETECTED Final   Streptococcus species DETECTED (Jaime Grizzell) NOT DETECTED Final    Comment: Not Enterococcus species, Streptococcus agalactiae, Streptococcus pyogenes, or Streptococcus pneumoniae. CRITICAL RESULT  CALLED TO, READ BACK BY AND VERIFIED WITH: J ORIET PHARMD 10/22/18 2300 JDW    Streptococcus agalactiae NOT DETECTED NOT DETECTED Final   Streptococcus  pneumoniae NOT DETECTED NOT DETECTED Final   Streptococcus pyogenes NOT DETECTED NOT DETECTED Final   Acinetobacter baumannii NOT DETECTED NOT DETECTED Final   Enterobacteriaceae species NOT DETECTED NOT DETECTED Final   Enterobacter cloacae complex NOT DETECTED NOT DETECTED Final   Escherichia coli NOT DETECTED NOT DETECTED Final   Klebsiella oxytoca NOT DETECTED NOT DETECTED Final   Klebsiella pneumoniae NOT DETECTED NOT DETECTED Final   Proteus species NOT DETECTED NOT DETECTED Final   Serratia marcescens NOT DETECTED NOT DETECTED Final   Haemophilus influenzae NOT DETECTED NOT DETECTED Final   Neisseria meningitidis NOT DETECTED NOT DETECTED Final   Pseudomonas aeruginosa NOT DETECTED NOT DETECTED Final   Candida albicans NOT DETECTED NOT DETECTED Final   Candida glabrata NOT DETECTED NOT DETECTED Final   Candida krusei NOT DETECTED NOT DETECTED Final   Candida parapsilosis NOT DETECTED NOT DETECTED Final   Candida tropicalis NOT DETECTED NOT DETECTED Final    Comment: Performed at [facility], [place] [address], [place], [place] [address]  Urine culture     Status: Abnormal   Collection Time: 10/22/18 11:22 AM  Result Value Ref Range Status   Specimen Description URINE, RANDOM  Final   Special Requests NONE  Final   Culture (Kherington Meraz)  Final    <10,000 COLONIES/mL INSIGNIFICANT GROWTH Performed at [place] [address] [place], [place] [address]    Report Status 10/23/2018 FINAL  Final  Blood culture (routine x 2)     Status: None (Preliminary result)   Collection Time: 10/22/18 11:23 AM  Result Value Ref Range Status   Specimen Description BLOOD RIGHT ANTECUBITAL  Final   Special Requests   Final    BOTTLES DRAWN AEROBIC AND ANAEROBIC Blood Culture adequate volume   Culture  Setup Time   Final    IN BOTH AEROBIC  AND ANAEROBIC BOTTLES GRAM POSITIVE COCCI CRITICAL VALUE NOTED.  VALUE IS CONSISTENT WITH PREVIOUSLY REPORTED AND CALLED VALUE. Performed at [facility], [place] [address] [place], [place] [address]    Culture GRAM POSITIVE COCCI  Final   Report Status PENDING  Incomplete  Respiratory Panel by PCR     Status: None   Collection Time: 10/22/18  5:44 PM  Result Value Ref Range Status   Adenovirus NOT DETECTED NOT DETECTED Final   Coronavirus 229E NOT DETECTED NOT DETECTED Final    Comment: (NOTE) The Coronavirus on the Respiratory Panel, DOES NOT test for the novel  Coronavirus (2019 nCoV)    Coronavirus HKU1 NOT DETECTED NOT DETECTED Final   Coronavirus NL63 NOT DETECTED NOT DETECTED Final   Coronavirus OC43 NOT DETECTED NOT DETECTED Final   Metapneumovirus NOT DETECTED NOT DETECTED Final   Rhinovirus / Enterovirus NOT DETECTED NOT DETECTED Final   Influenza Vani Gunner NOT DETECTED NOT DETECTED Final   Influenza B NOT DETECTED NOT DETECTED Final   Parainfluenza Virus 1 NOT DETECTED NOT DETECTED Final   Parainfluenza Virus 2 NOT DETECTED NOT DETECTED Final   Parainfluenza Virus 3 NOT DETECTED NOT DETECTED Final   Parainfluenza Virus 4 NOT DETECTED NOT DETECTED Final   Respiratory Syncytial Virus NOT DETECTED NOT DETECTED Final   Bordetella pertussis NOT DETECTED NOT DETECTED Final   Chlamydophila pneumoniae NOT DETECTED NOT DETECTED Final   Mycoplasma pneumoniae NOT DETECTED NOT DETECTED Final    Comment: Performed at [facility], [address], [place], [place] [address]  SARS Coronavirus 2 (CEPHEID- Performed in [facility] hospital lab),  Hosp Order     Status: None   Collection Time: 10/22/18  5:44 PM  Result Value Ref Range Status   SARS Coronavirus 2 NEGATIVE NEGATIVE Final    Comment: (NOTE) If result is NEGATIVE SARS-CoV-2 target nucleic acids are NOT DETECTED. The SARS-CoV-2 RNA is generally detectable in upper and lower  respiratory specimens during the acute phase  of infection. The lowest  concentration of SARS-CoV-2 viral copies this assay can detect is 250  copies / mL. Miklo Aken negative result does not preclude SARS-CoV-2 infection  and should not be used as the sole basis for treatment or other  patient management decisions.  Johnwilliam Shepperson negative result may occur with  improper specimen collection / handling, submission of specimen other  than nasopharyngeal swab, presence of viral mutation(s) within the  areas targeted by this assay, and inadequate number of viral copies  (<250 copies / mL). Mishawn Didion negative result must be combined with clinical  observations, patient history, and epidemiological information. If result is POSITIVE SARS-CoV-2 target nucleic acids are DETECTED. The SARS-CoV-2 RNA is generally detectable in upper and lower  respiratory specimens dur ing the acute phase of infection.  Positive  results are indicative of active infection with SARS-CoV-2.  Clinical  correlation with patient history and other diagnostic information is  necessary to determine patient infection status.  Positive results do  not rule out bacterial infection or co-infection with other viruses. If result is PRESUMPTIVE POSTIVE SARS-CoV-2 nucleic acids MAY BE PRESENT.   Aaryan Essman presumptive positive result was obtained on the submitted specimen  and confirmed on repeat testing.  While 2019 novel coronavirus  (SARS-CoV-2) nucleic acids may be present in the submitted sample  additional confirmatory testing may be necessary for epidemiological  and / or clinical management purposes  to differentiate between  SARS-CoV-2 and other Sarbecovirus currently known to infect humans.  If clinically indicated additional testing with an alternate test  methodology [phone]) is advised. The SARS-CoV-2 RNA is generally  detectable in upper and lower respiratory sp ecimens during the acute  phase of infection. The expected result is Negative. Fact Sheet for Patients:   StrictlyIdeas.no Fact Sheet for Healthcare Providers: BankingDealers.co.za This test is not yet approved or cleared by the [place] FDA and has been authorized for detection and/or diagnosis of SARS-CoV-2 by FDA under an Emergency Use Authorization (EUA).  This EUA will remain in effect (meaning this test can be used) for the duration of the COVID-19 declaration under Section 564(b)(1) of the Act, 21 U.S.C. section 360bbb-3(b)(1), unless the authorization is terminated or revoked sooner. Performed at [facility], [place] [address], [place], [place] [address]          Radiology Studies: Ct Angio Chest Pe W And/or Wo Contrast  Result Date: 10/22/2018 CLINICAL DATA:  Shortness of breath, chest tightness EXAM: CT ANGIOGRAPHY CHEST WITH CONTRAST TECHNIQUE: Multidetector CT imaging of the chest was performed using the standard protocol during bolus administration of intravenous contrast. Multiplanar CT image reconstructions and MIPs were obtained to evaluate the vascular anatomy. CONTRAST:  1110mL OMNIPAQUE IOHEXOL 350 MG/ML SOLN COMPARISON:  None. FINDINGS: Cardiovascular: Satisfactory opacification of the pulmonary arteries to the segmental level. No evidence of pulmonary embolism. Cardiomegaly. Extensive 3 vessel coronary artery calcifications and/or stents. Left chest multi lead pacer. No pericardial effusion. Mediastinum/Nodes: No enlarged mediastinal, hilar, or axillary lymph nodes. Thyroid gland, trachea, and esophagus demonstrate no significant findings. Lungs/Pleura: Bibasilar scarring or atelectasis. No pleural effusion or pneumothorax. Upper Abdomen: No  acute abnormality. Postoperative findings of splenectomy. Musculoskeletal: No chest wall abnormality. No acute or significant osseous findings. Review of the MIP images confirms the above findings. IMPRESSION: 1.  Negative examination for pulmonary embolism. 2.  Bibasilar scarring or  atelectasis. 3.  Cardiomegaly and coronary artery disease. Electronically Signed   By: Eddie Candle M.D.   On: 10/22/2018 16:14   Dg Chest Portable 1 View  Result Date: 10/22/2018 CLINICAL DATA:  Dyspnea and shortness of breath EXAM: PORTABLE CHEST 1 VIEW COMPARISON:  08/13/2018 FINDINGS: Cardiac shadow is enlarged but accentuated by the portable technique. Pacing device is noted. No focal infiltrate or sizable effusion is seen. No acute bony abnormality is noted. IMPRESSION: No acute abnormality noted. Electronically Signed   By: Inez Catalina M.D.   On: 10/22/2018 11:48        Scheduled Meds:  [START ON 10/24/2018] anastrozole  1 mg Oral Once per day on Mon Thu   aspirin EC  81 mg Oral Daily   enoxaparin (LOVENOX) injection  40 mg Subcutaneous Q24H   ezetimibe  10 mg Oral Daily   fluticasone  2 spray Each Nare Daily   ipratropium-albuterol  3 mL Nebulization Q6H   pantoprazole  40 mg Oral Daily   pravastatin  40 mg Oral q1800   tamsulosin  0.4 mg Oral QPC breakfast   thyroid  120 mg Oral QAC breakfast   thyroid  60 mg Oral QPM   ticagrelor  90 mg Oral BID   Continuous Infusions:  cefTRIAXone (ROCEPHIN)  IV 2 g (10/23/18 1200)   lactated ringers 1 mL (10/23/18 1106)     LOS: 1 day    Time spent: over 30 min    Fayrene Helper, MD [facility] Pager AMION  If 7PM-7AM, please contact night-coverage [email or web address] Password [facility] 10/23/2018, 2:49 PM

## 2018-10-23 NOTE — Progress Notes (Signed)
CRITICAL VALUE ALERT  Critical Value:  Lactic acid 3.1  Date & Time Notied:  10/23/2018 0840  Provider Notified: Dr. Florene Glen  Orders Received/Actions taken: informed Dr. Florene Glen no new order at this time.

## 2018-10-23 NOTE — Plan of Care (Signed)
  Problem: Activity: Goal: Ability to return to baseline activity level will improve Outcome: Progressing   Problem: Cardiovascular: Goal: Ability to achieve and maintain adequate cardiovascular perfusion will improve Outcome: Progressing Goal: Vascular access site(s) Level 0-1 will be maintained Outcome: Progressing   

## 2018-10-23 NOTE — Progress Notes (Signed)
  Echocardiogram 2D Echocardiogram has been performed.  [name] Glass 10/23/2018, 3:23 PM

## 2018-10-23 NOTE — Consult Note (Signed)
CARDIOLOGY CONSULT NOTE   Patient ID: [name] Glass MRN: [id number], DOB/AGE: 11/23/1940   Admit date: 10/22/2018 Date of Consult: 10/23/2018   Primary Physician: [name], MD Primary Cardiologist: Quay Burow  Consulting Physician: Guilford Shi Reason for Consult: sepsis, concern for endocarditis/device infection  Pt. Profile 78 yo male w/ CAD (multiple, last atherectomy and LAD stent on 06/2018), PM (sinus node dysfunction), HTN and HLD who presents with sepsis   Problem List  Past Medical History:  Diagnosis Date  . Arthritis   . Coronary artery disease   . GERD (gastroesophageal reflux disease)   . H/O bronchitis   . H/O hiatal hernia   . History of kidney stones last in 1976  . Hyperlipidemia    statin intolerant, under control  . Hypertension    under control  . Hypothyroidism   . Sleep apnea    hx of, surgery to reverse    Past Surgical History:  Procedure Laterality Date  . CARDIAC CATHETERIZATION Right 2008   5 heart stents  . CATARACT EXTRACTION Bilateral 6 years ago  . CYSTOSCOPY N/A 08/06/2012   Procedure: CYSTOSCOPY;  Surgeon: [name], MD;  Location: [facility];  Service: Urology;  Laterality: N/A;  . ESOPHAGOGASTRIC FUNDOPLICATION  1497   spleen removed, 5 pints of blood given  . HERNIA REPAIR  1985   x4  . INGUINAL HERNIA REPAIR  1992  . PROSTATE ABLATION  2013   "laser ablation"  . TONSILLECTOMY  as child  . TOTAL KNEE ARTHROPLASTY Right 08/03/2014   Procedure: TOTAL RIGHT KNEE ARTHROPLASTY;  Surgeon: [name], MD;  Location: [facility];  Service: Orthopedics;  Laterality: Right;  . TRANSURETHRAL RESECTION OF PROSTATE N/A 08/06/2012   Procedure: Almyra Free OF PROSTATE WITH GYRUS ;  Surgeon: [name], MD;  Location: [facility];  Service: Urology;  Laterality: N/A;  . UMBILICAL HERNIA REPAIR  1990  . UVULECTOMY  2005   for sleep apnea  . UVULOPALATOPHARYNGOPLASTY  6 years ago     Allergies  Allergies  Allergen Reactions  .  Morphine And Related     Had "crazy dreams" felt "crazy" per pt.  . Bactrim [Sulfamethoxazole-Trimethoprim]   . Statins     HPI  78 yo male w/ CAD (multiple, last atherectomy and LAD stent on 06/2018), PM (sinus node dysfunction), HTN and HLD who presents with sepsis.  He reports feeling fatigued on 5/15-16. On 5/18 he awoke with rigors and shortness of breath. Some orthopnea that is new. Noticed his PM site has been more tender also. Some what confused on exam, but AAOx3 when asked. However he does reports that he has been battling a cough for several months, requiring 2 rounds of antibiotics. [facility] in January (for cellulitis) and then [facility] in march for bronchitis. His cough worsened prior to admission. Noticed also worsening discomfort around PM site. No discharge, no warmth. Some new lower back pain over the last 1-2 weeks (around sacrum).   When he presented to the ED on 5/19 he was noted to have temp of 100.51F, tested for COVID x 2 (negative) and started on sepsis protocol with antibiotics. CT scan of his chest did not show pneumonia.   Inpatient Medications  . [START ON 10/24/2018] anastrozole  1 mg Oral Once per day on Mon Thu  . aspirin EC  81 mg Oral Daily  . enoxaparin (LOVENOX) injection  40 mg Subcutaneous Q24H  . ezetimibe  10 mg Oral Daily  .  fluticasone  2 spray Each Nare Daily  . ipratropium-albuterol  3 mL Nebulization Q6H  . pantoprazole  40 mg Oral Daily  . pravastatin  40 mg Oral q1800  . tamsulosin  0.4 mg Oral QPC breakfast  . thyroid  120 mg Oral QAC breakfast  . thyroid  60 mg Oral QPM  . ticagrelor  90 mg Oral BID    Family History Family History  Problem Relation Age of Onset  . Heart attack Father 94       lived to 49  . Alzheimer's disease Mother   . Stroke Mother   . Breast cancer Mother   . Alzheimer's disease Maternal Grandmother   . Stroke Maternal Grandmother   . Lung cancer Maternal Grandmother   . Lung cancer Maternal Grandfather   . Cancer  Paternal Grandmother        type unknown  . Diabetes Paternal Grandfather   . Other Paternal Grandfather        Growth in the back of the head     Social History Social History   Socioeconomic History  . Marital status: Divorced    Spouse name: Not on file  . Number of children: 0  . Years of education: Not on file  . Highest education level: Not on file  Occupational History  . Occupation: Retired   Scientific laboratory technician  . Financial resource strain: Not on file  . Food insecurity:    Worry: Not on file    Inability: Not on file  . Transportation needs:    Medical: Not on file    Non-medical: Not on file  Tobacco Use  . Smoking status: Never Smoker  . Smokeless tobacco: Never Used  Substance and Sexual Activity  . Alcohol use: Yes    Alcohol/week: 4.0 standard drinks    Types: 4 Standard drinks or equivalent per week    Comment: 3 drinks at night   . Drug use: No  . Sexual activity: Not on file  Lifestyle  . Physical activity:    Days per week: Not on file    Minutes per session: Not on file  . Stress: Not on file  Relationships  . Social connections:    Talks on phone: Not on file    Gets together: Not on file    Attends religious service: Not on file    Active member of club or organization: Not on file    Attends meetings of clubs or organizations: Not on file    Relationship status: Not on file  . Intimate partner violence:    Fear of current or ex partner: Not on file    Emotionally abused: Not on file    Physically abused: Not on file    Forced sexual activity: Not on file  Other Topics Concern  . Not on file  Social History Narrative  . Not on file     Review of Systems  General:  +chills, fever, night sweats or weight changes.  Cardiovascular:  +chest pain, dyspnea on exertion, edema, orthopnea, palpitations, paroxysmal nocturnal dyspnea. Dermatological: No rash, lesions/masses Respiratory: +cough, dyspnea Urologic: No hematuria, dysuria Abdominal:    No nausea, vomiting, diarrhea, bright red blood per rectum, melena, or hematemesis Neurologic:  No visual changes, wkns, changes in mental status. All other systems reviewed and are otherwise negative except as noted above.  Physical Exam  Blood pressure (!) 145/89, pulse 84, temperature 98.7 F (37.1 C), temperature source Oral, resp. rate 20, height 6' (  1.829 m), weight 96.2 kg, SpO2 97 %.  General: Pleasant, uncomfortable Psych: Normal affect. Neuro: Alert and oriented X 3 (but confused at times/difficulty completing sentences/thoughts). Moves all extremities spontaneously. HEENT: Normal  Neck: Supple without bruits or JVD. Lungs:  Resp regular and unlabored, CTA. Heart: RRR no s3, s4,, II/VI syst murmur. Abdomen: Soft, non-tender, non-distended, BS + x 4.  Extremities: No clubbing, cyanosis or edema. DP/PT/Radials 2+ and equal bilaterally. Prior LHC/PM access sites (R+L groin) w/o pain/erythema or fluid L sided PM site with 2 white scabs and small dark scab, thin skin. No fluid appreciated.  Labs  Recent Labs    10/22/18 1050 10/22/18 2015  TROPONINI 0.07* 0.26*   Lab Results  Component Value Date   WBC 18.5 (H) 10/22/2018   HGB 16.8 10/22/2018   HCT 49.1 10/22/2018   MCV 91.9 10/22/2018   PLT 199 10/22/2018    Recent Labs  Lab 10/22/18 1050 10/22/18 2015  NA 141  --   K 4.4  --   CL 108  --   CO2 17*  --   BUN 13  --   CREATININE 1.24 1.97*  CALCIUM 10.3  --   PROT 7.1  --   BILITOT 1.8*  --   ALKPHOS 66  --   ALT 25  --   AST 43*  --   GLUCOSE 162*  --    Lab Results  Component Value Date   CHOL 156 09/04/2018   HDL 38 (L) 09/04/2018   LDLCALC 82 09/04/2018   TRIG 179 (H) 09/04/2018   No results found for: DDIMER  Radiology/Studies  Ct Angio Chest Pe W And/or Wo Contrast  Result Date: 10/22/2018 CLINICAL DATA:  Shortness of breath, chest tightness EXAM: CT ANGIOGRAPHY CHEST WITH CONTRAST TECHNIQUE: Multidetector CT imaging of the chest was  performed using the standard protocol during bolus administration of intravenous contrast. Multiplanar CT image reconstructions and MIPs were obtained to evaluate the vascular anatomy. CONTRAST:  170mL OMNIPAQUE IOHEXOL 350 MG/ML SOLN COMPARISON:  None. FINDINGS: Cardiovascular: Satisfactory opacification of the pulmonary arteries to the segmental level. No evidence of pulmonary embolism. Cardiomegaly. Extensive 3 vessel coronary artery calcifications and/or stents. Left chest multi lead pacer. No pericardial effusion. Mediastinum/Nodes: No enlarged mediastinal, hilar, or axillary lymph nodes. Thyroid gland, trachea, and esophagus demonstrate no significant findings. Lungs/Pleura: Bibasilar scarring or atelectasis. No pleural effusion or pneumothorax. Upper Abdomen: No acute abnormality. Postoperative findings of splenectomy. Musculoskeletal: No chest wall abnormality. No acute or significant osseous findings. Review of the MIP images confirms the above findings. IMPRESSION: 1.  Negative examination for pulmonary embolism. 2.  Bibasilar scarring or atelectasis. 3.  Cardiomegaly and coronary artery disease. Electronically Signed   By: Eddie Candle M.D.   On: 10/22/2018 16:14   Dg Chest Portable 1 View  Result Date: 10/22/2018 CLINICAL DATA:  Dyspnea and shortness of breath EXAM: PORTABLE CHEST 1 VIEW COMPARISON:  08/13/2018 FINDINGS: Cardiac shadow is enlarged but accentuated by the portable technique. Pacing device is noted. No focal infiltrate or sizable effusion is seen. No acute bony abnormality is noted. IMPRESSION: No acute abnormality noted. Electronically Signed   By: Inez Catalina M.D.   On: 10/22/2018 11:48    ECG Tachy, RBBB  ASSESSMENT AND PLAN 78 yo male w/ CAD (multiple, last atherectomy and LAD stent on 06/2018), PM (sinus node dysfunction), HTN and HLD who presents with sepsis  # Sepsis: agree with current work-up. Bld cultures + for strep. No evidence of infection  at site of prior LHC  (groin). PM site w/o clear evidence of fluid/erythema. Existing murmur according to notes. Lower back/sarcum pain is midline and para-spinal. Dentition good. No skin infections evident  - TTE first to assess for endocarditis, however if other work-up is negative may need TTE to evaluate if PM leads are source of infection.   # Troponin elevation: demand ischemia in setting of active infection (lactate 5.0) and cr 1.9.  - trend, but not ACS  Consult service will follow along as results return and ID provides additional recommendations.   Signed, Charlies Silvers, MD

## 2018-10-23 NOTE — Consult Note (Signed)
[facility]    Date of Admission:  10/22/2018           Day 1 ceftriaxone       Reason for Consult: Streptococcal bacteremia    Referring Provider: Dr. Fayrene Helper Primary Care Provider: Dr. Scarlette Calico  Assessment: He has acute streptococcal bacteremia.  I am concerned that it may be coming from an infected pacemaker pocket.  I am also concerned that it may be complicated by acute lumbar vertebral infection.  I agree with continuing ceftriaxone, repeating blood cultures and awaiting results of TTE and lumbar MRI.  Plan: 1. Continue ceftriaxone 2. Await results of repeat blood cultures, TTE and lumbar MRI  Principal Problem:   Streptococcal bacteremia Active Problems:   Presence of permanent cardiac pacemaker   Acute low back pain   Chronic bronchitis (HCC)   Coronary artery disease   OA (osteoarthritis) of knee   Gastroesophageal reflux disease without esophagitis   Essential hypertension, benign   Hyperlipidemia with target LDL less than 70   Chronic renal disease, stage 2, mildly decreased glomerular filtration rate (GFR) between 60-89 mL/min/1.73 square meter   Sepsis ([place])   H/O splenectomy   Sleep apnea   Scheduled Meds: . [START ON 10/24/2018] anastrozole  1 mg Oral Once per day on Mon Thu  . aspirin EC  81 mg Oral Daily  . enoxaparin (LOVENOX) injection  40 mg Subcutaneous Q24H  . ezetimibe  10 mg Oral Daily  . fluticasone  2 spray Each Nare Daily  . ipratropium-albuterol  3 mL Nebulization Q6H  . pantoprazole  40 mg Oral Daily  . pravastatin  40 mg Oral q1800  . tamsulosin  0.4 mg Oral QPC breakfast  . thyroid  120 mg Oral QAC breakfast  . thyroid  60 mg Oral QPM  . ticagrelor  90 mg Oral BID   Continuous Infusions: . cefTRIAXone (ROCEPHIN)  IV    . lactated ringers    . lactated ringers 1 mL (10/23/18 1106)   PRN Meds:.acetaminophen, HYDROmorphone (DILAUDID) injection, ondansetron **OR** ondansetron (ZOFRAN) IV,  oxyCODONE  HPI: [name] Glass is a 78 y.o. male with known coronary artery disease who syncope while visiting [facility] earlier this year.  He had a permanent pacemaker placed in [place] in January.  Had some soreness around the pocket and some mild erythema ever since.  Is one area where the skin is very thin and he has had a small scabbed area that has persisted.  He has not had any drainage.  Last week he had sudden onset of severe right lower back pain that has persisted.  Yesterday he woke up with severe shaking chills leading to admission.  He was found to have fever to 102.5 degrees.  Both admission blood cultures are growing strep species.   Review of Systems: Review of Systems  Constitutional: Positive for chills, fever and malaise/fatigue. Negative for diaphoresis and weight loss.  HENT: Negative for congestion and sore throat.   Respiratory: Positive for cough and sputum production.   Cardiovascular: Negative for chest pain.  Gastrointestinal: Negative for abdominal pain, diarrhea, nausea and vomiting.  Genitourinary: Negative for dysuria.  Musculoskeletal: Positive for back pain.  Skin: Negative for rash.    Past Medical History:  Diagnosis Date  . Arthritis   . Coronary artery disease   . GERD (gastroesophageal reflux disease)   . H/O bronchitis   . H/O hiatal hernia   .  History of kidney stones last in 1976  . Hyperlipidemia    statin intolerant, under control  . Hypertension    under control  . Hypothyroidism   . Sleep apnea    hx of, surgery to reverse    Social History   Tobacco Use  . Smoking status: Never Smoker  . Smokeless tobacco: Never Used  Substance Use Topics  . Alcohol use: Yes    Alcohol/week: 4.0 standard drinks    Types: 4 Standard drinks or equivalent per week    Comment: 3 drinks at night   . Drug use: No    Family History  Problem Relation Age of Onset  . Heart attack Father 79       lived to 59  . Alzheimer's disease  Mother   . Stroke Mother   . Breast cancer Mother   . Alzheimer's disease Maternal Grandmother   . Stroke Maternal Grandmother   . Lung cancer Maternal Grandmother   . Lung cancer Maternal Grandfather   . Cancer Paternal Grandmother        type unknown  . Diabetes Paternal Grandfather   . Other Paternal Grandfather        Growth in the back of the head   Allergies  Allergen Reactions  . Morphine And Related     Had "crazy dreams" felt "crazy" per pt.  . Bactrim [Sulfamethoxazole-Trimethoprim]   . Statins     OBJECTIVE: Blood pressure (!) 108/56, pulse 66, temperature 97.9 F (36.6 C), temperature source Oral, resp. rate 20, height 6' (1.829 m), weight 97.5 kg, SpO2 97 %.  Physical Exam Constitutional:      Comments: He appears uncomfortable due to pain.  He is sitting up in a chair.  HENT:     Mouth/Throat:     Pharynx: No oropharyngeal exudate.  Eyes:     Conjunctiva/sclera: Conjunctivae normal.  Cardiovascular:     Rate and Rhythm: Normal rate and regular rhythm.     Heart sounds: Murmur present.     Comments: 2/6 systolic murmur. Pulmonary:     Effort: Pulmonary effort is normal.     Breath sounds: Normal breath sounds.  Chest:    Abdominal:     Palpations: Abdomen is soft.     Tenderness: There is no abdominal tenderness.     Lab Results Lab Results  Component Value Date   WBC 22.1 (H) 10/23/2018   HGB 15.9 10/23/2018   HCT 46.0 10/23/2018   MCV 92.2 10/23/2018   PLT 177 10/23/2018    Lab Results  Component Value Date   CREATININE 1.93 (H) 10/23/2018   BUN 26 (H) 10/23/2018   NA 141 10/23/2018   K 4.3 10/23/2018   CL 111 10/23/2018   CO2 18 (L) 10/23/2018    Lab Results  Component Value Date   ALT 25 10/22/2018   AST 43 (H) 10/22/2018   ALKPHOS 66 10/22/2018   BILITOT 1.8 (H) 10/22/2018     Microbiology: Recent Results (from the past 240 hour(s))  SARS Coronavirus 2 (CEPHEID- Performed in [facility] hospital lab), Hosp Order      Status: None   Collection Time: 10/22/18 10:47 AM  Result Value Ref Range Status   SARS Coronavirus 2 NEGATIVE NEGATIVE Final    Comment: (NOTE) If result is NEGATIVE SARS-CoV-2 target nucleic acids are NOT DETECTED. The SARS-CoV-2 RNA is generally detectable in upper and lower  respiratory specimens during the acute phase of infection. The lowest  concentration of  SARS-CoV-2 viral copies this assay can detect is 250  copies / mL. A negative result does not preclude SARS-CoV-2 infection  and should not be used as the sole basis for treatment or other  patient management decisions.  A negative result may occur with  improper specimen collection / handling, submission of specimen other  than nasopharyngeal swab, presence of viral mutation(s) within the  areas targeted by this assay, and inadequate number of viral copies  (<250 copies / mL). A negative result must be combined with clinical  observations, patient history, and epidemiological information. If result is POSITIVE SARS-CoV-2 target nucleic acids are DETECTED. The SARS-CoV-2 RNA is generally detectable in upper and lower  respiratory specimens dur ing the acute phase of infection.  Positive  results are indicative of active infection with SARS-CoV-2.  Clinical  correlation with patient history and other diagnostic information is  necessary to determine patient infection status.  Positive results do  not rule out bacterial infection or co-infection with other viruses. If result is PRESUMPTIVE POSTIVE SARS-CoV-2 nucleic acids MAY BE PRESENT.   A presumptive positive result was obtained on the submitted specimen  and confirmed on repeat testing.  While 2019 novel coronavirus  (SARS-CoV-2) nucleic acids may be present in the submitted sample  additional confirmatory testing may be necessary for epidemiological  and / or clinical management purposes  to differentiate between  SARS-CoV-2 and other Sarbecovirus currently known to  infect humans.  If clinically indicated additional testing with an alternate test  methodology [phone]) is advised. The SARS-CoV-2 RNA is generally  detectable in upper and lower respiratory sp ecimens during the acute  phase of infection. The expected result is Negative. Fact Sheet for Patients:  StrictlyIdeas.no Fact Sheet for Healthcare Providers: BankingDealers.co.za This test is not yet approved or cleared by the [place] FDA and has been authorized for detection and/or diagnosis of SARS-CoV-2 by FDA under an Emergency Use Authorization (EUA).  This EUA will remain in effect (meaning this test can be used) for the duration of the COVID-19 declaration under Section 564(b)(1) of the Act, 21 U.S.C. section 360bbb-3(b)(1), unless the authorization is terminated or revoked sooner. Performed at [facility], [place] [address] [place], [place] [address]   Blood culture (routine x 2)     Status: None (Preliminary result)   Collection Time: 10/22/18 10:52 AM  Result Value Ref Range Status   Specimen Description BLOOD BLOOD LEFT FOREARM  Final   Special Requests   Final    BOTTLES DRAWN AEROBIC AND ANAEROBIC Blood Culture adequate volume   Culture  Setup Time   Final    IN BOTH AEROBIC AND ANAEROBIC BOTTLES GRAM POSITIVE COCCI CRITICAL RESULT CALLED TO, READ BACK BY AND VERIFIED WITH: Derry Lory [facility] 10/22/18 2300 JDW Performed at [facility], [address], [place], [place] [address]    Culture GRAM POSITIVE COCCI  Final   Report Status PENDING  Incomplete  Blood Culture ID Panel (Reflexed)     Status: Abnormal   Collection Time: 10/22/18 10:52 AM  Result Value Ref Range Status   Enterococcus species NOT DETECTED NOT DETECTED Final   Listeria monocytogenes NOT DETECTED NOT DETECTED Final   Staphylococcus species NOT DETECTED NOT DETECTED Final   Staphylococcus aureus (BCID) NOT DETECTED NOT DETECTED Final    Streptococcus species DETECTED (A) NOT DETECTED Final    Comment: Not Enterococcus species, Streptococcus agalactiae, Streptococcus pyogenes, or Streptococcus pneumoniae. CRITICAL RESULT CALLED TO, READ BACK BY  AND VERIFIED WITH: J ORIET PHARMD 10/22/18 2300 JDW    Streptococcus agalactiae NOT DETECTED NOT DETECTED Final   Streptococcus pneumoniae NOT DETECTED NOT DETECTED Final   Streptococcus pyogenes NOT DETECTED NOT DETECTED Final   Acinetobacter baumannii NOT DETECTED NOT DETECTED Final   Enterobacteriaceae species NOT DETECTED NOT DETECTED Final   Enterobacter cloacae complex NOT DETECTED NOT DETECTED Final   Escherichia coli NOT DETECTED NOT DETECTED Final   Klebsiella oxytoca NOT DETECTED NOT DETECTED Final   Klebsiella pneumoniae NOT DETECTED NOT DETECTED Final   Proteus species NOT DETECTED NOT DETECTED Final   Serratia marcescens NOT DETECTED NOT DETECTED Final   Haemophilus influenzae NOT DETECTED NOT DETECTED Final   Neisseria meningitidis NOT DETECTED NOT DETECTED Final   Pseudomonas aeruginosa NOT DETECTED NOT DETECTED Final   Candida albicans NOT DETECTED NOT DETECTED Final   Candida glabrata NOT DETECTED NOT DETECTED Final   Candida krusei NOT DETECTED NOT DETECTED Final   Candida parapsilosis NOT DETECTED NOT DETECTED Final   Candida tropicalis NOT DETECTED NOT DETECTED Final    Comment: Performed at [facility], [place] [address] [place], [place] [address]  Urine culture     Status: Abnormal   Collection Time: 10/22/18 11:22 AM  Result Value Ref Range Status   Specimen Description URINE, RANDOM  Final   Special Requests NONE  Final   Culture (A)  Final    <10,000 COLONIES/mL INSIGNIFICANT GROWTH Performed at [facility] [address], [place], [place] [address]    Report Status 10/23/2018 FINAL  Final  Blood culture (routine x 2)     Status: None (Preliminary result)   Collection Time: 10/22/18 11:23 AM  Result Value Ref Range Status   Specimen  Description BLOOD RIGHT ANTECUBITAL  Final   Special Requests   Final    BOTTLES DRAWN AEROBIC AND ANAEROBIC Blood Culture adequate volume   Culture  Setup Time   Final    IN BOTH AEROBIC AND ANAEROBIC BOTTLES GRAM POSITIVE COCCI CRITICAL VALUE NOTED.  VALUE IS CONSISTENT WITH PREVIOUSLY REPORTED AND CALLED VALUE. Performed at [facility], [place] [address], [place], [place] [address]    Culture GRAM POSITIVE COCCI  Final   Report Status PENDING  Incomplete  Respiratory Panel by PCR     Status: None   Collection Time: 10/22/18  5:44 PM  Result Value Ref Range Status   Adenovirus NOT DETECTED NOT DETECTED Final   Coronavirus 229E NOT DETECTED NOT DETECTED Final    Comment: (NOTE) The Coronavirus on the Respiratory Panel, DOES NOT test for the novel  Coronavirus (2019 nCoV)    Coronavirus HKU1 NOT DETECTED NOT DETECTED Final   Coronavirus NL63 NOT DETECTED NOT DETECTED Final   Coronavirus OC43 NOT DETECTED NOT DETECTED Final   Metapneumovirus NOT DETECTED NOT DETECTED Final   Rhinovirus / Enterovirus NOT DETECTED NOT DETECTED Final   Influenza A NOT DETECTED NOT DETECTED Final   Influenza B NOT DETECTED NOT DETECTED Final   Parainfluenza Virus 1 NOT DETECTED NOT DETECTED Final   Parainfluenza Virus 2 NOT DETECTED NOT DETECTED Final   Parainfluenza Virus 3 NOT DETECTED NOT DETECTED Final   Parainfluenza Virus 4 NOT DETECTED NOT DETECTED Final   Respiratory Syncytial Virus NOT DETECTED NOT DETECTED Final   Bordetella pertussis NOT DETECTED NOT DETECTED Final   Chlamydophila pneumoniae NOT DETECTED NOT DETECTED Final   Mycoplasma pneumoniae NOT DETECTED NOT DETECTED Final    Comment: Performed at  [facility], [place] [address], [place], [place] [address]  SARS Coronavirus 2 (CEPHEID- Performed in [facility] hospital lab), Hosp Order     Status: None   Collection Time: 10/22/18  5:44 PM  Result Value Ref Range Status   SARS Coronavirus 2 NEGATIVE NEGATIVE Final    Comment:  (NOTE) If result is NEGATIVE SARS-CoV-2 target nucleic acids are NOT DETECTED. The SARS-CoV-2 RNA is generally detectable in upper and lower  respiratory specimens during the acute phase of infection. The lowest  concentration of SARS-CoV-2 viral copies this assay can detect is 250  copies / mL. A negative result does not preclude SARS-CoV-2 infection  and should not be used as the sole basis for treatment or other  patient management decisions.  A negative result may occur with  improper specimen collection / handling, submission of specimen other  than nasopharyngeal swab, presence of viral mutation(s) within the  areas targeted by this assay, and inadequate number of viral copies  (<250 copies / mL). A negative result must be combined with clinical  observations, patient history, and epidemiological information. If result is POSITIVE SARS-CoV-2 target nucleic acids are DETECTED. The SARS-CoV-2 RNA is generally detectable in upper and lower  respiratory specimens dur ing the acute phase of infection.  Positive  results are indicative of active infection with SARS-CoV-2.  Clinical  correlation with patient history and other diagnostic information is  necessary to determine patient infection status.  Positive results do  not rule out bacterial infection or co-infection with other viruses. If result is PRESUMPTIVE POSTIVE SARS-CoV-2 nucleic acids MAY BE PRESENT.   A presumptive positive result was obtained on the submitted specimen  and confirmed on repeat testing.  While 2019 novel coronavirus  (SARS-CoV-2) nucleic acids may be present in the submitted sample  additional confirmatory testing may be necessary for epidemiological  and / or clinical management purposes  to differentiate between  SARS-CoV-2 and other Sarbecovirus currently known to infect humans.  If clinically indicated additional testing with an alternate test  methodology [phone]) is advised. The SARS-CoV-2 RNA is  generally  detectable in upper and lower respiratory sp ecimens during the acute  phase of infection. The expected result is Negative. Fact Sheet for Patients:  StrictlyIdeas.no Fact Sheet for Healthcare Providers: BankingDealers.co.za This test is not yet approved or cleared by the [place] FDA and has been authorized for detection and/or diagnosis of SARS-CoV-2 by FDA under an Emergency Use Authorization (EUA).  This EUA will remain in effect (meaning this test can be used) for the duration of the COVID-19 declaration under Section 564(b)(1) of the Act, 21 U.S.C. section 360bbb-3(b)(1), unless the authorization is terminated or revoked sooner. Performed at [facility], [place] [address] [place], [place] [address]     Michel Bickers, [facility] [phone] pager   [phone] cell 10/23/2018, 11:24 AM

## 2018-10-24 ENCOUNTER — Inpatient Hospital Stay (HOSPITAL_COMMUNITY): Payer: Medicare Other

## 2018-10-24 DIAGNOSIS — R0602 Shortness of breath: Secondary | ICD-10-CM

## 2018-10-24 DIAGNOSIS — I495 Sick sinus syndrome: Secondary | ICD-10-CM

## 2018-10-24 LAB — URINE CULTURE: Culture: NO GROWTH

## 2018-10-24 LAB — MAGNESIUM: Magnesium: 1.4 mg/dL — ABNORMAL LOW (ref 1.7–2.4)

## 2018-10-24 LAB — LACTIC ACID, PLASMA
Lactic Acid, Venous: 2 mmol/L (ref 0.5–1.9)
Lactic Acid, Venous: 1.8 mmol/L (ref 0.5–1.9)

## 2018-10-24 LAB — COMPREHENSIVE METABOLIC PANEL
ALT: 31 U/L (ref 0–44)
AST: 34 U/L (ref 15–41)
Albumin: 2.6 g/dL — ABNORMAL LOW (ref 3.5–5.0)
Alkaline Phosphatase: 43 U/L (ref 38–126)
Anion gap: 9 (ref 5–15)
BUN: 23 mg/dL (ref 8–23)
CO2: 22 mmol/L (ref 22–32)
Calcium: 9 mg/dL (ref 8.9–10.3)
Chloride: 108 mmol/L (ref 98–111)
Creatinine, Ser: 1.31 mg/dL — ABNORMAL HIGH (ref 0.61–1.24)
GFR calc Af Amer: >60 mL/min (ref >60)
GFR calc non Af Amer: 52 mL/min — ABNORMAL LOW (ref >60)
Glucose, Bld: 99 mg/dL (ref 70–99)
Potassium: 3.9 mmol/L (ref 3.5–5.1)
Sodium: 139 mmol/L (ref 135–145)
Total Bilirubin: 1 mg/dL (ref 0.3–1.2)
Total Protein: 5.3 g/dL — ABNORMAL LOW (ref 6.5–8.1)

## 2018-10-24 LAB — CBC
HCT: 41.7 % (ref 39.0–52.0)
Hemoglobin: 14.4 g/dL (ref 13.0–17.0)
MCH: 31.6 pg (ref 26.0–34.0)
MCHC: 34.5 g/dL (ref 30.0–36.0)
MCV: 91.6 fL (ref 80.0–100.0)
Platelets: 158 10*3/uL (ref 150–400)
RBC: 4.55 MIL/uL (ref 4.22–5.81)
RDW: 18.6 % — ABNORMAL HIGH (ref 11.5–15.5)
WBC: 24.1 10*3/uL — ABNORMAL HIGH (ref 4.0–10.5)
nRBC: 0.2 % (ref 0.0–0.2)

## 2018-10-24 LAB — CULTURE, BLOOD (ROUTINE X 2)
Special Requests: ADEQUATE
Special Requests: ADEQUATE

## 2018-10-24 MED ORDER — SODIUM CHLORIDE 0.9 % IV SOLN
INTRAVENOUS | Status: DC
  Administered 2018-10-25: 01:00:00 via INTRAVENOUS

## 2018-10-24 MED ORDER — CLOPIDOGREL BISULFATE 75 MG PO TABS: 75.0000 mg | ORAL_TABLET | Freq: Every day | ORAL | Status: DC

## 2018-10-24 MED ORDER — PENICILLIN G POTASSIUM 20000000 UNITS IJ SOLR
12.0000 10*6.[IU] | Freq: Two times a day (BID) | INTRAVENOUS | Status: DC
  Administered 2018-10-24 – 2018-10-25 (×2): 12 10*6.[IU] via INTRAVENOUS
  Filled 2018-10-24 (×3): qty 12

## 2018-10-24 MED ORDER — CLOPIDOGREL BISULFATE 75 MG PO TABS
300.0000 mg | ORAL_TABLET | Freq: Once | ORAL | Status: AC
  Administered 2018-10-24: 300 mg via ORAL
  Filled 2018-10-24: qty 4

## 2018-10-24 MED ORDER — IPRATROPIUM-ALBUTEROL 0.5-2.5 (3) MG/3ML IN SOLN
3.0000 mL | Freq: Two times a day (BID) | RESPIRATORY_TRACT | Status: DC
  Administered 2018-10-24 – 2018-10-28 (×7): 3 mL via RESPIRATORY_TRACT
  Filled 2018-10-24 (×9): qty 3

## 2018-10-24 NOTE — Progress Notes (Signed)
[facility]  Date of Admission:  10/22/2018      Total days of antibiotics 3   Day 2 Ceftriaxone            ASSESSMENT: [name] Glass is feeling much better today overall especially with regards to his back pain. He is now able to move around freely in the bed/chair and was able to sleep last night. 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 We can narrow him to PCN infusion while in the hospital. Presume he will D/C home on ceftriaxone for a simpler regimen without continuous pump hook up. Follow repeated blood cultures for clearance prior to PICC line. Swollen/tender hand joints ?reactive arthritis with strep bacteremia.   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 He may have early discitis/vertebral infection given sudden acute back pain with bacteremia, however his rapid improvement back to baseline I think would be unexpected after ony 3 days of antibiotics. Cardiology arranging EP/TEE to evaluate his pacemaker (placed January 2020) and heart valves.   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 It sounds as if he has been compliant with Q70yr screens with a completely negative colonoscopy 3 years ago and since this time he has had no changes to BMs or blood noted in stool ever. Seems less likely this needs to be explored at this time especially if TEE has definitive findings.   He has a R TKR that is non-painful, non-swollen and normal range of motion.    PLAN: 1. Change Ceftriaxone to PCN G infusion for now 2. Hold on PICC line until blood cultures clear x 48 h 3. EP to see and arrange TEE    Principal Problem:   Streptococcal bacteremia Active Problems:   Coronary artery disease   OA (osteoarthritis) of knee    Gastroesophageal reflux disease without esophagitis   Essential hypertension, benign   Hyperlipidemia with target LDL less than 70   Chronic renal disease, stage 2, mildly decreased glomerular filtration rate (GFR) between 60-89 mL/min/1.73 square meter   Sepsis ([place])   H/O splenectomy   Presence of permanent cardiac pacemaker   Sleep apnea   Acute low back pain   Chronic bronchitis ([place])   . anastrozole  1 mg Oral Once per day on Mon Thu  . aspirin EC  81 mg Oral Daily  . clopidogrel  300 mg Oral Once  . [START ON 10/25/2018] clopidogrel  75 mg Oral Daily  . enoxaparin (LOVENOX) injection  40 mg Subcutaneous Q24H  . ezetimibe  10 mg Oral Daily  . fluticasone  2 spray Each Nare Daily  . ipratropium-albuterol  3 mL Nebulization TID  . pantoprazole  40 mg Oral Daily  . pravastatin  40 mg Oral q1800  . tamsulosin  0.4 mg Oral QPC breakfast  . thyroid  120 mg Oral QAC breakfast  . thyroid  60 mg Oral QPM    SUBJECTIVE: His back pain is significantly relieved today and he was able to move around and roll/reposition freely in the bed today. He describes that the pain was most severe prior to admission located over the right lower back/upper hip "where the spine meets the hips." He had pulled his back the week before and this happens a few times a year from  time to time but he never felt pain like this before.   He says that he has felt poorly since mid-February when he returned home from [facility]. He has been on 3 rounds of cefdinir from his PCP since this time frame. He states that he recalls feeling the best with most energy and least cough after he completes the 7-10 day course then "it always came back." He has had some weight loss but intentional from what he states with portion control and watching diet.   He has no family history of colon cancer. His last colonoscopy was 3 years ago and self-reports that he had no polyps at that time. He was previously on a Q65yr schedule with  previous polyps removed in his 50s/60s. He was released from his GI team from further screening.   Only concern today includes swelling and painful hands/fingers. He can move and grip hands but it feels stiff for him. He still is having some shortness of breath and requiring oxygen via nasal cannula. He does not normally use oxygen regularly at home prior to admission.    Review of Systems: Review of Systems  Constitutional: Negative for chills and fever.  HENT: Negative for tinnitus.   Eyes: Negative for blurred vision and photophobia.  Respiratory: Positive for cough and shortness of breath. Negative for sputum production.   Cardiovascular: Negative for chest pain and leg swelling.  Gastrointestinal: Negative for diarrhea, nausea and vomiting.  Genitourinary: Negative for dysuria.  Musculoskeletal: Positive for back pain.       Hand/finger swelling and pain  Skin: Negative for rash.  Neurological: Negative for weakness and headaches.    Allergies  Allergen Reactions  . Morphine And Related     Had "crazy dreams" felt "crazy" per pt.  . Bactrim [Sulfamethoxazole-Trimethoprim]   . Statins     OBJECTIVE: Vitals:   10/23/18 2050 10/24/18 0458 10/24/18 0511 10/24/18 0729  BP:   124/68   Pulse:   71   Resp:   18   Temp:   98.6 F (37 C)   TempSrc:   Oral   SpO2: 95%  95% 95%  Weight:  99 kg    Height:       Body mass index is 29.59 kg/m.  Physical Exam Constitutional:      Comments: Seated comfortably in recliner. Smiling and very pleasant.   Cardiovascular:     Rate and Rhythm: Normal rate and regular rhythm.     Heart sounds: No murmur.  Pulmonary:     Effort: Pulmonary effort is normal.     Breath sounds: Decreased breath sounds present. No rhonchi.  Chest:     Chest wall: No tenderness.  Abdominal:     General: Bowel sounds are normal.     Palpations: Abdomen is soft.  Musculoskeletal:     Right lower leg: No edema.     Left lower leg: No edema.  Skin:     General: Skin is warm and dry.     Comments: Palmar erythema, spooning effect to nails. Swelling to hands and fingers/joints with stiffened range of motion.  2+ radial pulses bilaterally.   Neurological:     Mental Status: He is alert.     Lab Results Lab Results  Component Value Date   WBC 24.1 (H) 10/24/2018   HGB 14.4 10/24/2018   HCT 41.7 10/24/2018   MCV 91.6 10/24/2018   PLT 158 10/24/2018    Lab Results  Component Value Date  CREATININE 1.31 (H) 10/24/2018   BUN 23 10/24/2018   NA 139 10/24/2018   K 3.9 10/24/2018   CL 108 10/24/2018   CO2 22 10/24/2018    Lab Results  Component Value Date   ALT 31 10/24/2018   AST 34 10/24/2018   ALKPHOS 43 10/24/2018   BILITOT 1.0 10/24/2018     Microbiology: Recent Results (from the past 240 hour(s))  SARS Coronavirus 2 (CEPHEID- Performed in [facility] [place] hospital lab), Hosp Order     Status: None   Collection Time: 10/22/18 10:47 AM  Result Value Ref Range Status   SARS Coronavirus 2 NEGATIVE NEGATIVE Final    Comment: (NOTE) If result is NEGATIVE SARS-CoV-2 target nucleic acids are NOT DETECTED. The SARS-CoV-2 RNA is generally detectable in upper and lower  respiratory specimens during the acute phase of infection. The lowest  concentration of SARS-CoV-2 viral copies this assay can detect is 250  copies / mL. A negative result does not preclude SARS-CoV-2 infection  and should not be used as the sole basis for treatment or other  patient management decisions.  A negative result may occur with  improper specimen collection / handling, submission of specimen other  than nasopharyngeal swab, presence of viral mutation(s) within the  areas targeted by this assay, and inadequate number of viral copies  (<250 copies / mL). A negative result must be combined with clinical  observations, patient history, and epidemiological information. If result is POSITIVE SARS-CoV-2 target nucleic acids are DETECTED. The SARS-CoV-2 RNA  is generally detectable in upper and lower  respiratory specimens dur ing the acute phase of infection.  Positive  results are indicative of active infection with SARS-CoV-2.  Clinical  correlation with patient history and other diagnostic information is  necessary to determine patient infection status.  Positive results do  not rule out bacterial infection or co-infection with other viruses. If result is PRESUMPTIVE POSTIVE SARS-CoV-2 nucleic acids MAY BE PRESENT.   A presumptive positive result was obtained on the submitted specimen  and confirmed on repeat testing.  While 2019 novel coronavirus  (SARS-CoV-2) nucleic acids may be present in the submitted sample  additional confirmatory testing may be necessary for epidemiological  and / or clinical management purposes  to differentiate between  SARS-CoV-2 and other Sarbecovirus currently known to infect humans.  If clinically indicated additional testing with an alternate test  methodology [phone]) is advised. The SARS-CoV-2 RNA is generally  detectable in upper and lower respiratory sp ecimens during the acute  phase of infection. The expected result is Negative. Fact Sheet for Patients:  StrictlyIdeas.no Fact Sheet for Healthcare Providers: BankingDealers.co.za This test is not yet approved or cleared by the [place] FDA and has been authorized for detection and/or diagnosis of SARS-CoV-2 by FDA under an Emergency Use Authorization (EUA).  This EUA will remain in effect (meaning this test can be used) for the duration of the COVID-19 declaration under Section 564(b)(1) of the Act, 21 U.S.C. section 360bbb-3(b)(1), unless the authorization is terminated or revoked sooner. Performed at [facility], [place] [address], [place], [place] [address]   Blood culture (routine x 2)     Status: Abnormal   Collection Time: 10/22/18 10:52 AM  Result Value Ref Range Status   Specimen  Description BLOOD BLOOD LEFT FOREARM  Final   Special Requests   Final    BOTTLES DRAWN AEROBIC AND ANAEROBIC Blood Culture adequate volume   Culture  Setup Time   Final  IN BOTH AEROBIC AND ANAEROBIC BOTTLES GRAM POSITIVE COCCI CRITICAL RESULT CALLED TO, READ BACK BY AND VERIFIED WITH: J ORIET PHARMD 10/22/18 2300 JDW    Culture (A)  Final    STREPTOCOCCUS GALLOLYTICUS SUSCEPTIBILITIES PERFORMED ON PREVIOUS CULTURE WITHIN THE LAST 5 DAYS. Performed at [facility], [place] [address], [place], [place] [address]    Report Status 10/24/2018 FINAL  Final  Blood Culture ID Panel (Reflexed)     Status: Abnormal   Collection Time: 10/22/18 10:52 AM  Result Value Ref Range Status   Enterococcus species NOT DETECTED NOT DETECTED Final   Listeria monocytogenes NOT DETECTED NOT DETECTED Final   Staphylococcus species NOT DETECTED NOT DETECTED Final   Staphylococcus aureus (BCID) NOT DETECTED NOT DETECTED Final   Streptococcus species DETECTED (A) NOT DETECTED Final    Comment: Not Enterococcus species, Streptococcus agalactiae, Streptococcus pyogenes, or Streptococcus pneumoniae. CRITICAL RESULT CALLED TO, READ BACK BY AND VERIFIED WITH: J ORIET PHARMD 10/22/18 2300 JDW    Streptococcus agalactiae NOT DETECTED NOT DETECTED Final   Streptococcus pneumoniae NOT DETECTED NOT DETECTED Final   Streptococcus pyogenes NOT DETECTED NOT DETECTED Final   Acinetobacter baumannii NOT DETECTED NOT DETECTED Final   Enterobacteriaceae species NOT DETECTED NOT DETECTED Final   Enterobacter cloacae complex NOT DETECTED NOT DETECTED Final   Escherichia coli NOT DETECTED NOT DETECTED Final   Klebsiella oxytoca NOT DETECTED NOT DETECTED Final   Klebsiella pneumoniae NOT DETECTED NOT DETECTED Final   Proteus species NOT DETECTED NOT DETECTED Final   Serratia marcescens NOT DETECTED NOT DETECTED Final   Haemophilus influenzae NOT DETECTED NOT DETECTED Final   Neisseria meningitidis NOT DETECTED NOT DETECTED  Final   Pseudomonas aeruginosa NOT DETECTED NOT DETECTED Final   Candida albicans NOT DETECTED NOT DETECTED Final   Candida glabrata NOT DETECTED NOT DETECTED Final   Candida krusei NOT DETECTED NOT DETECTED Final   Candida parapsilosis NOT DETECTED NOT DETECTED Final   Candida tropicalis NOT DETECTED NOT DETECTED Final    Comment: Performed at [facility], [place] [address] [place], [place] [address]  Urine culture     Status: Abnormal   Collection Time: 10/22/18 11:22 AM  Result Value Ref Range Status   Specimen Description URINE, RANDOM  Final   Special Requests NONE  Final   Culture (A)  Final    <10,000 COLONIES/mL INSIGNIFICANT GROWTH Performed at [facility] [address] [place], [place] [address]    Report Status 10/23/2018 FINAL  Final  Blood culture (routine x 2)     Status: Abnormal   Collection Time: 10/22/18 11:23 AM  Result Value Ref Range Status   Specimen Description BLOOD RIGHT ANTECUBITAL  Final   Special Requests   Final    BOTTLES DRAWN AEROBIC AND ANAEROBIC Blood Culture adequate volume   Culture  Setup Time   Final    IN BOTH AEROBIC AND ANAEROBIC BOTTLES GRAM POSITIVE COCCI CRITICAL VALUE NOTED.  VALUE IS CONSISTENT WITH PREVIOUSLY REPORTED AND CALLED VALUE. Performed at [facility], [place] [address], [place], [place] [address]    Culture STREPTOCOCCUS GALLOLYTICUS (A)  Final   Report Status 10/24/2018 FINAL  Final   Organism ID, Bacteria STREPTOCOCCUS GALLOLYTICUS  Final      Susceptibility   Streptococcus gallolyticus - MIC*    PENICILLIN 0.12 SENSITIVE Sensitive     CEFTRIAXONE <=0.12 SENSITIVE Sensitive     ERYTHROMYCIN <=0.12 SENSITIVE Sensitive     LEVOFLOXACIN 2  SENSITIVE Sensitive     VANCOMYCIN <=0.12 SENSITIVE Sensitive     * STREPTOCOCCUS GALLOLYTICUS  Respiratory Panel by PCR     Status: None   Collection Time: 10/22/18  5:44 PM  Result Value Ref Range Status   Adenovirus NOT DETECTED NOT DETECTED Final   Coronavirus  229E NOT DETECTED NOT DETECTED Final    Comment: (NOTE) The Coronavirus on the Respiratory Panel, DOES NOT test for the novel  Coronavirus (2019 nCoV)    Coronavirus HKU1 NOT DETECTED NOT DETECTED Final   Coronavirus NL63 NOT DETECTED NOT DETECTED Final   Coronavirus OC43 NOT DETECTED NOT DETECTED Final   Metapneumovirus NOT DETECTED NOT DETECTED Final   Rhinovirus / Enterovirus NOT DETECTED NOT DETECTED Final   Influenza A NOT DETECTED NOT DETECTED Final   Influenza B NOT DETECTED NOT DETECTED Final   Parainfluenza Virus 1 NOT DETECTED NOT DETECTED Final   Parainfluenza Virus 2 NOT DETECTED NOT DETECTED Final   Parainfluenza Virus 3 NOT DETECTED NOT DETECTED Final   Parainfluenza Virus 4 NOT DETECTED NOT DETECTED Final   Respiratory Syncytial Virus NOT DETECTED NOT DETECTED Final   Bordetella pertussis NOT DETECTED NOT DETECTED Final   Chlamydophila pneumoniae NOT DETECTED NOT DETECTED Final   Mycoplasma pneumoniae NOT DETECTED NOT DETECTED Final    Comment: Performed at [facility], [place]. [address] [place], [place] [address]  SARS Coronavirus 2 (CEPHEID- Performed in [facility] hospital lab), Hosp Order     Status: None   Collection Time: 10/22/18  5:44 PM  Result Value Ref Range Status   SARS Coronavirus 2 NEGATIVE NEGATIVE Final    Comment: (NOTE) If result is NEGATIVE SARS-CoV-2 target nucleic acids are NOT DETECTED. The SARS-CoV-2 RNA is generally detectable in upper and lower  respiratory specimens during the acute phase of infection. The lowest  concentration of SARS-CoV-2 viral copies this assay can detect is 250  copies / mL. A negative result does not preclude SARS-CoV-2 infection  and should not be used as the sole basis for treatment or other  patient management decisions.  A negative result may occur with  improper specimen collection / handling, submission of specimen other  than nasopharyngeal swab, presence of viral mutation(s) within the  areas targeted  by this assay, and inadequate number of viral copies  (<250 copies / mL). A negative result must be combined with clinical  observations, patient history, and epidemiological information. If result is POSITIVE SARS-CoV-2 target nucleic acids are DETECTED. The SARS-CoV-2 RNA is generally detectable in upper and lower  respiratory specimens dur ing the acute phase of infection.  Positive  results are indicative of active infection with SARS-CoV-2.  Clinical  correlation with patient history and other diagnostic information is  necessary to determine patient infection status.  Positive results do  not rule out bacterial infection or co-infection with other viruses. If result is PRESUMPTIVE POSTIVE SARS-CoV-2 nucleic acids MAY BE PRESENT.   A presumptive positive result was obtained on the submitted specimen  and confirmed on repeat testing.  While 2019 novel coronavirus  (SARS-CoV-2) nucleic acids may be present in the submitted sample  additional confirmatory testing may be necessary for epidemiological  and / or clinical management purposes  to differentiate between  SARS-CoV-2 and other Sarbecovirus currently known to infect humans.  If clinically indicated additional testing with an alternate test  methodology [phone]) is advised. The SARS-CoV-2 RNA is generally  detectable in upper and lower respiratory sp ecimens during the  acute  phase of infection. The expected result is Negative. Fact Sheet for Patients:  StrictlyIdeas.no Fact Sheet for Healthcare Providers: BankingDealers.co.za This test is not yet approved or cleared by the [place] FDA and has been authorized for detection and/or diagnosis of SARS-CoV-2 by FDA under an Emergency Use Authorization (EUA).  This EUA will remain in effect (meaning this test can be used) for the duration of the COVID-19 declaration under Section 564(b)(1) of the Act, 21 U.S.C. section  360bbb-3(b)(1), unless the authorization is terminated or revoked sooner. Performed at [facility], [place] [address] [place], [place] [address]   Urine culture     Status: None   Collection Time: 10/22/18  8:29 PM  Result Value Ref Range Status   Specimen Description URINE, RANDOM  Final   Special Requests NONE  Final   Culture   Final    NO GROWTH Performed at [facility], [place] [address] [place], [place] [address]    Report Status 10/24/2018 FINAL  Final    Janene Madeira, MSN, NP-C [facility] Cell: [phone] Pager: [phone]  @TODAY @ 9:56 AM

## 2018-10-24 NOTE — Progress Notes (Addendum)
PROGRESS NOTE    [name] Glass  PJA:[id number] DOB: 17-Jan-1941 DOA: 10/22/2018 PCP: [name], MD  Brief Narrative:  [name] Glass is [name] Glass 78 y.o. male with history h/o hyperlipidemia, hypertension, CAD status post PTCA x 6, with last stent in January, GERD, arthritis, hiatal hernia, sleep apnea presents to the ED today with complaints of productive cough and dyspnea.  Patient states he has had on and off symptoms of bronchitis and received at least 2 courses of antibiotics within the last 3 months.  On presentation to the ED he was noted to be febrile, tachycardic, dyspneic and hypoxic requiring 2 L of nasal cannula.  Labs revealed elevated lactate at 4, sepsis protocol was initiated.  Patient received IV fluids broad-spectrum antibiotics and underwent COVID-19 work-up as well as CT chest to rule out PE/pneumonia which were all unremarkable.  Patient lives with his wife and denies any sick contacts.  Skin redness noted along his trunk and face which he relates to sunburn.  He denies any chest pain or leg swellings or headache or dizziness.  He does report low back pain and points to right iliac/paraspinal  area.  He does have [name] Glass history of kidney stones but does not feel this pain is like prior episodes and denies any dysuria/hematuria.  He drinks scotch daily but denies any history of alcohol withdrawal.   Patient does have long history of CAD and had 5 stents placed more than 10 years back by [name] Glass. He reports undergoing [name] Glass stress test in [place] at one point which was apparently good.He was in [facility] and had an episode of syncope.He ultimately was transported by EMS to [facility] where he underwent cardiac catheterization for NSTEMI revealing high-grade calcified LAD stenosis requiring DES implantation in Jan 2020.  He has remained on aspirin and Brilinta since then.He also was noted to have long pauses on telemetry with recurrent syncope during that  hospitalization and and underwent permanent pacemaker implantation.  When seen by [name]. Alvester Glass for follow-up on January 28, patient still had pocket staples in.  He currently has tenderness at pacemaker site as well as [name] Glass dry whitish scab (which was also noted at follow-up visit with cardiologist in March).  Patient denies noticing any discharge.He did receive [name] Glass course of abx (doxycycline) in January for cellulitis (apparently had rt forearm cellulitis, rt knee injury from fall at [facility]) and Omnicef in March (for pharyngitis/bronchitis)  Assessment & Plan:   Principal Problem:   Streptococcal Gallolyticus Bacteremia Active Problems:   Coronary artery disease   OA (osteoarthritis) of knee   Gastroesophageal reflux disease without esophagitis   Essential hypertension, benign   Hyperlipidemia with target LDL less than 70   Chronic renal disease, stage 2, mildly decreased glomerular filtration rate (GFR) between 60-89 mL/min/1.73 square meter   Sepsis ([place])   H/O splenectomy   Presence of permanent cardiac pacemaker   Sleep apnea   Acute low back pain   Chronic bronchitis ([place])  1.  Sepsis syndrome secondary to Strep Gallolyticus Bacteremia: - 2/2 blood cx from 5/19 with gram positive cocci -> strep on BCID - repeat blood cx 5/20 pending - ceftriaxone 5/20 - 21 - narrowed to penicillin 5/21 -  - notably, pt with hx of splenectomy  - urine cx <10,000 CFU - Sars Cov 2 testing negative x 2, negative influenza - Pt with tenderness at pacemaker pocket, concern for infection - plan for pacemaker per cardiology - Pt also  has TTP of spine -> follow up MRI T and L spine - notable for mild paraspinous edema of R>L lower posterior paraspinous musculature.  Favored to be reactive.  No discrete soft tissue collections.  Consider myositis or muscular injury.  He does note strain about 1 week ago.  Appreciate ID recommendations. - follow TTE - no report of vegetations -> planning for TEE per  cardiology - ID consult, appreciate recommendations - Appreciate cards recommendations as well, pending TTE, pt may need TEE - Continue to trend lactate to normal - Strep gallolyticus associated with colon cancer -> per ID note, pt up to date with colonoscopy - follow outpatient  2. Acute Hypoxic Respiratory Failure - CT PE protocol negative for PE - initially concern for bronchitis/respiratory viral illness, but suspect sepsis due to his strep bacteremia above - currently on 4 L by [place] - does not appear overloaded, normal BNP - Brilinta has been d/c'd due to his SOB, started on plavix - wean O2 as able  - I/O, daily weights - will repeat CXR given persistent hypoxia  3. CAD S/P PTCA   Elevated Troponin: No c/o chest pain, no acute ST-T changes on EKG but has abnormal troponin. Resume ASA, Brilinta , Zetia, statins (patient on Pitavastatin (non formulary) at home although has statin intolerance listed. Monitor on Pravastatin which is ordered as substitute given recent stent. Cycle CE (troponins elevated, but now downtrending, relatively low, suspect this is 2/2 demand ischemia due to above) and check echo (as noted below). Cardiology evaluating, appreciate recs.  3. 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  GERD/hiatal hernia: Per chart patient is s/p gastric fundoplication and s/p splenectomy. PPI.  5. SSS : S/P pacemaker  6. 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  Improved.  Follow. UA with 100 mg/dl protein, 0-5 RBC.    7. Chronic alcohol abuse: drinks scotch daily. No signs of intoxication/withdrawal. Monitor.   DVT prophylaxis: lovenox Code Status: full  Family Communication: none at bedsdie Disposition Plan: pending further w/u and improvement.  Strep bacteremia with unknown source.  Requires continued ID eval, TEE, IV abx.   Consultants:   ID  Cardiology  Procedures: Echo IMPRESSIONS    1. The left ventricle has low normal  systolic function, with an ejection fraction of 50-55%. The cavity size was normal. There is moderate concentric left ventricular hypertrophy. Left ventricular diastolic Doppler parameters are consistent with  pseudonormalization. Indeterminate filling pressures.  2. The right ventricle has normal systolic function. The cavity was normal. There is no increase in right ventricular wall thickness. Right ventricular systolic pressure could not be assessed.  3. Left atrial size was severely dilated.  4. The aortic valve has an indeterminate number of cusps. Moderate thickening of the aortic valve. Moderate calcification of the aortic valve. Aortic valve regurgitation is mild by color flow Doppler. Mild stenosis of the aortic valve.  5. The inferior vena cava was dilated in size with <50% respiratory variability.   Antimicrobials:  Anti-infectives (From admission, onward)   Start     Dose/Rate Route Frequency Ordered Stop   10/24/18 1200  penicillin G potassium 12 Million Units in dextrose 5 % 500 mL continuous infusion     12 Million Units 41.7 mL/hr over 12 Hours Intravenous Every 12 hours 10/24/18 1019     10/23/18 1200  cefTRIAXone (ROCEPHIN) 2 g in sodium chloride 0.9 % 100 mL IVPB  Status:  Discontinued  2 g 200 mL/hr over 30 Minutes Intravenous Every 24 hours 10/23/18 0902 10/24/18 1019   10/23/18 1000  ceFEPIme (MAXIPIME) 1 g in sodium chloride 0.9 % 100 mL IVPB  Status:  Discontinued     1 g 200 mL/hr over 30 Minutes Intravenous Every 12 hours 10/22/18 2000 10/22/18 2104   10/22/18 2300  ceFEPIme (MAXIPIME) 2 g in sodium chloride 0.9 % 100 mL IVPB  Status:  Discontinued     2 g 200 mL/hr over 30 Minutes Intravenous Every 12 hours 10/22/18 2104 10/23/18 0901   10/22/18 1100  ceFEPIme (MAXIPIME) 2 g in sodium chloride 0.9 % 100 mL IVPB     2 g 200 mL/hr over 30 Minutes Intravenous  Once 10/22/18 1050 10/22/18 1205   10/22/18 1100  vancomycin (VANCOCIN) IVPB 1000 mg/200 mL premix      1,000 mg 200 mL/hr over 60 Minutes Intravenous  Once 10/22/18 1050 10/22/18 1240     Subjective: Feels Reice Bienvenue bit better today. Pain improved.  Objective: Vitals:   10/24/18 0458 10/24/18 0511 10/24/18 0729 10/24/18 1000  BP:  124/68  123/77  Pulse:  71  60  Resp:  18    Temp:  98.6 F (37 C)    TempSrc:  Oral    SpO2:  95% 95% 97%  Weight: 99 kg     Height:        Intake/Output Summary (Last 24 hours) at 10/24/2018 1410 Last data filed at 10/24/2018 1236 Gross per 24 hour  Intake 3013.19 ml  Output 1625 ml  Net 1388.19 ml   Filed Weights   10/22/18 2229 10/23/18 0518 10/24/18 0458  Weight: 96.2 kg 97.5 kg 99 kg    Examination:  General: No acute distress. Cardiovascular: Heart sounds show Amahri Dengel regular rate, and rhythm. Pacemaker pocket without significant TTP or obvious signs of infection. Lungs: Clear to auscultation bilaterally  Abdomen: Soft, nontender, nondistended  Neurological: Alert and oriented 3. Moves all extremities 4. Cranial nerves II through XII grossly intact. Skin: Warm and dry. No rashes or lesions. Extremities:  Mild TTP to R side lumbar spine, moving all extremities Psychiatric: Mood and affect are normal. Insight and judgment are appropriate.   Data Reviewed: I have personally reviewed following labs and imaging studies  CBC: Recent Labs  Lab 10/22/18 1050 10/22/18 2304 10/23/18 0206 10/24/18 0207  WBC 5.0 18.5* 22.1* 24.1*  NEUTROABS 3.7  --   --   --   HGB 18.2* 16.8 15.9 14.4  HCT 54.3* 49.1 46.0 41.7  MCV 93.1 91.9 92.2 91.6  PLT 261 199 177 542   Basic Metabolic Panel: Recent Labs  Lab 10/22/18 1050 10/22/18 2015 10/23/18 0206 10/24/18 0207  NA 141  --  141 139  K 4.4  --  4.3 3.9  CL 108  --  111 108  CO2 17*  --  18* 22  GLUCOSE 162*  --  81 99  BUN 13  --  26* 23  CREATININE 1.24 1.97* 1.93* 1.31*  CALCIUM 10.3  --  9.3 9.0  MG  --   --   --  1.4*   GFR: Estimated Creatinine Clearance: 56.7 mL/min (Baya Lentz) (by C-G formula  based on SCr of 1.31 mg/dL (H)). Liver Function Tests: Recent Labs  Lab 10/22/18 1050 10/24/18 0207  AST 43* 34  ALT 25 31  ALKPHOS 66 43  BILITOT 1.8* 1.0  PROT 7.1 5.3*  ALBUMIN 3.7 2.6*   No results for input(s): LIPASE, AMYLASE  in the last 168 hours. No results for input(s): AMMONIA in the last 168 hours. Coagulation Profile: No results for input(s): INR, PROTIME in the last 168 hours. Cardiac Enzymes: Recent Labs  Lab 10/22/18 1050 10/22/18 2015 10/23/18 0206 10/23/18 0730  TROPONINI 0.07* 0.26* 0.27* 0.20*   BNP (last 3 results) No results for input(s): PROBNP in the last 8760 hours. HbA1C: No results for input(s): HGBA1C in the last 72 hours. CBG: No results for input(s): GLUCAP in the last 168 hours. Lipid Profile: No results for input(s): CHOL, HDL, LDLCALC, TRIG, CHOLHDL, LDLDIRECT in the last 72 hours. Thyroid Function Tests: No results for input(s): TSH, T4TOTAL, FREET4, T3FREE, THYROIDAB in the last 72 hours. Anemia Panel: No results for input(s): VITAMINB12, FOLATE, FERRITIN, TIBC, IRON, RETICCTPCT in the last 72 hours. Sepsis Labs: Recent Labs  Lab 10/23/18 2046 10/23/18 2305 10/24/18 0207 10/24/18 0435  LATICACIDVEN 3.1* 3.2* 2.0* 1.8    Recent Results (from the past 240 hour(s))  SARS Coronavirus 2 (CEPHEID- Performed in [place] [facility]), Hosp Order     Status: None   Collection Time: 10/22/18 10:47 AM  Result Value Ref Range Status   SARS Coronavirus 2 NEGATIVE NEGATIVE Final    Comment: (NOTE) If result is NEGATIVE SARS-CoV-2 target nucleic acids are NOT DETECTED. The SARS-CoV-2 RNA is generally detectable in upper and lower  respiratory specimens during the acute phase of infection. The lowest  concentration of SARS-CoV-2 viral copies this assay can detect is 250  copies / mL. Kyrillos Adams negative result does not preclude SARS-CoV-2 infection  and should not be used as the sole basis for treatment or other  patient management decisions.   Annalynne Ibanez negative result may occur with  improper specimen collection / handling, submission of specimen other  than nasopharyngeal swab, presence of viral mutation(s) within the  areas targeted by this assay, and inadequate number of viral copies  (<250 copies / mL). Johnnathan Hagemeister negative result must be combined with clinical  observations, patient history, and epidemiological information. If result is POSITIVE SARS-CoV-2 target nucleic acids are DETECTED. The SARS-CoV-2 RNA is generally detectable in upper and lower  respiratory specimens dur ing the acute phase of infection.  Positive  results are indicative of active infection with SARS-CoV-2.  Clinical  correlation with patient history and other diagnostic information is  necessary to determine patient infection status.  Positive results do  not rule out bacterial infection or co-infection with other viruses. If result is PRESUMPTIVE POSTIVE SARS-CoV-2 nucleic acids MAY BE PRESENT.   Kagen Kunath presumptive positive result was obtained on the submitted specimen  and confirmed on repeat testing.  While 2019 novel coronavirus  (SARS-CoV-2) nucleic acids may be present in the submitted sample  additional confirmatory testing may be necessary for epidemiological  and / or clinical management purposes  to differentiate between  SARS-CoV-2 and other Sarbecovirus currently known to infect humans.  If clinically indicated additional testing with an alternate test  methodology [phone]) is advised. The SARS-CoV-2 RNA is generally  detectable in upper and lower respiratory sp ecimens during the acute  phase of infection. The expected result is Negative. Fact Sheet for Patients:  StrictlyIdeas.no Fact Sheet for Healthcare Providers: BankingDealers.co.za This test is not yet approved or cleared by the [place] FDA and has been authorized for detection and/or diagnosis of SARS-CoV-2 by FDA under an Emergency Use  Authorization (EUA).  This EUA will remain in effect (meaning this test can be used) for the duration of the COVID-19  declaration under Section 564(b)(1) of the Act, 21 U.S.C. section 360bbb-3(b)(1), unless the authorization is terminated or revoked sooner. Performed at [facility], [place] [name], [place], [place] [address]   Blood culture (routine x 2)     Status: Abnormal   Collection Time: 10/22/18 10:52 AM  Result Value Ref Range Status   Specimen Description BLOOD BLOOD LEFT FOREARM  Final   Special Requests   Final    BOTTLES DRAWN AEROBIC AND ANAEROBIC Blood Culture adequate volume   Culture  Setup Time   Final    IN BOTH AEROBIC AND ANAEROBIC BOTTLES GRAM POSITIVE COCCI CRITICAL RESULT CALLED TO, READ BACK BY AND VERIFIED WITH: J ORIET PHARMD 10/22/18 2300 JDW    Culture (Ridley Schewe)  Final    STREPTOCOCCUS GALLOLYTICUS SUSCEPTIBILITIES PERFORMED ON PREVIOUS CULTURE WITHIN THE LAST 5 DAYS. Performed at [facility], [place] [address] [place], [place] [address]    Report Status 10/24/2018 FINAL  Final  Blood Culture ID Panel (Reflexed)     Status: Abnormal   Collection Time: 10/22/18 10:52 AM  Result Value Ref Range Status   Enterococcus species NOT DETECTED NOT DETECTED Final   Listeria monocytogenes NOT DETECTED NOT DETECTED Final   Staphylococcus species NOT DETECTED NOT DETECTED Final   Staphylococcus aureus (BCID) NOT DETECTED NOT DETECTED Final   Streptococcus species DETECTED (Cordarius Benning) NOT DETECTED Final    Comment: Not Enterococcus species, Streptococcus agalactiae, Streptococcus pyogenes, or Streptococcus pneumoniae. CRITICAL RESULT CALLED TO, READ BACK BY AND VERIFIED WITH: J ORIET PHARMD 10/22/18 2300 JDW    Streptococcus agalactiae NOT DETECTED NOT DETECTED Final   Streptococcus pneumoniae NOT DETECTED NOT DETECTED Final   Streptococcus pyogenes NOT DETECTED NOT DETECTED Final   Acinetobacter baumannii NOT DETECTED NOT DETECTED Final   Enterobacteriaceae species  NOT DETECTED NOT DETECTED Final   Enterobacter cloacae complex NOT DETECTED NOT DETECTED Final   Escherichia coli NOT DETECTED NOT DETECTED Final   Klebsiella oxytoca NOT DETECTED NOT DETECTED Final   Klebsiella pneumoniae NOT DETECTED NOT DETECTED Final   Proteus species NOT DETECTED NOT DETECTED Final   Serratia marcescens NOT DETECTED NOT DETECTED Final   Haemophilus influenzae NOT DETECTED NOT DETECTED Final   Neisseria meningitidis NOT DETECTED NOT DETECTED Final   Pseudomonas aeruginosa NOT DETECTED NOT DETECTED Final   Candida albicans NOT DETECTED NOT DETECTED Final   Candida glabrata NOT DETECTED NOT DETECTED Final   Candida krusei NOT DETECTED NOT DETECTED Final   Candida parapsilosis NOT DETECTED NOT DETECTED Final   Candida tropicalis NOT DETECTED NOT DETECTED Final    Comment: Performed at [facility], [place] [address] [place], [place] [address]  Urine culture     Status: Abnormal   Collection Time: 10/22/18 11:22 AM  Result Value Ref Range Status   Specimen Description URINE, RANDOM  Final   Special Requests NONE  Final   Culture (Ozie Lupe)  Final    <10,000 COLONIES/mL INSIGNIFICANT GROWTH Performed at [place] [address] [place], [place] [address]    Report Status 10/23/2018 FINAL  Final  Blood culture (routine x 2)     Status: Abnormal   Collection Time: 10/22/18 11:23 AM  Result Value Ref Range Status   Specimen Description BLOOD RIGHT ANTECUBITAL  Final   Special Requests   Final    BOTTLES DRAWN AEROBIC AND ANAEROBIC Blood Culture adequate volume   Culture  Setup Time   Final    IN BOTH AEROBIC  AND ANAEROBIC BOTTLES GRAM POSITIVE COCCI CRITICAL VALUE NOTED.  VALUE IS CONSISTENT WITH PREVIOUSLY REPORTED AND CALLED VALUE. Performed at [facility], [place] [address], [place], [place] [address]    Culture STREPTOCOCCUS GALLOLYTICUS (Lenay Lovejoy)  Final   Report Status 10/24/2018 FINAL  Final   Organism ID, Bacteria STREPTOCOCCUS GALLOLYTICUS  Final       Susceptibility   Streptococcus gallolyticus - MIC*    PENICILLIN 0.12 SENSITIVE Sensitive     CEFTRIAXONE <=0.12 SENSITIVE Sensitive     ERYTHROMYCIN <=0.12 SENSITIVE Sensitive     LEVOFLOXACIN 2 SENSITIVE Sensitive     VANCOMYCIN <=0.12 SENSITIVE Sensitive     * STREPTOCOCCUS GALLOLYTICUS  Respiratory Panel by PCR     Status: None   Collection Time: 10/22/18  5:44 PM  Result Value Ref Range Status   Adenovirus NOT DETECTED NOT DETECTED Final   Coronavirus 229E NOT DETECTED NOT DETECTED Final    Comment: (NOTE) The Coronavirus on the Respiratory Panel, DOES NOT test for the novel  Coronavirus (2019 nCoV)    Coronavirus HKU1 NOT DETECTED NOT DETECTED Final   Coronavirus NL63 NOT DETECTED NOT DETECTED Final   Coronavirus OC43 NOT DETECTED NOT DETECTED Final   Metapneumovirus NOT DETECTED NOT DETECTED Final   Rhinovirus / Enterovirus NOT DETECTED NOT DETECTED Final   Influenza Kristoffer Bala NOT DETECTED NOT DETECTED Final   Influenza B NOT DETECTED NOT DETECTED Final   Parainfluenza Virus 1 NOT DETECTED NOT DETECTED Final   Parainfluenza Virus 2 NOT DETECTED NOT DETECTED Final   Parainfluenza Virus 3 NOT DETECTED NOT DETECTED Final   Parainfluenza Virus 4 NOT DETECTED NOT DETECTED Final   Respiratory Syncytial Virus NOT DETECTED NOT DETECTED Final   Bordetella pertussis NOT DETECTED NOT DETECTED Final   Chlamydophila pneumoniae NOT DETECTED NOT DETECTED Final   Mycoplasma pneumoniae NOT DETECTED NOT DETECTED Final    Comment: Performed at [facility], [place]. [address], [place],  [address]  SARS Coronavirus 2 (CEPHEID- Performed in [place] hospital lab), Hosp Order     Status: None   Collection Time: 10/22/18  5:44 PM  Result Value Ref Range Status   SARS Coronavirus 2 NEGATIVE NEGATIVE Final    Comment: (NOTE) If result is NEGATIVE SARS-CoV-2 target nucleic acids are NOT DETECTED. The SARS-CoV-2 RNA is generally detectable in upper and lower  respiratory specimens during  the acute phase of infection. The lowest  concentration of SARS-CoV-2 viral copies this assay can detect is 250  copies / mL. Daron Stutz negative result does not preclude SARS-CoV-2 infection  and should not be used as the sole basis for treatment or other  patient management decisions.  Erick Oxendine negative result may occur with  improper specimen collection / handling, submission of specimen other  than nasopharyngeal swab, presence of viral mutation(s) within the  areas targeted by this assay, and inadequate number of viral copies  (<250 copies / mL). Jeran Hiltz negative result must be combined with clinical  observations, patient history, and epidemiological information. If result is POSITIVE SARS-CoV-2 target nucleic acids are DETECTED. The SARS-CoV-2 RNA is generally detectable in upper and lower  respiratory specimens dur ing the acute phase of infection.  Positive  results are indicative of active infection with SARS-CoV-2.  Clinical  correlation with patient history and other diagnostic information is  necessary to determine patient infection status.  Positive results do  not rule out bacterial infection or co-infection with other viruses. If result is PRESUMPTIVE POSTIVE SARS-CoV-2 nucleic  acids MAY BE PRESENT.   Mekhi Sonn presumptive positive result was obtained on the submitted specimen  and confirmed on repeat testing.  While 2019 novel coronavirus  (SARS-CoV-2) nucleic acids may be present in the submitted sample  additional confirmatory testing may be necessary for epidemiological  and / or clinical management purposes  to differentiate between  SARS-CoV-2 and other Sarbecovirus currently known to infect humans.  If clinically indicated additional testing with an alternate test  methodology [phone]) is advised. The SARS-CoV-2 RNA is generally  detectable in upper and lower respiratory sp ecimens during the acute  phase of infection. The expected result is Negative. Fact Sheet for Patients:   StrictlyIdeas.no Fact Sheet for Healthcare Providers: BankingDealers.co.za This test is not yet approved or cleared by the [place] FDA and has been authorized for detection and/or diagnosis of SARS-CoV-2 by FDA under an Emergency Use Authorization (EUA).  This EUA will remain in effect (meaning this test can be used) for the duration of the COVID-19 declaration under Section 564(b)(1) of the Act, 21 U.S.C. section 360bbb-3(b)(1), unless the authorization is terminated or revoked sooner. Performed at [facility], [place] [address] [place], [place] [address]   Urine culture     Status: None   Collection Time: 10/22/18  8:29 PM  Result Value Ref Range Status   Specimen Description URINE, RANDOM  Final   Special Requests NONE  Final   Culture   Final    NO GROWTH Performed at [facility], [place] [address], [place], [place] [address]    Report Status 10/24/2018 FINAL  Final  Culture, blood (routine x 2)     Status: None (Preliminary result)   Collection Time: 10/23/18  7:45 AM  Result Value Ref Range Status   Specimen Description BLOOD LEFT ANTECUBITAL  Final   Special Requests   Final    BOTTLES DRAWN AEROBIC ONLY Blood Culture adequate volume   Culture   Final    NO GROWTH 1 DAY Performed at [facility], [place] [name], [place], [place] [address]    Report Status PENDING  Incomplete  Culture, blood (routine x 2)     Status: None (Preliminary result)   Collection Time: 10/23/18  7:53 AM  Result Value Ref Range Status   Specimen Description BLOOD RIGHT HAND  Final   Special Requests   Final    BOTTLES DRAWN AEROBIC ONLY Blood Culture adequate volume   Culture   Final    NO GROWTH 1 DAY Performed at [facility], [place] [address], [place], [place] [address]    Report Status PENDING  Incomplete         Radiology Studies: Ct Angio Chest Pe W And/or Wo Contrast  Result Date: 10/22/2018 CLINICAL DATA:   Shortness of breath, chest tightness EXAM: CT ANGIOGRAPHY CHEST WITH CONTRAST TECHNIQUE: Multidetector CT imaging of the chest was performed using the standard protocol during bolus administration of intravenous contrast. Multiplanar CT image reconstructions and MIPs were obtained to evaluate the vascular anatomy. CONTRAST:  120mL OMNIPAQUE IOHEXOL 350 MG/ML SOLN COMPARISON:  None. FINDINGS: Cardiovascular: Satisfactory opacification of the pulmonary arteries to the segmental level. No evidence of pulmonary embolism. Cardiomegaly. Extensive 3 vessel coronary artery calcifications and/or stents. Left chest multi lead pacer. No pericardial effusion. Mediastinum/Nodes: No enlarged mediastinal, hilar, or axillary lymph nodes. Thyroid gland, trachea, and esophagus demonstrate no significant findings. Lungs/Pleura: Bibasilar scarring or atelectasis. No pleural effusion or pneumothorax. Upper Abdomen: No acute  abnormality. Postoperative findings of splenectomy. Musculoskeletal: No chest wall abnormality. No acute or significant osseous findings. Review of the MIP images confirms the above findings. IMPRESSION: 1.  Negative examination for pulmonary embolism. 2.  Bibasilar scarring or atelectasis. 3.  Cardiomegaly and coronary artery disease. Electronically Signed   By: Eddie Candle M.D.   On: 10/22/2018 16:14   Mr Thoracic Spine Wo Contrast  Result Date: 10/23/2018 CLINICAL DATA:  Initial evaluation for acute sepsis. EXAM: MRI THORACIC AND LUMBAR SPINE WITHOUT CONTRAST TECHNIQUE: Multiplanar and multiecho pulse sequences of the thoracic and lumbar spine were obtained without intravenous contrast. COMPARISON:  None available. FINDINGS: MRI THORACIC SPINE FINDINGS Alignment: Market dextroscoliosis of the midthoracic spine. Grade 1 anterolisthesis of T2 on T3, likely chronic and degenerative. Alignment otherwise normal with preservation of the normal thoracic kyphosis. Vertebrae: Vertebral body heights maintained without  evidence for acute or chronic fracture. Few scattered chronic endplate Schmorl's nodes noted within the lower thoracic spine. Bone marrow signal intensity within normal limits. No discrete or worrisome osseous lesions. No evidence for osteomyelitis discitis or septic arthritis. Cord: Signal intensity within the thoracic spinal cord is normal. Normal cord caliber and morphology. No epidural collections. Paraspinal and other soft tissues: Paraspinous soft tissues demonstrate no acute finding. Small layering right pleural effusion with associated atelectasis. Visualized visceral structures grossly within normal limits. Disc levels: T1-2: Bilateral facet hypertrophy, greater on the right. No significant stenosis. T2-3: Grade 1 anterolisthesis. Bilateral facet hypertrophy. No spinal stenosis. Mild right foraminal narrowing. T3-4:  Negative interspace.  Mild facet hypertrophy.  No stenosis. T4-5: Negative interspace. Right greater than left facet hypertrophy. No significant stenosis. T5-6:  Negative interspace.  Mild facet hypertrophy.  No stenosis. T6-7: Negative interspace. Mild facet and ligament flavum hypertrophy. No significant stenosis. T7-8:  Negative interspace.  Mild facet hypertrophy.  No stenosis. T8-9:  Mild facet hypertrophy.  No stenosis. T9-10: Bilateral facet hypertrophy with ankylosis. No significant stenosis. T10-11:  Posterior element hypertrophy.  No stenosis. T11-12:  Bilateral facet hypertrophy.  No stenosis. T12-L1:  Negative. 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 L5-S1 disc somewhat rudimentary. Alignment: Trace 2 mm anterolisthesis of L3 on L4. Mild levoscoliosis. Alignment otherwise normal with preservation of the normal lumbar lordosis. Vertebrae: Vertebral body heights maintained without evidence for acute or chronic fracture. Bone marrow signal intensity mildly heterogeneous but within normal limits. No  worrisome osseous lesions. Minimal reactive marrow edema about the bilateral facets at L2-3 through L4-5, likely due to facet arthritis, greater on the right. No evidence for osteomyelitis or discitis. Partially visualized SI joints within normal limits. Conus medullaris and cauda equina: Conus extends to the L1 level. Conus and cauda equina appear normal. Paraspinal and other soft tissues: Mild edema seen involving the right greater than left lower posterior paraspinous musculature (series 27, image 5), likely reactive in nature due to adjacent facet degeneration. Possible muscular strain and/or injury or acute myositis could also be considered. No loculated collections. Paraspinous soft tissues demonstrate no other acute finding. Approximate 1 cm right renal cyst noted. Atherosclerotic change noted within the intra-abdominal aorta. Visualized visceral structures otherwise unremarkable. Disc levels: L1-2:  Unremarkable. L2-3: Mild disc bulge with disc desiccation. Moderate right with mild left facet hypertrophy. Superimposed 14 mm synovial cyst noted at the lateral aspect of the right L2-3 facet (series 29, image 10). This exhibits no mass effect on the adjacent thecal sac. Thecal sac. Resultant mild canal with right lateral  recess narrowing. Mild right L2 foraminal stenosis. L3-4: Trace anterolisthesis. Mild disc bulge with disc desiccation. Moderate bilateral facet hypertrophy. Resultant mild canal with bilateral lateral recess stenosis, slightly worse on the right. Mild bilateral foraminal narrowing. L4-5: Disc desiccation with minimal annular bulge. Moderate facet hypertrophy. No significant stenosis. L5-S1: Transitional lumbosacral anatomy with rudimentary L5-S1 disc. Minimal endplate osteophytic spurring without significant disc bulge. Mild facet hypertrophy. No significant stenosis. IMPRESSION: MR THORACIC SPINE IMPRESSION 1. No evidence for acute infection or other abnormality within the thoracic spine. 2.  Moderate thoracic dextroscoliosis with associated multilevel facet hypertrophy as above. No significant spinal stenosis. Mild right foraminal narrowing at T2-3. 3. Small layering right pleural effusion with associated atelectasis. MR LUMBAR SPINE IMPRESSION 1. Mild paraspinous edema involving the right greater than left lower posterior paraspinous musculature, favored to be reactive in nature due to adjacent facet degeneration. Finding could result in right-sided lower back pain. Sequelae of muscular injury and/or strain or possibly myositis could also be considered. No discrete soft tissue collections. 2. No other evidence for acute infection within the lumbar spine. No evidence for osteomyelitis or discitis. No epidural collections. 3. Mild for age multilevel disc bulging with moderate facet hypertrophy at L2-3 through L4-5 as above. Resultant mild canal with bilateral lateral recess narrowing at L2-3 and L3-4. Electronically Signed   By: Jeannine Boga M.D.   On: 10/23/2018 19:23   Mr Lumbar Spine Wo Contrast  Result Date: 10/23/2018 CLINICAL DATA:  Initial evaluation for acute sepsis. EXAM: MRI THORACIC AND LUMBAR SPINE WITHOUT CONTRAST TECHNIQUE: Multiplanar and multiecho pulse sequences of the thoracic and lumbar spine were obtained without intravenous contrast. COMPARISON:  None available. FINDINGS: MRI THORACIC SPINE FINDINGS Alignment: Market dextroscoliosis of the midthoracic spine. Grade 1 anterolisthesis of T2 on T3, likely chronic and degenerative. Alignment otherwise normal with preservation of the normal thoracic kyphosis. Vertebrae: Vertebral body heights maintained without evidence for acute or chronic fracture. Few scattered chronic endplate Schmorl's nodes noted within the lower thoracic spine. Bone marrow signal intensity within normal limits. No discrete or worrisome osseous lesions. No evidence for osteomyelitis discitis or septic arthritis. Cord: Signal intensity within the thoracic  spinal cord is normal. Normal cord caliber and morphology. No epidural collections. Paraspinal and other soft tissues: Paraspinous soft tissues demonstrate no acute finding. Small layering right pleural effusion with associated atelectasis. Visualized visceral structures grossly within normal limits. Disc levels: T1-2: Bilateral facet hypertrophy, greater on the right. No significant stenosis. T2-3: Grade 1 anterolisthesis. Bilateral facet hypertrophy. No spinal stenosis. Mild right foraminal narrowing. T3-4:  Negative interspace.  Mild facet hypertrophy.  No stenosis. T4-5: Negative interspace. Right greater than left facet hypertrophy. No significant stenosis. T5-6:  Negative interspace.  Mild facet hypertrophy.  No stenosis. T6-7: Negative interspace. Mild facet and ligament flavum hypertrophy. No significant stenosis. T7-8:  Negative interspace.  Mild facet hypertrophy.  No stenosis. T8-9:  Mild facet hypertrophy.  No stenosis. T9-10: Bilateral facet hypertrophy with ankylosis. No significant stenosis. T10-11:  Posterior element hypertrophy.  No stenosis. T11-12:  Bilateral facet hypertrophy.  No stenosis. T12-L1:  Negative. 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 L5-S1 disc somewhat rudimentary. Alignment: Trace 2 mm anterolisthesis of L3 on L4. Mild levoscoliosis. Alignment otherwise normal with preservation of the normal lumbar lordosis. Vertebrae: Vertebral body heights maintained without evidence for acute or chronic fracture. Bone marrow signal intensity mildly heterogeneous but within normal limits. No worrisome osseous lesions. Minimal reactive  marrow edema about the bilateral facets at L2-3 through L4-5, likely due to facet arthritis, greater on the right. No evidence for osteomyelitis or discitis. Partially visualized SI joints within normal limits. Conus medullaris and cauda equina: Conus extends to the L1 level.  Conus and cauda equina appear normal. Paraspinal and other soft tissues: Mild edema seen involving the right greater than left lower posterior paraspinous musculature (series 27, image 5), likely reactive in nature due to adjacent facet degeneration. Possible muscular strain and/or injury or acute myositis could also be considered. No loculated collections. Paraspinous soft tissues demonstrate no other acute finding. Approximate 1 cm right renal cyst noted. Atherosclerotic change noted within the intra-abdominal aorta. Visualized visceral structures otherwise unremarkable. Disc levels: L1-2:  Unremarkable. L2-3: Mild disc bulge with disc desiccation. Moderate right with mild left facet hypertrophy. Superimposed 14 mm synovial cyst noted at the lateral aspect of the right L2-3 facet (series 29, image 10). This exhibits no mass effect on the adjacent thecal sac. Thecal sac. Resultant mild canal with right lateral recess narrowing. Mild right L2 foraminal stenosis. L3-4: Trace anterolisthesis. Mild disc bulge with disc desiccation. Moderate bilateral facet hypertrophy. Resultant mild canal with bilateral lateral recess stenosis, slightly worse on the right. Mild bilateral foraminal narrowing. L4-5: Disc desiccation with minimal annular bulge. Moderate facet hypertrophy. No significant stenosis. L5-S1: Transitional lumbosacral anatomy with rudimentary L5-S1 disc. Minimal endplate osteophytic spurring without significant disc bulge. Mild facet hypertrophy. No significant stenosis. IMPRESSION: MR THORACIC SPINE IMPRESSION 1. No evidence for acute infection or other abnormality within the thoracic spine. 2. Moderate thoracic dextroscoliosis with associated multilevel facet hypertrophy as above. No significant spinal stenosis. Mild right foraminal narrowing at T2-3. 3. Small layering right pleural effusion with associated atelectasis. MR LUMBAR SPINE IMPRESSION 1. Mild paraspinous edema involving the right greater than left  lower posterior paraspinous musculature, favored to be reactive in nature due to adjacent facet degeneration. Finding could result in right-sided lower back pain. Sequelae of muscular injury and/or strain or possibly myositis could also be considered. No discrete soft tissue collections. 2. No other evidence for acute infection within the lumbar spine. No evidence for osteomyelitis or discitis. No epidural collections. 3. Mild for age multilevel disc bulging with moderate facet hypertrophy at L2-3 through L4-5 as above. Resultant mild canal with bilateral lateral recess narrowing at L2-3 and L3-4. Electronically Signed   By: Jeannine Boga M.D.   On: 10/23/2018 19:23        Scheduled Meds:  anastrozole  1 mg Oral Once per day on Mon Thu   aspirin EC  81 mg Oral Daily   [START ON 10/25/2018] clopidogrel  75 mg Oral Daily   enoxaparin (LOVENOX) injection  40 mg Subcutaneous Q24H   ezetimibe  10 mg Oral Daily   fluticasone  2 spray Each Nare Daily   ipratropium-albuterol  3 mL Nebulization TID   pantoprazole  40 mg Oral Daily   pravastatin  40 mg Oral q1800   tamsulosin  0.4 mg Oral QPC breakfast   thyroid  120 mg Oral QAC breakfast   thyroid  60 mg Oral QPM   Continuous Infusions:  penicillin g continuous IV infusion 12 Million Units (10/24/18 1252)     LOS: 2 days    Time spent: over 30 min    Fayrene Helper, MD [facility] Pager AMION  If 7PM-7AM, please contact night-coverage [email or web address] Password [email or web address] 10/24/2018, 2:10 PM

## 2018-10-24 NOTE — Consult Note (Addendum)
Cardiology Consultation:   Patient ID: MOSIE ANGUS MRN: [id number]; DOB: 09-Feb-1941  Admit date: 10/22/2018 Date of Consult: 10/24/2018  Primary Care Provider: [name], MD Primary Cardiologist: Dr. [name] Primary Electrophysiologist:  Dr. Caryl Comes   Patient Profile:   GRANVILLE WHITEFIELD is a 78 y.o. male with a hx of CAD (number of stents over the years, most recent PCI , HTN, HLD, hypothyroidism, and sinus node dysfunction w/PPM who is being seen today for the evaluation of concerns for PPM infection at the request of Dr. Angelena Form.  History of Present Illness:   Mr. Russomanno while in [place] suffered a syncopal event, was hospitalized underwent catheterization atherectomy of his LAD with a DES placement. He also has sinus node dysfunction underwent pacing. Device information MDT dual chamber PPM implanted 06/22/2018 Dr. Caryl Comes saw the pt in Jan to establish EP/Pacer follow up, described his wound as healing, not completely and removed every other staple, f/u visit with removal of the rest of the staples, Right medial side of incision has a small scab and overlap of skin, f/u 2/18 noted good healing. 10/04/2018 patient called with concerns of area medial to his incision that was red, firm and painful, planned to come in for Dr. Lovena Le to assess.  He came in, there was no drainage, no reports of fever/chills, was advised if he developed any to notify us with a 4 week f/u placed  He was admitted to [facility] 10/22/2018 with complaints of productive cough and dyspnea.  Patient states he has had on and off symptoms of bronchitis and received at least 2 courses of antibiotics within the last 3 months.  On presentation to the ED he was noted to be febrile, tachycardic, dyspneic and hypoxic requiring 2 L of nasal cannula.  Labs revealed elevated lactate at 4, sepsis protocol was initiated.  Patient received IV fluids broad-spectrum antibiotics and underwent COVID-19 work-up as well as CT chest to rule  out PE/pneumonia which were all unremarkable. He was admitted with Sepsis syndrome with acute bronchitis/ presumptive respiratory illness and acute hypoxia present on admission  Cardiology and ID were consulted to his case yesterday with blood cultures coming back + for strep.  ID has concerns for infected PPM pocket and worries that it may be complicated by acute lumbar vertebral infection with c/o low back pain  Blood cultures (2 of 2) GRAM POSITIVE COCCI, STREPTOCOCCUS GALLOLYTICUS TTE yesterday w/LVEF 50-55% (no mention of vegetations) MRI thoracic/lumbar spine  MR LUMBAR SPINE IMPRESSION 1. Mild paraspinous edema involving the right greater than left lower posterior paraspinous musculature, favored to be reactive in nature due to adjacent facet degeneration. Finding could result in right-sided lower back pain. Sequelae of muscular injury and/or strain or possibly myositis could also be considered. No discrete soft tissue collections. 2. No other evidence for acute infection within the lumbar spine. No evidence for osteomyelitis or discitis. No epidural collections  Thoracic spine: 1. No evidence for acute infection or other abnormality within the thoracic spine  EP is asked to the case with concerns of device pocket infection. Carelink transmission from yesterday (pre-MRI) Battery and lead measurements are good He was AVpaced yesterday AP% 56.7% VP 5.6%  Programmed AAIR<=>DDDR  Past Medical History:  Diagnosis Date   Arthritis    Coronary artery disease    GERD (gastroesophageal reflux disease)    H/O bronchitis    H/O hiatal hernia    History of kidney stones last in 1976   Hyperlipidemia  statin intolerant, under control   Hypertension    under control   Hypothyroidism    Sleep apnea    hx of, surgery to reverse    Past Surgical History:  Procedure Laterality Date   CARDIAC CATHETERIZATION Right 2008   5 heart stents   CATARACT EXTRACTION  Bilateral 6 years ago   CYSTOSCOPY N/A 08/06/2012   Procedure: CYSTOSCOPY;  Surgeon: [name], MD;  Location: [facility];  Service: Urology;  Laterality: N/A;   ESOPHAGOGASTRIC FUNDOPLICATION  4008   spleen removed, 5 pints of blood given   [place]   x4   INGUINAL HERNIA REPAIR  1992   PACEMAKER INSERTION     PROSTATE ABLATION  2013   "laser ablation"   TONSILLECTOMY  as child   TOTAL KNEE ARTHROPLASTY Right 08/03/2014   Procedure: TOTAL RIGHT KNEE ARTHROPLASTY;  Surgeon: [name], MD;  Location: [facility];  Service: Orthopedics;  Laterality: Right;   TRANSURETHRAL RESECTION OF PROSTATE N/A 08/06/2012   Procedure: Almyra Free OF PROSTATE WITH GYRUS ;  Surgeon: [name], MD;  Location: [facility];  Service: Urology;  Laterality: N/A;   UMBILICAL HERNIA REPAIR  1990   UVULECTOMY  2005   for sleep apnea   UVULOPALATOPHARYNGOPLASTY  6 years ago     Home Medications:  Prior to Admission medications   Medication Sig Start Date End Date Taking? Authorizing Provider  acetaminophen (TYLENOL) 650 MG CR tablet Take 1,300 mg by mouth at bedtime. Reported on 12/16/2015   Yes [provider]  acidophilus (RISAQUAD) CAPS capsule Take 1 capsule by mouth 2 (two) times daily.   Yes [provider]  anastrozole (ARIMIDEX) 1 MG tablet Take 1 mg by mouth 2 (two) times a week.  10/06/15  Yes [provider]  aspirin EC 81 MG tablet Take 1 tablet (81 mg total) by mouth daily. 07/30/17  Yes [name], MD  BRILINTA 90 MG TABS tablet Take 90 mg by mouth 2 (two) times daily.  06/23/18  Yes [provider]  DEXILANT 60 MG capsule Take 1 capsule by mouth daily. 11/29/15  Yes [provider]  ezetimibe (ZETIA) 10 MG tablet TAKE 1 TABLET BY MOUTH EVERY DAY Patient taking differently: Take 10 mg by mouth daily.  05/17/18  Yes [name], MD  fluticasone (FLONASE) 50 MCG/ACT nasal spray USE DAILY AS DIRECTED Patient taking differently: Place 2  sprays into both nostrils daily.  03/03/18  Yes [name], MD  liothyronine (CYTOMEL) 25 MCG tablet Take 25 mcg by mouth every morning.  05/07/17  Yes [provider]  Nutritional Supplements (DHEA PO) Take 25 mg by mouth.   Yes [provider]  potassium gluconate 595 MG TABS Take 595 mg by mouth every evening.   Yes [provider]  tamsulosin (FLOMAX) 0.4 MG CAPS capsule Take 0.4 mg by mouth daily after breakfast.  02/05/18  Yes [provider]  thyroid (ARMOUR) 60 MG tablet Take 60-120 mg by mouth 2 (two) times daily.    Yes [provider]  acyclovir (ZOVIRAX) 200 MG capsule TAKE 1 CAPSULE (200 MG TOTAL) BY MOUTH 3 (THREE) TIMES DAILY. Patient not taking: Reported on 10/22/2018 08/29/18   [name], MD  cefdinir (OMNICEF) 300 MG capsule TAKE 1 CAPSULE BY MOUTH TWICE A DAY FOR 10 DAYS Patient not taking: Reported on 10/22/2018 09/03/18   [name], MD  Pitavastatin Calcium (LIVALO) 4 MG TABS Take  1 tablet (4 mg total) by mouth daily. Patient not taking: Reported on 10/22/2018 06/27/18   [name], MD    Inpatient Medications: Scheduled Meds:  anastrozole  1 mg Oral Once per day on Mon Thu   aspirin EC  81 mg Oral Daily   [START ON 10/25/2018] clopidogrel  75 mg Oral Daily   enoxaparin (LOVENOX) injection  40 mg Subcutaneous Q24H   ezetimibe  10 mg Oral Daily   fluticasone  2 spray Each Nare Daily   ipratropium-albuterol  3 mL Nebulization TID   pantoprazole  40 mg Oral Daily   pravastatin  40 mg Oral q1800   tamsulosin  0.4 mg Oral QPC breakfast   thyroid  120 mg Oral QAC breakfast   thyroid  60 mg Oral QPM   Continuous Infusions:  penicillin g continuous IV infusion     PRN Meds: acetaminophen, albuterol, HYDROmorphone (DILAUDID) injection, ondansetron **OR** ondansetron (ZOFRAN) IV, oxyCODONE  Allergies:    Allergies  Allergen Reactions   Morphine And Related     Had "crazy dreams" felt "crazy" per  pt.   Bactrim [Sulfamethoxazole-Trimethoprim]    Statins     Social History:   Social History   Socioeconomic History   Marital status: Divorced    Spouse name: Not on file   Number of children: 0   Years of education: Not on file   Highest education level: Not on file  Occupational History   Occupation: Retired   Scientist, product/process development strain: Not on file   Food insecurity:    Worry: Not on file    Inability: Not on file   Transportation needs:    Medical: Not on file    Non-medical: Not on file  Tobacco Use   Smoking status: Never Smoker   Smokeless tobacco: Never Used  Substance and Sexual Activity   Alcohol use: Yes    Alcohol/week: 4.0 standard drinks    Types: 4 Standard drinks or equivalent per week    Comment: 3 drinks at night    Drug use: No   Sexual activity: Not on file  Lifestyle   Physical activity:    Days per week: Not on file    Minutes per session: Not on file   Stress: Not on file  Relationships   Social connections:    Talks on phone: Not on file    Gets together: Not on file    Attends religious service: Not on file    Active member of club or organization: Not on file    Attends meetings of clubs or organizations: Not on file    Relationship status: Not on file   Intimate partner violence:    Fear of current or ex partner: Not on file    Emotionally abused: Not on file    Physically abused: Not on file    Forced sexual activity: Not on file  Other Topics Concern   Not on file  Social History Narrative   Not on file    Family History:   Family History  Problem Relation Age of Onset   Heart attack Father 73       lived to 67   Alzheimer's disease Mother    Stroke Mother    Breast cancer Mother    Alzheimer's disease Maternal Grandmother    Stroke Maternal Grandmother    Lung cancer Maternal Grandmother    Lung cancer Maternal Grandfather    Cancer Paternal Grandmother  type  unknown   Diabetes Paternal Grandfather    Other Paternal Grandfather        Growth in the back of the head     ROS:  Please see the history of present illness.  All other ROS reviewed and negative.     Physical Exam/Data:   Vitals:   10/24/18 0458 10/24/18 0511 10/24/18 0729 10/24/18 1000  BP:  124/68  123/77  Pulse:  71  60  Resp:  18    Temp:  98.6 F (37 C)    TempSrc:  Oral    SpO2:  95% 95% 97%  Weight: 99 kg     Height:        Intake/Output Summary (Last 24 hours) at 10/24/2018 1023 Last data filed at 10/24/2018 1015 Gross per 24 hour  Intake 2873.19 ml  Output 1350 ml  Net 1523.19 ml   Last 3 Weights 10/24/2018 10/23/2018 10/22/2018  Weight (lbs) 218 lb 3.2 oz 215 lb 212 lb 1.6 oz  Weight (kg) 98.975 kg 97.523 kg 96.208 kg     Body mass index is 29.59 kg/m.  General:  Well nourished, well developed, in no acute distress HEENT: normal Lymph: not examined Neck: no JVD Endocrine:  No thryomegaly Vascular: No carotid bruits Cardiac:   RRR; soft SM, no gallops or rubs Lungs:  CTA, no wheezing, rhonchi or rales, appears winded with talking Abd: not examined  Ext: no edema Musculoskeletal:  No deformities Skin: generalized erythema, PPM site has small dark spot middle of incision, no fluid, no pain, no tethering Neuro:   No gross focal abnormalities noted Psych:  Normal affect   EKG:  The EKG was personally reviewed and demonstrates:   #1 ST 127bpm, RBBB #2 ST 130bpm, RBBB #3 SR, 96bpm  07/02/18: AV paced  Telemetry:  Telemetry was personally reviewed and demonstrates:   SR, often AP/VS, some pacing I the ventricle as well  Relevant CV Studies:  10/23/2018: TTE IMPRESSIONS  1. The left ventricle has low normal systolic function, with an ejection fraction of 50-55%. The cavity size was normal. There is moderate concentric left ventricular hypertrophy. Left ventricular diastolic Doppler parameters are consistent with  pseudonormalization. Indeterminate  filling pressures.  2. The right ventricle has normal systolic function. The cavity was normal. There is no increase in right ventricular wall thickness. Right ventricular systolic pressure could not be assessed.  3. Left atrial size was severely dilated.  4. The aortic valve has an indeterminate number of cusps. Moderate thickening of the aortic valve. Moderate calcification of the aortic valve. Aortic valve regurgitation is mild by color flow Doppler. Mild stenosis of the aortic valve.  5. The inferior vena cava was dilated in size with <50% respiratory variability.  FINDINGS  Left Ventricle: The left ventricle has low normal systolic function, with an ejection fraction of 50-55%. The cavity size was normal. There is moderate concentric left ventricular hypertrophy. Left ventricular diastolic Doppler parameters are consistent  with pseudonormalization. Indeterminate filling pressures  Right Ventricle: The right ventricle has normal systolic function. The cavity was normal. There is no increase in right ventricular wall thickness. Right ventricular systolic pressure could not be assessed.  Left Atrium: Left atrial size was severely dilated.  Right Atrium: Right atrial size was normal in size. Right atrial pressure is estimated at 10 mmHg.  Interatrial Septum: No atrial level shunt detected by color flow Doppler.  Pericardium: There is no evidence of pericardial effusion.  Mitral Valve: The mitral valve  is normal in structure. Mitral valve regurgitation is mild by color flow Doppler.  Tricuspid Valve: The tricuspid valve is normal in structure. Tricuspid valve regurgitation is trivial by color flow Doppler.  Aortic Valve: The aortic valve has an indeterminate number of cusps Moderate thickening of the aortic valve, with moderately decreased cusp excursion. Moderate calcification of the aortic valve. Aortic valve regurgitation is mild by color flow Doppler.  There is Mild stenosis of  the aortic valve.  Pulmonic Valve: The pulmonic valve was normal in structure. Pulmonic valve regurgitation was not assessed by color flow Doppler.  Venous: The inferior vena cava is dilated in size with less than 50% respiratory variability.      Laboratory Data:  Chemistry Recent Labs  Lab 10/22/18 1050 10/22/18 2015 10/23/18 0206 10/24/18 0207  NA 141  --  141 139  K 4.4  --  4.3 3.9  CL 108  --  111 108  CO2 17*  --  18* 22  GLUCOSE 162*  --  81 99  BUN 13  --  26* 23  CREATININE 1.24 1.97* 1.93* 1.31*  CALCIUM 10.3  --  9.3 9.0  GFRNONAA 55* 32* 32* 52*  GFRAA >60 37* 38* >60  ANIONGAP 16*  --  12 9    Recent Labs  Lab 10/22/18 1050 10/24/18 0207  PROT 7.1 5.3*  ALBUMIN 3.7 2.6*  AST 43* 34  ALT 25 31  ALKPHOS 66 43  BILITOT 1.8* 1.0   Hematology Recent Labs  Lab 10/22/18 2304 10/23/18 0206 10/24/18 0207  WBC 18.5* 22.1* 24.1*  RBC 5.34 4.99 4.55  HGB 16.8 15.9 14.4  HCT 49.1 46.0 41.7  MCV 91.9 92.2 91.6  MCH 31.5 31.9 31.6  MCHC 34.2 34.6 34.5  RDW 18.9* 18.6* 18.6*  PLT 199 177 158   Cardiac Enzymes Recent Labs  Lab 10/22/18 1050 10/22/18 2015 10/23/18 0206 10/23/18 0730  TROPONINI 0.07* 0.26* 0.27* 0.20*   No results for input(s): TROPIPOC in the last 168 hours.  BNP Recent Labs  Lab 10/22/18 1051  BNP 60.5    DDimer No results for input(s): DDIMER in the last 168 hours.  Radiology/Studies:   Ct Angio Chest Pe W And/or Wo Contrast Result Date: 10/22/2018 CLINICAL DATA:  Shortness of breath, chest tightness EXAM: CT ANGIOGRAPHY CHEST WITH CONTRAST TECHNIQUE: Multidetector CT imaging of the chest was performed using the standard protocol during bolus administration of intravenous contrast. Multiplanar CT image reconstructions and MIPs were obtained to evaluate the vascular anatomy. CONTRAST:  146mL OMNIPAQUE IOHEXOL 350 MG/ML SOLN COMPARISON:  None. FINDINGS: Cardiovascular: Satisfactory opacification of the pulmonary arteries to  the segmental level. No evidence of pulmonary embolism. Cardiomegaly. Extensive 3 vessel coronary artery calcifications and/or stents. Left chest multi lead pacer. No pericardial effusion. Mediastinum/Nodes: No enlarged mediastinal, hilar, or axillary lymph nodes. Thyroid gland, trachea, and esophagus demonstrate no significant findings. Lungs/Pleura: Bibasilar scarring or atelectasis. No pleural effusion or pneumothorax. Upper Abdomen: No acute abnormality. Postoperative findings of splenectomy. Musculoskeletal: No chest wall abnormality. No acute or significant osseous findings. Review of the MIP images confirms the above findings. IMPRESSION: 1.  Negative examination for pulmonary embolism. 2.  Bibasilar scarring or atelectasis. 3.  Cardiomegaly and coronary artery disease. Electronically Signed   By: Eddie Candle M.D.   On: 10/22/2018 16:14    Dg Chest Portable 1 View Result Date: 10/22/2018 CLINICAL DATA:  Dyspnea and shortness of breath EXAM: PORTABLE CHEST 1 VIEW COMPARISON:  08/13/2018 FINDINGS: Cardiac shadow  is enlarged but accentuated by the portable technique. Pacing device is noted. No focal infiltrate or sizable effusion is seen. No acute bony abnormality is noted. IMPRESSION: No acute abnormality noted. Electronically Signed   By: Inez Catalina M.D.   On: 10/22/2018 11:48    Assessment and Plan:   1. Sepsis     Strep bacteremia, Streptococcus species     ID on case  2. Presumed device/pocket infection     TTE made no mention of vegetations      The pocket looks OK, there is a small area (63mm) of a dark spot middle of his healed incision.        No tethering      No fluctuation      No pain  He has generalized erythema throughout his torso, and arms, he reports ast weekend sufferring a significant sunburn      3. PPM     Implant indication was syncope/sinus node dysfunction     57% AP     6% VP      I anticipate need for PPM system removal Implant date was 06/22/2018 Will  need to discuss wether he will need temp pacing in the interim, he is not dependent, though AP a noted above, the patient very concerned about the idea of not given recurrent episodes of his heart stopping prior to implant initially  Will discuss need for TEE with Dr.Alissa Pharr Await ID recommendations as well  4. SOB     Exam does not suggest fluid OL     Changed from Brilinta to Plavix today by cards       For questions or updates, please contact [place] Please consult [email or web address] for contact info under     Signed, Baldwin Jamaica, PA-C  10/24/2018 10:23 AM   I have seen, examined the patient, and reviewed the above assessment and plan.  Changes to above are made where necessary.  On exam, RRR.  The patient presents with likely pacemaker pocket infection as well as strep bacteremia.  He is not device dependant but is very fearful of having recurrent syncope. I have spoken at length with Drs Curt Bears and Lovena Le.  Dr Lovena Le has advised TEE in the AM followed by pacing system removal by Dr Curt Bears tomorrow.  Given prior syncope, consideration for temp/perm pacing could be considered at the same time.  We will ask ID to assist with antibiotic recommendation as well as guideance for timing of reimplantation. NPO after midnight tonight.  Will need consider for pacing system removal prior to TEE In am.  DC lovenox prior to the procedure Unfortunately, he received 300mg  plavix today.  This will increase his bleeding risks with device system removal.  Co Sign: Thompson Grayer, MD 10/24/2018 10:01 PM

## 2018-10-24 NOTE — Progress Notes (Addendum)
Patient's family concerned about Patient.   Spoke to charge nurse- email was forward to [place].   Through out the day, patient has not said anything about any concerns regarding family and wife has not called unit.   Spouse is the only emergency contact in our records.    Paged MD- asked to call wife regarding plan of care.    Spoke to Dr. Florene Glen-  He has talked to wife.  Per MD she had a few questions, but otherwise seemed fine.  Per Wife- she has spoken to another MD earlier.

## 2018-10-24 NOTE — Progress Notes (Signed)
Progress Note  Patient Name: [name] Glass Date of Encounter: 10/24/2018  Primary Cardiologist: No primary care provider on file. [place]  Subjective   Still dyspneic.   Inpatient Medications    Scheduled Meds:  anastrozole  1 mg Oral Once per day on Mon Thu   aspirin EC  81 mg Oral Daily   enoxaparin (LOVENOX) injection  40 mg Subcutaneous Q24H   ezetimibe  10 mg Oral Daily   fluticasone  2 spray Each Nare Daily   ipratropium-albuterol  3 mL Nebulization TID   pantoprazole  40 mg Oral Daily   pravastatin  40 mg Oral q1800   tamsulosin  0.4 mg Oral QPC breakfast   thyroid  120 mg Oral QAC breakfast   thyroid  60 mg Oral QPM   ticagrelor  90 mg Oral BID   Continuous Infusions:  cefTRIAXone (ROCEPHIN)  IV 2 g (10/23/18 1200)   lactated ringers 125 mL/hr at 10/24/18 0213   PRN Meds: acetaminophen, albuterol, HYDROmorphone (DILAUDID) injection, ondansetron **OR** ondansetron (ZOFRAN) IV, oxyCODONE   Vital Signs    Vitals:   10/23/18 2050 10/24/18 0458 10/24/18 0511 10/24/18 0729  BP:   124/68   Pulse:   71   Resp:   18   Temp:   98.6 F (37 C)   TempSrc:   Oral   SpO2: 95%  95% 95%  Weight:  99 kg    Height:        Intake/Output Summary (Last 24 hours) at 10/24/2018 0823 Last data filed at 10/24/2018 0000 Gross per 24 hour  Intake 600 ml  Output 1350 ml  Net -750 ml   Last 3 Weights 10/24/2018 10/23/2018 10/22/2018  Weight (lbs) 218 lb 3.2 oz 215 lb 212 lb 1.6 oz  Weight (kg) 98.975 kg 97.523 kg 96.208 kg      Telemetry    Paced- Personally Reviewed  ECG    No AM EKG- Personally Reviewed  Physical Exam   GEN: No acute distress.   Neck: No JVD Cardiac: RRR, no murmurs, rubs, or gallops.  Respiratory: Clear to auscultation bilaterally. GI: Soft, nontender, non-distended  MS: No edema; No deformity. Neuro:  Nonfocal  Psych: Normal affect  Skin: pacer pocket slightly warm with small scab.   Labs    Chemistry Recent Labs  Lab  10/22/18 1050 10/22/18 2015 10/23/18 0206 10/24/18 0207  NA 141  --  141 139  K 4.4  --  4.3 3.9  CL 108  --  111 108  CO2 17*  --  18* 22  GLUCOSE 162*  --  81 99  BUN 13  --  26* 23  CREATININE 1.24 1.97* 1.93* 1.31*  CALCIUM 10.3  --  9.3 9.0  PROT 7.1  --   --  5.3*  ALBUMIN 3.7  --   --  2.6*  AST 43*  --   --  34  ALT 25  --   --  31  ALKPHOS 66  --   --  43  BILITOT 1.8*  --   --  1.0  GFRNONAA 55* 32* 32* 52*  GFRAA >60 37* 38* >60  ANIONGAP 16*  --  12 9     Hematology Recent Labs  Lab 10/22/18 2304 10/23/18 0206 10/24/18 0207  WBC 18.5* 22.1* 24.1*  RBC 5.34 4.99 4.55  HGB 16.8 15.9 14.4  HCT 49.1 46.0 41.7  MCV 91.9 92.2 91.6  MCH 31.5 31.9 31.6  MCHC 34.2 34.6  34.5  RDW 18.9* 18.6* 18.6*  PLT 199 177 158    Cardiac Enzymes Recent Labs  Lab 10/22/18 1050 10/22/18 2015 10/23/18 0206 10/23/18 0730  TROPONINI 0.07* 0.26* 0.27* 0.20*   No results for input(s): TROPIPOC in the last 168 hours.   BNP Recent Labs  Lab 10/22/18 1051  BNP 60.5     DDimer No results for input(s): DDIMER in the last 168 hours.   Radiology    Ct Angio Chest Pe W And/or Wo Contrast  Result Date: 10/22/2018 CLINICAL DATA:  Shortness of breath, chest tightness EXAM: CT ANGIOGRAPHY CHEST WITH CONTRAST TECHNIQUE: Multidetector CT imaging of the chest was performed using the standard protocol during bolus administration of intravenous contrast. Multiplanar CT image reconstructions and MIPs were obtained to evaluate the vascular anatomy. CONTRAST:  160mL OMNIPAQUE IOHEXOL 350 MG/ML SOLN COMPARISON:  None. FINDINGS: Cardiovascular: Satisfactory opacification of the pulmonary arteries to the segmental level. No evidence of pulmonary embolism. Cardiomegaly. Extensive 3 vessel coronary artery calcifications and/or stents. Left chest multi lead pacer. No pericardial effusion. Mediastinum/Nodes: No enlarged mediastinal, hilar, or axillary lymph nodes. Thyroid gland, trachea, and  esophagus demonstrate no significant findings. Lungs/Pleura: Bibasilar scarring or atelectasis. No pleural effusion or pneumothorax. Upper Abdomen: No acute abnormality. Postoperative findings of splenectomy. Musculoskeletal: No chest wall abnormality. No acute or significant osseous findings. Review of the MIP images confirms the above findings. IMPRESSION: 1.  Negative examination for pulmonary embolism. 2.  Bibasilar scarring or atelectasis. 3.  Cardiomegaly and coronary artery disease. Electronically Signed   By: Eddie Candle M.D.   On: 10/22/2018 16:14   Mr Thoracic Spine Wo Contrast  Result Date: 10/23/2018 CLINICAL DATA:  Initial evaluation for acute sepsis. EXAM: MRI THORACIC AND LUMBAR SPINE WITHOUT CONTRAST TECHNIQUE: Multiplanar and multiecho pulse sequences of the thoracic and lumbar spine were obtained without intravenous contrast. COMPARISON:  None available. FINDINGS: MRI THORACIC SPINE FINDINGS Alignment: Market dextroscoliosis of the midthoracic spine. Grade 1 anterolisthesis of T2 on T3, likely chronic and degenerative. Alignment otherwise normal with preservation of the normal thoracic kyphosis. Vertebrae: Vertebral body heights maintained without evidence for acute or chronic fracture. Few scattered chronic endplate Schmorl's nodes noted within the lower thoracic spine. Bone marrow signal intensity within normal limits. No discrete or worrisome osseous lesions. No evidence for osteomyelitis discitis or septic arthritis. Cord: Signal intensity within the thoracic spinal cord is normal. Normal cord caliber and morphology. No epidural collections. Paraspinal and other soft tissues: Paraspinous soft tissues demonstrate no acute finding. Small layering right pleural effusion with associated atelectasis. Visualized visceral structures grossly within normal limits. Disc levels: T1-2: Bilateral facet hypertrophy, greater on the right. No significant stenosis. T2-3: Grade 1 anterolisthesis. Bilateral  facet hypertrophy. No spinal stenosis. Mild right foraminal narrowing. T3-4:  Negative interspace.  Mild facet hypertrophy.  No stenosis. T4-5: Negative interspace. Right greater than left facet hypertrophy. No significant stenosis. T5-6:  Negative interspace.  Mild facet hypertrophy.  No stenosis. T6-7: Negative interspace. Mild facet and ligament flavum hypertrophy. No significant stenosis. T7-8:  Negative interspace.  Mild facet hypertrophy.  No stenosis. T8-9:  Mild facet hypertrophy.  No stenosis. T9-10: Bilateral facet hypertrophy with ankylosis. No significant stenosis. T10-11:  Posterior element hypertrophy.  No stenosis. T11-12:  Bilateral facet hypertrophy.  No stenosis. T12-L1:  Negative. 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 L5-S1 disc somewhat rudimentary. Alignment: Trace 2 mm anterolisthesis of L3 on L4. Mild levoscoliosis. Alignment otherwise  normal with preservation of the normal lumbar lordosis. Vertebrae: Vertebral body heights maintained without evidence for acute or chronic fracture. Bone marrow signal intensity mildly heterogeneous but within normal limits. No worrisome osseous lesions. Minimal reactive marrow edema about the bilateral facets at L2-3 through L4-5, likely due to facet arthritis, greater on the right. No evidence for osteomyelitis or discitis. Partially visualized SI joints within normal limits. Conus medullaris and cauda equina: Conus extends to the L1 level. Conus and cauda equina appear normal. Paraspinal and other soft tissues: Mild edema seen involving the right greater than left lower posterior paraspinous musculature (series 27, image 5), likely reactive in nature due to adjacent facet degeneration. Possible muscular strain and/or injury or acute myositis could also be considered. No loculated collections. Paraspinous soft tissues demonstrate no other acute finding. Approximate 1 cm  right renal cyst noted. Atherosclerotic change noted within the intra-abdominal aorta. Visualized visceral structures otherwise unremarkable. Disc levels: L1-2:  Unremarkable. L2-3: Mild disc bulge with disc desiccation. Moderate right with mild left facet hypertrophy. Superimposed 14 mm synovial cyst noted at the lateral aspect of the right L2-3 facet (series 29, image 10). This exhibits no mass effect on the adjacent thecal sac. Thecal sac. Resultant mild canal with right lateral recess narrowing. Mild right L2 foraminal stenosis. L3-4: Trace anterolisthesis. Mild disc bulge with disc desiccation. Moderate bilateral facet hypertrophy. Resultant mild canal with bilateral lateral recess stenosis, slightly worse on the right. Mild bilateral foraminal narrowing. L4-5: Disc desiccation with minimal annular bulge. Moderate facet hypertrophy. No significant stenosis. L5-S1: Transitional lumbosacral anatomy with rudimentary L5-S1 disc. Minimal endplate osteophytic spurring without significant disc bulge. Mild facet hypertrophy. No significant stenosis. IMPRESSION: MR THORACIC SPINE IMPRESSION 1. No evidence for acute infection or other abnormality within the thoracic spine. 2. Moderate thoracic dextroscoliosis with associated multilevel facet hypertrophy as above. No significant spinal stenosis. Mild right foraminal narrowing at T2-3. 3. Small layering right pleural effusion with associated atelectasis. MR LUMBAR SPINE IMPRESSION 1. Mild paraspinous edema involving the right greater than left lower posterior paraspinous musculature, favored to be reactive in nature due to adjacent facet degeneration. Finding could result in right-sided lower back pain. Sequelae of muscular injury and/or strain or possibly myositis could also be considered. No discrete soft tissue collections. 2. No other evidence for acute infection within the lumbar spine. No evidence for osteomyelitis or discitis. No epidural collections. 3. Mild for  age multilevel disc bulging with moderate facet hypertrophy at L2-3 through L4-5 as above. Resultant mild canal with bilateral lateral recess narrowing at L2-3 and L3-4. Electronically Signed   By: Jeannine Boga M.D.   On: 10/23/2018 19:23   Mr Lumbar Spine Wo Contrast  Result Date: 10/23/2018 CLINICAL DATA:  Initial evaluation for acute sepsis. EXAM: MRI THORACIC AND LUMBAR SPINE WITHOUT CONTRAST TECHNIQUE: Multiplanar and multiecho pulse sequences of the thoracic and lumbar spine were obtained without intravenous contrast. COMPARISON:  None available. FINDINGS: MRI THORACIC SPINE FINDINGS Alignment: Market dextroscoliosis of the midthoracic spine. Grade 1 anterolisthesis of T2 on T3, likely chronic and degenerative. Alignment otherwise normal with preservation of the normal thoracic kyphosis. Vertebrae: Vertebral body heights maintained without evidence for acute or chronic fracture. Few scattered chronic endplate Schmorl's nodes noted within the lower thoracic spine. Bone marrow signal intensity within normal limits. No discrete or worrisome osseous lesions. No evidence for osteomyelitis discitis or septic arthritis. Cord: Signal intensity within the thoracic spinal cord is normal. Normal cord caliber and morphology. No epidural collections. Paraspinal and  other soft tissues: Paraspinous soft tissues demonstrate no acute finding. Small layering right pleural effusion with associated atelectasis. Visualized visceral structures grossly within normal limits. Disc levels: T1-2: Bilateral facet hypertrophy, greater on the right. No significant stenosis. T2-3: Grade 1 anterolisthesis. Bilateral facet hypertrophy. No spinal stenosis. Mild right foraminal narrowing. T3-4:  Negative interspace.  Mild facet hypertrophy.  No stenosis. T4-5: Negative interspace. Right greater than left facet hypertrophy. No significant stenosis. T5-6:  Negative interspace.  Mild facet hypertrophy.  No stenosis. T6-7: Negative  interspace. Mild facet and ligament flavum hypertrophy. No significant stenosis. T7-8:  Negative interspace.  Mild facet hypertrophy.  No stenosis. T8-9:  Mild facet hypertrophy.  No stenosis. T9-10: Bilateral facet hypertrophy with ankylosis. No significant stenosis. T10-11:  Posterior element hypertrophy.  No stenosis. T11-12:  Bilateral facet hypertrophy.  No stenosis. T12-L1:  Negative. 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 L5-S1 disc somewhat rudimentary. Alignment: Trace 2 mm anterolisthesis of L3 on L4. Mild levoscoliosis. Alignment otherwise normal with preservation of the normal lumbar lordosis. Vertebrae: Vertebral body heights maintained without evidence for acute or chronic fracture. Bone marrow signal intensity mildly heterogeneous but within normal limits. No worrisome osseous lesions. Minimal reactive marrow edema about the bilateral facets at L2-3 through L4-5, likely due to facet arthritis, greater on the right. No evidence for osteomyelitis or discitis. Partially visualized SI joints within normal limits. Conus medullaris and cauda equina: Conus extends to the L1 level. Conus and cauda equina appear normal. Paraspinal and other soft tissues: Mild edema seen involving the right greater than left lower posterior paraspinous musculature (series 27, image 5), likely reactive in nature due to adjacent facet degeneration. Possible muscular strain and/or injury or acute myositis could also be considered. No loculated collections. Paraspinous soft tissues demonstrate no other acute finding. Approximate 1 cm right renal cyst noted. Atherosclerotic change noted within the intra-abdominal aorta. Visualized visceral structures otherwise unremarkable. Disc levels: L1-2:  Unremarkable. L2-3: Mild disc bulge with disc desiccation. Moderate right with mild left facet hypertrophy. Superimposed 14 mm synovial cyst noted at the  lateral aspect of the right L2-3 facet (series 29, image 10). This exhibits no mass effect on the adjacent thecal sac. Thecal sac. Resultant mild canal with right lateral recess narrowing. Mild right L2 foraminal stenosis. L3-4: Trace anterolisthesis. Mild disc bulge with disc desiccation. Moderate bilateral facet hypertrophy. Resultant mild canal with bilateral lateral recess stenosis, slightly worse on the right. Mild bilateral foraminal narrowing. L4-5: Disc desiccation with minimal annular bulge. Moderate facet hypertrophy. No significant stenosis. L5-S1: Transitional lumbosacral anatomy with rudimentary L5-S1 disc. Minimal endplate osteophytic spurring without significant disc bulge. Mild facet hypertrophy. No significant stenosis. IMPRESSION: MR THORACIC SPINE IMPRESSION 1. No evidence for acute infection or other abnormality within the thoracic spine. 2. Moderate thoracic dextroscoliosis with associated multilevel facet hypertrophy as above. No significant spinal stenosis. Mild right foraminal narrowing at T2-3. 3. Small layering right pleural effusion with associated atelectasis. MR LUMBAR SPINE IMPRESSION 1. Mild paraspinous edema involving the right greater than left lower posterior paraspinous musculature, favored to be reactive in nature due to adjacent facet degeneration. Finding could result in right-sided lower back pain. Sequelae of muscular injury and/or strain or possibly myositis could also be considered. No discrete soft tissue collections. 2. No other evidence for acute infection within the lumbar spine. No evidence for osteomyelitis or discitis. No epidural collections. 3. Mild for age multilevel disc bulging with moderate facet hypertrophy  at L2-3 through L4-5 as above. Resultant mild canal with bilateral lateral recess narrowing at L2-3 and L3-4. Electronically Signed   By: Jeannine Boga M.D.   On: 10/23/2018 19:23   Dg Chest Portable 1 View  Result Date: 10/22/2018 CLINICAL DATA:   Dyspnea and shortness of breath EXAM: PORTABLE CHEST 1 VIEW COMPARISON:  08/13/2018 FINDINGS: Cardiac shadow is enlarged but accentuated by the portable technique. Pacing device is noted. No focal infiltrate or sizable effusion is seen. No acute bony abnormality is noted. IMPRESSION: No acute abnormality noted. Electronically Signed   By: Inez Catalina M.D.   On: 10/22/2018 11:48    Cardiac Studies   Echo 10/23/18: 1. The left ventricle has low normal systolic function, with an ejection fraction of 50-55%. The cavity size was normal. There is moderate concentric left ventricular hypertrophy. Left ventricular diastolic Doppler parameters are consistent with  pseudonormalization. Indeterminate filling pressures.  2. The right ventricle has normal systolic function. The cavity was normal. There is no increase in right ventricular wall thickness. Right ventricular systolic pressure could not be assessed.  3. Left atrial size was severely dilated.  4. The aortic valve has an indeterminate number of cusps. Moderate thickening of the aortic valve. Moderate calcification of the aortic valve. Aortic valve regurgitation is mild by color flow Doppler. Mild stenosis of the aortic valve.  5. The inferior vena cava was dilated in size with <50% respiratory variability.  Patient Profile     78 y.o. male with history of CAD, last stent January 2020, pacemaker, HTN and HLD admitted with fatigue, rigors, SOB,confusion,cough, fever and started on antibiotics for strept bacteremia. Covid negative x 2. Concern by ID team for pacemaker pocket infection.  Pacemaker implanted in [place] but followed by Dr. Caryl Comes here locally.   Assessment & Plan    1. Sepsis: Concern by ID team for possible pacemaker pocket infection vs acute lumbar vertebral infection. Pacemaker followed by Dr. Caryl Comes. Echo 10/23/18 with LVEF=50-55%. Mild AI and AS. Given bacteremia he may need to have his device removed. I will ask the EP team to see him  today. Will let EP team guide decision/planning for TEE.   2. Dyspnea: He has been dyspneic on Brilinta. No obvious volume overload on exam. Will change Brilinta to Plavix 75 mg daily  3. CAD without angina: Continue ASA and statin. Start Plavix. Stop Brilinta.       For questions or updates, please contact [place] Please consult [email or web address] for contact info under        Signed, Lauree Chandler, MD  10/24/2018, 8:23 AM

## 2018-10-25 ENCOUNTER — Encounter (HOSPITAL_COMMUNITY): Payer: Self-pay

## 2018-10-25 ENCOUNTER — Inpatient Hospital Stay (HOSPITAL_COMMUNITY)
Admission: EM | Disposition: A | Discharge status: Home Health | Payer: Self-pay | Source: Home / Self Care | Attending: Family Medicine

## 2018-10-25 ENCOUNTER — Encounter (HOSPITAL_COMMUNITY)
Admission: EM | Disposition: A | Discharge status: Home Health | Payer: Self-pay | Source: Home / Self Care | Attending: Family Medicine

## 2018-10-25 ENCOUNTER — Inpatient Hospital Stay (HOSPITAL_COMMUNITY): Payer: Medicare Other | Admitting: Anesthesiology

## 2018-10-25 ENCOUNTER — Inpatient Hospital Stay (HOSPITAL_COMMUNITY): Payer: Medicare Other

## 2018-10-25 DIAGNOSIS — I340 Nonrheumatic mitral (valve) insufficiency: Secondary | ICD-10-CM

## 2018-10-25 DIAGNOSIS — B954 Other streptococcus as the cause of diseases classified elsewhere: Secondary | ICD-10-CM

## 2018-10-25 DIAGNOSIS — R7881 Bacteremia: Secondary | ICD-10-CM

## 2018-10-25 HISTORY — PX: TEMPORARY PACEMAKER: CATH118268

## 2018-10-25 HISTORY — PX: PPM GENERATOR REMOVAL: EP1234

## 2018-10-25 HISTORY — PX: TEE WITHOUT CARDIOVERSION: SHX5443

## 2018-10-25 LAB — COMPREHENSIVE METABOLIC PANEL
ALT: 29 U/L (ref 0–44)
AST: 25 U/L (ref 15–41)
Albumin: 2.8 g/dL — ABNORMAL LOW (ref 3.5–5.0)
Alkaline Phosphatase: 53 U/L (ref 38–126)
Anion gap: 7 (ref 5–15)
BUN: 18 mg/dL (ref 8–23)
CO2: 25 mmol/L (ref 22–32)
Calcium: 9.6 mg/dL (ref 8.9–10.3)
Chloride: 110 mmol/L (ref 98–111)
Creatinine, Ser: 1.17 mg/dL (ref 0.61–1.24)
GFR calc Af Amer: >60 mL/min (ref >60)
GFR calc non Af Amer: 59 mL/min — ABNORMAL LOW (ref >60)
Glucose, Bld: 108 mg/dL — ABNORMAL HIGH (ref 70–99)
Potassium: 4.1 mmol/L (ref 3.5–5.1)
Sodium: 142 mmol/L (ref 135–145)
Total Bilirubin: 1 mg/dL (ref 0.3–1.2)
Total Protein: 5.7 g/dL — ABNORMAL LOW (ref 6.5–8.1)

## 2018-10-25 LAB — CBC
HCT: 44.1 % (ref 39.0–52.0)
Hemoglobin: 15.2 g/dL (ref 13.0–17.0)
MCH: 31.1 pg (ref 26.0–34.0)
MCHC: 34.5 g/dL (ref 30.0–36.0)
MCV: 90.4 fL (ref 80.0–100.0)
Platelets: 161 10*3/uL (ref 150–400)
RBC: 4.88 MIL/uL (ref 4.22–5.81)
RDW: 18.4 % — ABNORMAL HIGH (ref 11.5–15.5)
WBC: 19.8 10*3/uL — ABNORMAL HIGH (ref 4.0–10.5)
nRBC: 0.3 % — ABNORMAL HIGH (ref 0.0–0.2)

## 2018-10-25 LAB — C-REACTIVE PROTEIN: CRP: 13.8 mg/dL — ABNORMAL HIGH (ref <1.0)

## 2018-10-25 LAB — MAGNESIUM: Magnesium: 1.9 mg/dL (ref 1.7–2.4)

## 2018-10-25 SURGERY — PPM GENERATOR REMOVAL

## 2018-10-25 SURGERY — ECHOCARDIOGRAM, TRANSESOPHAGEAL
Anesthesia: Monitor Anesthesia Care

## 2018-10-25 MED ORDER — FENTANYL CITRATE (PF) 100 MCG/2ML IJ SOLN
INTRAMUSCULAR | Status: AC
  Filled 2018-10-25: qty 2

## 2018-10-25 MED ORDER — SODIUM CHLORIDE 0.9% FLUSH
3.0000 mL | Freq: Two times a day (BID) | INTRAVENOUS | Status: DC
  Administered 2018-10-25 – 2018-10-31 (×11): 3 mL via INTRAVENOUS

## 2018-10-25 MED ORDER — LIDOCAINE HCL 1 % IJ SOLN
INTRAMUSCULAR | Status: AC
  Filled 2018-10-25: qty 20

## 2018-10-25 MED ORDER — SODIUM CHLORIDE 0.9 % IV SOLN
2.0000 g | INTRAVENOUS | Status: DC
  Administered 2018-10-25 – 2018-10-30 (×6): 2 g via INTRAVENOUS
  Filled 2018-10-25 (×7): qty 20

## 2018-10-25 MED ORDER — SODIUM CHLORIDE 0.9% FLUSH: 3.0000 mL | INTRAVENOUS | Status: DC | PRN

## 2018-10-25 MED ORDER — HEPARIN (PORCINE) IN NACL 1000-0.9 UT/500ML-% IV SOLN
INTRAVENOUS | Status: DC | PRN
  Administered 2018-10-25: 500 mL

## 2018-10-25 MED ORDER — SODIUM CHLORIDE 0.9 % IV SOLN: INTRAVENOUS | Status: DC

## 2018-10-25 MED ORDER — LIDOCAINE HCL (PF) 1 % IJ SOLN
INTRAMUSCULAR | Status: DC | PRN
  Administered 2018-10-25: 80 mL

## 2018-10-25 MED ORDER — LACTATED RINGERS IV SOLN
INTRAVENOUS | Status: DC | PRN
  Administered 2018-10-25: 08:00:00 via INTRAVENOUS

## 2018-10-25 MED ORDER — HEPARIN (PORCINE) IN NACL 1000-0.9 UT/500ML-% IV SOLN
INTRAVENOUS | Status: AC
  Filled 2018-10-25: qty 500

## 2018-10-25 MED ORDER — SODIUM CHLORIDE 0.9 % IV SOLN: 250.0000 mL | INTRAVENOUS | Status: DC

## 2018-10-25 MED ORDER — CEFAZOLIN SODIUM-DEXTROSE 2-4 GM/100ML-% IV SOLN
INTRAVENOUS | Status: AC
  Filled 2018-10-25: qty 100

## 2018-10-25 MED ORDER — BUTAMBEN-TETRACAINE-BENZOCAINE 2-2-14 % EX AERO
INHALATION_SPRAY | CUTANEOUS | Status: DC | PRN
  Administered 2018-10-25: 09:00:00 2 via TOPICAL

## 2018-10-25 MED ORDER — CHLORHEXIDINE GLUCONATE 4 % EX LIQD: 60.0000 mL | Freq: Once | CUTANEOUS | Status: DC

## 2018-10-25 MED ORDER — CEFAZOLIN SODIUM-DEXTROSE 1-4 GM/50ML-% IV SOLN: 1.0000 g | Freq: Four times a day (QID) | INTRAVENOUS | Status: DC

## 2018-10-25 MED ORDER — PROPOFOL 500 MG/50ML IV EMUL
INTRAVENOUS | Status: DC | PRN
  Administered 2018-10-25: 75 ug/kg/min via INTRAVENOUS

## 2018-10-25 MED ORDER — DEXMEDETOMIDINE HCL 200 MCG/2ML IV SOLN
INTRAVENOUS | Status: DC | PRN
  Administered 2018-10-25: 4 ug via INTRAVENOUS
  Administered 2018-10-25: 8 ug via INTRAVENOUS

## 2018-10-25 MED ORDER — CEFAZOLIN SODIUM-DEXTROSE 2-4 GM/100ML-% IV SOLN
2.0000 g | INTRAVENOUS | Status: DC
  Administered 2018-10-25: 2 g via INTRAVENOUS

## 2018-10-25 MED ORDER — MIDAZOLAM HCL 2 MG/2ML IJ SOLN: INTRAMUSCULAR | Status: DC | PRN

## 2018-10-25 MED ORDER — SODIUM CHLORIDE 0.9 % IV SOLN
80.0000 mg | INTRAVENOUS | Status: DC
  Administered 2018-10-25: 80 mg

## 2018-10-25 MED ORDER — FENTANYL CITRATE (PF) 100 MCG/2ML IJ SOLN
INTRAMUSCULAR | Status: DC | PRN
  Administered 2018-10-25 (×3): 25 ug via INTRAVENOUS

## 2018-10-25 MED ORDER — MIDAZOLAM HCL 5 MG/5ML IJ SOLN
INTRAMUSCULAR | Status: AC
  Filled 2018-10-25: qty 5

## 2018-10-25 MED ORDER — MIDAZOLAM HCL 5 MG/5ML IJ SOLN
INTRAMUSCULAR | Status: DC | PRN
  Administered 2018-10-25 (×3): 1 mg via INTRAVENOUS

## 2018-10-25 MED ORDER — SODIUM CHLORIDE 0.9 % IV SOLN
INTRAVENOUS | Status: AC
  Filled 2018-10-25: qty 2

## 2018-10-25 MED ORDER — PROPOFOL 10 MG/ML IV BOLUS
INTRAVENOUS | Status: DC | PRN
  Administered 2018-10-25 (×2): 20 mg via INTRAVENOUS
  Administered 2018-10-25: 15 mg via INTRAVENOUS

## 2018-10-25 MED ORDER — LIDOCAINE HCL (CARDIAC) PF 100 MG/5ML IV SOSY
PREFILLED_SYRINGE | INTRAVENOUS | Status: DC | PRN
  Administered 2018-10-25: 40 mg via INTRAVENOUS

## 2018-10-25 SURGICAL SUPPLY — 7 items
CABLE SURGICAL S-101-97-12 (CABLE) ×3 IMPLANT
KIT MICROPUNCTURE NIT STIFF (SHEATH) ×2 IMPLANT
LEAD CAPSURE NOVUS 5076-58CM (Lead) ×2 IMPLANT
PAD PRO RADIOLUCENT 2001M-C (PAD) ×3 IMPLANT
SHEATH CLASSIC 7F (SHEATH) ×2 IMPLANT
SHEATH PROBE COVER 6X72 (BAG) ×2 IMPLANT
TRAY PACEMAKER INSERTION (PACKS) ×3 IMPLANT

## 2018-10-25 NOTE — Progress Notes (Signed)
[facility] for Infectious Disease  Date of Admission:  10/22/2018      Total days of antibiotics 4   Day 2 PCN G           ASSESSMENT: Still with shortness of breath but much less than what he described coming in to the hospital. Hands are less swollen today. TEE has been performed and revealed no vegetations on ICD lead or any of his heart valves. Incidental finding of PFO. Planning device extraction later this afternoon. I will draw blood cultures later this afternoon after device to be certain, however his blood cultures from 5/20 are already negative at 48 hours which is reassuring. Based on guidelines for re-implantation in a patient with positive blood cultures and negative TEE for valvular vegetations it is recommended to re-implant his device after 72 hours barring his blood cultures remain no growth over weekend - would plan Monday afternoon to re-implant permanent system.   OK to place PICC line whenever EP deems acceptable given he will have temporary pacer wires and needing new system placed.    PLAN: 1. Continue Penicillin G infusion while inpatient  2. Blood cultures to be repeated this evening 3. Recommend re-implantation of PPM Monday 5/25 afternoon barring blood cultures remain negative   Principal Problem:   Streptococcal Gallolyticus Bacteremia Active Problems:   Presence of permanent cardiac pacemaker   Acute low back pain   Coronary artery disease   OA (osteoarthritis) of knee   Gastroesophageal reflux disease without esophagitis   Essential hypertension, benign   Hyperlipidemia with target LDL less than 70   Chronic renal disease, stage 2, mildly decreased glomerular filtration rate (GFR) between 60-89 mL/min/1.73 square meter   Sepsis ([place])   H/O splenectomy   Sleep apnea   Chronic bronchitis ([place])   . anastrozole  1 mg Oral Once per day on Mon Thu  . aspirin EC  81 mg Oral Daily  . ezetimibe  10 mg Oral Daily  . fluticasone  2 spray  Each Nare Daily  . ipratropium-albuterol  3 mL Nebulization BID  . pantoprazole  40 mg Oral Daily  . pravastatin  40 mg Oral q1800  . sodium chloride flush  3 mL Intravenous Q12H  . tamsulosin  0.4 mg Oral QPC breakfast  . thyroid  120 mg Oral QAC breakfast  . thyroid  60 mg Oral QPM    SUBJECTIVE: Doing well today. No sore throat from TEE but still a little numb. Only complaint today he has is the ongoing shortness of breath. This is most prominent he says when he bends over. He has had this every since starting brilinta and thankful he will be coming off of that. He is "ready to get to feeling better."  Review of Systems: Review of Systems  Constitutional: Negative for chills and fever.  HENT: Negative for tinnitus.   Eyes: Negative for blurred vision and photophobia.  Respiratory: Positive for shortness of breath. Negative for cough and sputum production.   Cardiovascular: Negative for chest pain and leg swelling.  Gastrointestinal: Negative for diarrhea, nausea and vomiting.  Genitourinary: Negative for dysuria.  Musculoskeletal: Negative for back pain.       Hand/finger swelling and pain  Skin: Negative for rash.  Neurological: Negative for weakness and headaches.    Allergies  Allergen Reactions  . Morphine And Related     Had "crazy dreams" felt "crazy" per pt.  . Bactrim [Sulfamethoxazole-Trimethoprim]   .  Statins     OBJECTIVE: Vitals:   10/25/18 0927 10/25/18 0933 10/25/18 0934 10/25/18 0938  BP: 115/68 108/77  122/68  Pulse: 64 (!) 25  61  Resp: 18 (!) 21 (!) 23 (!) 23  Temp: 97.9 F (36.6 C)     TempSrc: Oral     SpO2: 93% 96% 97% 95%  Weight:      Height:       Body mass index is 28.79 kg/m.  Physical Exam Constitutional:      Comments: Seated comfortably in recliner. Smiling and very pleasant.   Cardiovascular:     Rate and Rhythm: Normal rate and regular rhythm.     Heart sounds: No murmur.  Pulmonary:     Effort: Pulmonary effort is normal.      Breath sounds: No decreased breath sounds or rhonchi.  Chest:     Chest wall: No tenderness.  Abdominal:     General: Bowel sounds are normal.     Palpations: Abdomen is soft.  Musculoskeletal:     Right lower leg: No edema.     Left lower leg: No edema.  Skin:    General: Skin is warm and dry.     Comments: Improved swelling to digits on b/l hands today.   Neurological:     Mental Status: He is alert.     Lab Results Lab Results  Component Value Date   WBC 19.8 (H) 10/25/2018   HGB 15.2 10/25/2018   HCT 44.1 10/25/2018   MCV 90.4 10/25/2018   PLT 161 10/25/2018    Lab Results  Component Value Date   CREATININE 1.17 10/25/2018   BUN 18 10/25/2018   NA 142 10/25/2018   K 4.1 10/25/2018   CL 110 10/25/2018   CO2 25 10/25/2018    Lab Results  Component Value Date   ALT 29 10/25/2018   AST 25 10/25/2018   ALKPHOS 53 10/25/2018   BILITOT 1.0 10/25/2018     Microbiology: Recent Results (from the past 240 hour(s))  SARS Coronavirus 2 (CEPHEID- Performed in [facility] hospital lab), Hosp Order     Status: None   Collection Time: 10/22/18 10:47 AM  Result Value Ref Range Status   SARS Coronavirus 2 NEGATIVE NEGATIVE Final    Comment: (NOTE) If result is NEGATIVE SARS-CoV-2 target nucleic acids are NOT DETECTED. The SARS-CoV-2 RNA is generally detectable in upper and lower  respiratory specimens during the acute phase of infection. The lowest  concentration of SARS-CoV-2 viral copies this assay can detect is 250  copies / mL. A negative result does not preclude SARS-CoV-2 infection  and should not be used as the sole basis for treatment or other  patient management decisions.  A negative result may occur with  improper specimen collection / handling, submission of specimen other  than nasopharyngeal swab, presence of viral mutation(s) within the  areas targeted by this assay, and inadequate number of viral copies  (<250 copies / mL). A negative result must be  combined with clinical  observations, patient history, and epidemiological information. If result is POSITIVE SARS-CoV-2 target nucleic acids are DETECTED. The SARS-CoV-2 RNA is generally detectable in upper and lower  respiratory specimens dur ing the acute phase of infection.  Positive  results are indicative of active infection with SARS-CoV-2.  Clinical  correlation with patient history and other diagnostic information is  necessary to determine patient infection status.  Positive results do  not rule out bacterial infection or co-infection with  other viruses. If result is PRESUMPTIVE POSTIVE SARS-CoV-2 nucleic acids MAY BE PRESENT.   A presumptive positive result was obtained on the submitted specimen  and confirmed on repeat testing.  While 2019 novel coronavirus  (SARS-CoV-2) nucleic acids may be present in the submitted sample  additional confirmatory testing may be necessary for epidemiological  and / or clinical management purposes  to differentiate between  SARS-CoV-2 and other Sarbecovirus currently known to infect humans.  If clinically indicated additional testing with an alternate test  methodology [phone]) is advised. The SARS-CoV-2 RNA is generally  detectable in upper and lower respiratory sp ecimens during the acute  phase of infection. The expected result is Negative. Fact Sheet for Patients:  StrictlyIdeas.no Fact Sheet for Healthcare Providers: BankingDealers.co.za This test is not yet approved or cleared by the [place] FDA and has been authorized for detection and/or diagnosis of SARS-CoV-2 by FDA under an Emergency Use Authorization (EUA).  This EUA will remain in effect (meaning this test can be used) for the duration of the COVID-19 declaration under Section 564(b)(1) of the Act, 21 U.S.C. section 360bbb-3(b)(1), unless the authorization is terminated or revoked sooner. Performed at [facility], [place] [address], [place], [place] [address]   Blood culture (routine x 2)     Status: Abnormal   Collection Time: 10/22/18 10:52 AM  Result Value Ref Range Status   Specimen Description BLOOD BLOOD LEFT FOREARM  Final   Special Requests   Final    BOTTLES DRAWN AEROBIC AND ANAEROBIC Blood Culture adequate volume   Culture  Setup Time   Final    IN BOTH AEROBIC AND ANAEROBIC BOTTLES GRAM POSITIVE COCCI CRITICAL RESULT CALLED TO, READ BACK BY AND VERIFIED WITH: J ORIET PHARMD 10/22/18 2300 JDW    Culture (A)  Final    STREPTOCOCCUS GALLOLYTICUS SUSCEPTIBILITIES PERFORMED ON PREVIOUS CULTURE WITHIN THE LAST 5 DAYS. Performed at [facility], [place] [address], [place], [place] [address]    Report Status 10/24/2018 FINAL  Final  Blood Culture ID Panel (Reflexed)     Status: Abnormal   Collection Time: 10/22/18 10:52 AM  Result Value Ref Range Status   Enterococcus species NOT DETECTED NOT DETECTED Final   Listeria monocytogenes NOT DETECTED NOT DETECTED Final   Staphylococcus species NOT DETECTED NOT DETECTED Final   Staphylococcus aureus (BCID) NOT DETECTED NOT DETECTED Final   Streptococcus species DETECTED (A) NOT DETECTED Final    Comment: Not Enterococcus species, Streptococcus agalactiae, Streptococcus pyogenes, or Streptococcus pneumoniae. CRITICAL RESULT CALLED TO, READ BACK BY AND VERIFIED WITH: J ORIET PHARMD 10/22/18 2300 JDW    Streptococcus agalactiae NOT DETECTED NOT DETECTED Final   Streptococcus pneumoniae NOT DETECTED NOT DETECTED Final   Streptococcus pyogenes NOT DETECTED NOT DETECTED Final   Acinetobacter baumannii NOT DETECTED NOT DETECTED Final   Enterobacteriaceae species NOT DETECTED NOT DETECTED Final   Enterobacter cloacae complex NOT DETECTED NOT DETECTED Final   Escherichia coli NOT DETECTED NOT DETECTED Final   Klebsiella oxytoca NOT DETECTED NOT DETECTED Final   Klebsiella pneumoniae NOT DETECTED NOT DETECTED Final   Proteus species NOT DETECTED NOT  DETECTED Final   Serratia marcescens NOT DETECTED NOT DETECTED Final   Haemophilus influenzae NOT DETECTED NOT DETECTED Final   Neisseria meningitidis NOT DETECTED NOT DETECTED Final   Pseudomonas aeruginosa NOT DETECTED NOT DETECTED Final   Candida albicans NOT DETECTED NOT DETECTED Final   Candida glabrata NOT DETECTED NOT DETECTED Final   Candida  krusei NOT DETECTED NOT DETECTED Final   Candida parapsilosis NOT DETECTED NOT DETECTED Final   Candida tropicalis NOT DETECTED NOT DETECTED Final    Comment: Performed at [facility], [place] [address], [place], [place] [address]  Urine culture     Status: Abnormal   Collection Time: 10/22/18 11:22 AM  Result Value Ref Range Status   Specimen Description URINE, RANDOM  Final   Special Requests NONE  Final   Culture (A)  Final    <10,000 COLONIES/mL INSIGNIFICANT GROWTH Performed at [facility] [address], [place], [place] [address]    Report Status 10/23/2018 FINAL  Final  Blood culture (routine x 2)     Status: Abnormal   Collection Time: 10/22/18 11:23 AM  Result Value Ref Range Status   Specimen Description BLOOD RIGHT ANTECUBITAL  Final   Special Requests   Final    BOTTLES DRAWN AEROBIC AND ANAEROBIC Blood Culture adequate volume   Culture  Setup Time   Final    IN BOTH AEROBIC AND ANAEROBIC BOTTLES GRAM POSITIVE COCCI CRITICAL VALUE NOTED.  VALUE IS CONSISTENT WITH PREVIOUSLY REPORTED AND CALLED VALUE. Performed at [facility], [place] [address] [place], [place] [address]    Culture STREPTOCOCCUS GALLOLYTICUS (A)  Final   Report Status 10/24/2018 FINAL  Final   Organism ID, Bacteria STREPTOCOCCUS GALLOLYTICUS  Final      Susceptibility   Streptococcus gallolyticus - MIC*    PENICILLIN 0.12 SENSITIVE Sensitive     CEFTRIAXONE <=0.12 SENSITIVE Sensitive     ERYTHROMYCIN <=0.12 SENSITIVE Sensitive     LEVOFLOXACIN 2 SENSITIVE Sensitive     VANCOMYCIN <=0.12 SENSITIVE Sensitive     * STREPTOCOCCUS  GALLOLYTICUS  Respiratory Panel by PCR     Status: None   Collection Time: 10/22/18  5:44 PM  Result Value Ref Range Status   Adenovirus NOT DETECTED NOT DETECTED Final   Coronavirus 229E NOT DETECTED NOT DETECTED Final    Comment: (NOTE) The Coronavirus on the Respiratory Panel, DOES NOT test for the novel  Coronavirus (2019 nCoV)    Coronavirus HKU1 NOT DETECTED NOT DETECTED Final   Coronavirus NL63 NOT DETECTED NOT DETECTED Final   Coronavirus OC43 NOT DETECTED NOT DETECTED Final   Metapneumovirus NOT DETECTED NOT DETECTED Final   Rhinovirus / Enterovirus NOT DETECTED NOT DETECTED Final   Influenza A NOT DETECTED NOT DETECTED Final   Influenza B NOT DETECTED NOT DETECTED Final   Parainfluenza Virus 1 NOT DETECTED NOT DETECTED Final   Parainfluenza Virus 2 NOT DETECTED NOT DETECTED Final   Parainfluenza Virus 3 NOT DETECTED NOT DETECTED Final   Parainfluenza Virus 4 NOT DETECTED NOT DETECTED Final   Respiratory Syncytial Virus NOT DETECTED NOT DETECTED Final   Bordetella pertussis NOT DETECTED NOT DETECTED Final   Chlamydophila pneumoniae NOT DETECTED NOT DETECTED Final   Mycoplasma pneumoniae NOT DETECTED NOT DETECTED Final    Comment: Performed at [facility], [place]. [address], [place], [place] [address]  SARS Coronavirus 2 (CEPHEID- Performed in [facility] hospital lab), Hosp Order     Status: None   Collection Time: 10/22/18  5:44 PM  Result Value Ref Range Status   SARS Coronavirus 2 NEGATIVE NEGATIVE Final    Comment: (NOTE) If result is NEGATIVE SARS-CoV-2 target nucleic acids are NOT DETECTED. The SARS-CoV-2 RNA is generally detectable in upper and lower  respiratory specimens during the acute phase of infection. The lowest  concentration of  SARS-CoV-2 viral copies this assay can detect is 250  copies / mL. A negative result does not preclude SARS-CoV-2 infection  and should not be used as the sole basis for treatment or other  patient management decisions.  A  negative result may occur with  improper specimen collection / handling, submission of specimen other  than nasopharyngeal swab, presence of viral mutation(s) within the  areas targeted by this assay, and inadequate number of viral copies  (<250 copies / mL). A negative result must be combined with clinical  observations, patient history, and epidemiological information. If result is POSITIVE SARS-CoV-2 target nucleic acids are DETECTED. The SARS-CoV-2 RNA is generally detectable in upper and lower  respiratory specimens dur ing the acute phase of infection.  Positive  results are indicative of active infection with SARS-CoV-2.  Clinical  correlation with patient history and other diagnostic information is  necessary to determine patient infection status.  Positive results do  not rule out bacterial infection or co-infection with other viruses. If result is PRESUMPTIVE POSTIVE SARS-CoV-2 nucleic acids MAY BE PRESENT.   A presumptive positive result was obtained on the submitted specimen  and confirmed on repeat testing.  While 2019 novel coronavirus  (SARS-CoV-2) nucleic acids may be present in the submitted sample  additional confirmatory testing may be necessary for epidemiological  and / or clinical management purposes  to differentiate between  SARS-CoV-2 and other Sarbecovirus currently known to infect humans.  If clinically indicated additional testing with an alternate test  methodology [phone]) is advised. The SARS-CoV-2 RNA is generally  detectable in upper and lower respiratory sp ecimens during the acute  phase of infection. The expected result is Negative. Fact Sheet for Patients:  StrictlyIdeas.no Fact Sheet for Healthcare Providers: BankingDealers.co.za This test is not yet approved or cleared by the [place] FDA and has been authorized for detection and/or diagnosis of SARS-CoV-2 by FDA under an Emergency Use  Authorization (EUA).  This EUA will remain in effect (meaning this test can be used) for the duration of the COVID-19 declaration under Section 564(b)(1) of the Act, 21 U.S.C. section 360bbb-3(b)(1), unless the authorization is terminated or revoked sooner. Performed at [facility], [place] [address], [place], [place] [address]   Urine culture     Status: None   Collection Time: 10/22/18  8:29 PM  Result Value Ref Range Status   Specimen Description URINE, RANDOM  Final   Special Requests NONE  Final   Culture   Final    NO GROWTH Performed at [facility], [place] [address] [place], [place] [address]    Report Status 10/24/2018 FINAL  Final  Culture, blood (routine x 2)     Status: None (Preliminary result)   Collection Time: 10/23/18  7:45 AM  Result Value Ref Range Status   Specimen Description BLOOD LEFT ANTECUBITAL  Final   Special Requests   Final    BOTTLES DRAWN AEROBIC ONLY Blood Culture adequate volume   Culture   Final    NO GROWTH 2 DAYS Performed at [facility], [place] [address] [place], [place] [address]    Report Status PENDING  Incomplete  Culture, blood (routine x 2)     Status: None (Preliminary result)   Collection Time: 10/23/18  7:53 AM  Result Value Ref Range Status   Specimen Description BLOOD RIGHT HAND  Final   Special Requests   Final    BOTTLES DRAWN AEROBIC ONLY Blood Culture adequate  volume   Culture   Final    NO GROWTH 2 DAYS Performed at [facility], [place] [address] [place],  [address]    Report Status PENDING  Incomplete    Janene Madeira, MSN, NP-C [facility] for Infectious Disease [facility] Cell: [phone] Pager: [phone]  @TODAY @ 11:16 AM

## 2018-10-25 NOTE — Anesthesia Preprocedure Evaluation (Addendum)
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), + Cardiac Stents and + DOE  + pacemaker  Rhythm:Regular Rate:Normal  10/23/2018 ECHO: EF 50-55%, mild AI   Neuro/Psych negative neurological ROS     GI/Hepatic Neg liver ROS, GERD  Controlled and Medicated,  Endo/Other  Hypothyroidism   Renal/GU Renal InsufficiencyRenal disease     Musculoskeletal  (+) Arthritis ,   Abdominal   Peds  Hematology brilinta   Anesthesia Other Findings   Reproductive/Obstetrics                            Anesthesia Physical Anesthesia Plan  ASA: III  Anesthesia Plan: MAC   Post-op Pain Management:    Induction: Intravenous  PONV Risk Score and Plan: 1 and Treatment may vary due to age or medical condition  Airway Management Planned: Natural Airway and Nasal Cannula  Additional Equipment:   Intra-op Plan:   Post-operative Plan:   Informed Consent: I have reviewed the patients History and Physical, chart, labs and discussed the procedure including the risks, benefits and alternatives for the proposed anesthesia with the patient or authorized representative who has indicated his/her understanding and acceptance.     Dental advisory given  Plan Discussed with: CRNA and Surgeon  Anesthesia Plan Comments:         Anesthesia Quick Evaluation

## 2018-10-25 NOTE — Anesthesia Procedure Notes (Signed)
Procedure Name: MAC Date/Time: 10/25/2018 9:04 AM Performed by: Mariea Clonts, CRNA Pre-anesthesia Checklist: Patient identified, Emergency Drugs available, Suction available, Patient being monitored and Timeout performed Patient Re-evaluated:Patient Re-evaluated prior to induction Oxygen Delivery Method: Nasal cannula

## 2018-10-25 NOTE — Care Management Important Message (Signed)
Important Message  Patient Details  Name: [name] Glass MRN: [id number] Date of Birth: August 31, 1940   Medicare Important Message Given:  Yes    [name] 10/25/2018, 12:30 PM

## 2018-10-25 NOTE — Interval H&P Note (Signed)
History and Physical Interval Note:  10/25/2018 8:33 AM  [name] Glass  has presented today for surgery, with the diagnosis of bacteremia.  The various methods of treatment have been discussed with the patient and family. After consideration of risks, benefits and other options for treatment, the patient has consented to  Procedure(s): TRANSESOPHAGEAL ECHOCARDIOGRAM (TEE) (N/A) as a surgical intervention.  The patient's history has been reviewed, patient examined, no change in status, stable for surgery.  I have reviewed the patient's chart and labs.  Questions were answered to the patient's satisfaction.     Mertie Moores

## 2018-10-25 NOTE — Progress Notes (Addendum)
PROGRESS NOTE  CARLY APPLEGATE EZM:[id number] DOB: [birth year] DOA: 10/22/2018 PCP: [name], MD  HPI/Recap of past 24 hours: [name] a 78 y.o.malewith history h/ohyperlipidemia, hypertension, CAD status post PTCA x 6, with last stent in January, GERD, arthritis, hiatal hernia, sleep apnea presents to the ED today with complaints of productive cough and dyspnea. Patient states he has had on and off symptoms of bronchitis and received at least 2 courses of antibiotics within the last 3 months. On presentation to the ED he was noted to be febrile, tachycardic, dyspneic and hypoxic requiring 2 L of nasal cannula.Labs revealed elevated lactate at 4, sepsis protocol was initiated. Patient received IV fluids broad-spectrum antibiotics and underwent COVID-19 work-up as well as CT chest to rule out PE/pneumonia which were all unremarkable. Patient lives with his wife and denies any sick contacts. Skin redness noted along his trunk and face which he relates to sunburn.He denies any chest pain or leg swellings or headache or dizziness. He does report low back pain and points to right iliac/paraspinal area. He does have a history of kidney stones but does not feel this pain is like prior episodes and denies any dysuria/hematuria. He drinks scotch daily but denies any history of alcohol withdrawal.  Patient does have long history of CAD and had 5 stents placed more than 10 years back by Dr [name]. He reports undergoing a stress test in [place] at one point which was apparently good.He was in [facility] and had an episode of syncope.He ultimately was transported by EMS to [facility] where he underwent cardiac catheterizationfor NSTEMI revealinghigh-grade calcified LAD stenosis requiring DESimplantation in Jan 2020.He has remained on aspirin and Brilinta since then.He also was noted to have long pauses on telemetry with recurrent  syncopeduring that hospitalizationand andunderwentpermanent pacemaker implantation.When seen by Dr. Alvester Chou for follow-up on January 28, patient still had pocket staples in. He currently has tenderness at pacemaker site as well as a dry whitish scab (which was also noted at follow-up visit with cardiologist in March). Patient denies noticing any discharge.He did receive a course of abx (doxycycline) in January for cellulitis (apparently hadrt forearm cellulitis, rtknee injury from fall at [facility]) and Omnicef in March (for pharyngitis/bronchitis)  10/25/18: Patient seen and examined at his bedside.  Reports his dyspnea is improved compared to when he first came in.  States he has had no chest pain.  TEE done this morning showing no valvular vegetation or on his ICD device.  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  Planned extraction of ICD device this afternoon.  Will obtain blood cultures x2 peripherally post removal.  Repeat blood cultures done on 10/23/2018- to date.  Infectious disease following.  Assessment/Plan: Principal Problem:   Streptococcal Gallolyticus Bacteremia Active Problems:   Coronary artery disease   OA (osteoarthritis) of knee   Gastroesophageal reflux disease without esophagitis   Essential hypertension, benign   Hyperlipidemia with target LDL less than 70   Chronic renal disease, stage 2, mildly decreased glomerular filtration rate (GFR) between 60-89 mL/min/1.73 square meter   Sepsis ([place])   H/O splenectomy   Presence of permanent cardiac pacemaker   Sleep apnea   Acute low back pain   Chronic bronchitis (HCC)  Sepsis secondary to strep Gallolyticus bacteremia Presented with fever with T-max of 102.5, tachycardia with heart rate 133, tachypnea with respiration rate 32 Blood cultures drawn on 10/22/2018+ for strep G Repeat blood culture done on 10/23/2018- to  date Patient with history of splenectomy 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  Will need to follow-up outpatient with GI. Post TEE on 10/25/2018 no valvular vegetation or ICD vegetation.  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 Continue penicillin G Will need PICC line for long-term IV antibiotics Infectious disease following  Acute hypoxic respiratory failure Work-up unrevealing CTA PE negative Chest x-ray unremarkable Brilinta has been discontinued due to her shortness of breath, started on Plavix Wean oxygen supplement as tolerated Home O2 eval prior to discharge  Elevated troponin likely demand ischemia Troponin peaked at 0.27 and trended down Ectopic atrial tachycardia on twelve-lead EKG with QTC 537 Cardiology following  Prolonged QTC Presented with QTC 537 Magnesium normal Repeat twelve-lead EKG  Coronary artery disease status post PCI with stenting Brilinta discontinued due to shortness of breath and hypoxia On aspirin and Plavix Continue Zetia Cardiology following  PFO, incidentally found on TEE done on 10/25/2018  History of splenectomy Molecular stated above Blood culture will be repeated this afternoon on 10/25/2018  History of sick sinus syndrome Status post pacemaker  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  Strep bacteremia. Requires continued ID eval, TEE, IV abx.   Consultants:   ID  Cardiology  Procedures: Echo    Objective: Vitals:   10/25/18 0927 10/25/18 0933 10/25/18 0934 10/25/18 0938  BP: 115/68 108/77  122/68  Pulse: 64 (!) 25  61  Resp: 18 (!) 21 (!) 23 (!) 23  Temp: 97.9 F (36.6 C)       TempSrc: Oral     SpO2: 93% 96% 97% 95%  Weight:      Height:        Intake/Output Summary (Last 24 hours) at 10/25/2018 1144 Last data filed at 10/25/2018 1052 Gross per 24 hour  Intake 1776.35 ml  Output 4850 ml  Net -3073.65 ml   Filed Weights   10/24/18 0458 10/25/18 0530 10/25/18 0820  Weight: 99 kg 96.3 kg 96.3 kg    Exam:   General: 78 y.o. year-old male well developed well nourished in no acute distress.  Alert and oriented x3.  Cardiovascular: Regular rate and rhythm with no rubs or gallops.  No thyromegaly or JVD noted.    Respiratory: Clear to auscultation with no wheezes or rales. Good inspiratory effort.  Abdomen: Soft nontender nondistended with normal bowel sounds x4 quadrants.  Musculoskeletal: Trace lower extremity edema. 2/4 pulses in all 4 extremities.  Psychiatry: Mood is appropriate for condition and setting   Data Reviewed: CBC: Recent Labs  Lab 10/22/18 1050 10/22/18 2304 10/23/18 0206 10/24/18 0207 10/25/18 0430  WBC 5.0 18.5* 22.1* 24.1* 19.8*  NEUTROABS 3.7  --   --   --   --   HGB 18.2* 16.8 15.9 14.4 15.2  HCT 54.3* 49.1 46.0 41.7 44.1  MCV 93.1 91.9 92.2 91.6 90.4  PLT 261 199 177 158 353   Basic Metabolic Panel: Recent Labs  Lab 10/22/18 1050 10/22/18 2015 10/23/18 0206 10/24/18 0207 10/25/18 0430  NA 141  --  141 139 142  K 4.4  --  4.3 3.9 4.1  CL 108  --  111 108 110  CO2 17*  --  18* 22 25  GLUCOSE 162*  --  81 99 108*  BUN 13  --  26* 23 18  CREATININE 1.24 1.97* 1.93* 1.31* 1.17  CALCIUM 10.3  --  9.3 9.0 9.6  MG  --   --   --  1.4* 1.9   GFR: Estimated Creatinine Clearance: 62.6 mL/min (by C-G formula based on SCr of 1.17 mg/dL). Liver Function Tests: Recent Labs  Lab 10/22/18 1050 10/24/18 0207 10/25/18 0430  AST 43* 34 25  ALT 25 31 29   ALKPHOS 66 43 53  BILITOT 1.8* 1.0 1.0  PROT 7.1 5.3* 5.7*  ALBUMIN 3.7 2.6* 2.8*   No results for input(s): LIPASE, AMYLASE in the last 168 hours. No results  for input(s): AMMONIA in the last 168 hours. Coagulation Profile: No results for input(s): INR, PROTIME in the last 168 hours. Cardiac Enzymes: Recent Labs  Lab 10/22/18 1050 10/22/18 2015 10/23/18 0206 10/23/18 0730  TROPONINI 0.07* 0.26* 0.27* 0.20*   BNP (last 3 results) No results for input(s): PROBNP in the last 8760 hours. HbA1C: No results for input(s): HGBA1C in the last 72 hours. CBG: No results for input(s): GLUCAP in the last 168 hours. Lipid Profile: No results for input(s): CHOL, HDL, LDLCALC, TRIG, CHOLHDL, LDLDIRECT in the last 72 hours. Thyroid Function Tests: No results for input(s): TSH, T4TOTAL, FREET4, T3FREE, THYROIDAB in the last 72 hours. Anemia Panel: No results for input(s): VITAMINB12, FOLATE, FERRITIN, TIBC, IRON, RETICCTPCT in the last 72 hours. Urine analysis:    Component Value Date/Time   COLORURINE YELLOW 10/22/2018 1122   APPEARANCEUR HAZY (A) 10/22/2018 1122   LABSPEC 1.010 10/22/2018 1122   PHURINE 5.0 10/22/2018 1122   GLUCOSEU NEGATIVE 10/22/2018 1122   GLUCOSEU NEGATIVE 06/22/2017 1116   HGBUR SMALL (A) 10/22/2018 1122   BILIRUBINUR NEGATIVE 10/22/2018 1122   KETONESUR NEGATIVE 10/22/2018 1122   PROTEINUR 100 (A) 10/22/2018 1122   UROBILINOGEN 0.2 06/22/2017 1116   NITRITE NEGATIVE 10/22/2018 1122   LEUKOCYTESUR NEGATIVE 10/22/2018 1122   Sepsis Labs: @LABRCNTIP (PYPPJKDTOIZTI:4,PYKDXIPJASN:0)  ) Recent Results (from the past 240 hour(s))  SARS Coronavirus 2 (CEPHEID- Performed in [place] hospital lab), Hosp Order     Status: None   Collection Time: 10/22/18 10:47 AM  Result Value Ref Range Status   SARS Coronavirus 2 NEGATIVE NEGATIVE Final    Comment: (NOTE) If result is NEGATIVE SARS-CoV-2 target nucleic acids are NOT DETECTED. The SARS-CoV-2 RNA is generally detectable in upper and lower  respiratory specimens during the acute phase of infection. The lowest  concentration of SARS-CoV-2 viral copies this assay can  detect is 250  copies / mL. A negative result does not preclude SARS-CoV-2 infection  and should not be used as the sole basis for treatment or other  patient management decisions.  A negative result may occur with  improper specimen collection / handling, submission of specimen other  than nasopharyngeal swab, presence of viral mutation(s) within the  areas targeted by this assay, and inadequate number of viral copies  (<250 copies / mL). A negative result must be combined with clinical  observations, patient history, and epidemiological information. If result is POSITIVE SARS-CoV-2 target nucleic acids are DETECTED. The SARS-CoV-2 RNA is generally detectable in upper and lower  respiratory specimens dur ing the acute phase of infection.  Positive  results are indicative of active infection with SARS-CoV-2.  Clinical  correlation with patient history and other diagnostic information is  necessary to determine patient infection status.  Positive results do  not rule out bacterial infection or co-infection with other viruses. If result is PRESUMPTIVE POSTIVE SARS-CoV-2 nucleic acids MAY BE PRESENT.   A presumptive positive result was obtained on the submitted specimen  and confirmed on repeat testing.  While 2019 novel coronavirus  (SARS-CoV-2) nucleic acids may be present in the submitted sample  additional confirmatory testing may be necessary for epidemiological  and / or clinical management purposes  to differentiate between  SARS-CoV-2 and other Sarbecovirus currently known to infect humans.  If clinically indicated additional testing with an alternate test  methodology [phone]) is advised. The SARS-CoV-2 RNA is generally  detectable in upper and lower respiratory sp ecimens during the acute  phase of infection. The expected result is Negative. Fact Sheet for Patients:  StrictlyIdeas.no Fact Sheet for Healthcare  Providers: BankingDealers.co.za This test is not yet approved or cleared by the [place] FDA and has been authorized for detection and/or diagnosis of SARS-CoV-2 by FDA under an Emergency Use Authorization (EUA).  This EUA will remain in effect (meaning this test can be used) for the duration of the COVID-19 declaration under Section 564(b)(1) of the Act, 21 U.S.C. section 360bbb-3(b)(1), unless the authorization is terminated or revoked sooner. Performed at [facility], [place] [address], [place], [place] [address]   Blood culture (routine x 2)     Status: Abnormal   Collection Time: 10/22/18 10:52 AM  Result Value Ref Range Status   Specimen Description BLOOD BLOOD LEFT FOREARM  Final   Special Requests   Final    BOTTLES DRAWN AEROBIC AND ANAEROBIC Blood Culture adequate volume   Culture  Setup Time   Final    IN BOTH AEROBIC AND ANAEROBIC BOTTLES GRAM POSITIVE COCCI CRITICAL RESULT CALLED TO, READ BACK BY AND VERIFIED WITH: J ORIET PHARMD 10/22/18 2300 JDW    Culture (A)  Final    STREPTOCOCCUS GALLOLYTICUS SUSCEPTIBILITIES PERFORMED ON PREVIOUS CULTURE WITHIN THE LAST 5 DAYS. Performed at [facility], [place] [address], [place], [place] [address]    Report Status 10/24/2018 FINAL  Final  Blood Culture ID Panel (Reflexed)     Status: Abnormal   Collection Time: 10/22/18 10:52 AM  Result Value Ref Range Status   Enterococcus species NOT DETECTED NOT DETECTED Final   Listeria monocytogenes NOT DETECTED NOT DETECTED Final   Staphylococcus species NOT DETECTED NOT DETECTED Final   Staphylococcus aureus (BCID) NOT DETECTED NOT DETECTED Final   Streptococcus species DETECTED (A) NOT DETECTED Final    Comment: Not Enterococcus species, Streptococcus agalactiae, Streptococcus pyogenes, or Streptococcus pneumoniae. CRITICAL RESULT CALLED TO, READ BACK BY AND VERIFIED WITH: J ORIET PHARMD 10/22/18 2300 JDW    Streptococcus agalactiae NOT DETECTED NOT  DETECTED Final   Streptococcus pneumoniae NOT DETECTED NOT DETECTED Final   Streptococcus pyogenes NOT DETECTED NOT DETECTED Final   Acinetobacter baumannii NOT DETECTED NOT DETECTED Final   Enterobacteriaceae species NOT DETECTED NOT DETECTED Final   Enterobacter cloacae complex NOT DETECTED NOT DETECTED Final   Escherichia coli NOT DETECTED NOT DETECTED Final   Klebsiella oxytoca NOT DETECTED NOT DETECTED Final   Klebsiella pneumoniae NOT DETECTED NOT DETECTED Final   Proteus species NOT DETECTED NOT DETECTED Final   Serratia marcescens NOT DETECTED NOT DETECTED Final   Haemophilus influenzae NOT DETECTED NOT DETECTED Final   Neisseria meningitidis NOT DETECTED NOT DETECTED Final   Pseudomonas aeruginosa NOT DETECTED NOT DETECTED Final  Candida albicans NOT DETECTED NOT DETECTED Final   Candida glabrata NOT DETECTED NOT DETECTED Final   Candida krusei NOT DETECTED NOT DETECTED Final   Candida parapsilosis NOT DETECTED NOT DETECTED Final   Candida tropicalis NOT DETECTED NOT DETECTED Final    Comment: Performed at [facility], [place] [address], [place], [place] [address]  Urine culture     Status: Abnormal   Collection Time: 10/22/18 11:22 AM  Result Value Ref Range Status   Specimen Description URINE, RANDOM  Final   Special Requests NONE  Final   Culture (A)  Final    <10,000 COLONIES/mL INSIGNIFICANT GROWTH Performed at [place] [address] [place], [place] [address]    Report Status 10/23/2018 FINAL  Final  Blood culture (routine x 2)     Status: Abnormal   Collection Time: 10/22/18 11:23 AM  Result Value Ref Range Status   Specimen Description BLOOD RIGHT ANTECUBITAL  Final   Special Requests   Final    BOTTLES DRAWN AEROBIC AND ANAEROBIC Blood Culture adequate volume   Culture  Setup Time   Final    IN BOTH AEROBIC AND ANAEROBIC BOTTLES GRAM POSITIVE COCCI CRITICAL VALUE NOTED.  VALUE IS CONSISTENT WITH PREVIOUSLY REPORTED AND CALLED  VALUE. Performed at [facility], [place] [address] [place], [place] [address]    Culture STREPTOCOCCUS GALLOLYTICUS (A)  Final   Report Status 10/24/2018 FINAL  Final   Organism ID, Bacteria STREPTOCOCCUS GALLOLYTICUS  Final      Susceptibility   Streptococcus gallolyticus - MIC*    PENICILLIN 0.12 SENSITIVE Sensitive     CEFTRIAXONE <=0.12 SENSITIVE Sensitive     ERYTHROMYCIN <=0.12 SENSITIVE Sensitive     LEVOFLOXACIN 2 SENSITIVE Sensitive     VANCOMYCIN <=0.12 SENSITIVE Sensitive     * STREPTOCOCCUS GALLOLYTICUS  Respiratory Panel by PCR     Status: None   Collection Time: 10/22/18  5:44 PM  Result Value Ref Range Status   Adenovirus NOT DETECTED NOT DETECTED Final   Coronavirus 229E NOT DETECTED NOT DETECTED Final    Comment: (NOTE) The Coronavirus on the Respiratory Panel, DOES NOT test for the novel  Coronavirus (2019 nCoV)    Coronavirus HKU1 NOT DETECTED NOT DETECTED Final   Coronavirus NL63 NOT DETECTED NOT DETECTED Final   Coronavirus OC43 NOT DETECTED NOT DETECTED Final   Metapneumovirus NOT DETECTED NOT DETECTED Final   Rhinovirus / Enterovirus NOT DETECTED NOT DETECTED Final   Influenza A NOT DETECTED NOT DETECTED Final   Influenza B NOT DETECTED NOT DETECTED Final   Parainfluenza Virus 1 NOT DETECTED NOT DETECTED Final   Parainfluenza Virus 2 NOT DETECTED NOT DETECTED Final   Parainfluenza Virus 3 NOT DETECTED NOT DETECTED Final   Parainfluenza Virus 4 NOT DETECTED NOT DETECTED Final   Respiratory Syncytial Virus NOT DETECTED NOT DETECTED Final   Bordetella pertussis NOT DETECTED NOT DETECTED Final   Chlamydophila pneumoniae NOT DETECTED NOT DETECTED Final   Mycoplasma pneumoniae NOT DETECTED NOT DETECTED Final    Comment: Performed at [facility], [place]. [address] [place], [place] [address]  SARS Coronavirus 2 (CEPHEID- Performed in [place] hospital lab), Hosp Order     Status: None   Collection Time: 10/22/18  5:44 PM  Result Value Ref Range  Status   SARS Coronavirus 2 NEGATIVE NEGATIVE Final    Comment: (NOTE) If result is NEGATIVE SARS-CoV-2 target nucleic acids are NOT DETECTED. The SARS-CoV-2 RNA is  generally detectable in upper and lower  respiratory specimens during the acute phase of infection. The lowest  concentration of SARS-CoV-2 viral copies this assay can detect is 250  copies / mL. A negative result does not preclude SARS-CoV-2 infection  and should not be used as the sole basis for treatment or other  patient management decisions.  A negative result may occur with  improper specimen collection / handling, submission of specimen other  than nasopharyngeal swab, presence of viral mutation(s) within the  areas targeted by this assay, and inadequate number of viral copies  (<250 copies / mL). A negative result must be combined with clinical  observations, patient history, and epidemiological information. If result is POSITIVE SARS-CoV-2 target nucleic acids are DETECTED. The SARS-CoV-2 RNA is generally detectable in upper and lower  respiratory specimens dur ing the acute phase of infection.  Positive  results are indicative of active infection with SARS-CoV-2.  Clinical  correlation with patient history and other diagnostic information is  necessary to determine patient infection status.  Positive results do  not rule out bacterial infection or co-infection with other viruses. If result is PRESUMPTIVE POSTIVE SARS-CoV-2 nucleic acids MAY BE PRESENT.   A presumptive positive result was obtained on the submitted specimen  and confirmed on repeat testing.  While 2019 novel coronavirus  (SARS-CoV-2) nucleic acids may be present in the submitted sample  additional confirmatory testing may be necessary for epidemiological  and / or clinical management purposes  to differentiate between  SARS-CoV-2 and other Sarbecovirus currently known to infect humans.  If clinically indicated additional testing with an alternate  test  methodology [phone]) is advised. The SARS-CoV-2 RNA is generally  detectable in upper and lower respiratory sp ecimens during the acute  phase of infection. The expected result is Negative. Fact Sheet for Patients:  StrictlyIdeas.no Fact Sheet for Healthcare Providers: BankingDealers.co.za This test is not yet approved or cleared by the [place] FDA and has been authorized for detection and/or diagnosis of SARS-CoV-2 by FDA under an Emergency Use Authorization (EUA).  This EUA will remain in effect (meaning this test can be used) for the duration of the COVID-19 declaration under Section 564(b)(1) of the Act, 21 U.S.C. section 360bbb-3(b)(1), unless the authorization is terminated or revoked sooner. Performed at [facility], [place] [address] [place], [place] [address]   Urine culture     Status: None   Collection Time: 10/22/18  8:29 PM  Result Value Ref Range Status   Specimen Description URINE, RANDOM  Final   Special Requests NONE  Final   Culture   Final    NO GROWTH Performed at [facility], [place] [address], [place], [place] [address]    Report Status 10/24/2018 FINAL  Final  Culture, blood (routine x 2)     Status: None (Preliminary result)   Collection Time: 10/23/18  7:45 AM  Result Value Ref Range Status   Specimen Description BLOOD LEFT ANTECUBITAL  Final   Special Requests   Final    BOTTLES DRAWN AEROBIC ONLY Blood Culture adequate volume   Culture   Final    NO GROWTH 2 DAYS Performed at [facility], [place] [address] [place], [place] [address]    Report Status PENDING  Incomplete  Culture, blood (routine x 2)     Status: None (Preliminary result)   Collection Time: 10/23/18  7:53 AM  Result Value Ref Range Status   Specimen Description BLOOD RIGHT HAND  Final   Special Requests   Final    BOTTLES DRAWN AEROBIC ONLY Blood Culture adequate volume   Culture   Final    NO GROWTH 2  DAYS Performed at [facility], [address], [place], [place] [address]    Report Status PENDING  Incomplete      Studies: Dg Chest 2 View  Result Date: 10/24/2018 CLINICAL DATA:  Hypoxia EXAM: CHEST - 2 VIEW COMPARISON:  10/22/2018 FINDINGS: Cardiac shadow is enlarged but stable. Pacing device is again seen and stable. The lungs are well aerated without focal infiltrate. No sizable effusion is noted. No acute bony abnormality is seen. IMPRESSION: No acute abnormality noted.  No change from the previous exam. Electronically Signed   By: Inez Catalina M.D.   On: 10/24/2018 15:30    Scheduled Meds:  anastrozole  1 mg Oral Once per day on Mon Thu   aspirin EC  81 mg Oral Daily   ezetimibe  10 mg Oral Daily   fluticasone  2 spray Each Nare Daily   ipratropium-albuterol  3 mL Nebulization BID   pantoprazole  40 mg Oral Daily   pravastatin  40 mg Oral q1800   sodium chloride flush  3 mL Intravenous Q12H   tamsulosin  0.4 mg Oral QPC breakfast   thyroid  120 mg Oral QAC breakfast   thyroid  60 mg Oral QPM    Continuous Infusions:  penicillin g continuous IV infusion 12 Million Units (10/25/18 0240)     LOS: 3 days     Kayleen Memos, MD [facility] Pager [phone]  If 7PM-7AM, please contact night-coverage [email or web address] Password [facility] 10/25/2018, 11:44 AM

## 2018-10-25 NOTE — Anesthesia Postprocedure Evaluation (Signed)
Anesthesia Post Note  Patient: [name] Glass  Procedure(s) Performed: TRANSESOPHAGEAL ECHOCARDIOGRAM (TEE) (N/A )     Patient location during evaluation: Endoscopy Anesthesia Type: MAC Level of consciousness: awake and alert, patient cooperative and oriented Pain management: pain level controlled Vital Signs Assessment: post-procedure vital signs reviewed and stable Respiratory status: spontaneous breathing, nonlabored ventilation, respiratory function stable and patient connected to nasal cannula oxygen Cardiovascular status: blood pressure returned to baseline and stable Postop Assessment: no apparent nausea or vomiting Anesthetic complications: no    Last Vitals:  Vitals:   10/25/18 0934 10/25/18 0938  BP:  122/68  Pulse:  61  Resp: (!) 23 (!) 23  Temp:    SpO2: 97% 95%    Last Pain:  Vitals:   10/25/18 0938  TempSrc:   PainSc: 0-No pain                 Bluford Sedler,E. Moriah Shawley

## 2018-10-25 NOTE — H&P (View-Only) (Signed)
Progress Note  Patient Name: [name] Glass Date of Encounter: 10/25/2018  Primary Cardiologist: Dr. [name] Electrophysiologist: Dr. Caryl Comes  Subjective   Feels much better then at time of admission, remains a bit SOB, though looks better  Inpatient Medications    Scheduled Meds: . anastrozole  1 mg Oral Once per day on Mon Thu  . aspirin EC  81 mg Oral Daily  . ezetimibe  10 mg Oral Daily  . fluticasone  2 spray Each Nare Daily  . ipratropium-albuterol  3 mL Nebulization BID  . pantoprazole  40 mg Oral Daily  . pravastatin  40 mg Oral q1800  . sodium chloride flush  3 mL Intravenous Q12H  . tamsulosin  0.4 mg Oral QPC breakfast  . thyroid  120 mg Oral QAC breakfast  . thyroid  60 mg Oral QPM   Continuous Infusions: . sodium chloride 20 mL/hr at 10/25/18 0057  . penicillin g continuous IV infusion 12 Million Units (10/25/18 0240)   PRN Meds: acetaminophen, albuterol, HYDROmorphone (DILAUDID) injection, ondansetron **OR** ondansetron (ZOFRAN) IV, oxyCODONE   Vital Signs    Vitals:   10/24/18 1000 10/24/18 1908 10/24/18 1925 10/25/18 0530  BP: 123/77  131/81 128/87  Pulse: 60  60 71  Resp:    18  Temp:   98.1 F (36.7 C) 98.3 F (36.8 C)  TempSrc:   Oral Oral  SpO2: 97% 97% 95% 98%  Weight:    96.3 kg  Height:        Intake/Output Summary (Last 24 hours) at 10/25/2018 0737 Last data filed at 10/25/2018 6578 Gross per 24 hour  Intake 3409.54 ml  Output 4475 ml  Net -1065.46 ml   Last 3 Weights 10/25/2018 10/24/2018 10/23/2018  Weight (lbs) 212 lb 4.8 oz 218 lb 3.2 oz 215 lb  Weight (kg) 96.299 kg 98.975 kg 97.523 kg      Telemetry    SR, often A pacing, intermittent AV pacing - Personally Reviewed  ECG    No new EKGs - Personally Reviewed  Physical Exam   GEN: No acute distress.   Neck: No JVD Cardiac: RRR, no murmurs, rubs, or gallops.  Respiratory: Clear to auscultation bilaterally. GI: Soft, nontender, non-distended  MS: No edema; No deformity.  Neuro:  Nonfocal  Psych: Normal affect   Labs    Chemistry Recent Labs  Lab 10/22/18 1050  10/23/18 0206 10/24/18 0207 10/25/18 0430  NA 141  --  141 139 142  K 4.4  --  4.3 3.9 4.1  CL 108  --  111 108 110  CO2 17*  --  18* 22 25  GLUCOSE 162*  --  81 99 108*  BUN 13  --  26* 23 18  CREATININE 1.24   < > 1.93* 1.31* 1.17  CALCIUM 10.3  --  9.3 9.0 9.6  PROT 7.1  --   --  5.3* 5.7*  ALBUMIN 3.7  --   --  2.6* 2.8*  AST 43*  --   --  34 25  ALT 25  --   --  31 29  ALKPHOS 66  --   --  43 53  BILITOT 1.8*  --   --  1.0 1.0  GFRNONAA 55*   < > 32* 52* 59*  GFRAA >60   < > 38* >60 >60  ANIONGAP 16*  --  12 9 7    < > = values in this interval not displayed.     Hematology  Recent Labs  Lab 10/23/18 0206 10/24/18 0207 10/25/18 0430  WBC 22.1* 24.1* 19.8*  RBC 4.99 4.55 4.88  HGB 15.9 14.4 15.2  HCT 46.0 41.7 44.1  MCV 92.2 91.6 90.4  MCH 31.9 31.6 31.1  MCHC 34.6 34.5 34.5  RDW 18.6* 18.6* 18.4*  PLT 177 158 161    Cardiac Enzymes Recent Labs  Lab 10/22/18 1050 10/22/18 2015 10/23/18 0206 10/23/18 0730  TROPONINI 0.07* 0.26* 0.27* 0.20*   No results for input(s): TROPIPOC in the last 168 hours.   BNP Recent Labs  Lab 10/22/18 1051  BNP 60.5     DDimer No results for input(s): DDIMER in the last 168 hours.   Radiology      Cardiac Studies   10/23/2018: TTE IMPRESSIONS 1. The left ventricle has low normal systolic function, with an ejection fraction of 50-55%. The cavity size was normal. There is moderate concentric left ventricular hypertrophy. Left ventricular diastolic Doppler parameters are consistent with  pseudonormalization. Indeterminate filling pressures. 2. The right ventricle has normal systolic function. The cavity was normal. There is no increase in right ventricular wall thickness. Right ventricular systolic pressure could not be assessed. 3. Left atrial size was severely dilated. 4. The aortic valve has an indeterminate number  of cusps. Moderate thickening of the aortic valve. Moderate calcification of the aortic valve. Aortic valve regurgitation is mild by color flow Doppler. Mild stenosis of the aortic valve. 5. The inferior vena cava was dilated in size with <50% respiratory variability.  FINDINGS Left Ventricle: The left ventricle has low normal systolic function, with an ejection fraction of 50-55%. The cavity size was normal. There is moderate concentric left ventricular hypertrophy. Left ventricular diastolic Doppler parameters are consistent with pseudonormalization. Indeterminate filling pressures  Right Ventricle: The right ventricle has normal systolic function. The cavity was normal. There is no increase in right ventricular wall thickness. Right ventricular systolic pressure could not be assessed.  Left Atrium: Left atrial size was severely dilated.  Right Atrium: Right atrial size was normal in size. Right atrial pressure is estimated at 10 mmHg.  Interatrial Septum: No atrial level shunt detected by color flow Doppler.  Pericardium: There is no evidence of pericardial effusion.  Mitral Valve: The mitral valve is normal in structure. Mitral valve regurgitation is mild by color flow Doppler.  Tricuspid Valve: The tricuspid valve is normal in structure. Tricuspid valve regurgitation is trivial by color flow Doppler.  Aortic Valve: The aortic valve has an indeterminate number of cusps Moderate thickening of the aortic valve, with moderately decreased cusp excursion. Moderate calcification of the aortic valve. Aortic valve regurgitation is mild by color flow Doppler.  There is Mild stenosis of the aortic valve.  Pulmonic Valve: The pulmonic valve was normal in structure. Pulmonic valve regurgitation was not assessed by color flow Doppler.  Venous: The inferior vena cava is dilated in size with less than 50% respiratory variability.    Patient Profile     78 y.o. male with a hx of  CAD (number of stents over the years, most recent PCI Jan 2020) , HTN, HLD, hypothyroidism, and sinus node dysfunction w/PPM admitted with sepsis found to have strep bacteremia  He is feeling better since being on antibiotics He remains SOB, perhaps better  Device information MDT dual chamber PPM implanted 06/22/2018  Assessment & Plan    1. Sepsis     Strep bacteremia, Streptococcus species     ID on case  We plan to place temp-perm  pacing system s/p device removal this afternoon, [name] Glass need guidance from ID on time to re-implant a new permanent system  2. 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        No tethering      No fluctuation      No pain  He has generalized erythema throughout his torso, and arms, he reports ast weekend sufferring a significant sunburn      3. PPM     Implant indication was syncope/sinus node dysfunction     57% AP     6% VP      TEE today, if no vegetation or no large vegetation, plan for PPM removal this afternoon [name] Glass has seen and examined the patient, discussed rational for system removal and plans for temp-perm pacer insertion.  Discussed potential benefits and risks of planned procedures, and particularly bleeding given Brilinta/plavix The patient is agreeable   4. SOB     Exam does not suggest fluid OL     Pt reports this as chronic, dating back to Jan, why brilinta is suspect as cause     Today he suggests perhaps even longer then that     Changed from [place] to Plavix yesterday by cards        For questions or updates, please contact [facility] Please consult [email or web address] for contact info under        Signed, [name] Jamaica, [name] Glass  10/25/2018, 7:37 AM    I have seen and examined this patient with [name] Glass.  Agree with above, note added to reflect my findings.  On exam, RRR, no  murmurs, lungs clear. Patient with pacemaker for SSS and syncope. Pacemaker appears infected. [name] Glass thus plan for device extraction. [name] Glass reimplant temp/perm device as he has had syncope. [name] Glass get a TEE this AM to rule out endocarditis.  [name] Glass has presented today for surgery, with the diagnosis of pacemaker infection.  The various methods of treatment have been discussed with the patient and family. After consideration of risks, benefits and other options for treatment, the patient has consented to  Procedure(s): Pacemaker and lead extraction as a surgical intervention .  Risks include but not limited to bleeding, tamponade, infection, among others. The patient's history has been reviewed, patient examined, no change in status, stable for surgery.  I have reviewed the patient's chart and labs.  Questions were answered to the patient's satisfaction.      [name] Glass M. Emmett Bracknell MD 10/25/2018 7:50 AM

## 2018-10-25 NOTE — Progress Notes (Signed)
Patient refused use of bipap for the evening. Will continue to monitor pt.

## 2018-10-25 NOTE — Progress Notes (Addendum)
Progress Note  Patient Name: [name] Glass Date of Encounter: 10/25/2018  Primary Cardiologist: Dr. [name] Electrophysiologist: Dr. Caryl Comes  Subjective   Feels much better then at time of admission, remains a bit SOB, though looks better  Inpatient Medications    Scheduled Meds: . anastrozole  1 mg Oral Once per day on Mon Thu  . aspirin EC  81 mg Oral Daily  . ezetimibe  10 mg Oral Daily  . fluticasone  2 spray Each Nare Daily  . ipratropium-albuterol  3 mL Nebulization BID  . pantoprazole  40 mg Oral Daily  . pravastatin  40 mg Oral q1800  . sodium chloride flush  3 mL Intravenous Q12H  . tamsulosin  0.4 mg Oral QPC breakfast  . thyroid  120 mg Oral QAC breakfast  . thyroid  60 mg Oral QPM   Continuous Infusions: . sodium chloride 20 mL/hr at 10/25/18 0057  . penicillin g continuous IV infusion 12 Million Units (10/25/18 0240)   PRN Meds: acetaminophen, albuterol, HYDROmorphone (DILAUDID) injection, ondansetron **OR** ondansetron (ZOFRAN) IV, oxyCODONE   Vital Signs    Vitals:   10/24/18 1000 10/24/18 1908 10/24/18 1925 10/25/18 0530  BP: 123/77  131/81 128/87  Pulse: 60  60 71  Resp:    18  Temp:   98.1 F (36.7 C) 98.3 F (36.8 C)  TempSrc:   Oral Oral  SpO2: 97% 97% 95% 98%  Weight:    96.3 kg  Height:        Intake/Output Summary (Last 24 hours) at 10/25/2018 0737 Last data filed at 10/25/2018 0940 Gross per 24 hour  Intake 3409.54 ml  Output 4475 ml  Net -1065.46 ml   Last 3 Weights 10/25/2018 10/24/2018 10/23/2018  Weight (lbs) 212 lb 4.8 oz 218 lb 3.2 oz 215 lb  Weight (kg) 96.299 kg 98.975 kg 97.523 kg      Telemetry    SR, often A pacing, intermittent AV pacing - Personally Reviewed  ECG    No new EKGs - Personally Reviewed  Physical Exam   GEN: No acute distress.   Neck: No JVD Cardiac: RRR, no murmurs, rubs, or gallops.  Respiratory: Clear to auscultation bilaterally. GI: Soft, nontender, non-distended  MS: No edema; No deformity.  Neuro:  Nonfocal  Psych: Normal affect   Labs    Chemistry Recent Labs  Lab 10/22/18 1050  10/23/18 0206 10/24/18 0207 10/25/18 0430  NA 141  --  141 139 142  K 4.4  --  4.3 3.9 4.1  CL 108  --  111 108 110  CO2 17*  --  18* 22 25  GLUCOSE 162*  --  81 99 108*  BUN 13  --  26* 23 18  CREATININE 1.24   < > 1.93* 1.31* 1.17  CALCIUM 10.3  --  9.3 9.0 9.6  PROT 7.1  --   --  5.3* 5.7*  ALBUMIN 3.7  --   --  2.6* 2.8*  AST 43*  --   --  34 25  ALT 25  --   --  31 29  ALKPHOS 66  --   --  43 53  BILITOT 1.8*  --   --  1.0 1.0  GFRNONAA 55*   < > 32* 52* 59*  GFRAA >60   < > 38* >60 >60  ANIONGAP 16*  --  12 9 7    < > = values in this interval not displayed.     Hematology  Recent Labs  Lab 10/23/18 0206 10/24/18 0207 10/25/18 0430  WBC 22.1* 24.1* 19.8*  RBC 4.99 4.55 4.88  HGB 15.9 14.4 15.2  HCT 46.0 41.7 44.1  MCV 92.2 91.6 90.4  MCH 31.9 31.6 31.1  MCHC 34.6 34.5 34.5  RDW 18.6* 18.6* 18.4*  PLT 177 158 161    Cardiac Enzymes Recent Labs  Lab 10/22/18 1050 10/22/18 2015 10/23/18 0206 10/23/18 0730  TROPONINI 0.07* 0.26* 0.27* 0.20*   No results for input(s): TROPIPOC in the last 168 hours.   BNP Recent Labs  Lab 10/22/18 1051  BNP 60.5     DDimer No results for input(s): DDIMER in the last 168 hours.   Radiology      Cardiac Studies   10/23/2018: TTE IMPRESSIONS 1. The left ventricle has low normal systolic function, with an ejection fraction of 50-55%. The cavity size was normal. There is moderate concentric left ventricular hypertrophy. Left ventricular diastolic Doppler parameters are consistent with  pseudonormalization. Indeterminate filling pressures. 2. The right ventricle has normal systolic function. The cavity was normal. There is no increase in right ventricular wall thickness. Right ventricular systolic pressure could not be assessed. 3. Left atrial size was severely dilated. 4. The aortic valve has an indeterminate number  of cusps. Moderate thickening of the aortic valve. Moderate calcification of the aortic valve. Aortic valve regurgitation is mild by color flow Doppler. Mild stenosis of the aortic valve. 5. The inferior vena cava was dilated in size with <50% respiratory variability.  FINDINGS Left Ventricle: The left ventricle has low normal systolic function, with an ejection fraction of 50-55%. The cavity size was normal. There is moderate concentric left ventricular hypertrophy. Left ventricular diastolic Doppler parameters are consistent with pseudonormalization. Indeterminate filling pressures  Right Ventricle: The right ventricle has normal systolic function. The cavity was normal. There is no increase in right ventricular wall thickness. Right ventricular systolic pressure could not be assessed.  Left Atrium: Left atrial size was severely dilated.  Right Atrium: Right atrial size was normal in size. Right atrial pressure is estimated at 10 mmHg.  Interatrial Septum: No atrial level shunt detected by color flow Doppler.  Pericardium: There is no evidence of pericardial effusion.  Mitral Valve: The mitral valve is normal in structure. Mitral valve regurgitation is mild by color flow Doppler.  Tricuspid Valve: The tricuspid valve is normal in structure. Tricuspid valve regurgitation is trivial by color flow Doppler.  Aortic Valve: The aortic valve has an indeterminate number of cusps Moderate thickening of the aortic valve, with moderately decreased cusp excursion. Moderate calcification of the aortic valve. Aortic valve regurgitation is mild by color flow Doppler.  There is Mild stenosis of the aortic valve.  Pulmonic Valve: The pulmonic valve was normal in structure. Pulmonic valve regurgitation was not assessed by color flow Doppler.  Venous: The inferior vena cava is dilated in size with less than 50% respiratory variability.    Patient Profile     78 y.o. male with a hx of  CAD (number of stents over the years, most recent PCI Jan 2020) , HTN, HLD, hypothyroidism, and sinus node dysfunction w/PPM admitted with sepsis found to have strep bacteremia  He is feeling better since being on antibiotics He remains SOB, perhaps better  Device information MDT dual chamber PPM implanted 06/22/2018  Assessment & Plan    1. Sepsis     Strep bacteremia, Streptococcus species     ID on case  We plan to place temp-perm  pacing system s/p device removal this afternoon, Camielle Sizer need guidance from ID on time to re-implant a new permanent system  2. Presumed device/pocket infection     TTE made no mention of vegetations      The pocket looks OK, there is a small area (72mm) of a dark spot middle of his healed incision.        No tethering      No fluctuation      No pain  He has generalized erythema throughout his torso, and arms, he reports ast weekend sufferring a significant sunburn      3. PPM     Implant indication was syncope/sinus node dysfunction     57% AP     6% VP      TEE today, if no vegetation or no large vegetation, plan for PPM removal this afternoon Dr. Curt Bears has seen and examined the patient, discussed rational for system removal and plans for temp-perm pacer insertion.  Discussed potential benefits and risks of planned procedures, and particularly bleeding given Brilinta/plavix The patient is agreeable   4. SOB     Exam does not suggest fluid OL     Pt reports this as chronic, dating back to Jan, why brilinta is suspect as cause     Today he suggests perhaps even longer then that     Changed from [place] to Plavix yesterday by cards        For questions or updates, please contact [facility] Please consult [email or web address] for contact info under        Signed, Baldwin [place], PA-C  10/25/2018, 7:37 AM    I have seen and examined this patient with Tommye Standard.  Agree with above, note added to reflect my findings.  On exam, RRR, no  murmurs, lungs clear. Patient with pacemaker for SSS and syncope. Pacemaker appears infected. Essa Malachi thus plan for device extraction. Nikita Surman reimplant temp/perm device as he has had syncope. Haniyyah Sakuma get a TEE this AM to rule out endocarditis.  JUANANTONIO STOLAR has presented today for surgery, with the diagnosis of pacemaker infection.  The various methods of treatment have been discussed with the patient and family. After consideration of risks, benefits and other options for treatment, the patient has consented to  Procedure(s): Pacemaker and lead extraction as a surgical intervention .  Risks include but not limited to bleeding, tamponade, infection, among others. The patient's history has been reviewed, patient examined, no change in status, stable for surgery.  I have reviewed the patient's chart and labs.  Questions were answered to the patient's satisfaction.      Grady Mohabir M. Regene Mccarthy MD 10/25/2018 7:50 AM

## 2018-10-25 NOTE — Transfer of Care (Signed)
Immediate Anesthesia Transfer of Care Note  Patient: [name] Glass  Procedure(s) Performed: TRANSESOPHAGEAL ECHOCARDIOGRAM (TEE) (N/A )  Patient Location: Endoscopy Unit  Anesthesia Type:MAC  Level of Consciousness: awake, alert  and oriented  Airway & Oxygen Therapy: Patient Spontanous Breathing and Patient connected to nasal cannula oxygen  Post-op Assessment: Report given to RN, Post -op Vital signs reviewed and stable and Patient moving all extremities X 4  Post vital signs: Reviewed and stable  Last Vitals:  Vitals Value Taken Time  BP    Temp    Pulse    Resp    SpO2      Last Pain:  Vitals:   10/25/18 0820  TempSrc: Oral  PainSc: 0-No pain      Patients Stated Pain Goal: 0 (37/16/96 7893)  Complications: No apparent anesthesia complications

## 2018-10-25 NOTE — Progress Notes (Signed)
  Echocardiogram Echocardiogram Transesophageal has been performed.  [name] Glass 10/25/2018, 9:57 AM

## 2018-10-25 NOTE — CV Procedure (Signed)
    Transesophageal Echocardiogram Note  [name] Glass [id number] 02/23/1941  Procedure: Transesophageal Echocardiogram Indications: bacteremia  Procedure Details Consent: Obtained Time Out: Verified patient identification, verified procedure, site/side was marked, verified correct patient position, special equipment/implants available, Radiology Safety Procedures followed,  medications/allergies/relevent history reviewed, required imaging and test results available.  Performed  Medications:  During this procedure the patient is administered a propofol drip by CRNA .   Total of 145 mg propofol was given   Left Ventrical:  Mild LV dysfunction   Right Ventrical:   Pacing wire visualized.  No vegetation.  Mitral Valve: mild - mod MR,  No vegetations   Aortic Valve: calcified non coronary cusp with limited mobility .  No vegetation .   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     Complications: No apparent complications Patient did tolerate procedure well.   Thayer Headings, Brooke Bonito., MD, [facility] 10/25/2018, 9:53 AM

## 2018-10-26 ENCOUNTER — Inpatient Hospital Stay: Payer: Self-pay

## 2018-10-26 ENCOUNTER — Inpatient Hospital Stay (HOSPITAL_COMMUNITY): Payer: Medicare Other

## 2018-10-26 LAB — CBC
HCT: 46.8 % (ref 39.0–52.0)
Hemoglobin: 16 g/dL (ref 13.0–17.0)
MCH: 31.3 pg (ref 26.0–34.0)
MCHC: 34.2 g/dL (ref 30.0–36.0)
MCV: 91.6 fL (ref 80.0–100.0)
Platelets: 177 10*3/uL (ref 150–400)
RBC: 5.11 MIL/uL (ref 4.22–5.81)
RDW: 18.6 % — ABNORMAL HIGH (ref 11.5–15.5)
WBC: 9.6 10*3/uL (ref 4.0–10.5)
nRBC: 0.4 % — ABNORMAL HIGH (ref 0.0–0.2)

## 2018-10-26 NOTE — Progress Notes (Signed)
Progress Note   Subjective   Doing well today, the patient denies CP or SOB.  No new concerns  Inpatient Medications    Scheduled Meds: . anastrozole  1 mg Oral Once per day on Mon Thu  . aspirin EC  81 mg Oral Daily  . ezetimibe  10 mg Oral Daily  . fluticasone  2 spray Each Nare Daily  . ipratropium-albuterol  3 mL Nebulization BID  . pantoprazole  40 mg Oral Daily  . pravastatin  40 mg Oral q1800  . sodium chloride flush  3 mL Intravenous Q12H  . tamsulosin  0.4 mg Oral QPC breakfast  . thyroid  120 mg Oral QAC breakfast  . thyroid  60 mg Oral QPM   Continuous Infusions: . cefTRIAXone (ROCEPHIN)  IV 2 g (10/25/18 1700)   PRN Meds: acetaminophen, albuterol, HYDROmorphone (DILAUDID) injection, ondansetron **OR** ondansetron (ZOFRAN) IV, oxyCODONE   Vital Signs    Vitals:   10/25/18 2034 10/26/18 0006 10/26/18 0340 10/26/18 0915  BP: 117/78 (!) 125/58    Pulse: (!) 50 61    Resp: 20 18    Temp: 98 F (36.7 C) 98.7 F (37.1 C)    TempSrc: Oral     SpO2: 95% 97%  95%  Weight:   95.3 kg   Height:        Intake/Output Summary (Last 24 hours) at 10/26/2018 1230 Last data filed at 10/26/2018 0900 Gross per 24 hour  Intake 942 ml  Output 3900 ml  Net -2958 ml   Filed Weights   10/25/18 0530 10/25/18 0820 10/26/18 0340  Weight: 96.3 kg 96.3 kg 95.3 kg    Telemetry    Sinus with intermittent V pacing (VVI 50 bpm) - Personally Reviewed  Physical Exam   GEN- The patient is well appearing, alert and oriented x 3 today.   Head- normocephalic, atraumatic Eyes-  Sclera clear, conjunctiva pink Ears- hearing intact Oropharynx- clear Neck- supple, Lungs-   normal work of breathing (paced) Heart- Regular rate and rhythm  Skin- pacemaker site is without hematoma   Labs    Chemistry Recent Labs  Lab 10/22/18 1050  10/23/18 0206 10/24/18 0207 10/25/18 0430  NA 141  --  141 139 142  K 4.4  --  4.3 3.9 4.1  CL 108  --  111 108 110  CO2 17*  --  18* 22 25   GLUCOSE 162*  --  81 99 108*  BUN 13  --  26* 23 18  CREATININE 1.24   < > 1.93* 1.31* 1.17  CALCIUM 10.3  --  9.3 9.0 9.6  PROT 7.1  --   --  5.3* 5.7*  ALBUMIN 3.7  --   --  2.6* 2.8*  AST 43*  --   --  34 25  ALT 25  --   --  31 29  ALKPHOS 66  --   --  43 53  BILITOT 1.8*  --   --  1.0 1.0  GFRNONAA 55*   < > 32* 52* 59*  GFRAA >60   < > 38* >60 >60  ANIONGAP 16*  --  12 9 7    < > = values in this interval not displayed.     Hematology Recent Labs  Lab 10/24/18 0207 10/25/18 0430 10/26/18 0707  WBC 24.1* 19.8* 9.6  RBC 4.55 4.88 5.11  HGB 14.4 15.2 16.0  HCT 41.7 44.1 46.8  MCV 91.6 90.4 91.6  MCH 31.6 31.1  31.3  MCHC 34.5 34.5 34.2  RDW 18.6* 18.4* 18.6*  PLT 158 161 177    Cardiac Enzymes Recent Labs  Lab 10/22/18 1050 10/22/18 2015 10/23/18 0206 10/23/18 0730  TROPONINI 0.07* 0.26* 0.27* 0.20*   No results for input(s): TROPIPOC in the last 168 hours.      Assessment & Plan    1.  Strep bacteremia/ pacemaker system infection Doing very well s/p pacing system removal He does intermittently V pace from his VVI temp/per pacing system Continue IV antibiotics as per ID Could go home if IV antibiotics can be arranged over the weekend. If he gets a PICC line, this would need to be paced on the left side, leaving the right side available for new pacemaker implant.  We will defer timing of new pacing system at this time.  We will need to wait until his current incision appears to be healing and blood cultures are adequately negative.  He could potentially be discharged to return for reimplantation electively once outpatient IV antibiotics are arranged.  EP to see again on Monday if still here.  Thompson Grayer MD, [facility] 10/26/2018 12:30 PM

## 2018-10-26 NOTE — Progress Notes (Signed)
PROGRESS NOTE  [name] Glass GEX:[id number] DOB: 05-May-1941 DOA: 10/22/2018 PCP: [name], MD  HPI/Recap of past 24 hours: [name] Glass a 78 y.o.malewith history h/ohyperlipidemia, hypertension, CAD status post PTCA x 6, with last stent in January, GERD, arthritis, hiatal hernia, sleep apnea presents to the ED today with complaints of productive cough and dyspnea. Patient states he has had on and off symptoms of bronchitis and received at least 2 courses of antibiotics within the last 3 months. On presentation to the ED he was noted to be febrile, tachycardic, dyspneic and hypoxic requiring 2 L of nasal cannula.Labs revealed elevated lactate at 4, sepsis protocol was initiated. Patient received IV fluids broad-spectrum antibiotics and underwent COVID-19 work-up as well as CT chest to rule out PE/pneumonia which were all unremarkable. Patient lives with his wife and denies any sick contacts. Skin redness noted along his trunk and face which he relates to sunburn.He denies any chest pain or leg swellings or headache or dizziness. He does report low back pain and points to right iliac/paraspinal area. He does have a history of kidney stones but does not feel this pain is like prior episodes and denies any dysuria/hematuria. He drinks scotch daily but denies any history of alcohol withdrawal.  Patient does have long history of CAD and had 5 stents placed more than 10 years back by Dr [name]. He reports undergoing a stress test in [place] at one point which was apparently good.He was in [facility] and had an episode of syncope.He ultimately was transported by EMS to [facility] where he underwent cardiac catheterizationfor NSTEMI revealinghigh-grade calcified LAD stenosis requiring DESimplantation in Jan 2020.He has remained on aspirin and Brilinta since then.He also was noted to have long pauses on telemetry with recurrent  syncopeduring that hospitalizationand andunderwentpermanent pacemaker implantation.When seen by Dr. Alvester Chou for follow-up on January 28, patient still had pocket staples in. He currently has tenderness at pacemaker site as well as a dry whitish scab (which was also noted at follow-up visit with cardiologist in March). Patient denies noticing any discharge.He did receive a course of abx (doxycycline) in January for cellulitis (apparently hadrt forearm cellulitis, rtknee injury from fall at [facility]) and Omnicef in March (for pharyngitis/bronchitis)  10/26/18: Patient seen and examined at his bedside.  Reports his dyspnea is improving but persists with ambulation.  PPM removed yesterday afternoon 10/25/2018.  Denies chest pain.    Assessment/Plan: Principal Problem:   Streptococcal Gallolyticus Bacteremia Active Problems:   Coronary artery disease   OA (osteoarthritis) of knee   Gastroesophageal reflux disease without esophagitis   Essential hypertension, benign   Hyperlipidemia with target LDL less than 70   Chronic renal disease, stage 2, mildly decreased glomerular filtration rate (GFR) between 60-89 mL/min/1.73 square meter   Sepsis ([place])   H/O splenectomy   Presence of permanent cardiac pacemaker   Sleep apnea   Acute low back pain   Chronic bronchitis (HCC)  Sepsis secondary to strep Gallolyticus bacteremia Presented with fever with T-max of 102.5, tachycardia with heart rate 133, tachypnea with respiration rate 32 Blood cultures drawn on 10/22/2018+ for strep G Repeat blood culture done on 10/23/2018- to date Patient with history of splenectomy 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  Will need to follow-up outpatient with GI.   Self-reported last colonoscopy was 3 years ago with GI [place] Will need to follow-up with GI [place] outpatient at discharge Post TEE on  10/25/2018 no valvular vegetation or ICD vegetation.  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 Continue penicillin G Will need PICC line for long-term IV antibiotics Infectious disease following Blood cultures x2 repeated on 10/26/2018 After removal of PPM wait 72 hours before placement of new device. Vital signs and labs reviewed: Afebrile with no leukocytosis  Acute hypoxic respiratory failure Work-up unrevealing CTA PE negative Chest x-ray unremarkable Brilinta has been discontinued due to her shortness of breath, started on Plavix Wean oxygen supplement as tolerated Home O2 eval prior to discharge  Elevated troponin likely demand ischemia Troponin peaked at 0.27 and trended down Ectopic atrial tachycardia on twelve-lead EKG with QTC 537 Cardiology following  Prolonged QTC Presented with QTC 537 Magnesium normal Repeat twelve-lead EKG  Coronary artery disease status post PCI with stenting Brilinta discontinued due to shortness of breath and hypoxia On aspirin and Plavix Continue [facility]et[facility] Cardiology following  PFO, incidentally found on TEE done on 10/25/2018  History of splenectomy Molecular stated above Blood culture will be repeated this afternoon on 10/25/2018  History of sick sinus syndrome Status post pacemaker  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  Strep bacteremia. Requires continued ID eval, TEE, IV abx.   Consultants:   ID  Cardiology  Procedures: Echo    Objective: Vitals:   10/25/18 2034 10/26/18 0006 10/26/18 0340 10/26/18 0915  BP: 117/78 (!) 125/58    Pulse: (!) 50 61    Resp: 20 18      Temp: 98 F (36.7 C) 98.7 F (37.1 C)    TempSrc: Oral     SpO2: 95% 97%  95%  Weight:   95.3 kg   Height:        Intake/Output Summary (Last 24 hours) at 10/26/2018 1216 Last data filed at 10/26/2018 0900 Gross per 24 hour  Intake 942 ml  Output 3900 ml  Net -2958 ml   Filed Weights   10/25/18 0530 10/25/18 0820 10/26/18 0340  Weight: 96.3 kg 96.3 kg 95.3 kg    Exam:   General: 78 y.o. year-old male well-developed well-nourished in no acute distress.  Alert and oriented x3.    Cardiovascular: Regular rate and rhythm with no rubs or gallops.  No JVD or thyromegaly noted.  Respiratory: Mild rales at bases with no wheezes noted.  Good respiratory effort.  Abdomen: Obese and nontender on palpation.  Normal bowel sounds x4 quadrants.  Musculoskeletal: Trace lower extremity edema.  2 out of 4 pulses in all 4 extremities.   Psychiatry: Mood is appropriate for condition and setting.   Data Reviewed: CBC: Recent Labs  Lab 10/22/18 1050 10/22/18 2304 10/23/18 0206 10/24/18 0207 10/25/18 0430 10/26/18 0707  WBC 5.0 18.5* 22.1* 24.1* 19.8* 9.6  NEUTROABS 3.7  --   --   --   --   --   HGB 18.2* 16.8 15.9 14.4 15.2 16.0  HCT 54.3* 49.1 46.0 41.7 44.1 46.8  MCV 93.1 91.9 92.2 91.6 90.4 91.6  PLT 261 199 177 158 161 867   Basic Metabolic Panel: Recent Labs  Lab 10/22/18 1050 10/22/18 2015 10/23/18 0206 10/24/18 0207 10/25/18  0430  NA 141  --  141 139 142  K 4.4  --  4.3 3.9 4.1  CL 108  --  111 108 110  CO2 17*  --  18* 22 25  GLUCOSE 162*  --  81 99 108*  BUN 13  --  26* 23 18  CREATININE 1.24 1.97* 1.93* 1.31* 1.17  CALCIUM 10.3  --  9.3 9.0 9.6  MG  --   --   --  1.4* 1.9   GFR: Estimated Creatinine Clearance: 62.3 mL/min (by C-G formula based on SCr of 1.17 mg/dL). Liver Function Tests: Recent Labs  Lab 10/22/18 1050 10/24/18 0207 10/25/18 0430  AST 43* 34 25  ALT 25 31 29   ALKPHOS 66 43 53  BILITOT 1.8* 1.0 1.0  PROT 7.1 5.3* 5.7*  ALBUMIN  3.7 2.6* 2.8*   No results for input(s): LIPASE, AMYLASE in the last 168 hours. No results for input(s): AMMONIA in the last 168 hours. Coagulation Profile: No results for input(s): INR, PROTIME in the last 168 hours. Cardiac Enzymes: Recent Labs  Lab 10/22/18 1050 10/22/18 2015 10/23/18 0206 10/23/18 0730  TROPONINI 0.07* 0.26* 0.27* 0.20*   BNP (last 3 results) No results for input(s): PROBNP in the last 8760 hours. HbA1C: No results for input(s): HGBA1C in the last 72 hours. CBG: No results for input(s): GLUCAP in the last 168 hours. Lipid Profile: No results for input(s): CHOL, HDL, LDLCALC, TRIG, CHOLHDL, LDLDIRECT in the last 72 hours. Thyroid Function Tests: No results for input(s): TSH, T4TOTAL, FREET4, T3FREE, THYROIDAB in the last 72 hours. Anemia Panel: No results for input(s): VITAMINB12, FOLATE, FERRITIN, TIBC, IRON, RETICCTPCT in the last 72 hours. Urine analysis:    Component Value Date/Time   COLORURINE YELLOW 10/22/2018 1122   APPEARANCEUR HAZY (A) 10/22/2018 1122   LABSPEC 1.010 10/22/2018 1122   PHURINE 5.0 10/22/2018 1122   GLUCOSEU NEGATIVE 10/22/2018 1122   GLUCOSEU NEGATIVE 06/22/2017 1116   HGBUR SMALL (A) 10/22/2018 1122   BILIRUBINUR NEGATIVE 10/22/2018 1122   KETONESUR NEGATIVE 10/22/2018 1122   PROTEINUR 100 (A) 10/22/2018 1122   UROBILINOGEN 0.2 06/22/2017 1116   NITRITE NEGATIVE 10/22/2018 1122   LEUKOCYTESUR NEGATIVE 10/22/2018 1122   Sepsis Labs: @LABRCNTIP (procalcitonin:4,lacticidven:4)  ) Recent Results (from the past 240 hour(s))  SARS Coronavirus 2 (CEPHEID- Performed in [facility] hospital lab), Hosp Order     Status: None   Collection Time: 10/22/18 10:47 AM  Result Value Ref Range Status   SARS Coronavirus 2 NEGATIVE NEGATIVE Final    Comment: (NOTE) If result is NEGATIVE SARS-CoV-2 target nucleic acids are NOT DETECTED. The SARS-CoV-2 RNA is generally detectable in upper and lower  respiratory specimens during the acute  phase of infection. The lowest  concentration of SARS-CoV-2 viral copies this assay can detect is 250  copies / mL. A negative result does not preclude SARS-CoV-2 infection  and should not be used as the sole basis for treatment or other  patient management decisions.  A negative result may occur with  improper specimen collection / handling, submission of specimen other  than nasopharyngeal swab, presence of viral mutation(s) within the  areas targeted by this assay, and inadequate number of viral copies  (<250 copies / mL). A negative result must be combined with clinical  observations, patient history, and epidemiological information. If result is POSITIVE SARS-CoV-2 target nucleic acids are DETECTED. The SARS-CoV-2 RNA is generally detectable in upper and lower  respiratory specimens dur ing the acute phase  of infection.  Positive  results are indicative of active infection with SARS-CoV-2.  Clinical  correlation with patient history and other diagnostic information is  necessary to determine patient infection status.  Positive results do  not rule out bacterial infection or co-infection with other viruses. If result is PRESUMPTIVE POSTIVE SARS-CoV-2 nucleic acids MAY BE PRESENT.   A presumptive positive result was obtained on the submitted specimen  and confirmed on repeat testing.  While 2019 novel coronavirus  (SARS-CoV-2) nucleic acids may be present in the submitted sample  additional confirmatory testing may be necessary for epidemiological  and / or clinical management purposes  to differentiate between  SARS-CoV-2 and other Sarbecovirus currently known to infect humans.  If clinically indicated additional testing with an alternate test  methodology [phone]) is advised. The SARS-CoV-2 RNA is generally  detectable in upper and lower respiratory sp ecimens during the acute  phase of infection. The expected result is Negative. Fact Sheet for Patients:   StrictlyIdeas.no Fact Sheet for Healthcare Providers: BankingDealers.co.za This test is not yet approved or cleared by the [place] FDA and has been authorized for detection and/or diagnosis of SARS-CoV-2 by FDA under an Emergency Use Authorization (EUA).  This EUA will remain in effect (meaning this test can be used) for the duration of the COVID-19 declaration under Section 564(b)(1) of the Act, 21 U.S.C. section 360bbb-3(b)(1), unless the authorization is terminated or revoked sooner. Performed at [facility], [place] [address] [place], [place] [address]   Blood culture (routine x 2)     Status: Abnormal   Collection Time: 10/22/18 10:52 AM  Result Value Ref Range Status   Specimen Description BLOOD BLOOD LEFT FOREARM  Final   Special Requests   Final    BOTTLES DRAWN AEROBIC AND ANAEROBIC Blood Culture adequate volume   Culture  Setup Time   Final    IN BOTH AEROBIC AND ANAEROBIC BOTTLES GRAM POSITIVE COCCI CRITICAL RESULT CALLED TO, READ BACK BY AND VERIFIED WITH: J ORIET PHARMD 10/22/18 2300 JDW    Culture (A)  Final    STREPTOCOCCUS GALLOLYTICUS SUSCEPTIBILITIES PERFORMED ON PREVIOUS CULTURE WITHIN THE LAST 5 DAYS. Performed at [facility], [place] [address], [place], [place] [address]    Report Status 10/24/2018 FINAL  Final  Blood Culture ID Panel (Reflexed)     Status: Abnormal   Collection Time: 10/22/18 10:52 AM  Result Value Ref Range Status   Enterococcus species NOT DETECTED NOT DETECTED Final   Listeria monocytogenes NOT DETECTED NOT DETECTED Final   Staphylococcus species NOT DETECTED NOT DETECTED Final   Staphylococcus aureus (BCID) NOT DETECTED NOT DETECTED Final   Streptococcus species DETECTED (A) NOT DETECTED Final    Comment: Not Enterococcus species, Streptococcus agalactiae, Streptococcus pyogenes, or Streptococcus pneumoniae. CRITICAL RESULT CALLED TO, READ BACK BY AND VERIFIED WITH: J ORIET  PHARMD 10/22/18 2300 JDW    Streptococcus agalactiae NOT DETECTED NOT DETECTED Final   Streptococcus pneumoniae NOT DETECTED NOT DETECTED Final   Streptococcus pyogenes NOT DETECTED NOT DETECTED Final   Acinetobacter baumannii NOT DETECTED NOT DETECTED Final   Enterobacteriaceae species NOT DETECTED NOT DETECTED Final   Enterobacter cloacae complex NOT DETECTED NOT DETECTED Final   Escherichia coli NOT DETECTED NOT DETECTED Final   Klebsiella oxytoca NOT DETECTED NOT DETECTED Final   Klebsiella pneumoniae NOT DETECTED NOT DETECTED Final   Proteus species NOT DETECTED NOT DETECTED Final   Serratia marcescens NOT DETECTED NOT DETECTED Final   Haemophilus  influenzae NOT DETECTED NOT DETECTED Final   Neisseria meningitidis NOT DETECTED NOT DETECTED Final   Pseudomonas aeruginosa NOT DETECTED NOT DETECTED Final   Candida albicans NOT DETECTED NOT DETECTED Final   Candida glabrata NOT DETECTED NOT DETECTED Final   Candida krusei NOT DETECTED NOT DETECTED Final   Candida parapsilosis NOT DETECTED NOT DETECTED Final   Candida tropicalis NOT DETECTED NOT DETECTED Final    Comment: Performed at [facility], [place] [address], [place], [place] [address]  Urine culture     Status: Abnormal   Collection Time: 10/22/18 11:22 AM  Result Value Ref Range Status   Specimen Description URINE, RANDOM  Final   Special Requests NONE  Final   Culture (A)  Final    <10,000 COLONIES/mL INSIGNIFICANT GROWTH Performed at [place] [address] [place], [place] [address]    Report Status 10/23/2018 FINAL  Final  Blood culture (routine x 2)     Status: Abnormal   Collection Time: 10/22/18 11:23 AM  Result Value Ref Range Status   Specimen Description BLOOD RIGHT ANTECUBITAL  Final   Special Requests   Final    BOTTLES DRAWN AEROBIC AND ANAEROBIC Blood Culture adequate volume   Culture  Setup Time   Final    IN BOTH AEROBIC AND ANAEROBIC BOTTLES GRAM POSITIVE COCCI CRITICAL VALUE NOTED.   VALUE IS CONSISTENT WITH PREVIOUSLY REPORTED AND CALLED VALUE. Performed at [facility], [place] [address], [place], [place] [address]    Culture STREPTOCOCCUS GALLOLYTICUS (A)  Final   Report Status 10/24/2018 FINAL  Final   Organism ID, Bacteria STREPTOCOCCUS GALLOLYTICUS  Final      Susceptibility   Streptococcus gallolyticus - MIC*    PENICILLIN 0.12 SENSITIVE Sensitive     CEFTRIAXONE <=0.12 SENSITIVE Sensitive     ERYTHROMYCIN <=0.12 SENSITIVE Sensitive     LEVOFLOXACIN 2 SENSITIVE Sensitive     VANCOMYCIN <=0.12 SENSITIVE Sensitive     * STREPTOCOCCUS GALLOLYTICUS  Respiratory Panel by PCR     Status: None   Collection Time: 10/22/18  5:44 PM  Result Value Ref Range Status   Adenovirus NOT DETECTED NOT DETECTED Final   Coronavirus 229E NOT DETECTED NOT DETECTED Final    Comment: (NOTE) The Coronavirus on the Respiratory Panel, DOES NOT test for the novel  Coronavirus (2019 nCoV)    Coronavirus HKU1 NOT DETECTED NOT DETECTED Final   Coronavirus NL63 NOT DETECTED NOT DETECTED Final   Coronavirus OC43 NOT DETECTED NOT DETECTED Final   Metapneumovirus NOT DETECTED NOT DETECTED Final   Rhinovirus / Enterovirus NOT DETECTED NOT DETECTED Final   Influenza A NOT DETECTED NOT DETECTED Final   Influenza B NOT DETECTED NOT DETECTED Final   Parainfluenza Virus 1 NOT DETECTED NOT DETECTED Final   Parainfluenza Virus 2 NOT DETECTED NOT DETECTED Final   Parainfluenza Virus 3 NOT DETECTED NOT DETECTED Final   Parainfluenza Virus 4 NOT DETECTED NOT DETECTED Final   Respiratory Syncytial Virus NOT DETECTED NOT DETECTED Final   Bordetella pertussis NOT DETECTED NOT DETECTED Final   Chlamydophila pneumoniae NOT DETECTED NOT DETECTED Final   Mycoplasma pneumoniae NOT DETECTED NOT DETECTED Final    Comment: Performed at [facility], [place]. [address], [place], [place] [address]  SARS Coronavirus 2 (CEPHEID- Performed in [facility] hospital lab), Hosp Order     Status: None    Collection Time: 10/22/18  5:44 PM  Result Value Ref Range Status  SARS Coronavirus 2 NEGATIVE NEGATIVE Final    Comment: (NOTE) If result is NEGATIVE SARS-CoV-2 target nucleic acids are NOT DETECTED. The SARS-CoV-2 RNA is generally detectable in upper and lower  respiratory specimens during the acute phase of infection. The lowest  concentration of SARS-CoV-2 viral copies this assay can detect is 250  copies / mL. A negative result does not preclude SARS-CoV-2 infection  and should not be used as the sole basis for treatment or other  patient management decisions.  A negative result may occur with  improper specimen collection / handling, submission of specimen other  than nasopharyngeal swab, presence of viral mutation(s) within the  areas targeted by this assay, and inadequate number of viral copies  (<250 copies / mL). A negative result must be combined with clinical  observations, patient history, and epidemiological information. If result is POSITIVE SARS-CoV-2 target nucleic acids are DETECTED. The SARS-CoV-2 RNA is generally detectable in upper and lower  respiratory specimens dur ing the acute phase of infection.  Positive  results are indicative of active infection with SARS-CoV-2.  Clinical  correlation with patient history and other diagnostic information is  necessary to determine patient infection status.  Positive results do  not rule out bacterial infection or co-infection with other viruses. If result is PRESUMPTIVE POSTIVE SARS-CoV-2 nucleic acids MAY BE PRESENT.   A presumptive positive result was obtained on the submitted specimen  and confirmed on repeat testing.  While 2019 novel coronavirus  (SARS-CoV-2) nucleic acids may be present in the submitted sample  additional confirmatory testing may be necessary for epidemiological  and / or clinical management purposes  to differentiate between  SARS-CoV-2 and other Sarbecovirus currently known to infect humans.    If clinically indicated additional testing with an alternate test  methodology [phone]) is advised. The SARS-CoV-2 RNA is generally  detectable in upper and lower respiratory sp ecimens during the acute  phase of infection. The expected result is Negative. Fact Sheet for Patients:  StrictlyIdeas.no Fact Sheet for Healthcare Providers: BankingDealers.co.za This test is not yet approved or cleared by the [place] FDA and has been authorized for detection and/or diagnosis of SARS-CoV-2 by FDA under an Emergency Use Authorization (EUA).  This EUA will remain in effect (meaning this test can be used) for the duration of the COVID-19 declaration under Section 564(b)(1) of the Act, 21 U.S.C. section 360bbb-3(b)(1), unless the authorization is terminated or revoked sooner. Performed at [facility], [place] [address], [place], [place] [address]   Urine culture     Status: None   Collection Time: 10/22/18  8:29 PM  Result Value Ref Range Status   Specimen Description URINE, RANDOM  Final   Special Requests NONE  Final   Culture   Final    NO GROWTH Performed at [facility], [place] [address], [place], [place] [address]    Report Status 10/24/2018 FINAL  Final  Culture, blood (routine x 2)     Status: None (Preliminary result)   Collection Time: 10/23/18  7:45 AM  Result Value Ref Range Status   Specimen Description BLOOD LEFT ANTECUBITAL  Final   Special Requests   Final    BOTTLES DRAWN AEROBIC ONLY Blood Culture adequate volume   Culture   Final    NO GROWTH 2 DAYS Performed at [facility], [place] [address] [place],  [address]    Report Status PENDING  Incomplete  Culture, blood (routine x 2)  Status: None (Preliminary result)   Collection Time: 10/23/18  7:53 AM  Result Value Ref Range Status   Specimen Description BLOOD RIGHT HAND  Final   Special Requests   Final    BOTTLES DRAWN AEROBIC ONLY Blood  Culture adequate volume   Culture   Final    NO GROWTH 2 DAYS Performed at [facility], [address] [place], [place] [address]    Report Status PENDING  Incomplete      Studies: Dg Chest 2 View  Result Date: 10/26/2018 CLINICAL DATA:  78 year old male with cardiac device in C2. EXAM: CHEST - 2 VIEW COMPARISON:  Chest x-ray 10/24/2018. FINDINGS: Lung volumes are slightly low. Bibasilar areas of scarring, similar to prior examination. No acute consolidative airspace disease. No pleural effusions. No evidence of pulmonary edema. Heart size appears mildly enlarged. Upper mediastinal contours are within normal limits. Aortic atherosclerosis. Left-sided pacemaker device with lead tip projecting over the expected location of the right ventricular apex. Previously noted right atrial lead has been removed. IMPRESSION: 1. Interval revision of left pacemaker, as above. No radiographic evidence of acute cardiopulmonary disease. Electronically Signed   By: Vinnie Langton M.D.   On: 10/26/2018 08:03    Scheduled Meds:  anastrozole  1 mg Oral Once per day on Mon Thu   aspirin EC  81 mg Oral Daily   ezetimibe  10 mg Oral Daily   fluticasone  2 spray Each Nare Daily   ipratropium-albuterol  3 mL Nebulization BID   pantoprazole  40 mg Oral Daily   pravastatin  40 mg Oral q1800   sodium chloride flush  3 mL Intravenous Q12H   tamsulosin  0.4 mg Oral QPC breakfast   thyroid  120 mg Oral QAC breakfast   thyroid  60 mg Oral QPM    Continuous Infusions:  cefTRIAXone (ROCEPHIN)  IV 2 g (10/25/18 1700)     LOS: 4 days     Kayleen Memos, MD [facility] Pager [phone]  If 7PM-7AM, please contact night-coverage [email or web address] Password [facility] 10/26/2018, 12:16 PM

## 2018-10-26 NOTE — TOC Progression Note (Signed)
Transition of Care [facility]) - Progression Note    Patient Details  Name: [name] Glass MRN: [id number] Date of Birth: 07-08-1940  Transition of Care [facility]) CM/SW Contact  Claudie Leach, RN Phone Number: 10/26/2018, 3:46 PM  Clinical Narrative:    Pt to go home with IV antibiotics, Rocephin q24 x 4 weeks per Dr. [place] Crane.  Patient has external pacer and is not scheduled for internal pacer replacement currently.    Patient wants to d/c ASAP to home in [facility] and travel to his condo in [place] to stay Sunday-Thursday.  [name] Glass has accepted referral for patient's home health. Pam with [facility] is coordinating infusion.    Per Dr. [place] Crane, patient should wait until Monday and hopefully have internal pacer place prior to d/c if blood cultures negative.  Pt also may need home O2.  OPAT placed.    Expected Discharge Plan: [place] Barriers to Discharge: Continued Medical Work up  Expected Discharge Plan and Services Expected Discharge Plan: [place]   Discharge Planning Services: CM Consult Post Acute Care Choice: [place] arrangements for the past 2 months: Single Family Home                 DME Arranged: IV pump/equipment DME Agency: [facility]) Date DME Agency Contacted: 10/26/18 Time DME Agency Contacted: 1400 Representative spoke with at DME Agency: Carolynn Sayers, RN HH Arranged: RN [employer] Agency: [place] Date [place]: 10/26/18 Time [place]: 1430 Representative spoke with at [place]: [place] ([place]) Interventions    Readmission Risk Interventions No flowsheet data found.

## 2018-10-26 NOTE — Progress Notes (Signed)
Informed  Pt not to tun on his left side while on bed. Pt is compliant about it. Will monitor.

## 2018-10-26 NOTE — Progress Notes (Signed)
Pt refusing CPAP. Will monitor

## 2018-10-26 NOTE — Progress Notes (Signed)
PHARMACY CONSULT NOTE FOR:  OUTPATIENT  PARENTERAL ANTIBIOTIC THERAPY (OPAT)  Indication: strep G bacteremia Regimen: ceftriaxone 2g IV q24h End date: 11/19/18  IV antibiotic discharge orders are pended. To discharging provider:  please sign these orders via discharge navigator,  Select New Orders & click on the button choice - Manage This Unsigned Work.     Thank you for allowing pharmacy to be a part of this patient's care.   Arrie Senate, PharmD, BCPS Clinical Pharmacist [phone] Please check AMION for all [facility] numbers 10/26/2018

## 2018-10-27 MED ORDER — ACETAMINOPHEN 325 MG PO TABS
650.0000 mg | ORAL_TABLET | Freq: Four times a day (QID) | ORAL | Status: DC | PRN
  Administered 2018-10-27: 650 mg via ORAL
  Filled 2018-10-27: qty 2

## 2018-10-27 NOTE — Progress Notes (Signed)
Progress Note   Subjective   Doing well today, the patient denies CP or SOB.  No new concerns  Inpatient Medications    Scheduled Meds: . anastrozole  1 mg Oral Once per day on Mon Thu  . aspirin EC  81 mg Oral Daily  . ezetimibe  10 mg Oral Daily  . fluticasone  2 spray Each Nare Daily  . ipratropium-albuterol  3 mL Nebulization BID  . pantoprazole  40 mg Oral Daily  . pravastatin  40 mg Oral q1800  . sodium chloride flush  3 mL Intravenous Q12H  . tamsulosin  0.4 mg Oral QPC breakfast  . thyroid  120 mg Oral QAC breakfast  . thyroid  60 mg Oral QPM   Continuous Infusions: . cefTRIAXone (ROCEPHIN)  IV 2 g (10/26/18 1722)   PRN Meds: acetaminophen, albuterol, HYDROmorphone (DILAUDID) injection, ondansetron **OR** ondansetron (ZOFRAN) IV, oxyCODONE   Vital Signs    Vitals:   10/26/18 1353 10/26/18 2042 10/27/18 0433 10/27/18 0809  BP: 121/68 135/80 (!) 158/78   Pulse: (!) 55 (!) 54 61 66  Resp: 20 18 18 17   Temp:  98.4 F (36.9 C) 98.4 F (36.9 C)   TempSrc:  Oral Oral   SpO2:  97% 94% 92%  Weight:   93.9 kg   Height:        Intake/Output Summary (Last 24 hours) at 10/27/2018 1027 Last data filed at 10/27/2018 0900 Gross per 24 hour  Intake 480 ml  Output 2470 ml  Net -1990 ml   Filed Weights   10/25/18 0820 10/26/18 0340 10/27/18 0433  Weight: 96.3 kg 95.3 kg 93.9 kg    Telemetry    Sinus bradycardia with intermittent V pacing - Personally Reviewed  Physical Exam   GEN- The patient is well appearing, alert and oriented x 3 today.   Head- normocephalic, atraumatic Eyes-  Sclera clear, conjunctiva pink Ears- hearing intact Oropharynx- clear Neck- supple, Lungs-  normal work of breathing Heart- Regular rate and rhythm  GI- soft, NT, ND, + BS Extremities- no clubbing, cyanosis, or edema  Skin- L chest wound examined today.  Prolene sutures in place.  No drainage currently.  No warmth, tenderness.   Labs    Chemistry Recent Labs  Lab  10/22/18 1050  10/23/18 0206 10/24/18 0207 10/25/18 0430  NA 141  --  141 139 142  K 4.4  --  4.3 3.9 4.1  CL 108  --  111 108 110  CO2 17*  --  18* 22 25  GLUCOSE 162*  --  81 99 108*  BUN 13  --  26* 23 18  CREATININE 1.24   < > 1.93* 1.31* 1.17  CALCIUM 10.3  --  9.3 9.0 9.6  PROT 7.1  --   --  5.3* 5.7*  ALBUMIN 3.7  --   --  2.6* 2.8*  AST 43*  --   --  34 25  ALT 25  --   --  31 29  ALKPHOS 66  --   --  43 53  BILITOT 1.8*  --   --  1.0 1.0  GFRNONAA 55*   < > 32* 52* 59*  GFRAA >60   < > 38* >60 >60  ANIONGAP 16*  --  12 9 7    < > = values in this interval not displayed.     Hematology Recent Labs  Lab 10/24/18 0207 10/25/18 0430 10/26/18 0707  WBC 24.1* 19.8* 9.6  RBC 4.55 4.88 5.11  HGB 14.4 15.2 16.0  HCT 41.7 44.1 46.8  MCV 91.6 90.4 91.6  MCH 31.6 31.1 31.3  MCHC 34.5 34.5 34.2  RDW 18.6* 18.4* 18.6*  PLT 158 161 177    Cardiac Enzymes Recent Labs  Lab 10/22/18 1050 10/22/18 2015 10/23/18 0206 10/23/18 0730  TROPONINI 0.07* 0.26* 0.27* 0.20*   No results for input(s): TROPIPOC in the last 168 hours.      Assessment & Plan    1.  Strep bacteremia Doing well s/p pacing system removal  Receiving IV antibiotics as per ID. Pt states that he is leaning towards staying in house until pacing system is replaced.  This may be mid to late next week.  We want to continue to follow him clinically to make sure that L chest pocket is healing and ready before we proceed.  Will need left arm PICC for antibiotics.  We want to save the right side for his new device.  Thompson Grayer MD, [facility] 10/27/2018 10:27 AM

## 2018-10-27 NOTE — Progress Notes (Signed)
Spoke to primary RN this am ,pt not to go home today;for internal pacer insertion tomorrow and to d/c if blood cultures negative.

## 2018-10-27 NOTE — Progress Notes (Signed)
PROGRESS NOTE  [name] Glass JXB:[id number] DOB: 09/26/1940 DOA: 10/22/2018 PCP: [name], MD  HPI/Recap of past 24 hours: [name] a 78 y.o.malewith history h/ohyperlipidemia, hypertension, CAD status post PTCA x 6, with last stent in January, GERD, arthritis, hiatal hernia, sleep apnea presents to the ED today with complaints of productive cough and dyspnea. Patient states he has had on and off symptoms of bronchitis and received at least 2 courses of antibiotics within the last 3 months. On presentation to the ED he was noted to be febrile, tachycardic, dyspneic and hypoxic requiring 2 L of nasal cannula.Labs revealed elevated lactate at 4, sepsis protocol was initiated. Patient received IV fluids broad-spectrum antibiotics and underwent COVID-19 work-up as well as CT chest to rule out PE/pneumonia which were all unremarkable. Patient lives with his wife and denies any sick contacts. Skin redness noted along his trunk and face which he relates to sunburn.He denies any chest pain or leg swellings or headache or dizziness. He does report low back pain and points to right iliac/paraspinal area. He does have a history of kidney stones but does not feel this pain is like prior episodes and denies any dysuria/hematuria. He drinks scotch daily but denies any history of alcohol withdrawal.  Patient does have long history of CAD and had 5 stents placed more than 10 years back by Dr [name]. He reports undergoing a stress test in [place] at one point which was apparently good.He was in [facility] and had an episode of syncope.He ultimately was transported by EMS to [facility] where he underwent cardiac catheterizationfor NSTEMI revealinghigh-grade calcified LAD stenosis requiring DESimplantation in Jan 2020.He has remained on aspirin and Brilinta since then.He also was noted to have long pauses on telemetry with recurrent  syncopeduring that hospitalizationand andunderwentpermanent pacemaker implantation.When seen by Dr. Alvester Chou for follow-up on January 28, patient still had pocket staples in. He currently has tenderness at pacemaker site as well as a dry whitish scab (which was also noted at follow-up visit with cardiologist in March). Patient denies noticing any discharge.He did receive a course of abx (doxycycline) in January for cellulitis (apparently hadrt forearm cellulitis, rtknee injury from fall at [facility]) and Omnicef in March (for pharyngitis/bronchitis)  PPM removed 10/25/2018.  Repeat blood cultures x2 done on 10/26/2018 no growth x1 day.  PICC line placed 10/27/18.  10/27/18: Patient seen and examined at his bedside.  No acute events overnight.  Denies significant tenderness at his left chest pocket, the site of his recently removed PPM.  PICC line placed by IV team for long-term IV antibiotics.  He has no new concerns.    Assessment/Plan: Principal Problem:   Streptococcal Gallolyticus Bacteremia Active Problems:   Coronary artery disease   OA (osteoarthritis) of knee   Gastroesophageal reflux disease without esophagitis   Essential hypertension, benign   Hyperlipidemia with target LDL less than 70   Chronic renal disease, stage 2, mildly decreased glomerular filtration rate (GFR) between 60-89 mL/min/1.73 square meter   Sepsis ([place])   H/O splenectomy   Presence of permanent cardiac pacemaker   Sleep apnea   Acute low back pain   Chronic bronchitis (HCC)  Resolving sepsis secondary to strep Gallolyticus bacteremia status post pacing system removal on 10/25/2018 Presented with fever with T-max of 102.5, tachycardia with heart rate 133, tachypnea with respiration rate 32 Blood cultures drawn on 10/22/2018+ for Group D strep Repeat blood culture done on 10/23/2018- to date Patient with  history of splenectomy 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  Will need to follow-up outpatient with GI.   Self-reported last colonoscopy was 3 years ago with [facility] [place] Will need to follow-up with GI [place] outpatient at discharge Post TEE on 10/25/2018 no valvular vegetation or ICD vegetation.  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 Post pacing system removal on 10/25/2018 Continue penicillin G PICC line for long-term IV antibiotics Infectious disease following Blood cultures x2 repeated on 10/26/2018 negative to date After removal of PPM wait 72 hours before placement of new device. Repeat labs in the morning Electrophysiology following.  Highly appreciated.  Resolving acute hypoxic respiratory failure Work-up unrevealing CTA PE negative Chest x-ray unremarkable Brilinta has been discontinued due to her shortness of breath, started on Plavix Wean oxygen supplement as tolerated Home O2 eval prior to discharge  Elevated troponin likely demand ischemia Troponin peaked at 0.27 and trended down Ectopic atrial tachycardia on twelve-lead EKG with QTC 537 Repeat twelve-lead EKG Cardiology following  Prolonged QTC Presented with QTC 537 Magnesium normal Repeat twelve-lead EKG  Coronary artery disease status post PCI with stenting Brilinta discontinued due to shortness of breath and hypoxia On aspirin and Plavix Continue Zetia Cardiology following  PFO, incidentally found on TEE done on 10/25/2018  History of splenectomy Molecular stated above Blood culture will be repeated this afternoon on 10/25/2018  History of sick sinus syndrome Status post pacemaker  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  Strep bacteremia. Requires continued ID eval, and IV antibiotics.   Consultants:   ID  Cardiology  Electrophysiology  Procedures: Echo TEE Pacing system removal    Objective: Vitals:   10/27/18 0433 10/27/18 0809 10/27/18 1227 10/27/18 1323  BP: (!) 158/78   104/73  Pulse: 61 66 70 (!) 55  Resp: 18 17  20   Temp: 98.4 F (36.9 C)   97.8 F (36.6 C)  TempSrc: Oral   Oral  SpO2: 94% 92%  100%  Weight: 93.9 kg     Height:        Intake/Output Summary (Last 24 hours) at 10/27/2018 1348 Last data filed at 10/27/2018 1300 Gross per 24 hour  Intake 720 ml  Output 2770 ml  Net -2050 ml   Filed Weights   10/25/18 0820 10/26/18 0340 10/27/18 0433  Weight: 96.3 kg 95.3 kg 93.9 kg    Exam:  . General: 78 y.o. year-old male blood per nursing no acute distress.  Alert and oriented x3.   . Cardiovascular: Regular rate and rhythm with no rubs or gallops.  No JVD or thyromegaly noted.   [name] Respiratory: Clear to auscultation with no wheezes or rales.  Good respiratory effort.   . Abdomen: Soft nontender nondistended normal bowel sounds x4 quadrants.  . Musculoskeletal: Trace lower extremity edema.  2 out of 4 pulses in all 4 extremities.   [name] Psychiatry: Mood is appropriate for condition and setting..   Data Reviewed: CBC: Recent Labs  Lab 10/22/18 1050 10/22/18 2304 10/23/18 0206 10/24/18 0207 10/25/18  0430 10/26/18 0707  WBC 5.0 18.5* 22.1* 24.1* 19.8* 9.6  NEUTROABS 3.7  --   --   --   --   --   HGB 18.2* 16.8 15.9 14.4 15.2 16.0  HCT 54.3* 49.1 46.0 41.7 44.1 46.8  MCV 93.1 91.9 92.2 91.6 90.4 91.6  PLT 261 199 177 158 161 818   Basic Metabolic Panel: Recent Labs  Lab 10/22/18 1050 10/22/18 2015 10/23/18 0206 10/24/18 0207 10/25/18 0430  NA 141  --  141 139 142  K 4.4  --   4.3 3.9 4.1  CL 108  --  111 108 110  CO2 17*  --  18* 22 25  GLUCOSE 162*  --  81 99 108*  BUN 13  --  26* 23 18  CREATININE 1.24 1.97* 1.93* 1.31* 1.17  CALCIUM 10.3  --  9.3 9.0 9.6  MG  --   --   --  1.4* 1.9   GFR: Estimated Creatinine Clearance: 61.9 mL/min (by C-G formula based on SCr of 1.17 mg/dL). Liver Function Tests: Recent Labs  Lab 10/22/18 1050 10/24/18 0207 10/25/18 0430  AST 43* 34 25  ALT 25 31 29   ALKPHOS 66 43 53  BILITOT 1.8* 1.0 1.0  PROT 7.1 5.3* 5.7*  ALBUMIN 3.7 2.6* 2.8*   No results for input(s): LIPASE, AMYLASE in the last 168 hours. No results for input(s): AMMONIA in the last 168 hours. Coagulation Profile: No results for input(s): INR, PROTIME in the last 168 hours. Cardiac Enzymes: Recent Labs  Lab 10/22/18 1050 10/22/18 2015 10/23/18 0206 10/23/18 0730  TROPONINI 0.07* 0.26* 0.27* 0.20*   BNP (last 3 results) No results for input(s): PROBNP in the last 8760 hours. HbA1C: No results for input(s): HGBA1C in the last 72 hours. CBG: No results for input(s): GLUCAP in the last 168 hours. Lipid Profile: No results for input(s): CHOL, HDL, LDLCALC, TRIG, CHOLHDL, LDLDIRECT in the last 72 hours. Thyroid Function Tests: No results for input(s): TSH, T4TOTAL, FREET4, T3FREE, THYROIDAB in the last 72 hours. Anemia Panel: No results for input(s): VITAMINB12, FOLATE, FERRITIN, TIBC, IRON, RETICCTPCT in the last 72 hours. Urine analysis:    Component Value Date/Time   COLORURINE YELLOW 10/22/2018 1122   APPEARANCEUR HAZY (A) 10/22/2018 1122   LABSPEC 1.010 10/22/2018 1122   PHURINE 5.0 10/22/2018 1122   GLUCOSEU NEGATIVE 10/22/2018 1122   GLUCOSEU NEGATIVE 06/22/2017 1116   HGBUR SMALL (A) 10/22/2018 1122   BILIRUBINUR NEGATIVE 10/22/2018 1122   KETONESUR NEGATIVE 10/22/2018 1122   PROTEINUR 100 (A) 10/22/2018 1122   UROBILINOGEN 0.2 06/22/2017 1116   NITRITE NEGATIVE 10/22/2018 1122   LEUKOCYTESUR NEGATIVE 10/22/2018 1122   Sepsis  Labs: @LABRCNTIP (procalcitonin:4,lacticidven:4)  ) Recent Results (from the past 240 hour(s))  SARS Coronavirus 2 (CEPHEID- Performed in [place] hospital lab), Hosp Order     Status: None   Collection Time: 10/22/18 10:47 AM  Result Value Ref Range Status   SARS Coronavirus 2 NEGATIVE NEGATIVE Final    Comment: (NOTE) If result is NEGATIVE SARS-CoV-2 target nucleic acids are NOT DETECTED. The SARS-CoV-2 RNA is generally detectable in upper and lower  respiratory specimens during the acute phase of infection. The lowest  concentration of SARS-CoV-2 viral copies this assay can detect is 250  copies / mL. A negative result does not preclude SARS-CoV-2 infection  and should not be used as the sole basis for treatment or other  patient management decisions.  A negative result may occur with  improper specimen collection / handling, submission of specimen other  than nasopharyngeal swab, presence of viral mutation(s) within the  areas targeted by this assay, and inadequate number of viral copies  (<250 copies / mL). A negative result must be combined with clinical  observations, patient history, and epidemiological information. If result is POSITIVE SARS-CoV-2 target nucleic acids are DETECTED. The SARS-CoV-2 RNA is generally detectable in upper and lower  respiratory specimens dur ing the acute phase of infection.  Positive  results are indicative of active infection with SARS-CoV-2.  Clinical  correlation with patient history and other diagnostic information is  necessary to determine patient infection status.  Positive results do  not rule out bacterial infection or co-infection with other viruses. If result is PRESUMPTIVE POSTIVE SARS-CoV-2 nucleic acids MAY BE PRESENT.   A presumptive positive result was obtained on the submitted specimen  and confirmed on repeat testing.  While 2019 novel coronavirus  (SARS-CoV-2) nucleic acids may be present in the submitted sample   additional confirmatory testing may be necessary for epidemiological  and / or clinical management purposes  to differentiate between  SARS-CoV-2 and other Sarbecovirus currently known to infect humans.  If clinically indicated additional testing with an alternate test  methodology [phone]) is advised. The SARS-CoV-2 RNA is generally  detectable in upper and lower respiratory sp ecimens during the acute  phase of infection. The expected result is Negative. Fact Sheet for Patients:  StrictlyIdeas.no Fact Sheet for Healthcare Providers: BankingDealers.co.za This test is not yet approved or cleared by the [place] FDA and has been authorized for detection and/or diagnosis of SARS-CoV-2 by FDA under an Emergency Use Authorization (EUA).  This EUA will remain in effect (meaning this test can be used) for the duration of the COVID-19 declaration under Section 564(b)(1) of the Act, 21 U.S.C. section 360bbb-3(b)(1), unless the authorization is terminated or revoked sooner. Performed at [facility], [place] [address] [place], [place] [address]   Blood culture (routine x 2)     Status: Abnormal   Collection Time: 10/22/18 10:52 AM  Result Value Ref Range Status   Specimen Description BLOOD BLOOD LEFT FOREARM  Final   Special Requests   Final    BOTTLES DRAWN AEROBIC AND ANAEROBIC Blood Culture adequate volume   Culture  Setup Time   Final    IN BOTH AEROBIC AND ANAEROBIC BOTTLES GRAM POSITIVE COCCI CRITICAL RESULT CALLED TO, READ BACK BY AND VERIFIED WITH: J ORIET PHARMD 10/22/18 2300 JDW    Culture (A)  Final    STREPTOCOCCUS GALLOLYTICUS SUSCEPTIBILITIES PERFORMED ON PREVIOUS CULTURE WITHIN THE LAST 5 DAYS. Performed at [facility], [place] [address], [place],  [address]    Report Status 10/24/2018 FINAL  Final  Blood Culture ID Panel (Reflexed)     Status: Abnormal   Collection Time: 10/22/18 10:52 AM  Result Value  Ref Range Status   Enterococcus species NOT DETECTED NOT DETECTED Final   Listeria monocytogenes NOT DETECTED NOT DETECTED Final   Staphylococcus species NOT DETECTED NOT DETECTED Final   Staphylococcus aureus (BCID) NOT DETECTED NOT DETECTED Final   Streptococcus species DETECTED (A) NOT DETECTED Final    Comment: Not Enterococcus species, Streptococcus agalactiae, Streptococcus pyogenes, or Streptococcus pneumoniae. CRITICAL RESULT CALLED TO, READ BACK BY AND VERIFIED WITH: J ORIET PHARMD 10/22/18 2300 JDW    Streptococcus agalactiae NOT DETECTED NOT DETECTED Final   Streptococcus pneumoniae NOT DETECTED NOT DETECTED Final   Streptococcus pyogenes NOT DETECTED  NOT DETECTED Final   Acinetobacter baumannii NOT DETECTED NOT DETECTED Final   Enterobacteriaceae species NOT DETECTED NOT DETECTED Final   Enterobacter cloacae complex NOT DETECTED NOT DETECTED Final   Escherichia coli NOT DETECTED NOT DETECTED Final   Klebsiella oxytoca NOT DETECTED NOT DETECTED Final   Klebsiella pneumoniae NOT DETECTED NOT DETECTED Final   Proteus species NOT DETECTED NOT DETECTED Final   Serratia marcescens NOT DETECTED NOT DETECTED Final   Haemophilus influenzae NOT DETECTED NOT DETECTED Final   Neisseria meningitidis NOT DETECTED NOT DETECTED Final   Pseudomonas aeruginosa NOT DETECTED NOT DETECTED Final   Candida albicans NOT DETECTED NOT DETECTED Final   Candida glabrata NOT DETECTED NOT DETECTED Final   Candida krusei NOT DETECTED NOT DETECTED Final   Candida parapsilosis NOT DETECTED NOT DETECTED Final   Candida tropicalis NOT DETECTED NOT DETECTED Final    Comment: Performed at [facility], [place] [address] [place], [place] [address]  Urine culture     Status: Abnormal   Collection Time: 10/22/18 11:22 AM  Result Value Ref Range Status   Specimen Description URINE, RANDOM  Final   Special Requests NONE  Final   Culture (A)  Final    <10,000 COLONIES/mL INSIGNIFICANT GROWTH Performed at  [place] [address] [place], [place] [address]    Report Status 10/23/2018 FINAL  Final  Blood culture (routine x 2)     Status: Abnormal   Collection Time: 10/22/18 11:23 AM  Result Value Ref Range Status   Specimen Description BLOOD RIGHT ANTECUBITAL  Final   Special Requests   Final    BOTTLES DRAWN AEROBIC AND ANAEROBIC Blood Culture adequate volume   Culture  Setup Time   Final    IN BOTH AEROBIC AND ANAEROBIC BOTTLES GRAM POSITIVE COCCI CRITICAL VALUE NOTED.  VALUE IS CONSISTENT WITH PREVIOUSLY REPORTED AND CALLED VALUE. Performed at [facility], [place] [address], [place], [place] [address]    Culture STREPTOCOCCUS GALLOLYTICUS (A)  Final   Report Status 10/24/2018 FINAL  Final   Organism ID, Bacteria STREPTOCOCCUS GALLOLYTICUS  Final      Susceptibility   Streptococcus gallolyticus - MIC*    PENICILLIN 0.12 SENSITIVE Sensitive     CEFTRIAXONE <=0.12 SENSITIVE Sensitive     ERYTHROMYCIN <=0.12 SENSITIVE Sensitive     LEVOFLOXACIN 2 SENSITIVE Sensitive     VANCOMYCIN <=0.12 SENSITIVE Sensitive     * STREPTOCOCCUS GALLOLYTICUS  Respiratory Panel by PCR     Status: None   Collection Time: 10/22/18  5:44 PM  Result Value Ref Range Status   Adenovirus NOT DETECTED NOT DETECTED Final   Coronavirus 229E NOT DETECTED NOT DETECTED Final    Comment: (NOTE) The Coronavirus on the Respiratory Panel, DOES NOT test for the novel  Coronavirus (2019 nCoV)    Coronavirus HKU1 NOT DETECTED NOT DETECTED Final   Coronavirus NL63 NOT DETECTED NOT DETECTED Final   Coronavirus OC43 NOT DETECTED NOT DETECTED Final   Metapneumovirus NOT DETECTED NOT DETECTED Final   Rhinovirus / Enterovirus NOT DETECTED NOT DETECTED Final   Influenza A NOT DETECTED NOT DETECTED Final   Influenza B NOT DETECTED NOT DETECTED Final   Parainfluenza Virus 1 NOT DETECTED NOT DETECTED Final   Parainfluenza Virus 2 NOT DETECTED NOT DETECTED Final   Parainfluenza Virus 3 NOT DETECTED NOT DETECTED  Final   Parainfluenza Virus 4 NOT DETECTED NOT DETECTED Final   Respiratory Syncytial Virus NOT DETECTED NOT DETECTED Final  Bordetella pertussis NOT DETECTED NOT DETECTED Final   Chlamydophila pneumoniae NOT DETECTED NOT DETECTED Final   Mycoplasma pneumoniae NOT DETECTED NOT DETECTED Final    Comment: Performed at [facility], [place] [address] [place], [place] [address]  SARS Coronavirus 2 (CEPHEID- Performed in [place] hospital lab), Hosp Order     Status: None   Collection Time: 10/22/18  5:44 PM  Result Value Ref Range Status   SARS Coronavirus 2 NEGATIVE NEGATIVE Final    Comment: (NOTE) If result is NEGATIVE SARS-CoV-2 target nucleic acids are NOT DETECTED. The SARS-CoV-2 RNA is generally detectable in upper and lower  respiratory specimens during the acute phase of infection. The lowest  concentration of SARS-CoV-2 viral copies this assay can detect is 250  copies / mL. A negative result does not preclude SARS-CoV-2 infection  and should not be used as the sole basis for treatment or other  patient management decisions.  A negative result may occur with  improper specimen collection / handling, submission of specimen other  than nasopharyngeal swab, presence of viral mutation(s) within the  areas targeted by this assay, and inadequate number of viral copies  (<250 copies / mL). A negative result must be combined with clinical  observations, patient history, and epidemiological information. If result is POSITIVE SARS-CoV-2 target nucleic acids are DETECTED. The SARS-CoV-2 RNA is generally detectable in upper and lower  respiratory specimens dur ing the acute phase of infection.  Positive  results are indicative of active infection with SARS-CoV-2.  Clinical  correlation with patient history and other diagnostic information is  necessary to determine patient infection status.  Positive results do  not rule out bacterial infection or co-infection with other viruses.  If result is PRESUMPTIVE POSTIVE SARS-CoV-2 nucleic acids MAY BE PRESENT.   A presumptive positive result was obtained on the submitted specimen  and confirmed on repeat testing.  While 2019 novel coronavirus  (SARS-CoV-2) nucleic acids may be present in the submitted sample  additional confirmatory testing may be necessary for epidemiological  and / or clinical management purposes  to differentiate between  SARS-CoV-2 and other Sarbecovirus currently known to infect humans.  If clinically indicated additional testing with an alternate test  methodology [phone]) is advised. The SARS-CoV-2 RNA is generally  detectable in upper and lower respiratory sp ecimens during the acute  phase of infection. The expected result is Negative. Fact Sheet for Patients:  StrictlyIdeas.no Fact Sheet for Healthcare Providers: BankingDealers.co.za This test is not yet approved or cleared by the [place] FDA and has been authorized for detection and/or diagnosis of SARS-CoV-2 by FDA under an Emergency Use Authorization (EUA).  This EUA will remain in effect (meaning this test can be used) for the duration of the COVID-19 declaration under Section 564(b)(1) of the Act, 21 U.S.C. section 360bbb-3(b)(1), unless the authorization is terminated or revoked sooner. Performed at [facility], [place] [address] [place], [place] [address]   Urine culture     Status: None   Collection Time: 10/22/18  8:29 PM  Result Value Ref Range Status   Specimen Description URINE, RANDOM  Final   Special Requests NONE  Final   Culture   Final    NO GROWTH Performed at [facility], [place] [address], [place], [place] [address]    Report Status 10/24/2018 FINAL  Final  Culture, blood (routine x 2)     Status: None (Preliminary result)   Collection Time: 10/23/18  7:45  AM  Result Value Ref Range Status   Specimen Description BLOOD LEFT ANTECUBITAL  Final   Special  Requests   Final    BOTTLES DRAWN AEROBIC ONLY Blood Culture adequate volume   Culture   Final    NO GROWTH 4 DAYS Performed at [facility], [address], [place], [place] [address]    Report Status PENDING  Incomplete  Culture, blood (routine x 2)     Status: None (Preliminary result)   Collection Time: 10/23/18  7:53 AM  Result Value Ref Range Status   Specimen Description BLOOD RIGHT HAND  Final   Special Requests   Final    BOTTLES DRAWN AEROBIC ONLY Blood Culture adequate volume   Culture   Final    NO GROWTH 4 DAYS Performed at [facility], [place] [address], [place], [place] [address]    Report Status PENDING  Incomplete  Culture, blood (routine x 2)     Status: None (Preliminary result)   Collection Time: 10/26/18  7:07 AM  Result Value Ref Range Status   Specimen Description BLOOD LEFT ANTECUBITAL  Final   Special Requests   Final    BOTTLES DRAWN AEROBIC AND ANAEROBIC Blood Culture adequate volume   Culture   Final    NO GROWTH 1 DAY Performed at [facility], [place] [address], [place], [place] [address]    Report Status PENDING  Incomplete  Culture, blood (routine x 2)     Status: None (Preliminary result)   Collection Time: 10/26/18  7:21 AM  Result Value Ref Range Status   Specimen Description BLOOD BLOOD LEFT HAND  Final   Special Requests   Final    BOTTLES DRAWN AEROBIC AND ANAEROBIC Blood Culture adequate volume   Culture   Final    NO GROWTH 1 DAY Performed at [facility], [place] [address], [place], [place] [address]    Report Status PENDING  Incomplete      Studies: [place] Ekg Site Rite  Result Date: 10/26/2018 If [facility] image not attached, placement could not be confirmed due to current cardiac rhythm.   Scheduled Meds: . anastrozole  1 mg Oral Once per day on Mon Thu  . aspirin EC  81 mg Oral Daily  . ezetimibe  10 mg Oral Daily  . fluticasone  2 spray Each Nare Daily  . ipratropium-albuterol  3 mL Nebulization BID  .  pantoprazole  40 mg Oral Daily  . pravastatin  40 mg Oral q1800  . sodium chloride flush  3 mL Intravenous Q12H  . tamsulosin  0.4 mg Oral QPC breakfast  . thyroid  120 mg Oral QAC breakfast  . thyroid  60 mg Oral QPM    Continuous Infusions: . cefTRIAXone (ROCEPHIN)  IV 2 g (10/26/18 1722)     LOS: 5 days     Kayleen Memos, MD [facility] Pager [phone]  If 7PM-7AM, please contact night-coverage [email or web address] Password [email or web address] 10/27/2018, 1:48 PM

## 2018-10-27 NOTE — Evaluation (Signed)
Physical Therapy Evaluation/ Discharge Patient Details Name: [name] Glass MRN: [id number] DOB: Jun 17, 1940 Today's Date: 10/27/2018   History of Present Illness  78 y.o. male with history h/o hyperlipidemia, hypertension, CAD status post PTCA x 6, with last stent in January, GERD, arthritis, hiatal hernia, sleep apnea, Rt TKA. Admitted 5/19 with complaints of productive cough and dyspnea with pacemaker sytem bacteremia s/p pacer removal 5/22  Clinical Impression  Pt standing on arrival stating he is feeling much better and ready to move. Pt able to walk long hall distance, perform stairs, change in direction and speed all without LOB or assist. Pt reports being very active on jet ski, snow mobile, etc and planning to return to his normal activities. Pt educated for pacemaker restrictions with reimplantation and if pt should require assist or further education at that time please reorder. Currently no further needs and will sign off. Encouraged pt to walk halls daily.     Follow Up Recommendations No PT follow up    Equipment Recommendations  None recommended by PT    Recommendations for Other Services       Precautions / Restrictions Precautions Precautions: None      Mobility  Bed Mobility               General bed mobility comments: standing on arrival  Transfers Overall transfer level: Independent                  Ambulation/Gait Ambulation/Gait assistance: Independent Gait Distance (Feet): 600 Feet Assistive device: None Gait Pattern/deviations: WFL(Within Functional Limits)   Gait velocity interpretation: >4.37 ft/sec, indicative of normal walking speed    Stairs Stairs: Yes Stairs assistance: Modified independent (Device/Increase time) Stair Management: One rail Right;Alternating pattern;Forwards Number of Stairs: 11    Wheelchair Mobility    Modified Rankin (Stroke Patients Only)       Balance Overall balance assessment: No apparent  balance deficits (not formally assessed)                                           Pertinent Vitals/Pain Pain Assessment: No/denies pain    Home Living Family/patient expects to be discharged to:: Private residence Living Arrangements: Spouse/significant other Available Help at Discharge: Family;Available 24 hours/day Type of Home: House Home Access: Stairs to enter   [employer] of Steps: 2 Home Layout: Multi-level;Able to live on main level with bedroom/bathroom Home Equipment: Gilford Rile - 2 wheels;Bedside commode      Prior Function Level of Independence: Independent               Hand Dominance        Extremity/Trunk Assessment   Upper Extremity Assessment Upper Extremity Assessment: Overall WFL for tasks assessed    Lower Extremity Assessment Lower Extremity Assessment: Overall WFL for tasks assessed    Cervical / Trunk Assessment Cervical / Trunk Assessment: Normal  Communication   Communication: No difficulties  Cognition Arousal/Alertness: Awake/alert Behavior During Therapy: WFL for tasks assessed/performed Overall Cognitive Status: Within Functional Limits for tasks assessed                                        General Comments      Exercises     Assessment/Plan    PT Assessment Patent does  not need any further PT services  PT Problem List         PT Treatment Interventions      PT Goals (Current goals can be found in the Care Plan section)  Acute Rehab PT Goals PT Goal Formulation: All assessment and education complete, DC therapy    Frequency     Barriers to discharge        Co-evaluation               AM-PAC PT "6 Clicks" Mobility  Outcome Measure Help needed turning from your back to your side while in a flat bed without using bedrails?: None Help needed moving from lying on your back to sitting on the side of a flat bed without using bedrails?: None Help needed moving to  and from a bed to a chair (including a wheelchair)?: None Help needed standing up from a chair using your arms (e.g., wheelchair or bedside chair)?: None Help needed to walk in hospital room?: None Help needed climbing 3-5 steps with a railing? : None 6 Click Score: 24    End of Session   Activity Tolerance: Patient tolerated treatment well Patient left: in chair;with call bell/phone within reach Nurse Communication: Mobility status PT Visit Diagnosis: Other abnormalities of gait and mobility (R26.89)    Time: 6226-3335 PT Time Calculation (min) (ACUTE ONLY): 10 min   Charges:   PT Evaluation $PT Eval Low Complexity: [place], PT Acute Rehabilitation Services Pager: [phone] Office: [facility] 10/27/2018, 12:29 PM

## 2018-10-28 ENCOUNTER — Inpatient Hospital Stay: Payer: Self-pay

## 2018-10-28 ENCOUNTER — Encounter (HOSPITAL_COMMUNITY): Payer: Self-pay | Admitting: Cardiovascular Disease

## 2018-10-28 DIAGNOSIS — R350 Frequency of micturition: Secondary | ICD-10-CM

## 2018-10-28 DIAGNOSIS — T827XXA Infection and inflammatory reaction due to other cardiac and vascular devices, implants and grafts, initial encounter: Principal | ICD-10-CM

## 2018-10-28 DIAGNOSIS — R300 Dysuria: Secondary | ICD-10-CM

## 2018-10-28 LAB — BASIC METABOLIC PANEL
Anion gap: 11 (ref 5–15)
BUN: 15 mg/dL (ref 8–23)
CO2: 24 mmol/L (ref 22–32)
Calcium: 9.4 mg/dL (ref 8.9–10.3)
Chloride: 106 mmol/L (ref 98–111)
Creatinine, Ser: 1.02 mg/dL (ref 0.61–1.24)
GFR calc Af Amer: >60 mL/min (ref >60)
GFR calc non Af Amer: >60 mL/min (ref >60)
Glucose, Bld: 110 mg/dL — ABNORMAL HIGH (ref 70–99)
Potassium: 3.8 mmol/L (ref 3.5–5.1)
Sodium: 141 mmol/L (ref 135–145)

## 2018-10-28 LAB — CBC WITH DIFFERENTIAL/PLATELET
Abs Immature Granulocytes: 0.29 10*3/uL — ABNORMAL HIGH (ref 0.00–0.07)
Basophils Absolute: 0.1 10*3/uL (ref 0.0–0.1)
Basophils Relative: 1 %
Eosinophils Absolute: 0.1 10*3/uL (ref 0.0–0.5)
Eosinophils Relative: 2 %
HCT: 48.1 % (ref 39.0–52.0)
Hemoglobin: 16.2 g/dL (ref 13.0–17.0)
Immature Granulocytes: 4 %
Lymphocytes Relative: 27 %
Lymphs Abs: 2.1 10*3/uL (ref 0.7–4.0)
MCH: 30.7 pg (ref 26.0–34.0)
MCHC: 33.7 g/dL (ref 30.0–36.0)
MCV: 91.3 fL (ref 80.0–100.0)
Monocytes Absolute: 1.7 10*3/uL — ABNORMAL HIGH (ref 0.1–1.0)
Monocytes Relative: 22 %
Neutro Abs: 3.6 10*3/uL (ref 1.7–7.7)
Neutrophils Relative %: 44 %
Platelets: 221 10*3/uL (ref 150–400)
RBC: 5.27 MIL/uL (ref 4.22–5.81)
RDW: 18.6 % — ABNORMAL HIGH (ref 11.5–15.5)
WBC: 8 10*3/uL (ref 4.0–10.5)
nRBC: 0.4 % — ABNORMAL HIGH (ref 0.0–0.2)

## 2018-10-28 LAB — CULTURE, BLOOD (ROUTINE X 2)
Culture: NO GROWTH
Culture: NO GROWTH
Special Requests: ADEQUATE
Special Requests: ADEQUATE

## 2018-10-28 LAB — URINALYSIS, ROUTINE W REFLEX MICROSCOPIC
Bacteria, UA: NONE SEEN
Bilirubin Urine: NEGATIVE
Glucose, UA: NEGATIVE mg/dL
Hgb urine dipstick: NEGATIVE
Ketones, ur: NEGATIVE mg/dL
Leukocytes,Ua: NEGATIVE
Nitrite: NEGATIVE
Protein, ur: 30 mg/dL — AB
Specific Gravity, Urine: 1.013 (ref 1.005–1.030)
pH: 6 (ref 5.0–8.0)

## 2018-10-28 NOTE — Progress Notes (Signed)
Progress Note   Subjective   Doing well today, the patient denies CP or SOB.  + dysuria with urgency/ frequency reported overnight.  Temp last night was 100.2  Inpatient Medications    Scheduled Meds: . anastrozole  1 mg Oral Once per day on Mon Thu  . aspirin EC  81 mg Oral Daily  . ezetimibe  10 mg Oral Daily  . fluticasone  2 spray Each Nare Daily  . ipratropium-albuterol  3 mL Nebulization BID  . pantoprazole  40 mg Oral Daily  . pravastatin  40 mg Oral q1800  . sodium chloride flush  3 mL Intravenous Q12H  . tamsulosin  0.4 mg Oral QPC breakfast  . thyroid  120 mg Oral QAC breakfast  . thyroid  60 mg Oral QPM   Continuous Infusions: . cefTRIAXone (ROCEPHIN)  IV Stopped (10/27/18 1718)   PRN Meds: acetaminophen, albuterol, HYDROmorphone (DILAUDID) injection, ondansetron **OR** ondansetron (ZOFRAN) IV, oxyCODONE   Vital Signs    Vitals:   10/28/18 0601 10/28/18 0603 10/28/18 0714 10/28/18 0838  BP: (!) 150/73   128/76  Pulse: 66   88  Resp: 18     Temp: 99 F (37.2 C)     TempSrc: Oral     SpO2: 94%  95%   Weight:  92.7 kg    Height:        Intake/Output Summary (Last 24 hours) at 10/28/2018 1015 Last data filed at 10/28/2018 1006 Gross per 24 hour  Intake 1143.32 ml  Output 4975 ml  Net -3831.68 ml   Filed Weights   10/26/18 0340 10/27/18 0433 10/28/18 0603  Weight: 95.3 kg 93.9 kg 92.7 kg    Telemetry    Sinus bradycardia with intermittent V pacing - Personally Reviewed  Physical Exam   GEN- The patient is well appearing, alert and oriented x 3 today.   Head- normocephalic, atraumatic Eyes-  Sclera clear, conjunctiva pink Ears- hearing intact Oropharynx- clear Neck- supple, Lungs-  normal work of breathing Heart- Regular rate and rhythm  GI- soft, NT, ND, + BS Extremities- no clubbing, cyanosis, or edema  Skin- L chest wound examined today.  Prolene sutures in place.    No warmth, tenderness.   Labs    Chemistry Recent Labs  Lab  10/22/18 1050  10/24/18 0207 10/25/18 0430 10/28/18 0540  NA 141   < > 139 142 141  K 4.4   < > 3.9 4.1 3.8  CL 108   < > 108 110 106  CO2 17*   < > 22 25 24   GLUCOSE 162*   < > 99 108* 110*  BUN 13   < > 23 18 15   CREATININE 1.24   < > 1.31* 1.17 1.02  CALCIUM 10.3   < > 9.0 9.6 9.4  PROT 7.1  --  5.3* 5.7*  --   ALBUMIN 3.7  --  2.6* 2.8*  --   AST 43*  --  34 25  --   ALT 25  --  31 29  --   ALKPHOS 66  --  43 53  --   BILITOT 1.8*  --  1.0 1.0  --   GFRNONAA 55*   < > 52* 59* >60  GFRAA >60   < > >60 >60 >60  ANIONGAP 16*   < > 9 7 11    < > = values in this interval not displayed.     Hematology Recent Labs  Lab 10/25/18 0430  10/26/18 0707 10/28/18 0540  WBC 19.8* 9.6 8.0  RBC 4.88 5.11 5.27  HGB 15.2 16.0 16.2  HCT 44.1 46.8 48.1  MCV 90.4 91.6 91.3  MCH 31.1 31.3 30.7  MCHC 34.5 34.2 33.7  RDW 18.4* 18.6* 18.6*  PLT 161 177 221    Cardiac Enzymes Recent Labs  Lab 10/22/18 1050 10/22/18 2015 10/23/18 0206 10/23/18 0730  TROPONINI 0.07* 0.26* 0.27* 0.20*   No results for input(s): TROPIPOC in the last 168 hours.      Assessment & Plan    1.  Strep bacteremia Doing well s/p pacing system removal  Receiving IV antibiotics as per ID. Consider pacemaker replacement mid to late week.  We want to continue to follow him clinically to make sure that L chest pocket is healing and ready before we proceed. Temp 100.2 overnight is noted.  Will need left arm PICC for antibiotics prior to discharge.  We want to save the right side for his new device.  Given dysuria, UA has been sent  Thompson Grayer MD, [facility] 10/28/2018 10:15 AM

## 2018-10-28 NOTE — Progress Notes (Signed)
Patient ID: [name] Glass, male   DOB: [birth year], 78 y.o.   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  I suspect that he also had a early lumbar infection.  All recent blood cultures are negative.  I feel it is safe to have a new, permanent pacemaker placed.  We will also go ahead and order left arm PICC placement.  PLAN: 1. Continue ceftriaxone 2. PICC line placement 3. 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 anastrozole  1 mg Oral Once per day on Mon Thu  . aspirin EC  81 mg Oral Daily  . ezetimibe  10 mg Oral Daily  . fluticasone  2 spray Each Nare Daily  . ipratropium-albuterol  3 mL Nebulization BID  . pantoprazole  40 mg Oral Daily  . pravastatin  40 mg Oral q1800  . sodium chloride flush  3 mL Intravenous Q12H  . tamsulosin  0.4 mg Oral QPC breakfast  . thyroid  120 mg Oral QAC breakfast  . thyroid  60 mg Oral QPM   Continuous Infusions: . cefTRIAXone (ROCEPHIN)  IV Stopped (10/27/18 1718)   PRN Meds:.acetaminophen, albuterol, HYDROmorphone (DILAUDID) injection, ondansetron **OR** ondansetron (ZOFRAN) IV, oxyCODONE   SUBJECTIVE: He is feeling much better with the exception of having developed some dysuria and urinary  frequency.  His low back pain is much improved.  Review of Systems: Review of Systems  Constitutional: Negative for chills, diaphoresis and fever.  Genitourinary: Positive for dysuria and frequency.  Musculoskeletal: Positive for back pain.    Allergies  Allergen Reactions  . Morphine And Related     Had "crazy dreams" felt "crazy" per pt.  . Bactrim [Sulfamethoxazole-Trimethoprim]   . Statins     OBJECTIVE: Vitals:   10/28/18 0601 10/28/18 0603 10/28/18 0714 10/28/18 0838  BP: (!) 150/73   128/76  Pulse: 66   88  Resp: 18     Temp: 99 F (37.2 C)     TempSrc: Oral     SpO2: 94%  95%   Weight:  92.7 kg    Height:       Body mass index is 27.72 kg/m.  Physical Exam Constitutional:      Comments: He is not bathing at the sink.  He is in good spirits.  Cardiovascular:     Rate and Rhythm: Normal rate and regular rhythm.     Heart sounds: No murmur.  Pulmonary:     Effort: Pulmonary effort is normal.     Breath sounds: Normal breath sounds.  Chest:    Abdominal:     Palpations: Abdomen is soft.     Tenderness: There is  no abdominal tenderness.  Psychiatric:        Mood and Affect: Mood normal.     Lab Results Lab Results  Component Value Date   WBC 8.0 10/28/2018   HGB 16.2 10/28/2018   HCT 48.1 10/28/2018   MCV 91.3 10/28/2018   PLT 221 10/28/2018    Lab Results  Component Value Date   CREATININE 1.02 10/28/2018   BUN 15 10/28/2018   NA 141 10/28/2018   K 3.8 10/28/2018   CL 106 10/28/2018   CO2 24 10/28/2018    Lab Results  Component Value Date   ALT 29 10/25/2018   AST 25 10/25/2018   ALKPHOS 53 10/25/2018   BILITOT 1.0 10/25/2018     Microbiology: Recent Results (from the past 240 hour(s))  SARS Coronavirus 2 (CEPHEID- Performed in [place] hospital lab), Hosp Order     Status: None   Collection Time: 10/22/18 10:47 AM  Result Value Ref Range Status   SARS Coronavirus 2 NEGATIVE NEGATIVE Final    Comment: (NOTE) If result is  NEGATIVE SARS-CoV-2 target nucleic acids are NOT DETECTED. The SARS-CoV-2 RNA is generally detectable in upper and lower  respiratory specimens during the acute phase of infection. The lowest  concentration of SARS-CoV-2 viral copies this assay can detect is 250  copies / mL. A negative result does not preclude SARS-CoV-2 infection  and should not be used as the sole basis for treatment or other  patient management decisions.  A negative result may occur with  improper specimen collection / handling, submission of specimen other  than nasopharyngeal swab, presence of viral mutation(s) within the  areas targeted by this assay, and inadequate number of viral copies  (<250 copies / mL). A negative result must be combined with clinical  observations, patient history, and epidemiological information. If result is POSITIVE SARS-CoV-2 target nucleic acids are DETECTED. The SARS-CoV-2 RNA is generally detectable in upper and lower  respiratory specimens dur ing the acute phase of infection.  Positive  results are indicative of active infection with SARS-CoV-2.  Clinical  correlation with patient history and other diagnostic information is  necessary to determine patient infection status.  Positive results do  not rule out bacterial infection or co-infection with other viruses. If result is PRESUMPTIVE POSTIVE SARS-CoV-2 nucleic acids MAY BE PRESENT.   A presumptive positive result was obtained on the submitted specimen  and confirmed on repeat testing.  While 2019 novel coronavirus  (SARS-CoV-2) nucleic acids may be present in the submitted sample  additional confirmatory testing may be necessary for epidemiological  and / or clinical management purposes  to differentiate between  SARS-CoV-2 and other Sarbecovirus currently known to infect humans.  If clinically indicated additional testing with an alternate test  methodology [phone]) is advised. The SARS-CoV-2 RNA is generally  detectable  in upper and lower respiratory sp ecimens during the acute  phase of infection. The expected result is Negative. Fact Sheet for Patients:  StrictlyIdeas.no Fact Sheet for Healthcare Providers: BankingDealers.co.za This test is not yet approved or cleared by the [place] FDA and has been authorized for detection and/or diagnosis of SARS-CoV-2 by FDA under an Emergency Use Authorization (EUA).  This EUA will remain in effect (meaning this test can be used) for the duration of the COVID-19 declaration under Section 564(b)(1) of the Act, 21 U.S.C. section 360bbb-3(b)(1), unless the authorization is terminated or revoked sooner. Performed at [facility], [place] [address], [place], [place] [address]  Blood culture (routine x 2)     Status: Abnormal   Collection Time: 10/22/18 10:52 AM  Result Value Ref Range Status   Specimen Description BLOOD BLOOD LEFT FOREARM  Final   Special Requests   Final    BOTTLES DRAWN AEROBIC AND ANAEROBIC Blood Culture adequate volume   Culture  Setup Time   Final    IN BOTH AEROBIC AND ANAEROBIC BOTTLES GRAM POSITIVE COCCI CRITICAL RESULT CALLED TO, READ BACK BY AND VERIFIED WITH: J ORIET PHARMD 10/22/18 2300 JDW    Culture (A)  Final    STREPTOCOCCUS GALLOLYTICUS SUSCEPTIBILITIES PERFORMED ON PREVIOUS CULTURE WITHIN THE LAST 5 DAYS. Performed at [facility], [place] [address], [place], [place] [address]    Report Status 10/24/2018 FINAL  Final  Blood Culture ID Panel (Reflexed)     Status: Abnormal   Collection Time: 10/22/18 10:52 AM  Result Value Ref Range Status   Enterococcus species NOT DETECTED NOT DETECTED Final   Listeria monocytogenes NOT DETECTED NOT DETECTED Final   Staphylococcus species NOT DETECTED NOT DETECTED Final   Staphylococcus aureus (BCID) NOT DETECTED NOT DETECTED Final   Streptococcus species DETECTED (A) NOT DETECTED Final    Comment: Not Enterococcus species,  Streptococcus agalactiae, Streptococcus pyogenes, or Streptococcus pneumoniae. CRITICAL RESULT CALLED TO, READ BACK BY AND VERIFIED WITH: J ORIET PHARMD 10/22/18 2300 JDW    Streptococcus agalactiae NOT DETECTED NOT DETECTED Final   Streptococcus pneumoniae NOT DETECTED NOT DETECTED Final   Streptococcus pyogenes NOT DETECTED NOT DETECTED Final   Acinetobacter baumannii NOT DETECTED NOT DETECTED Final   Enterobacteriaceae species NOT DETECTED NOT DETECTED Final   Enterobacter cloacae complex NOT DETECTED NOT DETECTED Final   Escherichia coli NOT DETECTED NOT DETECTED Final   Klebsiella oxytoca NOT DETECTED NOT DETECTED Final   Klebsiella pneumoniae NOT DETECTED NOT DETECTED Final   Proteus species NOT DETECTED NOT DETECTED Final   Serratia marcescens NOT DETECTED NOT DETECTED Final   Haemophilus influenzae NOT DETECTED NOT DETECTED Final   Neisseria meningitidis NOT DETECTED NOT DETECTED Final   Pseudomonas aeruginosa NOT DETECTED NOT DETECTED Final   Candida albicans NOT DETECTED NOT DETECTED Final   Candida glabrata NOT DETECTED NOT DETECTED Final   Candida krusei NOT DETECTED NOT DETECTED Final   Candida parapsilosis NOT DETECTED NOT DETECTED Final   Candida tropicalis NOT DETECTED NOT DETECTED Final    Comment: Performed at [facility], [place] [address] [place], [place] [address]  Urine culture     Status: Abnormal   Collection Time: 10/22/18 11:22 AM  Result Value Ref Range Status   Specimen Description URINE, RANDOM  Final   Special Requests NONE  Final   Culture (A)  Final    <10,000 COLONIES/mL INSIGNIFICANT GROWTH Performed at [place] [address] [place], [place] [address]    Report Status 10/23/2018 FINAL  Final  Blood culture (routine x 2)     Status: Abnormal   Collection Time: 10/22/18 11:23 AM  Result Value Ref Range Status   Specimen Description BLOOD RIGHT ANTECUBITAL  Final   Special Requests   Final    BOTTLES DRAWN AEROBIC AND ANAEROBIC  Blood Culture adequate volume   Culture  Setup Time   Final    IN BOTH AEROBIC AND ANAEROBIC BOTTLES GRAM POSITIVE COCCI CRITICAL VALUE NOTED.  VALUE IS CONSISTENT WITH PREVIOUSLY REPORTED AND CALLED VALUE. Performed at [facility], [place] [address] [place],  [address]  Culture STREPTOCOCCUS GALLOLYTICUS (A)  Final   Report Status 10/24/2018 FINAL  Final   Organism ID, Bacteria STREPTOCOCCUS GALLOLYTICUS  Final      Susceptibility   Streptococcus gallolyticus - MIC*    PENICILLIN 0.12 SENSITIVE Sensitive     CEFTRIAXONE <=0.12 SENSITIVE Sensitive     ERYTHROMYCIN <=0.12 SENSITIVE Sensitive     LEVOFLOXACIN 2 SENSITIVE Sensitive     VANCOMYCIN <=0.12 SENSITIVE Sensitive     * STREPTOCOCCUS GALLOLYTICUS  Respiratory Panel by PCR     Status: None   Collection Time: 10/22/18  5:44 PM  Result Value Ref Range Status   Adenovirus NOT DETECTED NOT DETECTED Final   Coronavirus 229E NOT DETECTED NOT DETECTED Final    Comment: (NOTE) The Coronavirus on the Respiratory Panel, DOES NOT test for the novel  Coronavirus (2019 nCoV)    Coronavirus HKU1 NOT DETECTED NOT DETECTED Final   Coronavirus NL63 NOT DETECTED NOT DETECTED Final   Coronavirus OC43 NOT DETECTED NOT DETECTED Final   Metapneumovirus NOT DETECTED NOT DETECTED Final   Rhinovirus / Enterovirus NOT DETECTED NOT DETECTED Final   Influenza A NOT DETECTED NOT DETECTED Final   Influenza B NOT DETECTED NOT DETECTED Final   Parainfluenza Virus 1 NOT DETECTED NOT DETECTED Final   Parainfluenza Virus 2 NOT DETECTED NOT DETECTED Final   Parainfluenza Virus 3 NOT DETECTED NOT DETECTED Final   Parainfluenza Virus 4 NOT DETECTED NOT DETECTED Final   Respiratory Syncytial Virus NOT DETECTED NOT DETECTED Final   Bordetella pertussis NOT DETECTED NOT DETECTED Final   Chlamydophila pneumoniae NOT DETECTED NOT DETECTED Final   Mycoplasma pneumoniae NOT DETECTED NOT DETECTED Final    Comment: Performed at [facility],  [place] [address], [place], [place] [address]  SARS Coronavirus 2 (CEPHEID- Performed in [place] hospital lab), Hosp Order     Status: None   Collection Time: 10/22/18  5:44 PM  Result Value Ref Range Status   SARS Coronavirus 2 NEGATIVE NEGATIVE Final    Comment: (NOTE) If result is NEGATIVE SARS-CoV-2 target nucleic acids are NOT DETECTED. The SARS-CoV-2 RNA is generally detectable in upper and lower  respiratory specimens during the acute phase of infection. The lowest  concentration of SARS-CoV-2 viral copies this assay can detect is 250  copies / mL. A negative result does not preclude SARS-CoV-2 infection  and should not be used as the sole basis for treatment or other  patient management decisions.  A negative result may occur with  improper specimen collection / handling, submission of specimen other  than nasopharyngeal swab, presence of viral mutation(s) within the  areas targeted by this assay, and inadequate number of viral copies  (<250 copies / mL). A negative result must be combined with clinical  observations, patient history, and epidemiological information. If result is POSITIVE SARS-CoV-2 target nucleic acids are DETECTED. The SARS-CoV-2 RNA is generally detectable in upper and lower  respiratory specimens dur ing the acute phase of infection.  Positive  results are indicative of active infection with SARS-CoV-2.  Clinical  correlation with patient history and other diagnostic information is  necessary to determine patient infection status.  Positive results do  not rule out bacterial infection or co-infection with other viruses. If result is PRESUMPTIVE POSTIVE SARS-CoV-2 nucleic acids MAY BE PRESENT.   A presumptive positive result was obtained on the submitted specimen  and confirmed on repeat testing.  While 2019 novel coronavirus  (SARS-CoV-2) nucleic acids may be present in the  submitted sample  additional confirmatory testing may be necessary for epidemiological   and / or clinical management purposes  to differentiate between  SARS-CoV-2 and other Sarbecovirus currently known to infect humans.  If clinically indicated additional testing with an alternate test  methodology [phone]) is advised. The SARS-CoV-2 RNA is generally  detectable in upper and lower respiratory sp ecimens during the acute  phase of infection. The expected result is Negative. Fact Sheet for Patients:  StrictlyIdeas.no Fact Sheet for Healthcare Providers: BankingDealers.co.za This test is not yet approved or cleared by the [place] FDA and has been authorized for detection and/or diagnosis of SARS-CoV-2 by FDA under an Emergency Use Authorization (EUA).  This EUA will remain in effect (meaning this test can be used) for the duration of the COVID-19 declaration under Section 564(b)(1) of the Act, 21 U.S.C. section 360bbb-3(b)(1), unless the authorization is terminated or revoked sooner. Performed at [facility], [place] [address], [place], [place] [address]   Urine culture     Status: None   Collection Time: 10/22/18  8:29 PM  Result Value Ref Range Status   Specimen Description URINE, RANDOM  Final   Special Requests NONE  Final   Culture   Final    NO GROWTH Performed at [facility], [place] [address] [place], [place] [address]    Report Status 10/24/2018 FINAL  Final  Culture, blood (routine x 2)     Status: None   Collection Time: 10/23/18  7:45 AM  Result Value Ref Range Status   Specimen Description BLOOD LEFT ANTECUBITAL  Final   Special Requests   Final    BOTTLES DRAWN AEROBIC ONLY Blood Culture adequate volume   Culture   Final    NO GROWTH 5 DAYS Performed at [facility], [place] [address] [place], [place] [address]    Report Status 10/28/2018 FINAL  Final  Culture, blood (routine x 2)     Status: None   Collection Time: 10/23/18  7:53 AM  Result Value Ref Range Status   Specimen  Description BLOOD RIGHT HAND  Final   Special Requests   Final    BOTTLES DRAWN AEROBIC ONLY Blood Culture adequate volume   Culture   Final    NO GROWTH 5 DAYS Performed at [facility], [place] [address] [place], [place] [address]    Report Status 10/28/2018 FINAL  Final  Culture, blood (routine x 2)     Status: None (Preliminary result)   Collection Time: 10/26/18  7:07 AM  Result Value Ref Range Status   Specimen Description BLOOD LEFT ANTECUBITAL  Final   Special Requests   Final    BOTTLES DRAWN AEROBIC AND ANAEROBIC Blood Culture adequate volume   Culture   Final    NO GROWTH 2 DAYS Performed at [facility], [place] [address] [place], [place] [address]    Report Status PENDING  Incomplete  Culture, blood (routine x 2)     Status: None (Preliminary result)   Collection Time: 10/26/18  7:21 AM  Result Value Ref Range Status   Specimen Description BLOOD BLOOD LEFT HAND  Final   Special Requests   Final    BOTTLES DRAWN AEROBIC AND ANAEROBIC Blood Culture adequate volume   Culture   Final    NO GROWTH 2 DAYS Performed at [facility], [place] [address], [employer] [place], [place] [address]    Report Status PENDING  Incomplete  Michel Bickers, MD [facility] [phone] pager   [phone] cell 10/28/2018, 10:26 AM

## 2018-10-28 NOTE — Progress Notes (Signed)
PROGRESS NOTE    [name] Glass  [name] Glass DOB: 01-03-1941 DOA: 10/22/2018 PCP: [name] Lima, MD    Brief Narrative: [name] Glass 78 y.o.malewith history h/ohyperlipidemia, hypertension, CAD status post PTCA x 6, with last stent in January, GERD, arthritis, hiatal hernia, sleep apnea presents to the ED today with complaints of productive cough and dyspnea. Patient states he has had on and off symptoms of bronchitis and received at least 2 courses of antibiotics within the last 3 months. On presentation to the ED he was noted to be febrile, tachycardic, dyspneic and hypoxic requiring 2 L of nasal cannula.Labs revealed elevated lactate at 4, sepsis protocol was initiated. Patient received IV fluids broad-spectrum antibiotics and underwent COVID-19 work-up as well as CT chest to rule out PE/pneumonia which were all unremarkable. Patient lives with his wife and denies any sick contacts. Skin redness noted along his trunk and face which he relates to sunburn.He denies any chest pain or leg swellings or headache or dizziness. He does report low back pain and points to right iliac/paraspinal area. He does have [name] Glass history of kidney stones but does not feel this pain is like prior episodes and denies any dysuria/hematuria. He drinks scotch daily but denies any history of alcohol withdrawal.  Patient does have long history of CAD and had 5 stents placed more than 10 years back by [name] Glass. He reports undergoing [name] Glass stress test in [place] at one point which was apparently good.He was [facility] and had an episode of syncope.He ultimately was transported by EMS to [facility] where he underwent cardiac catheterizationfor NSTEMI revealinghigh-grade calcified LAD stenosis requiring DESimplantation in Jan 2020.He has remained on aspirin and Brilinta since then.He also was noted to have long pauses on telemetry with recurrent syncopeduring  that hospitalizationand andunderwentpermanent pacemaker implantation.When seen by [name] Glass for follow-up on January 28, patient still had pocket staples in. He currently has tenderness at pacemaker site as well as [name] Glass dry whitish scab (which was also noted at follow-up visit with cardiologist in March). Patient denies noticing any discharge.He did receive [name] Glass course of abx (doxycycline) in January for cellulitis (apparently hadrt forearm cellulitis, rtknee injury from fall at [place] [facility]) and Omnicef in March (for pharyngitis/bronchitis)  PPM removed 10/25/2018.    Assessment & Plan:   Principal Problem:   Streptococcal Gallolyticus Bacteremia Active Problems:   Coronary artery disease   OA (osteoarthritis) of knee   Gastroesophageal reflux disease without esophagitis   Essential hypertension, benign   Hyperlipidemia with target LDL less than 70   Chronic renal disease, stage 2, mildly decreased glomerular filtration rate (GFR) between 60-89 mL/min/1.73 square meter   Sepsis ([place])   H/O splenectomy   Presence of permanent cardiac pacemaker   Sleep apnea   Acute low back pain   Chronic bronchitis (HCC)  Resolving sepsis secondary to strep Gallolyticus bacteremia status post pacing system removal on 10/25/2018  Suspected Early Lumbar Infection Presented with fever with T-max of 102.5, tachycardia with heart rate 133, tachypnea with respiration rate 32 Temp of 100.2 on 5/24  Blood cultures drawn on 10/22/2018+ for strep gallolyticus  5/20 and 5/23 repeat cultures so for NGTD  MRI spine with mild paraspinous edema involving R>L posterior paraspinous musculature (consider strain vs myositis).  No other evidence for acute infection within lumbar spine.  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  Will need to follow-up outpatient  with GI -> will need elective colonoscopy as outpatient. Self-reported last  colonoscopy was 3 years ago with GI [facility] Will need to follow-up with GI [place] outpatient at discharge S/p TEE on 10/25/2018 no valvular vegetation or ICD vegetation.  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 Post pacing system removal on 10/25/2018 Continue ceftriaxone per ID PICC line for long-term IV antibiotics - avoid right arm as this is being saved for his new device Infectious disease following Planning for pacemaker replacement mid to late week Repeat labs in the morning Electrophysiology following.  Highly appreciated.  Resolving acute hypoxic respiratory failure Work-up unrevealing CTA PE negative Chest x-ray unremarkable Brilinta has been discontinued due to her shortness of breath, started on Plavix o2 has been weaned, now on RA.  He attributes improvement in SOB to d/c of brilinta.  Elevated troponin likely demand ischemia Troponin peaked at 0.27 and trended down Cardiology following, appreciate recs  Prolonged QTC Improved to ~477 Magnesium normal Repeat twelve-lead EKG  Coronary artery disease status post PCI with stenting Brilinta discontinued due to shortness of breath and hypoxia On aspirin and Plavix Continue Zetia Cardiology following  PFO, incidentally Glass on TEE done on 10/25/2018 Per cards  History of splenectomy Blood culture will be repeated this afternoon on 10/25/2018  History of sick sinus syndrome Status post pacemaker  Resolving AKI on CKD 3 Baseline creatinine appears to be 1.11 with GFR 59 Creatinine improving 1.02 Peaked at 1.97 Monitor urine output Continue to avoid nephrotoxic agents Repeat BMP in the morning  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   DVT prophylaxis: SCD Code Status: full  Family Communication: none at bedside Disposition Plan:  pending pacemaker placement   Consultants:   Cards  ID  Procedures:  IMPRESSIONS    1. Pacing wires are seen in the RV and RA. No thrombi were seen on the pacing wires.  2. No evidence of Numa Schroeter thrombus present in the left atrial appendage.  3. The mitral valve is abnormal. Mild thickening of the mitral valve leaflet. Mild calcification of the mitral valve leaflet. Mitral valve regurgitation is moderate by color flow Doppler. No mitral valve vegetation visualized.  4. The aortic valve is abnormal Mild thickening of the aortic valve Moderate calcification of the aortic valve. Aortic valve regurgitation is mild to moderate by color flow Doppler. Mild stenosis of the aortic valve.  5. There is evidence of mild plaque in the descending aorta.  6. Small patent foramen ovale with predominantly left to right shunting across the atrial septum.  7. Evidence of atrial level shunting detected by color flow Doppler.  8. The left ventricle has normal systolic function, with an ejection fraction of 55-60%.  5/22 CONCLUSIONS:  1. Successful pacemaker explantation 2.  Implant of temp permanent pacemaker via the left IJ 3. No early apparent complications.   Echo IMPRESSIONS    1. The left ventricle has low normal systolic function, with an ejection fraction of 50-55%. The cavity size was normal. There is moderate concentric left ventricular hypertrophy. Left ventricular diastolic Doppler parameters are consistent with  pseudonormalization. Indeterminate filling pressures.  2. The right ventricle has normal systolic function. The cavity was normal. There is no increase in right ventricular wall thickness. Right ventricular systolic pressure could not be assessed.  3. Left atrial size was severely dilated.  4. The aortic valve has  an indeterminate number of cusps. Moderate thickening of the aortic valve. Moderate calcification of the aortic valve. Aortic valve regurgitation is mild by color flow  Doppler. Mild stenosis of the aortic valve.  5. The inferior vena cava was dilated in size with <50% respiratory variability.   Complications: No apparent complications Patient did tolerate procedure well.    Antimicrobials:  Anti-infectives (From admission, onward)   Start     Dose/Rate Route Frequency Ordered Stop   10/25/18 1700  cefTRIAXone (ROCEPHIN) 2 g in sodium chloride 0.9 % 100 mL IVPB     2 g 200 mL/hr over 30 Minutes Intravenous Every 24 hours 10/25/18 1332     10/25/18 1700  ceFAZolin (ANCEF) IVPB 1 g/50 mL premix  Status:  Discontinued     1 g 100 mL/hr over 30 Minutes Intravenous Every 6 hours 10/25/18 1425 10/25/18 1443   10/25/18 0900  gentamicin (GARAMYCIN) 80 mg in sodium chloride 0.9 % 500 mL irrigation  Status:  Discontinued     80 mg Irrigation On call 10/25/18 0752 10/25/18 0953   10/25/18 0900  ceFAZolin (ANCEF) IVPB 2g/100 mL premix  Status:  Discontinued     2 g 200 mL/hr over 30 Minutes Intravenous On call 10/25/18 0752 10/25/18 1357   10/24/18 1200  penicillin G potassium 12 Million Units in dextrose 5 % 500 mL continuous infusion  Status:  Discontinued     12 Million Units 41.7 mL/hr over 12 Hours Intravenous Every 12 hours 10/24/18 1019 10/25/18 1440   10/23/18 1200  cefTRIAXone (ROCEPHIN) 2 g in sodium chloride 0.9 % 100 mL IVPB  Status:  Discontinued     2 g 200 mL/hr over 30 Minutes Intravenous Every 24 hours 10/23/18 0902 10/24/18 1019   10/23/18 1000  ceFEPIme (MAXIPIME) 1 g in sodium chloride 0.9 % 100 mL IVPB  Status:  Discontinued     1 g 200 mL/hr over 30 Minutes Intravenous Every 12 hours 10/22/18 2000 10/22/18 2104   10/22/18 2300  ceFEPIme (MAXIPIME) 2 g in sodium chloride 0.9 % 100 mL IVPB  Status:  Discontinued     2 g 200 mL/hr over 30 Minutes Intravenous Every 12 hours 10/22/18 2104 10/23/18 0901   10/22/18 1100  ceFEPIme (MAXIPIME) 2 g in sodium chloride 0.9 % 100 mL IVPB     2 g 200 mL/hr over 30 Minutes Intravenous  Once 10/22/18  1050 10/22/18 1205   10/22/18 1100  vancomycin (VANCOCIN) IVPB 1000 mg/200 mL premix     1,000 mg 200 mL/hr over 60 Minutes Intravenous  Once 10/22/18 1050 10/22/18 1240         Subjective: Feels better C/o dysuria   Objective: Vitals:   10/28/18 0603 10/28/18 0714 10/28/18 0838 10/28/18 1154  BP:   128/76 130/70  Pulse:   88 (!) 48  Resp:    18  Temp:    97.7 F (36.5 C)  TempSrc:    Oral  SpO2:  95%  94%  Weight: 92.7 kg     Height:        Intake/Output Summary (Last 24 hours) at 10/28/2018 1446 Last data filed at 10/28/2018 1347 Gross per 24 hour  Intake 1383.32 ml  Output 4675 ml  Net -3291.68 ml   Filed Weights   10/26/18 0340 10/27/18 0433 10/28/18 0603  Weight: 95.3 kg 93.9 kg 92.7 kg    Examination:  General exam: Appears calm and comfortable  Respiratory system: Clear to auscultation. Respiratory effort normal.  Pacemaker pocket with c/d/i dressing. Cardiovascular system: S1 & S2 heard, RRR.  Gastrointestinal system: Abdomen is nondistended, soft and nontender.  Central nervous system: Alert and oriented. No focal neurological deficits. Extremities: no LEE Skin: No rashes, lesions or ulcers Psychiatry: Judgement and insight appear normal. Mood & affect appropriate.     Data Reviewed: I have personally reviewed following labs and imaging studies  CBC: Recent Labs  Lab 10/22/18 1050  10/23/18 0206 10/24/18 0207 10/25/18 0430 10/26/18 0707 10/28/18 0540  WBC 5.0   < > 22.1* 24.1* 19.8* 9.6 8.0  NEUTROABS 3.7  --   --   --   --   --  3.6  HGB 18.2*   < > 15.9 14.4 15.2 16.0 16.2  HCT 54.3*   < > 46.0 41.7 44.1 46.8 48.1  MCV 93.1   < > 92.2 91.6 90.4 91.6 91.3  PLT 261   < > 177 158 161 177 221   < > = values in this interval not displayed.   Basic Metabolic Panel: Recent Labs  Lab 10/22/18 1050 10/22/18 2015 10/23/18 0206 10/24/18 0207 10/25/18 0430 10/28/18 0540  NA 141  --  141 139 142 141  K 4.4  --  4.3 3.9 4.1 3.8  CL 108  --   111 108 110 106  CO2 17*  --  18* 22 25 24   GLUCOSE 162*  --  81 99 108* 110*  BUN 13  --  26* 23 18 15   CREATININE 1.24 1.97* 1.93* 1.31* 1.17 1.02  CALCIUM 10.3  --  9.3 9.0 9.6 9.4  MG  --   --   --  1.4* 1.9  --    GFR: Estimated Creatinine Clearance: 65.5 mL/min (by C-G formula based on SCr of 1.02 mg/dL). Liver Function Tests: Recent Labs  Lab 10/22/18 1050 10/24/18 0207 10/25/18 0430  AST 43* 34 25  ALT 25 31 29   ALKPHOS 66 43 53  BILITOT 1.8* 1.0 1.0  PROT 7.1 5.3* 5.7*  ALBUMIN 3.7 2.6* 2.8*   No results for input(s): LIPASE, AMYLASE in the last 168 hours. No results for input(s): AMMONIA in the last 168 hours. Coagulation Profile: No results for input(s): INR, PROTIME in the last 168 hours. Cardiac Enzymes: Recent Labs  Lab 10/22/18 1050 10/22/18 2015 10/23/18 0206 10/23/18 0730  TROPONINI 0.07* 0.26* 0.27* 0.20*   BNP (last 3 results) No results for input(s): PROBNP in the last 8760 hours. HbA1C: No results for input(s): HGBA1C in the last 72 hours. CBG: No results for input(s): GLUCAP in the last 168 hours. Lipid Profile: No results for input(s): CHOL, HDL, LDLCALC, TRIG, CHOLHDL, LDLDIRECT in the last 72 hours. Thyroid Function Tests: No results for input(s): TSH, T4TOTAL, FREET4, T3FREE, THYROIDAB in the last 72 hours. Anemia Panel: No results for input(s): VITAMINB12, FOLATE, FERRITIN, TIBC, IRON, RETICCTPCT in the last 72 hours. Sepsis Labs: Recent Labs  Lab 10/23/18 2046 10/23/18 2305 10/24/18 0207 10/24/18 0435  LATICACIDVEN 3.1* 3.2* 2.0* 1.8    Recent Results (from the past 240 hour(s))  SARS Coronavirus 2 (CEPHEID- Performed in [place] hospital lab), Hosp Order     Status: None   Collection Time: 10/22/18 10:47 AM  Result Value Ref Range Status   SARS Coronavirus 2 NEGATIVE NEGATIVE Final    Comment: (NOTE) If result is NEGATIVE SARS-CoV-2 target nucleic acids are NOT DETECTED. The SARS-CoV-2 RNA is generally detectable in  upper and lower  respiratory specimens during the acute phase  of infection. The lowest  concentration of SARS-CoV-2 viral copies this assay can detect is 250  copies / mL. Terreon Ekholm negative result does not preclude SARS-CoV-2 infection  and should not be used as the sole basis for treatment or other  patient management decisions.  Stephanny Tsutsui negative result may occur with  improper specimen collection / handling, submission of specimen other  than nasopharyngeal swab, presence of viral mutation(s) within the  areas targeted by this assay, and inadequate number of viral copies  (<250 copies / mL). Kennethia Lynes negative result must be combined with clinical  observations, patient history, and epidemiological information. If result is POSITIVE SARS-CoV-2 target nucleic acids are DETECTED. The SARS-CoV-2 RNA is generally detectable in upper and lower  respiratory specimens dur ing the acute phase of infection.  Positive  results are indicative of active infection with SARS-CoV-2.  Clinical  correlation with patient history and other diagnostic information is  necessary to determine patient infection status.  Positive results do  not rule out bacterial infection or co-infection with other viruses. If result is PRESUMPTIVE POSTIVE SARS-CoV-2 nucleic acids MAY BE PRESENT.   Kimble Hitchens presumptive positive result was obtained on the submitted specimen  and confirmed on repeat testing.  While 2019 novel coronavirus  (SARS-CoV-2) nucleic acids may be present in the submitted sample  additional confirmatory testing may be necessary for epidemiological  and / or clinical management purposes  to differentiate between  SARS-CoV-2 and other Sarbecovirus currently known to infect humans.  If clinically indicated additional testing with an alternate test  methodology [phone]) is advised. The SARS-CoV-2 RNA is generally  detectable in upper and lower respiratory sp ecimens during the acute  phase of infection. The expected result is  Negative. Fact Sheet for Patients:  StrictlyIdeas.no Fact Sheet for Healthcare Providers: BankingDealers.co.za This test is not yet approved or cleared by the [place] FDA and has been authorized for detection and/or diagnosis of SARS-CoV-2 by FDA under an Emergency Use Authorization (EUA).  This EUA will remain in effect (meaning this test can be used) for the duration of the COVID-19 declaration under Section 564(b)(1) of the Act, 21 U.S.C. section 360bbb-3(b)(1), unless the authorization is terminated or revoked sooner. Performed at [facility], [place] [name], [place], [place] [address]   Blood culture (routine x 2)     Status: Abnormal   Collection Time: 10/22/18 10:52 AM  Result Value Ref Range Status   Specimen Description BLOOD BLOOD LEFT FOREARM  Final   Special Requests   Final    BOTTLES DRAWN AEROBIC AND ANAEROBIC Blood Culture adequate volume   Culture  Setup Time   Final    IN BOTH AEROBIC AND ANAEROBIC BOTTLES GRAM POSITIVE COCCI CRITICAL RESULT CALLED TO, READ BACK BY AND VERIFIED WITH: J ORIET PHARMD 10/22/18 2300 JDW    Culture (Obie Kallenbach)  Final    STREPTOCOCCUS GALLOLYTICUS SUSCEPTIBILITIES PERFORMED ON PREVIOUS CULTURE WITHIN THE LAST 5 DAYS. Performed at [facility], [place] [name], [place], [place] [address]    Report Status 10/24/2018 FINAL  Final  Blood Culture ID Panel (Reflexed)     Status: Abnormal   Collection Time: 10/22/18 10:52 AM  Result Value Ref Range Status   Enterococcus species NOT DETECTED NOT DETECTED Final   Listeria monocytogenes NOT DETECTED NOT DETECTED Final   Staphylococcus species NOT DETECTED NOT DETECTED Final   Staphylococcus aureus (BCID) NOT DETECTED NOT DETECTED Final   Streptococcus species DETECTED ([place]) NOT DETECTED Final  Comment: Not Enterococcus species, Streptococcus agalactiae, Streptococcus pyogenes, or Streptococcus pneumoniae. CRITICAL RESULT CALLED TO, READ  BACK BY AND VERIFIED WITH: J ORIET PHARMD 10/22/18 2300 JDW    Streptococcus agalactiae NOT DETECTED NOT DETECTED Final   Streptococcus pneumoniae NOT DETECTED NOT DETECTED Final   Streptococcus pyogenes NOT DETECTED NOT DETECTED Final   Acinetobacter baumannii NOT DETECTED NOT DETECTED Final   Enterobacteriaceae species NOT DETECTED NOT DETECTED Final   Enterobacter cloacae complex NOT DETECTED NOT DETECTED Final   Escherichia coli NOT DETECTED NOT DETECTED Final   Klebsiella oxytoca NOT DETECTED NOT DETECTED Final   Klebsiella pneumoniae NOT DETECTED NOT DETECTED Final   Proteus species NOT DETECTED NOT DETECTED Final   Serratia marcescens NOT DETECTED NOT DETECTED Final   Haemophilus influenzae NOT DETECTED NOT DETECTED Final   Neisseria meningitidis NOT DETECTED NOT DETECTED Final   Pseudomonas aeruginosa NOT DETECTED NOT DETECTED Final   Candida albicans NOT DETECTED NOT DETECTED Final   Candida glabrata NOT DETECTED NOT DETECTED Final   Candida krusei NOT DETECTED NOT DETECTED Final   Candida parapsilosis NOT DETECTED NOT DETECTED Final   Candida tropicalis NOT DETECTED NOT DETECTED Final    Comment: Performed at [facility], [place] [address], [place], [place] [address]  Urine culture     Status: Abnormal   Collection Time: 10/22/18 11:22 AM  Result Value Ref Range Status   Specimen Description URINE, RANDOM  Final   Special Requests NONE  Final   Culture (Kamryn Gauthier)  Final    <10,000 COLONIES/mL INSIGNIFICANT GROWTH Performed at [facility] [name], [place], [place] [address]    Report Status 10/23/2018 FINAL  Final  Blood culture (routine x 2)     Status: Abnormal   Collection Time: 10/22/18 11:23 AM  Result Value Ref Range Status   Specimen Description BLOOD RIGHT ANTECUBITAL  Final   Special Requests   Final    BOTTLES DRAWN AEROBIC AND ANAEROBIC Blood Culture adequate volume   Culture  Setup Time   Final    IN BOTH AEROBIC AND ANAEROBIC BOTTLES GRAM  POSITIVE COCCI CRITICAL VALUE NOTED.  VALUE IS CONSISTENT WITH PREVIOUSLY REPORTED AND CALLED VALUE. Performed at [facility], [place] [address], [place],  [address]    Culture STREPTOCOCCUS GALLOLYTICUS (Cedric Mcclaine)  Final   Report Status 10/24/2018 FINAL  Final   Organism ID, Bacteria STREPTOCOCCUS GALLOLYTICUS  Final      Susceptibility   Streptococcus gallolyticus - MIC*    PENICILLIN 0.12 SENSITIVE Sensitive     CEFTRIAXONE <=0.12 SENSITIVE Sensitive     ERYTHROMYCIN <=0.12 SENSITIVE Sensitive     LEVOFLOXACIN 2 SENSITIVE Sensitive     VANCOMYCIN <=0.12 SENSITIVE Sensitive     * STREPTOCOCCUS GALLOLYTICUS  Respiratory Panel by PCR     Status: None   Collection Time: 10/22/18  5:44 PM  Result Value Ref Range Status   Adenovirus NOT DETECTED NOT DETECTED Final   Coronavirus 229E NOT DETECTED NOT DETECTED Final    Comment: (NOTE) The Coronavirus on the Respiratory Panel, DOES NOT test for the novel  Coronavirus (2019 nCoV)    Coronavirus HKU1 NOT DETECTED NOT DETECTED Final   Coronavirus NL63 NOT DETECTED NOT DETECTED Final   Coronavirus OC43 NOT DETECTED NOT DETECTED Final   Metapneumovirus NOT DETECTED NOT DETECTED Final   Rhinovirus / Enterovirus NOT DETECTED NOT DETECTED Final   Influenza Mahika Vanvoorhis NOT DETECTED NOT DETECTED Final   Influenza B NOT DETECTED NOT  DETECTED Final   Parainfluenza Virus 1 NOT DETECTED NOT DETECTED Final   Parainfluenza Virus 2 NOT DETECTED NOT DETECTED Final   Parainfluenza Virus 3 NOT DETECTED NOT DETECTED Final   Parainfluenza Virus 4 NOT DETECTED NOT DETECTED Final   Respiratory Syncytial Virus NOT DETECTED NOT DETECTED Final   Bordetella pertussis NOT DETECTED NOT DETECTED Final   Chlamydophila pneumoniae NOT DETECTED NOT DETECTED Final   Mycoplasma pneumoniae NOT DETECTED NOT DETECTED Final    Comment: Performed at [facility], [place] [address] [place], [place] [address]  SARS Coronavirus 2 (CEPHEID- Performed in [place] hospital lab),  Hosp Order     Status: None   Collection Time: 10/22/18  5:44 PM  Result Value Ref Range Status   SARS Coronavirus 2 NEGATIVE NEGATIVE Final    Comment: (NOTE) If result is NEGATIVE SARS-CoV-2 target nucleic acids are NOT DETECTED. The SARS-CoV-2 RNA is generally detectable in upper and lower  respiratory specimens during the acute phase of infection. The lowest  concentration of SARS-CoV-2 viral copies this assay can detect is 250  copies / mL. Erminia Mcnew negative result does not preclude SARS-CoV-2 infection  and should not be used as the sole basis for treatment or other  patient management decisions.  Juleon Narang negative result may occur with  improper specimen collection / handling, submission of specimen other  than nasopharyngeal swab, presence of viral mutation(s) within the  areas targeted by this assay, and inadequate number of viral copies  (<250 copies / mL). Kveon Casanas negative result must be combined with clinical  observations, patient history, and epidemiological information. If result is POSITIVE SARS-CoV-2 target nucleic acids are DETECTED. The SARS-CoV-2 RNA is generally detectable in upper and lower  respiratory specimens dur ing the acute phase of infection.  Positive  results are indicative of active infection with SARS-CoV-2.  Clinical  correlation with patient history and other diagnostic information is  necessary to determine patient infection status.  Positive results do  not rule out bacterial infection or co-infection with other viruses. If result is PRESUMPTIVE POSTIVE SARS-CoV-2 nucleic acids MAY BE PRESENT.   Mechele Kittleson presumptive positive result was obtained on the submitted specimen  and confirmed on repeat testing.  While 2019 novel coronavirus  (SARS-CoV-2) nucleic acids may be present in the submitted sample  additional confirmatory testing may be necessary for epidemiological  and / or clinical management purposes  to differentiate between  SARS-CoV-2 and other Sarbecovirus  currently known to infect humans.  If clinically indicated additional testing with an alternate test  methodology [phone]) is advised. The SARS-CoV-2 RNA is generally  detectable in upper and lower respiratory sp ecimens during the acute  phase of infection. The expected result is Negative. Fact Sheet for Patients:  StrictlyIdeas.no Fact Sheet for Healthcare Providers: BankingDealers.co.za This test is not yet approved or cleared by the [place] FDA and has been authorized for detection and/or diagnosis of SARS-CoV-2 by FDA under an Emergency Use Authorization (EUA).  This EUA will remain in effect (meaning this test can be used) for the duration of the COVID-19 declaration under Section 564(b)(1) of the Act, 21 U.S.C. section 360bbb-3(b)(1), unless the authorization is terminated or revoked sooner. Performed at [facility], [place] [address], [place], [place] [address]   Urine culture     Status: None   Collection Time: 10/22/18  8:29 PM  Result Value Ref Range Status   Specimen Description URINE, RANDOM  Final   Special Requests NONE  Final   Culture   Final    NO GROWTH Performed at [facility], [place] [address], [place], [place] [address]    Report Status 10/24/2018 FINAL  Final  Culture, blood (routine x 2)     Status: None   Collection Time: 10/23/18  7:45 AM  Result Value Ref Range Status   Specimen Description BLOOD LEFT ANTECUBITAL  Final   Special Requests   Final    BOTTLES DRAWN AEROBIC ONLY Blood Culture adequate volume   Culture   Final    NO GROWTH 5 DAYS Performed at [facility], [place] [address], [place], [place] [address]    Report Status 10/28/2018 FINAL  Final  Culture, blood (routine x 2)     Status: None   Collection Time: 10/23/18  7:53 AM  Result Value Ref Range Status   Specimen Description BLOOD RIGHT HAND  Final   Special Requests   Final    BOTTLES DRAWN AEROBIC ONLY Blood  Culture adequate volume   Culture   Final    NO GROWTH 5 DAYS Performed at [facility], [place] [address] [place], [place] [address]    Report Status 10/28/2018 FINAL  Final  Culture, blood (routine x 2)     Status: None (Preliminary result)   Collection Time: 10/26/18  7:07 AM  Result Value Ref Range Status   Specimen Description BLOOD LEFT ANTECUBITAL  Final   Special Requests   Final    BOTTLES DRAWN AEROBIC AND ANAEROBIC Blood Culture adequate volume   Culture   Final    NO GROWTH 2 DAYS Performed at [facility], [place] [address] [place], [place] [address]    Report Status PENDING  Incomplete  Culture, blood (routine x 2)     Status: None (Preliminary result)   Collection Time: 10/26/18  7:21 AM  Result Value Ref Range Status   Specimen Description BLOOD BLOOD LEFT HAND  Final   Special Requests   Final    BOTTLES DRAWN AEROBIC AND ANAEROBIC Blood Culture adequate volume   Culture   Final    NO GROWTH 2 DAYS Performed at [facility], [place] [address] [place], [place] [address]    Report Status PENDING  Incomplete         Radiology Studies: [place] Ekg Site Rite  Result Date: 10/28/2018 If Site Rite image not attached, placement could not be confirmed due to current cardiac rhythm.  [place] Ekg Site Rite  Result Date: 10/26/2018 If [facility] image not attached, placement could not be confirmed due to current cardiac rhythm.       Scheduled Meds: . anastrozole  1 mg Oral Once per day on Mon Thu  . aspirin EC  81 mg Oral Daily  . ezetimibe  10 mg Oral Daily  . fluticasone  2 spray Each Nare Daily  . ipratropium-albuterol  3 mL Nebulization BID  . pantoprazole  40 mg Oral Daily  . pravastatin  40 mg Oral q1800  . sodium chloride flush  3 mL Intravenous Q12H  . tamsulosin  0.4 mg Oral QPC breakfast  . thyroid  120 mg Oral QAC breakfast  . thyroid  60 mg Oral QPM   Continuous Infusions: . cefTRIAXone (ROCEPHIN)  IV Stopped (10/27/18 1718)     LOS:  6 days    Time spent: over 30 min    Fayrene Helper, MD [facility] Pager AMION  If 7PM-7AM, please contact  night-coverage [email or web address] Password [facility] 10/28/2018, 2:46 PM

## 2018-10-29 ENCOUNTER — Encounter (HOSPITAL_COMMUNITY): Payer: Self-pay | Admitting: Cardiology

## 2018-10-29 LAB — COMPREHENSIVE METABOLIC PANEL
ALT: 39 U/L (ref 0–44)
AST: 24 U/L (ref 15–41)
Albumin: 3.1 g/dL — ABNORMAL LOW (ref 3.5–5.0)
Alkaline Phosphatase: 53 U/L (ref 38–126)
Anion gap: 8 (ref 5–15)
BUN: 15 mg/dL (ref 8–23)
CO2: 26 mmol/L (ref 22–32)
Calcium: 9.7 mg/dL (ref 8.9–10.3)
Chloride: 108 mmol/L (ref 98–111)
Creatinine, Ser: 1.16 mg/dL (ref 0.61–1.24)
GFR calc Af Amer: >60 mL/min (ref >60)
GFR calc non Af Amer: 60 mL/min — ABNORMAL LOW (ref >60)
Glucose, Bld: 110 mg/dL — ABNORMAL HIGH (ref 70–99)
Potassium: 4.1 mmol/L (ref 3.5–5.1)
Sodium: 142 mmol/L (ref 135–145)
Total Bilirubin: 0.7 mg/dL (ref 0.3–1.2)
Total Protein: 6.4 g/dL — ABNORMAL LOW (ref 6.5–8.1)

## 2018-10-29 LAB — SURGICAL PCR SCREEN
MRSA, PCR: NEGATIVE
Staphylococcus aureus: NEGATIVE

## 2018-10-29 LAB — CBC
HCT: 48.6 % (ref 39.0–52.0)
Hemoglobin: 16.5 g/dL (ref 13.0–17.0)
MCH: 31.2 pg (ref 26.0–34.0)
MCHC: 34 g/dL (ref 30.0–36.0)
MCV: 91.9 fL (ref 80.0–100.0)
Platelets: 267 10*3/uL (ref 150–400)
RBC: 5.29 MIL/uL (ref 4.22–5.81)
RDW: 18.7 % — ABNORMAL HIGH (ref 11.5–15.5)
WBC: 9 10*3/uL (ref 4.0–10.5)
nRBC: 0 % (ref 0.0–0.2)

## 2018-10-29 LAB — MAGNESIUM: Magnesium: 1.9 mg/dL (ref 1.7–2.4)

## 2018-10-29 LAB — URINE CULTURE: Culture: NO GROWTH

## 2018-10-29 MED ORDER — SODIUM CHLORIDE 0.45 % IV SOLN
INTRAVENOUS | Status: DC
  Administered 2018-10-30 (×2): via INTRAVENOUS

## 2018-10-29 MED ORDER — SODIUM CHLORIDE 0.9 % IV SOLN
INTRAVENOUS | Status: DC
  Administered 2018-10-30 (×2): via INTRAVENOUS

## 2018-10-29 MED ORDER — CHLORHEXIDINE GLUCONATE 4 % EX LIQD
60.0000 mL | Freq: Once | CUTANEOUS | Status: AC
  Administered 2018-10-30: 4 via TOPICAL
  Filled 2018-10-29: qty 30

## 2018-10-29 MED ORDER — SODIUM CHLORIDE 0.9% FLUSH: 10.0000 mL | INTRAVENOUS | Status: DC | PRN

## 2018-10-29 MED ORDER — SODIUM CHLORIDE 0.9 % IV SOLN
80.0000 mg | INTRAVENOUS | Status: AC
  Administered 2018-10-30: 80 mg
  Filled 2018-10-29: qty 2

## 2018-10-29 MED ORDER — CLOPIDOGREL BISULFATE 75 MG PO TABS
75.0000 mg | ORAL_TABLET | Freq: Every day | ORAL | Status: DC
  Administered 2018-10-29 – 2018-10-31 (×3): 75 mg via ORAL
  Filled 2018-10-29 (×3): qty 1

## 2018-10-29 MED ORDER — CHLORHEXIDINE GLUCONATE 4 % EX LIQD
60.0000 mL | Freq: Once | CUTANEOUS | Status: AC
  Administered 2018-10-29: 4 via TOPICAL
  Filled 2018-10-29: qty 30

## 2018-10-29 MED ORDER — CEFAZOLIN SODIUM-DEXTROSE 2-4 GM/100ML-% IV SOLN
2.0000 g | INTRAVENOUS | Status: AC
  Administered 2018-10-30: 2 g via INTRAVENOUS
  Filled 2018-10-29: qty 100

## 2018-10-29 MED FILL — Lidocaine HCl Local Inj 1%: INTRAMUSCULAR | Qty: 80 | Status: AC

## 2018-10-29 NOTE — Progress Notes (Signed)
PROGRESS NOTE    [name] Glass  ZOX:[id number] DOB: [birth year] DOA: 10/22/2018 PCP: [name], MD    Brief Narrative: [name] Garfinkle [name] a 78 y.o.malewith history h/ohyperlipidemia, hypertension, CAD status post PTCA x 6, with last stent in January, GERD, arthritis, hiatal hernia, sleep apnea presents to the ED today with complaints of productive cough and dyspnea. Patient states he has had on and off symptoms of bronchitis and received at least 2 courses of antibiotics within the last 3 months. On presentation to the ED he was noted to be febrile, tachycardic, dyspneic and hypoxic requiring 2 L of nasal cannula.Labs revealed elevated lactate at 4, sepsis protocol was initiated. Patient received IV fluids broad-spectrum antibiotics and underwent COVID-19 work-up as well as CT chest to rule out PE/pneumonia which were all unremarkable. Patient lives with his wife and denies any sick contacts. Skin redness noted along his trunk and face which he relates to sunburn.He denies any chest pain or leg swellings or headache or dizziness. He does report low back pain and points to right iliac/paraspinal area. He does have a history of kidney stones but does not feel this pain is like prior episodes and denies any dysuria/hematuria. He drinks scotch daily but denies any history of alcohol withdrawal.  Patient does have long history of CAD and had 5 stents placed more than 10 years back by Dr [name]. He reports undergoing a stress test in [place] at one point which was apparently good.He was in [facility] and had an episode of syncope.He ultimately was transported by EMS to [facility] where he underwent cardiac catheterizationfor NSTEMI revealinghigh-grade calcified LAD stenosis requiring DESimplantation in Jan 2020.He has remained on aspirin and Brilinta since then.He also was noted to have long pauses on telemetry with recurrent syncopeduring  that hospitalizationand andunderwentpermanent pacemaker implantation.When seen by Dr. Alvester Chou for follow-up on January 28, patient still had pocket staples in. He currently has tenderness at pacemaker site as well as a dry whitish scab (which was also noted at follow-up visit with cardiologist in March). Patient denies noticing any discharge.He did receive a course of abx (doxycycline) in January for cellulitis (apparently hadrt forearm cellulitis, rtknee injury from fall at [facility]) and Omnicef in March (for pharyngitis/bronchitis)  Admitted for strep bacteremia thought 2/2 pacemaker pocket infection and suspected early lumbar infection.  Now s/p pacemaker removal on 5/22.  At this point in time, awaiting replacement of pacemaker and PICC placement.  Will be ready for discharge after pacemaker replaced and pt gets PICC.  Assessment & Plan:   Principal Problem:   Streptococcal Gallolyticus Bacteremia Active Problems:   Coronary artery disease   OA (osteoarthritis) of knee   Gastroesophageal reflux disease without esophagitis   Essential hypertension, benign   Hyperlipidemia with target LDL less than 70   Chronic renal disease, stage 2, mildly decreased glomerular filtration rate (GFR) between 60-89 mL/min/1.73 square meter   Sepsis ([place])   H/O splenectomy   Presence of permanent cardiac pacemaker   Sleep apnea   Acute low back pain   Chronic bronchitis (HCC)  Resolving sepsis secondary to strep Gallolyticus bacteremia status post pacing system removal on 10/25/2018  Suspected Early Lumbar Infection Presented with fever with T-max of 102.5, tachycardia with heart rate 133, tachypnea with respiration rate 32 Temp of 100.2 on 5/24 99.4 5/25  Blood cultures drawn on 10/22/2018+ for strep gallolyticus  5/20 and 5/23 repeat cultures so for NGTD  MRI spine with mild paraspinous  edema involving R>L posterior paraspinous musculature (consider strain vs myositis).  No other evidence  for acute infection within lumbar spine.  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  Will need to follow-up outpatient with GI -> will need elective colonoscopy as outpatient. Self-reported last colonoscopy was 3 years ago with [facility] Will need to follow-up with GI [facility] outpatient at discharge S/p TEE on 10/25/2018 no valvular vegetation or ICD vegetation.  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 Post pacing system removal on 10/25/2018 Continue ceftriaxone per ID PICC line for long-term IV antibiotics - avoid right arm as this is being saved for his new device (pending) Infectious disease following, now signed off Planning for pacemaker replacement mid to late week Repeat labs in the morning Electrophysiology following.  Highly appreciated.  Resolving acute hypoxic respiratory failure Work-up unrevealing CTA PE negative Chest x-ray unremarkable Brilinta has been discontinued due to her shortness of breath, started on Plavix o2 has been weaned, now on RA.  He attributes improvement in SOB to d/c of brilinta.  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  Follow cx.  DVT prophylaxis: SCD Code Status: full  Family Communication: none at bedside Disposition Plan: pending pacemaker placement   Consultants:   Cards  ID  Procedures:  IMPRESSIONS    1. Pacing wires are seen in the RV and RA. No thrombi were seen on the pacing wires.  2. No evidence of a thrombus present in the left atrial appendage.  3. The mitral valve is abnormal. Mild thickening of the mitral valve leaflet. Mild calcification of the mitral valve leaflet. Mitral valve regurgitation is moderate by color flow Doppler. No mitral valve vegetation visualized.  4. The aortic valve is abnormal Mild thickening of the aortic valve Moderate calcification of the aortic valve. Aortic valve regurgitation is mild to moderate by color flow Doppler. Mild stenosis of the aortic valve.  5. There is evidence of mild plaque in the descending aorta.  6. Small patent foramen ovale with predominantly left to right shunting across the atrial septum.  7. Evidence of atrial level shunting detected by color flow Doppler.  8. The left ventricle has normal systolic function, with an ejection fraction of 55-60%.  5/22 CONCLUSIONS:  1. Successful pacemaker explantation 2.  Implant of temp permanent pacemaker via the left IJ 3. No early apparent complications.   Echo IMPRESSIONS    1. The left ventricle has low normal systolic function, with an ejection fraction of 50-55%. The cavity size was normal. There is moderate concentric left ventricular hypertrophy. Left ventricular diastolic Doppler parameters are consistent with  pseudonormalization. Indeterminate filling pressures.  2.  The right ventricle has normal systolic function. The cavity was normal. There is no increase in right ventricular wall thickness. Right ventricular systolic  pressure could not be assessed.  3. Left atrial size was severely dilated.  4. The aortic valve has an indeterminate number of cusps. Moderate thickening of the aortic valve. Moderate calcification of the aortic valve. Aortic valve regurgitation is mild by color flow Doppler. Mild stenosis of the aortic valve.  5. The inferior vena cava was dilated in size with <50% respiratory variability.   Complications: No apparent complications Patient did tolerate procedure well.    Antimicrobials:  Anti-infectives (From admission, onward)   Start     Dose/Rate Route Frequency Ordered Stop   10/25/18 1700  cefTRIAXone (ROCEPHIN) 2 g in sodium chloride 0.9 % 100 mL IVPB     2 g 200 mL/hr over 30 Minutes Intravenous Every 24 hours 10/25/18 1332     10/25/18 1700  ceFAZolin (ANCEF) IVPB 1 g/50 mL premix  Status:  Discontinued     1 g 100 mL/hr over 30 Minutes Intravenous Every 6 hours 10/25/18 1425 10/25/18 1443   10/25/18 0900  gentamicin (GARAMYCIN) 80 mg in sodium chloride 0.9 % 500 mL irrigation  Status:  Discontinued     80 mg Irrigation On call 10/25/18 0752 10/25/18 0953   10/25/18 0900  ceFAZolin (ANCEF) IVPB 2g/100 mL premix  Status:  Discontinued     2 g 200 mL/hr over 30 Minutes Intravenous On call 10/25/18 0752 10/25/18 1357   10/24/18 1200  penicillin G potassium 12 Million Units in dextrose 5 % 500 mL continuous infusion  Status:  Discontinued     12 Million Units 41.7 mL/hr over 12 Hours Intravenous Every 12 hours 10/24/18 1019 10/25/18 1440   10/23/18 1200  cefTRIAXone (ROCEPHIN) 2 g in sodium chloride 0.9 % 100 mL IVPB  Status:  Discontinued     2 g 200 mL/hr over 30 Minutes Intravenous Every 24 hours 10/23/18 0902 10/24/18 1019   10/23/18 1000  ceFEPIme (MAXIPIME) 1 g in sodium chloride 0.9 % 100 mL IVPB  Status:  Discontinued     1 g 200 mL/hr over 30 Minutes Intravenous Every 12 hours 10/22/18 2000 10/22/18 2104   10/22/18 2300  ceFEPIme (MAXIPIME) 2 g in sodium chloride 0.9  % 100 mL IVPB  Status:  Discontinued     2 g 200 mL/hr over 30 Minutes Intravenous Every 12 hours 10/22/18 2104 10/23/18 0901   10/22/18 1100  ceFEPIme (MAXIPIME) 2 g in sodium chloride 0.9 % 100 mL IVPB     2 g 200 mL/hr over 30 Minutes Intravenous  Once 10/22/18 1050 10/22/18 1205   10/22/18 1100  vancomycin (VANCOCIN) IVPB 1000 mg/200 mL premix     1,000 mg 200 mL/hr over 60 Minutes Intravenous  Once 10/22/18 1050 10/22/18 1240         Subjective: Dysuria persistent. Feels better every day.  Objective: Vitals:   10/28/18 1917 10/28/18 1928 10/29/18 0507 10/29/18 1106  BP: (!) 144/75  135/78 120/71  Pulse: (!) 51  62 (!) 55  Resp: 18  18 20   Temp: 99.4 F (37.4 C)  98 F (36.7 C) 97.8 F (36.6 C)  TempSrc: Oral   Oral  SpO2: 95% 94% 92% 95%  Weight:   92.6 kg   Height:        Intake/Output Summary (Last 24 hours) at 10/29/2018 1446 Last data filed at 10/29/2018 1312 Gross per 24 hour  Intake  937 ml  Output 1475 ml  Net -538 ml   Filed Weights   10/27/18 0433 10/28/18 0603 10/29/18 0507  Weight: 93.9 kg 92.7 kg 92.6 kg    Examination:  General: No acute distress. Cardiovascular: Heart sounds show a regular rate, and rhythm. Dressing to pacemaker pocket site c/d/i.  Lungs: Clear to auscultation bilaterally  Abdomen: Soft, nontender, nondistended Neurological: Alert and oriented 3. Moves all extremities 4. Cranial nerves II through XII grossly intact. Skin: Warm and dry. No rashes or lesions. Extremities: No clubbing or cyanosis. No edema. Psychiatric: Mood and affect are normal. Insight and judgment are appropriate.  Data Reviewed: I have personally reviewed following labs and imaging studies  CBC: Recent Labs  Lab 10/24/18 0207 10/25/18 0430 10/26/18 0707 10/28/18 0540 10/29/18 0500  WBC 24.1* 19.8* 9.6 8.0 9.0  NEUTROABS  --   --   --  3.6  --   HGB 14.4 15.2 16.0 16.2 16.5  HCT 41.7 44.1 46.8 48.1 48.6  MCV 91.6 90.4 91.6 91.3 91.9  PLT 158  161 177 221 259   Basic Metabolic Panel: Recent Labs  Lab 10/23/18 0206 10/24/18 0207 10/25/18 0430 10/28/18 0540 10/29/18 0500  NA 141 139 142 141 142  K 4.3 3.9 4.1 3.8 4.1  CL 111 108 110 106 108  CO2 18* 22 25 24 26   GLUCOSE 81 99 108* 110* 110*  BUN 26* 23 18 15 15   CREATININE 1.93* 1.31* 1.17 1.02 1.16  CALCIUM 9.3 9.0 9.6 9.4 9.7  MG  --  1.4* 1.9  --  1.9   GFR: Estimated Creatinine Clearance: 57.6 mL/min (by C-G formula based on SCr of 1.16 mg/dL). Liver Function Tests: Recent Labs  Lab 10/24/18 0207 10/25/18 0430 10/29/18 0500  AST 34 25 24  ALT 31 29 39  ALKPHOS 43 53 53  BILITOT 1.0 1.0 0.7  PROT 5.3* 5.7* 6.4*  ALBUMIN 2.6* 2.8* 3.1*   No results for input(s): LIPASE, AMYLASE in the last 168 hours. No results for input(s): AMMONIA in the last 168 hours. Coagulation Profile: No results for input(s): INR, PROTIME in the last 168 hours. Cardiac Enzymes: Recent Labs  Lab 10/22/18 2015 10/23/18 0206 10/23/18 0730  TROPONINI 0.26* 0.27* 0.20*   BNP (last 3 results) No results for input(s): PROBNP in the last 8760 hours. HbA1C: No results for input(s): HGBA1C in the last 72 hours. CBG: No results for input(s): GLUCAP in the last 168 hours. Lipid Profile: No results for input(s): CHOL, HDL, LDLCALC, TRIG, CHOLHDL, LDLDIRECT in the last 72 hours. Thyroid Function Tests: No results for input(s): TSH, T4TOTAL, FREET4, T3FREE, THYROIDAB in the last 72 hours. Anemia Panel: No results for input(s): VITAMINB12, FOLATE, FERRITIN, TIBC, IRON, RETICCTPCT in the last 72 hours. Sepsis Labs: Recent Labs  Lab 10/23/18 2046 10/23/18 2305 10/24/18 0207 10/24/18 0435  LATICACIDVEN 3.1* 3.2* 2.0* 1.8    Recent Results (from the past 240 hour(s))  SARS Coronavirus 2 (CEPHEID- Performed in [facility] lab), Hosp Order     Status: None   Collection Time: 10/22/18 10:47 AM  Result Value Ref Range Status   SARS Coronavirus 2 NEGATIVE NEGATIVE Final     Comment: (NOTE) If result is NEGATIVE SARS-CoV-2 target nucleic acids are NOT DETECTED. The SARS-CoV-2 RNA is generally detectable in upper and lower  respiratory specimens during the acute phase of infection. The lowest  concentration of SARS-CoV-2 viral copies this assay can detect is 250  copies / mL. A negative  result does not preclude SARS-CoV-2 infection  and should not be used as the sole basis for treatment or other  patient management decisions.  A negative result may occur with  improper specimen collection / handling, submission of specimen other  than nasopharyngeal swab, presence of viral mutation(s) within the  areas targeted by this assay, and inadequate number of viral copies  (<250 copies / mL). A negative result must be combined with clinical  observations, patient history, and epidemiological information. If result is POSITIVE SARS-CoV-2 target nucleic acids are DETECTED. The SARS-CoV-2 RNA is generally detectable in upper and lower  respiratory specimens dur ing the acute phase of infection.  Positive  results are indicative of active infection with SARS-CoV-2.  Clinical  correlation with patient history and other diagnostic information is  necessary to determine patient infection status.  Positive results do  not rule out bacterial infection or co-infection with other viruses. If result is PRESUMPTIVE POSTIVE SARS-CoV-2 nucleic acids MAY BE PRESENT.   A presumptive positive result was obtained on the submitted specimen  and confirmed on repeat testing.  While 2019 novel coronavirus  (SARS-CoV-2) nucleic acids may be present in the submitted sample  additional confirmatory testing may be necessary for epidemiological  and / or clinical management purposes  to differentiate between  SARS-CoV-2 and other Sarbecovirus currently known to infect humans.  If clinically indicated additional testing with an alternate test  methodology [phone]) is advised. The SARS-CoV-2  RNA is generally  detectable in upper and lower respiratory sp ecimens during the acute  phase of infection. The expected result is Negative. Fact Sheet for Patients:  StrictlyIdeas.no Fact Sheet for Healthcare Providers: BankingDealers.co.za This test is not yet approved or cleared by the [place] FDA and has been authorized for detection and/or diagnosis of SARS-CoV-2 by FDA under an Emergency Use Authorization (EUA).  This EUA will remain in effect (meaning this test can be used) for the duration of the COVID-19 declaration under Section 564(b)(1) of the Act, 21 U.S.C. section 360bbb-3(b)(1), unless the authorization is terminated or revoked sooner. Performed at [facility], [place] [address], [place], [place] [address]   Blood culture (routine x 2)     Status: Abnormal   Collection Time: 10/22/18 10:52 AM  Result Value Ref Range Status   Specimen Description BLOOD BLOOD LEFT FOREARM  Final   Special Requests   Final    BOTTLES DRAWN AEROBIC AND ANAEROBIC Blood Culture adequate volume   Culture  Setup Time   Final    IN BOTH AEROBIC AND ANAEROBIC BOTTLES GRAM POSITIVE COCCI CRITICAL RESULT CALLED TO, READ BACK BY AND VERIFIED WITH: J ORIET PHARMD 10/22/18 2300 JDW    Culture (A)  Final    STREPTOCOCCUS GALLOLYTICUS SUSCEPTIBILITIES PERFORMED ON PREVIOUS CULTURE WITHIN THE LAST 5 DAYS. Performed at [facility], [place] [address] [place], [place] [address]    Report Status 10/24/2018 FINAL  Final  Blood Culture ID Panel (Reflexed)     Status: Abnormal   Collection Time: 10/22/18 10:52 AM  Result Value Ref Range Status   Enterococcus species NOT DETECTED NOT DETECTED Final   Listeria monocytogenes NOT DETECTED NOT DETECTED Final   Staphylococcus species NOT DETECTED NOT DETECTED Final   Staphylococcus aureus (BCID) NOT DETECTED NOT DETECTED Final   Streptococcus species DETECTED (A) NOT DETECTED Final    Comment:  Not Enterococcus species, Streptococcus agalactiae, Streptococcus pyogenes, or Streptococcus pneumoniae. CRITICAL RESULT CALLED TO, READ BACK BY AND VERIFIED  WITH: Derry Lory [facility] 10/22/18 2300 JDW    Streptococcus agalactiae NOT DETECTED NOT DETECTED Final   Streptococcus pneumoniae NOT DETECTED NOT DETECTED Final   Streptococcus pyogenes NOT DETECTED NOT DETECTED Final   Acinetobacter baumannii NOT DETECTED NOT DETECTED Final   Enterobacteriaceae species NOT DETECTED NOT DETECTED Final   Enterobacter cloacae complex NOT DETECTED NOT DETECTED Final   Escherichia coli NOT DETECTED NOT DETECTED Final   Klebsiella oxytoca NOT DETECTED NOT DETECTED Final   Klebsiella pneumoniae NOT DETECTED NOT DETECTED Final   Proteus species NOT DETECTED NOT DETECTED Final   Serratia marcescens NOT DETECTED NOT DETECTED Final   Haemophilus influenzae NOT DETECTED NOT DETECTED Final   Neisseria meningitidis NOT DETECTED NOT DETECTED Final   Pseudomonas aeruginosa NOT DETECTED NOT DETECTED Final   Candida albicans NOT DETECTED NOT DETECTED Final   Candida glabrata NOT DETECTED NOT DETECTED Final   Candida krusei NOT DETECTED NOT DETECTED Final   Candida parapsilosis NOT DETECTED NOT DETECTED Final   Candida tropicalis NOT DETECTED NOT DETECTED Final    Comment: Performed at [facility], [place] [address] [place], [place] [address]  Urine culture     Status: Abnormal   Collection Time: 10/22/18 11:22 AM  Result Value Ref Range Status   Specimen Description URINE, RANDOM  Final   Special Requests NONE  Final   Culture (A)  Final    <10,000 COLONIES/mL INSIGNIFICANT GROWTH Performed at [place] [address], [place], [place] [address]    Report Status 10/23/2018 FINAL  Final  Blood culture (routine x 2)     Status: Abnormal   Collection Time: 10/22/18 11:23 AM  Result Value Ref Range Status   Specimen Description BLOOD RIGHT ANTECUBITAL  Final   Special Requests   Final    BOTTLES  DRAWN AEROBIC AND ANAEROBIC Blood Culture adequate volume   Culture  Setup Time   Final    IN BOTH AEROBIC AND ANAEROBIC BOTTLES GRAM POSITIVE COCCI CRITICAL VALUE NOTED.  VALUE IS CONSISTENT WITH PREVIOUSLY REPORTED AND CALLED VALUE. Performed at [facility], [place] [address], [place], [place] [address]    Culture STREPTOCOCCUS GALLOLYTICUS (A)  Final   Report Status 10/24/2018 FINAL  Final   Organism ID, Bacteria STREPTOCOCCUS GALLOLYTICUS  Final      Susceptibility   Streptococcus gallolyticus - MIC*    PENICILLIN 0.12 SENSITIVE Sensitive     CEFTRIAXONE <=0.12 SENSITIVE Sensitive     ERYTHROMYCIN <=0.12 SENSITIVE Sensitive     LEVOFLOXACIN 2 SENSITIVE Sensitive     VANCOMYCIN <=0.12 SENSITIVE Sensitive     * STREPTOCOCCUS GALLOLYTICUS  Respiratory Panel by PCR     Status: None   Collection Time: 10/22/18  5:44 PM  Result Value Ref Range Status   Adenovirus NOT DETECTED NOT DETECTED Final   Coronavirus 229E NOT DETECTED NOT DETECTED Final    Comment: (NOTE) The Coronavirus on the Respiratory Panel, DOES NOT test for the novel  Coronavirus (2019 nCoV)    Coronavirus HKU1 NOT DETECTED NOT DETECTED Final   Coronavirus NL63 NOT DETECTED NOT DETECTED Final   Coronavirus OC43 NOT DETECTED NOT DETECTED Final   Metapneumovirus NOT DETECTED NOT DETECTED Final   Rhinovirus / Enterovirus NOT DETECTED NOT DETECTED Final   Influenza A NOT DETECTED NOT DETECTED Final   Influenza B NOT DETECTED NOT DETECTED Final   Parainfluenza Virus 1 NOT DETECTED NOT DETECTED Final   Parainfluenza Virus 2 NOT DETECTED NOT DETECTED  Final   Parainfluenza Virus 3 NOT DETECTED NOT DETECTED Final   Parainfluenza Virus 4 NOT DETECTED NOT DETECTED Final   Respiratory Syncytial Virus NOT DETECTED NOT DETECTED Final   Bordetella pertussis NOT DETECTED NOT DETECTED Final   Chlamydophila pneumoniae NOT DETECTED NOT DETECTED Final   Mycoplasma pneumoniae NOT DETECTED NOT DETECTED Final    Comment: Performed  at [facility], [place] [address] [place], [place] [address]  SARS Coronavirus 2 (CEPHEID- Performed in [facility] lab), Hosp Order     Status: None   Collection Time: 10/22/18  5:44 PM  Result Value Ref Range Status   SARS Coronavirus 2 NEGATIVE NEGATIVE Final    Comment: (NOTE) If result is NEGATIVE SARS-CoV-2 target nucleic acids are NOT DETECTED. The SARS-CoV-2 RNA is generally detectable in upper and lower  respiratory specimens during the acute phase of infection. The lowest  concentration of SARS-CoV-2 viral copies this assay can detect is 250  copies / mL. A negative result does not preclude SARS-CoV-2 infection  and should not be used as the sole basis for treatment or other  patient management decisions.  A negative result may occur with  improper specimen collection / handling, submission of specimen other  than nasopharyngeal swab, presence of viral mutation(s) within the  areas targeted by this assay, and inadequate number of viral copies  (<250 copies / mL). A negative result must be combined with clinical  observations, patient history, and epidemiological information. If result is POSITIVE SARS-CoV-2 target nucleic acids are DETECTED. The SARS-CoV-2 RNA is generally detectable in upper and lower  respiratory specimens dur ing the acute phase of infection.  Positive  results are indicative of active infection with SARS-CoV-2.  Clinical  correlation with patient history and other diagnostic information is  necessary to determine patient infection status.  Positive results do  not rule out bacterial infection or co-infection with other viruses. If result is PRESUMPTIVE POSTIVE SARS-CoV-2 nucleic acids MAY BE PRESENT.   A presumptive positive result was obtained on the submitted specimen  and confirmed on repeat testing.  While 2019 novel coronavirus  (SARS-CoV-2) nucleic acids may be present in the submitted sample  additional confirmatory testing may be  necessary for epidemiological  and / or clinical management purposes  to differentiate between  SARS-CoV-2 and other Sarbecovirus currently known to infect humans.  If clinically indicated additional testing with an alternate test  methodology [phone]) is advised. The SARS-CoV-2 RNA is generally  detectable in upper and lower respiratory sp ecimens during the acute  phase of infection. The expected result is Negative. Fact Sheet for Patients:  StrictlyIdeas.no Fact Sheet for Healthcare Providers: BankingDealers.co.za This test is not yet approved or cleared by the [place] FDA and has been authorized for detection and/or diagnosis of SARS-CoV-2 by FDA under an Emergency Use Authorization (EUA).  This EUA will remain in effect (meaning this test can be used) for the duration of the COVID-19 declaration under Section 564(b)(1) of the Act, 21 U.S.C. section 360bbb-3(b)(1), unless the authorization is terminated or revoked sooner. Performed at [facility], [place] [address], [place], [place] [address]   Urine culture     Status: None   Collection Time: 10/22/18  8:29 PM  Result Value Ref Range Status   Specimen Description URINE, RANDOM  Final   Special Requests NONE  Final   Culture   Final    NO GROWTH Performed at [facility], [place]  [address] [place], [place] [address]    Report Status 10/24/2018 FINAL  Final  Culture, blood (routine x 2)     Status: None   Collection Time: 10/23/18  7:45 AM  Result Value Ref Range Status   Specimen Description BLOOD LEFT ANTECUBITAL  Final   Special Requests   Final    BOTTLES DRAWN AEROBIC ONLY Blood Culture adequate volume   Culture   Final    NO GROWTH 5 DAYS Performed at [facility], [place] [address], [place], [place] [address]    Report Status 10/28/2018 FINAL  Final  Culture, blood (routine x 2)     Status: None   Collection Time: 10/23/18  7:53 AM  Result Value Ref  Range Status   Specimen Description BLOOD RIGHT HAND  Final   Special Requests   Final    BOTTLES DRAWN AEROBIC ONLY Blood Culture adequate volume   Culture   Final    NO GROWTH 5 DAYS Performed at [facility], [place] [address] [place], [place] [address]    Report Status 10/28/2018 FINAL  Final  Culture, blood (routine x 2)     Status: None (Preliminary result)   Collection Time: 10/26/18  7:07 AM  Result Value Ref Range Status   Specimen Description BLOOD LEFT ANTECUBITAL  Final   Special Requests   Final    BOTTLES DRAWN AEROBIC AND ANAEROBIC Blood Culture adequate volume   Culture   Final    NO GROWTH 3 DAYS Performed at [facility], [place] [address], [place], [place] [address]    Report Status PENDING  Incomplete  Culture, blood (routine x 2)     Status: None (Preliminary result)   Collection Time: 10/26/18  7:21 AM  Result Value Ref Range Status   Specimen Description BLOOD BLOOD LEFT HAND  Final   Special Requests   Final    BOTTLES DRAWN AEROBIC AND ANAEROBIC Blood Culture adequate volume   Culture   Final    NO GROWTH 3 DAYS Performed at [facility], [place] [address] [place], [place] [address]    Report Status PENDING  Incomplete  Culture, Urine     Status: None   Collection Time: 10/28/18 11:03 AM  Result Value Ref Range Status   Specimen Description URINE, RANDOM  Final   Special Requests NONE  Final   Culture   Final    NO GROWTH Performed at [facility], [place] [address] [place], [place] [address]    Report Status 10/29/2018 FINAL  Final         Radiology Studies: [place] Ekg Site Rite  Result Date: 10/28/2018 If Site Rite image not attached, placement could not be confirmed due to current cardiac rhythm.       Scheduled Meds: . anastrozole  1 mg Oral Once per day on Mon Thu  . aspirin EC  81 mg Oral Daily  . clopidogrel  75 mg Oral Daily  . ezetimibe  10 mg Oral Daily  . fluticasone  2 spray Each Nare Daily  . pantoprazole   40 mg Oral Daily  . pravastatin  40 mg Oral q1800  . sodium chloride flush  3 mL Intravenous Q12H  . tamsulosin  0.4 mg Oral QPC breakfast  . thyroid  120 mg Oral QAC breakfast  . thyroid  60 mg Oral QPM   Continuous Infusions: . cefTRIAXone (ROCEPHIN)  IV 2 g (10/28/18 1628)     LOS:  7 days    Time spent: over 30 min    Fayrene Helper, MD [facility] Pager AMION  If 7PM-7AM, please contact night-coverage [email or web address] [facility] 10/29/2018, 2:46 PM

## 2018-10-29 NOTE — Progress Notes (Signed)
IDs notes reviewed.  They feel ok to proceed with new pacemaker implant.  I will place on schedule tentatively for device placement tomorrow.  NPO after midnight tonight  Thompson Grayer MD, [facility] 10/29/2018

## 2018-10-29 NOTE — Progress Notes (Signed)
Patient ID: [name] Glass, male   DOB: Mar 20, 1941, 78 y.o.   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  I suspect that he also had a early lumbar infection.  All recent blood cultures are negative.  I feel it is safe to have a new, permanent pacemaker placed now.  He is scheduled for left arm PICC placement this afternoon.   The cause of his dysuria is unclear.  He had a normal urinalysis and his urine culture is negative.  PLAN: 1. Continue ceftriaxone for 22 more days 2. PICC line placement 3. I will arrange follow-up in my clinic and sign off now  Diagnosis: Pacemaker pocket infection with bacteremia  Culture Result: Strep gallolyticus  Allergies  Allergen Reactions  . Morphine And Related     Had "crazy dreams" felt "crazy" per pt.  . Bactrim [Sulfamethoxazole-Trimethoprim]   . Statins     OPAT Orders Discharge antibiotics: Per pharmacy protocol ceftriaxone  Duration: 4 weeks End Date: 11/20/2018  [facility] Per Protocol:  Labs weekly while on IV antibiotics: _x_ CBC with differential _x_ BMP __ CMP __ CRP __ ESR __ Vancomycin trough __ CK  __ Please pull PIC at completion of IV antibiotics _x_ Please leave PIC in place until doctor has seen patient or been notified  Fax weekly labs to [phone]  Clinic Follow Up Appt: 11/20/2018  Continue  Problem:   Streptococcal Gallolyticus Bacteremia Active Problems:   Presence of permanent cardiac pacemaker   Acute low back pain   Chronic bronchitis (HCC)   Coronary artery disease   OA (osteoarthritis) of knee   Gastroesophageal reflux disease without esophagitis   Essential hypertension, benign   Hyperlipidemia with target LDL less than 70   Chronic renal disease, stage 2, mildly  decreased glomerular filtration rate (GFR) between 60-89 mL/min/1.73 square meter   Sepsis ([place])   H/O splenectomy   Sleep apnea   Scheduled Meds: . anastrozole  1 mg Oral Once per day on Mon Thu  . aspirin EC  81 mg Oral Daily  . ezetimibe  10 mg Oral Daily  . fluticasone  2 spray Each Nare Daily  . ipratropium-albuterol  3 mL Nebulization BID  . pantoprazole  40 mg Oral Daily  . pravastatin  40 mg Oral q1800  . sodium chloride flush  3 mL Intravenous Q12H  . tamsulosin  0.4 mg Oral QPC breakfast  . thyroid  120 mg Oral QAC breakfast  . thyroid  60 mg Oral QPM   Continuous Infusions: . cefTRIAXone (ROCEPHIN)  IV Stopped (10/27/18 1718)   PRN Meds:.acetaminophen, albuterol, HYDROmorphone (DILAUDID) injection, ondansetron **OR** ondansetron (ZOFRAN) IV, oxyCODONE   SUBJECTIVE: He is feeling much better but is still having some dysuria and urinary frequency.  His low back pain is much improved.  Review of Systems: Review of Systems  Constitutional: Negative for chills, diaphoresis and fever.  Genitourinary: Positive for dysuria and frequency.  Musculoskeletal: Positive for back pain.    Allergies  Allergen Reactions  . Morphine And Related     Had "crazy dreams" felt "crazy" per pt.  . Bactrim [Sulfamethoxazole-Trimethoprim]   . Statins     OBJECTIVE: Vitals:  10/28/18 0601 10/28/18 0603 10/28/18 0714 10/28/18 0838  BP: (!) 150/73   128/76  Pulse: 66   88  Resp: 18     Temp: 99 F (37.2 C)     TempSrc: Oral     SpO2: 94%  95%   Weight:  92.7 kg    Height:       Body mass index is 27.72 kg/m.  Physical Exam Constitutional:      Comments: He is in good spirits.  Cardiovascular:     Rate and Rhythm: Normal rate and regular rhythm.     Heart sounds: No murmur.  Pulmonary:     Effort: Pulmonary effort is normal.     Breath sounds: Normal breath sounds.  Chest:    Abdominal:     Palpations: Abdomen is soft.     Tenderness: There is no abdominal  tenderness.  Psychiatric:        Mood and Affect: Mood normal.     Lab Results Lab Results  Component Value Date   WBC 8.0 10/28/2018   HGB 16.2 10/28/2018   HCT 48.1 10/28/2018   MCV 91.3 10/28/2018   PLT 221 10/28/2018    Lab Results  Component Value Date   CREATININE 1.02 10/28/2018   BUN 15 10/28/2018   NA 141 10/28/2018   K 3.8 10/28/2018   CL 106 10/28/2018   CO2 24 10/28/2018    Lab Results  Component Value Date   ALT 29 10/25/2018   AST 25 10/25/2018   ALKPHOS 53 10/25/2018   BILITOT 1.0 10/25/2018     Microbiology: Recent Results (from the past 240 hour(s))  SARS Coronavirus 2 (CEPHEID- Performed in [place] hospital lab), Hosp Order     Status: None   Collection Time: 10/22/18 10:47 AM  Result Value Ref Range Status   SARS Coronavirus 2 NEGATIVE NEGATIVE Final    Comment: (NOTE) If result is NEGATIVE SARS-CoV-2 target nucleic acids are NOT DETECTED. The SARS-CoV-2 RNA is generally detectable in upper and lower  respiratory specimens during the acute phase of infection. The lowest  concentration of SARS-CoV-2 viral copies this assay can detect is 250  copies / mL. A negative result does not preclude SARS-CoV-2 infection  and should not be used as the sole basis for treatment or other  patient management decisions.  A negative result may occur with  improper specimen collection / handling, submission of specimen other  than nasopharyngeal swab, presence of viral mutation(s) within the  areas targeted by this assay, and inadequate number of viral copies  (<250 copies / mL). A negative result must be combined with clinical  observations, patient history, and epidemiological information. If result is POSITIVE SARS-CoV-2 target nucleic acids are DETECTED. The SARS-CoV-2 RNA is generally detectable in upper and lower  respiratory specimens dur ing the acute phase of infection.  Positive  results are indicative of active infection with SARS-CoV-2.   Clinical  correlation with patient history and other diagnostic information is  necessary to determine patient infection status.  Positive results do  not rule out bacterial infection or co-infection with other viruses. If result is PRESUMPTIVE POSTIVE SARS-CoV-2 nucleic acids MAY BE PRESENT.   A presumptive positive result was obtained on the submitted specimen  and confirmed on repeat testing.  While 2019 novel coronavirus  (SARS-CoV-2) nucleic acids may be present in the submitted sample  additional confirmatory testing may be necessary for epidemiological  and / or clinical management purposes  to differentiate  between  SARS-CoV-2 and other Sarbecovirus currently known to infect humans.  If clinically indicated additional testing with an alternate test  methodology [phone]) is advised. The SARS-CoV-2 RNA is generally  detectable in upper and lower respiratory sp ecimens during the acute  phase of infection. The expected result is Negative. Fact Sheet for Patients:  StrictlyIdeas.no Fact Sheet for Healthcare Providers: BankingDealers.co.za This test is not yet approved or cleared by the [place] FDA and has been authorized for detection and/or diagnosis of SARS-CoV-2 by FDA under an Emergency Use Authorization (EUA).  This EUA will remain in effect (meaning this test can be used) for the duration of the COVID-19 declaration under Section 564(b)(1) of the Act, 21 U.S.C. section 360bbb-3(b)(1), unless the authorization is terminated or revoked sooner. Performed at [facility], [place] [address] [place], [place] [address]   Blood culture (routine x 2)     Status: Abnormal   Collection Time: 10/22/18 10:52 AM  Result Value Ref Range Status   Specimen Description BLOOD BLOOD LEFT FOREARM  Final   Special Requests   Final    BOTTLES DRAWN AEROBIC AND ANAEROBIC Blood Culture adequate volume   Culture  Setup Time   Final    IN  BOTH AEROBIC AND ANAEROBIC BOTTLES GRAM POSITIVE COCCI CRITICAL RESULT CALLED TO, READ BACK BY AND VERIFIED WITH: J ORIET PHARMD 10/22/18 2300 JDW    Culture (A)  Final    STREPTOCOCCUS GALLOLYTICUS SUSCEPTIBILITIES PERFORMED ON PREVIOUS CULTURE WITHIN THE LAST 5 DAYS. Performed at [facility], [place] [address], [place], [place] [address]    Report Status 10/24/2018 FINAL  Final  Blood Culture ID Panel (Reflexed)     Status: Abnormal   Collection Time: 10/22/18 10:52 AM  Result Value Ref Range Status   Enterococcus species NOT DETECTED NOT DETECTED Final   Listeria monocytogenes NOT DETECTED NOT DETECTED Final   Staphylococcus species NOT DETECTED NOT DETECTED Final   Staphylococcus aureus (BCID) NOT DETECTED NOT DETECTED Final   Streptococcus species DETECTED (A) NOT DETECTED Final    Comment: Not Enterococcus species, Streptococcus agalactiae, Streptococcus pyogenes, or Streptococcus pneumoniae. CRITICAL RESULT CALLED TO, READ BACK BY AND VERIFIED WITH: J ORIET PHARMD 10/22/18 2300 JDW    Streptococcus agalactiae NOT DETECTED NOT DETECTED Final   Streptococcus pneumoniae NOT DETECTED NOT DETECTED Final   Streptococcus pyogenes NOT DETECTED NOT DETECTED Final   Acinetobacter baumannii NOT DETECTED NOT DETECTED Final   Enterobacteriaceae species NOT DETECTED NOT DETECTED Final   Enterobacter cloacae complex NOT DETECTED NOT DETECTED Final   Escherichia coli NOT DETECTED NOT DETECTED Final   Klebsiella oxytoca NOT DETECTED NOT DETECTED Final   Klebsiella pneumoniae NOT DETECTED NOT DETECTED Final   Proteus species NOT DETECTED NOT DETECTED Final   Serratia marcescens NOT DETECTED NOT DETECTED Final   Haemophilus influenzae NOT DETECTED NOT DETECTED Final   Neisseria meningitidis NOT DETECTED NOT DETECTED Final   Pseudomonas aeruginosa NOT DETECTED NOT DETECTED Final   Candida albicans NOT DETECTED NOT DETECTED Final   Candida glabrata NOT DETECTED NOT DETECTED Final   Candida  krusei NOT DETECTED NOT DETECTED Final   Candida parapsilosis NOT DETECTED NOT DETECTED Final   Candida tropicalis NOT DETECTED NOT DETECTED Final    Comment: Performed at [facility], [place] [address] [place], [place] [address]  Urine culture     Status: Abnormal   Collection Time: 10/22/18 11:22 AM  Result Value Ref Range Status   Specimen  Description URINE, RANDOM  Final   Special Requests NONE  Final   Culture (A)  Final    <10,000 COLONIES/mL INSIGNIFICANT GROWTH Performed at [facility], [place] [address], [place], [place] [address]    Report Status 10/23/2018 FINAL  Final  Blood culture (routine x 2)     Status: Abnormal   Collection Time: 10/22/18 11:23 AM  Result Value Ref Range Status   Specimen Description BLOOD RIGHT ANTECUBITAL  Final   Special Requests   Final    BOTTLES DRAWN AEROBIC AND ANAEROBIC Blood Culture adequate volume   Culture  Setup Time   Final    IN BOTH AEROBIC AND ANAEROBIC BOTTLES GRAM POSITIVE COCCI CRITICAL VALUE NOTED.  VALUE IS CONSISTENT WITH PREVIOUSLY REPORTED AND CALLED VALUE. Performed at [facility], [place] [address], [place], [place] [address]    Culture STREPTOCOCCUS GALLOLYTICUS (A)  Final   Report Status 10/24/2018 FINAL  Final   Organism ID, Bacteria STREPTOCOCCUS GALLOLYTICUS  Final      Susceptibility   Streptococcus gallolyticus - MIC*    PENICILLIN 0.12 SENSITIVE Sensitive     CEFTRIAXONE <=0.12 SENSITIVE Sensitive     ERYTHROMYCIN <=0.12 SENSITIVE Sensitive     LEVOFLOXACIN 2 SENSITIVE Sensitive     VANCOMYCIN <=0.12 SENSITIVE Sensitive     * STREPTOCOCCUS GALLOLYTICUS  Respiratory Panel by PCR     Status: None   Collection Time: 10/22/18  5:44 PM  Result Value Ref Range Status   Adenovirus NOT DETECTED NOT DETECTED Final   Coronavirus 229E NOT DETECTED NOT DETECTED Final    Comment: (NOTE) The Coronavirus on the Respiratory Panel, DOES NOT test for the novel  Coronavirus (2019 nCoV)    Coronavirus HKU1  NOT DETECTED NOT DETECTED Final   Coronavirus NL63 NOT DETECTED NOT DETECTED Final   Coronavirus OC43 NOT DETECTED NOT DETECTED Final   Metapneumovirus NOT DETECTED NOT DETECTED Final   Rhinovirus / Enterovirus NOT DETECTED NOT DETECTED Final   Influenza A NOT DETECTED NOT DETECTED Final   Influenza B NOT DETECTED NOT DETECTED Final   Parainfluenza Virus 1 NOT DETECTED NOT DETECTED Final   Parainfluenza Virus 2 NOT DETECTED NOT DETECTED Final   Parainfluenza Virus 3 NOT DETECTED NOT DETECTED Final   Parainfluenza Virus 4 NOT DETECTED NOT DETECTED Final   Respiratory Syncytial Virus NOT DETECTED NOT DETECTED Final   Bordetella pertussis NOT DETECTED NOT DETECTED Final   Chlamydophila pneumoniae NOT DETECTED NOT DETECTED Final   Mycoplasma pneumoniae NOT DETECTED NOT DETECTED Final    Comment: Performed at [facility], [place]. [address], [place], [place] [address]  SARS Coronavirus 2 (CEPHEID- Performed in [place] hospital lab), Hosp Order     Status: None   Collection Time: 10/22/18  5:44 PM  Result Value Ref Range Status   SARS Coronavirus 2 NEGATIVE NEGATIVE Final    Comment: (NOTE) If result is NEGATIVE SARS-CoV-2 target nucleic acids are NOT DETECTED. The SARS-CoV-2 RNA is generally detectable in upper and lower  respiratory specimens during the acute phase of infection. The lowest  concentration of SARS-CoV-2 viral copies this assay can detect is 250  copies / mL. A negative result does not preclude SARS-CoV-2 infection  and should not be used as the sole basis for treatment or other  patient management decisions.  A negative result may occur with  improper specimen collection / handling, submission of specimen other  than nasopharyngeal swab, presence of viral mutation(s) within the  areas targeted by this assay, and inadequate number of viral copies  (<250 copies / mL). A negative result must be combined with clinical  observations, patient history, and epidemiological  information. If result is POSITIVE SARS-CoV-2 target nucleic acids are DETECTED. The SARS-CoV-2 RNA is generally detectable in upper and lower  respiratory specimens dur ing the acute phase of infection.  Positive  results are indicative of active infection with SARS-CoV-2.  Clinical  correlation with patient history and other diagnostic information is  necessary to determine patient infection status.  Positive results do  not rule out bacterial infection or co-infection with other viruses. If result is PRESUMPTIVE POSTIVE SARS-CoV-2 nucleic acids MAY BE PRESENT.   A presumptive positive result was obtained on the submitted specimen  and confirmed on repeat testing.  While 2019 novel coronavirus  (SARS-CoV-2) nucleic acids may be present in the submitted sample  additional confirmatory testing may be necessary for epidemiological  and / or clinical management purposes  to differentiate between  SARS-CoV-2 and other Sarbecovirus currently known to infect humans.  If clinically indicated additional testing with an alternate test  methodology [phone]) is advised. The SARS-CoV-2 RNA is generally  detectable in upper and lower respiratory sp ecimens during the acute  phase of infection. The expected result is Negative. Fact Sheet for Patients:  StrictlyIdeas.no Fact Sheet for Healthcare Providers: BankingDealers.co.za This test is not yet approved or cleared by the [place] FDA and has been authorized for detection and/or diagnosis of SARS-CoV-2 by FDA under an Emergency Use Authorization (EUA).  This EUA will remain in effect (meaning this test can be used) for the duration of the COVID-19 declaration under Section 564(b)(1) of the Act, 21 U.S.C. section 360bbb-3(b)(1), unless the authorization is terminated or revoked sooner. Performed at [facility], [place] [address] [place], [place] [address]   Urine culture     Status:  None   Collection Time: 10/22/18  8:29 PM  Result Value Ref Range Status   Specimen Description URINE, RANDOM  Final   Special Requests NONE  Final   Culture   Final    NO GROWTH Performed at [facility], [place] [address] [place], [place] [address]    Report Status 10/24/2018 FINAL  Final  Culture, blood (routine x 2)     Status: None   Collection Time: 10/23/18  7:45 AM  Result Value Ref Range Status   Specimen Description BLOOD LEFT ANTECUBITAL  Final   Special Requests   Final    BOTTLES DRAWN AEROBIC ONLY Blood Culture adequate volume   Culture   Final    NO GROWTH 5 DAYS Performed at [facility], [place] [address] [place], [place] [address]    Report Status 10/28/2018 FINAL  Final  Culture, blood (routine x 2)     Status: None   Collection Time: 10/23/18  7:53 AM  Result Value Ref Range Status   Specimen Description BLOOD RIGHT HAND  Final   Special Requests   Final    BOTTLES DRAWN AEROBIC ONLY Blood Culture adequate volume   Culture   Final    NO GROWTH 5 DAYS Performed at [facility], [place] [address] [place],  [address]    Report Status 10/28/2018 FINAL  Final  Culture, blood (routine x 2)     Status: None (Preliminary result)   Collection Time: 10/26/18  7:07 AM  Result Value Ref Range Status   Specimen Description BLOOD LEFT  ANTECUBITAL  Final   Special Requests   Final    BOTTLES DRAWN AEROBIC AND ANAEROBIC Blood Culture adequate volume   Culture   Final    NO GROWTH 2 DAYS Performed at [facility], [address] [place], [place] [address]    Report Status PENDING  Incomplete  Culture, blood (routine x 2)     Status: None (Preliminary result)   Collection Time: 10/26/18  7:21 AM  Result Value Ref Range Status   Specimen Description BLOOD BLOOD LEFT HAND  Final   Special Requests   Final    BOTTLES DRAWN AEROBIC AND ANAEROBIC Blood Culture adequate volume   Culture   Final    NO GROWTH 2 DAYS Performed at [facility], [place] [address] [place], [place] [address]    Report Status PENDING  Incomplete    Michel Bickers, MD [facility] [phone] pager   [phone] cell 10/28/2018, 10:26 AM

## 2018-10-29 NOTE — Progress Notes (Signed)
Peripherally Inserted Central Catheter/Midline Placement  The IV Nurse has discussed with the patient and/or persons authorized to consent for the patient, the purpose of this procedure and the potential benefits and risks involved with this procedure.  The benefits include less needle sticks, lab draws from the catheter, and the patient may be discharged home with the catheter. Risks include, but not limited to, infection, bleeding, blood clot (thrombus formation), and puncture of an artery; nerve damage and irregular heartbeat and possibility to perform a PICC exchange if needed/ordered by physician.  Alternatives to this procedure were also discussed.  Bard Power PICC patient education guide, fact sheet on infection prevention and patient information card has been provided to patient /or left at bedside.    PICC/Midline Placement Documentation  PICC Single Lumen 53/00/51 PICC Basilic 45 cm 0 cm (Active)  Indication for Insertion or Continuance of Line Home intravenous therapies (PICC only) 10/29/2018  3:42 PM  Exposed Catheter (cm) 0 cm 10/29/2018  3:42 PM  Site Assessment Clean;Dry;Intact 10/29/2018  3:42 PM  Line Status Flushed;Blood return noted;Saline locked 10/29/2018  3:42 PM  Dressing Type Transparent 10/29/2018  3:42 PM  Dressing Status Clean;Dry;Intact;Antimicrobial disc in place 10/29/2018  3:42 PM  Dressing Change Due 11/05/18 10/29/2018  3:42 PM       [address] 10/29/2018, 3:44 PM

## 2018-10-29 NOTE — Progress Notes (Signed)
Spoke to [employer], said pacemaker will be placed tomorrow or Thursday, he will not be discharged today. Advised PICC will be placed later today or tomorrow. [employer]

## 2018-10-29 NOTE — Care Management Important Message (Signed)
Important Message  Patient Details  Name: [name] Glass MRN: [id number] Date of Birth: 1941-01-23   Medicare Important Message Given:  Yes    [name] 10/29/2018, 2:59 PM

## 2018-10-29 NOTE — Progress Notes (Signed)
Progress Note   Subjective   Doing well today, the patient denies CP or SOB.  + dysuria (slightly improved) though UA was unrevealing.  Tmax last night was 99.4  Inpatient Medications    Scheduled Meds: . anastrozole  1 mg Oral Once per day on Mon Thu  . aspirin EC  81 mg Oral Daily  . ezetimibe  10 mg Oral Daily  . fluticasone  2 spray Each Nare Daily  . pantoprazole  40 mg Oral Daily  . pravastatin  40 mg Oral q1800  . sodium chloride flush  3 mL Intravenous Q12H  . tamsulosin  0.4 mg Oral QPC breakfast  . thyroid  120 mg Oral QAC breakfast  . thyroid  60 mg Oral QPM   Continuous Infusions: . cefTRIAXone (ROCEPHIN)  IV 2 g (10/28/18 1628)   PRN Meds: acetaminophen, albuterol, HYDROmorphone (DILAUDID) injection, ondansetron **OR** ondansetron (ZOFRAN) IV, oxyCODONE   Vital Signs    Vitals:   10/28/18 1154 10/28/18 1917 10/28/18 1928 10/29/18 0507  BP: 130/70 (!) 144/75  135/78  Pulse: (!) 48 (!) 51  62  Resp: 18 18  18   Temp: 97.7 F (36.5 C) 99.4 F (37.4 C)  98 F (36.7 C)  TempSrc: Oral Oral    SpO2: 94% 95% 94% 92%  Weight:    92.6 kg  Height:        Intake/Output Summary (Last 24 hours) at 10/29/2018 0801 Last data filed at 10/29/2018 0207 Gross per 24 hour  Intake 1063 ml  Output 1650 ml  Net -587 ml   Filed Weights   10/27/18 0433 10/28/18 0603 10/29/18 0507  Weight: 93.9 kg 92.7 kg 92.6 kg    Telemetry    Sinus bradycardia with intermittent V pacing - Personally Reviewed  Physical Exam   GEN- The patient is well appearing, alert and oriented x 3 today.   Head- normocephalic, atraumatic Eyes-  Sclera clear, conjunctiva pink Ears- hearing intact Oropharynx- clear Neck- supple, Lungs-  normal work of breathing Heart- Regular rate and rhythm  GI- soft, NT, ND, + BS Extremities- no clubbing, cyanosis, or edema  Skin- L chest wound examined today.  Prolene sutures in place.    No warmth, tenderness.  Temp/ perm device appears stable    Labs    Chemistry Recent Labs  Lab 10/24/18 0207 10/25/18 0430 10/28/18 0540 10/29/18 0500  NA 139 142 141 142  K 3.9 4.1 3.8 4.1  CL 108 110 106 108  CO2 22 25 24 26   GLUCOSE 99 108* 110* 110*  BUN 23 18 15 15   CREATININE 1.31* 1.17 1.02 1.16  CALCIUM 9.0 9.6 9.4 9.7  PROT 5.3* 5.7*  --  6.4*  ALBUMIN 2.6* 2.8*  --  3.1*  AST 34 25  --  24  ALT 31 29  --  39  ALKPHOS 43 53  --  53  BILITOT 1.0 1.0  --  0.7  GFRNONAA 52* 59* >60 60*  GFRAA >60 >60 >60 >60  ANIONGAP 9 7 11 8      Hematology Recent Labs  Lab 10/26/18 0707 10/28/18 0540 10/29/18 0500  WBC 9.6 8.0 9.0  RBC 5.11 5.27 5.29  HGB 16.0 16.2 16.5  HCT 46.8 48.1 48.6  MCV 91.6 91.3 91.9  MCH 31.3 30.7 31.2  MCHC 34.2 33.7 34.0  RDW 18.6* 18.6* 18.7*  PLT 177 221 267    Cardiac Enzymes Recent Labs  Lab 10/22/18 1050 10/22/18 2015 10/23/18 0206 10/23/18 0730  TROPONINI 0.07* 0.26* 0.27* 0.20*   No results for input(s): TROPIPOC in the last 168 hours.      Assessment & Plan    1.  Strep bacteremia Doing well s/p pacing system removal  Receiving IV antibiotics as per ID. Consider pacemaker replacement mid to late week.  As he continues to have low grade temp elevations at night (99.4 overnight, 100.2 night prior), I feel that we should continue watchful waiting at this present time.  Dr Curt Bears to see in am and determine next steps with reimplantation depending on temperature curves.    Thompson Grayer MD, [facility] 10/29/2018 8:01 AM

## 2018-10-29 NOTE — Progress Notes (Signed)
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 To discharging provider:  please sign these orders via discharge navigator,  Select New Orders & click on the button choice - Manage This Unsigned Work.     Thank you for allowing pharmacy to be a part of this patient's care.  Jimmy Footman, PharmD, BCPS, BCIDP Infectious Diseases Clinical Pharmacist Phone: [phone]  10/29/2018, 12:11 PM

## 2018-10-30 ENCOUNTER — Encounter (HOSPITAL_COMMUNITY): Payer: Self-pay | Admitting: Cardiology

## 2018-10-30 ENCOUNTER — Inpatient Hospital Stay (HOSPITAL_COMMUNITY)
Admission: EM | Disposition: A | Discharge status: Home Health | Payer: Self-pay | Source: Home / Self Care | Attending: Family Medicine

## 2018-10-30 HISTORY — PX: PACEMAKER IMPLANT: EP1218

## 2018-10-30 LAB — COMPREHENSIVE METABOLIC PANEL
ALT: 33 U/L (ref 0–44)
AST: 19 U/L (ref 15–41)
Albumin: 3.1 g/dL — ABNORMAL LOW (ref 3.5–5.0)
Alkaline Phosphatase: 55 U/L (ref 38–126)
Anion gap: 11 (ref 5–15)
BUN: 15 mg/dL (ref 8–23)
CO2: 24 mmol/L (ref 22–32)
Calcium: 9.9 mg/dL (ref 8.9–10.3)
Chloride: 106 mmol/L (ref 98–111)
Creatinine, Ser: 1.04 mg/dL (ref 0.61–1.24)
GFR calc Af Amer: >60 mL/min (ref >60)
GFR calc non Af Amer: >60 mL/min (ref >60)
Glucose, Bld: 110 mg/dL — ABNORMAL HIGH (ref 70–99)
Potassium: 4.2 mmol/L (ref 3.5–5.1)
Sodium: 141 mmol/L (ref 135–145)
Total Bilirubin: 0.6 mg/dL (ref 0.3–1.2)
Total Protein: 6.4 g/dL — ABNORMAL LOW (ref 6.5–8.1)

## 2018-10-30 LAB — CBC
HCT: 48.9 % (ref 39.0–52.0)
Hemoglobin: 16.8 g/dL (ref 13.0–17.0)
MCH: 31.3 pg (ref 26.0–34.0)
MCHC: 34.4 g/dL (ref 30.0–36.0)
MCV: 91.1 fL (ref 80.0–100.0)
Platelets: 278 10*3/uL (ref 150–400)
RBC: 5.37 MIL/uL (ref 4.22–5.81)
RDW: 18.5 % — ABNORMAL HIGH (ref 11.5–15.5)
WBC: 8.8 10*3/uL (ref 4.0–10.5)
nRBC: 0 % (ref 0.0–0.2)

## 2018-10-30 LAB — MAGNESIUM: Magnesium: 1.9 mg/dL (ref 1.7–2.4)

## 2018-10-30 SURGERY — PACEMAKER IMPLANT

## 2018-10-30 MED ORDER — LIDOCAINE HCL (PF) 1 % IJ SOLN
INTRAMUSCULAR | Status: DC | PRN
  Administered 2018-10-30: 70 mL

## 2018-10-30 MED ORDER — LIDOCAINE HCL 1 % IJ SOLN
INTRAMUSCULAR | Status: AC
  Filled 2018-10-30: qty 20

## 2018-10-30 MED ORDER — MIDAZOLAM HCL 5 MG/5ML IJ SOLN
INTRAMUSCULAR | Status: AC
  Filled 2018-10-30: qty 5

## 2018-10-30 MED ORDER — FENTANYL CITRATE (PF) 100 MCG/2ML IJ SOLN
INTRAMUSCULAR | Status: AC
  Filled 2018-10-30: qty 2

## 2018-10-30 MED ORDER — HEPARIN (PORCINE) IN NACL 1000-0.9 UT/500ML-% IV SOLN
INTRAVENOUS | Status: AC
  Filled 2018-10-30: qty 500

## 2018-10-30 MED ORDER — HEPARIN (PORCINE) IN NACL 1000-0.9 UT/500ML-% IV SOLN
INTRAVENOUS | Status: DC | PRN
  Administered 2018-10-30: 500 mL

## 2018-10-30 MED ORDER — SODIUM CHLORIDE 0.9 % IV SOLN
INTRAVENOUS | Status: AC
  Filled 2018-10-30: qty 2

## 2018-10-30 MED ORDER — CEFAZOLIN SODIUM-DEXTROSE 1-4 GM/50ML-% IV SOLN
1.0000 g | Freq: Four times a day (QID) | INTRAVENOUS | Status: AC
  Administered 2018-10-30 – 2018-10-31 (×3): 1 g via INTRAVENOUS
  Filled 2018-10-30 (×3): qty 50

## 2018-10-30 MED ORDER — CEFAZOLIN SODIUM-DEXTROSE 2-4 GM/100ML-% IV SOLN
INTRAVENOUS | Status: AC
  Filled 2018-10-30: qty 100

## 2018-10-30 MED ORDER — FENTANYL CITRATE (PF) 100 MCG/2ML IJ SOLN
INTRAMUSCULAR | Status: DC | PRN
  Administered 2018-10-30: 12.5 ug via INTRAVENOUS
  Administered 2018-10-30 (×3): 25 ug via INTRAVENOUS

## 2018-10-30 MED ORDER — MIDAZOLAM HCL 5 MG/5ML IJ SOLN
INTRAMUSCULAR | Status: DC | PRN
  Administered 2018-10-30 (×4): 1 mg via INTRAVENOUS

## 2018-10-30 SURGICAL SUPPLY — 8 items
CABLE SURGICAL S-101-97-12 (CABLE) ×3 IMPLANT
IPG PACE AZUR XT DR MRI W1DR01 (Pacemaker) IMPLANT
LEAD CAPSURE NOVUS 5076-52CM (Lead) ×2 IMPLANT
LEAD CAPSURE NOVUS 5076-58CM (Lead) ×2 IMPLANT
PACE AZURE XT DR MRI W1DR01 (Pacemaker) ×3 IMPLANT
PAD PRO RADIOLUCENT 2001M-C (PAD) ×3 IMPLANT
SHEATH CLASSIC 7F (SHEATH) ×4 IMPLANT
TRAY PACEMAKER INSERTION (PACKS) ×3 IMPLANT

## 2018-10-30 NOTE — Progress Notes (Signed)
HIBIclens applied last night and this AM. Left chest shaved. Consent on chart,NPO since midnight.

## 2018-10-30 NOTE — Progress Notes (Signed)
Patient transferred to stretcher to be taken down to surgery for pacemaker implantation report was given to [place]. Vs stable no distress noted.

## 2018-10-30 NOTE — Progress Notes (Signed)
[name] Glass  PROGRESS NOTE    [name] Glass  OHY:[id number] DOB: [birth year] DOA: 10/22/2018 PCP: [name], MD   Brief Narrative:   [name] Glass a 78 y.o.malewith history h/ohyperlipidemia, hypertension, CAD status post PTCA x 6, with last stent in January, GERD, arthritis, hiatal hernia, sleep apnea presents to the ED today with complaints of productive cough and dyspnea. Patient states he has had on and off symptoms of bronchitis and received at least 2 courses of antibiotics within the last 3 months. On presentation to the ED he was noted to be febrile, tachycardic, dyspneic and hypoxic requiring 2 L of nasal cannula.Labs revealed elevated lactate at 4, sepsis protocol was initiated. Patient received IV fluids broad-spectrum antibiotics and underwent COVID-19 work-up as well as CT chest to rule out PE/pneumonia which were all unremarkable. Patient lives with his wife and denies any sick contacts. Skin redness noted along his trunk and face which he relates to sunburn.He denies any chest pain or leg swellings or headache or dizziness. He does report low back pain and points to right iliac/paraspinal area. He does have a history of kidney stones but does not feel this pain is like prior episodes and denies any dysuria/hematuria. He drinks scotch daily but denies any history of alcohol withdrawal.  Patient does have long history of CAD and had 5 stents placed more than 10 years back by Dr [name]. He reports undergoing a stress test in [place] at one point which was apparently good.He was in [facility] and had an episode of syncope.He ultimately was transported by EMS to [facility] where he underwent cardiac catheterizationfor NSTEMI revealinghigh-grade calcified LAD stenosis requiring DESimplantation in Jan 2020.He has remained on aspirin and Brilinta since then.He also was noted to have long pauses on telemetry with recurrent syncopeduring  that hospitalizationand andunderwentpermanent pacemaker implantation.When seen by Dr. Alvester Chou for follow-up on January 28, patient still had pocket staples in. He currently has tenderness at pacemaker site as well as a dry whitish scab (which was also noted at follow-up visit with cardiologist in March). Patient denies noticing any discharge.He did receive a course of abx (doxycycline) in January for cellulitis (apparently hadrt forearm cellulitis, rtknee injury from fall at [facility]) and Omnicef in March (for pharyngitis/bronchitis)  Admitted for strep bacteremia thought 2/2 pacemaker pocket infection and suspected early lumbar infection.  Now s/p pacemaker removal on 5/22.  At this point in time, awaiting replacement of pacemaker and PICC placement.  Will be ready for discharge after pacemaker replaced and pt gets PICC.   Assessment & Plan:   Principal Problem:   Streptococcal Gallolyticus Bacteremia Active Problems:   Coronary artery disease   OA (osteoarthritis) of knee   Gastroesophageal reflux disease without esophagitis   Essential hypertension, benign   Hyperlipidemia with target LDL less than 70   Chronic renal disease, stage 2, mildly decreased glomerular filtration rate (GFR) between 60-89 mL/min/1.73 square meter   Sepsis ([place])   H/O splenectomy   Presence of permanent cardiac pacemaker   Sleep apnea   Acute low back pain   Chronic bronchitis (HCC)   Resolving sepsis secondary to strep Gallolyticus bacteremiastatus post pacing system removal on 10/25/2018 Suspected Early Lumbar Infection     - Presented with fever with T-max of 102.5, tachycardia with heart rate 133, tachypnea with respiration rate 32      - Blood cultures drawn on 10/22/2018+ forstrep gallolyticus      - 5/20 and 5/23 repeat  cultures so for NGTD      - MRI spine with mild paraspinous edema involving R>L posterior paraspinous musculature (consider strain vs myositis). No other evidence for acute  infection within lumbar spine.      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 Will need to follow-up outpatient with GI -> will need elective colonoscopy as outpatient; Self-reported last colonoscopy was 3 years ago with [facility] [place]     - S/p TEE on 10/25/2018 no valvular vegetation or ICD vegetation. 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     - Post pacing system removal on 10/25/2018     - Continue ceftriaxone through 11/20/2018 per ID; follow up with them 11/20/2018     - PICC line for long-term IV antibiotics     - Infectious disease following, now signed off     - pacer to be replaced today; can likely go in AM  Resolving acute hypoxic respiratory failure     - Work-up unrevealing     - CTA PE negative     - Chest x-ray unremarkable     - Brilinta has been discontinued due to her shortness of breath, started on Plavix     - O2 has been weaned, now on RA.  He attributes improvement in SOB to d/c of brilinta.  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       - UCx neg; denies complaints this AM   DVT prophylaxis: SCD Code Status: FULL   Disposition Plan: To home pending pacer placement   Consultants:   ID  Cardiology     Antimicrobials:   Rocephin 2g IV q24h    Subjective: "I'm just ready to have this done."  Objective: Vitals:   10/29/18 1106 10/29/18 2050 10/30/18 0555 10/30/18 1038  BP: 120/71 (!) 141/71 137/67   Pulse: (!) 55 (!) 53 (!) 53   Resp: 20 17 17    Temp: 97.8 F (36.6 C) 98.3 F (36.8 C) 99.4 F (37.4 C)   TempSrc: Oral Oral Oral   SpO2: 95% 95% 93% 96%  Weight:   91.8 kg  Height:        Intake/Output Summary (Last 24 hours) at 10/30/2018 1105 Last data filed at 10/30/2018 1020 Gross per 24 hour  Intake 731 ml  Output 2925 ml  Net -2194 ml   Filed Weights   10/28/18 0603 10/29/18 0507 10/30/18 0555  Weight: 92.7 kg 92.6 kg 91.8 kg    Examination:  General exam: 78 y.o. male Appears calm and comfortable  Respiratory system: Clear to auscultation. Respiratory effort normal. Cardiovascular system: S1 & S2 heard, RRR. No JVD, murmurs, rubs, gallops or clicks. No pedal edema. Gastrointestinal system: Abdomen is nondistended, soft and nontender. No organomegaly or masses felt. Normal bowel sounds heard. Central nervous system: Alert and oriented. No focal neurological deficits. Extremities: Symmetric 5 x 5 power. Skin: No rashes, lesions or ulcers     Data Reviewed: I have personally reviewed following labs and imaging studies.  CBC: Recent Labs  Lab 10/25/18 0430 10/26/18 0707 10/28/18 0540 10/29/18 0500 10/30/18 0410  WBC 19.8* 9.6 8.0 9.0 8.8  NEUTROABS  --   --  3.6  --   --   HGB 15.2 16.0 16.2 16.5 16.8  HCT 44.1 46.8 48.1 48.6 48.9  MCV 90.4 91.6 91.3 91.9 91.1  PLT 161 177 221 267 725   Basic Metabolic Panel: Recent Labs  Lab 10/24/18 0207  10/25/18 0430 10/28/18 0540 10/29/18 0500 10/30/18 0410  NA 139 142 141 142 141  K 3.9 4.1 3.8 4.1 4.2  CL 108 110 106 108 106  CO2 22 25 24 26 24   GLUCOSE 99 108* 110* 110* 110*  BUN 23 18 15 15 15   CREATININE 1.31* 1.17 1.02 1.16 1.04  CALCIUM 9.0 9.6 9.4 9.7 9.9  MG 1.4* 1.9  --  1.9 1.9   GFR: Estimated Creatinine Clearance: 64.3 mL/min (by C-G formula based on SCr of 1.04 mg/dL). Liver Function Tests: Recent Labs  Lab 10/24/18 0207 10/25/18 0430 10/29/18 0500 10/30/18 0410  AST 34 25 24 19   ALT 31 29 39 33  ALKPHOS 43 53 53 55  BILITOT 1.0 1.0 0.7 0.6  PROT 5.3* 5.7* 6.4* 6.4*  ALBUMIN 2.6* 2.8* 3.1* 3.1*   No results for input(s): LIPASE, AMYLASE in the last 168 hours. No results for input(s): AMMONIA in the last 168 hours. Coagulation Profile: No results for input(s): INR, PROTIME in the last 168 hours. Cardiac Enzymes: No results for input(s): CKTOTAL, CKMB, CKMBINDEX, TROPONINI in the last 168 hours. BNP (last 3 results) No results for input(s): PROBNP in the last 8760 hours. HbA1C: No results for input(s): HGBA1C in the last 72 hours. CBG: No results for input(s): GLUCAP in the last 168 hours. Lipid Profile: No results for input(s): CHOL, HDL, LDLCALC, TRIG, CHOLHDL, LDLDIRECT in the last 72 hours. Thyroid Function Tests: No results for input(s): TSH, T4TOTAL, FREET4, T3FREE, THYROIDAB in the last 72 hours. Anemia Panel: No results for input(s): VITAMINB12, FOLATE, FERRITIN, TIBC, IRON, RETICCTPCT in the last 72 hours. Sepsis Labs: Recent Labs  Lab 10/23/18 2046 10/23/18 2305 10/24/18 0207 10/24/18 0435  LATICACIDVEN 3.1* 3.2* 2.0* 1.8    Recent Results (from the past 240 hour(s))  SARS Coronavirus 2 (CEPHEID- Performed in [place] hospital lab), Hosp Order     Status: None   Collection Time: 10/22/18 10:47 AM  Result Value Ref Range Status   SARS Coronavirus 2 NEGATIVE NEGATIVE Final    Comment: (NOTE) If result is NEGATIVE SARS-CoV-2  target nucleic acids are NOT DETECTED. The SARS-CoV-2 RNA is  generally detectable in upper and lower  respiratory specimens during the acute phase of infection. The lowest  concentration of SARS-CoV-2 viral copies this assay can detect is 250  copies / mL. A negative result does not preclude SARS-CoV-2 infection  and should not be used as the sole basis for treatment or other  patient management decisions.  A negative result may occur with  improper specimen collection / handling, submission of specimen other  than nasopharyngeal swab, presence of viral mutation(s) within the  areas targeted by this assay, and inadequate number of viral copies  (<250 copies / mL). A negative result must be combined with clinical  observations, patient history, and epidemiological information. If result is POSITIVE SARS-CoV-2 target nucleic acids are DETECTED. The SARS-CoV-2 RNA is generally detectable in upper and lower  respiratory specimens dur ing the acute phase of infection.  Positive  results are indicative of active infection with SARS-CoV-2.  Clinical  correlation with patient history and other diagnostic information is  necessary to determine patient infection status.  Positive results do  not rule out bacterial infection or co-infection with other viruses. If result is PRESUMPTIVE POSTIVE SARS-CoV-2 nucleic acids MAY BE PRESENT.   A presumptive positive result was obtained on the submitted specimen  and confirmed on repeat testing.  While 2019 novel coronavirus  (SARS-CoV-2) nucleic acids may be present in the submitted sample  additional confirmatory testing may be necessary for epidemiological  and / or clinical management purposes  to differentiate between  SARS-CoV-2 and other Sarbecovirus currently known to infect humans.  If clinically indicated additional testing with an alternate test  methodology [phone]) is advised. The SARS-CoV-2 RNA is generally  detectable in upper and lower  respiratory sp ecimens during the acute  phase of infection. The expected result is Negative. Fact Sheet for Patients:  StrictlyIdeas.no Fact Sheet for Healthcare Providers: BankingDealers.co.za This test is not yet approved or cleared by the [place] FDA and has been authorized for detection and/or diagnosis of SARS-CoV-2 by FDA under an Emergency Use Authorization (EUA).  This EUA will remain in effect (meaning this test can be used) for the duration of the COVID-19 declaration under Section 564(b)(1) of the Act, 21 U.S.C. section 360bbb-3(b)(1), unless the authorization is terminated or revoked sooner. Performed at [facility], [place] [address], [place], [place] [address]   Blood culture (routine x 2)     Status: Abnormal   Collection Time: 10/22/18 10:52 AM  Result Value Ref Range Status   Specimen Description BLOOD BLOOD LEFT FOREARM  Final   Special Requests   Final    BOTTLES DRAWN AEROBIC AND ANAEROBIC Blood Culture adequate volume   Culture  Setup Time   Final    IN BOTH AEROBIC AND ANAEROBIC BOTTLES GRAM POSITIVE COCCI CRITICAL RESULT CALLED TO, READ BACK BY AND VERIFIED WITH: J ORIET PHARMD 10/22/18 2300 JDW    Culture (A)  Final    STREPTOCOCCUS GALLOLYTICUS SUSCEPTIBILITIES PERFORMED ON PREVIOUS CULTURE WITHIN THE LAST 5 DAYS. Performed at [facility], [place] [address], [place], [place] [address]    Report Status 10/24/2018 FINAL  Final  Blood Culture ID Panel (Reflexed)     Status: Abnormal   Collection Time: 10/22/18 10:52 AM  Result Value Ref Range Status   Enterococcus species NOT DETECTED NOT DETECTED Final   Listeria monocytogenes NOT DETECTED NOT DETECTED Final   Staphylococcus species NOT DETECTED NOT DETECTED Final   Staphylococcus aureus (BCID) NOT DETECTED NOT  DETECTED Final   Streptococcus species DETECTED (A) NOT DETECTED Final    Comment: Not Enterococcus species, Streptococcus agalactiae,  Streptococcus pyogenes, or Streptococcus pneumoniae. CRITICAL RESULT CALLED TO, READ BACK BY AND VERIFIED WITH: J ORIET PHARMD 10/22/18 2300 JDW    Streptococcus agalactiae NOT DETECTED NOT DETECTED Final   Streptococcus pneumoniae NOT DETECTED NOT DETECTED Final   Streptococcus pyogenes NOT DETECTED NOT DETECTED Final   Acinetobacter baumannii NOT DETECTED NOT DETECTED Final   Enterobacteriaceae species NOT DETECTED NOT DETECTED Final   Enterobacter cloacae complex NOT DETECTED NOT DETECTED Final   Escherichia coli NOT DETECTED NOT DETECTED Final   Klebsiella oxytoca NOT DETECTED NOT DETECTED Final   Klebsiella pneumoniae NOT DETECTED NOT DETECTED Final   Proteus species NOT DETECTED NOT DETECTED Final   Serratia marcescens NOT DETECTED NOT DETECTED Final   Haemophilus influenzae NOT DETECTED NOT DETECTED Final   Neisseria meningitidis NOT DETECTED NOT DETECTED Final   Pseudomonas aeruginosa NOT DETECTED NOT DETECTED Final   Candida albicans NOT DETECTED NOT DETECTED Final   Candida glabrata NOT DETECTED NOT DETECTED Final   Candida krusei NOT DETECTED NOT DETECTED Final   Candida parapsilosis NOT DETECTED NOT DETECTED Final   Candida tropicalis NOT DETECTED NOT DETECTED Final    Comment: Performed at [facility], [place] [address], [place], [place] [address]  Urine culture     Status: Abnormal   Collection Time: 10/22/18 11:22 AM  Result Value Ref Range Status   Specimen Description URINE, RANDOM  Final   Special Requests NONE  Final   Culture (A)  Final    <10,000 COLONIES/mL INSIGNIFICANT GROWTH Performed at [place] [address] [place], [place] [address]    Report Status 10/23/2018 FINAL  Final  Blood culture (routine x 2)     Status: Abnormal   Collection Time: 10/22/18 11:23 AM  Result Value Ref Range Status   Specimen Description BLOOD RIGHT ANTECUBITAL  Final   Special Requests   Final    BOTTLES DRAWN AEROBIC AND ANAEROBIC Blood Culture adequate  volume   Culture  Setup Time   Final    IN BOTH AEROBIC AND ANAEROBIC BOTTLES GRAM POSITIVE COCCI CRITICAL VALUE NOTED.  VALUE IS CONSISTENT WITH PREVIOUSLY REPORTED AND CALLED VALUE. Performed at [facility], [place] [address], [place], [place] [address]    Culture STREPTOCOCCUS GALLOLYTICUS (A)  Final   Report Status 10/24/2018 FINAL  Final   Organism ID, Bacteria STREPTOCOCCUS GALLOLYTICUS  Final      Susceptibility   Streptococcus gallolyticus - MIC*    PENICILLIN 0.12 SENSITIVE Sensitive     CEFTRIAXONE <=0.12 SENSITIVE Sensitive     ERYTHROMYCIN <=0.12 SENSITIVE Sensitive     LEVOFLOXACIN 2 SENSITIVE Sensitive     VANCOMYCIN <=0.12 SENSITIVE Sensitive     * STREPTOCOCCUS GALLOLYTICUS  Respiratory Panel by PCR     Status: None   Collection Time: 10/22/18  5:44 PM  Result Value Ref Range Status   Adenovirus NOT DETECTED NOT DETECTED Final   Coronavirus 229E NOT DETECTED NOT DETECTED Final    Comment: (NOTE) The Coronavirus on the Respiratory Panel, DOES NOT test for the novel  Coronavirus (2019 nCoV)    Coronavirus HKU1 NOT DETECTED NOT DETECTED Final   Coronavirus NL63 NOT DETECTED NOT DETECTED Final   Coronavirus OC43 NOT DETECTED NOT DETECTED Final   Metapneumovirus NOT DETECTED NOT DETECTED Final   Rhinovirus / Enterovirus NOT DETECTED NOT DETECTED Final  Influenza A NOT DETECTED NOT DETECTED Final   Influenza B NOT DETECTED NOT DETECTED Final   Parainfluenza Virus 1 NOT DETECTED NOT DETECTED Final   Parainfluenza Virus 2 NOT DETECTED NOT DETECTED Final   Parainfluenza Virus 3 NOT DETECTED NOT DETECTED Final   Parainfluenza Virus 4 NOT DETECTED NOT DETECTED Final   Respiratory Syncytial Virus NOT DETECTED NOT DETECTED Final   Bordetella pertussis NOT DETECTED NOT DETECTED Final   Chlamydophila pneumoniae NOT DETECTED NOT DETECTED Final   Mycoplasma pneumoniae NOT DETECTED NOT DETECTED Final    Comment: Performed at [facility], [place] [address]  [place], [place] [address]  SARS Coronavirus 2 (CEPHEID- Performed in [place] hospital lab), Hosp Order     Status: None   Collection Time: 10/22/18  5:44 PM  Result Value Ref Range Status   SARS Coronavirus 2 NEGATIVE NEGATIVE Final    Comment: (NOTE) If result is NEGATIVE SARS-CoV-2 target nucleic acids are NOT DETECTED. The SARS-CoV-2 RNA is generally detectable in upper and lower  respiratory specimens during the acute phase of infection. The lowest  concentration of SARS-CoV-2 viral copies this assay can detect is 250  copies / mL. A negative result does not preclude SARS-CoV-2 infection  and should not be used as the sole basis for treatment or other  patient management decisions.  A negative result may occur with  improper specimen collection / handling, submission of specimen other  than nasopharyngeal swab, presence of viral mutation(s) within the  areas targeted by this assay, and inadequate number of viral copies  (<250 copies / mL). A negative result must be combined with clinical  observations, patient history, and epidemiological information. If result is POSITIVE SARS-CoV-2 target nucleic acids are DETECTED. The SARS-CoV-2 RNA is generally detectable in upper and lower  respiratory specimens dur ing the acute phase of infection.  Positive  results are indicative of active infection with SARS-CoV-2.  Clinical  correlation with patient history and other diagnostic information is  necessary to determine patient infection status.  Positive results do  not rule out bacterial infection or co-infection with other viruses. If result is PRESUMPTIVE POSTIVE SARS-CoV-2 nucleic acids MAY BE PRESENT.   A presumptive positive result was obtained on the submitted specimen  and confirmed on repeat testing.  While 2019 novel coronavirus  (SARS-CoV-2) nucleic acids may be present in the submitted sample  additional confirmatory testing may be necessary for epidemiological  and / or  clinical management purposes  to differentiate between  SARS-CoV-2 and other Sarbecovirus currently known to infect humans.  If clinically indicated additional testing with an alternate test  methodology [phone]) is advised. The SARS-CoV-2 RNA is generally  detectable in upper and lower respiratory sp ecimens during the acute  phase of infection. The expected result is Negative. Fact Sheet for Patients:  StrictlyIdeas.no Fact Sheet for Healthcare Providers: BankingDealers.co.za This test is not yet approved or cleared by the [place] FDA and has been authorized for detection and/or diagnosis of SARS-CoV-2 by FDA under an Emergency Use Authorization (EUA).  This EUA will remain in effect (meaning this test can be used) for the duration of the COVID-19 declaration under Section 564(b)(1) of the Act, 21 U.S.C. section 360bbb-3(b)(1), unless the authorization is terminated or revoked sooner. Performed at [facility], [place] [address] [place], [place] [address]   Urine culture     Status: None   Collection Time: 10/22/18  8:29 PM  Result Value Ref Range Status  Specimen Description URINE, RANDOM  Final   Special Requests NONE  Final   Culture   Final    NO GROWTH Performed at [facility], [place] [address], [place], [place] [address]    Report Status 10/24/2018 FINAL  Final  Culture, blood (routine x 2)     Status: None   Collection Time: 10/23/18  7:45 AM  Result Value Ref Range Status   Specimen Description BLOOD LEFT ANTECUBITAL  Final   Special Requests   Final    BOTTLES DRAWN AEROBIC ONLY Blood Culture adequate volume   Culture   Final    NO GROWTH 5 DAYS Performed at [facility], [place] [address], [place], [place] [address]    Report Status 10/28/2018 FINAL  Final  Culture, blood (routine x 2)     Status: None   Collection Time: 10/23/18  7:53 AM  Result Value Ref Range Status   Specimen Description  BLOOD RIGHT HAND  Final   Special Requests   Final    BOTTLES DRAWN AEROBIC ONLY Blood Culture adequate volume   Culture   Final    NO GROWTH 5 DAYS Performed at [facility], [place] [address], [place], [place] [address]    Report Status 10/28/2018 FINAL  Final  Culture, blood (routine x 2)     Status: None (Preliminary result)   Collection Time: 10/26/18  7:07 AM  Result Value Ref Range Status   Specimen Description BLOOD LEFT ANTECUBITAL  Final   Special Requests   Final    BOTTLES DRAWN AEROBIC AND ANAEROBIC Blood Culture adequate volume   Culture   Final    NO GROWTH 4 DAYS Performed at [facility], [place] [address], [place], [place] [address]    Report Status PENDING  Incomplete  Culture, blood (routine x 2)     Status: None (Preliminary result)   Collection Time: 10/26/18  7:21 AM  Result Value Ref Range Status   Specimen Description BLOOD BLOOD LEFT HAND  Final   Special Requests   Final    BOTTLES DRAWN AEROBIC AND ANAEROBIC Blood Culture adequate volume   Culture   Final    NO GROWTH 4 DAYS Performed at [facility], [place] [address] [place], [place] [address]    Report Status PENDING  Incomplete  Culture, Urine     Status: None   Collection Time: 10/28/18 11:03 AM  Result Value Ref Range Status   Specimen Description URINE, RANDOM  Final   Special Requests NONE  Final   Culture   Final    NO GROWTH Performed at [facility], [place] [address] [place], [place] [address]    Report Status 10/29/2018 FINAL  Final  Surgical PCR screen     Status: None   Collection Time: 10/29/18  6:01 PM  Result Value Ref Range Status   MRSA, PCR NEGATIVE NEGATIVE Final   Staphylococcus aureus NEGATIVE NEGATIVE Final    Comment: (NOTE) The Xpert SA Assay (FDA approved for NASAL specimens in patients 22 years of age and older), is one component of a comprehensive surveillance program. It is not intended to diagnose infection nor to guide or monitor  treatment. Performed at [facility], [place] [address] [place],  [address]          Radiology Studies: No results found.      Scheduled Meds:  [MAR Hold] anastrozole  1 mg Oral Once per  day on Mon Thu   [facility] Hold] aspirin EC  81 mg Oral Daily   [MAR Hold] clopidogrel  75 mg Oral Daily   [MAR Hold] ezetimibe  10 mg Oral Daily   [MAR Hold] fluticasone  2 spray Each Nare Daily   gentamicin irrigation  80 mg Irrigation On Call   [MAR Hold] pantoprazole  40 mg Oral Daily   [MAR Hold] pravastatin  40 mg Oral q1800   [MAR Hold] sodium chloride flush  3 mL Intravenous Q12H   [MAR Hold] tamsulosin  0.4 mg Oral QPC breakfast   [MAR Hold] thyroid  120 mg Oral QAC breakfast   [MAR Hold] thyroid  60 mg Oral QPM   Continuous Infusions:  sodium chloride 10 mL/hr at 10/30/18 0742   sodium chloride 10 mL/hr at 10/30/18 0744   [MAR Hold] cefTRIAXone (ROCEPHIN)  IV 2 g (10/29/18 1723)     LOS: 8 days    Time spent: 20 minutes spent in the coordination of care today.    Jonnie Finner, DO [facility] Pager [phone]  If 7PM-7AM, please contact night-coverage [email or web address] Password [email or web address] 10/30/2018, 11:05 AM

## 2018-10-30 NOTE — Plan of Care (Signed)

## 2018-10-30 NOTE — Progress Notes (Signed)
Patient received back from pacemaker implantation he has 2 clean dry dressings to his right upper chest and left upper chest vs are stable no change from previous am assessment noted.

## 2018-10-30 NOTE — Progress Notes (Signed)
Orthopedic Tech Progress Note Patient Details:  [name] Glass Oct 22, 1940 [id number]  Ortho Devices Type of Ortho Device: Arm sling       [name] 10/30/2018, 1:19 PM

## 2018-10-30 NOTE — Progress Notes (Addendum)
Progress Note  Patient Name: [name] Glass Date of Encounter: 10/30/2018  Primary Cardiologist: Dr. [name] Electrophysiologist: Dr. Caryl Comes  Subjective   Breathing nearly back to normal off Brilinta, no CP, relieved to be getting PPM today  Inpatient Medications    Scheduled Meds: . anastrozole  1 mg Oral Once per day on Mon Thu  . aspirin EC  81 mg Oral Daily  . clopidogrel  75 mg Oral Daily  . ezetimibe  10 mg Oral Daily  . fluticasone  2 spray Each Nare Daily  . gentamicin irrigation  80 mg Irrigation On Call  . pantoprazole  40 mg Oral Daily  . pravastatin  40 mg Oral q1800  . sodium chloride flush  3 mL Intravenous Q12H  . tamsulosin  0.4 mg Oral QPC breakfast  . thyroid  120 mg Oral QAC breakfast  . thyroid  60 mg Oral QPM   Continuous Infusions: . sodium chloride 10 mL/hr at 10/30/18 0742  . sodium chloride 10 mL/hr at 10/30/18 0744  .  ceFAZolin (ANCEF) IV    . cefTRIAXone (ROCEPHIN)  IV 2 g (10/29/18 1723)   PRN Meds: acetaminophen, albuterol, HYDROmorphone (DILAUDID) injection, ondansetron **OR** ondansetron (ZOFRAN) IV, oxyCODONE, sodium chloride flush   Vital Signs    Vitals:   10/29/18 0507 10/29/18 1106 10/29/18 2050 10/30/18 0555  BP: 135/78 120/71 (!) 141/71 137/67  Pulse: 62 (!) 55 (!) 53 (!) 53  Resp: 18 20 17 17   Temp: 98 F (36.7 C) 97.8 F (36.6 C) 98.3 F (36.8 C) 99.4 F (37.4 C)  TempSrc:  Oral Oral Oral  SpO2: 92% 95% 95% 93%  Weight: 92.6 kg   91.8 kg  Height:        Intake/Output Summary (Last 24 hours) at 10/30/2018 0806 Last data filed at 10/30/2018 0500 Gross per 24 hour  Intake 971 ml  Output 2725 ml  Net -1754 ml   Last 3 Weights 10/30/2018 10/29/2018 10/28/2018  Weight (lbs) 202 lb 4.8 oz 204 lb 3.2 oz 204 lb 6.4 oz  Weight (kg) 91.763 kg 92.625 kg 92.715 kg      Telemetry    SR, often v pacing,- Personally Reviewed  ECG    No new EKGs - Personally Reviewed  Physical Exam   GEN: No acute distress.   Neck: No  JVD Cardiac: RRR, no murmurs, rubs, or gallops.  Respiratory: normal WOB. GI: not examined  MS: No edema; No deformity. Neuro:  Nonfocal  Psych: Normal affect   L neck: Temp -perm in place L chest removal site is dressed, dry  Labs    Chemistry Recent Labs  Lab 10/25/18 0430 10/28/18 0540 10/29/18 0500 10/30/18 0410  NA 142 141 142 141  K 4.1 3.8 4.1 4.2  CL 110 106 108 106  CO2 25 24 26 24   GLUCOSE 108* 110* 110* 110*  BUN 18 15 15 15   CREATININE 1.17 1.02 1.16 1.04  CALCIUM 9.6 9.4 9.7 9.9  PROT 5.7*  --  6.4* 6.4*  ALBUMIN 2.8*  --  3.1* 3.1*  AST 25  --  24 19  ALT 29  --  39 33  ALKPHOS 53  --  53 55  BILITOT 1.0  --  0.7 0.6  GFRNONAA 59* >60 60* >60  GFRAA >60 >60 >60 >60  ANIONGAP 7 11 8 11      Hematology Recent Labs  Lab 10/28/18 0540 10/29/18 0500 10/30/18 0410  WBC 8.0 9.0 8.8  RBC 5.27  5.29 5.37  HGB 16.2 16.5 16.8  HCT 48.1 48.6 48.9  MCV 91.3 91.9 91.1  MCH 30.7 31.2 31.3  MCHC 33.7 34.0 34.4  RDW 18.6* 18.7* 18.5*  PLT 221 267 278    Cardiac Enzymes No results for input(s): TROPONINI in the last 168 hours. No results for input(s): TROPIPOC in the last 168 hours.   BNP No results for input(s): BNP, PROBNP in the last 168 hours.   DDimer No results for input(s): DDIMER in the last 168 hours.   Radiology      Cardiac Studies   10/23/2018: TTE IMPRESSIONS 1. The left ventricle has low normal systolic function, with an ejection fraction of 50-55%. The cavity size was normal. There is moderate concentric left ventricular hypertrophy. Left ventricular diastolic Doppler parameters are consistent with  pseudonormalization. Indeterminate filling pressures. 2. The right ventricle has normal systolic function. The cavity was normal. There is no increase in right ventricular wall thickness. Right ventricular systolic pressure could not be assessed. 3. Left atrial size was severely dilated. 4. The aortic valve has an indeterminate number  of cusps. Moderate thickening of the aortic valve. Moderate calcification of the aortic valve. Aortic valve regurgitation is mild by color flow Doppler. Mild stenosis of the aortic valve. 5. The inferior vena cava was dilated in size with <50% respiratory variability.  FINDINGS Left Ventricle: The left ventricle has low normal systolic function, with an ejection fraction of 50-55%. The cavity size was normal. There is moderate concentric left ventricular hypertrophy. Left ventricular diastolic Doppler parameters are consistent with pseudonormalization. Indeterminate filling pressures  Right Ventricle: The right ventricle has normal systolic function. The cavity was normal. There is no increase in right ventricular wall thickness. Right ventricular systolic pressure could not be assessed.  Left Atrium: Left atrial size was severely dilated.  Right Atrium: Right atrial size was normal in size. Right atrial pressure is estimated at 10 mmHg.  Interatrial Septum: No atrial level shunt detected by color flow Doppler.  Pericardium: There is no evidence of pericardial effusion.  Mitral Valve: The mitral valve is normal in structure. Mitral valve regurgitation is mild by color flow Doppler.  Tricuspid Valve: The tricuspid valve is normal in structure. Tricuspid valve regurgitation is trivial by color flow Doppler.  Aortic Valve: The aortic valve has an indeterminate number of cusps Moderate thickening of the aortic valve, with moderately decreased cusp excursion. Moderate calcification of the aortic valve. Aortic valve regurgitation is mild by color flow Doppler.  There is Mild stenosis of the aortic valve.  Pulmonic Valve: The pulmonic valve was normal in structure. Pulmonic valve regurgitation was not assessed by color flow Doppler.  Venous: The inferior vena cava is dilated in size with less than 50% respiratory variability.    Patient Profile     78 y.o. male with a hx of  CAD (number of stents over the years, most recent PCI Jan 2020) , HTN, HLD, hypothyroidism, and sinus node dysfunction w/PPM admitted with sepsis found to have strep bacteremia  He is feeling much better since being on antibiotics and off brilinta   Device information MDT dual chamber PPM implanted 06/22/2018  >> removed 10/25/2018  Assessment & Plan    1. Sepsis     Strep bacteremia, Streptococcus species     ID on case  2. Presumed device/pocket infection     TTE made no mention of vegetations      The pocket looks OK, there is a small area (78mm)  of a dark spot middle of his healed incision.    3. PPM indication was sinus node dysfunction      TEE w/o vegetation S/p PPM system removal 5/22 ID has cleared for implant of new PPM 10/23/2018 BC negative x5 days (x2) 10/26/2018 BC negative x2 to date  Planned for R sided PPM implant with removal of temp-perm device today Dr. Curt Bears has seen and discussed procedures with the patient, potential risks/benefits, he is agreeable to proceed.  4. SOB     much better off Brilinta     Back on Plavix last PM       For questions or updates, please contact [place] Please consult [email or web address] for contact info under        Signed, Baldwin Jamaica, PA-C  10/30/2018, 8:06 AM     I have seen and examined this patient with Tommye Standard.  Agree with above, note added to reflect my findings.  On exam, RRR, no mrumurs, lungs clear. Pacemaker removed due to infection with temp/perm in the left IJ. Per ID, OK to re-implant today. Virda Betters plan to implant on right side with removal of temp/perm device.  DONTEE JASO has presented today for surgery, with the diagnosis of sick sinus/syncope.  The various methods of treatment have been discussed with the patient and family. After consideration of risks, benefits and other options for treatment, the patient has consented to  Procedure(s): Pacemaker implant as a surgical intervention .  Risks  include but not limited to bleeding, tamponade, infection, pneumothorax, among others. The patient's history has been reviewed, patient examined, no change in status, stable for surgery.  I have reviewed the patient's chart and labs.  Questions were answered to the patient's satisfaction.     Rema Lievanos M. Jennika Ringgold MD 10/30/2018 8:27 AM

## 2018-10-31 ENCOUNTER — Inpatient Hospital Stay (HOSPITAL_COMMUNITY): Payer: Medicare Other

## 2018-10-31 LAB — CBC WITH DIFFERENTIAL/PLATELET
Abs Immature Granulocytes: 0.07 10*3/uL (ref 0.00–0.07)
Basophils Absolute: 0 10*3/uL (ref 0.0–0.1)
Basophils Relative: 1 %
Eosinophils Absolute: 0.1 10*3/uL (ref 0.0–0.5)
Eosinophils Relative: 1 %
HCT: 46.3 % (ref 39.0–52.0)
Hemoglobin: 15.4 g/dL (ref 13.0–17.0)
Immature Granulocytes: 1 %
Lymphocytes Relative: 24 %
Lymphs Abs: 2 10*3/uL (ref 0.7–4.0)
MCH: 30.9 pg (ref 26.0–34.0)
MCHC: 33.3 g/dL (ref 30.0–36.0)
MCV: 92.8 fL (ref 80.0–100.0)
Monocytes Absolute: 1.2 10*3/uL — ABNORMAL HIGH (ref 0.1–1.0)
Monocytes Relative: 14 %
Neutro Abs: 5 10*3/uL (ref 1.7–7.7)
Neutrophils Relative %: 59 %
Platelets: 317 10*3/uL (ref 150–400)
RBC: 4.99 MIL/uL (ref 4.22–5.81)
RDW: 18 % — ABNORMAL HIGH (ref 11.5–15.5)
WBC: 8.4 10*3/uL (ref 4.0–10.5)
nRBC: 0 % (ref 0.0–0.2)

## 2018-10-31 LAB — RENAL FUNCTION PANEL
Albumin: 2.8 g/dL — ABNORMAL LOW (ref 3.5–5.0)
Anion gap: 7 (ref 5–15)
BUN: 15 mg/dL (ref 8–23)
CO2: 32 mmol/L (ref 22–32)
Calcium: 9.4 mg/dL (ref 8.9–10.3)
Chloride: 100 mmol/L (ref 98–111)
Creatinine, Ser: 1.05 mg/dL (ref 0.61–1.24)
GFR calc Af Amer: >60 mL/min (ref >60)
GFR calc non Af Amer: >60 mL/min (ref >60)
Glucose, Bld: 92 mg/dL (ref 70–99)
Phosphorus: 4 mg/dL (ref 2.5–4.6)
Potassium: 3.9 mmol/L (ref 3.5–5.1)
Sodium: 139 mmol/L (ref 135–145)

## 2018-10-31 LAB — CULTURE, BLOOD (ROUTINE X 2)
Culture: NO GROWTH
Culture: NO GROWTH
Special Requests: ADEQUATE
Special Requests: ADEQUATE

## 2018-10-31 LAB — MAGNESIUM: Magnesium: 1.8 mg/dL (ref 1.7–2.4)

## 2018-10-31 MED ORDER — HYDRALAZINE HCL 25 MG PO TABS: 25.0000 mg | ORAL_TABLET | Freq: Three times a day (TID) | ORAL | 0 refills | Status: DC

## 2018-10-31 MED ORDER — CLOPIDOGREL BISULFATE 75 MG PO TABS: 75.0000 mg | ORAL_TABLET | Freq: Every day | ORAL | 0 refills | Status: DC

## 2018-10-31 MED ORDER — CEFTRIAXONE IV (FOR PTA / DISCHARGE USE ONLY): 2.0000 g | INTRAVENOUS | 0 refills | Status: DC

## 2018-10-31 MED ORDER — HYDRALAZINE HCL 25 MG PO TABS
25.0000 mg | ORAL_TABLET | Freq: Three times a day (TID) | ORAL | Status: DC
  Administered 2018-10-31: 25 mg via ORAL
  Filled 2018-10-31: qty 1

## 2018-10-31 MED ORDER — OXYCODONE HCL 5 MG PO TABS: 5.0000 mg | ORAL_TABLET | ORAL | 0 refills | Status: AC | PRN

## 2018-10-31 MED FILL — Lidocaine HCl Local Inj 1%: INTRAMUSCULAR | Qty: 80 | Status: AC

## 2018-10-31 NOTE — Progress Notes (Signed)
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 To discharging provider:  please sign these orders via discharge navigator,  Select New Orders & click on the button choice - Manage This Unsigned Work.     Thank you for allowing pharmacy to be a part of this patient's care.  Anette Guarneri, PharmD 10/31/2018, 10:40 AM

## 2018-10-31 NOTE — Progress Notes (Signed)
Md informed of patient elevated bp has been receiving frequent pain meds brings bp down but it is still elevated more than we would like it to be will alert MD ask about scheduled pain med? For something for better bp control. Asked patient having pain he states yes but I don't want to complain.

## 2018-10-31 NOTE — TOC Transition Note (Signed)
Transition of Care [facility]) - CM/SW Discharge Note   Patient Details  Name: [name] Glass MRN: [id number] Date of Birth: 09-02-1940  Transition of Care [facility]) CM/SW Contact:  Royston Bake, RN Phone Number: 10/31/2018, 2:01 PM   Clinical Narrative:    Patient is to go home today with IV antibiotics provided by [facility] and [facility]; Received message from [place] that everything was completed  For dc home.     Barriers to Discharge: Continued Medical Work up   Patient Goals and CMS Choice Patient states their goals for this hospitalization and ongoing recovery are:: to go to "my condo in [place] for the race" CMS Medicare.gov Compare Post Acute Care list provided to:: Patient Choice offered to / list presented to : Patient  Discharge Placement                       Discharge Plan and Services   Discharge Planning Services: CM Consult Post Acute Care Choice: Home Health          DME Arranged: IV pump/equipment DME Agency: [facility]sid[facility] Evert) Date DME Agency Contacted: 10/26/18 Time DME Agency Contacted: 1400 Representative spoke with at DME Agency: Carolynn Sayers, RN HH Arranged: RN [place] Agency: [place] Date [place]: 10/26/18 Time [place]: 74 Representative spoke with at [place]: [place] (Alpha) Interventions     Readmission Risk Interventions No flowsheet data found.

## 2018-10-31 NOTE — Plan of Care (Signed)

## 2018-10-31 NOTE — Progress Notes (Addendum)
Progress Note  Patient Name: [name] Glass Date of Encounter: 10/31/2018  Primary Cardiologist: Dr. [name] Electrophysiologist: Dr. Caryl Comes  Subjective   Feeling well.  Last night/early this AM reported significant pain at implant site, much better this AM.  No SOB, no CP.  Hopes to go home  Inpatient Medications    Scheduled Meds: . anastrozole  1 mg Oral Once per day on Mon Thu  . aspirin EC  81 mg Oral Daily  . clopidogrel  75 mg Oral Daily  . ezetimibe  10 mg Oral Daily  . fluticasone  2 spray Each Nare Daily  . hydrALAZINE  25 mg Oral Q8H  . pantoprazole  40 mg Oral Daily  . pravastatin  40 mg Oral q1800  . sodium chloride flush  3 mL Intravenous Q12H  . tamsulosin  0.4 mg Oral QPC breakfast  . thyroid  120 mg Oral QAC breakfast  . thyroid  60 mg Oral QPM   Continuous Infusions: . cefTRIAXone (ROCEPHIN)  IV 200 mL/hr at 10/30/18 1732   PRN Meds: acetaminophen, albuterol, HYDROmorphone (DILAUDID) injection, ondansetron **OR** ondansetron (ZOFRAN) IV, oxyCODONE, sodium chloride flush   Vital Signs    Vitals:   10/30/18 1500 10/30/18 2052 10/31/18 0400 10/31/18 0841  BP: (!) 161/89 (!) 194/98 (!) 178/100 (!) 154/103  Pulse: 60 (!) 59 60 (!) 120  Resp:  18 19 20   Temp:  (!) 97.5 F (36.4 C) 97.8 F (36.6 C)   TempSrc:  Oral Oral   SpO2:  96% 93% 96%  Weight:   91.7 kg   Height:        Intake/Output Summary (Last 24 hours) at 10/31/2018 1046 Last data filed at 10/31/2018 0755 Gross per 24 hour  Intake 1375.67 ml  Output 1700 ml  Net -324.33 ml   Last 3 Weights 10/31/2018 10/30/2018 10/29/2018  Weight (lbs) 202 lb 3.2 oz 202 lb 4.8 oz 204 lb 3.2 oz  Weight (kg) 91.717 kg 91.763 kg 92.625 kg      Telemetry    AP/VS predominantly,- Personally Reviewed  ECG    AP/VS - Personally Reviewed  Physical Exam   GEN: No acute distress.   Neck: No JVD Cardiac: RRR, no murmurs, rubs, or gallops.  Respiratory: CTA b/l GI: not examined  MS: No edema; No  deformity. Neuro:  Nonfocal  Psych: Normal affect   L neck: site is stable, no bleeding/hematoma, dressing removed L chest removal site, sutures in place, site is stable.  Loose/dry dressing placed R chest: implant site is stable, very small hematoma, no bleeding  Labs    Chemistry Recent Labs  Lab 10/25/18 0430  10/29/18 0500 10/30/18 0410 10/31/18 0435  NA 142   < > 142 141 139  K 4.1   < > 4.1 4.2 3.9  CL 110   < > 108 106 100  CO2 25   < > 26 24 32  GLUCOSE 108*   < > 110* 110* 92  BUN 18   < > 15 15 15   CREATININE 1.17   < > 1.16 1.04 1.05  CALCIUM 9.6   < > 9.7 9.9 9.4  PROT 5.7*  --  6.4* 6.4*  --   ALBUMIN 2.8*  --  3.1* 3.1* 2.8*  AST 25  --  24 19  --   ALT 29  --  39 33  --   ALKPHOS 53  --  53 55  --   BILITOT 1.0  --  0.7 0.6  --   GFRNONAA 59*   < > 60* >60 >60  GFRAA >60   < > >60 >60 >60  ANIONGAP 7   < > 8 11 7    < > = values in this interval not displayed.     Hematology Recent Labs  Lab 10/29/18 0500 10/30/18 0410 10/31/18 0435  WBC 9.0 8.8 8.4  RBC 5.29 5.37 4.99  HGB 16.5 16.8 15.4  HCT 48.6 48.9 46.3  MCV 91.9 91.1 92.8  MCH 31.2 31.3 30.9  MCHC 34.0 34.4 33.3  RDW 18.7* 18.5* 18.0*  PLT 267 278 317    Cardiac Enzymes No results for input(s): TROPONINI in the last 168 hours. No results for input(s): TROPIPOC in the last 168 hours.   BNP No results for input(s): BNP, PROBNP in the last 168 hours.   DDimer No results for input(s): DDIMER in the last 168 hours.   Radiology      Cardiac Studies   10/23/2018: TTE IMPRESSIONS 1. The left ventricle has low normal systolic function, with an ejection fraction of 50-55%. The cavity size was normal. There is moderate concentric left ventricular hypertrophy. Left ventricular diastolic Doppler parameters are consistent with  pseudonormalization. Indeterminate filling pressures. 2. The right ventricle has normal systolic function. The cavity was normal. There is no increase in right  ventricular wall thickness. Right ventricular systolic pressure could not be assessed. 3. Left atrial size was severely dilated. 4. The aortic valve has an indeterminate number of cusps. Moderate thickening of the aortic valve. Moderate calcification of the aortic valve. Aortic valve regurgitation is mild by color flow Doppler. Mild stenosis of the aortic valve. 5. The inferior vena cava was dilated in size with <50% respiratory variability.  FINDINGS Left Ventricle: The left ventricle has low normal systolic function, with an ejection fraction of 50-55%. The cavity size was normal. There is moderate concentric left ventricular hypertrophy. Left ventricular diastolic Doppler parameters are consistent with pseudonormalization. Indeterminate filling pressures  Right Ventricle: The right ventricle has normal systolic function. The cavity was normal. There is no increase in right ventricular wall thickness. Right ventricular systolic pressure could not be assessed.  Left Atrium: Left atrial size was severely dilated.  Right Atrium: Right atrial size was normal in size. Right atrial pressure is estimated at 10 mmHg.  Interatrial Septum: No atrial level shunt detected by color flow Doppler.  Pericardium: There is no evidence of pericardial effusion.  Mitral Valve: The mitral valve is normal in structure. Mitral valve regurgitation is mild by color flow Doppler.  Tricuspid Valve: The tricuspid valve is normal in structure. Tricuspid valve regurgitation is trivial by color flow Doppler.  Aortic Valve: The aortic valve has an indeterminate number of cusps Moderate thickening of the aortic valve, with moderately decreased cusp excursion. Moderate calcification of the aortic valve. Aortic valve regurgitation is mild by color flow Doppler.  There is Mild stenosis of the aortic valve.  Pulmonic Valve: The pulmonic valve was normal in structure. Pulmonic valve regurgitation was not  assessed by color flow Doppler.  Venous: The inferior vena cava is dilated in size with less than 50% respiratory variability.    Patient Profile     78 y.o. male with a hx of CAD (number of stents over the years, most recent PCI Jan 2020) , HTN, HLD, hypothyroidism, and sinus node dysfunction w/PPM admitted with sepsis found to have strep bacteremia  He is feeling much better since being on antibiotics and off  brilinta  Assessment & Plan    1. Sepsis     Strep bacteremia, Streptococcus species     ID on case  2. Presumed device/pocket infection     TTE made no mention of vegetations        3. PPM indication was sinus node dysfunction      TEE w/o vegetation S/p PPM system removal 5/22 and temp-perm placement L IJ ID has cleared for implant of new PPM 10/23/2018 BC negative x5 days (x2) 10/26/2018 BC  x2 remain negative to date  Now s/p temp-perm pacer removal and new PPM implant to R side All sites are stable.  R chest site with a very small hematoma Device check this AM with stable measurements CXR with no ptx, stable lead position Site care and activity instructions were reviewed with the patient EP follow up is in place Discussed for L site, sutures Adelee Hannula be removed at wound check visit, no active bleeding, change dressing as needed.  Clean gauze, and tape, shown how. (though he Amaiah Cristiano be getting [place] RN given picc/and antibiotics)  From our perspective OK to discharge  4. SOB     much better off Brilinta     Back on Plavix   5. HTN last night     BP this AM by me is 154/81      Pt suspects 2/2 pain, not typically hypertensive     Leoma Folds defer to IM who is addressing                  For questions or updates, please contact [place] Please consult [email or web address] for contact info under        Signed, Baldwin [place], PA-C  10/31/2018, 10:46 AM     I have seen and examined this patient with Tommye Standard.  Agree with above, note added to reflect my  findings.  On exam, RRR, no murmurs, lungs clear.  Pacemaker implanted in the right chest yesterday.  Chest x-ray interrogation is without issues.  Temp perm was a removed from the left IJ.  At this point, no further EP work-up is necessary.  We Nilsa Macht arrange for a follow-up in device clinic.  Roemello Speyer M. Calixto Pavel MD 10/31/2018 11:00 AM

## 2018-10-31 NOTE — Progress Notes (Signed)
Discharge instructions given to patient as well as RX script for IVPB ABT's,home health nurse of Alvis Lemmings to follow up with him at home for continued IV infusion treatment. Patient has call for his ride home. Writer has sent for his valuable he locked up in the safe.

## 2018-10-31 NOTE — Discharge Summary (Signed)
. Physician Discharge Summary  [name] Glass ZCH:[id number] DOB: 10/04/1940 DOA: 10/22/2018  PCP: [name] Lima, MD  Admit date: 10/22/2018 Discharge date: 10/31/2018  Admitted From: Home Disposition:  To home.   Recommendations for Outpatient Follow-up:  1. Follow up with PCP in 1-2 weeks 2. Please obtain BMP/CBC in one week 3. Follow up with Infectious Disease on 11/20/18   Discharge Condition: Stable  CODE STATUS: FULL   Brief/Interim Summary: [name] Weick [name] a 78 y.o.malewith history h/ohyperlipidemia, hypertension, CAD status post PTCA x 6, with last stent in January, GERD, arthritis, hiatal hernia, sleep apnea presents to the ED today with complaints of productive cough and dyspnea. Patient states he has had on and off symptoms of bronchitis and received at least 2 courses of antibiotics within the last 3 months. On presentation to the ED he was noted to be febrile, tachycardic, dyspneic and hypoxic requiring 2 L of nasal cannula.Labs revealed elevated lactate at 4, sepsis protocol was initiated. Patient received IV fluids broad-spectrum antibiotics and underwent COVID-19 work-up as well as CT chest to rule out PE/pneumonia which were all unremarkable. Patient lives with his wife and denies any sick contacts. Skin redness noted along his trunk and face which he relates to sunburn.He denies any chest pain or leg swellings or headache or dizziness. He does report low back pain and points to right iliac/paraspinal area. He does have a history of kidney stones but does not feel this pain is like prior episodes and denies any dysuria/hematuria. He drinks scotch daily but denies any history of alcohol withdrawal.  Patient does have long history of CAD and had 5 stents placed more than 10 years back by Dr [name] Glass. He reports undergoing a stress test in [place] at one point which was apparently good.He was in [facility] and had an episode of syncope.He ultimately  was transported by EMS to [facility] where he underwent cardiac catheterizationfor NSTEMI revealinghigh-grade calcified LAD stenosis requiring DESimplantation in Jan 2020.He has remained on aspirin and Brilinta since then.He also was noted to have long pauses on telemetry with recurrent syncopeduring that hospitalizationand andunderwentpermanent pacemaker implantation.When seen by Dr. Alvester Glass for follow-up on January 28, patient still had pocket staples in. He currently has tenderness at pacemaker site as well as a dry whitish scab (which was also noted at follow-up visit with cardiologist in March). Patient denies noticing any discharge.He did receive a course of abx (doxycycline) in January for cellulitis (apparently hadrt forearm cellulitis, rtknee injury from fall at [place]) and Omnicef in March (for pharyngitis/bronchitis)  Admitted for strep bacteremia thought 2/2 pacemaker pocket infection and suspected early lumbar infection. Now s/p pacemaker removal on 5/22. At this point in time, awaiting replacement of pacemaker and PICC placement. Will be ready for discharge after pacemaker replaced and pt gets PICC.  Discharge Diagnoses:  Principal Problem:   Streptococcal Gallolyticus Bacteremia Active Problems:   Coronary artery disease   OA (osteoarthritis) of knee   Gastroesophageal reflux disease without esophagitis   Essential hypertension, benign   Hyperlipidemia with target LDL less than 70   Chronic renal disease, stage 2, mildly decreased glomerular filtration rate (GFR) between 60-89 mL/min/1.73 square meter   Sepsis ([place])   H/O splenectomy   Presence of permanent cardiac pacemaker   Sleep apnea   Acute low back pain   Chronic bronchitis (HCC)  Resolving sepsis secondary to strep Gallolyticus bacteremiastatus post pacing system removal on 10/25/2018 Suspected Early Lumbar Infection     -  Presented with fever with T-max of 102.5,  tachycardia with heart rate 133, tachypnea with respiration rate 32      - Blood cultures drawn on 10/22/2018+ forstrep gallolyticus      - 5/20 and 5/23 repeat cultures so for NGTD      - MRI spine with mild paraspinous edema involving R>L posterior paraspinous musculature (consider strain vs myositis). No other evidence for acute infection within lumbar spine.      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 Will need to follow-up outpatient with GI -> will need elective colonoscopy as outpatient; Self-reported last colonoscopy was 3 years ago with GI      - S/p TEE on 10/25/2018 no valvular vegetation or ICD vegetation. 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     - Post pacing system removal on 10/25/2018     - Continue ceftriaxone through 11/20/2018 per ID; follow up with them 11/20/2018     - PICC line for long-term IV antibiotics     - Infectious disease following, now signed off     - s/p pacer to be replacement  Resolving acute hypoxic respiratory failure     - Work-up unrevealing     - CTA PE negative     - Chest x-ray unremarkable     - Brilinta has been discontinued due to her shortness of breath, started on Plavix     - O2 has been weaned, now on RA.  He attributes improvement in SOB to d/c of brilinta.  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       - UCx neg; denies complaints this AM  HTN     - add hydralazine  Discharge Instructions 1. Follow up with PCP in 1 week. 2. Follow up with Cardiology as scheduled. 3. Follow up with Infectious Disease on 11/20/18   Allergies as of 10/31/2018      Reactions   Morphine And Related    Had "crazy dreams" felt "crazy" per pt.   Bactrim [sulfamethoxazole-trimethoprim]    Statins       Medication List  STOP taking these medications   acetaminophen 650 MG CR tablet Commonly known as:  TYLENOL   acyclovir 200 MG capsule Commonly known as:  ZOVIRAX   Brilinta 90 MG Tabs tablet Generic drug:  ticagrelor   cefdinir 300 MG capsule Commonly known as:  OMNICEF   potassium gluconate 595 (99 K) MG Tabs tablet     TAKE these medications   acidophilus Caps capsule Take 1 capsule by mouth 2 (two) times daily.   anastrozole 1 MG tablet Commonly known as:  ARIMIDEX Take 1 mg by mouth 2 (two) times a week.   aspirin EC 81 MG tablet Take 1 tablet (81 mg total) by mouth daily.   cefTRIAXone  IVPB Commonly known as:  ROCEPHIN Inject 2 g into the vein daily for 22 days. Indication:  Pacemaker pocket infection w/Strep bacteremia Last Day of Therapy:  11/20/18 Labs - Once weekly:  CBC/D and BMP, Labs - Every other week:  ESR and CRP   clopidogrel 75 MG tablet Commonly known as:  PLAVIX Take 1 tablet (75 mg total) by mouth daily. Start taking on:  Nov 01, 2018   Dexilant 60 MG capsule Generic drug:  dexlansoprazole Take 1 capsule by mouth  daily.   DHEA PO Take 25 mg by mouth.   ezetimibe 10 MG tablet Commonly known as:  ZETIA TAKE 1 TABLET BY MOUTH EVERY DAY   fluticasone 50 MCG/ACT nasal spray Commonly known as:  FLONASE USE DAILY AS DIRECTED What changed:    how much to take  how to take this  when to take this   hydrALAZINE 25 MG tablet Commonly known as:  APRESOLINE Take 1 tablet (25 mg total) by mouth 3 (three) times daily for 30 days.   liothyronine 25 MCG tablet Commonly known as:  CYTOMEL Take 25 mcg by mouth every morning.   oxyCODONE 5 MG immediate release tablet Commonly known as:  Oxy IR/ROXICODONE Take 1 tablet (5 mg total) by mouth every 4 (four) hours as needed for up to 3 days for severe pain.   Pitavastatin Calcium 4 MG Tabs Commonly known as:  Livalo Take 1 tablet (4 mg total) by mouth daily.   tamsulosin 0.4 MG Caps capsule Commonly known as:  FLOMAX Take 0.4 mg by mouth daily after breakfast.   thyroid 60 MG tablet Commonly known as:  ARMOUR Take 60-120 mg by mouth 2 (two) times daily.            Home Infusion Instuctions  (From admission, onward)         Start     Ordered   10/31/18 0000  Home infusion instructions [facility] May follow [place] Dosing Protocol; May administer Cathflo as needed to maintain patency of vascular access device.; Flushing of vascular access device: per [facility] Protocol: 0.9% NaCl pre/post medica...    Question Answer Comment  Instructions May follow [place] Dosing Protocol   Instructions May administer Cathflo as needed to maintain patency of vascular access device.   Instructions Flushing of vascular access device: per [facility] Protocol: 0.9% NaCl pre/post medication administration and prn patency; Heparin 100 u/ml, 57m for implanted ports and Heparin 10u/ml, 518mfor all other central venous catheters.   Instructions May follow [facility] Anaphylaxis Protocol for First Dose Administration in the home: 0.9% NaCl at 25-50 ml/hr to maintain IV  access for protocol meds. Epinephrine 0.3 ml IV/IM PRN and Benadryl 25-50 IV/IM PRN s/s of anaphylaxis.   Instructions [facility] Infusion  Coordinator (RN) to assist per patient IV care needs in the home PRN.      10/31/18 1240         Follow-up Information    [place] [facility] Follow up.   Specialty:  Cardiology Why:  11/12/2018 @ 9:30AM, wound check visit Contact information: [address], [address] [place]       Deboraha Sprang, MD Follow up.   Specialty:  Cardiology Why:  01/30/2019 @ 1:45PM Contact information: [address] [place] [address] [phone]          Allergies  Allergen Reactions  . Morphine And Related     Had "crazy dreams" felt "crazy" per pt.  . Bactrim [Sulfamethoxazole-Trimethoprim]   . Statins     Consultations:  Infectious Disease  Cardiology/EP   Procedures/Studies: Dg Chest 2 View  Result Date: 10/26/2018 CLINICAL DATA:  78 year old male with cardiac device in C2. EXAM: CHEST - 2 VIEW COMPARISON:  Chest x-ray 10/24/2018. FINDINGS: Lung volumes are slightly low. Bibasilar areas of scarring, similar to prior examination. No acute consolidative airspace disease. No pleural effusions. No evidence of pulmonary edema. Heart size appears mildly enlarged. Upper mediastinal contours are within normal limits. Aortic atherosclerosis. Left-sided pacemaker device with lead tip projecting over the expected location of the right ventricular apex. Previously noted right atrial lead has been removed. IMPRESSION: 1. Interval revision of left pacemaker, as above. No radiographic evidence of acute cardiopulmonary disease. Electronically Signed   By: Vinnie Langton M.D.   On: 10/26/2018 08:03   Dg Chest 2 View  Result Date: 10/24/2018 CLINICAL DATA:  Hypoxia EXAM: CHEST - 2 VIEW COMPARISON:  10/22/2018 FINDINGS: Cardiac shadow is enlarged but stable. Pacing device is  again seen and stable. The lungs are well aerated without focal infiltrate. No sizable effusion is noted. No acute bony abnormality is seen. IMPRESSION: No acute abnormality noted.  No change from the previous exam. Electronically Signed   By: Inez Catalina M.D.   On: 10/24/2018 15:30   Ct Angio Chest Pe W And/or Wo Contrast  Result Date: 10/22/2018 CLINICAL DATA:  Shortness of breath, chest tightness EXAM: CT ANGIOGRAPHY CHEST WITH CONTRAST TECHNIQUE: Multidetector CT imaging of the chest was performed using the standard protocol during bolus administration of intravenous contrast. Multiplanar CT image reconstructions and MIPs were obtained to evaluate the vascular anatomy. CONTRAST:  110m OMNIPAQUE IOHEXOL 350 MG/ML SOLN COMPARISON:  None. FINDINGS: Cardiovascular: Satisfactory opacification of the pulmonary arteries to the segmental level. No evidence of pulmonary embolism. Cardiomegaly. Extensive 3 vessel coronary artery calcifications and/or stents. Left chest multi lead pacer. No pericardial effusion. Mediastinum/Nodes: No enlarged mediastinal, hilar, or axillary lymph nodes. Thyroid gland, trachea, and esophagus demonstrate no significant findings. Lungs/Pleura: Bibasilar scarring or atelectasis. No pleural effusion or pneumothorax. Upper Abdomen: No acute abnormality. Postoperative findings of splenectomy. Musculoskeletal: No chest wall abnormality. No acute or significant osseous findings. Review of the MIP images confirms the above findings. IMPRESSION: 1.  Negative examination for pulmonary embolism. 2.  Bibasilar scarring or atelectasis. 3.  Cardiomegaly and coronary artery disease. Electronically Signed   By: AEddie CandleM.D.   On: 10/22/2018 16:14   Mr Thoracic Spine Wo Contrast  Result Date: 10/23/2018 CLINICAL DATA:  Initial evaluation for acute sepsis. EXAM: MRI THORACIC AND LUMBAR SPINE WITHOUT CONTRAST TECHNIQUE: Multiplanar and multiecho pulse sequences of the thoracic and lumbar spine  were obtained without intravenous contrast. COMPARISON:  None  available. FINDINGS: MRI THORACIC SPINE FINDINGS Alignment: Market dextroscoliosis of the midthoracic spine. Grade 1 anterolisthesis of T2 on T3, likely chronic and degenerative. Alignment otherwise normal with preservation of the normal thoracic kyphosis. Vertebrae: Vertebral body heights maintained without evidence for acute or chronic fracture. Few scattered chronic endplate Schmorl's nodes noted within the lower thoracic spine. Bone marrow signal intensity within normal limits. No discrete or worrisome osseous lesions. No evidence for osteomyelitis discitis or septic arthritis. Cord: Signal intensity within the thoracic spinal cord is normal. Normal cord caliber and morphology. No epidural collections. Paraspinal and other soft tissues: Paraspinous soft tissues demonstrate no acute finding. Small layering right pleural effusion with associated atelectasis. Visualized visceral structures grossly within normal limits. Disc levels: T1-2: Bilateral facet hypertrophy, greater on the right. No significant stenosis. T2-3: Grade 1 anterolisthesis. Bilateral facet hypertrophy. No spinal stenosis. Mild right foraminal narrowing. T3-4:  Negative interspace.  Mild facet hypertrophy.  No stenosis. T4-5: Negative interspace. Right greater than left facet hypertrophy. No significant stenosis. T5-6:  Negative interspace.  Mild facet hypertrophy.  No stenosis. T6-7: Negative interspace. Mild facet and ligament flavum hypertrophy. No significant stenosis. T7-8:  Negative interspace.  Mild facet hypertrophy.  No stenosis. T8-9:  Mild facet hypertrophy.  No stenosis. T9-10: Bilateral facet hypertrophy with ankylosis. No significant stenosis. T10-11:  Posterior element hypertrophy.  No stenosis. T11-12:  Bilateral facet hypertrophy.  No stenosis. T12-L1:  Negative. MRI LUMBAR SPINE FINDINGS Segmentation: Transitional lumbosacral anatomy with partial sacralization of the  L5 vertebral body. L5-S1 disc somewhat rudimentary. Alignment: Trace 2 mm anterolisthesis of L3 on L4. Mild levoscoliosis. Alignment otherwise normal with preservation of the normal lumbar lordosis. Vertebrae: Vertebral body heights maintained without evidence for acute or chronic fracture. Bone marrow signal intensity mildly heterogeneous but within normal limits. No worrisome osseous lesions. Minimal reactive marrow edema about the bilateral facets at L2-3 through L4-5, likely due to facet arthritis, greater on the right. No evidence for osteomyelitis or discitis. Partially visualized SI joints within normal limits. Conus medullaris and cauda equina: Conus extends to the L1 level. Conus and cauda equina appear normal. Paraspinal and other soft tissues: Mild edema seen involving the right greater than left lower posterior paraspinous musculature (series 27, image 5), likely reactive in nature due to adjacent facet degeneration. Possible muscular strain and/or injury or acute myositis could also be considered. No loculated collections. Paraspinous soft tissues demonstrate no other acute finding. Approximate 1 cm right renal cyst noted. Atherosclerotic change noted within the intra-abdominal aorta. Visualized visceral structures otherwise unremarkable. Disc levels: L1-2:  Unremarkable. L2-3: Mild disc bulge with disc desiccation. Moderate right with mild left facet hypertrophy. Superimposed 14 mm synovial cyst noted at the lateral aspect of the right L2-3 facet (series 29, image 10). This exhibits no mass effect on the adjacent thecal sac. Thecal sac. Resultant mild canal with right lateral recess narrowing. Mild right L2 foraminal stenosis. L3-4: Trace anterolisthesis. Mild disc bulge with disc desiccation. Moderate bilateral facet hypertrophy. Resultant mild canal with bilateral lateral recess stenosis, slightly worse on the right. Mild bilateral foraminal narrowing. L4-5: Disc desiccation with minimal annular  bulge. Moderate facet hypertrophy. No significant stenosis. L5-S1: Transitional lumbosacral anatomy with rudimentary L5-S1 disc. Minimal endplate osteophytic spurring without significant disc bulge. Mild facet hypertrophy. No significant stenosis. IMPRESSION: MR THORACIC SPINE IMPRESSION 1. No evidence for acute infection or other abnormality within the thoracic spine. 2. Moderate thoracic dextroscoliosis with associated multilevel facet hypertrophy as above. No significant spinal stenosis. Mild right  foraminal narrowing at T2-3. 3. Small layering right pleural effusion with associated atelectasis. MR LUMBAR SPINE IMPRESSION 1. Mild paraspinous edema involving the right greater than left lower posterior paraspinous musculature, favored to be reactive in nature due to adjacent facet degeneration. Finding could result in right-sided lower back pain. Sequelae of muscular injury and/or strain or possibly myositis could also be considered. No discrete soft tissue collections. 2. No other evidence for acute infection within the lumbar spine. No evidence for osteomyelitis or discitis. No epidural collections. 3. Mild for age multilevel disc bulging with moderate facet hypertrophy at L2-3 through L4-5 as above. Resultant mild canal with bilateral lateral recess narrowing at L2-3 and L3-4. Electronically Signed   By: Jeannine Boga M.D.   On: 10/23/2018 19:23   Mr Lumbar Spine Wo Contrast  Result Date: 10/23/2018 CLINICAL DATA:  Initial evaluation for acute sepsis. EXAM: MRI THORACIC AND LUMBAR SPINE WITHOUT CONTRAST TECHNIQUE: Multiplanar and multiecho pulse sequences of the thoracic and lumbar spine were obtained without intravenous contrast. COMPARISON:  None available. FINDINGS: MRI THORACIC SPINE FINDINGS Alignment: Market dextroscoliosis of the midthoracic spine. Grade 1 anterolisthesis of T2 on T3, likely chronic and degenerative. Alignment otherwise normal with preservation of the normal thoracic kyphosis.  Vertebrae: Vertebral body heights maintained without evidence for acute or chronic fracture. Few scattered chronic endplate Schmorl's nodes noted within the lower thoracic spine. Bone marrow signal intensity within normal limits. No discrete or worrisome osseous lesions. No evidence for osteomyelitis discitis or septic arthritis. Cord: Signal intensity within the thoracic spinal cord is normal. Normal cord caliber and morphology. No epidural collections. Paraspinal and other soft tissues: Paraspinous soft tissues demonstrate no acute finding. Small layering right pleural effusion with associated atelectasis. Visualized visceral structures grossly within normal limits. Disc levels: T1-2: Bilateral facet hypertrophy, greater on the right. No significant stenosis. T2-3: Grade 1 anterolisthesis. Bilateral facet hypertrophy. No spinal stenosis. Mild right foraminal narrowing. T3-4:  Negative interspace.  Mild facet hypertrophy.  No stenosis. T4-5: Negative interspace. Right greater than left facet hypertrophy. No significant stenosis. T5-6:  Negative interspace.  Mild facet hypertrophy.  No stenosis. T6-7: Negative interspace. Mild facet and ligament flavum hypertrophy. No significant stenosis. T7-8:  Negative interspace.  Mild facet hypertrophy.  No stenosis. T8-9:  Mild facet hypertrophy.  No stenosis. T9-10: Bilateral facet hypertrophy with ankylosis. No significant stenosis. T10-11:  Posterior element hypertrophy.  No stenosis. T11-12:  Bilateral facet hypertrophy.  No stenosis. T12-L1:  Negative. 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 L5-S1 disc somewhat rudimentary. Alignment: Trace 2 mm anterolisthesis of L3 on L4. Mild levoscoliosis. Alignment otherwise normal with preservation of the normal lumbar lordosis. Vertebrae: Vertebral body heights maintained without evidence for acute or chronic fracture. Bone marrow signal intensity  mildly heterogeneous but within normal limits. No worrisome osseous lesions. Minimal reactive marrow edema about the bilateral facets at L2-3 through L4-5, likely due to facet arthritis, greater on the right. No evidence for osteomyelitis or discitis. Partially visualized SI joints within normal limits. Conus medullaris and cauda equina: Conus extends to the L1 level. Conus and cauda equina appear normal. Paraspinal and other soft tissues: Mild edema seen involving the right greater than left lower posterior paraspinous musculature (series 27, image 5), likely reactive in nature due to adjacent facet degeneration. Possible muscular strain and/or injury or acute myositis could also be considered. No loculated collections. Paraspinous soft tissues demonstrate no other acute finding. Approximate 1 cm right  renal cyst noted. Atherosclerotic change noted within the intra-abdominal aorta. Visualized visceral structures otherwise unremarkable. Disc levels: L1-2:  Unremarkable. L2-3: Mild disc bulge with disc desiccation. Moderate right with mild left facet hypertrophy. Superimposed 14 mm synovial cyst noted at the lateral aspect of the right L2-3 facet (series 29, image 10). This exhibits no mass effect on the adjacent thecal sac. Thecal sac. Resultant mild canal with right lateral recess narrowing. Mild right L2 foraminal stenosis. L3-4: Trace anterolisthesis. Mild disc bulge with disc desiccation. Moderate bilateral facet hypertrophy. Resultant mild canal with bilateral lateral recess stenosis, slightly worse on the right. Mild bilateral foraminal narrowing. L4-5: Disc desiccation with minimal annular bulge. Moderate facet hypertrophy. No significant stenosis. L5-S1: Transitional lumbosacral anatomy with rudimentary L5-S1 disc. Minimal endplate osteophytic spurring without significant disc bulge. Mild facet hypertrophy. No significant stenosis. IMPRESSION: MR THORACIC SPINE IMPRESSION 1. No evidence for acute infection  or other abnormality within the thoracic spine. 2. Moderate thoracic dextroscoliosis with associated multilevel facet hypertrophy as above. No significant spinal stenosis. Mild right foraminal narrowing at T2-3. 3. Small layering right pleural effusion with associated atelectasis. MR LUMBAR SPINE IMPRESSION 1. Mild paraspinous edema involving the right greater than left lower posterior paraspinous musculature, favored to be reactive in nature due to adjacent facet degeneration. Finding could result in right-sided lower back pain. Sequelae of muscular injury and/or strain or possibly myositis could also be considered. No discrete soft tissue collections. 2. No other evidence for acute infection within the lumbar spine. No evidence for osteomyelitis or discitis. No epidural collections. 3. Mild for age multilevel disc bulging with moderate facet hypertrophy at L2-3 through L4-5 as above. Resultant mild canal with bilateral lateral recess narrowing at L2-3 and L3-4. Electronically Signed   By: Jeannine Boga M.D.   On: 10/23/2018 19:23   Dg Chest Portable 1 View  Result Date: 10/22/2018 CLINICAL DATA:  Dyspnea and shortness of breath EXAM: PORTABLE CHEST 1 VIEW COMPARISON:  08/13/2018 FINDINGS: Cardiac shadow is enlarged but accentuated by the portable technique. Pacing device is noted. No focal infiltrate or sizable effusion is seen. No acute bony abnormality is noted. IMPRESSION: No acute abnormality noted. Electronically Signed   By: Inez Catalina M.D.   On: 10/22/2018 11:48   [place] Ekg Site Rite  Result Date: 10/28/2018 If Site Rite image not attached, placement could not be confirmed due to current cardiac rhythm.  [place] Ekg Site Rite  Result Date: 10/26/2018 If [facility] image not attached, placement could not be confirmed due to current cardiac rhythm.   (Echo, Carotid, EGD, Colonoscopy, ERCP)    Subjective: "I'll be glad when I can get home."  Discharge Exam: Vitals:   10/30/18 1500  10/30/18 2052  BP: (!) 161/89 (!) 194/98  Pulse: 60 (!) 59  Resp:  18  Temp:  (!) 97.5 F (36.4 C)  SpO2:  96%   Vitals:   10/30/18 1400 10/30/18 1430 10/30/18 1500 10/30/18 2052  BP: (!) 185/96 (!) 136/97 (!) 161/89 (!) 194/98  Pulse: 60 (!) 59 60 (!) 59  Resp:    18  Temp:    (!) 97.5 F (36.4 C)  TempSrc:    Oral  SpO2:    96%  Weight:      Height:        General exam: 78 y.o. male Appears calm and comfortable  Respiratory system: Clear to auscultation. Respiratory effort normal. Cardiovascular system: S1 & S2 heard, RRR. No JVD, murmurs, rubs, gallops or clicks. No  pedal edema. Gastrointestinal system: Abdomen is nondistended, soft and nontender. No organomegaly or masses felt. Normal bowel sounds heard. Central nervous system: Alert and oriented. No focal neurological deficits. Extremities: Symmetric 5 x 5 power. Skin: No rashes, lesions or ulcers     The results of significant diagnostics from this hospitalization (including imaging, microbiology, ancillary and laboratory) are listed below for reference.     Microbiology: Recent Results (from the past 240 hour(s))  SARS Coronavirus 2 (CEPHEID- Performed in [place] hospital lab), Hosp Order     Status: None   Collection Time: 10/22/18 10:47 AM  Result Value Ref Range Status   SARS Coronavirus 2 NEGATIVE NEGATIVE Final    Comment: (NOTE) If result is NEGATIVE SARS-CoV-2 target nucleic acids are NOT DETECTED. The SARS-CoV-2 RNA is generally detectable in upper and lower  respiratory specimens during the acute phase of infection. The lowest  concentration of SARS-CoV-2 viral copies this assay can detect is 250  copies / mL. A negative result does not preclude SARS-CoV-2 infection  and should not be used as the sole basis for treatment or other  patient management decisions.  A negative result may occur with  improper specimen collection / handling, submission of specimen other  than nasopharyngeal swab,  presence of viral mutation(s) within the  areas targeted by this assay, and inadequate number of viral copies  (<250 copies / mL). A negative result must be combined with clinical  observations, patient history, and epidemiological information. If result is POSITIVE SARS-CoV-2 target nucleic acids are DETECTED. The SARS-CoV-2 RNA is generally detectable in upper and lower  respiratory specimens dur ing the acute phase of infection.  Positive  results are indicative of active infection with SARS-CoV-2.  Clinical  correlation with patient history and other diagnostic information is  necessary to determine patient infection status.  Positive results do  not rule out bacterial infection or co-infection with other viruses. If result is PRESUMPTIVE POSTIVE SARS-CoV-2 nucleic acids MAY BE PRESENT.   A presumptive positive result was obtained on the submitted specimen  and confirmed on repeat testing.  While 2019 novel coronavirus  (SARS-CoV-2) nucleic acids may be present in the submitted sample  additional confirmatory testing may be necessary for epidemiological  and / or clinical management purposes  to differentiate between  SARS-CoV-2 and other Sarbecovirus currently known to infect humans.  If clinically indicated additional testing with an alternate test  methodology [phone] is advised. The SARS-CoV-2 RNA is generally  detectable in upper and lower respiratory sp ecimens during the acute  phase of infection. The expected result is Negative. Fact Sheet for Patients:  StrictlyIdeas.no Fact Sheet for Healthcare Providers: BankingDealers.co.za This test is not yet approved or cleared by the [place] FDA and has been authorized for detection and/or diagnosis of SARS-CoV-2 by FDA under an Emergency Use Authorization (EUA).  This EUA will remain in effect (meaning this test can be used) for the duration of the COVID-19 declaration under  Section 564(b)(1) of the Act, 21 U.S.C. section 360bbb-3(b)(1), unless the authorization is terminated or revoked sooner. Performed at [facility], [place] [address], [place], [place] [address]   Blood culture (routine x 2)     Status: Abnormal   Collection Time: 10/22/18 10:52 AM  Result Value Ref Range Status   Specimen Description BLOOD BLOOD LEFT FOREARM  Final   Special Requests   Final    BOTTLES DRAWN AEROBIC AND ANAEROBIC Blood Culture adequate volume   Culture  Setup Time   Final    IN BOTH AEROBIC AND ANAEROBIC BOTTLES GRAM POSITIVE COCCI CRITICAL RESULT CALLED TO, READ BACK BY AND VERIFIED WITH: J ORIET PHARMD 10/22/18 2300 JDW    Culture (A)  Final    STREPTOCOCCUS GALLOLYTICUS SUSCEPTIBILITIES PERFORMED ON PREVIOUS CULTURE WITHIN THE LAST 5 DAYS. Performed at [facility], [place] [address], [place], [place] [address]    Report Status 10/24/2018 FINAL  Final  Blood Culture ID Panel (Reflexed)     Status: Abnormal   Collection Time: 10/22/18 10:52 AM  Result Value Ref Range Status   Enterococcus species NOT DETECTED NOT DETECTED Final   Listeria monocytogenes NOT DETECTED NOT DETECTED Final   Staphylococcus species NOT DETECTED NOT DETECTED Final   Staphylococcus aureus (BCID) NOT DETECTED NOT DETECTED Final   Streptococcus species DETECTED (A) NOT DETECTED Final    Comment: Not Enterococcus species, Streptococcus agalactiae, Streptococcus pyogenes, or Streptococcus pneumoniae. CRITICAL RESULT CALLED TO, READ BACK BY AND VERIFIED WITH: J ORIET PHARMD 10/22/18 2300 JDW    Streptococcus agalactiae NOT DETECTED NOT DETECTED Final   Streptococcus pneumoniae NOT DETECTED NOT DETECTED Final   Streptococcus pyogenes NOT DETECTED NOT DETECTED Final   Acinetobacter baumannii NOT DETECTED NOT DETECTED Final   Enterobacteriaceae species NOT DETECTED NOT DETECTED Final   Enterobacter cloacae complex NOT DETECTED NOT DETECTED Final   Escherichia coli NOT DETECTED NOT  DETECTED Final   Klebsiella oxytoca NOT DETECTED NOT DETECTED Final   Klebsiella pneumoniae NOT DETECTED NOT DETECTED Final   Proteus species NOT DETECTED NOT DETECTED Final   Serratia marcescens NOT DETECTED NOT DETECTED Final   Haemophilus influenzae NOT DETECTED NOT DETECTED Final   Neisseria meningitidis NOT DETECTED NOT DETECTED Final   Pseudomonas aeruginosa NOT DETECTED NOT DETECTED Final   Candida albicans NOT DETECTED NOT DETECTED Final   Candida glabrata NOT DETECTED NOT DETECTED Final   Candida krusei NOT DETECTED NOT DETECTED Final   Candida parapsilosis NOT DETECTED NOT DETECTED Final   Candida tropicalis NOT DETECTED NOT DETECTED Final    Comment: Performed at [facility], [place] [address] [place], [place] [address]  Urine culture     Status: Abnormal   Collection Time: 10/22/18 11:22 AM  Result Value Ref Range Status   Specimen Description URINE, RANDOM  Final   Special Requests NONE  Final   Culture (A)  Final    <10,000 COLONIES/mL INSIGNIFICANT GROWTH Performed at [place] [address] [place], [place] [address]    Report Status 10/23/2018 FINAL  Final  Blood culture (routine x 2)     Status: Abnormal   Collection Time: 10/22/18 11:23 AM  Result Value Ref Range Status   Specimen Description BLOOD RIGHT ANTECUBITAL  Final   Special Requests   Final    BOTTLES DRAWN AEROBIC AND ANAEROBIC Blood Culture adequate volume   Culture  Setup Time   Final    IN BOTH AEROBIC AND ANAEROBIC BOTTLES GRAM POSITIVE COCCI CRITICAL VALUE NOTED.  VALUE IS CONSISTENT WITH PREVIOUSLY REPORTED AND CALLED VALUE. Performed at [facility], [place] [address], [place],  [address]    Culture STREPTOCOCCUS GALLOLYTICUS (A)  Final   Report Status 10/24/2018 FINAL  Final   Organism ID, Bacteria STREPTOCOCCUS GALLOLYTICUS  Final      Susceptibility   Streptococcus gallolyticus - MIC*    PENICILLIN 0.12 SENSITIVE Sensitive     CEFTRIAXONE <=0.12 SENSITIVE  Sensitive     ERYTHROMYCIN <=  0.12 SENSITIVE Sensitive     LEVOFLOXACIN 2 SENSITIVE Sensitive     VANCOMYCIN <=0.12 SENSITIVE Sensitive     * STREPTOCOCCUS GALLOLYTICUS  Respiratory Panel by PCR     Status: None   Collection Time: 10/22/18  5:44 PM  Result Value Ref Range Status   Adenovirus NOT DETECTED NOT DETECTED Final   Coronavirus 229E NOT DETECTED NOT DETECTED Final    Comment: (NOTE) The Coronavirus on the Respiratory Panel, DOES NOT test for the novel  Coronavirus (2019 nCoV)    Coronavirus HKU1 NOT DETECTED NOT DETECTED Final   Coronavirus NL63 NOT DETECTED NOT DETECTED Final   Coronavirus OC43 NOT DETECTED NOT DETECTED Final   Metapneumovirus NOT DETECTED NOT DETECTED Final   Rhinovirus / Enterovirus NOT DETECTED NOT DETECTED Final   Influenza A NOT DETECTED NOT DETECTED Final   Influenza B NOT DETECTED NOT DETECTED Final   Parainfluenza Virus 1 NOT DETECTED NOT DETECTED Final   Parainfluenza Virus 2 NOT DETECTED NOT DETECTED Final   Parainfluenza Virus 3 NOT DETECTED NOT DETECTED Final   Parainfluenza Virus 4 NOT DETECTED NOT DETECTED Final   Respiratory Syncytial Virus NOT DETECTED NOT DETECTED Final   Bordetella pertussis NOT DETECTED NOT DETECTED Final   Chlamydophila pneumoniae NOT DETECTED NOT DETECTED Final   Mycoplasma pneumoniae NOT DETECTED NOT DETECTED Final    Comment: Performed at [facility], [place]. [address], [place],  [address]  SARS Coronavirus 2 (CEPHEID- Performed in [place] hospital lab), Hosp Order     Status: None   Collection Time: 10/22/18  5:44 PM  Result Value Ref Range Status   SARS Coronavirus 2 NEGATIVE NEGATIVE Final    Comment: (NOTE) If result is NEGATIVE SARS-CoV-2 target nucleic acids are NOT DETECTED. The SARS-CoV-2 RNA is generally detectable in upper and lower  respiratory specimens during the acute phase of infection. The lowest  concentration of SARS-CoV-2 viral copies this assay can detect is 250  copies / mL. A  negative result does not preclude SARS-CoV-2 infection  and should not be used as the sole basis for treatment or other  patient management decisions.  A negative result may occur with  improper specimen collection / handling, submission of specimen other  than nasopharyngeal swab, presence of viral mutation(s) within the  areas targeted by this assay, and inadequate number of viral copies  (<250 copies / mL). A negative result must be combined with clinical  observations, patient history, and epidemiological information. If result is POSITIVE SARS-CoV-2 target nucleic acids are DETECTED. The SARS-CoV-2 RNA is generally detectable in upper and lower  respiratory specimens dur ing the acute phase of infection.  Positive  results are indicative of active infection with SARS-CoV-2.  Clinical  correlation with patient history and other diagnostic information is  necessary to determine patient infection status.  Positive results do  not rule out bacterial infection or co-infection with other viruses. If result is PRESUMPTIVE POSTIVE SARS-CoV-2 nucleic acids MAY BE PRESENT.   A presumptive positive result was obtained on the submitted specimen  and confirmed on repeat testing.  While 2019 novel coronavirus  (SARS-CoV-2) nucleic acids may be present in the submitted sample  additional confirmatory testing may be necessary for epidemiological  and / or clinical management purposes  to differentiate between  SARS-CoV-2 and other Sarbecovirus currently known to infect humans.  If clinically indicated additional testing with an alternate test  methodology [phone] is advised. The SARS-CoV-2 RNA is generally  detectable in  upper and lower respiratory sp ecimens during the acute  phase of infection. The expected result is Negative. Fact Sheet for Patients:  StrictlyIdeas.no Fact Sheet for Healthcare Providers: BankingDealers.co.za This test is not  yet approved or cleared by the [place] FDA and has been authorized for detection and/or diagnosis of SARS-CoV-2 by FDA under an Emergency Use Authorization (EUA).  This EUA will remain in effect (meaning this test can be used) for the duration of the COVID-19 declaration under Section 564(b)(1) of the Act, 21 U.S.C. section 360bbb-3(b)(1), unless the authorization is terminated or revoked sooner. Performed at [facility], [place] [address], [place], [place] [address]   Urine culture     Status: None   Collection Time: 10/22/18  8:29 PM  Result Value Ref Range Status   Specimen Description URINE, RANDOM  Final   Special Requests NONE  Final   Culture   Final    NO GROWTH Performed at [facility], [place] [address], [place], [place] [address]    Report Status 10/24/2018 FINAL  Final  Culture, blood (routine x 2)     Status: None   Collection Time: 10/23/18  7:45 AM  Result Value Ref Range Status   Specimen Description BLOOD LEFT ANTECUBITAL  Final   Special Requests   Final    BOTTLES DRAWN AEROBIC ONLY Blood Culture adequate volume   Culture   Final    NO GROWTH 5 DAYS Performed at [facility], [place] [address], [place], [place] [address]    Report Status 10/28/2018 FINAL  Final  Culture, blood (routine x 2)     Status: None   Collection Time: 10/23/18  7:53 AM  Result Value Ref Range Status   Specimen Description BLOOD RIGHT HAND  Final   Special Requests   Final    BOTTLES DRAWN AEROBIC ONLY Blood Culture adequate volume   Culture   Final    NO GROWTH 5 DAYS Performed at [facility], [place] [address], [place], [place] [address]    Report Status 10/28/2018 FINAL  Final  Culture, blood (routine x 2)     Status: None (Preliminary result)   Collection Time: 10/26/18  7:07 AM  Result Value Ref Range Status   Specimen Description BLOOD LEFT ANTECUBITAL  Final   Special Requests   Final    BOTTLES DRAWN AEROBIC AND ANAEROBIC Blood Culture adequate  volume   Culture   Final    NO GROWTH 4 DAYS Performed at [facility], [place] [address] [place], [place] [address]    Report Status PENDING  Incomplete  Culture, blood (routine x 2)     Status: None (Preliminary result)   Collection Time: 10/26/18  7:21 AM  Result Value Ref Range Status   Specimen Description BLOOD BLOOD LEFT HAND  Final   Special Requests   Final    BOTTLES DRAWN AEROBIC AND ANAEROBIC Blood Culture adequate volume   Culture   Final    NO GROWTH 4 DAYS Performed at [facility], [place] [address], [place], [place] [address]    Report Status PENDING  Incomplete  Culture, Urine     Status: None   Collection Time: 10/28/18 11:03 AM  Result Value Ref Range Status   Specimen Description URINE, RANDOM  Final   Special Requests NONE  Final   Culture   Final    NO GROWTH Performed at [facility], [place]  [address] [place], [place] [address]    Report Status 10/29/2018 FINAL  Final  Surgical PCR screen     Status: None   Collection Time: 10/29/18  6:01 PM  Result Value Ref Range Status   MRSA, PCR NEGATIVE NEGATIVE Final   Staphylococcus aureus NEGATIVE NEGATIVE Final    Comment: (NOTE) The Xpert SA Assay (FDA approved for NASAL specimens in patients 84 years of age and older), is one component of a comprehensive surveillance program. It is not intended to diagnose infection nor to guide or monitor treatment. Performed at [facility], [place] [address], [place], [place] [address]      Labs: BNP (last 3 results) Recent Labs    10/22/18 1051  BNP 43.3   Basic Metabolic Panel: Recent Labs  Lab 10/25/18 0430 10/28/18 0540 10/29/18 0500 10/30/18 0410  NA 142 141 142 141  K 4.1 3.8 4.1 4.2  CL 110 106 108 106  CO2 _0 GLUCOSE 108* 110* 110* 110*  BUN _1 CREATININE 1.17 1.02 1.16 1.04  CALCIUM 9.6 9.4 9.7 9.9  MG 1.9  --  1.9 1.9   Liver Function Tests: Recent Labs  Lab 10/25/18 0430 10/29/18 0500  10/30/18 0410  AST _2 ALT 29 39 33  ALKPHOS 53 53 55  BILITOT 1.0 0.7 0.6  PROT 5.7* 6.4* 6.4*  ALBUMIN 2.8* 3.1* 3.1*   No results for input(s): LIPASE, AMYLASE in the last 168 hours. No results for input(s): AMMONIA in the last 168 hours. CBC: Recent Labs  Lab 10/26/18 0707 10/28/18 0540 10/29/18 0500 10/30/18 0410 10/31/18 0435  WBC 9.6 8.0 9.0 8.8 8.4  NEUTROABS  --  3.6  --   --  5.0  HGB 16.0 16.2 16.5 16.8 15.4  HCT 46.8 48.1 48.6 48.9 46.3  MCV 91.6 91.3 91.9 91.1 92.8  PLT 177 221 267 278 317   Cardiac Enzymes: No results for input(s): CKTOTAL, CKMB, CKMBINDEX, TROPONINI in the last 168 hours. BNP: Invalid input(s): POCBNP CBG: No results for input(s): GLUCAP in the last 168 hours. D-Dimer No results for input(s): DDIMER in the last 72 hours. Hgb A1c No results for input(s): HGBA1C in the last 72 hours. Lipid Profile No results for input(s): CHOL, HDL, LDLCALC, TRIG, CHOLHDL, LDLDIRECT in the last 72 hours. Thyroid function studies No results for input(s): TSH, T4TOTAL, T3FREE, THYROIDAB in the last 72 hours.  Invalid input(s): FREET3 Anemia work up No results for input(s): VITAMINB12, FOLATE, FERRITIN, TIBC, IRON, RETICCTPCT in the last 72 hours. Urinalysis    Component Value Date/Time   COLORURINE YELLOW 10/28/2018 1117   APPEARANCEUR CLEAR 10/28/2018 1117   LABSPEC 1.013 10/28/2018 1117   PHURINE 6.0 10/28/2018 1117   GLUCOSEU NEGATIVE 10/28/2018 1117   GLUCOSEU NEGATIVE 06/22/2017 1116   [place] 10/28/2018 1117   [place] 10/28/2018 1117   [place] 10/28/2018 1117   PROTEINUR 30 (A) 10/28/2018 1117   UROBILINOGEN 0.2 06/22/2017 1116   NITRITE NEGATIVE 10/28/2018 1117   LEUKOCYTESUR NEGATIVE 10/28/2018 1117   Sepsis Labs Invalid input(s): PROCALCITONIN,  WBC,  LACTICIDVEN Microbiology Recent Results (from the past 240 hour(s))  SARS Coronavirus 2 (CEPHEID- Performed in [place] hospital lab), Hosp  Order     Status: None   Collection Time: 10/22/18 10:47 AM  Result Value Ref Range Status   SARS Coronavirus 2 NEGATIVE NEGATIVE Final    Comment: (NOTE) If result is NEGATIVE SARS-CoV-2 target nucleic  acids are NOT DETECTED. The SARS-CoV-2 RNA is generally detectable in upper and lower  respiratory specimens during the acute phase of infection. The lowest  concentration of SARS-CoV-2 viral copies this assay can detect is 250  copies / mL. A negative result does not preclude SARS-CoV-2 infection  and should not be used as the sole basis for treatment or other  patient management decisions.  A negative result may occur with  improper specimen collection / handling, submission of specimen other  than nasopharyngeal swab, presence of viral mutation(s) within the  areas targeted by this assay, and inadequate number of viral copies  (<250 copies / mL). A negative result must be combined with clinical  observations, patient history, and epidemiological information. If result is POSITIVE SARS-CoV-2 target nucleic acids are DETECTED. The SARS-CoV-2 RNA is generally detectable in upper and lower  respiratory specimens dur ing the acute phase of infection.  Positive  results are indicative of active infection with SARS-CoV-2.  Clinical  correlation with patient history and other diagnostic information is  necessary to determine patient infection status.  Positive results do  not rule out bacterial infection or co-infection with other viruses. If result is PRESUMPTIVE POSTIVE SARS-CoV-2 nucleic acids MAY BE PRESENT.   A presumptive positive result was obtained on the submitted specimen  and confirmed on repeat testing.  While 2019 novel coronavirus  (SARS-CoV-2) nucleic acids may be present in the submitted sample  additional confirmatory testing may be necessary for epidemiological  and / or clinical management purposes  to differentiate between  SARS-CoV-2 and other Sarbecovirus currently  known to infect humans.  If clinically indicated additional testing with an alternate test  methodology [phone] is advised. The SARS-CoV-2 RNA is generally  detectable in upper and lower respiratory sp ecimens during the acute  phase of infection. The expected result is Negative. Fact Sheet for Patients:  StrictlyIdeas.no Fact Sheet for Healthcare Providers: BankingDealers.co.za This test is not yet approved or cleared by the [place] FDA and has been authorized for detection and/or diagnosis of SARS-CoV-2 by FDA under an Emergency Use Authorization (EUA).  This EUA will remain in effect (meaning this test can be used) for the duration of the COVID-19 declaration under Section 564(b)(1) of the Act, 21 U.S.C. section 360bbb-3(b)(1), unless the authorization is terminated or revoked sooner. Performed at [facility], [place] [address] [place], [place] [address]   Blood culture (routine x 2)     Status: Abnormal   Collection Time: 10/22/18 10:52 AM  Result Value Ref Range Status   Specimen Description BLOOD BLOOD LEFT FOREARM  Final   Special Requests   Final    BOTTLES DRAWN AEROBIC AND ANAEROBIC Blood Culture adequate volume   Culture  Setup Time   Final    IN BOTH AEROBIC AND ANAEROBIC BOTTLES GRAM POSITIVE COCCI CRITICAL RESULT CALLED TO, READ BACK BY AND VERIFIED WITH: J ORIET PHARMD 10/22/18 2300 JDW    Culture (A)  Final    STREPTOCOCCUS GALLOLYTICUS SUSCEPTIBILITIES PERFORMED ON PREVIOUS CULTURE WITHIN THE LAST 5 DAYS. Performed at [facility], [place] [address], [place],  [address]    Report Status 10/24/2018 FINAL  Final  Blood Culture ID Panel (Reflexed)     Status: Abnormal   Collection Time: 10/22/18 10:52 AM  Result Value Ref Range Status   Enterococcus species NOT DETECTED NOT DETECTED Final   Listeria monocytogenes NOT DETECTED NOT DETECTED Final   Staphylococcus species NOT DETECTED NOT DETECTED  Final   Staphylococcus aureus (BCID) NOT DETECTED NOT DETECTED Final   Streptococcus species DETECTED (A) NOT DETECTED Final    Comment: Not Enterococcus species, Streptococcus agalactiae, Streptococcus pyogenes, or Streptococcus pneumoniae. CRITICAL RESULT CALLED TO, READ BACK BY AND VERIFIED WITH: J ORIET PHARMD 10/22/18 2300 JDW    Streptococcus agalactiae NOT DETECTED NOT DETECTED Final   Streptococcus pneumoniae NOT DETECTED NOT DETECTED Final   Streptococcus pyogenes NOT DETECTED NOT DETECTED Final   Acinetobacter baumannii NOT DETECTED NOT DETECTED Final   Enterobacteriaceae species NOT DETECTED NOT DETECTED Final   Enterobacter cloacae complex NOT DETECTED NOT DETECTED Final   Escherichia coli NOT DETECTED NOT DETECTED Final   Klebsiella oxytoca NOT DETECTED NOT DETECTED Final   Klebsiella pneumoniae NOT DETECTED NOT DETECTED Final   Proteus species NOT DETECTED NOT DETECTED Final   Serratia marcescens NOT DETECTED NOT DETECTED Final   Haemophilus influenzae NOT DETECTED NOT DETECTED Final   Neisseria meningitidis NOT DETECTED NOT DETECTED Final   Pseudomonas aeruginosa NOT DETECTED NOT DETECTED Final   Candida albicans NOT DETECTED NOT DETECTED Final   Candida glabrata NOT DETECTED NOT DETECTED Final   Candida krusei NOT DETECTED NOT DETECTED Final   Candida parapsilosis NOT DETECTED NOT DETECTED Final   Candida tropicalis NOT DETECTED NOT DETECTED Final    Comment: Performed at [facility], [place] [address] [place], [place] [address]  Urine culture     Status: Abnormal   Collection Time: 10/22/18 11:22 AM  Result Value Ref Range Status   Specimen Description URINE, RANDOM  Final   Special Requests NONE  Final   Culture (A)  Final    <10,000 COLONIES/mL INSIGNIFICANT GROWTH Performed at [place] [address] [place], [place] [address]    Report Status 10/23/2018 FINAL  Final  Blood culture (routine x 2)     Status: Abnormal   Collection Time: 10/22/18  11:23 AM  Result Value Ref Range Status   Specimen Description BLOOD RIGHT ANTECUBITAL  Final   Special Requests   Final    BOTTLES DRAWN AEROBIC AND ANAEROBIC Blood Culture adequate volume   Culture  Setup Time   Final    IN BOTH AEROBIC AND ANAEROBIC BOTTLES GRAM POSITIVE COCCI CRITICAL VALUE NOTED.  VALUE IS CONSISTENT WITH PREVIOUSLY REPORTED AND CALLED VALUE. Performed at [facility], [place] [address] [place], [place] [address]    Culture STREPTOCOCCUS GALLOLYTICUS (A)  Final   Report Status 10/24/2018 FINAL  Final   Organism ID, Bacteria STREPTOCOCCUS GALLOLYTICUS  Final      Susceptibility   Streptococcus gallolyticus - MIC*    PENICILLIN 0.12 SENSITIVE Sensitive     CEFTRIAXONE <=0.12 SENSITIVE Sensitive     ERYTHROMYCIN <=0.12 SENSITIVE Sensitive     LEVOFLOXACIN 2 SENSITIVE Sensitive     VANCOMYCIN <=0.12 SENSITIVE Sensitive     * STREPTOCOCCUS GALLOLYTICUS  Respiratory Panel by PCR     Status: None   Collection Time: 10/22/18  5:44 PM  Result Value Ref Range Status   Adenovirus NOT DETECTED NOT DETECTED Final   Coronavirus 229E NOT DETECTED NOT DETECTED Final    Comment: (NOTE) The Coronavirus on the Respiratory Panel, DOES NOT test for the novel  Coronavirus (2019 nCoV)    Coronavirus HKU1 NOT DETECTED NOT DETECTED Final   Coronavirus NL63 NOT DETECTED NOT DETECTED Final   Coronavirus OC43 NOT DETECTED NOT DETECTED Final   Metapneumovirus NOT DETECTED NOT DETECTED Final   Rhinovirus /  Enterovirus NOT DETECTED NOT DETECTED Final   Influenza A NOT DETECTED NOT DETECTED Final   Influenza B NOT DETECTED NOT DETECTED Final   Parainfluenza Virus 1 NOT DETECTED NOT DETECTED Final   Parainfluenza Virus 2 NOT DETECTED NOT DETECTED Final   Parainfluenza Virus 3 NOT DETECTED NOT DETECTED Final   Parainfluenza Virus 4 NOT DETECTED NOT DETECTED Final   Respiratory Syncytial Virus NOT DETECTED NOT DETECTED Final   Bordetella pertussis NOT DETECTED NOT DETECTED Final    Chlamydophila pneumoniae NOT DETECTED NOT DETECTED Final   Mycoplasma pneumoniae NOT DETECTED NOT DETECTED Final    Comment: Performed at [facility], [place] [address] [place], [place] [address]  SARS Coronavirus 2 (CEPHEID- Performed in [place] [facility] lab), Hosp Order     Status: None   Collection Time: 10/22/18  5:44 PM  Result Value Ref Range Status   SARS Coronavirus 2 NEGATIVE NEGATIVE Final    Comment: (NOTE) If result is NEGATIVE SARS-CoV-2 target nucleic acids are NOT DETECTED. The SARS-CoV-2 RNA is generally detectable in upper and lower  respiratory specimens during the acute phase of infection. The lowest  concentration of SARS-CoV-2 viral copies this assay can detect is 250  copies / mL. A negative result does not preclude SARS-CoV-2 infection  and should not be used as the sole basis for treatment or other  patient management decisions.  A negative result may occur with  improper specimen collection / handling, submission of specimen other  than nasopharyngeal swab, presence of viral mutation(s) within the  areas targeted by this assay, and inadequate number of viral copies  (<250 copies / mL). A negative result must be combined with clinical  observations, patient history, and epidemiological information. If result is POSITIVE SARS-CoV-2 target nucleic acids are DETECTED. The SARS-CoV-2 RNA is generally detectable in upper and lower  respiratory specimens dur ing the acute phase of infection.  Positive  results are indicative of active infection with SARS-CoV-2.  Clinical  correlation with patient history and other diagnostic information is  necessary to determine patient infection status.  Positive results do  not rule out bacterial infection or co-infection with other viruses. If result is PRESUMPTIVE POSTIVE SARS-CoV-2 nucleic acids MAY BE PRESENT.   A presumptive positive result was obtained on the submitted specimen  and confirmed on repeat testing.   While 2019 novel coronavirus  (SARS-CoV-2) nucleic acids may be present in the submitted sample  additional confirmatory testing may be necessary for epidemiological  and / or clinical management purposes  to differentiate between  SARS-CoV-2 and other Sarbecovirus currently known to infect humans.  If clinically indicated additional testing with an alternate test  methodology [phone]) is advised. The SARS-CoV-2 RNA is generally  detectable in upper and lower respiratory sp ecimens during the acute  phase of infection. The expected result is Negative. Fact Sheet for Patients:  StrictlyIdeas.no Fact Sheet for Healthcare Providers: BankingDealers.co.za This test is not yet approved or cleared by the [place] FDA and has been authorized for detection and/or diagnosis of SARS-CoV-2 by FDA under an Emergency Use Authorization (EUA).  This EUA will remain in effect (meaning this test can be used) for the duration of the COVID-19 declaration under Section 564(b)(1) of the Act, 21 U.S.C. section 360bbb-3(b)(1), unless the authorization is terminated or revoked sooner. Performed at [facility], [place] [address] [place], [place] [address]   Urine culture     Status: None   Collection Time: 10/22/18  8:29 PM  Result Value Ref Range Status   Specimen Description URINE, RANDOM  Final   Special Requests NONE  Final   Culture   Final    NO GROWTH Performed at [facility], [address], [place], [place] [address]    Report Status 10/24/2018 FINAL  Final  Culture, blood (routine x 2)     Status: None   Collection Time: 10/23/18  7:45 AM  Result Value Ref Range Status   Specimen Description BLOOD LEFT ANTECUBITAL  Final   Special Requests   Final    BOTTLES DRAWN AEROBIC ONLY Blood Culture adequate volume   Culture   Final    NO GROWTH 5 DAYS Performed at [facility], [place] [address] [place], [place] [address]     Report Status 10/28/2018 FINAL  Final  Culture, blood (routine x 2)     Status: None   Collection Time: 10/23/18  7:53 AM  Result Value Ref Range Status   Specimen Description BLOOD RIGHT HAND  Final   Special Requests   Final    BOTTLES DRAWN AEROBIC ONLY Blood Culture adequate volume   Culture   Final    NO GROWTH 5 DAYS Performed at [facility], [place] [address] [place], [place] [address]    Report Status 10/28/2018 FINAL  Final  Culture, blood (routine x 2)     Status: None (Preliminary result)   Collection Time: 10/26/18  7:07 AM  Result Value Ref Range Status   Specimen Description BLOOD LEFT ANTECUBITAL  Final   Special Requests   Final    BOTTLES DRAWN AEROBIC AND ANAEROBIC Blood Culture adequate volume   Culture   Final    NO GROWTH 4 DAYS Performed at [facility], [place] [address] [place], [place] [address]    Report Status PENDING  Incomplete  Culture, blood (routine x 2)     Status: None (Preliminary result)   Collection Time: 10/26/18  7:21 AM  Result Value Ref Range Status   Specimen Description BLOOD BLOOD LEFT HAND  Final   Special Requests   Final    BOTTLES DRAWN AEROBIC AND ANAEROBIC Blood Culture adequate volume   Culture   Final    NO GROWTH 4 DAYS Performed at [facility], [place] [address], [place], [place] [address]    Report Status PENDING  Incomplete  Culture, Urine     Status: None   Collection Time: 10/28/18 11:03 AM  Result Value Ref Range Status   Specimen Description URINE, RANDOM  Final   Special Requests NONE  Final   Culture   Final    NO GROWTH Performed at [facility], [place] [address], [place], [place] [address]    Report Status 10/29/2018 FINAL  Final  Surgical PCR screen     Status: None   Collection Time: 10/29/18  6:01 PM  Result Value Ref Range Status   MRSA, PCR NEGATIVE NEGATIVE Final   Staphylococcus aureus NEGATIVE NEGATIVE Final    Comment: (NOTE) The Xpert SA Assay (FDA approved for NASAL specimens  in patients 21 years of age and older), is one component of a comprehensive surveillance program. It is not intended to diagnose infection nor to guide or monitor treatment. Performed at [facility], [place] [address] [place], [place] [address]      Time coordinating discharge: 45 minutes spent in the coordination of this discharge.   SIGNED:  Jonnie Finner, DO  [facility] 10/31/2018, 7:02 AM Pager   If 7PM-7AM, please contact night-coverage [email or web address] Password [email or web address]

## 2018-10-31 NOTE — Discharge Instructions (Signed)
° ° °  Supplemental Discharge Instructions for  Pacemaker/Defibrillator Patients  Activity No heavy lifting or vigorous activity with your right arm for 6 to 8 weeks.  Do not raise your right arm above your head for one week.  Gradually raise your affected arm as drawn below.              11/04/2018                  11/05/2018                  11/06/2018                 11/07/2018 __  NO DRIVING for 10 days   ; you may begin driving on  07/12/784   .  WOUND CARE - Keep the wound area clean and dry.  Do not either wound area wet, no showers until cleared to at your wound check visit (and as per picc line instructions as we discussed) - The tape/steri-strips on your wound will fall off; do not pull them off.  No bandage is needed on the site.  DO  NOT apply any creams, oils, or ointments to the wound area. - If you notice any drainage or discharge from the wound, any swelling or bruising at the site, or you develop a fever > 101? F after you are discharged home, call the office at once.  Special Instructions - You are still able to use cellular telephones; use the ear opposite the side where you have your pacemaker/defibrillator.  Avoid carrying your cellular phone near your device. - When traveling through airports, show security personnel your identification card to avoid being screened in the metal detectors.  Ask the security personnel to use the hand wand. - Avoid arc welding equipment, MRI testing (magnetic resonance imaging), TENS units (transcutaneous nerve stimulators).  Call the office for questions about other devices. - Avoid electrical appliances that are in poor condition or are not properly grounded. - Microwave ovens are safe to be near or to operate.

## 2018-11-01 ENCOUNTER — Telehealth: Payer: Self-pay | Admitting: *Deleted

## 2018-11-01 DIAGNOSIS — G4730 Sleep apnea, unspecified: Secondary | ICD-10-CM | POA: Diagnosis not present

## 2018-11-01 DIAGNOSIS — I2510 Atherosclerotic heart disease of native coronary artery without angina pectoris: Secondary | ICD-10-CM | POA: Diagnosis not present

## 2018-11-01 DIAGNOSIS — I252 Old myocardial infarction: Secondary | ICD-10-CM | POA: Diagnosis not present

## 2018-11-01 DIAGNOSIS — I700 Atherosclerosis of aorta: Secondary | ICD-10-CM | POA: Diagnosis not present

## 2018-11-01 DIAGNOSIS — M545 Low back pain: Secondary | ICD-10-CM | POA: Diagnosis not present

## 2018-11-01 DIAGNOSIS — T827XXA Infection and inflammatory reaction due to other cardiac and vascular devices, implants and grafts, initial encounter: Secondary | ICD-10-CM | POA: Diagnosis not present

## 2018-11-01 DIAGNOSIS — Q211 Atrial septal defect: Secondary | ICD-10-CM | POA: Diagnosis not present

## 2018-11-01 DIAGNOSIS — A4181 Sepsis due to Enterococcus: Secondary | ICD-10-CM | POA: Diagnosis not present

## 2018-11-01 DIAGNOSIS — K219 Gastro-esophageal reflux disease without esophagitis: Secondary | ICD-10-CM | POA: Diagnosis not present

## 2018-11-01 DIAGNOSIS — R1312 Dysphagia, oropharyngeal phase: Secondary | ICD-10-CM | POA: Diagnosis not present

## 2018-11-01 DIAGNOSIS — I495 Sick sinus syndrome: Secondary | ICD-10-CM | POA: Diagnosis not present

## 2018-11-01 DIAGNOSIS — Z48812 Encounter for surgical aftercare following surgery on the circulatory system: Secondary | ICD-10-CM | POA: Diagnosis not present

## 2018-11-01 DIAGNOSIS — I130 Hypertensive heart and chronic kidney disease with heart failure and stage 1 through stage 4 chronic kidney disease, or unspecified chronic kidney disease: Secondary | ICD-10-CM | POA: Diagnosis not present

## 2018-11-01 DIAGNOSIS — N179 Acute kidney failure, unspecified: Secondary | ICD-10-CM | POA: Diagnosis not present

## 2018-11-01 DIAGNOSIS — Z452 Encounter for adjustment and management of vascular access device: Secondary | ICD-10-CM | POA: Diagnosis not present

## 2018-11-01 DIAGNOSIS — N182 Chronic kidney disease, stage 2 (mild): Secondary | ICD-10-CM | POA: Diagnosis not present

## 2018-11-01 DIAGNOSIS — E785 Hyperlipidemia, unspecified: Secondary | ICD-10-CM | POA: Diagnosis not present

## 2018-11-01 DIAGNOSIS — J449 Chronic obstructive pulmonary disease, unspecified: Secondary | ICD-10-CM | POA: Diagnosis not present

## 2018-11-01 DIAGNOSIS — I2584 Coronary atherosclerosis due to calcified coronary lesion: Secondary | ICD-10-CM | POA: Diagnosis not present

## 2018-11-01 DIAGNOSIS — J9601 Acute respiratory failure with hypoxia: Secondary | ICD-10-CM | POA: Diagnosis not present

## 2018-11-01 DIAGNOSIS — M1710 Unilateral primary osteoarthritis, unspecified knee: Secondary | ICD-10-CM | POA: Diagnosis not present

## 2018-11-01 DIAGNOSIS — K449 Diaphragmatic hernia without obstruction or gangrene: Secondary | ICD-10-CM | POA: Diagnosis not present

## 2018-11-01 DIAGNOSIS — J301 Allergic rhinitis due to pollen: Secondary | ICD-10-CM | POA: Diagnosis not present

## 2018-11-01 DIAGNOSIS — E039 Hypothyroidism, unspecified: Secondary | ICD-10-CM | POA: Diagnosis not present

## 2018-11-01 DIAGNOSIS — I509 Heart failure, unspecified: Secondary | ICD-10-CM | POA: Diagnosis not present

## 2018-11-01 NOTE — Telephone Encounter (Signed)
Tried calling pt to make hosp follow=up appt w/PCP there was no answer LMOM RTC.Marland KitchenJohny Chess

## 2018-11-01 NOTE — Telephone Encounter (Signed)
Transition Care Management Follow-up Telephone Call   Date discharged? 10/31/18   How have you been since you were released from the hospital? Pt states he seem to be doing alright   Do you understand why you were in the hospital? YES   Do you understand the discharge instructions? YES   Where were you discharged to? Home   Items Reviewed:  Medications reviewed: YES, not taking his potassium and brilinta  Allergies reviewed: YES  Dietary changes reviewed: YES  Referrals reviewed: YES, pt states he have f/u w/cardiology 11/12/18   Functional Questionnaire:   Activities of Daily Living (ADLs):   He states he are independent in the following: ambulation, bathing and hygiene, feeding, continence, grooming, toileting and dressing States he doesn't require assistance    Any transportation issues/concerns?: NO   Any patient concerns? NO   Confirmed importance and date/time of follow-up visits scheduled YES, appt 11/12/18  Provider Appointment booked with Dr. Ronnald Ramp  Confirmed with patient if condition begins to worsen call PCP or go to the ER.  Patient was given the office number and encouraged to call back with question or concerns.  : YES

## 2018-11-04 ENCOUNTER — Encounter: Payer: Self-pay | Admitting: Internal Medicine

## 2018-11-04 DIAGNOSIS — I2510 Atherosclerotic heart disease of native coronary artery without angina pectoris: Secondary | ICD-10-CM | POA: Diagnosis not present

## 2018-11-04 DIAGNOSIS — Z48812 Encounter for surgical aftercare following surgery on the circulatory system: Secondary | ICD-10-CM | POA: Diagnosis not present

## 2018-11-04 DIAGNOSIS — A4181 Sepsis due to Enterococcus: Secondary | ICD-10-CM | POA: Diagnosis not present

## 2018-11-04 DIAGNOSIS — R1312 Dysphagia, oropharyngeal phase: Secondary | ICD-10-CM | POA: Diagnosis not present

## 2018-11-04 DIAGNOSIS — Z452 Encounter for adjustment and management of vascular access device: Secondary | ICD-10-CM | POA: Diagnosis not present

## 2018-11-04 DIAGNOSIS — T827XXA Infection and inflammatory reaction due to other cardiac and vascular devices, implants and grafts, initial encounter: Secondary | ICD-10-CM | POA: Diagnosis not present

## 2018-11-06 ENCOUNTER — Encounter: Payer: Medicare Other | Admitting: Internal Medicine

## 2018-11-07 ENCOUNTER — Telehealth: Payer: Self-pay | Admitting: Internal Medicine

## 2018-11-07 NOTE — Telephone Encounter (Signed)
Spoke with patient. Currently at his residence in [facility]. He reports his right chest incision remains covered with Steri-strips, no drainage, swelling, or redness noted on that side. Left chest incision sutures intact, moderate sanguineous drainage on dressing (most recently changed 2 days ago by [facility] RN). Pt removed dressing prior to calling and reports site does not actively appear to be draining. No fever/chills. Receiving IV ceftriaxone via PICC.   Discussed with Dr. Lovena Le, who recommended pt wash left chest incision with antibacterial soap and warm water 2x daily. Advised pt of this recommendation, encouraged him to keep site covered as site may continue to drain as it heals. Also advised to avoid submerging right chest due to Steri-strips. ED precautions given for fever/chills or worsening symptoms. Pt aware of wound check appointment on 11/12/18 and aware to call with any other questions.  Pt sent in photo of wound to be included in chart:

## 2018-11-07 NOTE — Telephone Encounter (Signed)
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  Please call

## 2018-11-11 ENCOUNTER — Encounter: Payer: Self-pay | Admitting: Internal Medicine

## 2018-11-11 DIAGNOSIS — T827XXA Infection and inflammatory reaction due to other cardiac and vascular devices, implants and grafts, initial encounter: Secondary | ICD-10-CM | POA: Diagnosis not present

## 2018-11-11 DIAGNOSIS — Z48812 Encounter for surgical aftercare following surgery on the circulatory system: Secondary | ICD-10-CM | POA: Diagnosis not present

## 2018-11-11 DIAGNOSIS — A4181 Sepsis due to Enterococcus: Secondary | ICD-10-CM | POA: Diagnosis not present

## 2018-11-11 DIAGNOSIS — R1312 Dysphagia, oropharyngeal phase: Secondary | ICD-10-CM | POA: Diagnosis not present

## 2018-11-11 DIAGNOSIS — Z452 Encounter for adjustment and management of vascular access device: Secondary | ICD-10-CM | POA: Diagnosis not present

## 2018-11-11 DIAGNOSIS — I2510 Atherosclerotic heart disease of native coronary artery without angina pectoris: Secondary | ICD-10-CM | POA: Diagnosis not present

## 2018-11-12 ENCOUNTER — Encounter (INDEPENDENT_AMBULATORY_CARE_PROVIDER_SITE_OTHER): Payer: Self-pay

## 2018-11-12 ENCOUNTER — Other Ambulatory Visit: Payer: Self-pay

## 2018-11-12 ENCOUNTER — Ambulatory Visit (INDEPENDENT_AMBULATORY_CARE_PROVIDER_SITE_OTHER): Payer: Medicare Other | Admitting: *Deleted

## 2018-11-12 ENCOUNTER — Encounter: Payer: Self-pay | Admitting: Internal Medicine

## 2018-11-12 ENCOUNTER — Ambulatory Visit (INDEPENDENT_AMBULATORY_CARE_PROVIDER_SITE_OTHER): Payer: Medicare Other | Admitting: Internal Medicine

## 2018-11-12 VITALS — BP 138/62 | HR 64 | Ht 72.0 in | Wt 203.0 lb

## 2018-11-12 DIAGNOSIS — N182 Chronic kidney disease, stage 2 (mild): Secondary | ICD-10-CM

## 2018-11-12 DIAGNOSIS — I10 Essential (primary) hypertension: Secondary | ICD-10-CM | POA: Diagnosis not present

## 2018-11-12 DIAGNOSIS — R7881 Bacteremia: Secondary | ICD-10-CM

## 2018-11-12 DIAGNOSIS — B955 Unspecified streptococcus as the cause of diseases classified elsewhere: Secondary | ICD-10-CM

## 2018-11-12 DIAGNOSIS — I495 Sick sinus syndrome: Secondary | ICD-10-CM

## 2018-11-12 LAB — CUP PACEART INCLINIC DEVICE CHECK
Battery Remaining Longevity: 163 mo
Battery Voltage: 3.22 V
Brady Statistic AP VP Percent: 0.3 %
Brady Statistic AP VS Percent: 56.29 %
Brady Statistic AS VP Percent: 0.07 %
Brady Statistic AS VS Percent: 43.34 %
Brady Statistic RA Percent Paced: 56.67 %
Brady Statistic RV Percent Paced: 0.37 %
Date Time Interrogation Session: 20200609104658
Implantable Lead Implant Date: 20200527
Implantable Lead Implant Date: 20200527
Implantable Lead Location: 753859
Implantable Lead Location: 753860
Implantable Lead Model: 5076
Implantable Lead Model: 5076
Implantable Pulse Generator Implant Date: 20200527
Lead Channel Impedance Value: 437 Ohm
Lead Channel Impedance Value: 589 Ohm
Lead Channel Impedance Value: 608 Ohm
Lead Channel Impedance Value: 912 Ohm
Lead Channel Pacing Threshold Amplitude: 0.75 V
Lead Channel Pacing Threshold Amplitude: 1 V
Lead Channel Pacing Threshold Pulse Width: 0.4 ms
Lead Channel Pacing Threshold Pulse Width: 0.4 ms
Lead Channel Sensing Intrinsic Amplitude: 4.875 mV
Lead Channel Sensing Intrinsic Amplitude: 9 mV
Lead Channel Setting Pacing Amplitude: 3.5 V
Lead Channel Setting Pacing Amplitude: 3.5 V
Lead Channel Setting Pacing Pulse Width: 0.4 ms
Lead Channel Setting Sensing Sensitivity: 2 mV

## 2018-11-12 NOTE — Progress Notes (Signed)
Subjective:  Patient ID: [name] Glass, male    DOB: [birth year]  Age: 78 y.o. MRN: [id number]  CC: Hypothyroidism   HPI [name] Glass presents for a hosp f/up -- He was recently admitted for bacteremia.  Blood cultures were positive for strep. The source of the infection was felt to be a pacemaker on the left side of the chest.  The pacemaker was removed and a new one implanted on the right side.  He had the stitches removed today.  The wound is healing nicely with no pain, redness, or swelling.  He has had a few nights sweats and generalized weakness but denies fever or chills.  He has a PICC line and continues to receive intravenous ceftriaxone daily.  Discharge Diagnoses:  Principal Problem:   Streptococcal Gallolyticus Bacteremia Active Problems:   Coronary artery disease   OA (osteoarthritis) of knee   Gastroesophageal reflux disease without esophagitis   Essential hypertension, benign   Hyperlipidemia with target LDL less than 70   Chronic renal disease, stage 2, mildly decreased glomerular filtration rate (GFR) between 60-89 mL/min/1.73 square meter   Sepsis ([place])   H/O splenectomy   Presence of permanent cardiac pacemaker   Sleep apnea   Acute low back pain   Chronic bronchitis ([facility])  Resolving sepsis secondary to strep Gallolyticus bacteremiastatus post pacing system removal on 10/25/2018 Suspected Early Lumbar Infection - Presented with fever with T-max of 102.5, tachycardia with heart rate 133, tachypnea with respiration rate 32  - Blood cultures drawn on 10/22/2018+ forstrep gallolyticus  - 5/20 and 5/23 repeat cultures so for NGTD  - MRI spine with mild paraspinous edema involving R>L posterior paraspinous musculature (consider strain vs myositis). No other evidence for acute infection within lumbar spine.  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 Will need to  follow-up outpatient with GI -> will need elective colonoscopy as outpatient; Self-reported last colonoscopy was 3 years ago with [facility] [place] - S/p TEE on 10/25/2018 no valvular vegetation or ICD vegetation. 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 - Post pacing system removal on 10/25/2018 - Continue ceftriaxone through 11/20/2018 per ID; follow up with them 11/20/2018 - PICC line for long-term IV antibiotics - Infectious disease following, now signed off - s/p pacer to be replacement  Outpatient Medications Prior to Visit  Medication Sig Dispense Refill  . acidophilus (RISAQUAD) CAPS capsule Take 1 capsule by mouth 2 (two) times daily.    [name] anastrozole (ARIMIDEX) 1 MG tablet Take 1 mg by mouth 2 (two) times a week.     [name] aspirin EC 81 MG tablet Take 1 tablet (81 mg total) by mouth daily. 90 tablet 3  . cefTRIAXone (ROCEPHIN) IVPB Inject 2 g into the vein daily for 22 days. Indication:  Pacemaker pocket infection w/Strep bacteremia Last Day of Therapy:  11/20/18 Labs - Once weekly:  CBC/D and BMP, Labs - Every other week:  ESR and CRP 22 Units 0  . clopidogrel (PLAVIX) 75 MG tablet Take 1 tablet (75 mg total) by mouth daily. 90 tablet 0  . DEXILANT 60 MG capsule Take 1 capsule by mouth daily.  1  . ezetimibe (ZETIA) 10 MG tablet TAKE 1 TABLET BY MOUTH EVERY DAY (Patient taking differently: Take 10 mg by mouth daily. ) 90 tablet 1  . fluticasone (FLONASE) 50 MCG/ACT nasal spray USE DAILY AS DIRECTED (Patient taking differently: Place 2 sprays into both nostrils daily. )  48 g 1  . hydrALAZINE (APRESOLINE) 25 MG tablet Take 1 tablet (25 mg total) by mouth 3 (three) times daily for 30 days. 90 tablet 0  . liothyronine (CYTOMEL) 25 MCG tablet Take 25 mcg by mouth every morning.   3  . Nutritional Supplements (DHEA PO) Take 25 mg by mouth.    . Pitavastatin Calcium (LIVALO) 4 MG TABS Take 1 tablet (4 mg total) by mouth daily. 90 tablet 1  . tamsulosin (FLOMAX) 0.4 MG CAPS  capsule Take 0.4 mg by mouth daily after breakfast.   5  . thyroid (ARMOUR) 60 MG tablet Take 60-120 mg by mouth 2 (two) times daily.      No facility-administered medications prior to visit.     ROS Review of Systems  Constitutional: Negative for appetite change, chills, diaphoresis, fatigue and fever.  HENT: Negative.  Negative for sore throat.   Eyes: Negative for visual disturbance.  Respiratory: Negative for cough, chest tightness and shortness of breath.   Cardiovascular: Negative for chest pain, palpitations and leg swelling.  Gastrointestinal: Negative for abdominal pain, diarrhea, nausea and vomiting.  Endocrine: Negative for cold intolerance and heat intolerance.  Genitourinary: Negative.  Negative for difficulty urinating.  Musculoskeletal: Negative.   Skin: Negative.  Negative for color change.  Neurological: Positive for weakness. Negative for dizziness and light-headedness.  Hematological: Negative for adenopathy. Does not bruise/bleed easily.  Psychiatric/Behavioral: Negative.     Objective:  BP 138/62 (BP Location: Left Arm, Patient Position: Sitting, Cuff Size: Large)   Pulse 64   Ht 6' (1.829 m)   Wt 203 lb (92.1 kg)   SpO2 95%   BMI 27.53 kg/m   BP Readings from Last 3 Encounters:  11/12/18 138/62  10/31/18 132/70  08/13/18 140/80    Wt Readings from Last 3 Encounters:  11/12/18 203 lb (92.1 kg)  10/31/18 202 lb 3.2 oz (91.7 kg)  08/13/18 213 lb 8 oz (96.8 kg)    Physical Exam Cardiovascular:     Rate and Rhythm: Normal rate and regular rhythm.     Chest Wall: PMI is not displaced.     Heart sounds: S1 normal and S2 normal. Murmur present. Systolic murmur present with a grade of 1/6. No diastolic murmur. No S3 or S4 sounds.      Lab Results  Component Value Date   WBC 8.4 10/31/2018   HGB 15.4 10/31/2018   HCT 46.3 10/31/2018   PLT 317 10/31/2018   GLUCOSE 92 10/31/2018   CHOL 156 09/04/2018   TRIG 179 (H) 09/04/2018   HDL 38 (L)  09/04/2018   LDLCALC 82 09/04/2018   ALT 33 10/30/2018   AST 19 10/30/2018   NA 139 10/31/2018   K 3.9 10/31/2018   CL 100 10/31/2018   CREATININE 1.05 10/31/2018   BUN 15 10/31/2018   CO2 32 10/31/2018   TSH 1.45 05/24/2018   PSA 4.4 05/24/2018   INR 0.96 07/28/2014   HGBA1C 6.1 07/21/2017    Ct Angio Chest Pe W And/or Wo Contrast  Result Date: 10/22/2018 CLINICAL DATA:  Shortness of breath, chest tightness EXAM: CT ANGIOGRAPHY CHEST WITH CONTRAST TECHNIQUE: Multidetector CT imaging of the chest was performed using the standard protocol during bolus administration of intravenous contrast. Multiplanar CT image reconstructions and MIPs were obtained to evaluate the vascular anatomy. CONTRAST:  124m OMNIPAQUE IOHEXOL 350 MG/ML SOLN COMPARISON:  None. FINDINGS: Cardiovascular: Satisfactory opacification of the pulmonary arteries to the segmental level. No evidence of pulmonary embolism. Cardiomegaly.  Extensive 3 vessel coronary artery calcifications and/or stents. Left chest multi lead pacer. No pericardial effusion. Mediastinum/Nodes: No enlarged mediastinal, hilar, or axillary lymph nodes. Thyroid gland, trachea, and esophagus demonstrate no significant findings. Lungs/Pleura: Bibasilar scarring or atelectasis. No pleural effusion or pneumothorax. Upper Abdomen: No acute abnormality. Postoperative findings of splenectomy. Musculoskeletal: No chest wall abnormality. No acute or significant osseous findings. Review of the MIP images confirms the above findings. IMPRESSION: 1.  Negative examination for pulmonary embolism. 2.  Bibasilar scarring or atelectasis. 3.  Cardiomegaly and coronary artery disease. Electronically Signed   By: Eddie Candle M.D.   On: 10/22/2018 16:14   Mr Thoracic Spine Wo Contrast  Result Date: 10/23/2018 CLINICAL DATA:  Initial evaluation for acute sepsis. EXAM: MRI THORACIC AND LUMBAR SPINE WITHOUT CONTRAST TECHNIQUE: Multiplanar and multiecho pulse sequences of the  thoracic and lumbar spine were obtained without intravenous contrast. COMPARISON:  None available. FINDINGS: MRI THORACIC SPINE FINDINGS Alignment: Market dextroscoliosis of the midthoracic spine. Grade 1 anterolisthesis of T2 on T3, likely chronic and degenerative. Alignment otherwise normal with preservation of the normal thoracic kyphosis. Vertebrae: Vertebral body heights maintained without evidence for acute or chronic fracture. Few scattered chronic endplate Schmorl's nodes noted within the lower thoracic spine. Bone marrow signal intensity within normal limits. No discrete or worrisome osseous lesions. No evidence for osteomyelitis discitis or septic arthritis. Cord: Signal intensity within the thoracic spinal cord is normal. Normal cord caliber and morphology. No epidural collections. Paraspinal and other soft tissues: Paraspinous soft tissues demonstrate no acute finding. Small layering right pleural effusion with associated atelectasis. Visualized visceral structures grossly within normal limits. Disc levels: T1-2: Bilateral facet hypertrophy, greater on the right. No significant stenosis. T2-3: Grade 1 anterolisthesis. Bilateral facet hypertrophy. No spinal stenosis. Mild right foraminal narrowing. T3-4:  Negative interspace.  Mild facet hypertrophy.  No stenosis. T4-5: Negative interspace. Right greater than left facet hypertrophy. No significant stenosis. T5-6:  Negative interspace.  Mild facet hypertrophy.  No stenosis. T6-7: Negative interspace. Mild facet and ligament flavum hypertrophy. No significant stenosis. T7-8:  Negative interspace.  Mild facet hypertrophy.  No stenosis. T8-9:  Mild facet hypertrophy.  No stenosis. T9-10: Bilateral facet hypertrophy with ankylosis. No significant stenosis. T10-11:  Posterior element hypertrophy.  No stenosis. T11-12:  Bilateral facet hypertrophy.  No stenosis. T12-L1:  Negative. MRI LUMBAR SPINE FINDINGS Segmentation: Transitional lumbosacral anatomy with  partial sacralization of the L5 vertebral body. L5-S1 disc somewhat rudimentary. Alignment: Trace 2 mm anterolisthesis of L3 on L4. Mild levoscoliosis. Alignment otherwise normal with preservation of the normal lumbar lordosis. Vertebrae: Vertebral body heights maintained without evidence for acute or chronic fracture. Bone marrow signal intensity mildly heterogeneous but within normal limits. No worrisome osseous lesions. Minimal reactive marrow edema about the bilateral facets at L2-3 through L4-5, likely due to facet arthritis, greater on the right. No evidence for osteomyelitis or discitis. Partially visualized SI joints within normal limits. Conus medullaris and cauda equina: Conus extends to the L1 level. Conus and cauda equina appear normal. Paraspinal and other soft tissues: Mild edema seen involving the right greater than left lower posterior paraspinous musculature (series 27, image 5), likely reactive in nature due to adjacent facet degeneration. Possible muscular strain and/or injury or acute myositis could also be considered. No loculated collections. Paraspinous soft tissues demonstrate no other acute finding. Approximate 1 cm right renal cyst noted. Atherosclerotic change noted within the intra-abdominal aorta. Visualized visceral structures otherwise unremarkable. Disc levels: L1-2:  Unremarkable. L2-3: Mild disc  bulge with disc desiccation. Moderate right with mild left facet hypertrophy. Superimposed 14 mm synovial cyst noted at the lateral aspect of the right L2-3 facet (series 29, image 10). This exhibits no mass effect on the adjacent thecal sac. Thecal sac. Resultant mild canal with right lateral recess narrowing. Mild right L2 foraminal stenosis. L3-4: Trace anterolisthesis. Mild disc bulge with disc desiccation. Moderate bilateral facet hypertrophy. Resultant mild canal with bilateral lateral recess stenosis, slightly worse on the right. Mild bilateral foraminal narrowing. L4-5: Disc  desiccation with minimal annular bulge. Moderate facet hypertrophy. No significant stenosis. L5-S1: Transitional lumbosacral anatomy with rudimentary L5-S1 disc. Minimal endplate osteophytic spurring without significant disc bulge. Mild facet hypertrophy. No significant stenosis. IMPRESSION: MR THORACIC SPINE IMPRESSION 1. No evidence for acute infection or other abnormality within the thoracic spine. 2. Moderate thoracic dextroscoliosis with associated multilevel facet hypertrophy as above. No significant spinal stenosis. Mild right foraminal narrowing at T2-3. 3. Small layering right pleural effusion with associated atelectasis. MR LUMBAR SPINE IMPRESSION 1. Mild paraspinous edema involving the right greater than left lower posterior paraspinous musculature, favored to be reactive in nature due to adjacent facet degeneration. Finding could result in right-sided lower back pain. Sequelae of muscular injury and/or strain or possibly myositis could also be considered. No discrete soft tissue collections. 2. No other evidence for acute infection within the lumbar spine. No evidence for osteomyelitis or discitis. No epidural collections. 3. Mild for age multilevel disc bulging with moderate facet hypertrophy at L2-3 through L4-5 as above. Resultant mild canal with bilateral lateral recess narrowing at L2-3 and L3-4. Electronically Signed   By: Jeannine Boga M.D.   On: 10/23/2018 19:23   Mr Lumbar Spine Wo Contrast  Result Date: 10/23/2018 CLINICAL DATA:  Initial evaluation for acute sepsis. EXAM: MRI THORACIC AND LUMBAR SPINE WITHOUT CONTRAST TECHNIQUE: Multiplanar and multiecho pulse sequences of the thoracic and lumbar spine were obtained without intravenous contrast. COMPARISON:  None available. FINDINGS: MRI THORACIC SPINE FINDINGS Alignment: Market dextroscoliosis of the midthoracic spine. Grade 1 anterolisthesis of T2 on T3, likely chronic and degenerative. Alignment otherwise normal with preservation  of the normal thoracic kyphosis. Vertebrae: Vertebral body heights maintained without evidence for acute or chronic fracture. Few scattered chronic endplate Schmorl's nodes noted within the lower thoracic spine. Bone marrow signal intensity within normal limits. No discrete or worrisome osseous lesions. No evidence for osteomyelitis discitis or septic arthritis. Cord: Signal intensity within the thoracic spinal cord is normal. Normal cord caliber and morphology. No epidural collections. Paraspinal and other soft tissues: Paraspinous soft tissues demonstrate no acute finding. Small layering right pleural effusion with associated atelectasis. Visualized visceral structures grossly within normal limits. Disc levels: T1-2: Bilateral facet hypertrophy, greater on the right. No significant stenosis. T2-3: Grade 1 anterolisthesis. Bilateral facet hypertrophy. No spinal stenosis. Mild right foraminal narrowing. T3-4:  Negative interspace.  Mild facet hypertrophy.  No stenosis. T4-5: Negative interspace. Right greater than left facet hypertrophy. No significant stenosis. T5-6:  Negative interspace.  Mild facet hypertrophy.  No stenosis. T6-7: Negative interspace. Mild facet and ligament flavum hypertrophy. No significant stenosis. T7-8:  Negative interspace.  Mild facet hypertrophy.  No stenosis. T8-9:  Mild facet hypertrophy.  No stenosis. T9-10: Bilateral facet hypertrophy with ankylosis. No significant stenosis. T10-11:  Posterior element hypertrophy.  No stenosis. T11-12:  Bilateral facet hypertrophy.  No stenosis. T12-L1:  Negative. 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 L5-S1 disc somewhat rudimentary. Alignment:  Trace 2 mm anterolisthesis of L3 on L4. Mild levoscoliosis. Alignment otherwise normal with preservation of the normal lumbar lordosis. Vertebrae: Vertebral body heights maintained without evidence for acute or chronic  fracture. Bone marrow signal intensity mildly heterogeneous but within normal limits. No worrisome osseous lesions. Minimal reactive marrow edema about the bilateral facets at L2-3 through L4-5, likely due to facet arthritis, greater on the right. No evidence for osteomyelitis or discitis. Partially visualized SI joints within normal limits. Conus medullaris and cauda equina: Conus extends to the L1 level. Conus and cauda equina appear normal. Paraspinal and other soft tissues: Mild edema seen involving the right greater than left lower posterior paraspinous musculature (series 27, image 5), likely reactive in nature due to adjacent facet degeneration. Possible muscular strain and/or injury or acute myositis could also be considered. No loculated collections. Paraspinous soft tissues demonstrate no other acute finding. Approximate 1 cm right renal cyst noted. Atherosclerotic change noted within the intra-abdominal aorta. Visualized visceral structures otherwise unremarkable. Disc levels: L1-2:  Unremarkable. L2-3: Mild disc bulge with disc desiccation. Moderate right with mild left facet hypertrophy. Superimposed 14 mm synovial cyst noted at the lateral aspect of the right L2-3 facet (series 29, image 10). This exhibits no mass effect on the adjacent thecal sac. Thecal sac. Resultant mild canal with right lateral recess narrowing. Mild right L2 foraminal stenosis. L3-4: Trace anterolisthesis. Mild disc bulge with disc desiccation. Moderate bilateral facet hypertrophy. Resultant mild canal with bilateral lateral recess stenosis, slightly worse on the right. Mild bilateral foraminal narrowing. L4-5: Disc desiccation with minimal annular bulge. Moderate facet hypertrophy. No significant stenosis. L5-S1: Transitional lumbosacral anatomy with rudimentary L5-S1 disc. Minimal endplate osteophytic spurring without significant disc bulge. Mild facet hypertrophy. No significant stenosis. IMPRESSION: MR THORACIC SPINE  IMPRESSION 1. No evidence for acute infection or other abnormality within the thoracic spine. 2. Moderate thoracic dextroscoliosis with associated multilevel facet hypertrophy as above. No significant spinal stenosis. Mild right foraminal narrowing at T2-3. 3. Small layering right pleural effusion with associated atelectasis. MR LUMBAR SPINE IMPRESSION 1. Mild paraspinous edema involving the right greater than left lower posterior paraspinous musculature, favored to be reactive in nature due to adjacent facet degeneration. Finding could result in right-sided lower back pain. Sequelae of muscular injury and/or strain or possibly myositis could also be considered. No discrete soft tissue collections. 2. No other evidence for acute infection within the lumbar spine. No evidence for osteomyelitis or discitis. No epidural collections. 3. Mild for age multilevel disc bulging with moderate facet hypertrophy at L2-3 through L4-5 as above. Resultant mild canal with bilateral lateral recess narrowing at L2-3 and L3-4. Electronically Signed   By: Jeannine Boga M.D.   On: 10/23/2018 19:23   Dg Chest Portable 1 View  Result Date: 10/22/2018 CLINICAL DATA:  Dyspnea and shortness of breath EXAM: PORTABLE CHEST 1 VIEW COMPARISON:  08/13/2018 FINDINGS: Cardiac shadow is enlarged but accentuated by the portable technique. Pacing device is noted. No focal infiltrate or sizable effusion is seen. No acute bony abnormality is noted. IMPRESSION: No acute abnormality noted. Electronically Signed   By: Inez Catalina M.D.   On: 10/22/2018 11:48    Assessment & Plan:   [name] Glass was seen today for hypothyroidism.  Diagnoses and all orders for this visit:  Essential hypertension, benign-his blood pressure is adequately well controlled.  I will monitor his electrolytes, renal function, and thyroid function. -     CBC with Differential/Platelet; Future -     Basic metabolic  panel; Future -     TSH; Future  Chronic renal  disease, stage 2, mildly decreased glomerular filtration rate (GFR) between 60-89 mL/min/1.73 square meter-his renal function has improved.  He will avoid nephrotoxic agents. -     CBC with Differential/Platelet; Future -     Basic metabolic panel; Future  Streptococcal Gallolyticus Bacteremia- It appears the infection is being adequately treated with IV ceftriaxone.  He will complete the recommended course.   I am having [name] maintain his thyroid, acidophilus, Dexilant, Nutritional Supplements (DHEA PO), liothyronine, anastrozole, aspirin EC, tamsulosin, fluticasone, ezetimibe, Pitavastatin Calcium, cefTRIAXone, hydrALAZINE, and clopidogrel.  No orders of the defined types were placed in this encounter.    Follow-up: No follow-ups on file.  Scarlette Calico, MD

## 2018-11-12 NOTE — Progress Notes (Signed)
Wound check appointment. Steri-strips removed from right chest incision. Wound without redness, small hematoma noted. Incision edges approximated, wound healing well. Normal device function. Thresholds, sensing, and impedances consistent with implant measurements. Device programmed at 3.5V for extra safety margin until 3 month visit. Histogram distribution appropriate for patient and level of activity. No mode switches or high ventricular rates noted. Patient educated about wound care, arm mobility, lifting restrictions. ROV 11/26/18 to reassess hematoma.  Left side PPM extracted 10/25/18. Pt reports small amounts of sanguineous drainage. Per WC, sutures removed. Educated pt on wound care. ROV 11/26/18 to reassess.

## 2018-11-12 NOTE — Patient Instructions (Signed)

## 2018-11-18 DIAGNOSIS — R1312 Dysphagia, oropharyngeal phase: Secondary | ICD-10-CM | POA: Diagnosis not present

## 2018-11-18 DIAGNOSIS — Z7982 Long term (current) use of aspirin: Secondary | ICD-10-CM

## 2018-11-18 DIAGNOSIS — Z452 Encounter for adjustment and management of vascular access device: Secondary | ICD-10-CM | POA: Diagnosis not present

## 2018-11-18 DIAGNOSIS — Z7902 Long term (current) use of antithrombotics/antiplatelets: Secondary | ICD-10-CM

## 2018-11-18 DIAGNOSIS — Z7951 Long term (current) use of inhaled steroids: Secondary | ICD-10-CM

## 2018-11-18 DIAGNOSIS — E785 Hyperlipidemia, unspecified: Secondary | ICD-10-CM

## 2018-11-18 DIAGNOSIS — N182 Chronic kidney disease, stage 2 (mild): Secondary | ICD-10-CM

## 2018-11-18 DIAGNOSIS — N179 Acute kidney failure, unspecified: Secondary | ICD-10-CM

## 2018-11-18 DIAGNOSIS — M545 Low back pain: Secondary | ICD-10-CM

## 2018-11-18 DIAGNOSIS — R778 Other specified abnormalities of plasma proteins: Secondary | ICD-10-CM

## 2018-11-18 DIAGNOSIS — I130 Hypertensive heart and chronic kidney disease with heart failure and stage 1 through stage 4 chronic kidney disease, or unspecified chronic kidney disease: Secondary | ICD-10-CM | POA: Diagnosis not present

## 2018-11-18 DIAGNOSIS — G4730 Sleep apnea, unspecified: Secondary | ICD-10-CM

## 2018-11-18 DIAGNOSIS — E039 Hypothyroidism, unspecified: Secondary | ICD-10-CM

## 2018-11-18 DIAGNOSIS — Z79811 Long term (current) use of aromatase inhibitors: Secondary | ICD-10-CM

## 2018-11-18 DIAGNOSIS — I700 Atherosclerosis of aorta: Secondary | ICD-10-CM

## 2018-11-18 DIAGNOSIS — J301 Allergic rhinitis due to pollen: Secondary | ICD-10-CM

## 2018-11-18 DIAGNOSIS — J449 Chronic obstructive pulmonary disease, unspecified: Secondary | ICD-10-CM

## 2018-11-18 DIAGNOSIS — J9601 Acute respiratory failure with hypoxia: Secondary | ICD-10-CM

## 2018-11-18 DIAGNOSIS — Z950 Presence of cardiac pacemaker: Secondary | ICD-10-CM

## 2018-11-18 DIAGNOSIS — T827XXA Infection and inflammatory reaction due to other cardiac and vascular devices, implants and grafts, initial encounter: Secondary | ICD-10-CM | POA: Diagnosis not present

## 2018-11-18 DIAGNOSIS — Z5181 Encounter for therapeutic drug level monitoring: Secondary | ICD-10-CM

## 2018-11-18 DIAGNOSIS — Q211 Atrial septal defect: Secondary | ICD-10-CM

## 2018-11-18 DIAGNOSIS — M1710 Unilateral primary osteoarthritis, unspecified knee: Secondary | ICD-10-CM

## 2018-11-18 DIAGNOSIS — I495 Sick sinus syndrome: Secondary | ICD-10-CM

## 2018-11-18 DIAGNOSIS — I2584 Coronary atherosclerosis due to calcified coronary lesion: Secondary | ICD-10-CM | POA: Diagnosis not present

## 2018-11-18 DIAGNOSIS — I509 Heart failure, unspecified: Secondary | ICD-10-CM | POA: Diagnosis not present

## 2018-11-18 DIAGNOSIS — Z792 Long term (current) use of antibiotics: Secondary | ICD-10-CM

## 2018-11-18 DIAGNOSIS — I252 Old myocardial infarction: Secondary | ICD-10-CM

## 2018-11-18 DIAGNOSIS — K219 Gastro-esophageal reflux disease without esophagitis: Secondary | ICD-10-CM

## 2018-11-18 DIAGNOSIS — K449 Diaphragmatic hernia without obstruction or gangrene: Secondary | ICD-10-CM

## 2018-11-18 DIAGNOSIS — I2510 Atherosclerotic heart disease of native coronary artery without angina pectoris: Secondary | ICD-10-CM

## 2018-11-18 DIAGNOSIS — Z48812 Encounter for surgical aftercare following surgery on the circulatory system: Secondary | ICD-10-CM | POA: Diagnosis not present

## 2018-11-18 DIAGNOSIS — R9431 Abnormal electrocardiogram [ECG] [EKG]: Secondary | ICD-10-CM

## 2018-11-18 DIAGNOSIS — A4181 Sepsis due to Enterococcus: Secondary | ICD-10-CM | POA: Diagnosis not present

## 2018-11-19 ENCOUNTER — Telehealth: Payer: Self-pay | Admitting: Internal Medicine

## 2018-11-19 NOTE — Telephone Encounter (Signed)
COVID-19 Pre-Screening Questions: ° °Do you currently have a fever (>100 °F), chills or unexplained body aches? No   ° °Are you currently experiencing new cough, shortness of breath, sore throat, runny nose? No   °•  °Have you recently travelled outside the state of [place] in the last 14 days? no °•  °1. Have you been in contact with someone that is currently pending confirmation of Covid19 testing or has been confirmed to have the Covid19 virus?  No  ° °

## 2018-11-20 ENCOUNTER — Other Ambulatory Visit: Payer: Self-pay

## 2018-11-20 ENCOUNTER — Encounter: Payer: Self-pay | Admitting: Internal Medicine

## 2018-11-20 ENCOUNTER — Telehealth: Payer: Self-pay

## 2018-11-20 ENCOUNTER — Ambulatory Visit (INDEPENDENT_AMBULATORY_CARE_PROVIDER_SITE_OTHER): Payer: Medicare Other | Admitting: Internal Medicine

## 2018-11-20 DIAGNOSIS — R7881 Bacteremia: Secondary | ICD-10-CM | POA: Diagnosis not present

## 2018-11-20 DIAGNOSIS — I2510 Atherosclerotic heart disease of native coronary artery without angina pectoris: Secondary | ICD-10-CM | POA: Diagnosis not present

## 2018-11-20 DIAGNOSIS — Z452 Encounter for adjustment and management of vascular access device: Secondary | ICD-10-CM | POA: Diagnosis not present

## 2018-11-20 DIAGNOSIS — T827XXA Infection and inflammatory reaction due to other cardiac and vascular devices, implants and grafts, initial encounter: Secondary | ICD-10-CM | POA: Diagnosis not present

## 2018-11-20 DIAGNOSIS — A4181 Sepsis due to Enterococcus: Secondary | ICD-10-CM | POA: Diagnosis not present

## 2018-11-20 DIAGNOSIS — R1312 Dysphagia, oropharyngeal phase: Secondary | ICD-10-CM | POA: Diagnosis not present

## 2018-11-20 DIAGNOSIS — B955 Unspecified streptococcus as the cause of diseases classified elsewhere: Secondary | ICD-10-CM | POA: Diagnosis not present

## 2018-11-20 DIAGNOSIS — Z48812 Encounter for surgical aftercare following surgery on the circulatory system: Secondary | ICD-10-CM | POA: Diagnosis not present

## 2018-11-20 MED ORDER — CEPHALEXIN 500 MG PO CAPS: 500.0000 mg | ORAL_CAPSULE | Freq: Three times a day (TID) | ORAL | 0 refills | Status: DC

## 2018-11-20 NOTE — Progress Notes (Signed)
[name]  Patient Active Problem List   Diagnosis Date Noted  . Streptococcal Gallolyticus Bacteremia 10/23/2018    Priority: High  . Presence of permanent cardiac pacemaker 10/23/2018    Priority: High  . 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 Sleep apnea 10/23/2018  . 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  . 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  . Routine general medical examination at a health care facility 06/29/2017  . PSA elevation 06/26/2017  . Benign prostatic hyperplasia without lower urinary tract symptoms 05/21/2017  . Seasonal allergic rhinitis due to pollen 05/18/2017  . Chronic renal disease, stage 2, mildly decreased glomerular filtration rate (GFR) between 60-89 mL/min/1.73 square meter 06/14/2016  . Systolic murmur 17/91/5056  . Gastroesophageal reflux disease without esophagitis 12/19/2015  . Essential hypertension, benign 12/19/2015  . Hyperlipidemia with target LDL less than 70 12/19/2015  . OA (osteoarthritis) of knee 08/03/2014  . Coronary artery disease 07/10/2014    Patient's Medications  New Prescriptions   CEPHALEXIN (KEFLEX) 500 MG CAPSULE    Take 1 capsule (500 mg total) by mouth 3 (three) times daily.  Previous Medications   ACIDOPHILUS (RISAQUAD) CAPS CAPSULE    Take 1 capsule by mouth 2 (two) times daily.   ANASTROZOLE (ARIMIDEX) 1 MG TABLET    Take 1 mg by mouth 2 (two) times a week.    ASPIRIN EC 81 MG TABLET    Take 1 tablet (81 mg total) by mouth daily.   CLOPIDOGREL (PLAVIX) 75 MG TABLET    Take 1 tablet (75 mg total) by mouth daily.   DEXILANT 60 MG CAPSULE    Take 1 capsule by mouth daily.   EZETIMIBE (ZETIA) 10 MG TABLET    TAKE 1 TABLET BY MOUTH EVERY DAY   FLUTICASONE (FLONASE) 50 MCG/ACT NASAL SPRAY    USE DAILY AS DIRECTED   HYDRALAZINE (APRESOLINE) 25 MG TABLET    Take 1 tablet (25 mg total) by mouth 3 (three) times daily for 30 days.   LIOTHYRONINE (CYTOMEL) 25 MCG TABLET    Take 25  mcg by mouth every morning.    NUTRITIONAL SUPPLEMENTS (DHEA PO)    Take 25 mg by mouth.   PITAVASTATIN CALCIUM (LIVALO) 4 MG TABS    Take 1 tablet (4 mg total) by mouth daily.   TAMSULOSIN (FLOMAX) 0.4 MG CAPS CAPSULE    Take 0.4 mg by mouth daily after breakfast.    THYROID (ARMOUR) 60 MG TABLET    Take 60-120 mg by mouth 2 (two) times daily.   Modified Medications   No medications on file  Discontinued Medications   CEFTRIAXONE (ROCEPHIN) IVPB    Inject 2 g into the vein daily for 22 days. Indication:  Pacemaker pocket infection w/Strep bacteremia Last Day of Therapy:  11/20/18 Labs - Once weekly:  CBC/D and BMP, Labs - Every other week:  ESR and CRP    Subjective: [name] Glass is in for his hospital follow-up visit.  He was hospitalized last month with strep gallolyticus bacteremia complicating a pacemaker pocket infection.  When he was admitted he had severe back pain and although his MRI had was unremarkable I was concerned about the possibility of acute lumbar discitis as well.  He responded promptly to antibiotic therapy.  His old pacemaker was removed and he had a new pacemaker placed.  He has now had 4 weeks of IV antibiotic therapy.  He has had no problems tolerating  his pick more ceftriaxone.  He is feeling much better.  He has not had any more fever and his back pain is nearly completely gone.  Review of Systems: Review of Systems  Constitutional: Negative for chills, diaphoresis and fever.  HENT: Positive for congestion. Negative for sore throat.   Respiratory: Negative for cough.   Cardiovascular: Negative for chest pain.  Gastrointestinal: Negative for abdominal pain, diarrhea, nausea and vomiting.  Musculoskeletal: Positive for back pain.    Past Medical History:  Diagnosis Date  . Arthritis   . Coronary artery disease   . GERD (gastroesophageal reflux disease)   . H/O bronchitis   . H/O hiatal hernia   . History of kidney stones last in 1976  . Hyperlipidemia     statin intolerant, under control  . Hypertension    under control  . Hypothyroidism   . Sleep apnea    hx of, surgery to reverse    Social History   Tobacco Use  . Smoking status: Never Smoker  . Smokeless tobacco: Never Used  Substance Use Topics  . Alcohol use: Yes    Alcohol/week: 4.0 standard drinks    Types: 4 Standard drinks or equivalent per week    Comment: 3 drinks at night   . Drug use: No    Family History  Problem Relation Age of Onset  . Heart attack Father 32       lived to 86  . Alzheimer's disease Mother   . Stroke Mother   . Breast cancer Mother   . Alzheimer's disease Maternal Grandmother   . Stroke Maternal Grandmother   . Lung cancer Maternal Grandmother   . Lung cancer Maternal Grandfather   . Cancer Paternal Grandmother        type unknown  . Diabetes Paternal Grandfather   . Other Paternal Grandfather        Growth in the back of the head    Allergies  Allergen Reactions  . Morphine And Related     Had "crazy dreams" felt "crazy" per pt.  . Bactrim [Sulfamethoxazole-Trimethoprim]   . Statins     Objective: Vitals:   11/20/18 0946  BP: (!) 159/82  Pulse: 63  Temp: 98.3 F (36.8 C)  Weight: 202 lb (91.6 kg)   Body mass index is 27.4 kg/m.  Physical Exam Constitutional:      Comments: He is in good spirits.  Cardiovascular:     Rate and Rhythm: Normal rate and regular rhythm.     Heart sounds: No murmur.  Pulmonary:     Effort: Pulmonary effort is normal.     Breath sounds: Normal breath sounds.  Chest:    Psychiatric:        Mood and Affect: Mood normal.     Lab Results    Problem List Items Addressed This Visit      High   Streptococcal Gallolyticus Bacteremia    He has responded very well to 4 weeks of IV antibiotic therapy and pacemaker removal.  Because of some concern about the possibility of lumbar infection I will extend his therapy 2 more weeks with oral cephalexin.  He will follow-up here in 4 weeks.       Relevant Medications   cephALEXin (KEFLEX) 500 MG capsule       Michel Bickers, MD [facility] [phone] pager   [phone] cell 11/20/2018, 10:12 AM

## 2018-11-20 NOTE — Assessment & Plan Note (Signed)
He has responded very well to 4 weeks of IV antibiotic therapy and pacemaker removal.  Because of some concern about the possibility of lumbar infection I will extend his therapy 2 more weeks with oral cephalexin.  He will follow-up here in 4 weeks.

## 2018-11-20 NOTE — Telephone Encounter (Signed)
Called [place], [place], with orders per Dr. Megan Salon to have Picc line removed today if possible, last dose administered this morning.    [place] confirmed understanding with feedback, message transmitted to [facility] office working with patient. [facility] office will call if they have any further questions or concerns.  Patient is aware of new plans to have PICC line removed.

## 2018-11-21 ENCOUNTER — Other Ambulatory Visit: Payer: Self-pay | Admitting: Internal Medicine

## 2018-11-22 ENCOUNTER — Telehealth: Payer: Self-pay

## 2018-11-22 NOTE — Telephone Encounter (Signed)
Appointment 11-26-2018 at 2 pm.  LMOVM with my direct office number      [id number] Pre-Screening Questions:  . In the past 7 to 10 days have you had a cough,  shortness of breath, headache, congestion, fever (100 or greater) body aches, chills, sore throat, or sudden loss of taste or sense of smell? . Have you been around anyone with known Covid 19. . Have you been around anyone who is awaiting Covid 19 test results in the past 7 to 10 days? . Have you been around anyone who has been exposed to Covid 19, or has mentioned symptoms of Covid 19 within the past 7 to 10 days?  If you have any concerns/questions about symptoms patients report during screening (either on the phone or at [facility]). Contact the provider seeing the patient or DOD for further guidance.  If neither are available contact a member of the leadership team.

## 2018-11-24 ENCOUNTER — Other Ambulatory Visit: Payer: Self-pay | Admitting: Internal Medicine

## 2018-11-25 NOTE — Telephone Encounter (Signed)
Pt answered No to all Covid-19 pre-screening questions. I advised pt to wear a mask if he has one to his appointment and I also let him know we are reducing the number on people coming into the office. I asked him to come to his appointment alone if he can. The pt verbalized understanding.

## 2018-11-26 ENCOUNTER — Ambulatory Visit (INDEPENDENT_AMBULATORY_CARE_PROVIDER_SITE_OTHER): Payer: Medicare Other | Admitting: *Deleted

## 2018-11-26 ENCOUNTER — Ambulatory Visit (INDEPENDENT_AMBULATORY_CARE_PROVIDER_SITE_OTHER): Payer: Medicare Other | Admitting: Internal Medicine

## 2018-11-26 ENCOUNTER — Other Ambulatory Visit: Payer: Self-pay

## 2018-11-26 ENCOUNTER — Encounter: Payer: Self-pay | Admitting: Internal Medicine

## 2018-11-26 ENCOUNTER — Telehealth: Payer: Self-pay

## 2018-11-26 ENCOUNTER — Other Ambulatory Visit (INDEPENDENT_AMBULATORY_CARE_PROVIDER_SITE_OTHER): Payer: Medicare Other

## 2018-11-26 VITALS — BP 134/64 | HR 60 | Temp 97.7°F | Ht 72.0 in | Wt 202.0 lb

## 2018-11-26 DIAGNOSIS — N451 Epididymitis: Secondary | ICD-10-CM | POA: Insufficient documentation

## 2018-11-26 DIAGNOSIS — I2510 Atherosclerotic heart disease of native coronary artery without angina pectoris: Secondary | ICD-10-CM | POA: Diagnosis not present

## 2018-11-26 DIAGNOSIS — N5089 Other specified disorders of the male genital organs: Secondary | ICD-10-CM | POA: Diagnosis not present

## 2018-11-26 DIAGNOSIS — I495 Sick sinus syndrome: Secondary | ICD-10-CM

## 2018-11-26 LAB — URINALYSIS, ROUTINE W REFLEX MICROSCOPIC
Bilirubin Urine: NEGATIVE
Hgb urine dipstick: NEGATIVE
Leukocytes,Ua: NEGATIVE
Nitrite: NEGATIVE
Specific Gravity, Urine: 1.02 (ref 1.000–1.030)
Total Protein, Urine: 30 — AB
Urine Glucose: NEGATIVE
Urobilinogen, UA: 1 (ref 0.0–1.0)
WBC, UA: NONE SEEN (ref 0–?)
pH: 6 (ref 5.0–8.0)

## 2018-11-26 MED ORDER — LEVOFLOXACIN 500 MG PO TABS: 500.0000 mg | ORAL_TABLET | Freq: Every day | ORAL | 0 refills | Status: AC

## 2018-11-26 MED ORDER — OXYCODONE-ACETAMINOPHEN 7.5-325 MG PO TABS: 1.0000 | ORAL_TABLET | Freq: Four times a day (QID) | ORAL | 0 refills | Status: DC | PRN

## 2018-11-26 MED ORDER — PROMETHAZINE HCL 12.5 MG PO TABS: 12.5000 mg | ORAL_TABLET | Freq: Four times a day (QID) | ORAL | 0 refills | Status: DC | PRN

## 2018-11-26 NOTE — Telephone Encounter (Signed)
Copied from [place] [phone] [phone]. Topic: General - Call Back - No Documentation >> Nov 26, 2018  9:37 AM [name] wrote: Reason for CRM: Pt's wife [name] Glass called to report that pt is in a lot pain. Left testicle is severely swollen, 2 days now. Best Contact: [phone]

## 2018-11-26 NOTE — Patient Instructions (Signed)
Epididymitis    Epididymitis is swelling (inflammation) of the epididymis. The epididymis is a cord-like structure that is located along the top and back part of the testicle. It collects and stores sperm from the testicle.  This condition can also cause pain and swelling of the testicle and scrotum. Symptoms usually start suddenly (acute epididymitis). Sometimes epididymitis starts gradually and lasts for a while (chronic epididymitis). This type may be harder to treat.  What are the causes?  In men 35 and younger, this condition is usually caused by a bacterial infection or sexually transmitted disease (STD), such as:   Gonorrhea.   Chlamydia.  In men 35 and older who do not have anal sex, this condition is usually caused by bacteria from a blockage or abnormalities in the urinary system. These can result from:   Having a tube placed into the bladder (urinary catheter).   Having an enlarged or inflamed prostate gland.   Having recent urinary tract surgery.  In men who have a condition that weakens the body's defense system (immune system), such as HIV, this condition can be caused by:   Other bacteria, including tuberculosis and syphilis.   Viruses.   Fungi.  Sometimes this condition occurs without infection. That may happen if urine flows backward into the epididymis after heavy lifting or straining.  What increases the risk?  This condition is more likely to develop in men:   Who have unprotected sex with more than one partner.   Who have anal sex.   Who have recently had surgery.   Who have a urinary catheter.   Who have urinary problems.   Who have a suppressed immune system.  What are the signs or symptoms?  This condition usually begins suddenly with chills, fever, and pain behind the scrotum and in the testicle. Other symptoms include:   Swelling of the scrotum, testicle, or both.   Pain whenejaculatingor urinating.   Pain in the back or belly.   Nausea.   Itching and discharge from the  penis.   Frequent need to pass urine.   Redness and tenderness of the scrotum.  How is this diagnosed?  Your health care provider can diagnose this condition based on your symptoms and medical history. Your health care provider will also do a physical exam to ask about your symptoms and check your scrotum and testicle for swelling, pain, and redness. You may also have other tests, including:   Examination of discharge from the penis.   Urine tests for infections, such as STDs.  Your health care provider may test you for other STDs, including HIV.  How is this treated?  Treatment for this condition depends on the cause. If your condition is caused by a bacterial infection, oral antibiotic medicine may be prescribed. If the bacterial infection has spread to your blood, you may need to receive IV antibiotics. Nonbacterial epididymitis is treated with home care that includes bed rest and elevation of the scrotum.  Surgery may be needed to treat:   Bacterial epididymitis that causes pus to build up in the scrotum (abscess).   Chronic epididymitis that has not responded to other treatments.  Follow these instructions at home:  Medicines   Take over-the-counter and prescription medicines only as told by your health care provider.   If you were prescribed an antibiotic medicine, take it as told by your health care provider. Do not stop taking the antibiotic even if your condition improves.  Sexual Activity   If your   epididymitis was caused by an STD, avoid sexual activity until your treatment is complete.   Inform your sexual partner or partners if you test positive for an STD. They may need to be treated.Do not engage in sexual activity with your partner or partners until their treatment is completed.  General instructions   Return to your normal activities as told by your health care provider. Ask your health care provider what activities are safe for you.   Keep your scrotum elevated and supported while  resting. Ask your health care provider if you should wear a scrotal support, such as a jockstrap. Wear it as told by your health care provider.   If directed, apply ice to the affected area:  ? Put ice in a plastic bag.  ? Place a towel between your skin and the bag.  ? Leave the ice on for 20 minutes, 2-3 times per day.   Try taking a sitz bath to help with discomfort. This is a warm water bath that is taken while you are sitting down. The water should only come up to your hips and should cover your buttocks. Do this 3-4 times per day or as told by your health care provider.   Keep all follow-up visits as told by your health care provider. This is important.  Contact a health care provider if:   You have a fever.   Your pain medicine is not helping.   Your pain is getting worse.   Your symptoms do not improve within three days.  This information is not intended to replace advice given to you by your health care provider. Make sure you discuss any questions you have with your health care provider.  Document Released: 05/19/2000 Document Revised: 10/28/2015 Document Reviewed: 10/07/2014  Elsevier Interactive Patient Education  2019 [employer]

## 2018-11-26 NOTE — Telephone Encounter (Signed)
Patient has been added

## 2018-11-26 NOTE — Progress Notes (Signed)
  Wound check appointment. Left wound without redness & edema. Incision edges approximated, wound healing well. Small scab noted mid incision. Right incision well healed, without redness. Small edematous area, smaller than 11/12/18 visit, noted over device. Firm to palpation. Pt on Aspirin & Plavix. Will route to Dr. Curt Bears for recommendations. Device not interrogated.

## 2018-11-26 NOTE — Telephone Encounter (Signed)
Can you add pt to 03:15pm today with Dr. Ronnald Ramp please?

## 2018-11-26 NOTE — Patient Instructions (Signed)
Continue taking Aspirin and Plavix. Analise Glotfelty will call you if Dr. Curt Bears wants you to do anything differently.

## 2018-11-26 NOTE — Progress Notes (Signed)
Subjective:  Patient ID: [name] Glass, male    DOB: [birth year]  Age: 78 y.o. MRN: [id number]  CC: Groin Swelling (Right testicle x 3 days)   HPI [name] presents for a 3 day history of right scrotal pain and swelling.  He describes the pain as severe.  He has not gotten adequate symptom relief with Tylenol.  Outpatient Medications Prior to Visit  Medication Sig Dispense Refill   acidophilus (RISAQUAD) CAPS capsule Take 1 capsule by mouth 2 (two) times daily.     acyclovir (ZOVIRAX) 200 MG capsule TAKE 1 CAPSULE (200 MG TOTAL) BY MOUTH 3 (THREE) TIMES DAILY. 270 capsule 0   anastrozole (ARIMIDEX) 1 MG tablet Take 1 mg by mouth 2 (two) times a week.      aspirin EC 81 MG tablet Take 1 tablet (81 mg total) by mouth daily. 90 tablet 3   cephALEXin (KEFLEX) 500 MG capsule Take 1 capsule (500 mg total) by mouth 3 (three) times daily. 42 capsule 0   clopidogrel (PLAVIX) 75 MG tablet Take 1 tablet (75 mg total) by mouth daily. 90 tablet 0   DEXILANT 60 MG capsule Take 1 capsule by mouth daily.  1   ezetimibe (ZETIA) 10 MG tablet Take 1 tablet (10 mg total) by mouth daily. 90 tablet 1   fluticasone (FLONASE) 50 MCG/ACT nasal spray USE DAILY AS DIRECTED (Patient taking differently: Place 2 sprays into both nostrils daily. ) 48 g 1   hydrALAZINE (APRESOLINE) 25 MG tablet Take 1 tablet (25 mg total) by mouth 3 (three) times daily for 30 days. 90 tablet 0   liothyronine (CYTOMEL) 25 MCG tablet Take 25 mcg by mouth every morning.   3   Nutritional Supplements (DHEA PO) Take 25 mg by mouth.     Pitavastatin Calcium (LIVALO) 4 MG TABS Take 1 tablet (4 mg total) by mouth daily. 90 tablet 1   tamsulosin (FLOMAX) 0.4 MG CAPS capsule Take 0.4 mg by mouth daily after breakfast.   5   thyroid (ARMOUR) 60 MG tablet Take 60-120 mg by mouth 2 (two) times daily.      No facility-administered medications prior to visit.     ROS Review of Systems  Constitutional: Negative.  Negative for  chills, fatigue and fever.  HENT: Negative.   Eyes: Negative.   Respiratory: Negative for cough, chest tightness, shortness of breath and wheezing.   Cardiovascular: Negative for chest pain, palpitations and leg swelling.  Gastrointestinal: Negative for abdominal pain, diarrhea, nausea and vomiting.  Endocrine: Negative.   Genitourinary: Positive for scrotal swelling and testicular pain. Negative for decreased urine volume, difficulty urinating, discharge, dysuria, flank pain, frequency, hematuria, penile pain, penile swelling and urgency.  Musculoskeletal: Positive for back pain. Negative for arthralgias and myalgias.  Skin: Negative.  Negative for rash.  Neurological: Negative.   Hematological: Does not bruise/bleed easily.  Psychiatric/Behavioral: Negative.     Objective:  BP 134/64 (BP Location: Left Arm, Patient Position: Sitting, Cuff Size: Large)    Pulse 60    Temp 97.7 F (36.5 C) (Oral)    Ht 6' (1.829 m)    Wt 202 lb (91.6 kg)    SpO2 95%    BMI 27.40 kg/m   BP Readings from Last 3 Encounters:  11/26/18 134/64  11/20/18 (!) 159/82  11/12/18 138/62    Wt Readings from Last 3 Encounters:  11/26/18 202 lb (91.6 kg)  11/20/18 202 lb (91.6 kg)  11/12/18 203 lb (92.1 kg)  Physical Exam Vitals signs reviewed. Exam conducted with a chaperone present.  Constitutional:      General: He is not in acute distress.    Appearance: He is not ill-appearing, toxic-appearing or diaphoretic.  HENT:     Nose: Nose normal.     Mouth/Throat:     Mouth: Mucous membranes are moist.  Eyes:     General: No scleral icterus. Neck:     Musculoskeletal: Normal range of motion and neck supple. No neck rigidity.  Cardiovascular:     Rate and Rhythm: Normal rate.     Heart sounds: Murmur present. No gallop.   Pulmonary:     Breath sounds: No stridor. No wheezing, rhonchi or rales.  Abdominal:     General: Abdomen is flat. Bowel sounds are normal.     Palpations: There is no  hepatomegaly or splenomegaly.     Tenderness: There is no abdominal tenderness.     Hernia: No hernia is present.  Genitourinary:    Pubic Area: No rash.      Penis: Normal. No discharge, swelling or lesions.      Scrotum/Testes:        Right: Mass, tenderness and swelling present.        Left: Mass, tenderness or swelling not present.     Epididymis:     Right: Inflamed and enlarged. Mass present.     Left: Normal. Not inflamed or enlarged. No mass.     Comments: The right hemiscrotum is mildly swollen.  The epididymis and testicle are hard, nodular, and tender. Lymphadenopathy:     Cervical: No cervical adenopathy.     Lab Results  Component Value Date   WBC 8.4 10/31/2018   HGB 15.4 10/31/2018   HCT 46.3 10/31/2018   PLT 317 10/31/2018   GLUCOSE 92 10/31/2018   CHOL 156 09/04/2018   TRIG 179 (H) 09/04/2018   HDL 38 (L) 09/04/2018   LDLCALC 82 09/04/2018   ALT 33 10/30/2018   AST 19 10/30/2018   NA 139 10/31/2018   K 3.9 10/31/2018   CL 100 10/31/2018   CREATININE 1.05 10/31/2018   BUN 15 10/31/2018   CO2 32 10/31/2018   TSH 1.45 05/24/2018   PSA 4.4 05/24/2018   INR 0.96 07/28/2014   HGBA1C 6.1 07/21/2017    Ct Angio Chest Pe W And/or Wo Contrast  Result Date: 10/22/2018 CLINICAL DATA:  Shortness of breath, chest tightness EXAM: CT ANGIOGRAPHY CHEST WITH CONTRAST TECHNIQUE: Multidetector CT imaging of the chest was performed using the standard protocol during bolus administration of intravenous contrast. Multiplanar CT image reconstructions and MIPs were obtained to evaluate the vascular anatomy. CONTRAST:  153mL OMNIPAQUE IOHEXOL 350 MG/ML SOLN COMPARISON:  None. FINDINGS: Cardiovascular: Satisfactory opacification of the pulmonary arteries to the segmental level. No evidence of pulmonary embolism. Cardiomegaly. Extensive 3 vessel coronary artery calcifications and/or stents. Left chest multi lead pacer. No pericardial effusion. Mediastinum/Nodes: No enlarged  mediastinal, hilar, or axillary lymph nodes. Thyroid gland, trachea, and esophagus demonstrate no significant findings. Lungs/Pleura: Bibasilar scarring or atelectasis. No pleural effusion or pneumothorax. Upper Abdomen: No acute abnormality. Postoperative findings of splenectomy. Musculoskeletal: No chest wall abnormality. No acute or significant osseous findings. Review of the MIP images confirms the above findings. IMPRESSION: 1.  Negative examination for pulmonary embolism. 2.  Bibasilar scarring or atelectasis. 3.  Cardiomegaly and coronary artery disease. Electronically Signed   By: Eddie Candle M.D.   On: 10/22/2018 16:14   Mr Thoracic Spine Wo  Contrast  Result Date: 10/23/2018 CLINICAL DATA:  Initial evaluation for acute sepsis. EXAM: MRI THORACIC AND LUMBAR SPINE WITHOUT CONTRAST TECHNIQUE: Multiplanar and multiecho pulse sequences of the thoracic and lumbar spine were obtained without intravenous contrast. COMPARISON:  None available. FINDINGS: MRI THORACIC SPINE FINDINGS Alignment: Market dextroscoliosis of the midthoracic spine. Grade 1 anterolisthesis of T2 on T3, likely chronic and degenerative. Alignment otherwise normal with preservation of the normal thoracic kyphosis. Vertebrae: Vertebral body heights maintained without evidence for acute or chronic fracture. Few scattered chronic endplate Schmorl's nodes noted within the lower thoracic spine. Bone marrow signal intensity within normal limits. No discrete or worrisome osseous lesions. No evidence for osteomyelitis discitis or septic arthritis. Cord: Signal intensity within the thoracic spinal cord is normal. Normal cord caliber and morphology. No epidural collections. Paraspinal and other soft tissues: Paraspinous soft tissues demonstrate no acute finding. Small layering right pleural effusion with associated atelectasis. Visualized visceral structures grossly within normal limits. Disc levels: T1-2: Bilateral facet hypertrophy, greater on the  right. No significant stenosis. T2-3: Grade 1 anterolisthesis. Bilateral facet hypertrophy. No spinal stenosis. Mild right foraminal narrowing. T3-4:  Negative interspace.  Mild facet hypertrophy.  No stenosis. T4-5: Negative interspace. Right greater than left facet hypertrophy. No significant stenosis. T5-6:  Negative interspace.  Mild facet hypertrophy.  No stenosis. T6-7: Negative interspace. Mild facet and ligament flavum hypertrophy. No significant stenosis. T7-8:  Negative interspace.  Mild facet hypertrophy.  No stenosis. T8-9:  Mild facet hypertrophy.  No stenosis. T9-10: Bilateral facet hypertrophy with ankylosis. No significant stenosis. T10-11:  Posterior element hypertrophy.  No stenosis. T11-12:  Bilateral facet hypertrophy.  No stenosis. T12-L1:  Negative. 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 L5-S1 disc somewhat rudimentary. Alignment: Trace 2 mm anterolisthesis of L3 on L4. Mild levoscoliosis. Alignment otherwise normal with preservation of the normal lumbar lordosis. Vertebrae: Vertebral body heights maintained without evidence for acute or chronic fracture. Bone marrow signal intensity mildly heterogeneous but within normal limits. No worrisome osseous lesions. Minimal reactive marrow edema about the bilateral facets at L2-3 through L4-5, likely due to facet arthritis, greater on the right. No evidence for osteomyelitis or discitis. Partially visualized SI joints within normal limits. Conus medullaris and cauda equina: Conus extends to the L1 level. Conus and cauda equina appear normal. Paraspinal and other soft tissues: Mild edema seen involving the right greater than left lower posterior paraspinous musculature (series 27, image 5), likely reactive in nature due to adjacent facet degeneration. Possible muscular strain and/or injury or acute myositis could also be considered. No loculated collections.  Paraspinous soft tissues demonstrate no other acute finding. Approximate 1 cm right renal cyst noted. Atherosclerotic change noted within the intra-abdominal aorta. Visualized visceral structures otherwise unremarkable. Disc levels: L1-2:  Unremarkable. L2-3: Mild disc bulge with disc desiccation. Moderate right with mild left facet hypertrophy. Superimposed 14 mm synovial cyst noted at the lateral aspect of the right L2-3 facet (series 29, image 10). This exhibits no mass effect on the adjacent thecal sac. Thecal sac. Resultant mild canal with right lateral recess narrowing. Mild right L2 foraminal stenosis. L3-4: Trace anterolisthesis. Mild disc bulge with disc desiccation. Moderate bilateral facet hypertrophy. Resultant mild canal with bilateral lateral recess stenosis, slightly worse on the right. Mild bilateral foraminal narrowing. L4-5: Disc desiccation with minimal annular bulge. Moderate facet hypertrophy. No significant stenosis. L5-S1: Transitional lumbosacral anatomy with rudimentary L5-S1 disc. Minimal endplate osteophytic spurring without significant disc bulge.  Mild facet hypertrophy. No significant stenosis. IMPRESSION: MR THORACIC SPINE IMPRESSION 1. No evidence for acute infection or other abnormality within the thoracic spine. 2. Moderate thoracic dextroscoliosis with associated multilevel facet hypertrophy as above. No significant spinal stenosis. Mild right foraminal narrowing at T2-3. 3. Small layering right pleural effusion with associated atelectasis. MR LUMBAR SPINE IMPRESSION 1. Mild paraspinous edema involving the right greater than left lower posterior paraspinous musculature, favored to be reactive in nature due to adjacent facet degeneration. Finding could result in right-sided lower back pain. Sequelae of muscular injury and/or strain or possibly myositis could also be considered. No discrete soft tissue collections. 2. No other evidence for acute infection within the lumbar spine. No  evidence for osteomyelitis or discitis. No epidural collections. 3. Mild for age multilevel disc bulging with moderate facet hypertrophy at L2-3 through L4-5 as above. Resultant mild canal with bilateral lateral recess narrowing at L2-3 and L3-4. Electronically Signed   By: Jeannine Boga M.D.   On: 10/23/2018 19:23   Mr Lumbar Spine Wo Contrast  Result Date: 10/23/2018 CLINICAL DATA:  Initial evaluation for acute sepsis. EXAM: MRI THORACIC AND LUMBAR SPINE WITHOUT CONTRAST TECHNIQUE: Multiplanar and multiecho pulse sequences of the thoracic and lumbar spine were obtained without intravenous contrast. COMPARISON:  None available. FINDINGS: MRI THORACIC SPINE FINDINGS Alignment: Market dextroscoliosis of the midthoracic spine. Grade 1 anterolisthesis of T2 on T3, likely chronic and degenerative. Alignment otherwise normal with preservation of the normal thoracic kyphosis. Vertebrae: Vertebral body heights maintained without evidence for acute or chronic fracture. Few scattered chronic endplate Schmorl's nodes noted within the lower thoracic spine. Bone marrow signal intensity within normal limits. No discrete or worrisome osseous lesions. No evidence for osteomyelitis discitis or septic arthritis. Cord: Signal intensity within the thoracic spinal cord is normal. Normal cord caliber and morphology. No epidural collections. Paraspinal and other soft tissues: Paraspinous soft tissues demonstrate no acute finding. Small layering right pleural effusion with associated atelectasis. Visualized visceral structures grossly within normal limits. Disc levels: T1-2: Bilateral facet hypertrophy, greater on the right. No significant stenosis. T2-3: Grade 1 anterolisthesis. Bilateral facet hypertrophy. No spinal stenosis. Mild right foraminal narrowing. T3-4:  Negative interspace.  Mild facet hypertrophy.  No stenosis. T4-5: Negative interspace. Right greater than left facet hypertrophy. No significant stenosis. T5-6:   Negative interspace.  Mild facet hypertrophy.  No stenosis. T6-7: Negative interspace. Mild facet and ligament flavum hypertrophy. No significant stenosis. T7-8:  Negative interspace.  Mild facet hypertrophy.  No stenosis. T8-9:  Mild facet hypertrophy.  No stenosis. T9-10: Bilateral facet hypertrophy with ankylosis. No significant stenosis. T10-11:  Posterior element hypertrophy.  No stenosis. T11-12:  Bilateral facet hypertrophy.  No stenosis. T12-L1:  Negative. 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 L5-S1 disc somewhat rudimentary. Alignment: Trace 2 mm anterolisthesis of L3 on L4. Mild levoscoliosis. Alignment otherwise normal with preservation of the normal lumbar lordosis. Vertebrae: Vertebral body heights maintained without evidence for acute or chronic fracture. Bone marrow signal intensity mildly heterogeneous but within normal limits. No worrisome osseous lesions. Minimal reactive marrow edema about the bilateral facets at L2-3 through L4-5, likely due to facet arthritis, greater on the right. No evidence for osteomyelitis or discitis. Partially visualized SI joints within normal limits. Conus medullaris and cauda equina: Conus extends to the L1 level. Conus and cauda equina appear normal. Paraspinal and other soft tissues: Mild edema seen involving the right greater than left lower posterior paraspinous  musculature (series 27, image 5), likely reactive in nature due to adjacent facet degeneration. Possible muscular strain and/or injury or acute myositis could also be considered. No loculated collections. Paraspinous soft tissues demonstrate no other acute finding. Approximate 1 cm right renal cyst noted. Atherosclerotic change noted within the intra-abdominal aorta. Visualized visceral structures otherwise unremarkable. Disc levels: L1-2:  Unremarkable. L2-3: Mild disc bulge with disc desiccation. Moderate right with  mild left facet hypertrophy. Superimposed 14 mm synovial cyst noted at the lateral aspect of the right L2-3 facet (series 29, image 10). This exhibits no mass effect on the adjacent thecal sac. Thecal sac. Resultant mild canal with right lateral recess narrowing. Mild right L2 foraminal stenosis. L3-4: Trace anterolisthesis. Mild disc bulge with disc desiccation. Moderate bilateral facet hypertrophy. Resultant mild canal with bilateral lateral recess stenosis, slightly worse on the right. Mild bilateral foraminal narrowing. L4-5: Disc desiccation with minimal annular bulge. Moderate facet hypertrophy. No significant stenosis. L5-S1: Transitional lumbosacral anatomy with rudimentary L5-S1 disc. Minimal endplate osteophytic spurring without significant disc bulge. Mild facet hypertrophy. No significant stenosis. IMPRESSION: MR THORACIC SPINE IMPRESSION 1. No evidence for acute infection or other abnormality within the thoracic spine. 2. Moderate thoracic dextroscoliosis with associated multilevel facet hypertrophy as above. No significant spinal stenosis. Mild right foraminal narrowing at T2-3. 3. Small layering right pleural effusion with associated atelectasis. MR LUMBAR SPINE IMPRESSION 1. Mild paraspinous edema involving the right greater than left lower posterior paraspinous musculature, favored to be reactive in nature due to adjacent facet degeneration. Finding could result in right-sided lower back pain. Sequelae of muscular injury and/or strain or possibly myositis could also be considered. No discrete soft tissue collections. 2. No other evidence for acute infection within the lumbar spine. No evidence for osteomyelitis or discitis. No epidural collections. 3. Mild for age multilevel disc bulging with moderate facet hypertrophy at L2-3 through L4-5 as above. Resultant mild canal with bilateral lateral recess narrowing at L2-3 and L3-4. Electronically Signed   By: Jeannine Boga M.D.   On: 10/23/2018  19:23   Dg Chest Portable 1 View  Result Date: 10/22/2018 CLINICAL DATA:  Dyspnea and shortness of breath EXAM: PORTABLE CHEST 1 VIEW COMPARISON:  08/13/2018 FINDINGS: Cardiac shadow is enlarged but accentuated by the portable technique. Pacing device is noted. No focal infiltrate or sizable effusion is seen. No acute bony abnormality is noted. IMPRESSION: No acute abnormality noted. Electronically Signed   By: Inez Catalina M.D.   On: 10/22/2018 11:48    Assessment & Plan:   Joon was seen today for groin swelling.  Diagnoses and all orders for this visit:  Right epididymitis- His urinalysis is normal.  I will treat him empirically for epididymo-orchitis with Levaquin.  Will control the pain with oxycodone, acetaminophen, and Phenergan as needed. -     US SCROTUM; Future -     Urinalysis, Routine w reflex microscopic; Future -     CULTURE, URINE COMPREHENSIVE; Future -     levofloxacin (LEVAQUIN) 500 MG tablet; Take 1 tablet (500 mg total) by mouth daily for 10 days. -     oxyCODONE-acetaminophen (PERCOCET) 7.5-325 MG tablet; Take 1 tablet by mouth every 6 (six) hours as needed. -     promethazine (PHENERGAN) 12.5 MG tablet; Take 1 tablet (12.5 mg total) by mouth every 6 (six) hours as needed for nausea or vomiting.  Testicular mass- I have asked him to undergo an ultrasound to see if there is concern for malignancy. -  US SCROTUM; Future -     Urinalysis, Routine w reflex microscopic; Future -     CULTURE, URINE COMPREHENSIVE; Future   I am having [name] start on levofloxacin, oxyCODONE-acetaminophen, and promethazine. I am also having him maintain his thyroid, acidophilus, Dexilant, Nutritional Supplements (DHEA PO), liothyronine, anastrozole, aspirin EC, tamsulosin, fluticasone, Pitavastatin Calcium, hydrALAZINE, clopidogrel, cephALEXin, ezetimibe, and acyclovir.  Meds ordered this encounter  Medications   levofloxacin (LEVAQUIN) 500 MG tablet    Sig: Take 1 tablet  (500 mg total) by mouth daily for 10 days.    Dispense:  10 tablet    Refill:  0   oxyCODONE-acetaminophen (PERCOCET) 7.5-325 MG tablet    Sig: Take 1 tablet by mouth every 6 (six) hours as needed.    Dispense:  30 tablet    Refill:  0   promethazine (PHENERGAN) 12.5 MG tablet    Sig: Take 1 tablet (12.5 mg total) by mouth every 6 (six) hours as needed for nausea or vomiting.    Dispense:  30 tablet    Refill:  0     Follow-up: Return in about 3 weeks (around 12/17/2018).  Scarlette Calico, MD

## 2018-11-27 ENCOUNTER — Telehealth: Payer: Self-pay

## 2018-11-27 NOTE — Telephone Encounter (Signed)
Spoke to pt regarding Dr. Curt Bears recommendations. Pt understands to hold Plavix for 5 days, starting today (11/27/2018). Pt agreeable to come in for wound recheck 12/03/2018.

## 2018-11-28 LAB — CULTURE, URINE COMPREHENSIVE
MICRO NUMBER:: 598256
RESULT:: NO GROWTH
SPECIMEN QUALITY:: ADEQUATE

## 2018-12-03 ENCOUNTER — Other Ambulatory Visit: Payer: Self-pay

## 2018-12-03 ENCOUNTER — Ambulatory Visit (INDEPENDENT_AMBULATORY_CARE_PROVIDER_SITE_OTHER): Payer: Medicare Other | Admitting: *Deleted

## 2018-12-03 DIAGNOSIS — I495 Sick sinus syndrome: Secondary | ICD-10-CM

## 2018-12-03 NOTE — Patient Instructions (Signed)
Wait for hone cal from [facility] before resuming Plavix.

## 2018-12-03 NOTE — Progress Notes (Signed)
Wound re-check. Wound on Left cx well healed with approximated edges , no s/sx of infection.Wound on right upper cx with decreased edema from previous visit, picture taken with pt permission. o s/sx of infection noted.  Will contact pt later today after consulting with Dr Curt Bears concerning resuming Plavix.

## 2018-12-04 ENCOUNTER — Telehealth: Payer: Self-pay | Admitting: Cardiology

## 2018-12-04 NOTE — Telephone Encounter (Signed)
Patient instructed to resume Plavix per Dr Curt Bears.

## 2018-12-04 NOTE — Telephone Encounter (Signed)
LMOM to call clinic.  Pt to resume Plavix .

## 2018-12-12 DIAGNOSIS — H35013 Changes in retinal vascular appearance, bilateral: Secondary | ICD-10-CM | POA: Diagnosis not present

## 2018-12-12 DIAGNOSIS — H43391 Other vitreous opacities, right eye: Secondary | ICD-10-CM | POA: Diagnosis not present

## 2018-12-12 DIAGNOSIS — Z961 Presence of intraocular lens: Secondary | ICD-10-CM | POA: Diagnosis not present

## 2018-12-12 DIAGNOSIS — H34211 Partial retinal artery occlusion, right eye: Secondary | ICD-10-CM | POA: Diagnosis not present

## 2018-12-16 ENCOUNTER — Encounter: Payer: Self-pay | Admitting: Internal Medicine

## 2018-12-31 ENCOUNTER — Ambulatory Visit: Payer: Medicare Other | Admitting: Internal Medicine

## 2019-01-01 ENCOUNTER — Telehealth: Payer: Self-pay | Admitting: Internal Medicine

## 2019-01-01 NOTE — Telephone Encounter (Signed)
COVID-19 Pre-Screening Questions:  Do you currently have a fever (>100 F), chills or unexplained body aches? N   Are you currently experiencing new cough, shortness of breath, sore throat, runny nose? N   Have you recently travelled outside the state of [place] in the last 14 days? N    Have you been in contact with someone that is currently pending confirmation of Covid19 testing or has been confirmed to have the [place] virus?  N  **If the patient answers NO to ALL questions -  advise the patient to please call the clinic before coming to the office should any symptoms develop.    1.

## 2019-01-02 ENCOUNTER — Other Ambulatory Visit: Payer: Self-pay | Admitting: Internal Medicine

## 2019-01-02 ENCOUNTER — Other Ambulatory Visit: Payer: Self-pay

## 2019-01-02 ENCOUNTER — Ambulatory Visit (INDEPENDENT_AMBULATORY_CARE_PROVIDER_SITE_OTHER): Payer: Medicare Other | Admitting: Internal Medicine

## 2019-01-02 DIAGNOSIS — R7881 Bacteremia: Secondary | ICD-10-CM | POA: Diagnosis not present

## 2019-01-02 DIAGNOSIS — B955 Unspecified streptococcus as the cause of diseases classified elsewhere: Secondary | ICD-10-CM | POA: Diagnosis not present

## 2019-01-02 DIAGNOSIS — H43811 Vitreous degeneration, right eye: Secondary | ICD-10-CM | POA: Diagnosis not present

## 2019-01-02 DIAGNOSIS — I2510 Atherosclerotic heart disease of native coronary artery without angina pectoris: Secondary | ICD-10-CM

## 2019-01-02 DIAGNOSIS — H34211 Partial retinal artery occlusion, right eye: Secondary | ICD-10-CM | POA: Diagnosis not present

## 2019-01-02 DIAGNOSIS — H35013 Changes in retinal vascular appearance, bilateral: Secondary | ICD-10-CM | POA: Diagnosis not present

## 2019-01-02 DIAGNOSIS — Z961 Presence of intraocular lens: Secondary | ICD-10-CM | POA: Diagnosis not present

## 2019-01-02 DIAGNOSIS — E785 Hyperlipidemia, unspecified: Secondary | ICD-10-CM

## 2019-01-02 MED ORDER — LIVALO 4 MG PO TABS: 1.0000 | ORAL_TABLET | Freq: Every day | ORAL | 1 refills | Status: DC

## 2019-01-02 NOTE — Progress Notes (Signed)
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   Routine general medical examination at a health care facility 06/29/2017   PSA elevation 06/26/2017   Benign prostatic hyperplasia without lower urinary tract symptoms 05/21/2017   Seasonal allergic rhinitis due to pollen 05/18/2017   Chronic renal disease, stage 2, mildly decreased glomerular filtration rate (GFR) between 60-89 mL/min/1.73 square meter 84/13/2440   Systolic murmur 04/01/2535   Gastroesophageal reflux disease without esophagitis 12/19/2015   Essential hypertension, benign 12/19/2015   Hyperlipidemia with target LDL less than 70 12/19/2015   OA (osteoarthritis) of knee 08/03/2014   Coronary artery disease 07/10/2014    Patient's Medications  New Prescriptions   No medications on file  Previous Medications   ACIDOPHILUS (RISAQUAD) CAPS CAPSULE    Take 1 capsule by mouth 2 (two) times daily.   ACYCLOVIR (ZOVIRAX) 200 MG CAPSULE    TAKE 1 CAPSULE (200 MG TOTAL) BY MOUTH 3 (THREE) TIMES DAILY.   ANASTROZOLE (ARIMIDEX) 1 MG TABLET    Take 1 mg by mouth 2 (two) times a week.    ASPIRIN EC 81 MG TABLET    Take 1 tablet (81 mg total) by mouth daily.   CLOPIDOGREL (PLAVIX) 75 MG TABLET    Take 1 tablet (75 mg total) by mouth daily.   DEXILANT 60 MG CAPSULE    Take 1 capsule by mouth daily.   EZETIMIBE (ZETIA) 10 MG TABLET    Take 1 tablet (10 mg total) by mouth daily.   FLUTICASONE (FLONASE) 50 MCG/ACT NASAL SPRAY    USE DAILY AS DIRECTED   HYDRALAZINE (APRESOLINE) 25 MG TABLET    Take 1 tablet (25 mg  total) by mouth 3 (three) times daily for 30 days.   LIOTHYRONINE (CYTOMEL) 25 MCG TABLET    Take 25 mcg by mouth every morning.    NUTRITIONAL SUPPLEMENTS (DHEA PO)    Take 25 mg by mouth.   OXYCODONE-ACETAMINOPHEN (PERCOCET) 7.5-325 MG TABLET    Take 1 tablet by mouth every 6 (six) hours as needed.   PITAVASTATIN CALCIUM (LIVALO) 4 MG TABS    Take 1 tablet (4 mg total) by mouth daily.   PROMETHAZINE (PHENERGAN) 12.5 MG TABLET    Take 1 tablet (12.5 mg total) by mouth every 6 (six) hours as needed for nausea or vomiting.   TAMSULOSIN (FLOMAX) 0.4 MG CAPS CAPSULE    Take 0.4 mg by mouth daily after breakfast.    THYROID (ARMOUR) 60 MG TABLET    Take 60-120 mg by mouth 2 (two) times daily.   Modified Medications   No medications on file  Discontinued Medications   CEPHALEXIN (KEFLEX) 500 MG CAPSULE    Take 1 capsule (500 mg total) by mouth 3 (three) times daily.    Subjective: [name] Glass is in for his routine follow-up visit.  He completed 5 weeks of antibiotic therapy 1 month ago for his strep gallolyticus bacteremia and pacemaker infection.  He did not have any problems tolerating his antibiotics.  He is feeling much better.  Now walking a mile each  day and stamina is definitely improved.  Review of Systems: Review of Systems  Constitutional: Negative for fever.  Respiratory: Negative for cough and shortness of breath.   Cardiovascular: Negative for chest pain.  Gastrointestinal: Negative for abdominal pain, diarrhea, nausea and vomiting.  Musculoskeletal: Positive for back pain.       He has not had any further severe back pain but does have his very mild chronic low back discomfort.    Past Medical History:  Diagnosis Date   Arthritis    Coronary artery disease    GERD (gastroesophageal reflux disease)    H/O bronchitis    H/O hiatal hernia    History of kidney stones last in 1976   Hyperlipidemia    statin intolerant, under control   Hypertension    under control    Hypothyroidism    Sleep apnea    hx of, surgery to reverse    Social History   Tobacco Use   Smoking status: Never Smoker   Smokeless tobacco: Never Used  Substance Use Topics   Alcohol use: Yes    Alcohol/week: 4.0 standard drinks    Types: 4 Standard drinks or equivalent per week    Comment: 3 drinks at night    Drug use: No    Family History  Problem Relation Age of Onset   Heart attack Father 56       lived to 58   Alzheimer's disease Mother    Stroke Mother    Breast cancer Mother    Alzheimer's disease Maternal Grandmother    Stroke Maternal Grandmother    Lung cancer Maternal Grandmother    Lung cancer Maternal Grandfather    Cancer Paternal Grandmother        type unknown   Diabetes Paternal Grandfather    Other Paternal Grandfather        Growth in the back of the head    Allergies  Allergen Reactions   Morphine And Related     Had "crazy dreams" felt "crazy" per pt.   Bactrim [Sulfamethoxazole-Trimethoprim]    Statins     Objective: There were no vitals filed for this visit. There is no height or weight on file to calculate BMI.  Physical Exam Constitutional:      Comments: He is in good spirits.  Cardiovascular:     Rate and Rhythm: Normal rate and regular rhythm.     Heart sounds: No murmur.  Pulmonary:     Effort: Pulmonary effort is normal.     Breath sounds: Normal breath sounds.  Chest:    Psychiatric:        Mood and Affect: Mood normal.     Lab Results    Problem List Items Addressed This Visit      High   Streptococcal Gallolyticus Bacteremia    His strep bacteremia has now been cured with a combination of antibiotic therapy and removal of his old pacemaker.  He can follow-up here as needed.          Michel Bickers, MD [facility] for [facility] [place] [facility] [phone] pager   [phone] cell 01/02/2019, 11:15 AM

## 2019-01-02 NOTE — Assessment & Plan Note (Signed)
His strep bacteremia has now been cured with a combination of antibiotic therapy and removal of his old pacemaker.  He can follow-up here as needed.

## 2019-01-06 NOTE — Progress Notes (Signed)
Cardiology Office Note   Date:  01/07/2019   ID:  [name] Glass, DOB May 26, 1941, MRN [id number]  PCP:  [name] Lima, MD  Cardiologist: Dr.Berry Electrophysiologist: Dr. Caryl Comes  CC: Hospital Follow Up   History of Present Illness: [name] Glass is a 78 y.o. 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  The patient was seen during recent hospitalization for evaluation and concerns for pacemaker infection at the request of Dr. Angelena Form  [name] while in [place] suffered a syncopal event, was hospitalized underwent catheterization atherectomy of his LAD with a DES placement.He also has sinus node dysfunction underwent pacemaker implantation during that hospitalization.  Dr. Caryl Comes saw the pt in Jan to establish EP/Pacer follow up, described his wound as healing, as "not completely" and removed every other staple, f/u visit with removal of the rest of the staples, Right medial side of incision has a small scab and overlap of skin, f/u 2/18 noted good healing. 10/04/2018 patient called with concerns of area medial to his incision that was red, firm and painful, planned to come in for Dr. Lovena Le to assess.  He came in, there was no drainage, no reports of fever/chills, was advised if he developed any to notify us with a 4 week f/u planned.  The patient was admitted to [facility] on 10/22/2018 with productive cough dyspnea and fever.  He was found to be hypoxic requiring 2 L of oxygen via nasal cannula.  COVID test was completed and found to be negative.  He was admitted with sepsis syndrome and acute bronchitis.  Blood cultures were drawn and he was found to be positive for strep.  There were concerns about infected pacemaker pocket which may be complicated by acute lumbar vertebral infection consistent with low back pain.   A TEE was completed which made no mention of vegetation, the pacemaker pocket was examined and looked free of infection. After discussion with EP, he patient was planned for a pacemaker removal with temporary pacing in the interim.  Pacemaker was removed on 10/25/2018.  He wad continued on IV antibiotics for 3 days, became afebrile and was deemed ready to have a new pacemaker implant.  New pacemaker was placed on 10/28/2018 with Medtronic Azor XT DR MRI SureScan dual-chamber pacemaker placed by Dr. Curt Bears, placed in the right chest.   He had a wound check on 11/12/2018 with pacemaker interrogation.  Incision was looking healthy and wound was healing well without evidence of infection.  The pacemaker had normal device function.  He comes today without complaints. He is feeling much better, is walking 1.5 miles a day, sometimes more.  He is not having dizziness, fatigue, or DOE. He continues to travel and be active.   Past Medical History:  Diagnosis Date  . Arthritis   . Coronary artery disease   . GERD (gastroesophageal reflux disease)   . H/O bronchitis   . H/O hiatal hernia   . History of kidney stones last in 1976  . Hyperlipidemia    statin intolerant, under control  . Hypertension    under control  . Hypothyroidism   . Sleep apnea    hx of, surgery to reverse    Past Surgical History:  Procedure Laterality Date  . CARDIAC CATHETERIZATION Right 2008   5 heart stents  . CATARACT EXTRACTION Bilateral 6 years ago  . CYSTOSCOPY  N/A 08/06/2012   Procedure: CYSTOSCOPY;  Surgeon: [name], MD;  Location: [facility];  Service: Urology;  Laterality: N/A;  . ESOPHAGOGASTRIC FUNDOPLICATION  2951   spleen removed, 5 pints of blood given  . HERNIA REPAIR  1985   x4  . INGUINAL HERNIA REPAIR  1992  . PACEMAKER IMPLANT N/A 10/30/2018   Procedure: PACEMAKER IMPLANT;  Surgeon: Constance Haw, MD;  Location: [place] [facility];  Service: Cardiovascular;  Laterality: N/A;  . PACEMAKER  INSERTION    . PPM GENERATOR REMOVAL N/A 10/25/2018   Procedure: PPM GENERATOR REMOVAL;  Surgeon: Constance Haw, MD;  Location: [place] [facility];  Service: Cardiovascular;  Laterality: N/A;  . PROSTATE ABLATION  2013   "laser ablation"  . TEE WITHOUT CARDIOVERSION N/A 10/25/2018   Procedure: TRANSESOPHAGEAL ECHOCARDIOGRAM (TEE);  Surgeon: Acie Fredrickson Wonda Cheng, MD;  Location: [facility];  Service: Cardiovascular;  Laterality: N/A;  . TEMPORARY PACEMAKER N/A 10/25/2018   Procedure: TEMPORARY PACEMAKER;  Surgeon: Constance Haw, MD;  Location: [place] [facility];  Service: Cardiovascular;  Laterality: N/A;  . TONSILLECTOMY  as child  . TOTAL KNEE ARTHROPLASTY Right 08/03/2014   Procedure: TOTAL RIGHT KNEE ARTHROPLASTY;  Surgeon: [name], MD;  Location: [facility];  Service: Orthopedics;  Laterality: Right;  . TRANSURETHRAL RESECTION OF PROSTATE N/A 08/06/2012   Procedure: [place] Free OF PROSTATE WITH GYRUS ;  Surgeon: [name], MD;  Location: [facility];  Service: Urology;  Laterality: N/A;  . UMBILICAL HERNIA REPAIR  1990  . UVULECTOMY  2005   for sleep apnea  . UVULOPALATOPHARYNGOPLASTY  6 years ago     Current Outpatient Medications  Medication Sig Dispense Refill  . acidophilus (RISAQUAD) CAPS capsule Take 1 capsule by mouth 2 (two) times daily.    [name] acyclovir (ZOVIRAX) 200 MG capsule TAKE 1 CAPSULE (200 MG TOTAL) BY MOUTH 3 (THREE) TIMES DAILY. 270 capsule 0  . anastrozole (ARIMIDEX) 1 MG tablet Take 1 mg by mouth 2 (two) times a week.     [name] aspirin EC 81 MG tablet Take 1 tablet (81 mg total) by mouth daily. 90 tablet 3  . clopidogrel (PLAVIX) 75 MG tablet Take 1 tablet (75 mg total) by mouth daily. 90 tablet 0  . DEXILANT 60 MG capsule Take 1 capsule by mouth daily.  1  . ezetimibe (ZETIA) 10 MG tablet Take 1 tablet (10 mg total) by mouth daily. 90 tablet 1  . fluticasone (FLONASE) 50 MCG/ACT nasal spray USE DAILY AS DIRECTED (Patient taking differently: Place 2 sprays  into both nostrils daily. ) 48 g 1  . hydrALAZINE (APRESOLINE) 25 MG tablet TAKE 1 TABLET BY MOUTH 3 TIMES A DAY 270 tablet 1  . liothyronine (CYTOMEL) 25 MCG tablet Take 25 mcg by mouth every morning.   3  . Nutritional Supplements (DHEA PO) Take 25 mg by mouth.    . Pitavastatin Calcium (LIVALO) 4 MG TABS Take 1 tablet (4 mg total) by mouth daily. 90 tablet 1  . tamsulosin (FLOMAX) 0.4 MG CAPS capsule Take 0.4 mg by mouth daily after breakfast.   5  . thyroid (ARMOUR) 60 MG tablet Take 60-120 mg by mouth 2 (two) times daily.      No current facility-administered medications for this visit.     Allergies:   Morphine and related, Bactrim [sulfamethoxazole-trimethoprim], and Statins    Social History:  The patient  reports that he has never smoked. He has never used smokeless  tobacco. He reports current alcohol use of about 4.0 standard drinks of alcohol per week. He reports that he does not use drugs.   Family History:  The patient's family history includes Alzheimer's disease in his maternal grandmother and mother; Breast cancer in his mother; Cancer in his paternal grandmother; Diabetes in his paternal grandfather; Heart attack (age of onset: 4) in his father; Lung cancer in his maternal grandfather and maternal grandmother; Other in his paternal grandfather; Stroke in his maternal grandmother and mother.    ROS: All other systems are reviewed and negative. Unless otherwise mentioned in H&P    PHYSICAL EXAM: VS:  BP 122/64   Temp 97.7 F (36.5 C)   Ht 6' (1.829 m)   Wt 211 lb (95.7 kg)   BMI 28.62 kg/m  , BMI Body mass index is 28.62 kg/m. GEN: Well nourished, well developed, in no acute distress HEENT: normal Neck: no JVD, carotid bruits, or masses Cardiac: RRR, 1/6 systolic murmur, rubs, or gallops,no edema  Respiratory:  Clear to auscultation bilaterally, normal work of breathing GI: soft, nontender, nondistended, + BS MS: no deformity or atrophy Skin: warm and dry, no  rash. Wound on left upper chest is healed with darkened skin noted, Right upper chest wound is very well healed,  Neuro:  Strength and sensation are intact, mild tremors, Psych: euthymic mood, full affect   EKG: Not completed during this office visit.   Recent Labs: 05/24/2018: TSH 1.45 10/22/2018: B Natriuretic Peptide 60.5 10/30/2018: ALT 33 10/31/2018: BUN 15; Creatinine, Ser 1.05; Hemoglobin 15.4; Magnesium 1.8; Platelets 317; Potassium 3.9; Sodium 139    Lipid Panel    Component Value Date/Time   CHOL 156 09/04/2018 0957   TRIG 179 (H) 09/04/2018 0957   HDL 38 (L) 09/04/2018 0957   CHOLHDL 4.1 09/04/2018 0957   CHOLHDL 4 06/22/2017 1116   VLDL 27.4 06/22/2017 1116   LDLCALC 82 09/04/2018 0957      Wt Readings from Last 3 Encounters:  01/07/19 211 lb (95.7 kg)  11/26/18 202 lb (91.6 kg)  11/20/18 202 lb (91.6 kg)      Other studies Reviewed: 10/23/2018: TTE IMPRESSIONS 1. The left ventricle has low normal systolic function, with an ejection fraction of 50-55%. The cavity size was normal. There is moderate concentric left ventricular hypertrophy. Left ventricular diastolic Doppler parameters are consistent with  pseudonormalization. Indeterminate filling pressures. 2. The right ventricle has normal systolic function. The cavity was normal. There is no increase in right ventricular wall thickness. Right ventricular systolic pressure could not be assessed. 3. Left atrial size was severely dilated. 4. The aortic valve has an indeterminate number of cusps. Moderate thickening of the aortic valve. Moderate calcification of the aortic valve. Aortic valve regurgitation is mild by color flow Doppler. Mild stenosis of the aortic valve. 5. The inferior vena cava was dilated in size with <50% respiratory variability.  FINDINGS Left Ventricle: The left ventricle has low normal systolic function, with an ejection fraction of 50-55%. The cavity size was normal. There is moderate  concentric left ventricular hypertrophy. Left ventricular diastolic Doppler parameters are consistent with pseudonormalization. Indeterminate filling pressures  Right Ventricle: The right ventricle has normal systolic function. The cavity was normal. There is no increase in right ventricular wall thickness. Right ventricular systolic pressure could not be assessed.  Left Atrium: Left atrial size was severely dilated.  Right Atrium: Right atrial size was normal in size. Right atrial pressure is estimated at 10 mmHg.  Interatrial Septum:  No atrial level shunt detected by color flow Doppler.  Pericardium: There is no evidence of pericardial effusion.  Mitral Valve: The mitral valve is normal in structure. Mitral valve regurgitation is mild by color flow Doppler.  Tricuspid Valve: The tricuspid valve is normal in structure. Tricuspid valve regurgitation is trivial by color flow Doppler.  Aortic Valve: The aortic valve has an indeterminate number of cusps Moderate thickening of the aortic valve, with moderately decreased cusp excursion. Moderate calcification of the aortic valve. Aortic valve regurgitation is mild by color flow Doppler.  There is Mild stenosis of the aortic valve.  Pulmonic Valve: The pulmonic valve was normal in structure. Pulmonic valve regurgitation was not assessed by color flow Doppler.  Venous: The inferior vena cava is dilated in size with less than 50% respiratory variability.    ASSESSMENT AND PLAN:  1.Pacemaker pocket infection: Strep bacteremia. This required explant with IV antibiotics. He had new Medtronic  Azor XT DR MRI Sure Scan Dual Chamber PPM, placed on 10/28/2018 in the right chest. He is doing well with no new symptoms.  Is to follow up with EP for in person pacemaker check on January 30, 2019.   2. CAD; Stent to the LAD in January of 2020 requiring a DES placement after arthrectomy. He remains on Plavix. He is intolerant of Brilinta causing  significant dyspnea.   3. Hypercholesterolemia: On Livalo and Zetia.  Goal of LDL < 70. He sees a PCP in [place] who has just drawn labs 2 weeks ago. No changes in his regimen at this time.   4. Thyroid Disease:  Continues on Armour 60 -120 mg BID. Followed by PCP.   Current medicines are reviewed at length with the patient today.    Labs/ tests ordered today include: None  Phill Myron. [place] Pugh, ANP, AACC   01/07/2019 2:21 PM    [facility] [place] [address] Office [phone] Fax [phone]

## 2019-01-07 ENCOUNTER — Other Ambulatory Visit: Payer: Self-pay

## 2019-01-07 ENCOUNTER — Ambulatory Visit (INDEPENDENT_AMBULATORY_CARE_PROVIDER_SITE_OTHER): Payer: Medicare Other | Admitting: Adult Health

## 2019-01-07 ENCOUNTER — Encounter: Payer: Self-pay | Admitting: Adult Health

## 2019-01-07 VITALS — BP 122/64 | Temp 97.7°F | Ht 72.0 in | Wt 211.0 lb

## 2019-01-07 DIAGNOSIS — I2510 Atherosclerotic heart disease of native coronary artery without angina pectoris: Secondary | ICD-10-CM

## 2019-01-07 DIAGNOSIS — E7800 Pure hypercholesterolemia, unspecified: Secondary | ICD-10-CM

## 2019-01-07 DIAGNOSIS — Z950 Presence of cardiac pacemaker: Secondary | ICD-10-CM

## 2019-01-07 NOTE — Patient Instructions (Signed)
Medication Instructions:  Continue current medications  If you need a refill on your cardiac medications before your next appointment, please call your pharmacy.  Labwork: None Ordered   Testing/Procedures: None Ordered  Follow-Up: You will need a follow up appointment in 6 months.  Please call our office 2 months in advance to schedule this appointment.  You may see Dr [name] or one of the following Advanced Practice Providers on your designated Care Team:   Kerin Ransom, PA-C Roby Lofts, [place] . Sande Rives, PA-C     At [facility], you and your health needs are our priority.  As part of our continuing mission to provide you with exceptional heart care, we have created designated Provider Care Teams.  These Care Teams include your primary Cardiologist (physician) and Advanced Practice Providers (APPs -  Physician Assistants and Nurse Practitioners) who all work together to provide you with the care you need, when you need it.  Thank you for choosing [facility] at [facility]!!

## 2019-01-08 ENCOUNTER — Other Ambulatory Visit: Payer: Self-pay | Admitting: Internal Medicine

## 2019-01-08 DIAGNOSIS — I2510 Atherosclerotic heart disease of native coronary artery without angina pectoris: Secondary | ICD-10-CM

## 2019-01-08 DIAGNOSIS — E785 Hyperlipidemia, unspecified: Secondary | ICD-10-CM

## 2019-01-08 MED ORDER — LIVALO 4 MG PO TABS: 1.0000 | ORAL_TABLET | Freq: Every day | ORAL | 1 refills | Status: DC

## 2019-01-15 ENCOUNTER — Other Ambulatory Visit: Payer: Self-pay | Admitting: Internal Medicine

## 2019-01-23 ENCOUNTER — Encounter (INDEPENDENT_AMBULATORY_CARE_PROVIDER_SITE_OTHER): Payer: Medicare Other | Admitting: Ophthalmology

## 2019-01-24 ENCOUNTER — Encounter (INDEPENDENT_AMBULATORY_CARE_PROVIDER_SITE_OTHER): Payer: Self-pay | Admitting: Ophthalmology

## 2019-01-24 ENCOUNTER — Ambulatory Visit (INDEPENDENT_AMBULATORY_CARE_PROVIDER_SITE_OTHER): Payer: Medicare Other | Admitting: Ophthalmology

## 2019-01-24 ENCOUNTER — Telehealth (INDEPENDENT_AMBULATORY_CARE_PROVIDER_SITE_OTHER): Payer: Self-pay

## 2019-01-24 ENCOUNTER — Other Ambulatory Visit: Payer: Self-pay

## 2019-01-24 DIAGNOSIS — H33011 Retinal detachment with single break, right eye: Secondary | ICD-10-CM

## 2019-01-24 DIAGNOSIS — H3581 Retinal edema: Secondary | ICD-10-CM | POA: Diagnosis not present

## 2019-01-24 DIAGNOSIS — H35033 Hypertensive retinopathy, bilateral: Secondary | ICD-10-CM | POA: Diagnosis not present

## 2019-01-24 DIAGNOSIS — I10 Essential (primary) hypertension: Secondary | ICD-10-CM | POA: Diagnosis not present

## 2019-01-24 DIAGNOSIS — Z961 Presence of intraocular lens: Secondary | ICD-10-CM

## 2019-01-24 HISTORY — PX: RETINAL DETACHMENT SURGERY: SHX105

## 2019-01-24 MED ORDER — MAXITROL 3.5-10000-0.1 OP OINT: 1.0000 "application " | TOPICAL_OINTMENT | Freq: Four times a day (QID) | OPHTHALMIC | 3 refills | Status: DC

## 2019-01-24 NOTE — Progress Notes (Signed)
[facility] Note  01/24/2019     CHIEF COMPLAINT Patient presents for Retina Evaluation   HISTORY OF PRESENT ILLNESS: Glass Glass is a 78 y.o. male who presents to the clinic today for:   HPI    Retina Evaluation    In right eye.  This started 6 weeks ago.  Duration of 6 weeks.  Associated Symptoms Floaters and Blind Spot.  Negative for Flashes, Photophobia, Scalp Tenderness, Fever, Pain, Glare, Jaw Claudication, Weight Loss, Distortion, Redness, Trauma, Shoulder/Hip pain and Fatigue.  Context:  distance vision and mid-range vision.  Treatments tried include no treatments.  I, the attending physician,  performed the HPI with the patient and updated documentation appropriately.          Comments    Patient states sudden onset of floaters OD, looked like fiber in vision at first. This started 6 weeks ago. Then, last 5-6 days curtain in vision OD. Patient can't see through curtain. Patient had blockage in heart that required 5 stints in January 2020. Patient is on plavix.        Last edited by Glass Caffey, MD on 01/24/2019  1:25 PM. (History)    pt states about 6 weeks ago he was wearing a mask and thought he got a piece of fiber in his eye, he states he called Dr. Payton Emerald office and got an appt with Dr. Parke Simmers who told him to come back in 3 weeks, pt states when he went back in 3 weeks not much had changed, he states Glass Glass told him to come back in 6 months, he states 6 days ago he started noticing a shadow in his vision -- inf nasal visual field -- progressively enlarged. Central vision affected yesterday, 8.20.20  Referring physician: [name], Glass, Glass Glass,  Glass Glass [id number]  HISTORICAL INFORMATION:   Selected notes from the MEDICAL RECORD NUMBER    CURRENT MEDICATIONS: Current Outpatient Medications (Ophthalmic Drugs)  Medication Sig  . neomycin-polymyxin-dexameth (MAXITROL) 0.1 % OINT Place 1 application into the  right eye 4 (four) times daily.   No current facility-administered medications for this visit.  (Ophthalmic Drugs)   Current Outpatient Medications (Other)  Medication Sig  . acidophilus (RISAQUAD) CAPS capsule Take 1 capsule by mouth 2 (two) times daily.  [name] acyclovir (ZOVIRAX) 200 MG capsule TAKE 1 CAPSULE (200 MG TOTAL) BY MOUTH 3 (THREE) TIMES DAILY.  [name] anastrozole (ARIMIDEX) 1 MG tablet Take 1 mg by mouth 2 (two) times a week.   [name] aspirin EC 81 MG tablet Take 1 tablet (81 mg total) by mouth daily.  . clopidogrel (PLAVIX) 75 MG tablet Take 1 tablet (75 mg total) by mouth daily.  [name] DEXILANT 60 MG capsule Take 1 capsule by mouth daily.  [name] ezetimibe (ZETIA) 10 MG tablet Take 1 tablet (10 mg total) by mouth daily.  . fluticasone (FLONASE) 50 MCG/ACT nasal spray Place 2 sprays into both nostrils daily.  . hydrALAZINE (APRESOLINE) 25 MG tablet TAKE 1 TABLET BY MOUTH 3 TIMES A DAY  . liothyronine (CYTOMEL) 25 MCG tablet Take 25 mcg by mouth every morning.   . Nutritional Supplements (DHEA PO) Take 25 mg by mouth.  . Pitavastatin Calcium (LIVALO) 4 MG TABS Take 1 tablet (4 mg total) by mouth daily.  . tamsulosin (FLOMAX) 0.4 MG CAPS capsule Take 0.4 mg by mouth daily after breakfast.   . thyroid (ARMOUR) 60 MG tablet Take 60-120 mg by mouth 2 (two) times  daily.    No current facility-administered medications for this visit.  (Other)      REVIEW OF SYSTEMS: ROS    Positive for: Musculoskeletal, Endocrine, Cardiovascular, Eyes, Respiratory   Negative for: Constitutional, Gastrointestinal, Neurological, Skin, Genitourinary, HENT, Psychiatric, Allergic/Imm, Heme/Lymph   Last edited by Roselee Nova D on 01/24/2019  9:30 AM. (History)       ALLERGIES Allergies  Allergen Reactions  . Morphine And Related     Had "crazy dreams" felt "crazy" per pt.  . Bactrim [Sulfamethoxazole-Trimethoprim]   . Statins     PAST MEDICAL HISTORY Past Medical History:  Diagnosis Date  . Arthritis   .  Coronary artery disease   . GERD (gastroesophageal reflux disease)   . H/O bronchitis   . H/O hiatal hernia   . History of kidney stones last in 1976  . Hyperlipidemia    statin intolerant, under control  . Hypertension    under control  . Hypothyroidism   . Sleep apnea    hx of, surgery to reverse   Past Surgical History:  Procedure Laterality Date  . CARDIAC CATHETERIZATION Right 2008   5 heart stents  . CATARACT EXTRACTION Bilateral 6 years ago  . CYSTOSCOPY N/A 08/06/2012   Procedure: CYSTOSCOPY;  Surgeon: [name], MD;  Location: [facility];  Service: Urology;  Laterality: N/A;  . ESOPHAGOGASTRIC FUNDOPLICATION  1610   spleen removed, 5 pints of blood given  . HERNIA REPAIR  1985   x4  . INGUINAL HERNIA REPAIR  1992  . PACEMAKER IMPLANT N/A 10/30/2018   Procedure: PACEMAKER IMPLANT;  Surgeon: Constance Haw, MD;  Location: [facility];  Service: Cardiovascular;  Laterality: N/A;  . PACEMAKER INSERTION    . PPM GENERATOR REMOVAL N/A 10/25/2018   Procedure: PPM GENERATOR REMOVAL;  Surgeon: Constance Haw, MD;  Location: [facility];  Service: Cardiovascular;  Laterality: N/A;  . PROSTATE ABLATION  2013   "laser ablation"  . TEE WITHOUT CARDIOVERSION N/A 10/25/2018   Procedure: TRANSESOPHAGEAL ECHOCARDIOGRAM (TEE);  Surgeon: Acie Fredrickson Wonda Cheng, MD;  Location: [facility];  Service: Cardiovascular;  Laterality: N/A;  . TEMPORARY PACEMAKER N/A 10/25/2018   Procedure: TEMPORARY PACEMAKER;  Surgeon: Constance Haw, MD;  Location: [facility];  Service: Cardiovascular;  Laterality: N/A;  . TONSILLECTOMY  as child  . TOTAL KNEE ARTHROPLASTY Right 08/03/2014   Procedure: TOTAL RIGHT KNEE ARTHROPLASTY;  Surgeon: [name], MD;  Location: [facility];  Service: Orthopedics;  Laterality: Right;  . TRANSURETHRAL RESECTION OF PROSTATE N/A 08/06/2012   Procedure: Almyra Free OF PROSTATE WITH GYRUS ;  Surgeon: [name], MD;  Location: [facility];  Service:  Urology;  Laterality: N/A;  . UMBILICAL HERNIA REPAIR  1990  . UVULECTOMY  2005   for sleep apnea  . UVULOPALATOPHARYNGOPLASTY  6 years ago    FAMILY HISTORY Family History  Problem Relation Age of Onset  . Heart attack Father 45       lived to 93  . Alzheimer's disease Mother   . Stroke Mother   . Breast cancer Mother   . Alzheimer's disease Maternal Grandmother   . Stroke Maternal Grandmother   . Lung cancer Maternal Grandmother   . Lung cancer Maternal Grandfather   . Cancer Paternal Grandmother        type unknown  . Diabetes Paternal Grandfather   . Other Paternal Grandfather        Growth in the back of  the head    SOCIAL HISTORY Social History   Tobacco Use  . Smoking status: Never Smoker  . Smokeless tobacco: Never Used  Substance Use Topics  . Alcohol use: Yes    Alcohol/week: 4.0 standard drinks    Types: 4 Standard drinks or equivalent per week    Comment: 3 drinks at night   . Drug use: No         OPHTHALMIC EXAM:  Base Eye Exam    Visual Acuity (Snellen - Linear)      Right Left   Dist [place] 20/50 -2 20/20       Tonometry (Tonopen, 9:32 AM)      Right Left   Pressure 13 14       Visual Fields (Counting fingers)      Left Right    Full    Restrictions  Total inferior nasal deficiency; Partial outer superior nasal deficiency       Extraocular Movement      Right Left    Full, Ortho Full, Ortho       Neuro/Psych    Oriented x3: Yes   Mood/Affect: Normal       Dilation    Both eyes: 1.0% Mydriacyl, 2.5% Phenylephrine @ 8:06 AM       Dilation #2    Both eyes: 1.0% Mydriacyl, 2.5% Phenylephrine @ 10:06 AM        Slit Lamp and Fundus Exam    Slit Lamp Exam      Right Left   Lids/Lashes Dermatochalasis - upper lid, Telangiectasia, Meibomian gland dysfunction Dermatochalasis - upper lid, Telangiectasia, Meibomian gland dysfunction   Conjunctiva/Sclera White and quiet White and quiet   Cornea Arcus, Well healed cataract wounds,  Punctate epithelial erosions Arcus, Well healed cataract wounds, Punctate epithelial erosions   Anterior Chamber Deep and quiet Deep and quiet   Iris Round and dilated Round and dilated   Lens 3 piece PC IOL in good position 3 piece PC IOL in good position, open PC   Vitreous Vitreous syneresis, +pigment, Posterior vitreous detachment Vitreous syneresis       Fundus Exam      Right Left   Disc trace Pallor, mild tilt, mild temporal PPA trace Pallor, mild tilt, mild temporal PPA, compact   C/D Ratio 0.3 0.4   Macula +SRF ST macula just crossing over fovea Flat, Blunted foveal reflex, Retinal pigment epithelial mottling, No heme or edema   Vessels Vascular attenuation, Tortuous Vascular attenuation, Tortuous   Periphery bullous RD with corregations from 0800-1230, HST at 01100,  Attached, focal pigmented CR scar at 0200, No RT/RD on 360 scleral depression         Refraction    Manifest Refraction      Sphere Cylinder Axis Dist VA   Right [place] +0.50 030 20/50-2   Left -0.25 Sphere  20/20          IMAGING AND PROCEDURES  Imaging and Procedures for _0 @  OCT, Retina - OU - Both Eyes       Right Eye Quality was good. Central Foveal Thickness: 630. Progression has no prior data. Findings include abnormal foveal contour, intraretinal fluid, subretinal fluid, myopic contour.   Left Eye Quality was good. Central Foveal Thickness: 256. Progression has no prior data. Findings include normal foveal contour, no IRF, no SRF (Partial PVD, mild ERM).   Notes *Images captured and stored on drive  Diagnosis / Impression:  OD: fovea-involving superotemporal retinal detachment -- +IRF, +  SRF OS: NFP; no IRF/SRF  Clinical management:  See below  Abbreviations: NFP - Normal foveal profile. CME - cystoid macular edema. PED - pigment epithelial detachment. IRF - intraretinal fluid. SRF - subretinal fluid. EZ - ellipsoid zone. ERM - epiretinal membrane. ORA - outer retinal atrophy. ORT -  outer retinal tubulation. SRHM - subretinal hyper-reflective material        Color Fundus Photography Optos - OU - Both Eyes       Right Eye Progression has no prior data. Disc findings include pallor. Macula : detached. Vessels : attenuated, tortuous vessels. Periphery : detachment, tear, RPE abnormality.   Left Eye Progression has no prior data. Disc findings include pallor. Macula : retinal pigment epithelium abnormalities, normal observations. Vessels : attenuated, tortuous vessels. Periphery : RPE abnormality.   Notes **Images stored on drive**  Impression: OD: superotemporal retinal detachment with tear at 11 oclock; shallow SRF just extending subfovea OS: normal; scattered RPE changes peripherally        Pneumatic Retinopexy - OD - Right Eye       PROCEDURE NOTE  Diagnosis:  Retinal detachment with retinal tear, RIGHT EYE  Procedure:  Pneumatic cryopexy with C3F8 gas injection, RIGHT EYE  CPT:  55208  Surgeon: Glass Glass, M.D.,Ph.D.  Anesthesia:  Subconjunctival Lidocaine   The patient was brought to the procedure room. Informed consent had already been obtained for cryopexy of tear and intravitreal gas injection. The diagnosis was reviewed with the patient and all questions were answered. The risks of the procedure including potential systemic risks like stroke and heart attack and other thrombotic events as well as the local risks like infection, endophthalmitis, retinal detachment were discussed. The patient consented to the procedure.   The RIGHT eye was marked and a time out was performed identifying the correct eye. Subsequently, the patient was placed in the supine position. Topical anesthetic drops were given and a lid speculum was placed to expose the eye. Subsequently, 2% Lidocaine was injected subconjunctivally in the quadrants of the tears giving adequate anesthesia. Using indirect ophthalmoscopy the cryo probe was positioned beneath the tear at 1100  and choroidal and retinal whitening was achieved 360 degrees around the tear margins. The superotemporal quadrant was then cleaned with Betadine swabs and allowed to dry. At this time, 3.5 mm posterior to the limbus utilizing a 30-gauge needle, 0.45 cc of pure, 100% C3F8 gas was injected into the vitreous cavity under direct visualization. The needle was then withdrawn from the eye and the retina and gas bubble were visualized. An AC tap was performed to normalize the pressure and the central retinal artery was noted to be patent. Betadine was then reapplied to the injection sites and then rinsed with sterile BSS. A drop of polymixin ophthalmic soln and Tobrex opthalmic ung were applied to the eye, then the eye was double patched with eye pads. An arrow was drawn on the dressing to assist with post-operative positioning. There were no complications. Discharge and post-operative instructions were reviewed.                 ASSESSMENT/PLAN:    ICD-10-CM   1. 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 Retinal edema  H35.81 OCT, Retina - OU - Both Eyes  3. Essential hypertension  I10   4. Hypertensive retinopathy of both eyes  H35.033  5. Pseudophakia of both eyes  Z96.1     1,2. Rhegmatogenous retinal detachment, right eye  - bullous supeotemporal mac off detachment, onset of foveal involvement Thursday, 01/24/19, by pt history  - detached from 0800-1230 with large HST at 01100 -- posterior SRF just crossed over fovea  - The incidence, risk factors, and natural history of retinal detachment was discussed with patient.  Potential treatment options including delimiting laser, pneumatic retinopexy, scleral buckle, and vitrectomy, cryotherapy and laser, and the use of air, gas, and oil discussed with patient.  The risks of blindness, loss of vision, infection, hemorrhage, cataract  progression or lens displacement were discussed with patient.  - recommend pneumatic retinopexy OD today, 08.21.20  - pt wishes to proceed with procedure  - RBA of procedure discussed, questions answered  - informed consent obtained and signed  - see procedure note  - reviewed post op positioning and care instructions  - remove patch tomorrow morning and start maxitrol ung QID OD tomorrow  - f/u January 27, 2019 -- POV -- DFE/OCT  3,4. Hypertensive retinopathy OU  - discussed importance of tight BP control  - monitor  5. Pseudophakia OU  - s/p CE/IOL OU (K. Hecker)  - IOLs in excellent position  - monitor   Ophthalmic Meds Ordered this visit:  Meds ordered this encounter  Medications  . neomycin-polymyxin-dexameth (MAXITROL) 0.1 % OINT    Sig: Place 1 application into the right eye 4 (four) times daily.    Dispense:  3.5 g    Refill:  3       Return in about 3 days (around 01/27/2019) for RD OD, POV.  There are no Patient Instructions on file for this visit.   Explained the diagnoses, plan, and follow up with the patient and they expressed understanding.  Patient expressed understanding of the importance of proper follow up care.   This document serves as a record of services personally performed by Gardiner Sleeper, MD, PhD. It was created on their behalf by Ernest Mallick, OA, an ophthalmic assistant. The creation of this record is the provider's dictation and/or activities during the visit.    Electronically signed by: Ernest Mallick, OA  08.21.2020 4:09 PM    Gardiner Sleeper, M.D., Ph.D. Diseases & Surgery of the Retina and Vitreous [facility]  I have reviewed the above documentation for accuracy and completeness, and I agree with the above. Gardiner Sleeper, M.D., Ph.D. 01/24/19 4:09 PM   Abbreviations: M myopia (nearsighted); A astigmatism; H hyperopia (farsighted); P presbyopia; Mrx spectacle prescription;  CTL contact lenses; OD right eye; OS  left eye; OU both eyes  XT exotropia; ET esotropia; PEK punctate epithelial keratitis; PEE punctate epithelial erosions; DES dry eye syndrome; MGD meibomian gland dysfunction; ATs artificial tears; PFAT's preservative free artificial tears; [place] nuclear sclerotic cataract; PSC posterior subcapsular cataract; ERM epi-retinal membrane; PVD posterior vitreous detachment; RD retinal detachment; DM diabetes mellitus; DR diabetic retinopathy; NPDR non-proliferative diabetic retinopathy; PDR proliferative diabetic retinopathy; CSME clinically significant macular edema; DME diabetic macular edema; dbh dot blot hemorrhages; CWS cotton wool spot; POAG primary open angle glaucoma; C/D cup-to-disc ratio; HVF humphrey visual field; GVF goldmann visual field; OCT optical coherence tomography; IOP intraocular pressure; BRVO Branch retinal vein occlusion; CRVO central retinal vein occlusion; CRAO central retinal artery occlusion; BRAO branch retinal artery occlusion; RT retinal tear; SB scleral buckle; PPV pars plana vitrectomy; VH Vitreous hemorrhage; PRP panretinal laser photocoagulation; IVK intravitreal kenalog; VMT vitreomacular  traction; MH Macular hole;  NVD neovascularization of the disc; NVE neovascularization elsewhere; AREDS age related eye disease study; ARMD age related macular degeneration; POAG primary open angle glaucoma; EBMD epithelial/anterior basement membrane dystrophy; ACIOL anterior chamber intraocular lens; IOL intraocular lens; PCIOL posterior chamber intraocular lens; Phaco/IOL phacoemulsification with intraocular lens placement; [place] photorefractive keratectomy; LASIK laser assisted in situ keratomileusis; HTN hypertension; DM diabetes mellitus; COPD chronic obstructive pulmonary disease

## 2019-01-27 ENCOUNTER — Encounter (INDEPENDENT_AMBULATORY_CARE_PROVIDER_SITE_OTHER): Payer: Self-pay | Admitting: Ophthalmology

## 2019-01-27 ENCOUNTER — Other Ambulatory Visit: Payer: Self-pay

## 2019-01-27 ENCOUNTER — Ambulatory Visit (INDEPENDENT_AMBULATORY_CARE_PROVIDER_SITE_OTHER): Payer: Medicare Other | Admitting: Ophthalmology

## 2019-01-27 DIAGNOSIS — H35033 Hypertensive retinopathy, bilateral: Secondary | ICD-10-CM

## 2019-01-27 DIAGNOSIS — Z961 Presence of intraocular lens: Secondary | ICD-10-CM

## 2019-01-27 DIAGNOSIS — H33011 Retinal detachment with single break, right eye: Secondary | ICD-10-CM | POA: Diagnosis not present

## 2019-01-27 DIAGNOSIS — H3581 Retinal edema: Secondary | ICD-10-CM

## 2019-01-27 DIAGNOSIS — I10 Essential (primary) hypertension: Secondary | ICD-10-CM

## 2019-01-27 NOTE — Progress Notes (Signed)
[facility] Note  01/27/2019     CHIEF COMPLAINT Patient presents for Post-op Follow-up   HISTORY OF PRESENT ILLNESS: [name] Glass is a 78 y.o. male who presents to the clinic today for:   HPI    Post-op Follow-up    In right eye.  Discomfort includes pain.  Vision is improved.  I, the attending physician,  performed the HPI with the patient and updated documentation appropriately.          Comments    Patient states vision is improving OD and curtain has gone completely away.  Patient denies current eye pain but had some mild eye pain after pneumatic.  Patient denies any new or worsening floaters or fol OU.       Last edited by Bernarda Caffey, MD on 01/27/2019 10:30 PM. (History)    pt states his vision is much better today, he states the curtain is gone from his vision, pt states his gas bubble has a small satellite bubble attached to it  Referring physician: [name], MD [address],  [place] [id number]  HISTORICAL INFORMATION:   Selected notes from the MEDICAL RECORD NUMBER    CURRENT MEDICATIONS: Current Outpatient Medications (Ophthalmic Drugs)  Medication Sig  . neomycin-polymyxin-dexameth (MAXITROL) 0.1 % OINT Place 1 application into the right eye 4 (four) times daily.   No current facility-administered medications for this visit.  (Ophthalmic Drugs)   Current Outpatient Medications (Other)  Medication Sig  . acidophilus (RISAQUAD) CAPS capsule Take 1 capsule by mouth 2 (two) times daily.  [name] acyclovir (ZOVIRAX) 200 MG capsule TAKE 1 CAPSULE (200 MG TOTAL) BY MOUTH 3 (THREE) TIMES DAILY.  [name] anastrozole (ARIMIDEX) 1 MG tablet Take 1 mg by mouth 2 (two) times a week.   [name] aspirin EC 81 MG tablet Take 1 tablet (81 mg total) by mouth daily.  . clopidogrel (PLAVIX) 75 MG tablet Take 1 tablet (75 mg total) by mouth daily.  [name] DEXILANT 60 MG capsule Take 1 capsule by mouth daily.  [name] ezetimibe (ZETIA) 10 MG tablet  Take 1 tablet (10 mg total) by mouth daily.  . fluticasone (FLONASE) 50 MCG/ACT nasal spray Place 2 sprays into both nostrils daily.  . hydrALAZINE (APRESOLINE) 25 MG tablet TAKE 1 TABLET BY MOUTH 3 TIMES A DAY  . liothyronine (CYTOMEL) 25 MCG tablet Take 25 mcg by mouth every morning.   . Nutritional Supplements (DHEA PO) Take 25 mg by mouth.  . Pitavastatin Calcium (LIVALO) 4 MG TABS Take 1 tablet (4 mg total) by mouth daily.  . tamsulosin (FLOMAX) 0.4 MG CAPS capsule Take 0.4 mg by mouth daily after breakfast.   . thyroid (ARMOUR) 60 MG tablet Take 60-120 mg by mouth 2 (two) times daily.    No current facility-administered medications for this visit.  (Other)      REVIEW OF SYSTEMS: ROS    Positive for: Musculoskeletal, Endocrine, Cardiovascular, Eyes, Respiratory   Negative for: Constitutional, Gastrointestinal, Neurological, Skin, Genitourinary, HENT, Psychiatric, Allergic/Imm, Heme/Lymph   Last edited by Doneen Poisson on 01/27/2019  3:06 PM. (History)       ALLERGIES Allergies  Allergen Reactions  . Morphine And Related     Had "crazy dreams" felt "crazy" per pt.  . Bactrim [Sulfamethoxazole-Trimethoprim]   . Statins     PAST MEDICAL HISTORY Past Medical History:  Diagnosis Date  . Arthritis   . Coronary artery disease   .  GERD (gastroesophageal reflux disease)   . H/O bronchitis   . H/O hiatal hernia   . History of kidney stones last in 1976  . Hyperlipidemia    statin intolerant, under control  . Hypertension    under control  . Hypothyroidism   . Sleep apnea    hx of, surgery to reverse   Past Surgical History:  Procedure Laterality Date  . CARDIAC CATHETERIZATION Right 2008   5 heart stents  . CATARACT EXTRACTION Bilateral 6 years ago  . CYSTOSCOPY N/A 08/06/2012   Procedure: CYSTOSCOPY;  Surgeon: [name], MD;  Location: [facility];  Service: Urology;  Laterality: N/A;  . ESOPHAGOGASTRIC FUNDOPLICATION  2841   spleen removed, 5 pints of blood given   . HERNIA REPAIR  1985   x4  . INGUINAL HERNIA REPAIR  1992  . PACEMAKER IMPLANT N/A 10/30/2018   Procedure: PACEMAKER IMPLANT;  Surgeon: Constance Haw, MD;  Location: [facility];  Service: Cardiovascular;  Laterality: N/A;  . PACEMAKER INSERTION    . PPM GENERATOR REMOVAL N/A 10/25/2018   Procedure: PPM GENERATOR REMOVAL;  Surgeon: Constance Haw, MD;  Location: [facility];  Service: Cardiovascular;  Laterality: N/A;  . PROSTATE ABLATION  2013   "laser ablation"  . TEE WITHOUT CARDIOVERSION N/A 10/25/2018   Procedure: TRANSESOPHAGEAL ECHOCARDIOGRAM (TEE);  Surgeon: Acie Fredrickson Wonda Cheng, MD;  Location: [facility];  Service: Cardiovascular;  Laterality: N/A;  . TEMPORARY PACEMAKER N/A 10/25/2018   Procedure: TEMPORARY PACEMAKER;  Surgeon: Constance Haw, MD;  Location: [facility];  Service: Cardiovascular;  Laterality: N/A;  . TONSILLECTOMY  as child  . TOTAL KNEE ARTHROPLASTY Right 08/03/2014   Procedure: TOTAL RIGHT KNEE ARTHROPLASTY;  Surgeon: [name], MD;  Location: [facility];  Service: Orthopedics;  Laterality: Right;  . TRANSURETHRAL RESECTION OF PROSTATE N/A 08/06/2012   Procedure: [place] Free OF PROSTATE WITH GYRUS ;  Surgeon: [name], MD;  Location: [facility];  Service: Urology;  Laterality: N/A;  . UMBILICAL HERNIA REPAIR  1990  . UVULECTOMY  2005   for sleep apnea  . UVULOPALATOPHARYNGOPLASTY  6 years ago    FAMILY HISTORY Family History  Problem Relation Age of Onset  . Heart attack Father 50       lived to 69  . Alzheimer's disease Mother   . Stroke Mother   . Breast cancer Mother   . Alzheimer's disease Maternal Grandmother   . Stroke Maternal Grandmother   . Lung cancer Maternal Grandmother   . Lung cancer Maternal Grandfather   . Cancer Paternal Grandmother        type unknown  . Diabetes Paternal Grandfather   . Other Paternal Grandfather        Growth in the back of the head    SOCIAL HISTORY Social History    Tobacco Use  . Smoking status: Never Smoker  . Smokeless tobacco: Never Used  Substance Use Topics  . Alcohol use: Yes    Alcohol/week: 4.0 standard drinks    Types: 4 Standard drinks or equivalent per week    Comment: 3 drinks at night   . Drug use: No         OPHTHALMIC EXAM:  Base Eye Exam    Visual Acuity (Snellen - Linear)      Right Left   Dist [place] 20/150 -1 20/20 -2   Dist ph [place] 20/80 +2        Tonometry (Tonopen,  3:09 PM)      Right Left   Pressure 13 11       Pupils      Dark Light Shape React APD   Right 2 1 Round Minimal 0   Left 2 1 Round Minimal 0       Visual Fields      Left Right    Full Full       Extraocular Movement      Right Left    Full Full       Neuro/Psych    Oriented x3: Yes   Mood/Affect: Normal       Dilation    Right eye: 1.0% Mydriacyl, 2.5% Phenylephrine @ 3:09 PM        Slit Lamp and Fundus Exam    Slit Lamp Exam      Right Left   Lids/Lashes Dermatochalasis - upper lid, Telangiectasia, Meibomian gland dysfunction Dermatochalasis - upper lid, Telangiectasia, Meibomian gland dysfunction   Conjunctiva/Sclera Subconjunctival hemorrhage White and quiet   Cornea Arcus, Well healed cataract wounds, 2+ inferior Punctate epithelial erosions Arcus, Well healed cataract wounds, Punctate epithelial erosions   Anterior Chamber Deep and quiet Deep and quiet   Iris Round and dilated Round and dilated   Lens 3 piece PC IOL in good position 3 piece PC IOL in good position, open PC   Vitreous Vitreous syneresis, +pigment, Posterior vitreous detachment, gas bubble 30-35%, sattelite bubble temporally Vitreous syneresis       Fundus Exam      Right Left   Disc trace Pallor, mild tilt, mild temporal PPA    C/D Ratio 0.3 0.4   Macula Re-attached, trace residual SRF centrally    Vessels Vascular attenuation, Tortuous    Periphery Retina reattached under gas bubble; good early cryo changes surrounding 1100 oclock tearORIGINALLY:  bullous RD with corregations from 0800-1230, HST at 1100           IMAGING AND PROCEDURES  Imaging and Procedures for _0 @  OCT, Retina - OU - Both Eyes       Right Eye Quality was good. Central Foveal Thickness: 303. Progression has improved. Findings include abnormal foveal contour, subretinal fluid, myopic contour, no IRF (Retina re-attached with mild residual sub-foveal SRF).   Left Eye Quality was good. Central Foveal Thickness: 255. Progression has been stable. Findings include normal foveal contour, no IRF, no SRF (Partial PVD, mild ERM).   Notes *Images captured and stored on drive  Diagnosis / Impression:  OD: Retina re-attached with mild residual sub-foveal SRF OS: NFP; no IRF/SRF  Clinical management:  See below  Abbreviations: NFP - Normal foveal profile. CME - cystoid macular edema. PED - pigment epithelial detachment. IRF - intraretinal fluid. SRF - subretinal fluid. EZ - ellipsoid zone. ERM - epiretinal membrane. ORA - outer retinal atrophy. ORT - outer retinal tubulation. SRHM - subretinal hyper-reflective material                 ASSESSMENT/PLAN:    ICD-10-CM   1. 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 Retinal edema  H35.81 OCT, Retina - OU - Both Eyes  3. Essential hypertension  I10   4. Hypertensive retinopathy of both eyes  H35.033   5. Pseudophakia of both eyes  Z96.1     1,2. Rhegmatogenous retinal detachment, right eye  - bullous supeotemporal mac off detachment, onset of foveal involvement Thursday, 01/24/19, by pt history  - detached from [id number]  with large HST at 01100 -- posterior SRF just crossed over fovea  - s/p pneumatic cryopexy OD (08.21.20)  - retina reattached with tr residual subfoveal SRF  - gas bubble 30-35% w/ small satellite bubble temporally   - continue face down, head tilt position 30 mins/hr  - reviewed post op positioning   - continue maxitrol ung QID OD   - f/u Friday, January 31, 2019 -- POV -- DFE/OCT  3,4. Hypertensive retinopathy OU  - discussed importance of tight BP control  - monitor  5. Pseudophakia OU  - s/p CE/IOL OU (K. Hecker)  - IOLs in excellent position  - monitor   Ophthalmic Meds Ordered this visit:  No orders of the defined types were placed in this encounter.      Return in about 4 days (around 01/31/2019) for f/u RD OD, DFE, OCT, OPTOS.  There are no Patient Instructions on file for this visit.   Explained the diagnoses, plan, and follow up with the patient and they expressed understanding.  Patient expressed understanding of the importance of proper follow up care.   This document serves as a record of services personally performed by Gardiner Sleeper, MD, PhD. It was created on their behalf by Ernest Mallick, OA, an ophthalmic assistant. The creation of this record is the provider's dictation and/or activities during the visit.    Electronically signed by: Ernest Mallick, OA  08.24.2020 10:42 PM    Gardiner Sleeper, M.D., Ph.D. Diseases & Surgery of the Retina and Vitreous [facility]  I have reviewed the above documentation for accuracy and completeness, and I agree with the above. Gardiner Sleeper, M.D., Ph.D. 01/27/19 10:42 PM   Abbreviations: M myopia (nearsighted); A astigmatism; H hyperopia (farsighted); P presbyopia; Mrx spectacle prescription;  CTL contact lenses; OD right eye; OS left eye; OU both eyes  XT exotropia; ET esotropia; PEK punctate epithelial keratitis; PEE punctate epithelial erosions; DES dry eye syndrome; MGD meibomian gland dysfunction; ATs artificial tears; PFAT's preservative free artificial tears; [place] nuclear sclerotic cataract; PSC posterior subcapsular cataract; ERM epi-retinal membrane; PVD posterior vitreous detachment; RD retinal detachment; DM diabetes mellitus; DR diabetic retinopathy; NPDR non-proliferative diabetic retinopathy; PDR proliferative diabetic retinopathy; CSME clinically  significant macular edema; DME diabetic macular edema; dbh dot blot hemorrhages; CWS cotton wool spot; POAG primary open angle glaucoma; C/D cup-to-disc ratio; HVF humphrey visual field; GVF goldmann visual field; OCT optical coherence tomography; IOP intraocular pressure; BRVO Branch retinal vein occlusion; CRVO central retinal vein occlusion; CRAO central retinal artery occlusion; BRAO branch retinal artery occlusion; RT retinal tear; SB scleral buckle; PPV pars plana vitrectomy; VH Vitreous hemorrhage; PRP panretinal laser photocoagulation; IVK intravitreal kenalog; VMT vitreomacular traction; MH Macular hole;  NVD neovascularization of the disc; NVE neovascularization elsewhere; AREDS age related eye disease study; ARMD age related macular degeneration; POAG primary open angle glaucoma; EBMD epithelial/anterior basement membrane dystrophy; ACIOL anterior chamber intraocular lens; IOL intraocular lens; PCIOL posterior chamber intraocular lens; Phaco/IOL phacoemulsification with intraocular lens placement; [place] photorefractive keratectomy; LASIK laser assisted in situ keratomileusis; HTN hypertension; DM diabetes mellitus; COPD chronic obstructive pulmonary disease

## 2019-01-28 ENCOUNTER — Telehealth: Payer: Self-pay

## 2019-01-28 ENCOUNTER — Other Ambulatory Visit: Payer: Self-pay | Admitting: Internal Medicine

## 2019-01-28 NOTE — Telephone Encounter (Signed)
Patient called back, he did not want a telehealth visit. Patient rescheduled to 02/11/19 for an office visit.

## 2019-01-28 NOTE — Telephone Encounter (Signed)
I called and left patient a message about upcoming office visit on 01/30/19 at 1:45 with Dr. Caryl Comes. Thursday afternoons are for telehealth visits. Is patient ok with doing a telehealth visit, if so I need consent? Or does he want to come into the office?

## 2019-01-30 ENCOUNTER — Encounter: Payer: Medicare Other | Admitting: Internal Medicine

## 2019-01-30 ENCOUNTER — Ambulatory Visit (INDEPENDENT_AMBULATORY_CARE_PROVIDER_SITE_OTHER): Payer: Medicare Other | Admitting: *Deleted

## 2019-01-30 DIAGNOSIS — I495 Sick sinus syndrome: Secondary | ICD-10-CM

## 2019-01-31 ENCOUNTER — Encounter (INDEPENDENT_AMBULATORY_CARE_PROVIDER_SITE_OTHER): Payer: Self-pay | Admitting: Ophthalmology

## 2019-01-31 ENCOUNTER — Other Ambulatory Visit: Payer: Self-pay

## 2019-01-31 ENCOUNTER — Ambulatory Visit (INDEPENDENT_AMBULATORY_CARE_PROVIDER_SITE_OTHER): Payer: Medicare Other | Admitting: Ophthalmology

## 2019-01-31 DIAGNOSIS — Z961 Presence of intraocular lens: Secondary | ICD-10-CM | POA: Diagnosis not present

## 2019-01-31 DIAGNOSIS — I10 Essential (primary) hypertension: Secondary | ICD-10-CM

## 2019-01-31 DIAGNOSIS — H33011 Retinal detachment with single break, right eye: Secondary | ICD-10-CM | POA: Diagnosis not present

## 2019-01-31 DIAGNOSIS — H35033 Hypertensive retinopathy, bilateral: Secondary | ICD-10-CM | POA: Diagnosis not present

## 2019-01-31 DIAGNOSIS — H3581 Retinal edema: Secondary | ICD-10-CM

## 2019-01-31 NOTE — Progress Notes (Signed)
[facility] Note  01/31/2019     CHIEF COMPLAINT Patient presents for Post-op Follow-up   HISTORY OF PRESENT ILLNESS: [name] Glass is a 78 y.o. male who presents to the clinic today for:   HPI    Post-op Follow-up    In right eye.  Discomfort includes floaters.  Negative for pain, itching, foreign body sensation, tearing, discharge and none.  Vision is improved.  I, the attending physician,  performed the HPI with the patient and updated documentation appropriately.          Comments    Patient states vision improving some OD. No eye pain. Some floaters OD, no worse than before. Doing face down several times a day for 30 minutes per hour when able. Denies flashes. Using maxitrol ung qid OD.        Last edited by Bernarda Caffey, MD on 01/31/2019  4:05 PM. (History)    pt states his vision is improved since last visit, he states he is maintaining face down for 30 mins about 4-5times a day  Referring physician: [name], MD [address],  [place] [address]  HISTORICAL INFORMATION:   Selected notes from the MEDICAL RECORD NUMBER    CURRENT MEDICATIONS: Current Outpatient Medications (Ophthalmic Drugs)  Medication Sig  . neomycin-polymyxin-dexameth (MAXITROL) 0.1 % OINT Place 1 application into the right eye 4 (four) times daily.   No current facility-administered medications for this visit.  (Ophthalmic Drugs)   Current Outpatient Medications (Other)  Medication Sig  . acidophilus (RISAQUAD) CAPS capsule Take 1 capsule by mouth 2 (two) times daily.  [name] acyclovir (ZOVIRAX) 200 MG capsule TAKE 1 CAPSULE (200 MG TOTAL) BY MOUTH 3 (THREE) TIMES DAILY.  [name] anastrozole (ARIMIDEX) 1 MG tablet Take 1 mg by mouth 2 (two) times a week.   [name] aspirin EC 81 MG tablet Take 1 tablet (81 mg total) by mouth daily.  . clopidogrel (PLAVIX) 75 MG tablet TAKE 1 TABLET BY MOUTH EVERY DAY  . DEXILANT 60 MG capsule Take 1 capsule by mouth  daily.  [name] ezetimibe (ZETIA) 10 MG tablet Take 1 tablet (10 mg total) by mouth daily.  . fluticasone (FLONASE) 50 MCG/ACT nasal spray Place 2 sprays into both nostrils daily.  . hydrALAZINE (APRESOLINE) 25 MG tablet TAKE 1 TABLET BY MOUTH 3 TIMES A DAY  . liothyronine (CYTOMEL) 25 MCG tablet Take 25 mcg by mouth every morning.   . Nutritional Supplements (DHEA PO) Take 25 mg by mouth.  . Pitavastatin Calcium (LIVALO) 4 MG TABS Take 1 tablet (4 mg total) by mouth daily.  . tamsulosin (FLOMAX) 0.4 MG CAPS capsule Take 0.4 mg by mouth daily after breakfast.   . thyroid (ARMOUR) 60 MG tablet Take 60-120 mg by mouth 2 (two) times daily.    No current facility-administered medications for this visit.  (Other)      REVIEW OF SYSTEMS: ROS    Positive for: Musculoskeletal, Endocrine, Cardiovascular, Eyes, Respiratory   Negative for: Constitutional, Gastrointestinal, Neurological, Skin, Genitourinary, HENT, Psychiatric, Allergic/Imm, Heme/Lymph   Last edited by Roselee Nova D, COT on 01/31/2019  1:18 PM. (History)       ALLERGIES Allergies  Allergen Reactions  . Morphine And Related     Had "crazy dreams" felt "crazy" per pt.  . Bactrim [Sulfamethoxazole-Trimethoprim]   . Statins     PAST MEDICAL HISTORY Past Medical History:  Diagnosis Date  . Arthritis   .  Coronary artery disease   . GERD (gastroesophageal reflux disease)   . H/O bronchitis   . H/O hiatal hernia   . History of kidney stones last in 1976  . Hyperlipidemia    statin intolerant, under control  . Hypertension    under control  . Hypothyroidism   . Sleep apnea    hx of, surgery to reverse   Past Surgical History:  Procedure Laterality Date  . CARDIAC CATHETERIZATION Right 2008   5 heart stents  . CATARACT EXTRACTION Bilateral 6 years ago  . CYSTOSCOPY N/A 08/06/2012   Procedure: CYSTOSCOPY;  Surgeon: [name], MD;  Location: [facility];  Service: Urology;  Laterality: N/A;  . ESOPHAGOGASTRIC FUNDOPLICATION   2947   spleen removed, 5 pints of blood given  . HERNIA REPAIR  1985   x4  . INGUINAL HERNIA REPAIR  1992  . PACEMAKER IMPLANT N/A 10/30/2018   Procedure: PACEMAKER IMPLANT;  Surgeon: Constance Haw, MD;  Location: [facility];  Service: Cardiovascular;  Laterality: N/A;  . PACEMAKER INSERTION    . PPM GENERATOR REMOVAL N/A 10/25/2018   Procedure: PPM GENERATOR REMOVAL;  Surgeon: Constance Haw, MD;  Location: [facility];  Service: Cardiovascular;  Laterality: N/A;  . PROSTATE ABLATION  2013   "laser ablation"  . TEE WITHOUT CARDIOVERSION N/A 10/25/2018   Procedure: TRANSESOPHAGEAL ECHOCARDIOGRAM (TEE);  Surgeon: Acie Fredrickson Wonda Cheng, MD;  Location: [facility];  Service: Cardiovascular;  Laterality: N/A;  . TEMPORARY PACEMAKER N/A 10/25/2018   Procedure: TEMPORARY PACEMAKER;  Surgeon: Constance Haw, MD;  Location: [facility];  Service: Cardiovascular;  Laterality: N/A;  . TONSILLECTOMY  as child  . TOTAL KNEE ARTHROPLASTY Right 08/03/2014   Procedure: TOTAL RIGHT KNEE ARTHROPLASTY;  Surgeon: [name], MD;  Location: [facility];  Service: Orthopedics;  Laterality: Right;  . TRANSURETHRAL RESECTION OF PROSTATE N/A 08/06/2012   Procedure: [place] Free OF PROSTATE WITH GYRUS ;  Surgeon: [name], MD;  Location: [facility];  Service: Urology;  Laterality: N/A;  . UMBILICAL HERNIA REPAIR  1990  . UVULECTOMY  2005   for sleep apnea  . UVULOPALATOPHARYNGOPLASTY  6 years ago    FAMILY HISTORY Family History  Problem Relation Age of Onset  . Heart attack Father 45       lived to 13  . Alzheimer's disease Mother   . Stroke Mother   . Breast cancer Mother   . Alzheimer's disease Maternal Grandmother   . Stroke Maternal Grandmother   . Lung cancer Maternal Grandmother   . Lung cancer Maternal Grandfather   . Cancer Paternal Grandmother        type unknown  . Diabetes Paternal Grandfather   . Other Paternal Grandfather        Growth in the back of the  head    SOCIAL HISTORY Social History   Tobacco Use  . Smoking status: Never Smoker  . Smokeless tobacco: Never Used  Substance Use Topics  . Alcohol use: Yes    Alcohol/week: 4.0 standard drinks    Types: 4 Standard drinks or equivalent per week    Comment: 3 drinks at night   . Drug use: No         OPHTHALMIC EXAM:  Base Eye Exam    Visual Acuity (Snellen - Linear)      Right Left   Dist [place] 20/50 +2 20/20   Dist ph [place] 20/40 -1  Tonometry (Tonopen, 1:29 PM)      Right Left   Pressure 16 16       Pupils      Dark Light Shape React APD   Right 3 2 Round Brisk None   Left 3 2 Round Brisk None       Visual Fields (Counting fingers)      Left Right    Full Full       Extraocular Movement      Right Left    Full, Ortho Full, Ortho       Neuro/Psych    Oriented x3: Yes   Mood/Affect: Normal       Dilation    Right eye: 1.0% Mydriacyl, 2.5% Phenylephrine @ 1:29 PM        Slit Lamp and Fundus Exam    Slit Lamp Exam      Right Left   Lids/Lashes Dermatochalasis - upper lid, Telangiectasia, Meibomian gland dysfunction Dermatochalasis - upper lid, Telangiectasia, Meibomian gland dysfunction   Conjunctiva/Sclera Subconjunctival hemorrhage White and quiet   Cornea Arcus, Well healed cataract wounds, 2+ inferior Punctate epithelial erosions Arcus, Well healed cataract wounds, Punctate epithelial erosions   Anterior Chamber Deep and quiet Deep and quiet   Iris Round and dilated Round and dilated   Lens 3 piece PC IOL in good position 3 piece PC IOL in good position, open PC   Vitreous Vitreous syneresis, +pigment, Posterior vitreous detachment, gas bubble 30-35%, sattelite bubble temporally Vitreous syneresis       Fundus Exam      Right Left   Disc trace Pallor, mild tilt, mild temporal PPA    C/D Ratio 0.3 0.4   Macula Re-attached, trace residual SRF centrally and along demarcation line    Vessels Vascular attenuation, Tortuous    Periphery  Retina reattached under gas bubble; good early cryo changes surrounding 1100 oclock tear; shallow SRF ST quadrant, ORIGINALLY: bullous RD with corregations from 0800-1230, HST at 1100           IMAGING AND PROCEDURES  Imaging and Procedures for _0 @  OCT, Retina - OU - Both Eyes       Right Eye Quality was good. Central Foveal Thickness: 279. Progression has improved. Findings include subretinal fluid, myopic contour, no IRF, normal foveal contour (Shallow residual SRF centrally and along demarcation; interval improvement in vitreous opacities).   Left Eye Quality was good. Central Foveal Thickness: 255. Progression has been stable. Findings include normal foveal contour, no IRF, no SRF (Partial PVD, mild ERM).   Notes *Images captured and stored on drive  Diagnosis / Impression:  OD: Shallow residual SRF centrally and along demarcation; interval improvement in vitreous opacities OS: NFP; no IRF/SRF  Clinical management:  See below  Abbreviations: NFP - Normal foveal profile. CME - cystoid macular edema. PED - pigment epithelial detachment. IRF - intraretinal fluid. SRF - subretinal fluid. EZ - ellipsoid zone. ERM - epiretinal membrane. ORA - outer retinal atrophy. ORT - outer retinal tubulation. SRHM - subretinal hyper-reflective material                 ASSESSMENT/PLAN:    ICD-10-CM   1. 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 Retinal edema  H35.81 OCT, Retina - OU - Both Eyes  3. Essential hypertension  I10   4. Hypertensive retinopathy of both eyes  H35.033   5. Pseudophakia of both eyes  Z96.1     1,2. Rhegmatogenous  retinal detachment, right eye  - bullous supeotemporal mac off detachment, onset of foveal involvement Thursday, 01/24/19, by pt history  - detached from 0800-1230 with large HST at 01100 -- posterior SRF just crossed over fovea  - s/p pneumatic cryopexy OD (08.21.20)  - retina reattached with tr residual subfoveal  SRF  - gas bubble 30-35% w/ small satellite bubble temporally  - pt reports decreased compliance with positioning  - shallow SRF noted ST periphery  - discussed importance of compliance with positioning   - continue face down, head tilt position 30 mins/hr and face down 15 mins/hr  - decrease maxitrol ung BID OD   - f/u Tuesday, February 04, 2019 -- POV -- DFE/OCT  3,4. Hypertensive retinopathy OU  - discussed importance of tight BP control  - monitor  5. Pseudophakia OU  - s/p CE/IOL OU (K. Hecker)  - IOLs in excellent position  - monitor   Ophthalmic Meds Ordered this visit:  No orders of the defined types were placed in this encounter.      Return in about 4 days (around 02/04/2019) for AM appt, POV RD OD.  There are no Patient Instructions on file for this visit.   Explained the diagnoses, plan, and follow up with the patient and they expressed understanding.  Patient expressed understanding of the importance of proper follow up care.   This document serves as a record of services personally performed by Gardiner Sleeper, MD, PhD. It was created on their behalf by Ernest Mallick, OA, an ophthalmic assistant. The creation of this record is the provider's dictation and/or activities during the visit.    Electronically signed by: Ernest Mallick, OA  08.28.2020 12:41 PM   Gardiner Sleeper, M.D., Ph.D. Diseases & Surgery of the Retina and Vitreous [facility]  I have reviewed the above documentation for accuracy and completeness, and I agree with the above. Gardiner Sleeper, M.D., Ph.D. 02/02/19 12:48 PM    Abbreviations: M myopia (nearsighted); A astigmatism; H hyperopia (farsighted); P presbyopia; Mrx spectacle prescription;  CTL contact lenses; OD right eye; OS left eye; OU both eyes  XT exotropia; ET esotropia; PEK punctate epithelial keratitis; PEE punctate epithelial erosions; DES dry eye syndrome; MGD meibomian gland dysfunction; ATs artificial tears;  PFAT's preservative free artificial tears; [place] nuclear sclerotic cataract; PSC posterior subcapsular cataract; ERM epi-retinal membrane; PVD posterior vitreous detachment; RD retinal detachment; DM diabetes mellitus; DR diabetic retinopathy; NPDR non-proliferative diabetic retinopathy; PDR proliferative diabetic retinopathy; CSME clinically significant macular edema; DME diabetic macular edema; dbh dot blot hemorrhages; CWS cotton wool spot; POAG primary open angle glaucoma; C/D cup-to-disc ratio; HVF humphrey visual field; GVF goldmann visual field; OCT optical coherence tomography; IOP intraocular pressure; BRVO Branch retinal vein occlusion; CRVO central retinal vein occlusion; CRAO central retinal artery occlusion; BRAO branch retinal artery occlusion; RT retinal tear; SB scleral buckle; PPV pars plana vitrectomy; VH Vitreous hemorrhage; PRP panretinal laser photocoagulation; IVK intravitreal kenalog; VMT vitreomacular traction; MH Macular hole;  NVD neovascularization of the disc; NVE neovascularization elsewhere; AREDS age related eye disease study; ARMD age related macular degeneration; POAG primary open angle glaucoma; EBMD epithelial/anterior basement membrane dystrophy; ACIOL anterior chamber intraocular lens; IOL intraocular lens; PCIOL posterior chamber intraocular lens; Phaco/IOL phacoemulsification with intraocular lens placement; [place] photorefractive keratectomy; LASIK laser assisted in situ keratomileusis; HTN hypertension; DM diabetes mellitus; COPD chronic obstructive pulmonary disease

## 2019-02-01 LAB — CUP PACEART REMOTE DEVICE CHECK
Battery Remaining Longevity: 162 mo
Battery Voltage: 3.2 V
Brady Statistic AP VP Percent: 0.12 %
Brady Statistic AP VS Percent: 29.47 %
Brady Statistic AS VP Percent: 0.07 %
Brady Statistic AS VS Percent: 70.33 %
Brady Statistic RA Percent Paced: 29.5 %
Brady Statistic RV Percent Paced: 0.2 %
Date Time Interrogation Session: 20200829023808
Implantable Lead Implant Date: 20200527
Implantable Lead Implant Date: 20200527
Implantable Lead Location: 753859
Implantable Lead Location: 753860
Implantable Lead Model: 5076
Implantable Lead Model: 5076
Implantable Pulse Generator Implant Date: 20200527
Lead Channel Impedance Value: 361 Ohm
Lead Channel Impedance Value: 475 Ohm
Lead Channel Impedance Value: 551 Ohm
Lead Channel Impedance Value: 798 Ohm
Lead Channel Pacing Threshold Amplitude: 0.625 V
Lead Channel Pacing Threshold Amplitude: 0.75 V
Lead Channel Pacing Threshold Pulse Width: 0.4 ms
Lead Channel Pacing Threshold Pulse Width: 0.4 ms
Lead Channel Sensing Intrinsic Amplitude: 4.625 mV
Lead Channel Sensing Intrinsic Amplitude: 4.625 mV
Lead Channel Sensing Intrinsic Amplitude: 8.375 mV
Lead Channel Sensing Intrinsic Amplitude: 8.375 mV
Lead Channel Setting Pacing Amplitude: 3 V
Lead Channel Setting Pacing Amplitude: 3 V
Lead Channel Setting Pacing Pulse Width: 0.4 ms
Lead Channel Setting Sensing Sensitivity: 2 mV

## 2019-02-02 ENCOUNTER — Encounter (INDEPENDENT_AMBULATORY_CARE_PROVIDER_SITE_OTHER): Payer: Self-pay | Admitting: Ophthalmology

## 2019-02-03 NOTE — Progress Notes (Signed)
[facility] Note  02/04/2019     CHIEF COMPLAINT Patient presents for Retina Follow Up   HISTORY OF PRESENT ILLNESS: [name] Glass is a 78 y.o. male who presents to the clinic today for:   HPI    Retina Follow Up    Patient presents with  Retinal Break/Detachment.  In right eye.  Severity is moderate.  Duration of 4 days.  I, the attending physician,  performed the HPI with the patient and updated documentation appropriately.          Comments    Patient states vision slowly improving OD. Still has floaters OD, but no worse than before. Cut back to bid on maxitrol ointment OD per Dr. Coralyn Pear.        Last edited by Bernarda Caffey, MD on 02/04/2019  8:20 AM. (History)    pt states he maintained the face down positioning almost all day throughout the weekend   Referring physician: [name], MD [address],  [place] [address]  HISTORICAL INFORMATION:   Selected notes from the MEDICAL RECORD NUMBER    CURRENT MEDICATIONS: Current Outpatient Medications (Ophthalmic Drugs)  Medication Sig  . neomycin-polymyxin-dexameth (MAXITROL) 0.1 % OINT Place 1 application into the right eye 4 (four) times daily. (Patient taking differently: Place 1 application into the right eye 2 (two) times daily. )   No current facility-administered medications for this visit.  (Ophthalmic Drugs)   Current Outpatient Medications (Other)  Medication Sig  . acidophilus (RISAQUAD) CAPS capsule Take 1 capsule by mouth 2 (two) times daily.  [name] acyclovir (ZOVIRAX) 200 MG capsule TAKE 1 CAPSULE (200 MG TOTAL) BY MOUTH 3 (THREE) TIMES DAILY.  [name] anastrozole (ARIMIDEX) 1 MG tablet Take 1 mg by mouth 2 (two) times a week.   [name] aspirin EC 81 MG tablet Take 1 tablet (81 mg total) by mouth daily.  . clopidogrel (PLAVIX) 75 MG tablet TAKE 1 TABLET BY MOUTH EVERY DAY  . DEXILANT 60 MG capsule Take 1 capsule by mouth daily.  [name] ezetimibe (ZETIA) 10 MG tablet Take 1  tablet (10 mg total) by mouth daily.  . fluticasone (FLONASE) 50 MCG/ACT nasal spray Place 2 sprays into both nostrils daily.  . hydrALAZINE (APRESOLINE) 25 MG tablet TAKE 1 TABLET BY MOUTH 3 TIMES A DAY  . liothyronine (CYTOMEL) 25 MCG tablet Take 25 mcg by mouth every morning.   . Nutritional Supplements (DHEA PO) Take 25 mg by mouth.  . Pitavastatin Calcium (LIVALO) 4 MG TABS Take 1 tablet (4 mg total) by mouth daily.  . tamsulosin (FLOMAX) 0.4 MG CAPS capsule Take 0.4 mg by mouth daily after breakfast.   . thyroid (ARMOUR) 60 MG tablet Take 60-120 mg by mouth 2 (two) times daily.    No current facility-administered medications for this visit.  (Other)      REVIEW OF SYSTEMS: ROS    Positive for: Musculoskeletal, Endocrine, Cardiovascular, Eyes, Respiratory   Negative for: Constitutional, Gastrointestinal, Neurological, Skin, Genitourinary, HENT, Psychiatric, Allergic/Imm, Heme/Lymph   Last edited by Roselee Nova D, COT on 02/04/2019  8:02 AM. (History)       ALLERGIES Allergies  Allergen Reactions  . Morphine And Related     Had "crazy dreams" felt "crazy" per pt.  . Bactrim [Sulfamethoxazole-Trimethoprim]   . Statins     PAST MEDICAL HISTORY Past Medical History:  Diagnosis Date  . Arthritis   . Coronary artery disease   .  GERD (gastroesophageal reflux disease)   . H/O bronchitis   . H/O hiatal hernia   . History of kidney stones last in 1976  . Hyperlipidemia    statin intolerant, under control  . Hypertension    under control  . Hypothyroidism   . Sleep apnea    hx of, surgery to reverse   Past Surgical History:  Procedure Laterality Date  . CARDIAC CATHETERIZATION Right 2008   5 heart stents  . CATARACT EXTRACTION Bilateral 6 years ago  . CYSTOSCOPY N/A 08/06/2012   Procedure: CYSTOSCOPY;  Surgeon: [name], MD;  Location: [facility];  Service: Urology;  Laterality: N/A;  . ESOPHAGOGASTRIC FUNDOPLICATION  2122   spleen removed, 5 pints of blood given  .  HERNIA REPAIR  1985   x4  . INGUINAL HERNIA REPAIR  1992  . PACEMAKER IMPLANT N/A 10/30/2018   Procedure: PACEMAKER IMPLANT;  Surgeon: Constance Haw, MD;  Location: [facility];  Service: Cardiovascular;  Laterality: N/A;  . PACEMAKER INSERTION    . PPM GENERATOR REMOVAL N/A 10/25/2018   Procedure: PPM GENERATOR REMOVAL;  Surgeon: Constance Haw, MD;  Location: [facility];  Service: Cardiovascular;  Laterality: N/A;  . PROSTATE ABLATION  2013   "laser ablation"  . TEE WITHOUT CARDIOVERSION N/A 10/25/2018   Procedure: TRANSESOPHAGEAL ECHOCARDIOGRAM (TEE);  Surgeon: Acie Fredrickson Wonda Cheng, MD;  Location: [facility];  Service: Cardiovascular;  Laterality: N/A;  . TEMPORARY PACEMAKER N/A 10/25/2018   Procedure: TEMPORARY PACEMAKER;  Surgeon: Constance Haw, MD;  Location: [facility];  Service: Cardiovascular;  Laterality: N/A;  . TONSILLECTOMY  as child  . TOTAL KNEE ARTHROPLASTY Right 08/03/2014   Procedure: TOTAL RIGHT KNEE ARTHROPLASTY;  Surgeon: [name], MD;  Location: [facility];  Service: Orthopedics;  Laterality: Right;  . TRANSURETHRAL RESECTION OF PROSTATE N/A 08/06/2012   Procedure: Almyra Free OF PROSTATE WITH GYRUS ;  Surgeon: [name], MD;  Location: [facility];  Service: Urology;  Laterality: N/A;  . UMBILICAL HERNIA REPAIR  1990  . UVULECTOMY  2005   for sleep apnea  . UVULOPALATOPHARYNGOPLASTY  6 years ago    FAMILY HISTORY Family History  Problem Relation Age of Onset  . Heart attack Father 19       lived to 41  . Alzheimer's disease Mother   . Stroke Mother   . Breast cancer Mother   . Alzheimer's disease Maternal Grandmother   . Stroke Maternal Grandmother   . Lung cancer Maternal Grandmother   . Lung cancer Maternal Grandfather   . Cancer Paternal Grandmother        type unknown  . Diabetes Paternal Grandfather   . Other Paternal Grandfather        Growth in the back of the head    SOCIAL HISTORY Social History    Tobacco Use  . Smoking status: Never Smoker  . Smokeless tobacco: Never Used  Substance Use Topics  . Alcohol use: Yes    Alcohol/week: 4.0 standard drinks    Types: 4 Standard drinks or equivalent per week    Comment: 3 drinks at night   . Drug use: No         OPHTHALMIC EXAM:  Base Eye Exam    Visual Acuity (Snellen - Linear)      Right Left   Dist [place] 20/60 -1 20/20   Dist ph [place] 20/40 -2        Tonometry (Tonopen, 8:11  AM)      Right Left   Pressure 12 13       Pupils      Dark Light Shape React APD   Right 3 2 Round Brisk None   Left 3 2 Round Brisk None       Visual Fields (Counting fingers)      Left Right    Full Full       Extraocular Movement      Right Left    Full, Ortho Full, Ortho       Neuro/Psych    Oriented x3: Yes   Mood/Affect: Normal       Dilation    Both eyes: 1.0% Mydriacyl, 2.5% Phenylephrine @ 8:11 AM        Slit Lamp and Fundus Exam    Slit Lamp Exam      Right Left   Lids/Lashes Dermatochalasis - upper lid, Telangiectasia, Meibomian gland dysfunction Dermatochalasis - upper lid, Telangiectasia, Meibomian gland dysfunction   Conjunctiva/Sclera Subconjunctival hemorrhage White and quiet   Cornea Arcus, Well healed cataract wounds, 2+ inferior Punctate epithelial erosions Arcus, Well healed cataract wounds, Punctate epithelial erosions   Anterior Chamber Deep and quiet Deep and quiet   Iris Round and dilated Round and dilated   Lens 3 piece PC IOL in good position 3 piece PC IOL in good position, open PC   Vitreous Vitreous syneresis, +pigment, Posterior vitreous detachment, gas bubble 25-30%, satellite bubble centrally, +vitreous condensations Vitreous syneresis       Fundus Exam      Right Left   Disc trace Pallor, mild tilt, mild temporal PPA trace Pallor, mild tilt, mild temporal PPA, compact   C/D Ratio 0.3 0.4   Macula Re-attached, flat, focal residual SRF centrally and along demarcation line Flat, Blunted foveal  reflex, Retinal pigment epithelial mottling, No heme or edema   Vessels Vascular attenuation, Tortuous Vascular attenuation, Tortuous   Periphery New retinal break at 1000--no SRF; Superior temporal retina reattached under gas bubble; good early cryo changes surrounding 1100 oclock tear; shallow SRF ST quadrant improvedORIGINALLY: bullous RD with corregations from 0800-1230, HST at 1100 Attached, focal pigmented CR scar at 0200, No RT/RD on 360 scleral depression           IMAGING AND PROCEDURES  Imaging and Procedures for _0 @  OCT, Retina - OU - Both Eyes       Right Eye Quality was good. Central Foveal Thickness: 278. Progression has improved. Findings include subretinal fluid, myopic contour, no IRF, normal foveal contour (Mild interval increase in subfoveal SRF; interval improvement in vitreous opacities).   Left Eye Quality was good. Central Foveal Thickness: 253. Progression has been stable. Findings include normal foveal contour, no IRF, no SRF (Partial PVD, mild ERM).   Notes *Images captured and stored on drive  Diagnosis / Impression:  OD: Persistent subfoveal SRF; interval improvement in vitreous opacities OS: NFP; no IRF/SRF  Clinical management:  See below  Abbreviations: NFP - Normal foveal profile. CME - cystoid macular edema. PED - pigment epithelial detachment. IRF - intraretinal fluid. SRF - subretinal fluid. EZ - ellipsoid zone. ERM - epiretinal membrane. ORA - outer retinal atrophy. ORT - outer retinal tubulation. SRHM - subretinal hyper-reflective material        Repair Retinal Breaks, Cryotherapy - OD - Right Eye       CRYOPEXY PROCEDURE NOTE  Preoperative Diagnosis:       Retinal break, RIGHT EYE Postoperative Diagnosis:  Same  Surgeon:              Bernarda Caffey, M.D./Ph.D. Assistant:   Zenovia Jordan, LPN Anesthesia Type:            Subconjunctival lidocaine injection   The patient's RIGHT eye was marked and a timeout was performed to  verify the correct patient, the correct site, and correct procedure. One of drop of Proparacaine was given in the operative eye. A local subconjunctival block was performed utilizing 2% Lidocaine with Epinepherine using an 30 gauge needle from 8-1 clock hours. A wire eyelid speculum was inserted into the operative eye. Using indirect ophthalmoloscopy, the retinal tears/breaks were identified. Cryotherapy was appropriately utilized in the areas of retinal tears/breaks at 10 oclock.  A small amount of Tobradex ointment was instilled to the operative eye and the eye was patched.  The patient was given a prescription for Tobradex gtts to be used QID in the operative eye until the next visit.                 ASSESSMENT/PLAN:    ICD-10-CM   1. 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 Retinal edema  H35.81 OCT, Retina - OU - Both Eyes  3. Essential hypertension  I10   4. Hypertensive retinopathy of both eyes  H35.033   5. Pseudophakia of both eyes  Z96.1   6. Retinal hole of right eye  H33.321 Repair Retinal Breaks, Cryotherapy - OD - Right Eye    1,2. Rhegmatogenous retinal detachment, right eye  - bullous supeotemporal mac off detachment, onset of foveal involvement Thursday, 01/24/19, by pt history  - detached from 0800-1230 with large HST at 01100 -- posterior SRF just crossed over fovea  - s/p pneumatic cryopexy OD (08.21.20)  - new retinal hole at 1000 noted on exam today -- recommend cryopexy again (09.01.20)  - retina reattached with tr residual subfoveal SRF  - gas bubble 25-30% w/ small satellite bubble  - pt reports improved compliance with positioning  - shallow SRF previously noted ST periphery -- improved today  - discussed importance of compliance with positioning   - continue face down, left head tilt position  - continue maxitrol ung QID OD   - f/u Thursday, February 06, 2019 -- POV -- DFE/OCT  3,4. Hypertensive retinopathy OU  - discussed  importance of tight BP control  - monitor  5. Pseudophakia OU  - s/p CE/IOL OU (K. Hecker)  - IOLs in excellent position  - monitor  6. New retinal break at 1000 OD - The incidence, risk factors, and natural history of retinal tear was discussed with patient.   - Potential treatment options including laser retinopexy and cryotherapy discussed with patient. - retinal break at 10 oclock -- almost to ora -- no SRF - recommend cryoretinopexy OD - RBA of procedure discussed, questions answered - informed consent obtained and signed - see procedure note - continue maxitrol ung QID OD     Ophthalmic Meds Ordered this visit:  No orders of the defined types were placed in this encounter.      Return in about 2 days (around 02/06/2019) for f/u s/p pneumatic cryopexy OD, DFE, OCT, OPTOS.  There are no Patient Instructions on file for this visit.   Explained the diagnoses, plan, and follow up with the patient and they expressed understanding.  Patient expressed understanding of the importance of proper follow up care.   This document serves as  a record of services personally performed by Gardiner Sleeper, MD, PhD. It was created on their behalf by Ernest Mallick, OA, an ophthalmic assistant. The creation of this record is the provider's dictation and/or activities during the visit.    Electronically signed by: Ernest Mallick, OA  08.31.2020 11:25 PM    Gardiner Sleeper, M.D., Ph.D. Diseases & Surgery of the Retina and Vitreous [facility]  I have reviewed the above documentation for accuracy and completeness, and I agree with the above. Gardiner Sleeper, M.D., Ph.D. 02/04/19 11:25 PM     Abbreviations: M myopia (nearsighted); A astigmatism; H hyperopia (farsighted); P presbyopia; Mrx spectacle prescription;  CTL contact lenses; OD right eye; OS left eye; OU both eyes  XT exotropia; ET esotropia; PEK punctate epithelial keratitis; PEE punctate epithelial erosions;  DES dry eye syndrome; MGD meibomian gland dysfunction; ATs artificial tears; PFAT's preservative free artificial tears; [place] nuclear sclerotic cataract; PSC posterior subcapsular cataract; ERM epi-retinal membrane; PVD posterior vitreous detachment; RD retinal detachment; DM diabetes mellitus; DR diabetic retinopathy; NPDR non-proliferative diabetic retinopathy; PDR proliferative diabetic retinopathy; CSME clinically significant macular edema; DME diabetic macular edema; dbh dot blot hemorrhages; CWS cotton wool spot; POAG primary open angle glaucoma; C/D cup-to-disc ratio; HVF humphrey visual field; GVF goldmann visual field; OCT optical coherence tomography; IOP intraocular pressure; BRVO Branch retinal vein occlusion; CRVO central retinal vein occlusion; CRAO central retinal artery occlusion; BRAO branch retinal artery occlusion; RT retinal tear; SB scleral buckle; PPV pars plana vitrectomy; VH Vitreous hemorrhage; PRP panretinal laser photocoagulation; IVK intravitreal kenalog; VMT vitreomacular traction; MH Macular hole;  NVD neovascularization of the disc; NVE neovascularization elsewhere; AREDS age related eye disease study; ARMD age related macular degeneration; POAG primary open angle glaucoma; EBMD epithelial/anterior basement membrane dystrophy; ACIOL anterior chamber intraocular lens; IOL intraocular lens; PCIOL posterior chamber intraocular lens; Phaco/IOL phacoemulsification with intraocular lens placement; [place] photorefractive keratectomy; LASIK laser assisted in situ keratomileusis; HTN hypertension; DM diabetes mellitus; COPD chronic obstructive pulmonary disease

## 2019-02-04 ENCOUNTER — Encounter (INDEPENDENT_AMBULATORY_CARE_PROVIDER_SITE_OTHER): Payer: Self-pay | Admitting: Ophthalmology

## 2019-02-04 ENCOUNTER — Other Ambulatory Visit: Payer: Self-pay

## 2019-02-04 ENCOUNTER — Ambulatory Visit (INDEPENDENT_AMBULATORY_CARE_PROVIDER_SITE_OTHER): Payer: Medicare Other | Admitting: Ophthalmology

## 2019-02-04 DIAGNOSIS — Z961 Presence of intraocular lens: Secondary | ICD-10-CM | POA: Diagnosis not present

## 2019-02-04 DIAGNOSIS — H35033 Hypertensive retinopathy, bilateral: Secondary | ICD-10-CM

## 2019-02-04 DIAGNOSIS — H33321 Round hole, right eye: Secondary | ICD-10-CM | POA: Diagnosis not present

## 2019-02-04 DIAGNOSIS — H33011 Retinal detachment with single break, right eye: Secondary | ICD-10-CM | POA: Diagnosis not present

## 2019-02-04 DIAGNOSIS — I10 Essential (primary) hypertension: Secondary | ICD-10-CM

## 2019-02-04 DIAGNOSIS — H3581 Retinal edema: Secondary | ICD-10-CM

## 2019-02-05 NOTE — Progress Notes (Signed)
[facility] Note  02/06/2019     CHIEF COMPLAINT Patient presents for Post-op Follow-up   HISTORY OF PRESENT ILLNESS: [name] Glass is a 78 y.o. male who presents to the clinic today for:   HPI    Post-op Follow-up    In right eye.  Discomfort includes floaters.  Vision is improved.  I, the attending physician,  performed the HPI with the patient and updated documentation appropriately.          Comments    78 y/o male pt here for 2 day f/u.  S/p pneumatic cryopexy OD on 01/24/19 and 02/04/19.  No noticed change in [place] OU.  Denies pain, flashes, but reports a few small floaters OU.  Maxitrol ung BID OD.       Last edited by Bernarda Caffey, MD on 02/06/2019  9:38 AM. (History)    pt states his eye was a little scratchy after the last cryopexy, but he was not in any pain, pt states he is maintaining face down about 45 mins/hr  Referring physician: [name], MD [address],  [place] [address]  HISTORICAL INFORMATION:   Selected notes from the MEDICAL RECORD NUMBER    CURRENT MEDICATIONS: Current Outpatient Medications (Ophthalmic Drugs)  Medication Sig  . neomycin-polymyxin-dexameth (MAXITROL) 0.1 % OINT Place 1 application into the right eye 4 (four) times daily. (Patient taking differently: Place 1 application into the right eye 2 (two) times daily. )   No current facility-administered medications for this visit.  (Ophthalmic Drugs)   Current Outpatient Medications (Other)  Medication Sig  . acidophilus (RISAQUAD) CAPS capsule Take 1 capsule by mouth 2 (two) times daily.  [name] acyclovir (ZOVIRAX) 200 MG capsule TAKE 1 CAPSULE (200 MG TOTAL) BY MOUTH 3 (THREE) TIMES DAILY.  [name] anastrozole (ARIMIDEX) 1 MG tablet Take 1 mg by mouth 2 (two) times a week.   [name] aspirin EC 81 MG tablet Take 1 tablet (81 mg total) by mouth daily.  . clopidogrel (PLAVIX) 75 MG tablet TAKE 1 TABLET BY MOUTH EVERY DAY  . DEXILANT 60 MG capsule Take 1  capsule by mouth daily.  [name] ezetimibe (ZETIA) 10 MG tablet Take 1 tablet (10 mg total) by mouth daily.  . fluticasone (FLONASE) 50 MCG/ACT nasal spray Place 2 sprays into both nostrils daily.  . hydrALAZINE (APRESOLINE) 25 MG tablet TAKE 1 TABLET BY MOUTH 3 TIMES A DAY  . liothyronine (CYTOMEL) 25 MCG tablet Take 25 mcg by mouth every morning.   . Nutritional Supplements (DHEA PO) Take 25 mg by mouth.  . Pitavastatin Calcium (LIVALO) 4 MG TABS Take 1 tablet (4 mg total) by mouth daily.  . tamsulosin (FLOMAX) 0.4 MG CAPS capsule Take 0.4 mg by mouth daily after breakfast.   . thyroid (ARMOUR) 60 MG tablet Take 60-120 mg by mouth 2 (two) times daily.    No current facility-administered medications for this visit.  (Other)      REVIEW OF SYSTEMS: ROS    Positive for: Genitourinary, Musculoskeletal, Cardiovascular, Eyes, Respiratory   Negative for: Constitutional, Gastrointestinal, Neurological, Skin, HENT, Endocrine, Psychiatric, Allergic/Imm, Heme/Lymph   Last edited by Matthew Folks, COA on 02/06/2019  9:17 AM. (History)       ALLERGIES Allergies  Allergen Reactions  . Morphine And Related     Had "crazy dreams" felt "crazy" per pt.  . Bactrim [Sulfamethoxazole-Trimethoprim]   . Statins     PAST MEDICAL HISTORY  Past Medical History:  Diagnosis Date  . Arthritis   . Coronary artery disease   . GERD (gastroesophageal reflux disease)   . H/O bronchitis   . H/O hiatal hernia   . History of kidney stones last in 1976  . Hyperlipidemia    statin intolerant, under control  . Hypertension    under control  . Hypertensive retinopathy    OU  . Hypothyroidism   . Sleep apnea    hx of, surgery to reverse   Past Surgical History:  Procedure Laterality Date  . CARDIAC CATHETERIZATION Right 2008   5 heart stents  . CATARACT EXTRACTION Bilateral 6 years ago  . CYSTOSCOPY N/A 08/06/2012   Procedure: CYSTOSCOPY;  Surgeon: [name], MD;  Location: [facility];  Service: Urology;   Laterality: N/A;  . ESOPHAGOGASTRIC FUNDOPLICATION  3382   spleen removed, 5 pints of blood given  . EYE SURGERY    . HERNIA REPAIR  1985   x4  . INGUINAL HERNIA REPAIR  1992  . PACEMAKER IMPLANT N/A 10/30/2018   Procedure: PACEMAKER IMPLANT;  Surgeon: Constance Haw, MD;  Location: [facility];  Service: Cardiovascular;  Laterality: N/A;  . PACEMAKER INSERTION    . PPM GENERATOR REMOVAL N/A 10/25/2018   Procedure: PPM GENERATOR REMOVAL;  Surgeon: Constance Haw, MD;  Location: [facility];  Service: Cardiovascular;  Laterality: N/A;  . PROSTATE ABLATION  2013   "laser ablation"  . RETINAL DETACHMENT SURGERY Right 01/24/2019   Pneumatic cryopexy - Dr. Bernarda Caffey  . TEE WITHOUT CARDIOVERSION N/A 10/25/2018   Procedure: TRANSESOPHAGEAL ECHOCARDIOGRAM (TEE);  Surgeon: Acie Fredrickson Wonda Cheng, MD;  Location: [facility];  Service: Cardiovascular;  Laterality: N/A;  . TEMPORARY PACEMAKER N/A 10/25/2018   Procedure: TEMPORARY PACEMAKER;  Surgeon: Constance Haw, MD;  Location: [facility];  Service: Cardiovascular;  Laterality: N/A;  . TONSILLECTOMY  as child  . TOTAL KNEE ARTHROPLASTY Right 08/03/2014   Procedure: TOTAL RIGHT KNEE ARTHROPLASTY;  Surgeon: [name], MD;  Location: [facility];  Service: Orthopedics;  Laterality: Right;  . TRANSURETHRAL RESECTION OF PROSTATE N/A 08/06/2012   Procedure: Almyra Free OF PROSTATE WITH GYRUS ;  Surgeon: [name], MD;  Location: [facility];  Service: Urology;  Laterality: N/A;  . UMBILICAL HERNIA REPAIR  1990  . UVULECTOMY  2005   for sleep apnea  . UVULOPALATOPHARYNGOPLASTY  6 years ago    FAMILY HISTORY Family History  Problem Relation Age of Onset  . Heart attack Father 42       lived to 5  . Alzheimer's disease Mother   . Stroke Mother   . Breast cancer Mother   . Alzheimer's disease Maternal Grandmother   . Stroke Maternal Grandmother   . Lung cancer Maternal Grandmother   . Lung cancer Maternal  Grandfather   . Cancer Paternal Grandmother        type unknown  . Diabetes Paternal Grandfather   . Other Paternal Grandfather        Growth in the back of the head    SOCIAL HISTORY Social History   Tobacco Use  . Smoking status: Never Smoker  . Smokeless tobacco: Never Used  Substance Use Topics  . Alcohol use: Yes    Alcohol/week: 4.0 standard drinks    Types: 4 Standard drinks or equivalent per week    Comment: 3 drinks at night   . Drug use: No  OPHTHALMIC EXAM:  Base Eye Exam    Visual Acuity (Snellen - Linear)      Right Left   Dist [place] 20/40 -2 20/20   Dist ph Vincent NI        Tonometry (Tonopen, 9:20 AM)      Right Left   Pressure 14 14       Pupils      Dark Light Shape React APD   Right 3 2 Round Brisk None   Left 3 2 Round Brisk None       Visual Fields (Counting fingers)      Left Right    Full Full       Extraocular Movement      Right Left    Full, Ortho Full, Ortho       Neuro/Psych    Oriented x3: Yes   Mood/Affect: Normal       Dilation    Right eye: 1.0% Mydriacyl, 2.5% Phenylephrine @ 9:20 AM        Slit Lamp and Fundus Exam    Slit Lamp Exam      Right Left   Lids/Lashes Dermatochalasis - upper lid, Telangiectasia, Meibomian gland dysfunction Dermatochalasis - upper lid, Telangiectasia, Meibomian gland dysfunction   Conjunctiva/Sclera Subconjunctival hemorrhage White and quiet   Cornea Arcus, Well healed cataract wounds, 2+ inferior Punctate epithelial erosions Arcus, Well healed cataract wounds, Punctate epithelial erosions   Anterior Chamber Deep and quiet Deep and quiet   Iris Round and dilated Round and dilated   Lens 3 piece PC IOL in good position 3 piece PC IOL in good position, open PC   Vitreous Vitreous syneresis, +pigment, Posterior vitreous detachment, gas bubble 25-30%, satellite bubble centrally, +pigmented vitreous condensations Vitreous syneresis       Fundus Exam      Right Left   Disc trace  Pallor, mild tilt, mild temporal PPA    C/D Ratio 0.3 0.4   Macula Re-attached, flat, focal residual SRF centrally and along demarcation line    Vessels Vascular attenuation, Tortuous    Periphery retinal break at 1000--good early cryo changes; Superior temporal retina reattached under gas bubble; good cryo changes surrounding 1100 oclock tear; shallow SRF ST quadrant improved; ORIGINALLY: bullous RD with corregations from 0800-1230, HST at 1100           IMAGING AND PROCEDURES  Imaging and Procedures for _0 @  OCT, Retina - OU - Both Eyes       Right Eye Quality was good. Central Foveal Thickness: 275. Progression has been stable. Findings include subretinal fluid, myopic contour, no IRF, normal foveal contour (Stable pocket of subfoveal SRF; mild interval improvement in vitreous opacities).   Left Eye Quality was good. Central Foveal Thickness: 250. Progression has been stable. Findings include normal foveal contour, no IRF, no SRF (Partial PVD, mild ERM).   Notes *Images captured and stored on drive  Diagnosis / Impression:  OD: Stable pocket of subfoveal SRF; mild interval improvement in vitreous opacities; shallow peripheral SRF improved OS: NFP; no IRF/SRF  Clinical management:  See below  Abbreviations: NFP - Normal foveal profile. CME - cystoid macular edema. PED - pigment epithelial detachment. IRF - intraretinal fluid. SRF - subretinal fluid. EZ - ellipsoid zone. ERM - epiretinal membrane. ORA - outer retinal atrophy. ORT - outer retinal tubulation. SRHM - subretinal hyper-reflective material                 ASSESSMENT/PLAN:    ICD-10-CM  1. 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 Retinal edema  H35.81 OCT, Retina - OU - Both Eyes  3. Essential hypertension  I10   4. Hypertensive retinopathy of both eyes  H35.033   5. Pseudophakia of both eyes  Z96.1   6. Retinal hole of right eye  H33.321     1,2. Rhegmatogenous retinal  detachment, right eye  - bullous supeotemporal mac off detachment, onset of foveal involvement Thursday, 01/24/19, by pt history  - detached from 0800-1230 with large HST at 01100 -- posterior SRF just crossed over fovea  - s/p pneumatic cryopexy OD (08.21.20)  - new retinal hole at 1000 noted on exam 02/04/19 -- s/p cryopexy  - retina reattached with tr residual subfoveal SRF  - gas bubble 25-30% w/ small satellite bubble  - pt reports improved compliance with positioning  - shallow SRF ST periphery -- improved  - discussed importance of compliance with positioning   - continue face down, left head tilt position  - continue maxitrol ung QID OD   - f/u Wednesday, February 12, 2019 -- POV -- DFE/OCT  3,4. Hypertensive retinopathy OU  - discussed importance of tight BP control  - monitor  5. Pseudophakia OU  - s/p CE/IOL OU (K. Hecker)  - IOLs in excellent position  - monitor  6. Retinal break at 1000 OD  - retinal break at 10 oclock -- almost to ora -- no SRF  - s/p cryoretinopexy OD (08.31.20) -- good early cryo changes present  - continue maxitrol ung QID OD     Ophthalmic Meds Ordered this visit:  No orders of the defined types were placed in this encounter.      Return in about 6 days (around 02/12/2019) for POV RD OD, DFE, OCT, OPTOS.  There are no Patient Instructions on file for this visit.   Explained the diagnoses, plan, and follow up with the patient and they expressed understanding.  Patient expressed understanding of the importance of proper follow up care.   This document serves as a record of services personally performed by Gardiner Sleeper, MD, PhD. It was created on their behalf by Ernest Mallick, OA, an ophthalmic assistant. The creation of this record is the provider's dictation and/or activities during the visit.    Electronically signed by: Ernest Mallick, OA  09.02.2020 2:26 PM     Gardiner Sleeper, M.D., Ph.D. Diseases & Surgery of the Retina and  Vitreous [facility]   I have reviewed the above documentation for accuracy and completeness, and I agree with the above. Gardiner Sleeper, M.D., Ph.D. 02/06/19 2:29 PM     Abbreviations: M myopia (nearsighted); A astigmatism; H hyperopia (farsighted); P presbyopia; Mrx spectacle prescription;  CTL contact lenses; OD right eye; OS left eye; OU both eyes  XT exotropia; ET esotropia; PEK punctate epithelial keratitis; PEE punctate epithelial erosions; DES dry eye syndrome; MGD meibomian gland dysfunction; ATs artificial tears; PFAT's preservative free artificial tears; [place] nuclear sclerotic cataract; PSC posterior subcapsular cataract; ERM epi-retinal membrane; PVD posterior vitreous detachment; RD retinal detachment; DM diabetes mellitus; DR diabetic retinopathy; NPDR non-proliferative diabetic retinopathy; PDR proliferative diabetic retinopathy; CSME clinically significant macular edema; DME diabetic macular edema; dbh dot blot hemorrhages; CWS cotton wool spot; POAG primary open angle glaucoma; C/D cup-to-disc ratio; HVF humphrey visual field; GVF goldmann visual field; OCT optical coherence tomography; IOP intraocular pressure; BRVO Branch retinal vein occlusion; CRVO central retinal vein  occlusion; CRAO central retinal artery occlusion; BRAO branch retinal artery occlusion; RT retinal tear; SB scleral buckle; PPV pars plana vitrectomy; VH Vitreous hemorrhage; PRP panretinal laser photocoagulation; IVK intravitreal kenalog; VMT vitreomacular traction; MH Macular hole;  NVD neovascularization of the disc; NVE neovascularization elsewhere; AREDS age related eye disease study; ARMD age related macular degeneration; POAG primary open angle glaucoma; EBMD epithelial/anterior basement membrane dystrophy; ACIOL anterior chamber intraocular lens; IOL intraocular lens; PCIOL posterior chamber intraocular lens; Phaco/IOL phacoemulsification with intraocular lens placement; [place] photorefractive  keratectomy; LASIK laser assisted in situ keratomileusis; HTN hypertension; DM diabetes mellitus; COPD chronic obstructive pulmonary disease

## 2019-02-06 ENCOUNTER — Other Ambulatory Visit: Payer: Self-pay

## 2019-02-06 ENCOUNTER — Encounter (INDEPENDENT_AMBULATORY_CARE_PROVIDER_SITE_OTHER): Payer: Self-pay | Admitting: Ophthalmology

## 2019-02-06 ENCOUNTER — Ambulatory Visit (INDEPENDENT_AMBULATORY_CARE_PROVIDER_SITE_OTHER): Payer: Medicare Other | Admitting: Ophthalmology

## 2019-02-06 DIAGNOSIS — H35033 Hypertensive retinopathy, bilateral: Secondary | ICD-10-CM

## 2019-02-06 DIAGNOSIS — H33321 Round hole, right eye: Secondary | ICD-10-CM

## 2019-02-06 DIAGNOSIS — Z961 Presence of intraocular lens: Secondary | ICD-10-CM

## 2019-02-06 DIAGNOSIS — H3581 Retinal edema: Secondary | ICD-10-CM | POA: Diagnosis not present

## 2019-02-06 DIAGNOSIS — H33011 Retinal detachment with single break, right eye: Secondary | ICD-10-CM | POA: Diagnosis not present

## 2019-02-06 DIAGNOSIS — I10 Essential (primary) hypertension: Secondary | ICD-10-CM | POA: Diagnosis not present

## 2019-02-06 NOTE — Progress Notes (Signed)
Remote pacemaker transmission.   

## 2019-02-06 NOTE — Addendum Note (Signed)
Addended by: Douglass Rivers D on: 02/06/2019 02:24 PM   Modules accepted: Level of Service

## 2019-02-11 ENCOUNTER — Encounter: Payer: Self-pay | Admitting: Internal Medicine

## 2019-02-11 ENCOUNTER — Ambulatory Visit (INDEPENDENT_AMBULATORY_CARE_PROVIDER_SITE_OTHER): Payer: Medicare Other | Admitting: Internal Medicine

## 2019-02-11 ENCOUNTER — Other Ambulatory Visit: Payer: Self-pay

## 2019-02-11 VITALS — BP 118/66 | HR 64 | Ht 72.0 in | Wt 213.4 lb

## 2019-02-11 DIAGNOSIS — I495 Sick sinus syndrome: Secondary | ICD-10-CM | POA: Diagnosis not present

## 2019-02-11 DIAGNOSIS — I2510 Atherosclerotic heart disease of native coronary artery without angina pectoris: Secondary | ICD-10-CM

## 2019-02-11 DIAGNOSIS — R55 Syncope and collapse: Secondary | ICD-10-CM

## 2019-02-11 DIAGNOSIS — Z950 Presence of cardiac pacemaker: Secondary | ICD-10-CM | POA: Diagnosis not present

## 2019-02-11 NOTE — Progress Notes (Signed)
[facility] Note  02/12/2019     CHIEF COMPLAINT Patient presents for Post-op Follow-up   HISTORY OF PRESENT ILLNESS: [name] Glass is a 78 y.o. male who presents to the clinic today for:   HPI    Post-op Follow-up    In right eye.  Vision is improved.  I, the attending physician,  performed the HPI with the patient and updated documentation appropriately.          Comments    Patient states his vision is improving.  Patient denies eye pain or discomfort.  Patient describes his vision as looking through piece of gauze OD.  Patient complains of lots of floaters OD; denies fol.       Last edited by Bernarda Caffey, MD on 02/12/2019  8:50 AM. (History)    pt states he is doing well, he states the gas bubble is much smaller, he is using the ointment twice a day, he states he still has a lot of floaters  Referring physician: [name], MD [address],  [place] [address]  HISTORICAL INFORMATION:   Selected notes from the MEDICAL RECORD NUMBER    CURRENT MEDICATIONS: Current Outpatient Medications (Ophthalmic Drugs)  Medication Sig  . neomycin-polymyxin-dexameth (MAXITROL) 0.1 % OINT Place 1 application into the right eye 2 (two) times daily.   No current facility-administered medications for this visit.  (Ophthalmic Drugs)   Current Outpatient Medications (Other)  Medication Sig  . acidophilus (RISAQUAD) CAPS capsule Take 1 capsule by mouth 2 (two) times daily.  [name] acyclovir (ZOVIRAX) 200 MG capsule TAKE 1 CAPSULE (200 MG TOTAL) BY MOUTH 3 (THREE) TIMES DAILY.  [name] anastrozole (ARIMIDEX) 1 MG tablet Take 1 mg by mouth 2 (two) times a week.   [name] aspirin EC 81 MG tablet Take 1 tablet (81 mg total) by mouth daily.  . clopidogrel (PLAVIX) 75 MG tablet TAKE 1 TABLET BY MOUTH EVERY DAY  . DEXILANT 60 MG capsule Take 1 capsule by mouth daily.  [name] ezetimibe (ZETIA) 10 MG tablet Take 1 tablet (10 mg total) by mouth daily.  .  fluticasone (FLONASE) 50 MCG/ACT nasal spray Place 2 sprays into both nostrils daily.  . hydrALAZINE (APRESOLINE) 25 MG tablet TAKE 1 TABLET BY MOUTH 3 TIMES A DAY  . liothyronine (CYTOMEL) 25 MCG tablet Take 25 mcg by mouth every morning.   . Nutritional Supplements (DHEA PO) Take 25 mg by mouth.  . Pitavastatin Calcium (LIVALO) 4 MG TABS Take 1 tablet (4 mg total) by mouth daily.  . tamsulosin (FLOMAX) 0.4 MG CAPS capsule Take 0.4 mg by mouth daily after breakfast.   . thyroid (ARMOUR) 60 MG tablet Take 60-120 mg by mouth 2 (two) times daily.    No current facility-administered medications for this visit.  (Other)      REVIEW OF SYSTEMS: ROS    Positive for: Genitourinary, Musculoskeletal, Cardiovascular, Eyes, Respiratory   Negative for: Constitutional, Gastrointestinal, Neurological, Skin, HENT, Endocrine, Psychiatric, Allergic/Imm, Heme/Lymph   Last edited by Doneen Poisson on 02/12/2019  8:31 AM. (History)       ALLERGIES Allergies  Allergen Reactions  . Morphine And Related     Had "crazy dreams" felt "crazy" per pt.  . Bactrim [Sulfamethoxazole-Trimethoprim]   . Statins     PAST MEDICAL HISTORY Past Medical History:  Diagnosis Date  . Arthritis   . Coronary artery disease   . GERD (gastroesophageal reflux disease)   .  H/O bronchitis   . H/O hiatal hernia   . History of kidney stones last in 1976  . Hyperlipidemia    statin intolerant, under control  . Hypertension    under control  . Hypertensive retinopathy    OU  . Hypothyroidism   . Sleep apnea    hx of, surgery to reverse   Past Surgical History:  Procedure Laterality Date  . CARDIAC CATHETERIZATION Right 2008   5 heart stents  . CATARACT EXTRACTION Bilateral 6 years ago  . CYSTOSCOPY N/A 08/06/2012   Procedure: CYSTOSCOPY;  Surgeon: [name], MD;  Location: [facility];  Service: Urology;  Laterality: N/A;  . ESOPHAGOGASTRIC FUNDOPLICATION  3825   spleen removed, 5 pints of blood given  . EYE  SURGERY    . HERNIA REPAIR  1985   x4  . INGUINAL HERNIA REPAIR  1992  . PACEMAKER IMPLANT N/A 10/30/2018   Procedure: PACEMAKER IMPLANT;  Surgeon: Constance Haw, MD;  Location: [facility];  Service: Cardiovascular;  Laterality: N/A;  . PACEMAKER INSERTION    . PPM GENERATOR REMOVAL N/A 10/25/2018   Procedure: PPM GENERATOR REMOVAL;  Surgeon: Constance Haw, MD;  Location: [facility];  Service: Cardiovascular;  Laterality: N/A;  . PROSTATE ABLATION  2013   "laser ablation"  . RETINAL DETACHMENT SURGERY Right 01/24/2019   Pneumatic cryopexy - Dr. Bernarda Caffey  . TEE WITHOUT CARDIOVERSION N/A 10/25/2018   Procedure: TRANSESOPHAGEAL ECHOCARDIOGRAM (TEE);  Surgeon: Acie Fredrickson Wonda Cheng, MD;  Location: [facility];  Service: Cardiovascular;  Laterality: N/A;  . TEMPORARY PACEMAKER N/A 10/25/2018   Procedure: TEMPORARY PACEMAKER;  Surgeon: Constance Haw, MD;  Location: [facility];  Service: Cardiovascular;  Laterality: N/A;  . TONSILLECTOMY  as child  . TOTAL KNEE ARTHROPLASTY Right 08/03/2014   Procedure: TOTAL RIGHT KNEE ARTHROPLASTY;  Surgeon: [name], MD;  Location: [facility];  Service: Orthopedics;  Laterality: Right;  . TRANSURETHRAL RESECTION OF PROSTATE N/A 08/06/2012   Procedure: Almyra Free OF PROSTATE WITH GYRUS ;  Surgeon: [name], MD;  Location: [facility];  Service: Urology;  Laterality: N/A;  . UMBILICAL HERNIA REPAIR  1990  . UVULECTOMY  2005   for sleep apnea  . UVULOPALATOPHARYNGOPLASTY  6 years ago    FAMILY HISTORY Family History  Problem Relation Age of Onset  . Heart attack Father 49       lived to 60  . Alzheimer's disease Mother   . Stroke Mother   . Breast cancer Mother   . Alzheimer's disease Maternal Grandmother   . Stroke Maternal Grandmother   . Lung cancer Maternal Grandmother   . Lung cancer Maternal Grandfather   . Cancer Paternal Grandmother        type unknown  . Diabetes Paternal Grandfather   . Other  Paternal Grandfather        Growth in the back of the head    SOCIAL HISTORY Social History   Tobacco Use  . Smoking status: Never Smoker  . Smokeless tobacco: Never Used  Substance Use Topics  . Alcohol use: Yes    Alcohol/week: 4.0 standard drinks    Types: 4 Standard drinks or equivalent per week    Comment: 3 drinks at night   . Drug use: No         OPHTHALMIC EXAM:  Base Eye Exam    Visual Acuity (Snellen - Linear)      Right Left  Dist [place] 20/40 -1 20/20   Dist ph [place] NI    Correction: Glasses       Tonometry (Tonopen, 8:35 AM)      Right Left   Pressure 16 15       Pupils      Dark Light Shape React APD   Right 2 1 Round Minimal 0   Left 2 1 Round Minimal 0       Visual Fields      Left Right    Full Full       Extraocular Movement      Right Left    Full Full       Neuro/Psych    Oriented x3: Yes   Mood/Affect: Normal       Dilation    Right eye: 1.0% Mydriacyl, 2.5% Phenylephrine @ 8:35 AM        Slit Lamp and Fundus Exam    Slit Lamp Exam      Right Left   Lids/Lashes Dermatochalasis - upper lid, Telangiectasia, Meibomian gland dysfunction Dermatochalasis - upper lid, Telangiectasia, Meibomian gland dysfunction   Conjunctiva/Sclera White and quiet White and quiet   Cornea Arcus, Well healed cataract wounds, 2+ inferior Punctate epithelial erosions Arcus, Well healed cataract wounds, Punctate epithelial erosions   Anterior Chamber Deep and quiet Deep and quiet   Iris Round and dilated Round and dilated   Lens 3 piece PC IOL in good position 3 piece PC IOL in good position, open PC   Vitreous Vitreous syneresis, +pigment, Posterior vitreous detachment, gas bubble 10-15%, satellite bubble centrally, +pigmented vitreous condensations Vitreous syneresis       Fundus Exam      Right Left   Disc trace Pallor, mild tilt, mild temporal PPA    C/D Ratio 0.3 0.4   Macula Re-attached, flat, focal residual SRF centrally and along demarcation  line    Vessels Vascular attenuation, Tortuous    Periphery retinal break at 1000--good early cryo changes; Superior temporal retina reattached under gas bubble; good cryo changes surrounding 1100 oclock tear; shallow SRF ST quadrant improved; ORIGINALLY: bullous RD with corregations from 0800-1230, HST at 1100           IMAGING AND PROCEDURES  Imaging and Procedures for _0 @  OCT, Retina - OU - Both Eyes       Right Eye Quality was good. Central Foveal Thickness: 282. Progression has been stable. Findings include subretinal fluid, myopic contour, no IRF, normal foveal contour (Stable pocket of subfoveal SRF; mild interval improvement in vitreous opacities).   Left Eye Quality was good. Central Foveal Thickness: 252. Progression has been stable. Findings include normal foveal contour, no IRF, no SRF (Partial PVD, mild ERM).   Notes *Images captured and stored on drive  Diagnosis / Impression:  OD: Stable pocket of subfoveal SRF; mild interval improvement in vitreous opacities; shallow peripheral SRF improved OS: NFP; no IRF/SRF  Clinical management:  See below  Abbreviations: NFP - Normal foveal profile. CME - cystoid macular edema. PED - pigment epithelial detachment. IRF - intraretinal fluid. SRF - subretinal fluid. EZ - ellipsoid zone. ERM - epiretinal membrane. ORA - outer retinal atrophy. ORT - outer retinal tubulation. SRHM - subretinal hyper-reflective material                 ASSESSMENT/PLAN:    ICD-10-CM   1. 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 Retinal edema  H35.81 OCT, Retina -  OU - Both Eyes  3. Essential hypertension  I10   4. Hypertensive retinopathy of both eyes  H35.033   5. Pseudophakia of both eyes  Z96.1   6. Retinal hole of right eye  H33.321     1,2. Rhegmatogenous retinal detachment, right eye  - bullous supeotemporal mac off detachment, onset of foveal involvement Thursday, 01/24/19, by pt history  - detached  from 0800-1230 with large HST at 01100 -- posterior SRF just crossed over fovea  - s/p pneumatic cryopexy OD (08.21.20)  - new retinal hole at 1000 noted on exam 02/04/19 -- s/p cryopexy  - retina reattached with tr residual subfoveal SRF - stable  - single gas bubble 10-15%  - pt reports good compliance with positioning  - shallow SRF ST periphery -- improved  - continue face down, left head tilt position 30 mins/hr  - decrease maxitrol ung to bedtime only OD; add ATs OD prn  - f/u 1 week-- POV -- DFE/OCT  3,4. Hypertensive retinopathy OU  - discussed importance of tight BP control  - monitor  5. Pseudophakia OU  - s/p CE/IOL OU (K. Hecker)  - IOLs in excellent position  - monitor  6. Retinal break at 1000 OD  - retinal break at 10 oclock -- almost to ora -- no SRF  - s/p cryoretinopexy OD (08.31.20) -- good early cryo changes present  - continue maxitrol ung QID OD     Ophthalmic Meds Ordered this visit:  No orders of the defined types were placed in this encounter.      Return in about 1 week (around 02/19/2019) for POV, RD OD, DFE, OCT.  There are no Patient Instructions on file for this visit.   Explained the diagnoses, plan, and follow up with the patient and they expressed understanding.  Patient expressed understanding of the importance of proper follow up care.   This document serves as a record of services personally performed by Gardiner Sleeper, MD, PhD. It was created on their behalf by Ernest Mallick, OA, an ophthalmic assistant. The creation of this record is the provider's dictation and/or activities during the visit.    Electronically signed by: Ernest Mallick, OA  09.08.2020 9:55 AM    Gardiner Sleeper, M.D., Ph.D. Diseases & Surgery of the Retina and Vitreous [facility]  I have reviewed the above documentation for accuracy and completeness, and I agree with the above. Gardiner Sleeper, M.D., Ph.D. 02/12/19 9:55  AM    Abbreviations: M myopia (nearsighted); A astigmatism; H hyperopia (farsighted); P presbyopia; Mrx spectacle prescription;  CTL contact lenses; OD right eye; OS left eye; OU both eyes  XT exotropia; ET esotropia; PEK punctate epithelial keratitis; PEE punctate epithelial erosions; DES dry eye syndrome; MGD meibomian gland dysfunction; ATs artificial tears; PFAT's preservative free artificial tears; [place] nuclear sclerotic cataract; PSC posterior subcapsular cataract; ERM epi-retinal membrane; PVD posterior vitreous detachment; RD retinal detachment; DM diabetes mellitus; DR diabetic retinopathy; NPDR non-proliferative diabetic retinopathy; PDR proliferative diabetic retinopathy; CSME clinically significant macular edema; DME diabetic macular edema; dbh dot blot hemorrhages; CWS cotton wool spot; POAG primary open angle glaucoma; C/D cup-to-disc ratio; HVF humphrey visual field; GVF goldmann visual field; OCT optical coherence tomography; IOP intraocular pressure; BRVO Branch retinal vein occlusion; CRVO central retinal vein occlusion; CRAO central retinal artery occlusion; BRAO branch retinal artery occlusion; RT retinal tear; SB scleral buckle; PPV pars plana vitrectomy; VH Vitreous hemorrhage; PRP panretinal laser photocoagulation; IVK intravitreal kenalog;  VMT vitreomacular traction; MH Macular hole;  NVD neovascularization of the disc; NVE neovascularization elsewhere; AREDS age related eye disease study; ARMD age related macular degeneration; POAG primary open angle glaucoma; EBMD epithelial/anterior basement membrane dystrophy; ACIOL anterior chamber intraocular lens; IOL intraocular lens; PCIOL posterior chamber intraocular lens; Phaco/IOL phacoemulsification with intraocular lens placement; [place] photorefractive keratectomy; LASIK laser assisted in situ keratomileusis; HTN hypertension; DM diabetes mellitus; COPD chronic obstructive pulmonary disease

## 2019-02-11 NOTE — Patient Instructions (Signed)
Medication Instructions:  Your physician recommends that you continue on your current medications as directed. Please refer to the Current Medication list given to you today.  Labwork: None ordered.  Testing/Procedures: None ordered.  Follow-Up: Your physician recommends that you schedule a follow-up appointment in:   9 months with Dr. Klein  Any Other Special Instructions Will Be Listed Below (If Applicable).     If you need a refill on your cardiac medications before your next appointment, please call your pharmacy.  

## 2019-02-11 NOTE — Progress Notes (Signed)
Patient Care Team: [name], MD as PCP - General (Internal Medicine)   HPI  [name] Glass is a 78 y.o. male Seen follouwp for pacer-Medtronic implanted in [place] 1/20 for sinus node dysfunction.  He has a history of syncope.  Presented 5/20 with fever and sepsis.  He had strep bacteremia.  He underwent pacemaker removal PICC line placement and pacemaker generator reimplantation  History of coronary artery disease with multiple prior stents.  During syncopal episode in [place], he underwent atherectomy of the LAD and DES stenting.  Records and Results Reviewed   Past Medical History:  Diagnosis Date  . Arthritis   . Coronary artery disease   . GERD (gastroesophageal reflux disease)   . H/O bronchitis   . H/O hiatal hernia   . History of kidney stones last in 1976  . Hyperlipidemia    statin intolerant, under control  . Hypertension    under control  . Hypertensive retinopathy    OU  . Hypothyroidism   . Sleep apnea    hx of, surgery to reverse    Past Surgical History:  Procedure Laterality Date  . CARDIAC CATHETERIZATION Right 2008   5 heart stents  . CATARACT EXTRACTION Bilateral 6 years ago  . CYSTOSCOPY N/A 08/06/2012   Procedure: CYSTOSCOPY;  Surgeon: [name], MD;  Location: [facility];  Service: Urology;  Laterality: N/A;  . ESOPHAGOGASTRIC FUNDOPLICATION  [id number]   spleen removed, 5 pints of blood given  . EYE SURGERY    . HERNIA REPAIR  1985   x4  . INGUINAL HERNIA REPAIR  1992  . PACEMAKER IMPLANT N/A 10/30/2018   Procedure: PACEMAKER IMPLANT;  Surgeon: Constance Haw, MD;  Location: [facility];  Service: Cardiovascular;  Laterality: N/A;  . PACEMAKER INSERTION    . PPM GENERATOR REMOVAL N/A 10/25/2018   Procedure: PPM GENERATOR REMOVAL;  Surgeon: Constance Haw, MD;  Location: [facility];  Service: Cardiovascular;  Laterality: N/A;  . PROSTATE ABLATION  2013   "laser ablation"  . RETINAL DETACHMENT SURGERY Right  01/24/2019   Pneumatic cryopexy - Dr. Bernarda Caffey  . TEE WITHOUT CARDIOVERSION N/A 10/25/2018   Procedure: TRANSESOPHAGEAL ECHOCARDIOGRAM (TEE);  Surgeon: Acie Fredrickson Wonda Cheng, MD;  Location: [facility];  Service: Cardiovascular;  Laterality: N/A;  . TEMPORARY PACEMAKER N/A 10/25/2018   Procedure: TEMPORARY PACEMAKER;  Surgeon: Constance Haw, MD;  Location: [place] [facility];  Service: Cardiovascular;  Laterality: N/A;  . TONSILLECTOMY  as child  . TOTAL KNEE ARTHROPLASTY Right 08/03/2014   Procedure: TOTAL RIGHT KNEE ARTHROPLASTY;  Surgeon: [name], MD;  Location: [facility];  Service: Orthopedics;  Laterality: Right;  . TRANSURETHRAL RESECTION OF PROSTATE N/A 08/06/2012   Procedure: [place]myra Free OF PROSTATE WITH GYRUS ;  Surgeon: [name], MD;  Location: [facility];  Service: Urology;  Laterality: N/A;  . UMBILICAL HERNIA REPAIR  1990  . UVULECTOMY  2005   for sleep apnea  . UVULOPALATOPHARYNGOPLASTY  6 years ago    Current Meds  Medication Sig  . acidophilus (RISAQUAD) CAPS capsule Take 1 capsule by mouth 2 (two) times daily.  [name] acyclovir (ZOVIRAX) 200 MG capsule TAKE 1 CAPSULE (200 MG TOTAL) BY MOUTH 3 (THREE) TIMES DAILY.  [name] anastrozole (ARIMIDEX) 1 MG tablet Take 1 mg by mouth 2 (two) times a week.   [name] aspirin EC 81 MG tablet Take 1 tablet (81 mg total) by mouth daily.  [name]  clopidogrel (PLAVIX) 75 MG tablet TAKE 1 TABLET BY MOUTH EVERY DAY  . DEXILANT 60 MG capsule Take 1 capsule by mouth daily.  [name] ezetimibe (ZETIA) 10 MG tablet Take 1 tablet (10 mg total) by mouth daily.  . fluticasone (FLONASE) 50 MCG/ACT nasal spray Place 2 sprays into both nostrils daily.  . hydrALAZINE (APRESOLINE) 25 MG tablet TAKE 1 TABLET BY MOUTH 3 TIMES A DAY  . liothyronine (CYTOMEL) 25 MCG tablet Take 25 mcg by mouth every morning.   . neomycin-polymyxin-dexameth (MAXITROL) 0.1 % OINT Place 1 application into the right eye 2 (two) times daily.  . Nutritional Supplements (DHEA PO) Take 25 mg by  mouth.  . Pitavastatin Calcium (LIVALO) 4 MG TABS Take 1 tablet (4 mg total) by mouth daily.  . tamsulosin (FLOMAX) 0.4 MG CAPS capsule Take 0.4 mg by mouth daily after breakfast.   . thyroid (ARMOUR) 60 MG tablet Take 60-120 mg by mouth 2 (two) times daily.     Allergies  Allergen Reactions  . Morphine And Related     Had "crazy dreams" felt "crazy" per pt.  . Bactrim [Sulfamethoxazole-Trimethoprim]   . Statins       Review of Systems negative except from HPI and PMH  Physical Exam BP 118/66   Pulse 64   Ht 6' (1.829 m)   Wt 213 lb 6.4 oz (96.8 kg)   SpO2 95%   BMI 28.94 kg/m  Well developed and well nourished in no acute distress HENT normal E scleral and icterus clear Neck Supple JVP flat; carotids brisk and full Clear to ausculation Device pocket well healed; without hematoma or erythema.  There is no tethering on the right. Regular rate and rhythm, no murmurs gallops or rub Soft with active bowel sounds No clubbing cyanosis issues 2 edema Alert and oriented, grossly normal motor and sensory function Skin Warm and Dry    Assessment and  Plan  Sinus node dysfunction  Syncope  Ischemic heart disease with prior stenting  Pacemaker-Medtronic  Explant and reimplant following strep infection 5/20    Pts device functioning normally.  Device reprogrammed for longevity.  No interval syncope.  Without symptoms of ischemia     Current medicines are reviewed at length with the patient today .  The patient does not  have concerns regarding medicines.

## 2019-02-12 ENCOUNTER — Encounter (INDEPENDENT_AMBULATORY_CARE_PROVIDER_SITE_OTHER): Payer: Self-pay | Admitting: Ophthalmology

## 2019-02-12 ENCOUNTER — Ambulatory Visit (INDEPENDENT_AMBULATORY_CARE_PROVIDER_SITE_OTHER): Payer: Medicare Other | Admitting: Ophthalmology

## 2019-02-12 DIAGNOSIS — Z961 Presence of intraocular lens: Secondary | ICD-10-CM | POA: Diagnosis not present

## 2019-02-12 DIAGNOSIS — H3581 Retinal edema: Secondary | ICD-10-CM | POA: Diagnosis not present

## 2019-02-12 DIAGNOSIS — H33011 Retinal detachment with single break, right eye: Secondary | ICD-10-CM | POA: Diagnosis not present

## 2019-02-12 DIAGNOSIS — H35033 Hypertensive retinopathy, bilateral: Secondary | ICD-10-CM | POA: Diagnosis not present

## 2019-02-12 DIAGNOSIS — I10 Essential (primary) hypertension: Secondary | ICD-10-CM

## 2019-02-12 DIAGNOSIS — H33321 Round hole, right eye: Secondary | ICD-10-CM

## 2019-02-15 ENCOUNTER — Other Ambulatory Visit: Payer: Self-pay | Admitting: Internal Medicine

## 2019-02-19 ENCOUNTER — Encounter (INDEPENDENT_AMBULATORY_CARE_PROVIDER_SITE_OTHER): Payer: Self-pay | Admitting: Ophthalmology

## 2019-02-19 ENCOUNTER — Ambulatory Visit (INDEPENDENT_AMBULATORY_CARE_PROVIDER_SITE_OTHER): Payer: Medicare Other | Admitting: Ophthalmology

## 2019-02-19 ENCOUNTER — Other Ambulatory Visit: Payer: Self-pay

## 2019-02-19 DIAGNOSIS — Z961 Presence of intraocular lens: Secondary | ICD-10-CM | POA: Diagnosis not present

## 2019-02-19 DIAGNOSIS — H35033 Hypertensive retinopathy, bilateral: Secondary | ICD-10-CM | POA: Diagnosis not present

## 2019-02-19 DIAGNOSIS — I10 Essential (primary) hypertension: Secondary | ICD-10-CM | POA: Diagnosis not present

## 2019-02-19 DIAGNOSIS — H33011 Retinal detachment with single break, right eye: Secondary | ICD-10-CM

## 2019-02-19 DIAGNOSIS — H33321 Round hole, right eye: Secondary | ICD-10-CM | POA: Diagnosis not present

## 2019-02-19 DIAGNOSIS — H3581 Retinal edema: Secondary | ICD-10-CM

## 2019-02-19 NOTE — Progress Notes (Signed)
[facility] Note  02/19/2019     CHIEF COMPLAINT Patient presents for Post-op Follow-up   HISTORY OF PRESENT ILLNESS: [name] Glass is a 78 y.o. male who presents to the clinic today for:   HPI    Post-op Follow-up    In right eye.  Discomfort includes none.  Vision is improved.  I, the attending physician,  performed the HPI with the patient and updated documentation appropriately.          Comments    Patient states his vision is improving OD but still feels like he is looking through a piece of gauze.  Patient states lines are wavy OD as well.  Patient denies eye pain or discomfort and denies any new or worsening floaters or fol OU.       Last edited by Bernarda Caffey, MD on 02/19/2019  3:45 PM. (History)    pt states the gas bubble is shrinking  Referring physician: [name], MD [address],  [place] [address]  HISTORICAL INFORMATION:   Selected notes from the MEDICAL RECORD NUMBER    CURRENT MEDICATIONS: Current Outpatient Medications (Ophthalmic Drugs)  Medication Sig  . neomycin-polymyxin-dexameth (MAXITROL) 0.1 % OINT Place 1 application into the right eye 2 (two) times daily.   No current facility-administered medications for this visit.  (Ophthalmic Drugs)   Current Outpatient Medications (Other)  Medication Sig  . acidophilus (RISAQUAD) CAPS capsule Take 1 capsule by mouth 2 (two) times daily.  [name] acyclovir (ZOVIRAX) 200 MG capsule TAKE 1 CAPSULE BY MOUTH 3 TIMES A DAY  . anastrozole (ARIMIDEX) 1 MG tablet Take 1 mg by mouth 2 (two) times a week.   [name] aspirin EC 81 MG tablet Take 1 tablet (81 mg total) by mouth daily.  . clopidogrel (PLAVIX) 75 MG tablet TAKE 1 TABLET BY MOUTH EVERY DAY  . DEXILANT 60 MG capsule Take 1 capsule by mouth daily.  [name] ezetimibe (ZETIA) 10 MG tablet Take 1 tablet (10 mg total) by mouth daily.  . fluticasone (FLONASE) 50 MCG/ACT nasal spray Place 2 sprays into both nostrils  daily.  . hydrALAZINE (APRESOLINE) 25 MG tablet TAKE 1 TABLET BY MOUTH 3 TIMES A DAY  . liothyronine (CYTOMEL) 25 MCG tablet Take 25 mcg by mouth every morning.   . Nutritional Supplements (DHEA PO) Take 25 mg by mouth.  . Pitavastatin Calcium (LIVALO) 4 MG TABS Take 1 tablet (4 mg total) by mouth daily.  . tamsulosin (FLOMAX) 0.4 MG CAPS capsule Take 0.4 mg by mouth daily after breakfast.   . thyroid (ARMOUR) 60 MG tablet Take 60-120 mg by mouth 2 (two) times daily.    No current facility-administered medications for this visit.  (Other)      REVIEW OF SYSTEMS: ROS    Positive for: Genitourinary, Musculoskeletal, Cardiovascular, Eyes, Respiratory   Negative for: Constitutional, Gastrointestinal, Neurological, Skin, HENT, Endocrine, Psychiatric, Allergic/Imm, Heme/Lymph   Last edited by Doneen Poisson on 02/19/2019  2:30 PM. (History)       ALLERGIES Allergies  Allergen Reactions  . Morphine And Related     Had "crazy dreams" felt "crazy" per pt.  . Bactrim [Sulfamethoxazole-Trimethoprim]   . Statins     PAST MEDICAL HISTORY Past Medical History:  Diagnosis Date  . Arthritis   . Coronary artery disease   . GERD (gastroesophageal reflux disease)   . H/O bronchitis   . H/O hiatal hernia   . History  of kidney stones last in 1976  . Hyperlipidemia    statin intolerant, under control  . Hypertension    under control  . Hypertensive retinopathy    OU  . Hypothyroidism   . Sleep apnea    hx of, surgery to reverse   Past Surgical History:  Procedure Laterality Date  . CARDIAC CATHETERIZATION Right 2008   5 heart stents  . CATARACT EXTRACTION Bilateral 6 years ago  . CYSTOSCOPY N/A 08/06/2012   Procedure: CYSTOSCOPY;  Surgeon: [name], MD;  Location: [facility];  Service: Urology;  Laterality: N/A;  . ESOPHAGOGASTRIC FUNDOPLICATION  1829   spleen removed, 5 pints of blood given  . EYE SURGERY    . HERNIA REPAIR  1985   x4  . INGUINAL HERNIA REPAIR  1992  .  PACEMAKER IMPLANT N/A 10/30/2018   Procedure: PACEMAKER IMPLANT;  Surgeon: Constance Haw, MD;  Location: [facility];  Service: Cardiovascular;  Laterality: N/A;  . PACEMAKER INSERTION    . PPM GENERATOR REMOVAL N/A 10/25/2018   Procedure: PPM GENERATOR REMOVAL;  Surgeon: Constance Haw, MD;  Location: [facility];  Service: Cardiovascular;  Laterality: N/A;  . PROSTATE ABLATION  2013   "laser ablation"  . RETINAL DETACHMENT SURGERY Right 01/24/2019   Pneumatic cryopexy - Dr. Bernarda Caffey  . TEE WITHOUT CARDIOVERSION N/A 10/25/2018   Procedure: TRANSESOPHAGEAL ECHOCARDIOGRAM (TEE);  Surgeon: Acie Fredrickson Wonda Cheng, MD;  Location: [facility];  Service: Cardiovascular;  Laterality: N/A;  . TEMPORARY PACEMAKER N/A 10/25/2018   Procedure: TEMPORARY PACEMAKER;  Surgeon: Constance Haw, MD;  Location: [facility];  Service: Cardiovascular;  Laterality: N/A;  . TONSILLECTOMY  as child  . TOTAL KNEE ARTHROPLASTY Right 08/03/2014   Procedure: TOTAL RIGHT KNEE ARTHROPLASTY;  Surgeon: [name], MD;  Location: [facility];  Service: Orthopedics;  Laterality: Right;  . TRANSURETHRAL RESECTION OF PROSTATE N/A 08/06/2012   Procedure: Almyra Free OF PROSTATE WITH GYRUS ;  Surgeon: [name], MD;  Location: [facility];  Service: Urology;  Laterality: N/A;  . UMBILICAL HERNIA REPAIR  1990  . UVULECTOMY  2005   for sleep apnea  . UVULOPALATOPHARYNGOPLASTY  6 years ago    FAMILY HISTORY Family History  Problem Relation Age of Onset  . Heart attack Father 12       lived to 40  . Alzheimer's disease Mother   . Stroke Mother   . Breast cancer Mother   . Alzheimer's disease Maternal Grandmother   . Stroke Maternal Grandmother   . Lung cancer Maternal Grandmother   . Lung cancer Maternal Grandfather   . Cancer Paternal Grandmother        type unknown  . Diabetes Paternal Grandfather   . Other Paternal Grandfather        Growth in the back of the head    SOCIAL  HISTORY Social History   Tobacco Use  . Smoking status: Never Smoker  . Smokeless tobacco: Never Used  Substance Use Topics  . Alcohol use: Yes    Alcohol/week: 4.0 standard drinks    Types: 4 Standard drinks or equivalent per week    Comment: 3 drinks at night   . Drug use: No         OPHTHALMIC EXAM:  Base Eye Exam    Visual Acuity (Snellen - Linear)      Right Left   Dist [place] 20/50 -2 20/20   Dist ph [place] 20/40 -2  Tonometry (Tonopen, 2:34 PM)      Right Left   Pressure 14 14       Pupils      Dark Light Shape React APD   Right 2 1 Round Minimal 0   Left 2 1 Round Minimal 0       Visual Fields      Left Right    Full Full       Extraocular Movement      Right Left    Full Full       Neuro/Psych    Oriented x3: Yes   Mood/Affect: Normal       Dilation    Both eyes: 1.0% Mydriacyl, 2.5% Phenylephrine @ 2:34 PM        Slit Lamp and Fundus Exam    Slit Lamp Exam      Right Left   Lids/Lashes Dermatochalasis - upper lid, Telangiectasia, Meibomian gland dysfunction Dermatochalasis - upper lid, Telangiectasia, Meibomian gland dysfunction   Conjunctiva/Sclera White and quiet White and quiet   Cornea Arcus, Well healed cataract wounds, 2+ inferior Punctate epithelial erosions Arcus, Well healed cataract wounds, Punctate epithelial erosions   Anterior Chamber Deep and quiet Deep and quiet   Iris Round and dilated Round and dilated   Lens 3 piece PC IOL in good position 3 piece PC IOL in good position, open PC   Vitreous Vitreous syneresis, +pigment, Posterior vitreous detachment, gas bubble 5%, +pigmented vitreous condensations Vitreous syneresis       Fundus Exam      Right Left   Disc trace Pallor, mild tilt, mild temporal PPA trace Pallor, mild tilt, mild temporal PPA, compact   C/D Ratio 0.3 0.4   Macula Re-attached, flat, focal residual SRF centrally and along demarcation line -- slightly improved Flat, Blunted foveal reflex, Retinal pigment  epithelial mottling, No heme or edema   Vessels Vascular attenuation, Tortuous Vascular attenuation, Tortuous   Periphery retinal break at 1000--good cryo changes; Superior temporal retina reattached; good cryo changes surrounding 1100 oclock tear; shallow SRF ST quadrant improved; ORIGINALLY: bullous RD with corregations from 0800-1230, HST at 1100 Attached, focal pigmented CR scar at 0200, No RT/RD on 360 scleral depression           IMAGING AND PROCEDURES  Imaging and Procedures for _0 @  OCT, Retina - OU - Both Eyes       Right Eye Quality was good. Central Foveal Thickness: 282. Progression has been stable. Findings include subretinal fluid, myopic contour, no IRF, normal foveal contour (Mild interval improvement in pocket of subfoveal SRF; mild interval improvement in vitreous opacities).   Left Eye Quality was good. Central Foveal Thickness: 250. Progression has been stable. Findings include normal foveal contour, no IRF, no SRF (Partial PVD, mild ERM).   Notes *Images captured and stored on drive  Diagnosis / Impression:  OD: Mild interval improvement in pocket of subfoveal SRF; mild interval improvement in vitreous opacities OS: NFP; no IRF/SRF  Clinical management:  See below  Abbreviations: NFP - Normal foveal profile. CME - cystoid macular edema. PED - pigment epithelial detachment. IRF - intraretinal fluid. SRF - subretinal fluid. EZ - ellipsoid zone. ERM - epiretinal membrane. ORA - outer retinal atrophy. ORT - outer retinal tubulation. SRHM - subretinal hyper-reflective material                 ASSESSMENT/PLAN:    ICD-10-CM   1. 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 Retinal edema  H35.81 OCT, Retina - OU - Both Eyes  3. Essential hypertension  I10   4. Hypertensive retinopathy of both eyes  H35.033   5. Pseudophakia of both eyes  Z96.1   6. Retinal hole of right eye  H33.321     1,2. Rhegmatogenous retinal detachment,  right eye  - bullous supeotemporal mac off detachment, onset of foveal involvement Thursday, 01/24/19, by pt history  - detached from 0800-1230 with large HST at 01100 -- posterior SRF just crossed over fovea  - s/p pneumatic cryopexy OD (08.21.20)  - new retinal hole at 1000 noted on exam 02/04/19 -- s/p cryopexy 9.1.20  - retina reattached with tr residual subfoveal SRF - stable to slightly improved  - single gas bubble now ~5%  - pt reports good compliance with positioning  - shallow SRF ST periphery -- improved  - continue face down positioning and avoidance of supine positioning  - continue maxitrol ung at bedtime only OD; add ATs OD prn  - f/u 1 week-- POV -- DFE/OCT  3,4. Hypertensive retinopathy OU  - discussed importance of tight BP control  - monitor  5. Pseudophakia OU  - s/p CE/IOL OU (K. Hecker)  - IOLs in excellent position  - monitor  6. Retinal break at 1000 OD  - retinal break at 10 oclock -- almost to ora -- no SRF  - s/p cryoretinopexy OD (08.31.20) -- good early cryo changes present   Ophthalmic Meds Ordered this visit:  No orders of the defined types were placed in this encounter.      Return in about 1 week (around 02/26/2019) for f/u RD OD, DFE, OCT.  There are no Patient Instructions on file for this visit.   Explained the diagnoses, plan, and follow up with the patient and they expressed understanding.  Patient expressed understanding of the importance of proper follow up care.   This document serves as a record of services personally performed by Gardiner Sleeper, MD, PhD. It was created on their behalf by Ernest Mallick, OA, an ophthalmic assistant. The creation of this record is the provider's dictation and/or activities during the visit.    Electronically signed by: Ernest Mallick, OA  09.16.2020 11:25 PM    Gardiner Sleeper, M.D., Ph.D. Diseases & Surgery of the Retina and Vitreous [facility]  I have reviewed the above  documentation for accuracy and completeness, and I agree with the above. Gardiner Sleeper, M.D., Ph.D. 02/19/19 11:25 PM    Abbreviations: M myopia (nearsighted); A astigmatism; H hyperopia (farsighted); P presbyopia; Mrx spectacle prescription;  CTL contact lenses; OD right eye; OS left eye; OU both eyes  XT exotropia; ET esotropia; PEK punctate epithelial keratitis; PEE punctate epithelial erosions; DES dry eye syndrome; MGD meibomian gland dysfunction; ATs artificial tears; PFAT's preservative free artificial tears; [place] nuclear sclerotic cataract; PSC posterior subcapsular cataract; ERM epi-retinal membrane; PVD posterior vitreous detachment; RD retinal detachment; DM diabetes mellitus; DR diabetic retinopathy; NPDR non-proliferative diabetic retinopathy; PDR proliferative diabetic retinopathy; CSME clinically significant macular edema; DME diabetic macular edema; dbh dot blot hemorrhages; CWS cotton wool spot; POAG primary open angle glaucoma; C/D cup-to-disc ratio; HVF humphrey visual field; GVF goldmann visual field; OCT optical coherence tomography; IOP intraocular pressure; BRVO Branch retinal vein occlusion; CRVO central retinal vein occlusion; CRAO central retinal artery occlusion; BRAO branch retinal artery occlusion; RT retinal tear; SB scleral buckle; PPV pars plana vitrectomy; VH Vitreous hemorrhage; PRP panretinal laser  photocoagulation; IVK intravitreal kenalog; VMT vitreomacular traction; MH Macular hole;  NVD neovascularization of the disc; NVE neovascularization elsewhere; AREDS age related eye disease study; ARMD age related macular degeneration; POAG primary open angle glaucoma; EBMD epithelial/anterior basement membrane dystrophy; ACIOL anterior chamber intraocular lens; IOL intraocular lens; PCIOL posterior chamber intraocular lens; Phaco/IOL phacoemulsification with intraocular lens placement; [place] photorefractive keratectomy; LASIK laser assisted in situ keratomileusis; HTN hypertension;  DM diabetes mellitus; COPD chronic obstructive pulmonary disease

## 2019-02-24 LAB — CUP PACEART INCLINIC DEVICE CHECK
Date Time Interrogation Session: 20200921113857
Implantable Lead Implant Date: 20200527
Implantable Lead Implant Date: 20200527
Implantable Lead Location: 753859
Implantable Lead Location: 753860
Implantable Lead Model: 5076
Implantable Lead Model: 5076
Implantable Pulse Generator Implant Date: 20200527

## 2019-02-25 NOTE — Progress Notes (Signed)
[facility] Note  02/26/2019     CHIEF COMPLAINT Patient presents for Post-op Follow-up   HISTORY OF PRESENT ILLNESS: [name] Glass is a 78 y.o. male who presents to the clinic today for:   HPI    Post-op Follow-up    In right eye.  Discomfort includes none.  Vision is improved.  I, the attending physician,  performed the HPI with the patient and updated documentation appropriately.          Comments    Patient states vision may be improving a little OD.  Patient states he has very minimal eye pain OD.  Patient denies any new or worsening floaters or fol OU.       Last edited by [name] Caffey, MD on 02/26/2019  1:50 PM. (History)    pt states the gas bubble is getting smaller, he states his vision may be improving, but is still "gauzy"  Referring physician: [name], MD [address],  [place] [address]  HISTORICAL INFORMATION:   Selected notes from the MEDICAL RECORD NUMBER    CURRENT MEDICATIONS: Current Outpatient Medications (Ophthalmic Drugs)  Medication Sig  . neomycin-polymyxin-dexameth (MAXITROL) 0.1 % OINT Place 1 application into the right eye 2 (two) times daily.  . prednisoLONE acetate (PRED FORTE) 1 % ophthalmic suspension Place 1 drop into the right eye 4 (four) times daily for 7 days.   No current facility-administered medications for this visit.  (Ophthalmic Drugs)   Current Outpatient Medications (Other)  Medication Sig  . acidophilus (RISAQUAD) CAPS capsule Take 1 capsule by mouth 2 (two) times daily.  [name] acyclovir (ZOVIRAX) 200 MG capsule TAKE 1 CAPSULE BY MOUTH 3 TIMES A DAY  . anastrozole (ARIMIDEX) 1 MG tablet Take 1 mg by mouth 2 (two) times a week.   [name] aspirin EC 81 MG tablet Take 1 tablet (81 mg total) by mouth daily.  . clopidogrel (PLAVIX) 75 MG tablet TAKE 1 TABLET BY MOUTH EVERY DAY  . DEXILANT 60 MG capsule Take 1 capsule by mouth daily.  [name] ezetimibe (ZETIA) 10 MG tablet Take 1  tablet (10 mg total) by mouth daily.  . fluticasone (FLONASE) 50 MCG/ACT nasal spray Place 2 sprays into both nostrils daily.  . hydrALAZINE (APRESOLINE) 25 MG tablet TAKE 1 TABLET BY MOUTH 3 TIMES A DAY  . liothyronine (CYTOMEL) 25 MCG tablet TAKE 1 TABLET BY MOUTH EVERY MORNING  . Nutritional Supplements (DHEA PO) Take 25 mg by mouth.  . Pitavastatin Calcium (LIVALO) 4 MG TABS Take 1 tablet (4 mg total) by mouth daily.  . tamsulosin (FLOMAX) 0.4 MG CAPS capsule TAKE 1 CAPSULE BY MOUTH EVERY DAY IN THE MORNING  . thyroid (ARMOUR) 60 MG tablet Take 60-120 mg by mouth 2 (two) times daily.    No current facility-administered medications for this visit.  (Other)      REVIEW OF SYSTEMS: ROS    Positive for: Genitourinary, Musculoskeletal, Cardiovascular, Eyes, Respiratory   Negative for: Constitutional, Gastrointestinal, Neurological, Skin, HENT, Endocrine, Psychiatric, Allergic/Imm, Heme/Lymph   Last edited by [name] Glass on 02/26/2019  1:34 PM. (History)       ALLERGIES Allergies  Allergen Reactions  . Morphine And Related     Had "crazy dreams" felt "crazy" per pt.  . Bactrim [Sulfamethoxazole-Trimethoprim]   . Statins     PAST MEDICAL HISTORY Past Medical History:  Diagnosis Date  . Arthritis   . Coronary artery disease   .  GERD (gastroesophageal reflux disease)   . H/O bronchitis   . H/O hiatal hernia   . History of kidney stones last in 1976  . Hyperlipidemia    statin intolerant, under control  . Hypertension    under control  . Hypertensive retinopathy    OU  . Hypothyroidism   . Sleep apnea    hx of, surgery to reverse   Past Surgical History:  Procedure Laterality Date  . CARDIAC CATHETERIZATION Right 2008   5 heart stents  . CATARACT EXTRACTION Bilateral 6 years ago  . CYSTOSCOPY N/A 08/06/2012   Procedure: CYSTOSCOPY;  Surgeon: [name] So, MD;  Location: [facility];  Service: Urology;  Laterality: N/A;  . ESOPHAGOGASTRIC FUNDOPLICATION  5035    spleen removed, 5 pints of blood given  . EYE SURGERY    . HERNIA REPAIR  1985   x4  . INGUINAL HERNIA REPAIR  1992  . PACEMAKER IMPLANT N/A 10/30/2018   Procedure: PACEMAKER IMPLANT;  Surgeon: [name] Haw, MD;  Location: [facility];  Service: Cardiovascular;  Laterality: N/A;  . PACEMAKER INSERTION    . PPM GENERATOR REMOVAL N/A 10/25/2018   Procedure: PPM GENERATOR REMOVAL;  Surgeon: [name] Haw, MD;  Location: [facility];  Service: Cardiovascular;  Laterality: N/A;  . PROSTATE ABLATION  2013   "laser ablation"  . RETINAL DETACHMENT SURGERY Right 01/24/2019   Pneumatic cryopexy - Dr. Bernarda Glass  . TEE WITHOUT CARDIOVERSION N/A 10/25/2018   Procedure: TRANSESOPHAGEAL ECHOCARDIOGRAM (TEE);  Surgeon: [name] Fredrickson Wonda Cheng, MD;  Location: [facility];  Service: Cardiovascular;  Laterality: N/A;  . TEMPORARY PACEMAKER N/A 10/25/2018   Procedure: TEMPORARY PACEMAKER;  Surgeon: [name] Haw, MD;  Location: [facility];  Service: Cardiovascular;  Laterality: N/A;  . TONSILLECTOMY  as child  . TOTAL KNEE ARTHROPLASTY Right 08/03/2014   Procedure: TOTAL RIGHT KNEE ARTHROPLASTY;  Surgeon: [name] Alf, MD;  Location: [facility];  Service: Orthopedics;  Laterality: Right;  . TRANSURETHRAL RESECTION OF PROSTATE N/A 08/06/2012   Procedure: [name] Glass OF PROSTATE WITH GYRUS ;  Surgeon: [name] So, MD;  Location: [facility];  Service: Urology;  Laterality: N/A;  . UMBILICAL HERNIA REPAIR  1990  . UVULECTOMY  2005   for sleep apnea  . UVULOPALATOPHARYNGOPLASTY  6 years ago    FAMILY HISTORY Family History  Problem Relation Age of Onset  . Heart attack Father 75       lived to 1  . Alzheimer's disease Mother   . Stroke Mother   . Breast cancer Mother   . Alzheimer's disease Maternal Grandmother   . Stroke Maternal Grandmother   . Lung cancer Maternal Grandmother   . Lung cancer Maternal Grandfather   . Cancer Paternal Grandmother        type unknown   . Diabetes Paternal Grandfather   . Other Paternal Grandfather        Growth in the back of the head    SOCIAL HISTORY Social History   Tobacco Use  . Smoking status: Never Smoker  . Smokeless tobacco: Never Used  Substance Use Topics  . Alcohol use: Yes    Alcohol/week: 4.0 standard drinks    Types: 4 Standard drinks or equivalent per week    Comment: 3 drinks at night   . Drug use: No         OPHTHALMIC EXAM:  Base Eye Exam    Visual Acuity (Snellen - Linear)  Right Left   Dist [place] 20/40 +1 20/20 -1   Dist ph [place] NI        Tonometry (Tonopen, 1:38 PM)      Right Left   Pressure 12 13       Pupils      Dark Light Shape React APD   Right 2 1 Round Minimal 0   Left 2 1 Round Minimal 0       Visual Fields      Left Right    Full Full       Extraocular Movement      Right Left    Full Full       Neuro/Psych    Oriented x3: Yes   Mood/Affect: Normal       Dilation    Right eye: 1.0% Mydriacyl, 2.5% Phenylephrine @ 1:38 PM        Slit Lamp and Fundus Exam    Slit Lamp Exam      Right Left   Lids/Lashes Dermatochalasis - upper lid, Meibomian gland dysfunction Dermatochalasis - upper lid, Telangiectasia, Meibomian gland dysfunction   Conjunctiva/Sclera White and quiet White and quiet   Cornea Arcus, Well healed cataract wounds, trace inferior Punctate epithelial erosions Arcus, Well healed cataract wounds, Punctate epithelial erosions   Anterior Chamber Deep and quiet Deep and quiet   Iris Round and dilated Round and dilated   Lens 3 piece PC IOL in good position 3 piece PC IOL in good position, open PC   Vitreous Vitreous syneresis, +pigment, Posterior vitreous detachment, gas bubble 1-2%, +pigmented vitreous condensations Vitreous syneresis       Fundus Exam      Right Left   Disc trace Pallor, mild tilt, mild temporal PPA trace Pallor, mild tilt, mild temporal PPA, compact   C/D Ratio 0.3 0.4   Macula Re-attached, flat, focal residual SRF  centrally and along demarcation line -- slightly improved Flat, Blunted foveal reflex, Retinal pigment epithelial mottling, No heme or edema   Vessels Vascular attenuation, Tortuous Vascular attenuation, Tortuous   Periphery New tear at retinal break at 1000--good cryo changes; Superior temporal retina reattached; good cryo changes surrounding 1100 oclock tear; shallow SRF ST quadrant improved; ORIGINALLY: bullous RD with corregations from 0800-1230, HST at 1100 Attached, focal pigmented CR scar at 0200, No RT/RD on 360 scleral depression           IMAGING AND PROCEDURES  Imaging and Procedures for _0 @  OCT, Retina - OU - Both Eyes       Right Eye Quality was good. Central Foveal Thickness: 291. Progression has been stable. Findings include subretinal fluid, myopic contour, no IRF, normal foveal contour (Persistent pocket of subfoveal SRF; mild ERM, mild interval improvement in vitreous opacities).   Left Eye Quality was good. Central Foveal Thickness: 253. Progression has been stable. Findings include normal foveal contour, no IRF, no SRF (Partial PVD, mild ERM).   Notes *Images captured and stored on drive  Diagnosis / Impression:  OD: persistent pocket of subfoveal SRF; mild ERM, mild interval improvement in vitreous opacities OS: NFP; no IRF/SRF  Clinical management:  See below  Abbreviations: NFP - Normal foveal profile. CME - cystoid macular edema. PED - pigment epithelial detachment. IRF - intraretinal fluid. SRF - subretinal fluid. EZ - ellipsoid zone. ERM - epiretinal membrane. ORA - outer retinal atrophy. ORT - outer retinal tubulation. SRHM - subretinal hyper-reflective material        Repair Retinal Breaks, Laser -  OD - Right Eye       LASER PROCEDURE NOTE  Procedure:  Barrier laser retinopexy using laser indirect ophthalmoscope, RIGHT eye   Diagnosis:   New retinal tear, RIGHT eye                     Flap tear at 1130 o'clock anterior to equator    Surgeon: [name] Caffey, MD, PhD  Anesthesia: Topical, subconjunctival block, retrobulbar block  Informed consent obtained, operative eye marked, and time out performed prior to initiation of laser.   Laser settings:  Lumenis [id number] laser indirect ophthalmoscope Power: 320 mW Duration: 70 msec  # spots: 549  Placement of laser: Laser was placed in three confluent rows around new flap tear at 1130 oclock anterior to equator with additional rows anteriorly.  Complications: None.  Patient tolerated the procedure well and received written and verbal post-procedure care information/education.                 ASSESSMENT/PLAN:    ICD-10-CM   1. 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 Retinal edema  H35.81 OCT, Retina - OU - Both Eyes  3. Essential hypertension  I10   4. Hypertensive retinopathy of both eyes  H35.033   5. Pseudophakia of both eyes  Z96.1   6. Retinal hole of right eye  H33.321 Repair Retinal Breaks, Laser - OD - Right Eye    1,2. Rhegmatogenous retinal detachment, right eye  - bullous supeotemporal mac off detachment, onset of foveal involvement Thursday, 01/24/19, by pt history  - detached from 0800-1230 with large HST at 01100 -- posterior SRF just crossed over fovea  - s/p pneumatic cryopexy OD (08.21.20)  - new retinal hole at 1000 noted on exam 02/04/19 -- s/p cryopexy 9.1.20  - new retinal tear at 1145 noted on exam 09.23.20 -- no SRF or RD  - recommend laser retinopexy OD today, 09.23.20 for new RT at 1145  - pt wishes to proceed  - RBA of procedure discussed, questions answered  - informed consent obtained and signed  - see procedure note  - start PF QID OD x7d  - retina reattached with tr residual subfoveal SRF - persistent  - single gas bubble now ~1-2%  - shallow SRF ST periphery -- improved  - avoid supine positioning  - continue maxitrol ung at bedtime only  OD; add ATs OD prn  - f/u 1 week-- POV -- DFE/OCT  3,4. Hypertensive retinopathy OU  - discussed importance of tight BP control  - monitor  5. Pseudophakia OU  - s/p CE/IOL OU (K. Hecker)  - IOLs in excellent position  - monitor  6. New retinal tear at 1130 OD  - bilobed flap tear at 1130 OD  - recommend laser retinopexy OD today as above  - retinal break at 10 oclock -- s/p cryoretinopexy OD (08.31.20) -- good cryo changes present   Ophthalmic Meds Ordered this visit:  Meds ordered this encounter  Medications  . prednisoLONE acetate (PRED FORTE) 1 % ophthalmic suspension    Sig: Place 1 drop into the right eye 4 (four) times daily for 7 days.    Dispense:  10 mL    Refill:  0       Return in about 1 week (around 03/05/2019) for POV.  There are no Patient Instructions on file for this visit.   Explained the diagnoses,  plan, and follow up with the patient and they expressed understanding.  Patient expressed understanding of the importance of proper follow up care.   This document serves as a record of services personally performed by Gardiner Sleeper, MD, PhD. It was created on their behalf by Roselee Nova, COMT. The creation of this record is the provider's dictation and/or activities during the visit.  Electronically signed by: Roselee Nova, COMT 02/26/19 7:47 PM  Gardiner Sleeper, M.D., Ph.D. Diseases & Surgery of the Retina and Vitreous [facility]   I have reviewed the above documentation for accuracy and completeness, and I agree with the above. Gardiner Sleeper, M.D., Ph.D. 02/26/19 7:47 PM    Abbreviations: M myopia (nearsighted); A astigmatism; H hyperopia (farsighted); P presbyopia; Mrx spectacle prescription;  CTL contact lenses; OD right eye; OS left eye; OU both eyes  XT exotropia; ET esotropia; PEK punctate epithelial keratitis; PEE punctate epithelial erosions; DES dry eye syndrome; MGD meibomian gland dysfunction; ATs artificial  tears; PFAT's preservative Glass artificial tears; [place] nuclear sclerotic cataract; PSC posterior subcapsular cataract; ERM epi-retinal membrane; PVD posterior vitreous detachment; RD retinal detachment; DM diabetes mellitus; DR diabetic retinopathy; NPDR non-proliferative diabetic retinopathy; PDR proliferative diabetic retinopathy; CSME clinically significant macular edema; DME diabetic macular edema; dbh dot blot hemorrhages; CWS cotton wool spot; POAG primary open angle glaucoma; C/D cup-to-disc ratio; HVF humphrey visual field; GVF goldmann visual field; OCT optical coherence tomography; IOP intraocular pressure; BRVO Branch retinal vein occlusion; CRVO central retinal vein occlusion; CRAO central retinal artery occlusion; BRAO branch retinal artery occlusion; RT retinal tear; SB scleral buckle; PPV pars plana vitrectomy; VH Vitreous hemorrhage; PRP panretinal laser photocoagulation; IVK intravitreal kenalog; VMT vitreomacular traction; MH Macular hole;  NVD neovascularization of the disc; NVE neovascularization elsewhere; AREDS age related eye disease study; ARMD age related macular degeneration; POAG primary open angle glaucoma; EBMD epithelial/anterior basement membrane dystrophy; ACIOL anterior chamber intraocular lens; IOL intraocular lens; PCIOL posterior chamber intraocular lens; Phaco/IOL phacoemulsification with intraocular lens placement; [place] photorefractive keratectomy; LASIK laser assisted in situ keratomileusis; HTN hypertension; DM diabetes mellitus; COPD chronic obstructive pulmonary disease

## 2019-02-26 ENCOUNTER — Encounter (INDEPENDENT_AMBULATORY_CARE_PROVIDER_SITE_OTHER): Payer: Self-pay | Admitting: Ophthalmology

## 2019-02-26 ENCOUNTER — Other Ambulatory Visit: Payer: Self-pay | Admitting: Internal Medicine

## 2019-02-26 ENCOUNTER — Ambulatory Visit (INDEPENDENT_AMBULATORY_CARE_PROVIDER_SITE_OTHER): Payer: Medicare Other | Admitting: Ophthalmology

## 2019-02-26 ENCOUNTER — Other Ambulatory Visit: Payer: Self-pay

## 2019-02-26 DIAGNOSIS — H33321 Round hole, right eye: Secondary | ICD-10-CM | POA: Diagnosis not present

## 2019-02-26 DIAGNOSIS — Z961 Presence of intraocular lens: Secondary | ICD-10-CM | POA: Diagnosis not present

## 2019-02-26 DIAGNOSIS — H33011 Retinal detachment with single break, right eye: Secondary | ICD-10-CM

## 2019-02-26 DIAGNOSIS — I10 Essential (primary) hypertension: Secondary | ICD-10-CM

## 2019-02-26 DIAGNOSIS — H35033 Hypertensive retinopathy, bilateral: Secondary | ICD-10-CM

## 2019-02-26 DIAGNOSIS — H3581 Retinal edema: Secondary | ICD-10-CM

## 2019-02-26 MED ORDER — PREDNISOLONE ACETATE 1 % OP SUSP: 1.0000 [drp] | Freq: Four times a day (QID) | OPHTHALMIC | 0 refills | Status: AC

## 2019-03-05 ENCOUNTER — Encounter (INDEPENDENT_AMBULATORY_CARE_PROVIDER_SITE_OTHER): Payer: Self-pay | Admitting: Ophthalmology

## 2019-03-05 ENCOUNTER — Ambulatory Visit (INDEPENDENT_AMBULATORY_CARE_PROVIDER_SITE_OTHER): Payer: Medicare Other | Admitting: Ophthalmology

## 2019-03-05 DIAGNOSIS — I10 Essential (primary) hypertension: Secondary | ICD-10-CM | POA: Diagnosis not present

## 2019-03-05 DIAGNOSIS — H33011 Retinal detachment with single break, right eye: Secondary | ICD-10-CM

## 2019-03-05 DIAGNOSIS — H35033 Hypertensive retinopathy, bilateral: Secondary | ICD-10-CM | POA: Diagnosis not present

## 2019-03-05 DIAGNOSIS — H33321 Round hole, right eye: Secondary | ICD-10-CM | POA: Diagnosis not present

## 2019-03-05 DIAGNOSIS — H3581 Retinal edema: Secondary | ICD-10-CM | POA: Diagnosis not present

## 2019-03-05 DIAGNOSIS — Z961 Presence of intraocular lens: Secondary | ICD-10-CM

## 2019-03-05 NOTE — Progress Notes (Signed)
[facility] Note  03/05/2019     CHIEF COMPLAINT Patient presents for Post-op Follow-up   HISTORY OF PRESENT ILLNESS: [name] Glass is a 78 y.o. male who presents to the clinic today for:   HPI    Post-op Follow-up    In right eye.  Vision is worse, is blurred at distance and is blurred at near.  I, the attending physician,  performed the HPI with the patient and updated documentation appropriately.          Comments    78 y/o male pt here for 1 wk f/u.  S/p pneumatic cryopexy OD 8.21.20 and s/p laser retinopexy OD 9.23.20.  VA OD very dim and blurred since laser OD last wk.  No changes reported OS.  Denies pain, flashes, floaters.  PF QID OD.       Last edited by Bernarda Caffey, MD on 03/05/2019  2:13 PM. (History)    pt states he did not do as well on the eye chart today, he states he can see the letters, but they look like they have black fuzz over them, she states the gas bubble in completely gone now  Referring physician: [name], MD [address],  [place] [address]  HISTORICAL INFORMATION:   Selected notes from the MEDICAL RECORD NUMBER    CURRENT MEDICATIONS: Current Outpatient Medications (Ophthalmic Drugs)  Medication Sig  . neomycin-polymyxin-dexameth (MAXITROL) 0.1 % OINT Place 1 application into the right eye 2 (two) times daily.  . prednisoLONE acetate (PRED FORTE) 1 % ophthalmic suspension Place 1 drop into the right eye 4 (four) times daily for 7 days.   No current facility-administered medications for this visit.  (Ophthalmic Drugs)   Current Outpatient Medications (Other)  Medication Sig  . acidophilus (RISAQUAD) CAPS capsule Take 1 capsule by mouth 2 (two) times daily.  [name] acyclovir (ZOVIRAX) 200 MG capsule TAKE 1 CAPSULE BY MOUTH 3 TIMES A DAY  . anastrozole (ARIMIDEX) 1 MG tablet Take 1 mg by mouth 2 (two) times a week.   [name] aspirin EC 81 MG tablet Take 1 tablet (81 mg total) by mouth daily.  .  clopidogrel (PLAVIX) 75 MG tablet TAKE 1 TABLET BY MOUTH EVERY DAY  . DEXILANT 60 MG capsule Take 1 capsule by mouth daily.  [name] ezetimibe (ZETIA) 10 MG tablet Take 1 tablet (10 mg total) by mouth daily.  . fluticasone (FLONASE) 50 MCG/ACT nasal spray Place 2 sprays into both nostrils daily.  . hydrALAZINE (APRESOLINE) 25 MG tablet TAKE 1 TABLET BY MOUTH 3 TIMES A DAY  . liothyronine (CYTOMEL) 25 MCG tablet TAKE 1 TABLET BY MOUTH EVERY MORNING  . Nutritional Supplements (DHEA PO) Take 25 mg by mouth.  . Pitavastatin Calcium (LIVALO) 4 MG TABS Take 1 tablet (4 mg total) by mouth daily.  . tamsulosin (FLOMAX) 0.4 MG CAPS capsule TAKE 1 CAPSULE BY MOUTH EVERY DAY IN THE MORNING  . thyroid (ARMOUR) 60 MG tablet Take 60-120 mg by mouth 2 (two) times daily.    No current facility-administered medications for this visit.  (Other)      REVIEW OF SYSTEMS: ROS    Positive for: Genitourinary, Musculoskeletal, Cardiovascular, Eyes, Respiratory   Negative for: Constitutional, Gastrointestinal, Neurological, Skin, HENT, Endocrine, Psychiatric, Allergic/Imm, Heme/Lymph   Last edited by Matthew Folks, COA on 03/05/2019  1:48 PM. (History)       ALLERGIES Allergies  Allergen Reactions  . Morphine And  Related     Had "crazy dreams" felt "crazy" per pt.  . Bactrim [Sulfamethoxazole-Trimethoprim]   . Statins     PAST MEDICAL HISTORY Past Medical History:  Diagnosis Date  . Arthritis   . Coronary artery disease   . GERD (gastroesophageal reflux disease)   . H/O bronchitis   . H/O hiatal hernia   . History of kidney stones last in 1976  . Hyperlipidemia    statin intolerant, under control  . Hypertension    under control  . Hypertensive retinopathy    OU  . Hypothyroidism   . Retinal detachment    OD  . Sleep apnea    hx of, surgery to reverse   Past Surgical History:  Procedure Laterality Date  . CARDIAC CATHETERIZATION Right 2008   5 heart stents  . CATARACT EXTRACTION  Bilateral 6 years ago  . CYSTOSCOPY N/A 08/06/2012   Procedure: CYSTOSCOPY;  Surgeon: [name], MD;  Location: [facility];  Service: Urology;  Laterality: N/A;  . ESOPHAGOGASTRIC FUNDOPLICATION  8295   spleen removed, 5 pints of blood given  . EYE SURGERY    . HERNIA REPAIR  1985   x4  . INGUINAL HERNIA REPAIR  1992  . PACEMAKER IMPLANT N/A 10/30/2018   Procedure: PACEMAKER IMPLANT;  Surgeon: Constance Haw, MD;  Location: [facility];  Service: Cardiovascular;  Laterality: N/A;  . PACEMAKER INSERTION    . PPM GENERATOR REMOVAL N/A 10/25/2018   Procedure: PPM GENERATOR REMOVAL;  Surgeon: Constance Haw, MD;  Location: [facility];  Service: Cardiovascular;  Laterality: N/A;  . PROSTATE ABLATION  2013   "laser ablation"  . RETINAL DETACHMENT SURGERY Right 01/24/2019   Pneumatic cryopexy - Dr. Bernarda Caffey  . TEE WITHOUT CARDIOVERSION N/A 10/25/2018   Procedure: TRANSESOPHAGEAL ECHOCARDIOGRAM (TEE);  Surgeon: Acie Fredrickson Wonda Cheng, MD;  Location[facility];  Service: Cardiovascular;  Laterality: N/A;  . TEMPORARY PACEMAKER N/A 10/25/2018   Procedure: TEMPORARY PACEMAKER;  Surgeon: Constance Haw, MD;  Location: [facility];  Service: Cardiovascular;  Laterality: N/A;  . TONSILLECTOMY  as child  . TOTAL KNEE ARTHROPLASTY Right 08/03/2014   Procedure: TOTAL RIGHT KNEE ARTHROPLASTY;  Surgeon: [name], MD;  Location: [facility];  Service: Orthopedics;  Laterality: Right;  . TRANSURETHRAL RESECTION OF PROSTATE N/A 08/06/2012   Procedure: Almyra Free OF PROSTATE WITH GYRUS ;  Surgeon: [name], MD;  Location: [facility];  Service: Urology;  Laterality: N/A;  . UMBILICAL HERNIA REPAIR  1990  . UVULECTOMY  2005   for sleep apnea  . UVULOPALATOPHARYNGOPLASTY  6 years ago    FAMILY HISTORY Family History  Problem Relation Age of Onset  . Heart attack Father 55       lived to 86  . Alzheimer's disease Mother   . Stroke Mother   . Breast cancer Mother   .  Alzheimer's disease Maternal Grandmother   . Stroke Maternal Grandmother   . Lung cancer Maternal Grandmother   . Lung cancer Maternal Grandfather   . Cancer Paternal Grandmother        type unknown  . Diabetes Paternal Grandfather   . Other Paternal Grandfather        Growth in the back of the head    SOCIAL HISTORY Social History   Tobacco Use  . Smoking status: Never Smoker  . Smokeless tobacco: Never Used  Substance Use Topics  . Alcohol use: Yes  Alcohol/week: 4.0 standard drinks    Types: 4 Standard drinks or equivalent per week    Comment: 3 drinks at night   . Drug use: No         OPHTHALMIC EXAM:  Base Eye Exam    Visual Acuity (Snellen - Linear)      Right Left   Dist Shannon 20/60 20/20 -2   Dist ph  20/50 -2        Tonometry (Tonopen, 1:51 PM)      Right Left   Pressure 9 9       Pupils      Dark Light Shape React APD   Right 2 1.5 Round Minimal None   Left 2 1.5 Round Minimal None       Visual Fields (Counting fingers)      Left Right    Full Full       Extraocular Movement      Right Left    Full, Ortho Full, Ortho       Neuro/Psych    Oriented x3: Yes   Mood/Affect: Normal       Dilation    Both eyes: 1.0% Mydriacyl, 2.5% Phenylephrine @ 1:51 PM        Slit Lamp and Fundus Exam    Slit Lamp Exam      Right Left   Lids/Lashes Dermatochalasis - upper lid, Meibomian gland dysfunction Dermatochalasis - upper lid, Telangiectasia, Meibomian gland dysfunction   Conjunctiva/Sclera White and quiet White and quiet   Cornea Arcus, Well healed cataract wounds, trace inferior Punctate epithelial erosions Arcus, Well healed cataract wounds, 1+Punctate epithelial erosions   Anterior Chamber Deep and quiet Deep and quiet   Iris Round and moderatey dilated to 75m Round and moderately dilated to 691m  Lens 3 piece PC IOL in good position 3 piece PC IOL in good position, open PC   Vitreous Vitreous syneresis, +pigment, Posterior vitreous  detachment, gas bubble gone, +pigmented vitreous condensations Vitreous syneresis       Fundus Exam      Right Left   Disc trace Pallor, mild tilt, mild temporal PPA trace Pallor, mild tilt, mild temporal PPA, compact   C/D Ratio 0.3 0.4   Macula Re-attached, flat, focal residual SRF centrally and along demarcation line -- slightly improved, inferior Epiretinal membrane Flat, Blunted foveal reflex, Retinal pigment epithelial mottling, No heme or edema   Vessels Vascular attenuation, Tortuous Vascular attenuation, Tortuous   Periphery Superior temporal retina reattached; good cryo changes surrounding 1100 oclock tear and 1000 hole; shallow SRF ST quadrant improved; good laser changes ST quad ORIGINALLY: bullous RD with corregations from 0800-1230, HST at 1100 Attached, focal pigmented CR scar at 0200, No RT/RD on 360 scleral depression           IMAGING AND PROCEDURES  Imaging and Procedures for _0 @  OCT, Retina - OU - Both Eyes       Right Eye Quality was good. Central Foveal Thickness: 281. Progression has been stable. Findings include subretinal fluid, myopic contour, no IRF, normal foveal contour (Interval improvement in pocket of subfoveal SRF; mild ERM, mild interval improvement in vitreous opacities; retinal thickening and cystic changes inferior to disc -- progression of ERM, ?PVR).   Left Eye Quality was good. Central Foveal Thickness: 253. Progression has been stable. Findings include normal foveal contour, no IRF, no SRF (Partial PVD, mild ERM).   Notes *Images captured and stored on drive  Diagnosis / Impression:  OD:  mild interval improvement in pocket of subfoveal SRF; mild ERM, mild interval improvement in vitreous opacities; retinal thickening and cystic changes inferior to disc -- progression of ERM, ?PVR OS: NFP; no IRF/SRF  Clinical management:  See below  Abbreviations: NFP - Normal foveal profile. CME - cystoid macular edema. PED - pigment epithelial  detachment. IRF - intraretinal fluid. SRF - subretinal fluid. EZ - ellipsoid zone. ERM - epiretinal membrane. ORA - outer retinal atrophy. ORT - outer retinal tubulation. SRHM - subretinal hyper-reflective material                 ASSESSMENT/PLAN:    ICD-10-CM   1. 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 Retinal edema  H35.81 OCT, Retina - OU - Both Eyes  3. Essential hypertension  I10   4. Hypertensive retinopathy of both eyes  H35.033   5. Pseudophakia of both eyes  Z96.1   6. Retinal hole of right eye  H33.321     1,2. Rhegmatogenous retinal detachment, right eye  - bullous supeotemporal mac off detachment, onset of foveal involvement Thursday, 01/24/19, by pt history  - detached from 0800-1230 with large HST at 1100 -- posterior SRF just crossed over fovea  - s/p pneumatic cryopexy OD (08.21.20)  - s/p laser retinopexy OD (09.23.20)  - new retinal hole at 1000 noted on exam 02/04/19 -- s/p cryopexy 9.1.20  - new retinal tear at 1145 noted on exam 09.23.20 -- no SRF or IRF  - completed PF QID OD x7d  - retina reattached with tr residual subfoveal SRF - persistent  - single gas bubble now gone  - shallow SRF ST periphery -- improved  - significant pigmented vitreous debris  - ERM / early PVR noted inferior to disc -- monitor  - continue maxitrol ung at bedtime only OD; add ATs OD prn  - f/u 10-14 d-- POV -- DFE/OCT; sooner prn  3,4. Hypertensive retinopathy OU  - discussed importance of tight BP control  - monitor  5. Pseudophakia OU  - s/p CE/IOL OU (K. Hecker)  - IOLs in excellent position  - monitor  6. Retinal tears   - bilobed flap tear at 1130 OD -- s/p laser retinopexy OD 9.23.20  - retinal break at 10 oclock -- s/p cryoretinopexy OD (9.1.20) -- good cryo changes present   Ophthalmic Meds Ordered this visit:  No orders of the defined types were placed in this encounter.      Return for 10-14 days, RD OD, DFE, OCT, OPTOS  (colors).  There are no Patient Instructions on file for this visit.   Explained the diagnoses, plan, and follow up with the patient and they expressed understanding.  Patient expressed understanding of the importance of proper follow up care.   This document serves as a record of services personally performed by Gardiner Sleeper, MD, PhD. It was created on their behalf by Ernest Mallick, OA, an ophthalmic assistant. The creation of this record is the provider's dictation and/or activities during the visit.    Electronically signed by: Ernest Mallick, OA  09.30.2020 10:28 PM   Gardiner Sleeper, M.D., Ph.D. Diseases & Surgery of the Retina and Vitreous [facility]  I have reviewed the above documentation for accuracy and completeness, and I agree with the above. Gardiner Sleeper, M.D., Ph.D. 03/05/19 10:28 PM    Abbreviations: M myopia (nearsighted); A astigmatism; H hyperopia (farsighted); P presbyopia; Mrx spectacle prescription;  CTL contact lenses; OD right eye; OS left eye; OU both eyes  XT exotropia; ET esotropia; PEK punctate epithelial keratitis; PEE punctate epithelial erosions; DES dry eye syndrome; MGD meibomian gland dysfunction; ATs artificial tears; PFAT's preservative free artificial tears; Kirvin nuclear sclerotic cataract; PSC posterior subcapsular cataract; ERM epi-retinal membrane; PVD posterior vitreous detachment; RD retinal detachment; DM diabetes mellitus; DR diabetic retinopathy; NPDR non-proliferative diabetic retinopathy; PDR proliferative diabetic retinopathy; CSME clinically significant macular edema; DME diabetic macular edema; dbh dot blot hemorrhages; CWS cotton wool spot; POAG primary open angle glaucoma; C/D cup-to-disc ratio; HVF humphrey visual field; GVF goldmann visual field; OCT optical coherence tomography; IOP intraocular pressure; BRVO Branch retinal vein occlusion; CRVO central retinal vein occlusion; CRAO central retinal artery occlusion;  BRAO branch retinal artery occlusion; RT retinal tear; SB scleral buckle; PPV pars plana vitrectomy; VH Vitreous hemorrhage; PRP panretinal laser photocoagulation; IVK intravitreal kenalog; VMT vitreomacular traction; MH Macular hole;  NVD neovascularization of the disc; NVE neovascularization elsewhere; AREDS age related eye disease study; ARMD age related macular degeneration; POAG primary open angle glaucoma; EBMD epithelial/anterior basement membrane dystrophy; ACIOL anterior chamber intraocular lens; IOL intraocular lens; PCIOL posterior chamber intraocular lens; Phaco/IOL phacoemulsification with intraocular lens placement; Whittlesey photorefractive keratectomy; LASIK laser assisted in situ keratomileusis; HTN hypertension; DM diabetes mellitus; COPD chronic obstructive pulmonary disease

## 2019-03-17 ENCOUNTER — Encounter (INDEPENDENT_AMBULATORY_CARE_PROVIDER_SITE_OTHER): Payer: Medicare Other | Admitting: Ophthalmology

## 2019-03-18 ENCOUNTER — Ambulatory Visit (INDEPENDENT_AMBULATORY_CARE_PROVIDER_SITE_OTHER): Payer: Medicare Other | Admitting: Ophthalmology

## 2019-03-18 ENCOUNTER — Encounter (INDEPENDENT_AMBULATORY_CARE_PROVIDER_SITE_OTHER): Payer: Self-pay | Admitting: Ophthalmology

## 2019-03-18 DIAGNOSIS — H35033 Hypertensive retinopathy, bilateral: Secondary | ICD-10-CM

## 2019-03-18 DIAGNOSIS — Z961 Presence of intraocular lens: Secondary | ICD-10-CM

## 2019-03-18 DIAGNOSIS — H33321 Round hole, right eye: Secondary | ICD-10-CM | POA: Diagnosis not present

## 2019-03-18 DIAGNOSIS — H33011 Retinal detachment with single break, right eye: Secondary | ICD-10-CM | POA: Diagnosis not present

## 2019-03-18 DIAGNOSIS — H3581 Retinal edema: Secondary | ICD-10-CM

## 2019-03-18 DIAGNOSIS — I10 Essential (primary) hypertension: Secondary | ICD-10-CM

## 2019-03-18 NOTE — Progress Notes (Signed)
[facility] Note  03/18/2019     CHIEF COMPLAINT Patient presents for Retina Follow Up   HISTORY OF PRESENT ILLNESS: [name] Glass is a 78 y.o. male who presents to the clinic today for:   HPI    Retina Follow Up    Patient presents with  Retinal Break/Detachment.  In right eye.  This started 13 days ago.  Severity is moderate.  I, the attending physician,  performed the HPI with the patient and updated documentation appropriately.          Comments    Patient here for 13 days retina follow up for RD OD. Patient states vision not doing too good. Like looking through a piece of gauge. Somewhat distorted. Wavy lines. Os is fine. No eye pain.       Last edited by Bernarda Caffey, MD on 03/18/2019  4:50 PM. (History)    pt states he still feels like he is looking through a piece of gauze, he states he can see a lot of "trash" in his eye and his vision is distorted, he states he had a hard time with the eye chart today bc the lines were wavy  Referring physician:   HISTORICAL INFORMATION:   Selected notes from the MEDICAL RECORD NUMBER    CURRENT MEDICATIONS: Current Outpatient Medications (Ophthalmic Drugs)  Medication Sig  . neomycin-polymyxin-dexameth (MAXITROL) 0.1 % OINT Place 1 application into the right eye 2 (two) times daily.   No current facility-administered medications for this visit.  (Ophthalmic Drugs)   Current Outpatient Medications (Other)  Medication Sig  . acidophilus (RISAQUAD) CAPS capsule Take 1 capsule by mouth 2 (two) times daily.  [name] acyclovir (ZOVIRAX) 200 MG capsule TAKE 1 CAPSULE BY MOUTH 3 TIMES A DAY  . anastrozole (ARIMIDEX) 1 MG tablet Take 1 mg by mouth 2 (two) times a week.   [name] aspirin EC 81 MG tablet Take 1 tablet (81 mg total) by mouth daily.  . clopidogrel (PLAVIX) 75 MG tablet TAKE 1 TABLET BY MOUTH EVERY DAY  . DEXILANT 60 MG capsule Take 1 capsule by mouth daily.  [name] ezetimibe (ZETIA) 10 MG tablet Take 1  tablet (10 mg total) by mouth daily.  . fluticasone (FLONASE) 50 MCG/ACT nasal spray Place 2 sprays into both nostrils daily.  . hydrALAZINE (APRESOLINE) 25 MG tablet TAKE 1 TABLET BY MOUTH 3 TIMES A DAY  . liothyronine (CYTOMEL) 25 MCG tablet TAKE 1 TABLET BY MOUTH EVERY MORNING  . Nutritional Supplements (DHEA PO) Take 25 mg by mouth.  . Pitavastatin Calcium (LIVALO) 4 MG TABS Take 1 tablet (4 mg total) by mouth daily.  . tamsulosin (FLOMAX) 0.4 MG CAPS capsule TAKE 1 CAPSULE BY MOUTH EVERY DAY IN THE MORNING  . thyroid (ARMOUR) 60 MG tablet Take 60-120 mg by mouth 2 (two) times daily.    No current facility-administered medications for this visit.  (Other)      REVIEW OF SYSTEMS: ROS    Positive for: Genitourinary, Musculoskeletal, Cardiovascular, Eyes, Respiratory   Negative for: Constitutional, Gastrointestinal, Neurological, Skin, HENT, Endocrine, Psychiatric, Allergic/Imm, Heme/Lymph   Last edited by Theodore Demark, COA on 03/18/2019  1:49 PM. (History)       ALLERGIES Allergies  Allergen Reactions  . Morphine And Related     Had "crazy dreams" felt "crazy" per pt.  . Bactrim [Sulfamethoxazole-Trimethoprim]   . Statins     PAST MEDICAL HISTORY Past Medical History:  Diagnosis Date  .  Arthritis   . Coronary artery disease   . GERD (gastroesophageal reflux disease)   . H/O bronchitis   . H/O hiatal hernia   . History of kidney stones last in 1976  . Hyperlipidemia    statin intolerant, under control  . Hypertension    under control  . Hypertensive retinopathy    OU  . Hypothyroidism   . Retinal detachment    OD  . Sleep apnea    hx of, surgery to reverse   Past Surgical History:  Procedure Laterality Date  . CARDIAC CATHETERIZATION Right 2008   5 heart stents  . CATARACT EXTRACTION Bilateral 6 years ago  . CYSTOSCOPY N/A 08/06/2012   Procedure: CYSTOSCOPY;  Surgeon: [name], MD;  Location: [facility];  Service: Urology;  Laterality: N/A;  .  ESOPHAGOGASTRIC FUNDOPLICATION  4627   spleen removed, 5 pints of blood given  . EYE SURGERY    . HERNIA REPAIR  1985   x4  . INGUINAL HERNIA REPAIR  1992  . PACEMAKER IMPLANT N/A 10/30/2018   Procedure: PACEMAKER IMPLANT;  Surgeon: Constance Haw, MD;  Location: [facility];  Service: Cardiovascular;  Laterality: N/A;  . PACEMAKER INSERTION    . PPM GENERATOR REMOVAL N/A 10/25/2018   Procedure: PPM GENERATOR REMOVAL;  Surgeon: Constance Haw, MD;  Location: [facility];  Service: Cardiovascular;  Laterality: N/A;  . PROSTATE ABLATION  2013   "laser ablation"  . RETINAL DETACHMENT SURGERY Right 01/24/2019   Pneumatic cryopexy - Dr. Bernarda Caffey  . TEE WITHOUT CARDIOVERSION N/A 10/25/2018   Procedure: TRANSESOPHAGEAL ECHOCARDIOGRAM (TEE);  Surgeon: Acie Fredrickson Wonda Cheng, MD;  Location: [facility]COPY;  Service: Cardiovascular;  Laterality: N/A;  . TEMPORARY PACEMAKER N/A 10/25/2018   Procedure: TEMPORARY PACEMAKER;  Surgeon: Constance Haw, MD;  Location: [facility];  Service: Cardiovascular;  Laterality: N/A;  . TONSILLECTOMY  as child  . TOTAL KNEE ARTHROPLASTY Right 08/03/2014   Procedure: TOTAL RIGHT KNEE ARTHROPLASTY;  Surgeon: [name], MD;  Location: [facility];  Service: Orthopedics;  Laterality: Right;  . TRANSURETHRAL RESECTION OF PROSTATE N/A 08/06/2012   Procedure: Al[place] Free OF PROSTATE WITH GYRUS ;  Surgeon: [name], MD;  Location: [facility];  Service: Urology;  Laterality: N/A;  . UMBILICAL HERNIA REPAIR  1990  . UVULECTOMY  2005   for sleep apnea  . UVULOPALATOPHARYNGOPLASTY  6 years ago    FAMILY HISTORY Family History  Problem Relation Age of Onset  . Heart attack Father 72       lived to 51  . Alzheimer's disease Mother   . Stroke Mother   . Breast cancer Mother   . Alzheimer's disease Maternal Grandmother   . Stroke Maternal Grandmother   . Lung cancer Maternal Grandmother   . Lung cancer Maternal Grandfather   . Cancer  Paternal Grandmother        type unknown  . Diabetes Paternal Grandfather   . Other Paternal Grandfather        Growth in the back of the head    SOCIAL HISTORY Social History   Tobacco Use  . Smoking status: Never Smoker  . Smokeless tobacco: Never Used  Substance Use Topics  . Alcohol use: Yes    Alcohol/week: 4.0 standard drinks    Types: 4 Standard drinks or equivalent per week    Comment: 3 drinks at night   . Drug use: No  OPHTHALMIC EXAM:  Base Eye Exam    Visual Acuity (Snellen - Linear)      Right Left   Dist [place] 20/60 +1 20/20   Dist ph [place] 20/50 -2        Tonometry (Tonopen, 1:44 PM)      Right Left   Pressure 11 13       Pupils      Dark Light Shape React APD   Right 2 1.5 Round Brisk None   Left 2 1.5 Round Brisk None       Visual Fields (Counting fingers)      Left Right    Full Full       Extraocular Movement      Right Left    Full, Ortho Full, Ortho       Neuro/Psych    Oriented x3: Yes   Mood/Affect: Normal       Dilation    Both eyes: 1.0% Mydriacyl, 2.5% Phenylephrine @ 1:44 PM        Slit Lamp and Fundus Exam    Slit Lamp Exam      Right Left   Lids/Lashes Dermatochalasis - upper lid, Meibomian gland dysfunction Dermatochalasis - upper lid, Telangiectasia, Meibomian gland dysfunction   Conjunctiva/Sclera White and quiet White and quiet   Cornea Arcus, Well healed cataract wounds, 1+ inferior Punctate epithelial erosions Arcus, Well healed cataract wounds, 1+Punctate epithelial erosions   Anterior Chamber Deep and quiet Deep and quiet   Iris Round and moderatey dilated to 38m Round and moderately dilated to 650m  Lens 3 piece PC IOL in good position 3 piece PC IOL in good position, open PC   Vitreous Vitreous syneresis, 2-3+pigment, Posterior vitreous detachment, dense vitreous condensations Vitreous syneresis       Fundus Exam      Right Left   Disc Pink and sharp, mild temporal PPA trace Pallor, mild tilt, mild  temporal PPA, compact   C/D Ratio 0.3 0.4   Macula attached, flat, focal residual SRF centrally and along demarcation line -- slightly improved, inferior Epiretinal membrane Flat, Blunted foveal reflex, Retinal pigment epithelial mottling, No heme or edema   Vessels Vascular attenuation, Tortuous Vascular attenuation, Tortuous   Periphery Superior temporal retina reattached; good cryo changes surrounding 1100 oclock tear and 1000 hole; shallow SRF ST quadrant improved; good laser changes ST quad ORIGINALLY: bullous RD with corregations from 0800-1230, HST at 1100 Attached, focal pigmented CR scar at 0200, No RT/RD on 360 scleral depression           IMAGING AND PROCEDURES  Imaging and Procedures for _0 @  OCT, Retina - OU - Both Eyes       Right Eye Quality was good. Central Foveal Thickness: 289. Progression has worsened. Findings include subretinal fluid, myopic contour, no IRF, normal foveal contour (Persistent pocket of subfoveal SRF; interval increase in ERM and retinal thickening inf nasal macula and inferior periphery -- early PVR; persistent vitreous opacities; ).   Left Eye Quality was good. Central Foveal Thickness: 253. Progression has been stable. Findings include normal foveal contour, no IRF, no SRF (Partial PVD, mild ERM).   Notes *Images captured and stored on drive  Diagnosis / Impression:  OD: retina reattached; persistent pocket of subfoveal SRF; interval increase in ERM and retinal thickening inf nasal macula and inferior periphery -- early PVR; persistent vitreous opacities OS: NFP; no IRF/SRF  Clinical management:  See below  Abbreviations: NFP - Normal foveal profile. CME -  cystoid macular edema. PED - pigment epithelial detachment. IRF - intraretinal fluid. SRF - subretinal fluid. EZ - ellipsoid zone. ERM - epiretinal membrane. ORA - outer retinal atrophy. ORT - outer retinal tubulation. SRHM - subretinal hyper-reflective material                  ASSESSMENT/PLAN:    ICD-10-CM   1. 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 Retinal edema  H35.81 OCT, Retina - OU - Both Eyes  3. Essential hypertension  I10   4. Hypertensive retinopathy of both eyes  H35.033   5. Pseudophakia of both eyes  Z96.1   6. Retinal hole of right eye  H33.321     1,2. Rhegmatogenous retinal detachment, right eye  - bullous supeotemporal mac off detachment, onset of foveal involvement Thursday, 01/24/19, by pt history  - detached from 0800-1230 with large HST at 1100 -- posterior SRF just crossed over fovea  - s/p pneumatic cryopexy OD (08.21.20)  - s/p laser retinopexy OD (09.23.20)  - new retinal hole at 1000 noted on exam 02/04/19 -- s/p cryopexy 9.1.20  - new retinal tear at 1145 noted on exam 09.23.20 -- no SRF or IRF  - retina reattached with tr residual subfoveal SRF - persistent  - shallow SRF ST periphery - stably resolved  - significant pigmented vitreous debris  - ERM / early PVR inf nasal macula and inferior to disc -- progressing  - discussed findings, prognosis and treatment options  - recommend 25g PPV w/ MP and possible air vs gas for vitreous opacities and ERM/preretinal fibrosis/PVR  - RBA of procedure discussed, questions answered  - informed consent obtained and signed  - will schedule sx for April 03, 2019, 11:30AM, [facility] OR 8  - will need pre op medical clearance from PCP and cardiology and pre-op COVID test  - continue maxitrol ung at bedtime only OD; add ATs OD prn  - f/u next Friday, 10.23.20 for pre-op check  3,4. Hypertensive retinopathy OU  - discussed importance of tight BP control  - monitor  5. Pseudophakia OU  - s/p CE/IOL OU (K. Hecker)  - IOLs in excellent position  - monitor  6. Retinal tears   - bilobed flap tear at 1130 OD -- s/p laser retinopexy OD 9.23.20  - retinal break at 10 oclock -- s/p cryoretinopexy OD (9.1.20) -- good cryo changes present   Ophthalmic Meds Ordered this visit:   No orders of the defined types were placed in this encounter.      Return in about 10 days (around 03/28/2019) for f/u RD.  There are no Patient Instructions on file for this visit.   Explained the diagnoses, plan, and follow up with the patient and they expressed understanding.  Patient expressed understanding of the importance of proper follow up care.   This document serves as a record of services personally performed by Gardiner Sleeper, MD, PhD. It was created on their behalf by Ernest Mallick, OA, an ophthalmic assistant. The creation of this record is the provider's dictation and/or activities during the visit.    Electronically signed by: Ernest Mallick, OA 10.13.2020 5:15 PM   Gardiner Sleeper, M.D., Ph.D. Diseases & Surgery of the Retina and Vitreous [facility]  I have reviewed the above documentation for accuracy and completeness, and I agree with the above. Gardiner Sleeper, M.D., Ph.D. 03/18/19 5:15 PM    Abbreviations: M myopia (nearsighted);  A astigmatism; H hyperopia (farsighted); P presbyopia; Mrx spectacle prescription;  CTL contact lenses; OD right eye; OS left eye; OU both eyes  XT exotropia; ET esotropia; PEK punctate epithelial keratitis; PEE punctate epithelial erosions; DES dry eye syndrome; MGD meibomian gland dysfunction; ATs artificial tears; PFAT's preservative free artificial tears; [place] nuclear sclerotic cataract; PSC posterior subcapsular cataract; ERM epi-retinal membrane; PVD posterior vitreous detachment; RD retinal detachment; DM diabetes mellitus; DR diabetic retinopathy; NPDR non-proliferative diabetic retinopathy; PDR proliferative diabetic retinopathy; CSME clinically significant macular edema; DME diabetic macular edema; dbh dot blot hemorrhages; CWS cotton wool spot; POAG primary open angle glaucoma; C/D cup-to-disc ratio; HVF humphrey visual field; GVF goldmann visual field; OCT optical coherence tomography; IOP intraocular  pressure; BRVO Branch retinal vein occlusion; CRVO central retinal vein occlusion; CRAO central retinal artery occlusion; BRAO branch retinal artery occlusion; RT retinal tear; SB scleral buckle; PPV pars plana vitrectomy; VH Vitreous hemorrhage; PRP panretinal laser photocoagulation; IVK intravitreal kenalog; VMT vitreomacular traction; MH Macular hole;  NVD neovascularization of the disc; NVE neovascularization elsewhere; AREDS age related eye disease study; ARMD age related macular degeneration; POAG primary open angle glaucoma; EBMD epithelial/anterior basement membrane dystrophy; ACIOL anterior chamber intraocular lens; IOL intraocular lens; PCIOL posterior chamber intraocular lens; Phaco/IOL phacoemulsification with intraocular lens placement; [place] photorefractive keratectomy; LASIK laser assisted in situ keratomileusis; HTN hypertension; DM diabetes mellitus; COPD chronic obstructive pulmonary disease

## 2019-03-19 ENCOUNTER — Telehealth: Payer: Self-pay

## 2019-03-19 ENCOUNTER — Other Ambulatory Visit: Payer: Self-pay | Admitting: Internal Medicine

## 2019-03-19 NOTE — Telephone Encounter (Signed)
Dr. [name], please comment on aspirin and palvix, patient had DES and atherectomy of LAD in Jan 2020. Currently on aspirin and plavix. Pending eye surgery for retinal detachment. Is it ok for [name] Glass to come off of aspirin and plavix for 5-7 days prior to surgery?  Please forward your response to P [facility] PREOP

## 2019-03-19 NOTE — Telephone Encounter (Signed)
Request for surgical clearance:  1. What type of surgery is being performed? RIGHT PARS PLANA VITRECTOMY/ MEMBRANE PEEL  2. When is this surgery scheduled? 04-03-2019   3. What type of clearance is required (medical clearance vs. Pharmacy clearance to hold med vs. Both)? BOTH  4. Are there any medications that need to be held prior to surgery and how long? NOT LISTED-PT TAKES PLAVIX AND ASA   5. Practice name and name of physician performing surgery? [facility] DR BRIAN ZAMORA   6. What is your office phone number  [phone]    7.   What is your office fax number  [phone]  8.   Anesthesia type (None, local, MAC, general) ? GENERAL

## 2019-03-20 NOTE — Telephone Encounter (Signed)
Okay to hold antiplatelet drugs for ophthalmologic procedure

## 2019-03-20 NOTE — Telephone Encounter (Signed)
   Primary Cardiologist: Quay Burow, MD  Chart reviewed as part of pre-operative protocol coverage. Given past medical history and time since last visit, based on ACC/AHA guidelines, [name] Glass would be at acceptable risk for the planned procedure without further cardiovascular testing.   I will route this recommendation to the requesting party via Epic fax function and remove from pre-op pool.  Please call with questions.  Rosaria Ferries, PA-C 03/20/2019, 9:41 AM   5. [facility] DR BRIAN ZAMORA   6. What is your office phone number  [phone]    7.   What is your office fax number  [phone]

## 2019-03-21 DIAGNOSIS — Z961 Presence of intraocular lens: Secondary | ICD-10-CM | POA: Diagnosis not present

## 2019-03-21 DIAGNOSIS — H3321 Serous retinal detachment, right eye: Secondary | ICD-10-CM | POA: Diagnosis not present

## 2019-03-21 DIAGNOSIS — H43391 Other vitreous opacities, right eye: Secondary | ICD-10-CM | POA: Diagnosis not present

## 2019-03-21 DIAGNOSIS — H35371 Puckering of macula, right eye: Secondary | ICD-10-CM | POA: Diagnosis not present

## 2019-03-28 ENCOUNTER — Encounter (INDEPENDENT_AMBULATORY_CARE_PROVIDER_SITE_OTHER): Payer: Medicare Other | Admitting: Ophthalmology

## 2019-03-28 DIAGNOSIS — N400 Enlarged prostate without lower urinary tract symptoms: Secondary | ICD-10-CM | POA: Diagnosis not present

## 2019-03-28 DIAGNOSIS — I498 Other specified cardiac arrhythmias: Secondary | ICD-10-CM | POA: Diagnosis not present

## 2019-03-28 DIAGNOSIS — I2510 Atherosclerotic heart disease of native coronary artery without angina pectoris: Secondary | ICD-10-CM | POA: Diagnosis not present

## 2019-03-28 DIAGNOSIS — H35371 Puckering of macula, right eye: Secondary | ICD-10-CM | POA: Diagnosis not present

## 2019-03-28 DIAGNOSIS — Z950 Presence of cardiac pacemaker: Secondary | ICD-10-CM | POA: Diagnosis not present

## 2019-03-28 DIAGNOSIS — I10 Essential (primary) hypertension: Secondary | ICD-10-CM | POA: Diagnosis not present

## 2019-03-28 DIAGNOSIS — Z20828 Contact with and (suspected) exposure to other viral communicable diseases: Secondary | ICD-10-CM | POA: Diagnosis not present

## 2019-03-28 DIAGNOSIS — J302 Other seasonal allergic rhinitis: Secondary | ICD-10-CM | POA: Diagnosis not present

## 2019-03-28 DIAGNOSIS — E039 Hypothyroidism, unspecified: Secondary | ICD-10-CM | POA: Diagnosis not present

## 2019-03-28 DIAGNOSIS — Z01812 Encounter for preprocedural laboratory examination: Secondary | ICD-10-CM | POA: Diagnosis not present

## 2019-03-28 DIAGNOSIS — H33001 Unspecified retinal detachment with retinal break, right eye: Secondary | ICD-10-CM | POA: Diagnosis not present

## 2019-03-28 DIAGNOSIS — G4733 Obstructive sleep apnea (adult) (pediatric): Secondary | ICD-10-CM | POA: Diagnosis not present

## 2019-03-28 DIAGNOSIS — Z79899 Other long term (current) drug therapy: Secondary | ICD-10-CM | POA: Diagnosis not present

## 2019-03-28 DIAGNOSIS — R9431 Abnormal electrocardiogram [ECG] [EKG]: Secondary | ICD-10-CM | POA: Diagnosis not present

## 2019-03-28 DIAGNOSIS — Z955 Presence of coronary angioplasty implant and graft: Secondary | ICD-10-CM | POA: Diagnosis not present

## 2019-03-28 DIAGNOSIS — E785 Hyperlipidemia, unspecified: Secondary | ICD-10-CM | POA: Diagnosis not present

## 2019-03-28 DIAGNOSIS — K219 Gastro-esophageal reflux disease without esophagitis: Secondary | ICD-10-CM | POA: Diagnosis not present

## 2019-03-28 DIAGNOSIS — H43391 Other vitreous opacities, right eye: Secondary | ICD-10-CM | POA: Diagnosis not present

## 2019-03-28 DIAGNOSIS — H3321 Serous retinal detachment, right eye: Secondary | ICD-10-CM | POA: Diagnosis not present

## 2019-03-28 DIAGNOSIS — I252 Old myocardial infarction: Secondary | ICD-10-CM | POA: Diagnosis not present

## 2019-03-31 ENCOUNTER — Other Ambulatory Visit (HOSPITAL_COMMUNITY): Payer: Medicare Other

## 2019-03-31 ENCOUNTER — Other Ambulatory Visit: Payer: Self-pay | Admitting: Internal Medicine

## 2019-04-01 ENCOUNTER — Ambulatory Visit (INDEPENDENT_AMBULATORY_CARE_PROVIDER_SITE_OTHER)
Admission: RE | Admit: 2019-04-01 | Discharge: 2019-04-01 | Disposition: A | Discharge status: Home / Self Care | Payer: Medicare Other | Source: Ambulatory Visit | Attending: Internal Medicine | Admitting: Internal Medicine

## 2019-04-01 ENCOUNTER — Ambulatory Visit (INDEPENDENT_AMBULATORY_CARE_PROVIDER_SITE_OTHER): Payer: Medicare Other | Admitting: Internal Medicine

## 2019-04-01 ENCOUNTER — Other Ambulatory Visit (INDEPENDENT_AMBULATORY_CARE_PROVIDER_SITE_OTHER): Payer: Medicare Other

## 2019-04-01 ENCOUNTER — Encounter: Payer: Self-pay | Admitting: Internal Medicine

## 2019-04-01 ENCOUNTER — Other Ambulatory Visit: Payer: Self-pay

## 2019-04-01 VITALS — BP 158/78 | HR 60 | Temp 98.0°F | Ht 72.0 in | Wt 213.0 lb

## 2019-04-01 DIAGNOSIS — N182 Chronic kidney disease, stage 2 (mild): Secondary | ICD-10-CM | POA: Diagnosis not present

## 2019-04-01 DIAGNOSIS — Z0000 Encounter for general adult medical examination without abnormal findings: Secondary | ICD-10-CM

## 2019-04-01 DIAGNOSIS — I10 Essential (primary) hypertension: Secondary | ICD-10-CM

## 2019-04-01 DIAGNOSIS — M542 Cervicalgia: Secondary | ICD-10-CM | POA: Diagnosis not present

## 2019-04-01 DIAGNOSIS — I2510 Atherosclerotic heart disease of native coronary artery without angina pectoris: Secondary | ICD-10-CM

## 2019-04-01 DIAGNOSIS — J301 Allergic rhinitis due to pollen: Secondary | ICD-10-CM | POA: Diagnosis not present

## 2019-04-01 DIAGNOSIS — M5030 Other cervical disc degeneration, unspecified cervical region: Secondary | ICD-10-CM | POA: Diagnosis not present

## 2019-04-01 DIAGNOSIS — Z23 Encounter for immunization: Secondary | ICD-10-CM

## 2019-04-01 DIAGNOSIS — E039 Hypothyroidism, unspecified: Secondary | ICD-10-CM | POA: Diagnosis not present

## 2019-04-01 DIAGNOSIS — I129 Hypertensive chronic kidney disease with stage 1 through stage 4 chronic kidney disease, or unspecified chronic kidney disease: Secondary | ICD-10-CM | POA: Diagnosis not present

## 2019-04-01 LAB — CBC WITH DIFFERENTIAL/PLATELET
Basophils Absolute: 0.1 10*3/uL (ref 0.0–0.1)
Basophils Relative: 1 % (ref 0.0–3.0)
Eosinophils Absolute: 0.1 10*3/uL (ref 0.0–0.7)
Eosinophils Relative: 2.4 % (ref 0.0–5.0)
HCT: 50.5 % (ref 39.0–52.0)
Hemoglobin: 16.5 g/dL (ref 13.0–17.0)
Lymphocytes Relative: 37.2 % (ref 12.0–46.0)
Lymphs Abs: 1.9 10*3/uL (ref 0.7–4.0)
MCHC: 32.7 g/dL (ref 30.0–36.0)
MCV: 92.8 fl (ref 78.0–100.0)
Monocytes Absolute: 0.9 10*3/uL (ref 0.1–1.0)
Monocytes Relative: 18 % — ABNORMAL HIGH (ref 3.0–12.0)
Neutro Abs: 2.2 10*3/uL (ref 1.4–7.7)
Neutrophils Relative %: 41.4 % — ABNORMAL LOW (ref 43.0–77.0)
Platelets: 315 10*3/uL (ref 150.0–400.0)
RBC: 5.44 Mil/uL (ref 4.22–5.81)
RDW: 15.5 % (ref 11.5–15.5)
WBC: 5.2 10*3/uL (ref 4.0–10.5)

## 2019-04-01 LAB — BASIC METABOLIC PANEL
BUN: 14 mg/dL (ref 6–23)
CO2: 28 mEq/L (ref 19–32)
Calcium: 10 mg/dL (ref 8.4–10.5)
Chloride: 106 mEq/L (ref 96–112)
Creatinine, Ser: 1.1 mg/dL (ref 0.40–1.50)
GFR: 64.62 mL/min (ref >60.00)
Glucose, Bld: 95 mg/dL (ref 70–99)
Potassium: 4.4 mEq/L (ref 3.5–5.1)
Sodium: 141 mEq/L (ref 135–145)

## 2019-04-01 MED ORDER — ZOSTER VAC RECOMB ADJUVANTED 50 MCG/0.5ML IM SUSR: 0.5000 mL | Freq: Once | INTRAMUSCULAR | 1 refills | Status: AC

## 2019-04-01 MED ORDER — AZELASTINE HCL 0.1 % NA SOLN: 2.0000 | Freq: Two times a day (BID) | NASAL | 3 refills | Status: DC

## 2019-04-01 NOTE — Progress Notes (Signed)
Subjective:  Patient ID: [name] Glass, male    DOB: 08-12-1940  Age: 78 y.o. MRN: [id number]  CC: Hypertension, Hypothyroidism, and Allergic Rhinitis    HPI ARYEL MINICUCCI presents for f/up - He complains of chronic worsening right-sided neck pain and a gradual decrease in his range of motion in the neck.  There is no pain that radiates towards his arms or legs and he denies paresthesias.  He has been receiving Armour Thyroid from a physician in [place].  He has been taking 120 mg a day.  He tells me the physician in [place] has recently died.  Outpatient Medications Prior to Visit  Medication Sig Dispense Refill   acidophilus (RISAQUAD) CAPS capsule Take 1 capsule by mouth 2 (two) times daily.     acyclovir (ZOVIRAX) 200 MG capsule TAKE 1 CAPSULE BY MOUTH 3 TIMES A DAY 270 capsule 0   anastrozole (ARIMIDEX) 1 MG tablet Take 1 mg by mouth 2 (two) times a week.      aspirin EC 81 MG tablet Take 1 tablet (81 mg total) by mouth daily. 90 tablet 3   clopidogrel (PLAVIX) 75 MG tablet TAKE 1 TABLET BY MOUTH EVERY DAY 90 tablet 0   DEXILANT 60 MG capsule Take 1 capsule by mouth daily.  1   ezetimibe (ZETIA) 10 MG tablet Take 1 tablet (10 mg total) by mouth daily. 90 tablet 1   fluticasone (FLONASE) 50 MCG/ACT nasal spray Place 2 sprays into both nostrils daily. 48 mL 1   hydrALAZINE (APRESOLINE) 25 MG tablet TAKE 1 TABLET BY MOUTH 3 TIMES A DAY 270 tablet 1   liothyronine (CYTOMEL) 25 MCG tablet TAKE 1 TABLET BY MOUTH EVERY MORNING 90 tablet 0   neomycin-polymyxin-dexameth (MAXITROL) 0.1 % OINT Place 1 application into the right eye 2 (two) times daily.     Nutritional Supplements (DHEA PO) Take 25 mg by mouth.     Pitavastatin Calcium (LIVALO) 4 MG TABS Take 1 tablet (4 mg total) by mouth daily. 90 tablet 1   tamsulosin (FLOMAX) 0.4 MG CAPS capsule TAKE 1 CAPSULE BY MOUTH EVERY DAY IN THE MORNING 90 capsule 0   thyroid (ARMOUR) 60 MG tablet Take 120 mg by mouth daily.     No  facility-administered medications prior to visit.     ROS Review of Systems  Constitutional: Negative for chills, diaphoresis, fatigue and fever.  HENT: Positive for postnasal drip, rhinorrhea and voice change. Negative for congestion, hearing loss, nosebleeds, sinus pressure, sneezing, sore throat and tinnitus.   Respiratory: Negative for cough, chest tightness, shortness of breath and wheezing.   Cardiovascular: Negative for chest pain, palpitations and leg swelling.  Gastrointestinal: Negative for abdominal pain, constipation, diarrhea, nausea and vomiting.  Endocrine: Negative for cold intolerance and heat intolerance.  Genitourinary: Negative.  Negative for difficulty urinating, discharge, dysuria, scrotal swelling, testicular pain and urgency.  Musculoskeletal: Positive for neck pain. Negative for arthralgias and myalgias.  Skin: Negative.   Neurological: Negative.  Negative for dizziness, weakness, numbness and headaches.  Hematological: Negative for adenopathy. Does not bruise/bleed easily.  Psychiatric/Behavioral: Negative.     Objective:  BP (!) 158/78 (BP Location: Left Arm, Patient Position: Sitting, Cuff Size: Large)    Pulse 60    Temp 98 F (36.7 C) (Oral)    Ht 6' (1.829 m)    Wt 213 lb (96.6 kg)    SpO2 94%    BMI 28.89 kg/m   BP Readings from Last 3 Encounters:  04/01/19 (!) 158/78  02/11/19 118/66  01/07/19 122/64    Wt Readings from Last 3 Encounters:  04/01/19 213 lb (96.6 kg)  02/11/19 213 lb 6.4 oz (96.8 kg)  01/07/19 211 lb (95.7 kg)    Physical Exam Vitals signs reviewed.  Constitutional:      Appearance: Normal appearance.  HENT:     Nose: Nose normal. No congestion or rhinorrhea.     Mouth/Throat:     Mouth: Mucous membranes are moist.     Pharynx: No oropharyngeal exudate.  Eyes:     General: No scleral icterus.    Conjunctiva/sclera: Conjunctivae normal.  Cardiovascular:     Rate and Rhythm: Normal rate and regular rhythm.     Heart  sounds: Murmur present. Systolic murmur present with a grade of 1/6. No diastolic murmur.  Pulmonary:     Effort: Pulmonary effort is normal.     Breath sounds: No stridor. No wheezing, rhonchi or rales.  Abdominal:     General: Abdomen is flat. Bowel sounds are normal. There is no distension.     Palpations: Abdomen is soft. There is no hepatomegaly or splenomegaly.     Tenderness: There is no abdominal tenderness.  Musculoskeletal:     Cervical back: He exhibits decreased range of motion. He exhibits no tenderness, no bony tenderness, no swelling and no edema.     Right lower leg: No edema.     Left lower leg: No edema.  Skin:    General: Skin is warm.  Neurological:     General: No focal deficit present.     Mental Status: He is alert and oriented to person, place, and time. Mental status is at baseline.  Psychiatric:        Mood and Affect: Mood normal.        Behavior: Behavior normal.        Thought Content: Thought content normal.        Judgment: Judgment normal.     Lab Results  Component Value Date   WBC 5.2 04/01/2019   HGB 16.5 04/01/2019   HCT 50.5 04/01/2019   PLT 315.0 04/01/2019   GLUCOSE 95 04/01/2019   CHOL 156 09/04/2018   TRIG 179 (H) 09/04/2018   HDL 38 (L) 09/04/2018   LDLCALC 82 09/04/2018   ALT 33 10/30/2018   AST 19 10/30/2018   NA 141 04/01/2019   K 4.4 04/01/2019   CL 106 04/01/2019   CREATININE 1.10 04/01/2019   BUN 14 04/01/2019   CO2 28 04/01/2019   TSH 0.91 04/01/2019   PSA 4.4 05/24/2018   INR 0.96 07/28/2014   HGBA1C 6.1 07/21/2017    Ct Angio Chest Pe W And/or Wo Contrast  Result Date: 10/22/2018 CLINICAL DATA:  Shortness of breath, chest tightness EXAM: CT ANGIOGRAPHY CHEST WITH CONTRAST TECHNIQUE: Multidetector CT imaging of the chest was performed using the standard protocol during bolus administration of intravenous contrast. Multiplanar CT image reconstructions and MIPs were obtained to evaluate the vascular anatomy. CONTRAST:   123mL OMNIPAQUE IOHEXOL 350 MG/ML SOLN COMPARISON:  None. FINDINGS: Cardiovascular: Satisfactory opacification of the pulmonary arteries to the segmental level. No evidence of pulmonary embolism. Cardiomegaly. Extensive 3 vessel coronary artery calcifications and/or stents. Left chest multi lead pacer. No pericardial effusion. Mediastinum/Nodes: No enlarged mediastinal, hilar, or axillary lymph nodes. Thyroid gland, trachea, and esophagus demonstrate no significant findings. Lungs/Pleura: Bibasilar scarring or atelectasis. No pleural effusion or pneumothorax. Upper Abdomen: No acute abnormality. Postoperative findings of splenectomy. Musculoskeletal:  No chest wall abnormality. No acute or significant osseous findings. Review of the MIP images confirms the above findings. IMPRESSION: 1.  Negative examination for pulmonary embolism. 2.  Bibasilar scarring or atelectasis. 3.  Cardiomegaly and coronary artery disease. Electronically Signed   By: Eddie Candle M.D.   On: 10/22/2018 16:14   Mr Thoracic Spine Wo Contrast  Result Date: 10/23/2018 CLINICAL DATA:  Initial evaluation for acute sepsis. EXAM: MRI THORACIC AND LUMBAR SPINE WITHOUT CONTRAST TECHNIQUE: Multiplanar and multiecho pulse sequences of the thoracic and lumbar spine were obtained without intravenous contrast. COMPARISON:  None available. FINDINGS: MRI THORACIC SPINE FINDINGS Alignment: Market dextroscoliosis of the midthoracic spine. Grade 1 anterolisthesis of T2 on T3, likely chronic and degenerative. Alignment otherwise normal with preservation of the normal thoracic kyphosis. Vertebrae: Vertebral body heights maintained without evidence for acute or chronic fracture. Few scattered chronic endplate Schmorl's nodes noted within the lower thoracic spine. Bone marrow signal intensity within normal limits. No discrete or worrisome osseous lesions. No evidence for osteomyelitis discitis or septic arthritis. Cord: Signal intensity within the thoracic  spinal cord is normal. Normal cord caliber and morphology. No epidural collections. Paraspinal and other soft tissues: Paraspinous soft tissues demonstrate no acute finding. Small layering right pleural effusion with associated atelectasis. Visualized visceral structures grossly within normal limits. Disc levels: T1-2: Bilateral facet hypertrophy, greater on the right. No significant stenosis. T2-3: Grade 1 anterolisthesis. Bilateral facet hypertrophy. No spinal stenosis. Mild right foraminal narrowing. T3-4:  Negative interspace.  Mild facet hypertrophy.  No stenosis. T4-5: Negative interspace. Right greater than left facet hypertrophy. No significant stenosis. T5-6:  Negative interspace.  Mild facet hypertrophy.  No stenosis. T6-7: Negative interspace. Mild facet and ligament flavum hypertrophy. No significant stenosis. T7-8:  Negative interspace.  Mild facet hypertrophy.  No stenosis. T8-9:  Mild facet hypertrophy.  No stenosis. T9-10: Bilateral facet hypertrophy with ankylosis. No significant stenosis. T10-11:  Posterior element hypertrophy.  No stenosis. T11-12:  Bilateral facet hypertrophy.  No stenosis. T12-L1:  Negative. 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 L5-S1 disc somewhat rudimentary. Alignment: Trace 2 mm anterolisthesis of L3 on L4. Mild levoscoliosis. Alignment otherwise normal with preservation of the normal lumbar lordosis. Vertebrae: Vertebral body heights maintained without evidence for acute or chronic fracture. Bone marrow signal intensity mildly heterogeneous but within normal limits. No worrisome osseous lesions. Minimal reactive marrow edema about the bilateral facets at L2-3 through L4-5, likely due to facet arthritis, greater on the right. No evidence for osteomyelitis or discitis. Partially visualized SI joints within normal limits. Conus medullaris and cauda equina: Conus extends to the L1 level.  Conus and cauda equina appear normal. Paraspinal and other soft tissues: Mild edema seen involving the right greater than left lower posterior paraspinous musculature (series 27, image 5), likely reactive in nature due to adjacent facet degeneration. Possible muscular strain and/or injury or acute myositis could also be considered. No loculated collections. Paraspinous soft tissues demonstrate no other acute finding. Approximate 1 cm right renal cyst noted. Atherosclerotic change noted within the intra-abdominal aorta. Visualized visceral structures otherwise unremarkable. Disc levels: L1-2:  Unremarkable. L2-3: Mild disc bulge with disc desiccation. Moderate right with mild left facet hypertrophy. Superimposed 14 mm synovial cyst noted at the lateral aspect of the right L2-3 facet (series 29, image 10). This exhibits no mass effect on the adjacent thecal sac. Thecal sac. Resultant mild canal with right lateral recess narrowing. Mild right L2 foraminal  stenosis. L3-4: Trace anterolisthesis. Mild disc bulge with disc desiccation. Moderate bilateral facet hypertrophy. Resultant mild canal with bilateral lateral recess stenosis, slightly worse on the right. Mild bilateral foraminal narrowing. L4-5: Disc desiccation with minimal annular bulge. Moderate facet hypertrophy. No significant stenosis. L5-S1: Transitional lumbosacral anatomy with rudimentary L5-S1 disc. Minimal endplate osteophytic spurring without significant disc bulge. Mild facet hypertrophy. No significant stenosis. IMPRESSION: MR THORACIC SPINE IMPRESSION 1. No evidence for acute infection or other abnormality within the thoracic spine. 2. Moderate thoracic dextroscoliosis with associated multilevel facet hypertrophy as above. No significant spinal stenosis. Mild right foraminal narrowing at T2-3. 3. Small layering right pleural effusion with associated atelectasis. MR LUMBAR SPINE IMPRESSION 1. Mild paraspinous edema involving the right greater than left  lower posterior paraspinous musculature, favored to be reactive in nature due to adjacent facet degeneration. Finding could result in right-sided lower back pain. Sequelae of muscular injury and/or strain or possibly myositis could also be considered. No discrete soft tissue collections. 2. No other evidence for acute infection within the lumbar spine. No evidence for osteomyelitis or discitis. No epidural collections. 3. Mild for age multilevel disc bulging with moderate facet hypertrophy at L2-3 through L4-5 as above. Resultant mild canal with bilateral lateral recess narrowing at L2-3 and L3-4. Electronically Signed   By: Jeannine Boga M.D.   On: 10/23/2018 19:23   Mr Lumbar Spine Wo Contrast  Result Date: 10/23/2018 CLINICAL DATA:  Initial evaluation for acute sepsis. EXAM: MRI THORACIC AND LUMBAR SPINE WITHOUT CONTRAST TECHNIQUE: Multiplanar and multiecho pulse sequences of the thoracic and lumbar spine were obtained without intravenous contrast. COMPARISON:  None available. FINDINGS: MRI THORACIC SPINE FINDINGS Alignment: Market dextroscoliosis of the midthoracic spine. Grade 1 anterolisthesis of T2 on T3, likely chronic and degenerative. Alignment otherwise normal with preservation of the normal thoracic kyphosis. Vertebrae: Vertebral body heights maintained without evidence for acute or chronic fracture. Few scattered chronic endplate Schmorl's nodes noted within the lower thoracic spine. Bone marrow signal intensity within normal limits. No discrete or worrisome osseous lesions. No evidence for osteomyelitis discitis or septic arthritis. Cord: Signal intensity within the thoracic spinal cord is normal. Normal cord caliber and morphology. No epidural collections. Paraspinal and other soft tissues: Paraspinous soft tissues demonstrate no acute finding. Small layering right pleural effusion with associated atelectasis. Visualized visceral structures grossly within normal limits. Disc levels: T1-2:  Bilateral facet hypertrophy, greater on the right. No significant stenosis. T2-3: Grade 1 anterolisthesis. Bilateral facet hypertrophy. No spinal stenosis. Mild right foraminal narrowing. T3-4:  Negative interspace.  Mild facet hypertrophy.  No stenosis. T4-5: Negative interspace. Right greater than left facet hypertrophy. No significant stenosis. T5-6:  Negative interspace.  Mild facet hypertrophy.  No stenosis. T6-7: Negative interspace. Mild facet and ligament flavum hypertrophy. No significant stenosis. T7-8:  Negative interspace.  Mild facet hypertrophy.  No stenosis. T8-9:  Mild facet hypertrophy.  No stenosis. T9-10: Bilateral facet hypertrophy with ankylosis. No significant stenosis. T10-11:  Posterior element hypertrophy.  No stenosis. T11-12:  Bilateral facet hypertrophy.  No stenosis. T12-L1:  Negative. 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 L5-S1 disc somewhat rudimentary. Alignment: Trace 2 mm anterolisthesis of L3 on L4. Mild levoscoliosis. Alignment otherwise normal with preservation of the normal lumbar lordosis. Vertebrae: Vertebral body heights maintained without evidence for acute or chronic fracture. Bone marrow signal intensity mildly heterogeneous but within normal limits. No worrisome osseous lesions. Minimal reactive marrow edema about the bilateral facets  at L2-3 through L4-5, likely due to facet arthritis, greater on the right. No evidence for osteomyelitis or discitis. Partially visualized SI joints within normal limits. Conus medullaris and cauda equina: Conus extends to the L1 level. Conus and cauda equina appear normal. Paraspinal and other soft tissues: Mild edema seen involving the right greater than left lower posterior paraspinous musculature (series 27, image 5), likely reactive in nature due to adjacent facet degeneration. Possible muscular strain and/or injury or acute myositis could also be  considered. No loculated collections. Paraspinous soft tissues demonstrate no other acute finding. Approximate 1 cm right renal cyst noted. Atherosclerotic change noted within the intra-abdominal aorta. Visualized visceral structures otherwise unremarkable. Disc levels: L1-2:  Unremarkable. L2-3: Mild disc bulge with disc desiccation. Moderate right with mild left facet hypertrophy. Superimposed 14 mm synovial cyst noted at the lateral aspect of the right L2-3 facet (series 29, image 10). This exhibits no mass effect on the adjacent thecal sac. Thecal sac. Resultant mild canal with right lateral recess narrowing. Mild right L2 foraminal stenosis. L3-4: Trace anterolisthesis. Mild disc bulge with disc desiccation. Moderate bilateral facet hypertrophy. Resultant mild canal with bilateral lateral recess stenosis, slightly worse on the right. Mild bilateral foraminal narrowing. L4-5: Disc desiccation with minimal annular bulge. Moderate facet hypertrophy. No significant stenosis. L5-S1: Transitional lumbosacral anatomy with rudimentary L5-S1 disc. Minimal endplate osteophytic spurring without significant disc bulge. Mild facet hypertrophy. No significant stenosis. IMPRESSION: MR THORACIC SPINE IMPRESSION 1. No evidence for acute infection or other abnormality within the thoracic spine. 2. Moderate thoracic dextroscoliosis with associated multilevel facet hypertrophy as above. No significant spinal stenosis. Mild right foraminal narrowing at T2-3. 3. Small layering right pleural effusion with associated atelectasis. MR LUMBAR SPINE IMPRESSION 1. Mild paraspinous edema involving the right greater than left lower posterior paraspinous musculature, favored to be reactive in nature due to adjacent facet degeneration. Finding could result in right-sided lower back pain. Sequelae of muscular injury and/or strain or possibly myositis could also be considered. No discrete soft tissue collections. 2. No other evidence for acute  infection within the lumbar spine. No evidence for osteomyelitis or discitis. No epidural collections. 3. Mild for age multilevel disc bulging with moderate facet hypertrophy at L2-3 through L4-5 as above. Resultant mild canal with bilateral lateral recess narrowing at L2-3 and L3-4. Electronically Signed   By: Jeannine Boga M.D.   On: 10/23/2018 19:23   Dg Chest Portable 1 View  Result Date: 10/22/2018 CLINICAL DATA:  Dyspnea and shortness of breath EXAM: PORTABLE CHEST 1 VIEW COMPARISON:  08/13/2018 FINDINGS: Cardiac shadow is enlarged but accentuated by the portable technique. Pacing device is noted. No focal infiltrate or sizable effusion is seen. No acute bony abnormality is noted. IMPRESSION: No acute abnormality noted. Electronically Signed   By: Inez Catalina M.D.   On: 10/22/2018 11:48    No results found.  Assessment & Plan:   Heraclio was seen today for hypertension, hypothyroidism and allergic rhinitis .  Diagnoses and all orders for this visit:  Essential hypertension, benign- His blood pressure is adequately well controlled. -     Basic metabolic panel; Future -     CBC with Differential/Platelet; Future  Chronic renal disease, stage 2, mildly decreased glomerular filtration rate (GFR) between 60-89 mL/min/1.73 square meter- His renal function is recently improved and is normal now. -     Basic metabolic panel; Future -     CBC with Differential/Platelet; Future  Acquired hypothyroidism- His TSH is in the  normal range.  I recommended he stay on the current dose of Armour Thyroid. -     TSH; Future -     thyroid (ARMOUR) 60 MG tablet; Take 2 tablets (120 mg total) by mouth daily.  Need for influenza vaccination -     Flu Vaccine QUAD High Dose(Fluad)  Seasonal allergic rhinitis due to pollen -     azelastine (ASTELIN) 0.1 % nasal spray; Place 2 sprays into both nostrils 2 (two) times daily. Use in each nostril as directed  Neck pain on right side- Plain films show  severe DDD with foraminal narrowing.  I recommended that he see a spine specialist. -     DG Cervical Spine Complete; Future  Degenerative disc disease, cervical -     Ambulatory referral to Pain Clinic  Other orders -     Zoster Vaccine Adjuvanted [facility]) injection; Inject 0.5 mLs into the muscle once for 1 dose.   I have changed Jeannie Fend. Worland's thyroid. I am also having him start on Zoster Vaccine Adjuvanted and azelastine. Additionally, I am having him maintain his acidophilus, Dexilant, Nutritional Supplements (DHEA PO), anastrozole, aspirin EC, ezetimibe, hydrALAZINE, Livalo, fluticasone, clopidogrel, neomycin-polymyxin-dexameth, acyclovir, tamsulosin, and liothyronine.  Meds ordered this encounter  Medications   Zoster Vaccine Adjuvanted [facility]) injection    Sig: Inject 0.5 mLs into the muscle once for 1 dose.    Dispense:  0.5 mL    Refill:  1   azelastine (ASTELIN) 0.1 % nasal spray    Sig: Place 2 sprays into both nostrils 2 (two) times daily. Use in each nostril as directed    Dispense:  90 mL    Refill:  3   thyroid (ARMOUR) 60 MG tablet    Sig: Take 2 tablets (120 mg total) by mouth daily.    Dispense:  180 tablet    Refill:  1     Follow-up: Return in about 6 months (around 09/30/2019).  Scarlette Calico, MD

## 2019-04-01 NOTE — Patient Instructions (Signed)
Hypothyroidism  Hypothyroidism is when the thyroid gland does not make enough of certain hormones (it is underactive). The thyroid gland is a small gland located in the lower front part of the neck, just in front of the windpipe (trachea). This gland makes hormones that help control how the body uses food for energy (metabolism) as well as how the heart and brain function. These hormones also play a role in keeping your bones strong. When the thyroid is underactive, it produces too little of the hormones thyroxine (T4) and triiodothyronine (T3). What are the causes? This condition may be caused by:  Hashimoto's disease. This is a disease in which the body's disease-fighting system (immune system) attacks the thyroid gland. This is the most common cause.  Viral infections.  Pregnancy.  Certain medicines.  Birth defects.  Past radiation treatments to the head or neck for cancer.  Past treatment with radioactive iodine.  Past exposure to radiation in the environment.  Past surgical removal of part or all of the thyroid.  Problems with a gland in the center of the brain (pituitary gland).  Lack of enough iodine in the diet. What increases the risk? You are more likely to develop this condition if:  You are male.  You have a family history of thyroid conditions.  You use a medicine called lithium.  You take medicines that affect the immune system (immunosuppressants). What are the signs or symptoms? Symptoms of this condition include:  Feeling as though you have no energy (lethargy).  Not being able to tolerate cold.  Weight gain that is not explained by a change in diet or exercise habits.  Lack of appetite.  Dry skin.  Coarse hair.  Menstrual irregularity.  Slowing of thought processes.  Constipation.  Sadness or depression. How is this diagnosed? This condition may be diagnosed based on:  Your symptoms, your medical history, and a physical exam.  Blood  tests. You may also have imaging tests, such as an ultrasound or MRI. How is this treated? This condition is treated with medicine that replaces the thyroid hormones that your body does not make. After you begin treatment, it may take several weeks for symptoms to go away. Follow these instructions at home:  Take over-the-counter and prescription medicines only as told by your health care provider.  If you start taking any new medicines, tell your health care provider.  Keep all follow-up visits as told by your health care provider. This is important. ? As your condition improves, your dosage of thyroid hormone medicine may change. ? You will need to have blood tests regularly so that your health care provider can monitor your condition. Contact a health care provider if:  Your symptoms do not get better with treatment.  You are taking thyroid replacement medicine and you: ? Sweat a lot. ? Have tremors. ? Feel anxious. ? Lose weight rapidly. ? Cannot tolerate heat. ? Have emotional swings. ? Have diarrhea. ? Feel weak. Get help right away if you have:  Chest pain.  An irregular heartbeat.  A rapid heartbeat.  Difficulty breathing. Summary  Hypothyroidism is when the thyroid gland does not make enough of certain hormones (it is underactive).  When the thyroid is underactive, it produces too little of the hormones thyroxine (T4) and triiodothyronine (T3).  The most common cause is Hashimoto's disease, a disease in which the body's disease-fighting system (immune system) attacks the thyroid gland. The condition can also be caused by viral infections, medicine, pregnancy, or past   radiation treatment to the head or neck.  Symptoms may include weight gain, dry skin, constipation, feeling as though you do not have energy, and not being able to tolerate cold.  This condition is treated with medicine to replace the thyroid hormones that your body does not make. This information  is not intended to replace advice given to you by your health care provider. Make sure you discuss any questions you have with your health care provider. Document Released: 05/22/2005 Document Revised: 05/04/2017 Document Reviewed: 05/02/2017 Elsevier Patient Education  2020 [employer]  

## 2019-04-03 ENCOUNTER — Encounter (HOSPITAL_COMMUNITY): Admission: RE | Payer: Self-pay | Source: Home / Self Care

## 2019-04-03 ENCOUNTER — Ambulatory Visit (HOSPITAL_COMMUNITY): Admission: RE | Admit: 2019-04-03 | Payer: Medicare Other | Source: Home / Self Care | Admitting: Ophthalmology

## 2019-04-03 DIAGNOSIS — H33001 Unspecified retinal detachment with retinal break, right eye: Secondary | ICD-10-CM | POA: Diagnosis not present

## 2019-04-03 DIAGNOSIS — I2510 Atherosclerotic heart disease of native coronary artery without angina pectoris: Secondary | ICD-10-CM | POA: Diagnosis not present

## 2019-04-03 DIAGNOSIS — I10 Essential (primary) hypertension: Secondary | ICD-10-CM | POA: Diagnosis not present

## 2019-04-03 DIAGNOSIS — H3341 Traction detachment of retina, right eye: Secondary | ICD-10-CM | POA: Diagnosis not present

## 2019-04-03 DIAGNOSIS — H35371 Puckering of macula, right eye: Secondary | ICD-10-CM | POA: Diagnosis not present

## 2019-04-03 DIAGNOSIS — H43391 Other vitreous opacities, right eye: Secondary | ICD-10-CM | POA: Diagnosis not present

## 2019-04-03 DIAGNOSIS — E785 Hyperlipidemia, unspecified: Secondary | ICD-10-CM | POA: Diagnosis not present

## 2019-04-03 LAB — TSH: TSH: 0.91 u[IU]/mL (ref 0.35–4.50)

## 2019-04-03 SURGERY — PARS PLANA VITRECTOMY 27 GAUGE
Anesthesia: General | Laterality: Right

## 2019-04-03 MED ORDER — THYROID 60 MG PO TABS: 120.0000 mg | ORAL_TABLET | Freq: Every day | ORAL | 1 refills | Status: DC

## 2019-04-03 NOTE — Assessment & Plan Note (Signed)
Exam completed Labs reviewed Vaccines reviewed and updated No cancer screenings are indicated Patient education was given 

## 2019-04-04 ENCOUNTER — Encounter (INDEPENDENT_AMBULATORY_CARE_PROVIDER_SITE_OTHER): Payer: Medicare Other | Admitting: Ophthalmology

## 2019-04-04 DIAGNOSIS — H3321 Serous retinal detachment, right eye: Secondary | ICD-10-CM | POA: Diagnosis not present

## 2019-04-04 DIAGNOSIS — H35371 Puckering of macula, right eye: Secondary | ICD-10-CM | POA: Diagnosis not present

## 2019-04-04 DIAGNOSIS — H43391 Other vitreous opacities, right eye: Secondary | ICD-10-CM | POA: Diagnosis not present

## 2019-04-04 DIAGNOSIS — Z48810 Encounter for surgical aftercare following surgery on the sense organs: Secondary | ICD-10-CM | POA: Diagnosis not present

## 2019-04-04 DIAGNOSIS — Z961 Presence of intraocular lens: Secondary | ICD-10-CM | POA: Diagnosis not present

## 2019-04-14 DIAGNOSIS — Z9842 Cataract extraction status, left eye: Secondary | ICD-10-CM | POA: Diagnosis not present

## 2019-04-14 DIAGNOSIS — Z48810 Encounter for surgical aftercare following surgery on the sense organs: Secondary | ICD-10-CM | POA: Diagnosis not present

## 2019-04-14 DIAGNOSIS — Z9841 Cataract extraction status, right eye: Secondary | ICD-10-CM | POA: Diagnosis not present

## 2019-04-14 DIAGNOSIS — Z9889 Other specified postprocedural states: Secondary | ICD-10-CM | POA: Diagnosis not present

## 2019-04-14 DIAGNOSIS — H35371 Puckering of macula, right eye: Secondary | ICD-10-CM | POA: Diagnosis not present

## 2019-04-14 DIAGNOSIS — H3321 Serous retinal detachment, right eye: Secondary | ICD-10-CM | POA: Diagnosis not present

## 2019-04-14 DIAGNOSIS — H43391 Other vitreous opacities, right eye: Secondary | ICD-10-CM | POA: Diagnosis not present

## 2019-04-14 DIAGNOSIS — Z961 Presence of intraocular lens: Secondary | ICD-10-CM | POA: Diagnosis not present

## 2019-04-16 ENCOUNTER — Telehealth: Payer: Self-pay

## 2019-04-16 NOTE — Telephone Encounter (Signed)
   Primary Cardiologist: Quay Burow, MD  Chart reviewed as part of pre-operative protocol coverage. Per previous recommendation by Dr. [name] 03/2019 and lack of interval change in cardiac history since that time, patient can hold plavix x7 days for upcoming epidural injection. He should restart plavix as soon as he is cleared to do so by Dr. Maryjean Ka.  I will route this recommendation to the requesting party via Epic fax function and remove from pre-op pool.  Please call with questions.  Abigail Butts, PA-C 04/16/2019, 2:17 PM

## 2019-04-16 NOTE — Telephone Encounter (Signed)
   [facility] Pre-operative Risk Assessment    Request for surgical clearance:  1. What type of surgery is being performed? EPIDURAL INJECTION   2. When is this surgery scheduled? 04-21-2019   3. What type of clearance is required (medical clearance vs. Pharmacy clearance to hold med vs. Both)? PHARMACY  4. Are there any medications that need to be held prior to surgery and how long? PLAVIX 7 DAYS PRIOR   5. Practice name and name of physician performing surgery? [facility]   6. What is your office phone number [phone]    7.   What is your office fax [phone]  8.   Anesthesia type (None, local, MAC, general) ? NOT LISTED   Waylan Rocher 04/16/2019, 2:05 PM  _________________________________________________________________   (provider comments below)

## 2019-04-18 DIAGNOSIS — Z6829 Body mass index (BMI) 29.0-29.9, adult: Secondary | ICD-10-CM | POA: Diagnosis not present

## 2019-04-18 DIAGNOSIS — I10 Essential (primary) hypertension: Secondary | ICD-10-CM | POA: Diagnosis not present

## 2019-04-18 DIAGNOSIS — M542 Cervicalgia: Secondary | ICD-10-CM | POA: Diagnosis not present

## 2019-04-21 DIAGNOSIS — H3321 Serous retinal detachment, right eye: Secondary | ICD-10-CM | POA: Diagnosis not present

## 2019-04-21 DIAGNOSIS — H3521 Other non-diabetic proliferative retinopathy, right eye: Secondary | ICD-10-CM | POA: Diagnosis not present

## 2019-04-21 DIAGNOSIS — Z9842 Cataract extraction status, left eye: Secondary | ICD-10-CM | POA: Diagnosis not present

## 2019-04-21 DIAGNOSIS — Z48810 Encounter for surgical aftercare following surgery on the sense organs: Secondary | ICD-10-CM | POA: Diagnosis not present

## 2019-04-21 DIAGNOSIS — Z961 Presence of intraocular lens: Secondary | ICD-10-CM | POA: Diagnosis not present

## 2019-04-21 DIAGNOSIS — Z9841 Cataract extraction status, right eye: Secondary | ICD-10-CM | POA: Diagnosis not present

## 2019-04-21 DIAGNOSIS — Z20828 Contact with and (suspected) exposure to other viral communicable diseases: Secondary | ICD-10-CM | POA: Diagnosis not present

## 2019-04-21 DIAGNOSIS — H35371 Puckering of macula, right eye: Secondary | ICD-10-CM | POA: Diagnosis not present

## 2019-04-21 DIAGNOSIS — Z01812 Encounter for preprocedural laboratory examination: Secondary | ICD-10-CM | POA: Diagnosis not present

## 2019-04-21 DIAGNOSIS — H43391 Other vitreous opacities, right eye: Secondary | ICD-10-CM | POA: Diagnosis not present

## 2019-04-22 ENCOUNTER — Other Ambulatory Visit: Payer: Self-pay | Admitting: Internal Medicine

## 2019-04-23 DIAGNOSIS — D0422 Carcinoma in situ of skin of left ear and external auricular canal: Secondary | ICD-10-CM | POA: Diagnosis not present

## 2019-04-23 DIAGNOSIS — L570 Actinic keratosis: Secondary | ICD-10-CM | POA: Diagnosis not present

## 2019-04-24 DIAGNOSIS — H3321 Serous retinal detachment, right eye: Secondary | ICD-10-CM | POA: Diagnosis not present

## 2019-04-24 DIAGNOSIS — H3341 Traction detachment of retina, right eye: Secondary | ICD-10-CM | POA: Diagnosis not present

## 2019-04-25 DIAGNOSIS — Z888 Allergy status to other drugs, medicaments and biological substances status: Secondary | ICD-10-CM | POA: Diagnosis not present

## 2019-04-25 DIAGNOSIS — Z48810 Encounter for surgical aftercare following surgery on the sense organs: Secondary | ICD-10-CM | POA: Diagnosis not present

## 2019-04-25 DIAGNOSIS — Z885 Allergy status to narcotic agent status: Secondary | ICD-10-CM | POA: Diagnosis not present

## 2019-04-25 DIAGNOSIS — H5704 Mydriasis: Secondary | ICD-10-CM | POA: Diagnosis not present

## 2019-04-25 DIAGNOSIS — Z881 Allergy status to other antibiotic agents status: Secondary | ICD-10-CM | POA: Diagnosis not present

## 2019-04-25 DIAGNOSIS — Z961 Presence of intraocular lens: Secondary | ICD-10-CM | POA: Diagnosis not present

## 2019-04-25 DIAGNOSIS — Z882 Allergy status to sulfonamides status: Secondary | ICD-10-CM | POA: Diagnosis not present

## 2019-04-25 DIAGNOSIS — Z79899 Other long term (current) drug therapy: Secondary | ICD-10-CM | POA: Diagnosis not present

## 2019-04-30 ENCOUNTER — Ambulatory Visit (INDEPENDENT_AMBULATORY_CARE_PROVIDER_SITE_OTHER): Payer: Medicare Other | Admitting: *Deleted

## 2019-04-30 DIAGNOSIS — Z950 Presence of cardiac pacemaker: Secondary | ICD-10-CM | POA: Diagnosis not present

## 2019-05-03 LAB — CUP PACEART REMOTE DEVICE CHECK
Battery Remaining Longevity: 171 mo
Battery Voltage: 3.17 V
Brady Statistic AP VP Percent: 0.6 %
Brady Statistic AP VS Percent: 62.71 %
Brady Statistic AS VP Percent: 0.22 %
Brady Statistic AS VS Percent: 36.47 %
Brady Statistic RA Percent Paced: 63.17 %
Brady Statistic RV Percent Paced: 0.82 %
Date Time Interrogation Session: 20201128000003
Implantable Lead Implant Date: 20200527
Implantable Lead Implant Date: 20200527
Implantable Lead Location: 753859
Implantable Lead Location: 753860
Implantable Lead Model: 5076
Implantable Lead Model: 5076
Implantable Pulse Generator Implant Date: 20200527
Lead Channel Impedance Value: 361 Ohm
Lead Channel Impedance Value: 475 Ohm
Lead Channel Impedance Value: 570 Ohm
Lead Channel Impedance Value: 722 Ohm
Lead Channel Pacing Threshold Amplitude: 0.625 V
Lead Channel Pacing Threshold Amplitude: 0.75 V
Lead Channel Pacing Threshold Pulse Width: 0.4 ms
Lead Channel Pacing Threshold Pulse Width: 0.4 ms
Lead Channel Sensing Intrinsic Amplitude: 5.75 mV
Lead Channel Sensing Intrinsic Amplitude: 5.75 mV
Lead Channel Sensing Intrinsic Amplitude: 9 mV
Lead Channel Sensing Intrinsic Amplitude: 9 mV
Lead Channel Setting Pacing Amplitude: 1.5 V
Lead Channel Setting Pacing Amplitude: 2.5 V
Lead Channel Setting Pacing Pulse Width: 0.4 ms
Lead Channel Setting Sensing Sensitivity: 2 mV

## 2019-05-05 DIAGNOSIS — Z79899 Other long term (current) drug therapy: Secondary | ICD-10-CM | POA: Diagnosis not present

## 2019-05-05 DIAGNOSIS — H3321 Serous retinal detachment, right eye: Secondary | ICD-10-CM | POA: Diagnosis not present

## 2019-05-05 DIAGNOSIS — H35371 Puckering of macula, right eye: Secondary | ICD-10-CM | POA: Diagnosis not present

## 2019-05-05 DIAGNOSIS — Z48810 Encounter for surgical aftercare following surgery on the sense organs: Secondary | ICD-10-CM | POA: Diagnosis not present

## 2019-05-05 DIAGNOSIS — Z961 Presence of intraocular lens: Secondary | ICD-10-CM | POA: Diagnosis not present

## 2019-05-05 DIAGNOSIS — H43391 Other vitreous opacities, right eye: Secondary | ICD-10-CM | POA: Diagnosis not present

## 2019-05-05 DIAGNOSIS — H3521 Other non-diabetic proliferative retinopathy, right eye: Secondary | ICD-10-CM | POA: Diagnosis not present

## 2019-05-14 DIAGNOSIS — L570 Actinic keratosis: Secondary | ICD-10-CM | POA: Diagnosis not present

## 2019-05-20 ENCOUNTER — Other Ambulatory Visit: Payer: Self-pay | Admitting: Internal Medicine

## 2019-05-21 ENCOUNTER — Other Ambulatory Visit: Payer: Self-pay | Admitting: Internal Medicine

## 2019-05-21 DIAGNOSIS — E291 Testicular hypofunction: Secondary | ICD-10-CM

## 2019-05-21 MED ORDER — ANASTROZOLE 1 MG PO TABS: 1.0000 mg | ORAL_TABLET | ORAL | 1 refills | Status: DC

## 2019-05-27 ENCOUNTER — Telehealth: Payer: Self-pay | Admitting: Internal Medicine

## 2019-06-03 ENCOUNTER — Other Ambulatory Visit: Payer: Self-pay | Admitting: Internal Medicine

## 2019-06-03 ENCOUNTER — Telehealth: Payer: Self-pay

## 2019-06-03 DIAGNOSIS — E291 Testicular hypofunction: Secondary | ICD-10-CM | POA: Insufficient documentation

## 2019-06-03 MED ORDER — TESTOSTERONE CYPIONATE 200 MG/ML IM SOLN: 200.0000 mg | INTRAMUSCULAR | 1 refills | Status: DC

## 2019-06-03 NOTE — Telephone Encounter (Signed)
RX sent  TJ 

## 2019-06-03 NOTE — Telephone Encounter (Signed)
Contacted pt and he stated that he wanted to start testosterone injections sent to [facility]. Please advise if these can be started.

## 2019-06-03 NOTE — Telephone Encounter (Signed)
Lvm for pt informing rx was sent. Also, to call us if we need to schedule nurse visit for injections or if pt wants to self inject (we will need to send in needles,etc to [facility]) to call and let us know.

## 2019-06-03 NOTE — Telephone Encounter (Signed)
Copied from [place] [id number]. Topic: General - Other >> Jun 02, 2019  5:11 PM Greggory Keen D wrote: Reason for CRM: pt called saying when he was in last time he discussed with Dr. Ronnald Ramp about needing a prescription for testosterone injections that he does once a week.  His last doctor prescribed this for him.  He uses [facility]  CB#  [phone]

## 2019-06-09 ENCOUNTER — Ambulatory Visit (INDEPENDENT_AMBULATORY_CARE_PROVIDER_SITE_OTHER): Payer: Medicare Other

## 2019-06-09 ENCOUNTER — Other Ambulatory Visit: Payer: Self-pay

## 2019-06-09 ENCOUNTER — Ambulatory Visit (INDEPENDENT_AMBULATORY_CARE_PROVIDER_SITE_OTHER): Payer: Medicare Other | Admitting: Internal Medicine

## 2019-06-09 ENCOUNTER — Encounter: Payer: Self-pay | Admitting: Internal Medicine

## 2019-06-09 VITALS — BP 160/70 | HR 62 | Temp 97.7°F | Resp 16 | Ht 72.0 in | Wt 209.0 lb

## 2019-06-09 DIAGNOSIS — E291 Testicular hypofunction: Secondary | ICD-10-CM | POA: Diagnosis not present

## 2019-06-09 DIAGNOSIS — R05 Cough: Secondary | ICD-10-CM

## 2019-06-09 DIAGNOSIS — J411 Mucopurulent chronic bronchitis: Secondary | ICD-10-CM | POA: Insufficient documentation

## 2019-06-09 DIAGNOSIS — J4540 Moderate persistent asthma, uncomplicated: Secondary | ICD-10-CM

## 2019-06-09 DIAGNOSIS — R059 Cough, unspecified: Secondary | ICD-10-CM

## 2019-06-09 DIAGNOSIS — R0609 Other forms of dyspnea: Secondary | ICD-10-CM | POA: Diagnosis not present

## 2019-06-09 HISTORY — DX: Moderate persistent asthma, uncomplicated: J45.40

## 2019-06-09 LAB — POCT EXHALED NITRIC OXIDE: FeNO level (ppb): 49

## 2019-06-09 MED ORDER — MONTELUKAST SODIUM 10 MG PO TABS: 10.0000 mg | ORAL_TABLET | Freq: Every day | ORAL | 1 refills | Status: DC

## 2019-06-09 MED ORDER — YUPELRI 175 MCG/3ML IN SOLN: 1.0000 | Freq: Every day | RESPIRATORY_TRACT | 5 refills | Status: DC

## 2019-06-09 MED ORDER — TESTOSTERONE CYPIONATE 200 MG/ML IM SOLN: 250.0000 mg | INTRAMUSCULAR | 1 refills | Status: DC

## 2019-06-09 MED ORDER — NANDROLONE DECANOATE 200 MG/ML IJ OIL: 100.0000 mg | TOPICAL_OIL | INTRAMUSCULAR | 1 refills | Status: DC

## 2019-06-09 MED ORDER — BUDESONIDE 0.5 MG/2ML IN SUSP: 0.5000 mg | Freq: Two times a day (BID) | RESPIRATORY_TRACT | 5 refills | Status: DC

## 2019-06-09 MED ORDER — BROVANA 15 MCG/2ML IN NEBU: 15.0000 ug | INHALATION_SOLUTION | Freq: Two times a day (BID) | RESPIRATORY_TRACT | 5 refills | Status: DC

## 2019-06-09 NOTE — Patient Instructions (Signed)
http://www.aaaai.org/conditions-and-treatments/asthma">  Asthma, Adult  Asthma is a long-term (chronic) condition that causes recurrent episodes in which the airways become tight and narrow. The airways are the passages that lead from the nose and mouth down into the lungs. Asthma episodes, also called asthma attacks, can cause coughing, wheezing, shortness of breath, and chest pain. The airways can also fill with mucus. During an attack, it can be difficult to breathe. Asthma attacks can range from minor to life threatening. Asthma cannot be cured, but medicines and lifestyle changes can help control it and treat acute attacks. What are the causes? This condition is believed to be caused by inherited (genetic) and environmental factors, but its exact cause is not known. There are many things that can bring on an asthma attack or make asthma symptoms worse (triggers). Asthma triggers are different for each person. Common triggers include:  Mold.  Dust.  Cigarette smoke.  Cockroaches.  Things that can cause allergy symptoms (allergens), such as animal dander or pollen from trees or grass.  Air pollutants such as household cleaners, wood smoke, smog, or chemical odors.  Cold air, weather changes, and winds (which increase molds and pollen in the air).  Strong emotional expressions such as crying or laughing hard.  Stress.  Certain medicines (such as aspirin) or types of medicines (such as beta-blockers).  Sulfites in foods and drinks. Foods and drinks that may contain sulfites include dried fruit, potato chips, and sparkling grape juice.  Infections or inflammatory conditions such as the flu, a cold, or inflammation of the nasal membranes (rhinitis).  Gastroesophageal reflux disease (GERD).  Exercise or strenuous activity. What are the signs or symptoms? Symptoms of this condition may occur right after asthma is triggered or many hours later. Symptoms include:  Wheezing. This can  sound like whistling when you breathe.  Excessive nighttime or early morning coughing.  Frequent or severe coughing with a common cold.  Chest tightness.  Shortness of breath.  Tiredness (fatigue) with minimal activity. How is this diagnosed? This condition is diagnosed based on:  Your medical history.  A physical exam.  Tests, which may include: ? Lung function studies and pulmonary studies (spirometry). These tests can evaluate the flow of air in your lungs. ? Allergy tests. ? Imaging tests, such as X-rays. How is this treated? There is no cure for this condition, but treatment can help control your symptoms. Treatment for asthma usually involves:  Identifying and avoiding your asthma triggers.  Using medicines to control your symptoms. Generally, two types of medicines are used to treat asthma: ? Controller medicines. These help prevent asthma symptoms from occurring. They are usually taken every day. ? Fast-acting reliever or rescue medicines. These quickly relieve asthma symptoms by widening the narrow and tight airways. They are used as needed and provide short-term relief.  Using supplemental oxygen. This may be needed during a severe episode.  Using other medicines, such as: ? Allergy medicines, such as antihistamines, if your asthma attacks are triggered by allergens. ? Immune medicines (immunomodulators). These are medicines that help control the immune system.  Creating an asthma action plan. An asthma action plan is a written plan for managing and treating your asthma attacks. This plan includes: ? A list of your asthma triggers and how to avoid them. ? Information about when medicines should be taken and when their dosage should be changed. ? Instructions about using a device called a peak flow meter. A peak flow meter measures how well the lungs are working   and the severity of your asthma. It helps you monitor your condition. Follow these instructions at  home: Controlling your home environment Control your home environment in the following ways to help avoid triggers and prevent asthma attacks:  Change your heating and air conditioning filter regularly.  Limit your use of fireplaces and wood stoves.  Get rid of pests (such as roaches and mice) and their droppings.  Throw away plants if you see mold on them.  Clean floors and dust surfaces regularly. Use unscented cleaning products.  Try to have someone else vacuum for you regularly. Stay out of rooms while they are being vacuumed and for a short while afterward. If you vacuum, use a dust mask from a hardware store, a double-layered or microfilter vacuum cleaner bag, or a vacuum cleaner with a HEPA filter.  Replace carpet with wood, tile, or vinyl flooring. Carpet can trap dander and dust.  Use allergy-proof pillows, mattress covers, and box spring covers.  Keep your bedroom a trigger-free room.  Avoid pets and keep windows closed when allergens are in the air.  Wash beddings every week in hot water and dry them in a dryer.  Use blankets that are made of polyester or cotton.  Clean bathrooms and kitchens with bleach. If possible, have someone repaint the walls in these rooms with mold-resistant paint. Stay out of the rooms that are being cleaned and painted.  Wash your hands often with soap and water. If soap and water are not available, use hand sanitizer.  Do not allow anyone to smoke in your home. General instructions  Take over-the-counter and prescription medicines only as told by your health care provider. ? Speak with your health care provider if you have questions about how or when to take the medicines. ? Make note if you are requiring more frequent dosages.  Do not use any products that contain nicotine or tobacco, such as cigarettes and e-cigarettes. If you need help quitting, ask your health care provider. Also, avoid being exposed to secondhand smoke.  Use a peak  flow meter as told by your health care provider. Record and keep track of the readings.  Understand and use the asthma action plan to help minimize, or stop an asthma attack, without needing to seek medical care.  Make sure you stay up to date on your yearly vaccinations as told by your health care provider. This may include vaccines for the flu and pneumonia.  Avoid outdoor activities when allergen counts are high and when air quality is low.  Wear a ski mask that covers your nose and mouth during outdoor winter activities. Exercise indoors on cold days if you can.  Warm up before exercising, and take time for a cool-down period after exercise.  Keep all follow-up visits as told by your health care provider. This is important. Where to find more information  For information about asthma, turn to the Centers for Disease Control and Prevention at www.cdc.gov/asthma/faqs.htm  For air quality information, turn to AirNow at https://airnow.gov/ Contact a health care provider if:  You have wheezing, shortness of breath, or a cough even while you are taking medicine to prevent attacks.  The mucus you cough up (sputum) is thicker than usual.  Your sputum changes from clear or white to yellow, green, gray, or bloody.  Your medicines are causing side effects, such as a rash, itching, swelling, or trouble breathing.  You need to use a reliever medicine more than 2-3 times a week.  Your peak   flow reading is still at 50-79% of your personal best after following your action plan for 1 hour.  You have a fever. Get help right away if:  You are getting worse and do not respond to treatment during an asthma attack.  You are short of breath when at rest or when doing very little physical activity.  You have difficulty eating, drinking, or talking.  You have chest pain or tightness.  You develop a fast heartbeat or palpitations.  You have a bluish color to your lips or fingernails.  You  are light-headed or dizzy, or you faint.  Your peak flow reading is less than 50% of your personal best.  You feel too tired to breathe normally. Summary  Asthma is a long-term (chronic) condition that causes recurrent episodes in which the airways become tight and narrow. These episodes can cause coughing, wheezing, shortness of breath, and chest pain.  Asthma cannot be cured, but medicines and lifestyle changes can help control it and treat acute attacks.  Make sure you understand how to avoid triggers and how and when to use your medicines.  Asthma attacks can range from minor to life threatening. Get help right away if you have an asthma attack and do not respond to treatment with your usual rescue medicines. This information is not intended to replace advice given to you by your health care provider. Make sure you discuss any questions you have with your health care provider. Document Revised: 07/25/2018 Document Reviewed: 06/26/2016 Elsevier Patient Education  2020 [employer].  

## 2019-06-09 NOTE — Progress Notes (Signed)
Subjective:  Patient ID: [name] Glass, male    DOB: 1940/08/24  Age: 79 y.o. MRN: [id number]  CC: Cough   HPI [name] Glass presents for f/up - He complains of a 1 year history of intermittent but persistent episodes of cough, wheezing, and shortness of breath.  The cough has at times been productive of yellow phlegm but recently the phlegm has been clear.  He denies hemoptysis or chest pain.  Outpatient Medications Prior to Visit  Medication Sig Dispense Refill  . acidophilus (RISAQUAD) CAPS capsule Take 1 capsule by mouth 2 (two) times daily.    [name] acyclovir (ZOVIRAX) 200 MG capsule TAKE 1 CAPSULE BY MOUTH THREE TIMES A DAY 270 capsule 0  . anastrozole (ARIMIDEX) 1 MG tablet Take 1 tablet (1 mg total) by mouth 2 (two) times a week. 24 tablet 1  . aspirin EC 81 MG tablet Take 1 tablet (81 mg total) by mouth daily. 90 tablet 3  . azelastine (ASTELIN) 0.1 % nasal spray Place 2 sprays into both nostrils 2 (two) times daily. Use in each nostril as directed 90 mL 3  . clopidogrel (PLAVIX) 75 MG tablet TAKE 1 TABLET BY MOUTH EVERY DAY 90 tablet 0  . ezetimibe (ZETIA) 10 MG tablet Take 1 tablet (10 mg total) by mouth daily. 90 tablet 1  . fluticasone (FLONASE) 50 MCG/ACT nasal spray Place 2 sprays into both nostrils daily. 48 mL 1  . folic acid (FOLVITE) 1 MG tablet TAKE 1 TABLET BY MOUTH EVERY DAY    . hydrALAZINE (APRESOLINE) 25 MG tablet TAKE 1 TABLET BY MOUTH 3 TIMES A DAY 270 tablet 1  . liothyronine (CYTOMEL) 25 MCG tablet TAKE 1 TABLET BY MOUTH EVERY DAY IN THE MORNING 90 tablet 1  . Nutritional Supplements (DHEA PO) Take 25 mg by mouth.    . Pitavastatin Calcium (LIVALO) 4 MG TABS Take 1 tablet (4 mg total) by mouth daily. 90 tablet 1  . Potassium Gluconate 2.5 MEQ TABS Take by mouth.    . prednisoLONE acetate (PRED FORTE) 1 % ophthalmic suspension Place 1 drop into the right eye 4 times daily.    . tamsulosin (FLOMAX) 0.4 MG CAPS capsule TAKE 1 CAPSULE BY MOUTH EVERY DAY IN THE  MORNING 90 capsule 1  . thyroid (ARMOUR) 60 MG tablet Take 2 tablets (120 mg total) by mouth daily. 180 tablet 1  . TREXALL 5 MG tablet Take 10 mg by mouth once a week.    . testosterone cypionate (DEPOTESTOSTERONE CYPIONATE) 200 MG/ML injection Inject 1 mL (200 mg total) into the muscle every 14 (fourteen) days. 10 mL 1  . DEXILANT 60 MG capsule Take 1 capsule by mouth daily.  1  . neomycin-polymyxin-dexameth (MAXITROL) 0.1 % OINT Place 1 application into the right eye 2 (two) times daily.     No facility-administered medications prior to visit.    ROS Review of Systems  Constitutional: Negative.  Negative for chills, diaphoresis, fatigue and fever.  HENT: Negative.  Negative for trouble swallowing.   Respiratory: Positive for cough, shortness of breath and wheezing. Negative for chest tightness.   Cardiovascular: Negative for chest pain, palpitations and leg swelling.  Gastrointestinal: Negative for abdominal pain, constipation, diarrhea, nausea and vomiting.  Endocrine: Negative.   Genitourinary: Negative.   Musculoskeletal: Negative.  Negative for arthralgias and myalgias.  Skin: Negative.   Neurological: Negative.  Negative for dizziness, weakness, light-headedness and headaches.  Hematological: Negative.   Psychiatric/Behavioral: Negative.  Objective:  BP (!) 160/70 (BP Location: Left Arm, Patient Position: Sitting, Cuff Size: Large)   Pulse 62   Temp 97.7 F (36.5 C) (Oral)   Resp 16   Ht 6' (1.829 m)   Wt 209 lb (94.8 kg)   SpO2 94%   BMI 28.35 kg/m   BP Readings from Last 3 Encounters:  06/09/19 (!) 160/70  04/01/19 (!) 158/78  02/11/19 118/66    Wt Readings from Last 3 Encounters:  06/09/19 209 lb (94.8 kg)  04/01/19 213 lb (96.6 kg)  02/11/19 213 lb 6.4 oz (96.8 kg)    Physical Exam Constitutional:      General: He is not in acute distress.    Appearance: Normal appearance. He is not ill-appearing, toxic-appearing or diaphoretic.  HENT:     Nose:  Nose normal.     Mouth/Throat:     Mouth: Mucous membranes are moist.  Eyes:     General: No scleral icterus.    Conjunctiva/sclera: Conjunctivae normal.  Cardiovascular:     Rate and Rhythm: Normal rate and regular rhythm.     Heart sounds: Murmur present. Systolic murmur present with a grade of 2/6. No diastolic murmur. No gallop.   Pulmonary:     Effort: Pulmonary effort is normal.     Breath sounds: Examination of the right-lower field reveals rhonchi. Examination of the left-lower field reveals rhonchi. Rhonchi present. No decreased breath sounds, wheezing or rales.  Abdominal:     General: Abdomen is flat. There is no distension.     Palpations: There is no hepatomegaly, splenomegaly or mass.     Tenderness: There is no abdominal tenderness.  Musculoskeletal:     Cervical back: Neck supple.     Right lower leg: No edema.     Left lower leg: No edema.  Lymphadenopathy:     Cervical: No cervical adenopathy.  Skin:    General: Skin is warm.     Coloration: Skin is not jaundiced.  Neurological:     General: No focal deficit present.     Mental Status: He is alert and oriented to person, place, and time.  Psychiatric:        Mood and Affect: Mood normal.        Behavior: Behavior normal.     Lab Results  Component Value Date   WBC 5.2 04/01/2019   HGB 16.5 04/01/2019   HCT 50.5 04/01/2019   PLT 315.0 04/01/2019   GLUCOSE 95 04/01/2019   CHOL 156 09/04/2018   TRIG 179 (H) 09/04/2018   HDL 38 (L) 09/04/2018   LDLCALC 82 09/04/2018   ALT 33 10/30/2018   AST 19 10/30/2018   NA 141 04/01/2019   K 4.4 04/01/2019   CL 106 04/01/2019   CREATININE 1.10 04/01/2019   BUN 14 04/01/2019   CO2 28 04/01/2019   TSH 0.91 04/01/2019   PSA 4.4 05/24/2018   INR 0.96 07/28/2014   HGBA1C 6.1 07/21/2017    DG Cervical Spine Complete  Result Date: 04/01/2019 CLINICAL DATA:  Right neck pain, chronic, worsening last 6 months. EXAM: CERVICAL SPINE - COMPLETE 4+ VIEW COMPARISON:   None. FINDINGS: Normal alignment. Prevertebral soft tissues are normal. Advanced diffuse degenerative facet disease bilaterally. Multilevel moderate to severe neural foraminal narrowing bilaterally, left greater than right. No fracture. IMPRESSION: Advanced degenerative facet disease bilaterally. Multilevel bilateral neural foraminal narrowing, left greater than right. No acute bony abnormality. Electronically Signed   By: Rolm Baptise M.D.   On:  04/01/2019 15:58   DG Chest 2 View  Result Date: 06/09/2019 CLINICAL DATA:  Intermittent dyspnea on exertion EXAM: CHEST - 2 VIEW COMPARISON:  CT chest of 10/22/2018 FINDINGS: Cardiomediastinal contours are stable with mild cardiac enlargement. Right-sided dual lead pacer device with power pack over right chest similar to prior study. Lungs are clear and hyperinflated as before. No pleural effusion. Visualized skeletal structures with signs of spinal degenerative change as before. IMPRESSION: 1. No active cardiopulmonary disease. 2. Stable mild cardiac enlargement. 3. Stable hyperinflation consistent with COPD. Electronically Signed   By: Zetta Bills M.D.   On: 06/09/2019 15:26     Assessment & Plan:   Keif was seen today for cough.  Diagnoses and all orders for this visit:  Cough- His chest x-ray is negative for mass or infiltrate.  His FeNO score is elevated.  We will treat him for chronic bronchitis with asthma. -     POCT EXHALED NITRIC OXIDE -     DG Chest 2 View; Future  Moderate persistent asthma without complication -     budesonide (PULMICORT) 0.5 MG/2ML nebulizer solution; Take 2 mLs (0.5 mg total) by nebulization 2 (two) times daily. -     Discontinue: montelukast (SINGULAIR) 10 MG tablet; Take 1 tablet (10 mg total) by mouth at bedtime. -     montelukast (SINGULAIR) 10 MG tablet; Take 1 tablet (10 mg total) by mouth at bedtime.  Chronic bronchitis, mucopurulent (HCC) -     arformoterol (BROVANA) 15 MCG/2ML NEBU; Take 2 mLs (15 mcg  total) by nebulization 2 (two) times daily. -     Revefenacin (YUPELRI) 175 MCG/3ML SOLN; Inhale 1 Act into the lungs daily.  Hypogonadism male -     testosterone cypionate (DEPOTESTOSTERONE CYPIONATE) 200 MG/ML injection; Inject 1.25 mLs (250 mg total) into the muscle every 7 (seven) days. -     Nandrolone Decanoate 200 MG/ML OIL; Inject 100 mg as directed once a week.   I have discontinued [name] Glass's Dexilant and neomycin-polymyxin-dexameth. I have also changed his testosterone cypionate. Additionally, I am having him start on budesonide, Brovana, Yupelri, and Nandrolone Decanoate. Lastly, I am having him maintain his acidophilus, Nutritional Supplements (DHEA PO), aspirin EC, ezetimibe, hydrALAZINE, Livalo, fluticasone, azelastine, thyroid, clopidogrel, liothyronine, tamsulosin, acyclovir, anastrozole, prednisoLONE acetate, Trexall, folic acid, Potassium Gluconate, and montelukast.  Meds ordered this encounter  Medications  . budesonide (PULMICORT) 0.5 MG/2ML nebulizer solution    Sig: Take 2 mLs (0.5 mg total) by nebulization 2 (two) times daily.    Dispense:  120 mL    Refill:  5  . DISCONTD: montelukast (SINGULAIR) 10 MG tablet    Sig: Take 1 tablet (10 mg total) by mouth at bedtime.    Dispense:  90 tablet    Refill:  1  . arformoterol (BROVANA) 15 MCG/2ML NEBU    Sig: Take 2 mLs (15 mcg total) by nebulization 2 (two) times daily.    Dispense:  120 mL    Refill:  5  . Revefenacin (YUPELRI) 175 MCG/3ML SOLN    Sig: Inhale 1 Act into the lungs daily.    Dispense:  90 mL    Refill:  5  . montelukast (SINGULAIR) 10 MG tablet    Sig: Take 1 tablet (10 mg total) by mouth at bedtime.    Dispense:  90 tablet    Refill:  1  . testosterone cypionate (DEPOTESTOSTERONE CYPIONATE) 200 MG/ML injection    Sig: Inject 1.25 mLs (250 mg total)  into the muscle every 7 (seven) days.    Dispense:  20 mL    Refill:  1  . Nandrolone Decanoate 200 MG/ML OIL    Sig: Inject 100 mg as  directed once a week.    Dispense:  10 mL    Refill:  1     Follow-up: Return in about 3 months (around 09/07/2019).  Scarlette Calico, MD

## 2019-06-10 ENCOUNTER — Other Ambulatory Visit: Payer: Self-pay | Admitting: Internal Medicine

## 2019-06-26 ENCOUNTER — Telehealth: Payer: Self-pay | Admitting: Internal Medicine

## 2019-06-26 NOTE — Telephone Encounter (Signed)
[facility] and was placed on hold. Then I was informed that a pharmacist would call me back.

## 2019-06-26 NOTE — Telephone Encounter (Signed)
     1. Name of Medication: Decanoate 200 MG/ML   2. How are you currently taking this medication (dosage and times per day)? n/a  3. Are you having a reaction (difficulty breathing--STAT)? no  4. What is your medication issue? Pharmacy requesting call back to confirm changing compound.  Please call using  Ref # [id number]

## 2019-06-26 NOTE — Telephone Encounter (Signed)
F/u  [facility] clarification    1. Which medications need to be refilled? (please list name of each medication and dose if known) Nandrolone Decanoate 200 MG/ML OIL   Can they do 5 ml with two vials & In grape seed oil

## 2019-06-27 NOTE — Telephone Encounter (Signed)
Pharmacy contacted and given okay to dispense in the 2 x 55mL bottles.

## 2019-06-28 ENCOUNTER — Other Ambulatory Visit: Payer: Self-pay | Admitting: Internal Medicine

## 2019-07-04 DIAGNOSIS — H3521 Other non-diabetic proliferative retinopathy, right eye: Secondary | ICD-10-CM | POA: Diagnosis not present

## 2019-07-04 DIAGNOSIS — Z961 Presence of intraocular lens: Secondary | ICD-10-CM | POA: Diagnosis not present

## 2019-07-04 DIAGNOSIS — H35371 Puckering of macula, right eye: Secondary | ICD-10-CM | POA: Diagnosis not present

## 2019-07-04 DIAGNOSIS — Z79899 Other long term (current) drug therapy: Secondary | ICD-10-CM | POA: Diagnosis not present

## 2019-07-04 DIAGNOSIS — H3321 Serous retinal detachment, right eye: Secondary | ICD-10-CM | POA: Diagnosis not present

## 2019-07-05 ENCOUNTER — Emergency Department (HOSPITAL_BASED_OUTPATIENT_CLINIC_OR_DEPARTMENT_OTHER)
Admission: EM | Admit: 2019-07-05 | Discharge: 2019-07-05 | Disposition: A | Discharge status: Home / Self Care | Payer: Medicare Other | Attending: Emergency Medicine | Admitting: Emergency Medicine

## 2019-07-05 ENCOUNTER — Other Ambulatory Visit: Payer: Self-pay

## 2019-07-05 ENCOUNTER — Encounter (HOSPITAL_BASED_OUTPATIENT_CLINIC_OR_DEPARTMENT_OTHER): Payer: Self-pay | Admitting: Emergency Medicine

## 2019-07-05 ENCOUNTER — Emergency Department (HOSPITAL_BASED_OUTPATIENT_CLINIC_OR_DEPARTMENT_OTHER): Payer: Medicare Other

## 2019-07-05 DIAGNOSIS — I259 Chronic ischemic heart disease, unspecified: Secondary | ICD-10-CM | POA: Diagnosis not present

## 2019-07-05 DIAGNOSIS — R310 Gross hematuria: Secondary | ICD-10-CM

## 2019-07-05 DIAGNOSIS — N400 Enlarged prostate without lower urinary tract symptoms: Secondary | ICD-10-CM | POA: Diagnosis not present

## 2019-07-05 DIAGNOSIS — E039 Hypothyroidism, unspecified: Secondary | ICD-10-CM | POA: Diagnosis not present

## 2019-07-05 DIAGNOSIS — R319 Hematuria, unspecified: Secondary | ICD-10-CM | POA: Diagnosis not present

## 2019-07-05 DIAGNOSIS — K4090 Unilateral inguinal hernia, without obstruction or gangrene, not specified as recurrent: Secondary | ICD-10-CM | POA: Diagnosis not present

## 2019-07-05 DIAGNOSIS — J45909 Unspecified asthma, uncomplicated: Secondary | ICD-10-CM | POA: Insufficient documentation

## 2019-07-05 DIAGNOSIS — R338 Other retention of urine: Secondary | ICD-10-CM

## 2019-07-05 DIAGNOSIS — Z7902 Long term (current) use of antithrombotics/antiplatelets: Secondary | ICD-10-CM | POA: Insufficient documentation

## 2019-07-05 DIAGNOSIS — N401 Enlarged prostate with lower urinary tract symptoms: Secondary | ICD-10-CM | POA: Insufficient documentation

## 2019-07-05 DIAGNOSIS — Z79899 Other long term (current) drug therapy: Secondary | ICD-10-CM | POA: Insufficient documentation

## 2019-07-05 DIAGNOSIS — R339 Retention of urine, unspecified: Secondary | ICD-10-CM | POA: Insufficient documentation

## 2019-07-05 DIAGNOSIS — Z7982 Long term (current) use of aspirin: Secondary | ICD-10-CM | POA: Insufficient documentation

## 2019-07-05 DIAGNOSIS — I10 Essential (primary) hypertension: Secondary | ICD-10-CM | POA: Diagnosis not present

## 2019-07-05 LAB — URINALYSIS, ROUTINE W REFLEX MICROSCOPIC
Bilirubin Urine: NEGATIVE
Glucose, UA: NEGATIVE mg/dL
Ketones, ur: 15 mg/dL — AB
Nitrite: POSITIVE — AB
Protein, ur: >300 mg/dL — AB
Specific Gravity, Urine: 1.015 (ref 1.005–1.030)
pH: 6.5 (ref 5.0–8.0)

## 2019-07-05 LAB — URINALYSIS, MICROSCOPIC (REFLEX)
Bacteria, UA: NONE SEEN
RBC / HPF: >50 RBC/hpf (ref 0–5)
Squamous Epithelial / HPF: NONE SEEN (ref 0–5)

## 2019-07-05 MED ORDER — CEPHALEXIN 250 MG PO CAPS
500.0000 mg | ORAL_CAPSULE | Freq: Once | ORAL | Status: AC
  Administered 2019-07-05: 500 mg via ORAL
  Filled 2019-07-05: qty 2

## 2019-07-05 MED ORDER — CEPHALEXIN 500 MG PO CAPS: 500.0000 mg | ORAL_CAPSULE | Freq: Four times a day (QID) | ORAL | 0 refills | Status: DC

## 2019-07-05 MED ORDER — LIDOCAINE HCL URETHRAL/MUCOSAL 2 % EX GEL
CUTANEOUS | Status: AC
  Administered 2019-07-05: 20
  Filled 2019-07-05: qty 20

## 2019-07-05 NOTE — ED Notes (Signed)
Transported to CT 

## 2019-07-05 NOTE — ED Triage Notes (Signed)
Pt states he has been having blood in his urine that started last night around 7pm and was passing clots  Pt states he has not been able to void since 2 am  Pt states he is currently taking Plavix, was started on it in May

## 2019-07-05 NOTE — ED Provider Notes (Signed)
[facility] Provider Note: [name] Spurling, MD, FACEP  CSN: [id number] MRN: [id number] ARRIVAL: 07/05/19 at [place]: [name] Glass  Hematuria and Urinary Retention   HISTORY OF PRESENT ILLNESS  07/05/19 5:08 AM [name] Glass is a 79 y.o. male who is been on Plavix since May of last year.  Evening about 7 PM he noted blood in his urine which has worsened to include the passage of clots.  Since 2 AM he has not been able to void at all.  He is now having 8 out of 10 pain in his bladder which he describes as pressure.  It is worse with movement or palpation.  He has not had a fever with this.  He has a history of hematuria when on Plavix in the past.   Past Medical History:  Diagnosis Date  . Arthritis   . Coronary artery disease   . GERD (gastroesophageal reflux disease)   . H/O bronchitis   . H/O hiatal hernia   . History of kidney stones last in 1976  . Hyperlipidemia    statin intolerant, under control  . Hypertension    under control  . Hypertensive retinopathy    OU  . Hypothyroidism   . Moderate persistent asthma without complication [id number]  . Retinal detachment    OD  . Sleep apnea    hx of, surgery to reverse    Past Surgical History:  Procedure Laterality Date  . CARDIAC CATHETERIZATION Right 2008   5 heart stents  . CATARACT EXTRACTION Bilateral 6 years ago  . CYSTOSCOPY N/A 08/06/2012   Procedure: CYSTOSCOPY;  Surgeon: [name], MD;  Location: [facility];  Service: Urology;  Laterality: N/A;  . ESOPHAGOGASTRIC FUNDOPLICATION  [id number]   spleen removed, 5 pints of blood given  . EYE SURGERY    . HERNIA REPAIR  1985   x4  . INGUINAL HERNIA REPAIR  1992  . PACEMAKER IMPLANT N/A 10/30/2018   Procedure: PACEMAKER IMPLANT;  Surgeon: Constance Haw, MD;  Location: [facility];  Service: Cardiovascular;  Laterality: N/A;  . PACEMAKER INSERTION    . PPM GENERATOR REMOVAL N/A 10/25/2018   Procedure: PPM GENERATOR REMOVAL;   Surgeon: Constance Haw, MD;  Location: [facility];  Service: Cardiovascular;  Laterality: N/A;  . PROSTATE ABLATION  2013   "laser ablation"  . RETINAL DETACHMENT SURGERY Right 01/24/2019   Pneumatic cryopexy - Dr. Bernarda Caffey  . TEE WITHOUT CARDIOVERSION N/A 10/25/2018   Procedure: TRANSESOPHAGEAL ECHOCARDIOGRAM (TEE);  Surgeon: Acie Fredrickson Wonda Cheng, MD;  Location: [facility];  Service: Cardiovascular;  Laterality: N/A;  . TEMPORARY PACEMAKER N/A 10/25/2018   Procedure: TEMPORARY PACEMAKER;  Surgeon: Constance Haw, MD;  Location: [place] [facility];  Service: Cardiovascular;  Laterality: N/A;  . TONSILLECTOMY  as child  . TOTAL KNEE ARTHROPLASTY Right 08/03/2014   Procedure: TOTAL RIGHT KNEE ARTHROPLASTY;  Surgeon: [name], MD;  Location: [facility];  Service: Orthopedics;  Laterality: Right;  . TRANSURETHRAL RESECTION OF PROSTATE N/A 08/06/2012   Procedure: [place] Free OF PROSTATE WITH GYRUS ;  Surgeon: [name], MD;  Location: [facility];  Service: Urology;  Laterality: N/A;  . UMBILICAL HERNIA REPAIR  1990  . UVULECTOMY  2005   for sleep apnea  . UVULOPALATOPHARYNGOPLASTY  6 years ago    Family History  Problem Relation Age of Onset  . Heart attack Father 43  lived to 104  . Alzheimer's disease Mother   . Stroke Mother   . Breast cancer Mother   . Alzheimer's disease Maternal Grandmother   . Stroke Maternal Grandmother   . Lung cancer Maternal Grandmother   . Lung cancer Maternal Grandfather   . Cancer Paternal Grandmother        type unknown  . Diabetes Paternal Grandfather   . Other Paternal Grandfather        Growth in the back of the head    Social History   Tobacco Use  . Smoking status: Never Smoker  . Smokeless tobacco: Never Used  Substance Use Topics  . Alcohol use: Yes    Alcohol/week: 4.0 standard drinks    Types: 4 Standard drinks or equivalent per week    Comment: 3 drinks at night   . Drug use: No    Prior to Admission  medications   Medication Sig Start Date End Date Taking? Authorizing Provider  acidophilus (RISAQUAD) CAPS capsule Take 1 capsule by mouth 2 (two) times daily.    [provider]  acyclovir (ZOVIRAX) 200 MG capsule TAKE 1 CAPSULE BY MOUTH THREE TIMES A DAY 05/21/19   [name], MD  anastrozole (ARIMIDEX) 1 MG tablet Take 1 tablet (1 mg total) by mouth 2 (two) times a week. 05/22/19   [name], MD  arformoterol (BROVANA) 15 MCG/2ML NEBU Take 2 mLs (15 mcg total) by nebulization 2 (two) times daily. 06/09/19   [name], MD  aspirin EC 81 MG tablet Take 1 tablet (81 mg total) by mouth daily. 07/30/17   [name], MD  azelastine (ASTELIN) 0.1 % nasal spray Place 2 sprays into both nostrils 2 (two) times daily. Use in each nostril as directed 04/01/19   [name], MD  budesonide (PULMICORT) 0.5 MG/2ML nebulizer solution Take 2 mLs (0.5 mg total) by nebulization 2 (two) times daily. 06/09/19   [name], MD  cephALEXin (KEFLEX) 500 MG capsule Take 1 capsule (500 mg total) by mouth 4 (four) times daily. 07/05/19   Shadana Pry, MD  clopidogrel (PLAVIX) 75 MG tablet TAKE 1 TABLET BY MOUTH EVERY DAY 04/22/19   [name], MD  ezetimibe (ZETIA) 10 MG tablet TAKE 1 TABLET BY MOUTH EVERY DAY 06/10/19   [name], MD  fluticasone (FLONASE) 50 MCG/ACT nasal spray Place 2 sprays into both nostrils daily. 01/15/19   [name], MD  folic acid (FOLVITE) 1 MG tablet TAKE 1 TABLET BY MOUTH EVERY DAY 05/23/19   [provider]  hydrALAZINE (APRESOLINE) 25 MG tablet TAKE 1 TABLET BY MOUTH THREE TIMES A DAY 06/28/19   [name], MD  liothyronine (CYTOMEL) 25 MCG tablet TAKE 1 TABLET BY MOUTH EVERY DAY IN THE MORNING 05/20/19   [name], MD  montelukast (SINGULAIR) 10 MG tablet Take 1 tablet (10 mg total) by mouth at bedtime. 06/09/19   [name], MD  Nandrolone Decanoate 200 MG/ML OIL Inject 100 mg as directed once a week. 06/09/19   [name], MD  Nutritional Supplements (DHEA PO) Take 25 mg by mouth.    [provider]  Pitavastatin Calcium (LIVALO) 4 MG TABS Take 1 tablet (4 mg total) by mouth daily. 01/08/19   [name], MD  Potassium Gluconate 2.5 MEQ TABS Take by mouth.    [provider]  prednisoLONE acetate (PRED FORTE) 1 % ophthalmic suspension Place 1 drop into  the right eye 4 times daily. 04/14/19   [provider]  Revefenacin (YUPELRI) 175 MCG/3ML SOLN Inhale 1 Act into the lungs daily. 06/09/19   [name], MD  tamsulosin (FLOMAX) 0.4 MG CAPS capsule TAKE 1 CAPSULE BY MOUTH EVERY DAY IN THE MORNING 05/20/19   [name], MD  testosterone cypionate (DEPOTESTOSTERONE CYPIONATE) 200 MG/ML injection Inject 1.25 mLs (250 mg total) into the muscle every 7 (seven) days. 06/09/19   [name], MD  thyroid (ARMOUR) 60 MG tablet Take 2 tablets (120 mg total) by mouth daily. 04/03/19   [name], MD  TREXALL 5 MG tablet Take 10 mg by mouth once a week. 05/26/19   [provider]  ezetimibe (ZETIA) 10 MG tablet Take 1 tablet (10 mg total) by mouth daily. 11/21/18   [name], MD  hydrALAZINE (APRESOLINE) 25 MG tablet TAKE 1 TABLET BY MOUTH 3 TIMES A DAY 01/02/19   [name], MD    Allergies Morphine and related, Bactrim [sulfamethoxazole-trimethoprim], and Statins   REVIEW OF SYSTEMS  Negative except as noted here or in the History of Present Illness.   PHYSICAL EXAMINATION  Initial Vital Signs Blood pressure (!) 193/106, pulse 71, temperature 97.6 F (36.4 C), temperature source Oral, resp. rate (!) 22, height 6' (1.829 m), weight 93 kg, SpO2 93 %.  Examination General: Well-developed, well-nourished male in no acute distress; appearance consistent with age of record HENT: normocephalic; atraumatic Eyes: Normal appearance Neck: supple Heart: regular rate and rhythm Lungs: clear to auscultation bilaterally Abdomen: soft; nondistended;  distended, tender bladder; bowel sounds present  GU: Mild bilateral CVA tenderness Extremities: No deformity; full range of motion; no edema Neurologic: Awake, alert and oriented; motor function intact in all extremities and symmetric; no facial droop Skin: Warm and dry Psychiatric: Anxious   RESULTS  Summary of this visit's results, reviewed and interpreted by myself:   EKG Interpretation  Date/Time:    Ventricular Rate:    PR Interval:    QRS Duration:   QT Interval:    QTC Calculation:   R Axis:     Text Interpretation:        Laboratory Studies: Results for orders placed or performed during the hospital encounter of 07/05/19 (from the past 24 hour(s))  Urinalysis, Routine w reflex microscopic     Status: Abnormal   Collection Time: 07/05/19  5:27 AM  Result Value Ref Range   Color, Urine RED (A) YELLOW   APPearance TURBID (A) CLEAR   Specific Gravity, Urine 1.015 1.005 - 1.030   pH 6.5 5.0 - 8.0   Glucose, UA NEGATIVE NEGATIVE mg/dL   Hgb urine dipstick LARGE (A) NEGATIVE   Bilirubin Urine NEGATIVE NEGATIVE   Ketones, ur 15 (A) NEGATIVE mg/dL   Protein, ur >300 (A) NEGATIVE mg/dL   Nitrite POSITIVE (A) NEGATIVE   Leukocytes,Ua MODERATE (A) NEGATIVE  Urinalysis, Microscopic (reflex)     Status: None   Collection Time: 07/05/19  5:27 AM  Result Value Ref Range   RBC / HPF >50 0 - 5 RBC/hpf   WBC, UA 0-5 0 - 5 WBC/hpf   Bacteria, UA NONE SEEN NONE SEEN   Squamous Epithelial / LPF NONE SEEN 0 - 5   Imaging Studies: CT Renal Stone Study  Result Date: 07/05/2019 CLINICAL DATA:  Patient with hematuria. EXAM: CT ABDOMEN AND PELVIS WITHOUT CONTRAST TECHNIQUE: Multidetector CT imaging of the abdomen and pelvis was performed following the standard protocol without  IV contrast. COMPARISON:  CT abdomen pelvis 02/15/2012 FINDINGS: Lower chest: Heart is enlarged. Coronary arterial vascular calcifications. Minimal scarring/atelectasis within the lower lobes bilaterally.  Hepatobiliary: Liver is normal in size and contour. Gallbladder is unremarkable. No intrahepatic or extrahepatic biliary ductal dilatation. 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 Adrenals/Urinary Tract: Normal adrenal glands. Kidneys are symmetric in size. No hydronephrosis. 2 mm stone within the midpole of the left kidney. Foley catheter is present within the urinary bladder which is decompressed. There is a 5 mm stone adjacent to the Foley catheter at the level of the prosthetic urethra (image 66; series 4). There is a small amount of high attenuation within the inferior right aspect of the prostate. Stomach/Bowel: Normal morphology of the stomach. No evidence for bowel obstruction. No free fluid or free intraperitoneal air. Descending and sigmoid colonic diverticulosis without CT evidence for acute diverticulitis. Normal appendix. Vascular/Lymphatic: Infrarenal abdominal aortic ectasia. Peripheral calcification involving the abdominal aorta. No retroperitoneal lymphadenopathy. Reproductive: Enlarged prostate. Small amount of high attenuation within the posteroinferior right aspect of the prostate. Other: Fat containing left inguinal hernia. Additionally, there is a small amount of large bowel within the central aspect of the hernia without evidence for obstruction. Musculoskeletal: No aggressive or acute appearing osseous lesions. Lower thoracic and lumbar spine degenerative changes. IMPRESSION: There is a 5 mm calcification adjacent to the Foley catheter within the level of the prosthetic urethra, potentially representing a small stone. Small amount of high attenuation within the inferior right aspect of the prostate which may represent blood products or potentially calcification. Fat and large bowel containing left inguinal hernia without evidence for obstruction. Electronically Signed   By: Lovey Newcomer M.D.   On: 07/05/2019 06:10    ED  COURSE and MDM  Nursing notes, initial and subsequent vitals signs, including pulse oximetry, reviewed and interpreted by myself.  Vitals:   07/05/19 0505  BP: (!) 193/106  Pulse: 71  Resp: (!) 22  Temp: 97.6 F (36.4 C)  TempSrc: Oral  SpO2: 93%  Weight: 93 kg  Height: 6' (1.829 m)   Medications  cephALEXin (KEFLEX) capsule 500 mg (has no administration in time range)  lidocaine (XYLOCAINE) 2 % jelly (20 application  Given [id number])   5:27 AM Foley catheter placed by nursing staff without difficulty.  About 800 mL of grossly bloody urine obtained.  Patient experienced significant relief.  6:21 AM The cause for the patient's bleeding is unclear.  It may be related to the periurethral prosthetic stone seen or it could be due to infection.  We will start him on an antibiotic pending urine culture.  We will refer to urology for further evaluation.  PROCEDURES  Procedures   ED DIAGNOSES     ICD-10-CM   1. Acute urinary retention  R33.8   2. Gross hematuria  R31.0   3. 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 Left inguinal hernia  K40.90        Auna Mikkelsen, Jenny Reichmann, MD 07/05/19 [phone]

## 2019-07-06 LAB — URINE CULTURE: Culture: NO GROWTH

## 2019-07-07 ENCOUNTER — Telehealth: Payer: Self-pay | Admitting: Internal Medicine

## 2019-07-07 NOTE — Telephone Encounter (Signed)
Per PCP he agrees to take out catheter.

## 2019-07-07 NOTE — Telephone Encounter (Signed)
Patient had a cathter placed over the weekend. The ER informed him he should follow up with his provider to have it removed. Please advise if this is something you all will do.

## 2019-07-08 ENCOUNTER — Ambulatory Visit: Payer: Medicare Other | Admitting: Internal Medicine

## 2019-07-08 DIAGNOSIS — N5201 Erectile dysfunction due to arterial insufficiency: Secondary | ICD-10-CM | POA: Diagnosis not present

## 2019-07-08 DIAGNOSIS — N401 Enlarged prostate with lower urinary tract symptoms: Secondary | ICD-10-CM | POA: Diagnosis not present

## 2019-07-08 DIAGNOSIS — R310 Gross hematuria: Secondary | ICD-10-CM | POA: Diagnosis not present

## 2019-07-08 DIAGNOSIS — R3912 Poor urinary stream: Secondary | ICD-10-CM | POA: Diagnosis not present

## 2019-07-09 ENCOUNTER — Ambulatory Visit (INDEPENDENT_AMBULATORY_CARE_PROVIDER_SITE_OTHER): Payer: Medicare Other | Admitting: Cardiovascular Disease

## 2019-07-09 ENCOUNTER — Encounter: Payer: Self-pay | Admitting: Cardiovascular Disease

## 2019-07-09 ENCOUNTER — Other Ambulatory Visit: Payer: Self-pay

## 2019-07-09 DIAGNOSIS — I10 Essential (primary) hypertension: Secondary | ICD-10-CM

## 2019-07-09 DIAGNOSIS — Z950 Presence of cardiac pacemaker: Secondary | ICD-10-CM | POA: Diagnosis not present

## 2019-07-09 DIAGNOSIS — I2510 Atherosclerotic heart disease of native coronary artery without angina pectoris: Secondary | ICD-10-CM | POA: Diagnosis not present

## 2019-07-09 DIAGNOSIS — E785 Hyperlipidemia, unspecified: Secondary | ICD-10-CM

## 2019-07-09 NOTE — Assessment & Plan Note (Signed)
History of hyperlipidemia on Zetia and Livalo with lipid profile performed 09/04/2018 revealing took less than 156, LDL of 82 and HDL 38.

## 2019-07-09 NOTE — Patient Instructions (Signed)
Medication Instructions:  Your physician recommends that you continue on your current medications as directed. Please refer to the Current Medication list given to you today.  If you need a refill on your cardiac medications before your next appointment, please call your pharmacy.   Lab work: NONE  Testing/Procedures: NONE  Follow-Up: At [facility], you and your health needs are our priority.  As part of our continuing mission to provide you with exceptional heart care, we have created designated Provider Care Teams.  These Care Teams include your primary Cardiologist (physician) and Advanced Practice Providers (APPs -  Physician Assistants and Nurse Practitioners) who all work together to provide you with the care you need, when you need it. You may see Quay Burow, MD or one of the following Advanced Practice Providers on your designated Care Team:    Kerin Ransom, PA-C  [place], [place]  Coletta Memos, [place]  Your physician wants you to follow-up in: 1 year with Dr. Gwenlyn Glass

## 2019-07-09 NOTE — Progress Notes (Signed)
07/09/2019 [name] Glass   1941/01/22  [id number]  Primary Physician [name], MD Primary Cardiologist: [name] MD FACP, [place], [place], [place]  HPI:  [name] Glass is a 79 y.o.  moderately overweight although fit-appearing divorced Caucasian male with no children who I last saw  06/24/2018. He has a history of CAD status post circumflex and RCA stenting by myself 06/09/04.His other cardiovascular problemsinclude hypertension, hyperlipidemia and statin intolerance. He does drink Pinch blended scotch on a daily basis. He denies chest pain or shortness of breath. 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 He had a Myoview stress test performed 07/16/14 for preoperative clearance prior to a right total knee replacement which was performed by Dr. Ricki Rodriguez 08/03/14. This was low risk and nonischemic. Since I saw him last he was down in [place] at the [employer] and developed some throat pain. This led to an extensive workup in the hospital including fairly normal 2-D echo, a Myoview stress test. He ruled out for myocardial infarction. He scheduled to have cosmetic surgery on his eyelids and eyebrow 09/27/17 when she is cleared for given his recent negative stress test. He also recently got engaged and and married this past June.  He went on a 15-day cruise to the [place].  He was in [place] last year snowmobiling and had an episode of witnessed syncope.  He ultimately was transported by EMS to [facility] where he underwent cardiac catheterization by Dr. Earlie Counts radially revealing what he describes as patent stents in his circumflex and RCA which I implanted back in 2006 with high-grade calcified LAD stenosis.  He underwent staged atherectomy and drug-eluting stent implantation.  He also was noted to have long pauses on telemetry with recurrent syncope and and had a temporary transvenous  pacemaker placed followed by permanent pacemaker implantation.   Since I saw him a year ago he did transition from Brilinta to Plavix because of dyspnea.  He was hospitalized in May for sepsis.  He tested negative for COVID-19.  He did have a transesophageal echo that did not show evidence of SBE.  He developed a pocket infection and ended up having his pacemaker generator and leads were placed by Dr. Curt Bears 10/30/2018.  He has recently developed significant hematuria and has stopped his Plavix.   Current Meds  Medication Sig  . acidophilus (RISAQUAD) CAPS capsule Take 1 capsule by mouth 2 (two) times daily.  [name] acyclovir (ZOVIRAX) 200 MG capsule TAKE 1 CAPSULE BY MOUTH THREE TIMES A DAY  . anastrozole (ARIMIDEX) 1 MG tablet Take 1 tablet (1 mg total) by mouth 2 (two) times a week.  [name] arformoterol (BROVANA) 15 MCG/2ML NEBU Take 2 mLs (15 mcg total) by nebulization 2 (two) times daily.  [name] aspirin EC 81 MG tablet Take 1 tablet (81 mg total) by mouth daily.  [name] azelastine (ASTELIN) 0.1 % nasal spray Place 2 sprays into both nostrils 2 (two) times daily. Use in each nostril as directed  . budesonide (PULMICORT) 0.5 MG/2ML nebulizer solution Take 2 mLs (0.5 mg total) by nebulization 2 (two) times daily.  . cephALEXin (KEFLEX) 500 MG capsule Take 1 capsule (500 mg total) by mouth 4 (four) times daily.  . clopidogrel (PLAVIX) 75 MG tablet TAKE 1 TABLET BY MOUTH EVERY DAY  . ezetimibe (ZETIA) 10 MG tablet TAKE 1 TABLET BY MOUTH EVERY DAY  . fluticasone (FLONASE) 50 MCG/ACT  nasal spray Place 2 sprays into both nostrils daily.  . folic acid (FOLVITE) 1 MG tablet TAKE 1 TABLET BY MOUTH EVERY DAY  . hydrALAZINE (APRESOLINE) 25 MG tablet TAKE 1 TABLET BY MOUTH THREE TIMES A DAY  . liothyronine (CYTOMEL) 25 MCG tablet TAKE 1 TABLET BY MOUTH EVERY DAY IN THE MORNING  . montelukast (SINGULAIR) 10 MG tablet Take 1 tablet (10 mg total) by mouth at bedtime.  . Nandrolone Decanoate 200 MG/ML OIL Inject 100 mg as  directed once a week.  . Nutritional Supplements (DHEA PO) Take 25 mg by mouth.  . Pitavastatin Calcium (LIVALO) 4 MG TABS Take 1 tablet (4 mg total) by mouth daily.  . Potassium Gluconate 2.5 MEQ TABS Take by mouth.  . prednisoLONE acetate (PRED FORTE) 1 % ophthalmic suspension Place 1 drop into the right eye 4 times daily.  . Revefenacin (YUPELRI) 175 MCG/3ML SOLN Inhale 1 Act into the lungs daily.  . tamsulosin (FLOMAX) 0.4 MG CAPS capsule TAKE 1 CAPSULE BY MOUTH EVERY DAY IN THE MORNING  . testosterone cypionate (DEPOTESTOSTERONE CYPIONATE) 200 MG/ML injection Inject 1.25 mLs (250 mg total) into the muscle every 7 (seven) days.  [name] thyroid (ARMOUR) 60 MG tablet Take 2 tablets (120 mg total) by mouth daily.  [name] TREXALL 5 MG tablet Take 10 mg by mouth once a week.     Allergies  Allergen Reactions  . Morphine And Related     Had "crazy dreams" felt "crazy" per pt.  . Bactrim [Sulfamethoxazole-Trimethoprim]   . Statins     Social History   Socioeconomic History  . Marital status: Divorced    Spouse name: Not on file  . Number of children: 0  . Years of education: Not on file  . Highest education level: Not on file  Occupational History  . Occupation: Retired   Tobacco Use  . Smoking status: Never Smoker  . Smokeless tobacco: Never Used  Substance and Sexual Activity  . Alcohol use: Yes    Alcohol/week: 4.0 standard drinks    Types: 4 Standard drinks or equivalent per week    Comment: 3 drinks at night   . Drug use: No  . Sexual activity: Not on file  Other Topics Concern  . Not on file  Social History Narrative  . Not on file   Social Determinants of Health   Financial Resource Strain:   . Difficulty of Paying Living Expenses: Not on file  Food Insecurity:   . Worried About Charity fundraiser in the Last Year: Not on file  . Ran Out of Food in the Last Year: Not on file  Transportation Needs:   . Lack of Transportation (Medical): Not on file  . Lack of  Transportation (Non-Medical): Not on file  Physical Activity:   . Days of Exercise per Week: Not on file  . Minutes of Exercise per Session: Not on file  Stress:   . Feeling of Stress : Not on file  Social Connections:   . Frequency of Communication with Friends and Family: Not on file  . Frequency of Social Gatherings with Friends and Family: Not on file  . Attends Religious Services: Not on file  . Active Member of Clubs or Organizations: Not on file  . Attends Archivist Meetings: Not on file  . Marital Status: Not on file  Intimate Partner Violence:   . Fear of Current or Ex-Partner: Not on file  . Emotionally Abused: Not on file  .  Physically Abused: Not on file  . Sexually Abused: Not on file     Review of Systems: General: negative for chills, fever, night sweats or weight changes.  Cardiovascular: negative for chest pain, dyspnea on exertion, edema, orthopnea, palpitations, paroxysmal nocturnal dyspnea or shortness of breath Dermatological: negative for rash Respiratory: negative for cough or wheezing Urologic: negative for hematuria Abdominal: negative for nausea, vomiting, diarrhea, bright red blood per rectum, melena, or hematemesis Neurologic: negative for visual changes, syncope, or dizziness All other systems reviewed and are otherwise negative except as noted above.    Blood pressure 138/74, pulse 60, temperature (!) 96.8 F (36 C), height 6' (1.829 m), weight 208 lb 6.4 oz (94.5 kg), SpO2 94 %.  General appearance: alert and no distress Neck: no adenopathy, no carotid bruit, no JVD, supple, symmetrical, trachea midline and thyroid not enlarged, symmetric, no tenderness/mass/nodules Lungs: clear to auscultation bilaterally Heart: regular rate and rhythm, S1, S2 normal, no murmur, click, rub or gallop Extremities: extremities normal, atraumatic, no cyanosis or edema Pulses: 2+ and symmetric Skin: Skin color, texture, turgor normal. No rashes or  lesions Neurologic: Alert and oriented X 3, normal strength and tone. Normal symmetric reflexes. Normal coordination and gait  EKG not performed today  ASSESSMENT AND PLAN:   Coronary artery disease History of CAD status post circumflex and RCA stenting by myself 06/09/2004.  He had a negative stress test 07/16/2014 for preoperative clearance before right total knee replacement which was low risk and nonischemic.  He had a heart cath while snowmobiling in [facility] by Dr. Earlie Counts performed radially revealing patent stents) but high-grade calcified proximal LAD stenosis and he underwent staged atherectomy and drug-eluting stent implantation.  Is placed on aspirin and Brilinta.  The Brilinta was ultimately discontinued because of dyspnea and transition to Plavix.  Because of severe hematuria his Plavix was recently discontinued as well 1 years from his prior intervention.  Essential hypertension, benign History of essential hypertension with blood pressure measured today 138/74.  He is on hydralazine but no other antihypertensive medications.  Hyperlipidemia with target LDL less than 70 History of hyperlipidemia on Zetia and Livalo with lipid profile performed 09/04/2018 revealing took less than 156, LDL of 82 and HDL 38.  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  Because of a pocket infection in the setting of sepsis he had his generator and leads were placed by Dr. Curt Bears 10/30/2018.      [name] MD FACP,FACC,FAHA, [facility] 07/09/2019 11:19 AM

## 2019-07-09 NOTE — Assessment & Plan Note (Signed)
History of permanent transit get venous pacemaker implantation in [place] at the time of his last cardiac stent.  Because of a pocket infection in the setting of sepsis he had his generator and leads were placed by Dr. Curt Bears 10/30/2018.

## 2019-07-09 NOTE — Assessment & Plan Note (Signed)
History of essential hypertension with blood pressure measured today 138/74.  He is on hydralazine but no other antihypertensive medications.

## 2019-07-09 NOTE — Assessment & Plan Note (Signed)
History of CAD status post circumflex and RCA stenting by myself 06/09/2004.  He had a negative stress test 07/16/2014 for preoperative clearance before right total knee replacement which was low risk and nonischemic.  He had a heart cath while snowmobiling in [facility] by Dr. Earlie Counts performed radially revealing patent stents) but high-grade calcified proximal LAD stenosis and he underwent staged atherectomy and drug-eluting stent implantation.  Is placed on aspirin and Brilinta.  The Brilinta was ultimately discontinued because of dyspnea and transition to Plavix.  Because of severe hematuria his Plavix was recently discontinued as well 1 years from his prior intervention.

## 2019-07-13 ENCOUNTER — Other Ambulatory Visit: Payer: Self-pay | Admitting: Internal Medicine

## 2019-07-13 DIAGNOSIS — E785 Hyperlipidemia, unspecified: Secondary | ICD-10-CM

## 2019-07-13 DIAGNOSIS — I2510 Atherosclerotic heart disease of native coronary artery without angina pectoris: Secondary | ICD-10-CM

## 2019-07-19 ENCOUNTER — Other Ambulatory Visit: Payer: Self-pay | Admitting: Internal Medicine

## 2019-07-23 ENCOUNTER — Other Ambulatory Visit: Payer: Self-pay | Admitting: Internal Medicine

## 2019-07-24 ENCOUNTER — Other Ambulatory Visit: Payer: Self-pay | Admitting: Internal Medicine

## 2019-07-30 ENCOUNTER — Ambulatory Visit (INDEPENDENT_AMBULATORY_CARE_PROVIDER_SITE_OTHER): Payer: Medicare Other | Admitting: *Deleted

## 2019-07-30 DIAGNOSIS — Z950 Presence of cardiac pacemaker: Secondary | ICD-10-CM

## 2019-07-30 LAB — CUP PACEART REMOTE DEVICE CHECK
Battery Remaining Longevity: 167 mo
Battery Voltage: 3.15 V
Brady Statistic AP VP Percent: 0.49 %
Brady Statistic AP VS Percent: 35.12 %
Brady Statistic AS VP Percent: 0.27 %
Brady Statistic AS VS Percent: 64.12 %
Brady Statistic RA Percent Paced: 35.48 %
Brady Statistic RV Percent Paced: 0.76 %
Date Time Interrogation Session: 20210223201547
Implantable Lead Implant Date: 20200527
Implantable Lead Implant Date: 20200527
Implantable Lead Location: 753859
Implantable Lead Location: 753860
Implantable Lead Model: 5076
Implantable Lead Model: 5076
Implantable Pulse Generator Implant Date: 20200527
Lead Channel Impedance Value: 342 Ohm
Lead Channel Impedance Value: 418 Ohm
Lead Channel Impedance Value: 475 Ohm
Lead Channel Impedance Value: 646 Ohm
Lead Channel Pacing Threshold Amplitude: 0.5 V
Lead Channel Pacing Threshold Amplitude: 0.625 V
Lead Channel Pacing Threshold Pulse Width: 0.4 ms
Lead Channel Pacing Threshold Pulse Width: 0.4 ms
Lead Channel Sensing Intrinsic Amplitude: 5.375 mV
Lead Channel Sensing Intrinsic Amplitude: 5.375 mV
Lead Channel Sensing Intrinsic Amplitude: 8.5 mV
Lead Channel Sensing Intrinsic Amplitude: 8.5 mV
Lead Channel Setting Pacing Amplitude: 1.5 V
Lead Channel Setting Pacing Amplitude: 2.5 V
Lead Channel Setting Pacing Pulse Width: 0.4 ms
Lead Channel Setting Sensing Sensitivity: 2 mV

## 2019-07-31 NOTE — Progress Notes (Signed)
PPM Remote  

## 2019-08-06 MED ORDER — "SYRINGE/NEEDLE (DISP) 23G X 1"" 3 ML MISC": 0 refills | Status: DC

## 2019-08-06 NOTE — Addendum Note (Signed)
Addended by: Aviva Signs M on: 08/06/2019 11:11 AM   Modules accepted: Orders

## 2019-08-12 ENCOUNTER — Other Ambulatory Visit: Payer: Self-pay | Admitting: Internal Medicine

## 2019-09-01 DIAGNOSIS — H3521 Other non-diabetic proliferative retinopathy, right eye: Secondary | ICD-10-CM | POA: Diagnosis not present

## 2019-09-01 DIAGNOSIS — Z961 Presence of intraocular lens: Secondary | ICD-10-CM | POA: Diagnosis not present

## 2019-09-01 DIAGNOSIS — Z79899 Other long term (current) drug therapy: Secondary | ICD-10-CM | POA: Diagnosis not present

## 2019-09-01 DIAGNOSIS — H3321 Serous retinal detachment, right eye: Secondary | ICD-10-CM | POA: Diagnosis not present

## 2019-09-01 DIAGNOSIS — H35371 Puckering of macula, right eye: Secondary | ICD-10-CM | POA: Diagnosis not present

## 2019-09-15 DIAGNOSIS — N401 Enlarged prostate with lower urinary tract symptoms: Secondary | ICD-10-CM | POA: Diagnosis not present

## 2019-09-15 DIAGNOSIS — N5201 Erectile dysfunction due to arterial insufficiency: Secondary | ICD-10-CM | POA: Diagnosis not present

## 2019-09-15 DIAGNOSIS — R310 Gross hematuria: Secondary | ICD-10-CM | POA: Diagnosis not present

## 2019-09-15 DIAGNOSIS — R3912 Poor urinary stream: Secondary | ICD-10-CM | POA: Diagnosis not present

## 2019-09-24 ENCOUNTER — Other Ambulatory Visit: Payer: Self-pay | Admitting: Internal Medicine

## 2019-09-24 ENCOUNTER — Telehealth: Payer: Self-pay

## 2019-09-24 DIAGNOSIS — E291 Testicular hypofunction: Secondary | ICD-10-CM

## 2019-09-24 MED ORDER — OMNITROPE 5 MG/1.5ML ~~LOC~~ SOCT: 0.1500 mL | Freq: Every day | SUBCUTANEOUS | 1 refills | Status: DC

## 2019-09-24 NOTE — Telephone Encounter (Signed)
Spoke to the [facility] and they stated the following:    Omnitrope 5.08mg  [place] SOL 10 mL per 90 day supply  Sig: Add 1.14 mL of sterile diluent to each vial of omnitrope Inject 0.15 mL Monday thru Friday qhs.

## 2019-09-24 NOTE — Telephone Encounter (Signed)
PCP approved.   It has been printed and faxed.

## 2019-09-25 ENCOUNTER — Other Ambulatory Visit: Payer: Self-pay | Admitting: Internal Medicine

## 2019-09-25 DIAGNOSIS — E039 Hypothyroidism, unspecified: Secondary | ICD-10-CM

## 2019-09-29 ENCOUNTER — Telehealth: Payer: Self-pay

## 2019-09-29 DIAGNOSIS — E291 Testicular hypofunction: Secondary | ICD-10-CM

## 2019-09-29 NOTE — Telephone Encounter (Signed)
1.Medication Requested:Somatropin (OMNITROPE) 5 MG/1.5ML SOCT  2. Pharmacy (Name, [facility]): [place] [phone]   3. On Med List: Yes   4. Last Visit with PCP: 1.4.2021   5. Next visit date with PCP: no appt is made at this time     Agent: Please be advised that RX refills may take up to 3 business days. We ask that you follow-up with your pharmacy.

## 2019-09-29 NOTE — Telephone Encounter (Signed)
    [facility] calling to request refill request be faxed to [phone]

## 2019-09-30 MED ORDER — OMNITROPE 5 MG/1.5ML ~~LOC~~ SOCT: 0.1500 mL | Freq: Every day | SUBCUTANEOUS | 1 refills | Status: DC

## 2019-09-30 NOTE — Telephone Encounter (Signed)
rx reprinted for PCP to sign.   I will fax once I receive.

## 2019-10-01 NOTE — Telephone Encounter (Signed)
Rx has been faxed.

## 2019-10-07 ENCOUNTER — Other Ambulatory Visit: Payer: Self-pay | Admitting: Internal Medicine

## 2019-10-07 DIAGNOSIS — J411 Mucopurulent chronic bronchitis: Secondary | ICD-10-CM

## 2019-10-07 DIAGNOSIS — J4540 Moderate persistent asthma, uncomplicated: Secondary | ICD-10-CM

## 2019-10-20 ENCOUNTER — Other Ambulatory Visit: Payer: Self-pay | Admitting: Internal Medicine

## 2019-10-20 DIAGNOSIS — Z23 Encounter for immunization: Secondary | ICD-10-CM | POA: Diagnosis not present

## 2019-10-24 ENCOUNTER — Telehealth: Payer: Self-pay | Admitting: Internal Medicine

## 2019-10-24 NOTE — Telephone Encounter (Signed)
Key: [id number]  Pt contacted and informed that PA was approved.

## 2019-10-24 NOTE — Telephone Encounter (Signed)
     1. Name of Medication: Livalo  2. How are you currently taking this medication (dosage and times per day)? As written  3. Are you having a reaction (difficulty breathing--STAT)? no  4. What is your medication issue? Blue medicare calling to [employer] approved for 1 year starting today

## 2019-10-29 ENCOUNTER — Ambulatory Visit (INDEPENDENT_AMBULATORY_CARE_PROVIDER_SITE_OTHER): Payer: Medicare Other | Admitting: *Deleted

## 2019-10-29 DIAGNOSIS — I495 Sick sinus syndrome: Secondary | ICD-10-CM

## 2019-10-29 LAB — CUP PACEART REMOTE DEVICE CHECK
Battery Remaining Longevity: 165 mo
Battery Voltage: 3.1 V
Brady Statistic AP VP Percent: 0.08 %
Brady Statistic AP VS Percent: 34.71 %
Brady Statistic AS VP Percent: 0.08 %
Brady Statistic AS VS Percent: 65.13 %
Brady Statistic RA Percent Paced: 34.55 %
Brady Statistic RV Percent Paced: 0.16 %
Date Time Interrogation Session: 20210525210206
Implantable Lead Implant Date: 20200527
Implantable Lead Implant Date: 20200527
Implantable Lead Location: 753859
Implantable Lead Location: 753860
Implantable Lead Model: 5076
Implantable Lead Model: 5076
Implantable Pulse Generator Implant Date: 20200527
Lead Channel Impedance Value: 361 Ohm
Lead Channel Impedance Value: 437 Ohm
Lead Channel Impedance Value: 475 Ohm
Lead Channel Impedance Value: 646 Ohm
Lead Channel Pacing Threshold Amplitude: 0.5 V
Lead Channel Pacing Threshold Amplitude: 0.625 V
Lead Channel Pacing Threshold Pulse Width: 0.4 ms
Lead Channel Pacing Threshold Pulse Width: 0.4 ms
Lead Channel Sensing Intrinsic Amplitude: 5.25 mV
Lead Channel Sensing Intrinsic Amplitude: 5.25 mV
Lead Channel Sensing Intrinsic Amplitude: 9.75 mV
Lead Channel Sensing Intrinsic Amplitude: 9.75 mV
Lead Channel Setting Pacing Amplitude: 1.5 V
Lead Channel Setting Pacing Amplitude: 2.5 V
Lead Channel Setting Pacing Pulse Width: 0.4 ms
Lead Channel Setting Sensing Sensitivity: 2 mV

## 2019-10-30 NOTE — Progress Notes (Signed)
Remote pacemaker transmission.   

## 2019-10-31 ENCOUNTER — Other Ambulatory Visit: Payer: Self-pay | Admitting: Internal Medicine

## 2019-10-31 DIAGNOSIS — Z79899 Other long term (current) drug therapy: Secondary | ICD-10-CM | POA: Diagnosis not present

## 2019-10-31 DIAGNOSIS — H3321 Serous retinal detachment, right eye: Secondary | ICD-10-CM | POA: Diagnosis not present

## 2019-10-31 DIAGNOSIS — E291 Testicular hypofunction: Secondary | ICD-10-CM

## 2019-10-31 DIAGNOSIS — Z961 Presence of intraocular lens: Secondary | ICD-10-CM | POA: Diagnosis not present

## 2019-10-31 DIAGNOSIS — H35371 Puckering of macula, right eye: Secondary | ICD-10-CM | POA: Diagnosis not present

## 2019-10-31 DIAGNOSIS — H3521 Other non-diabetic proliferative retinopathy, right eye: Secondary | ICD-10-CM | POA: Diagnosis not present

## 2019-11-06 ENCOUNTER — Other Ambulatory Visit: Payer: Self-pay | Admitting: Internal Medicine

## 2019-11-22 ENCOUNTER — Other Ambulatory Visit: Payer: Self-pay | Admitting: Internal Medicine

## 2019-11-22 DIAGNOSIS — J4540 Moderate persistent asthma, uncomplicated: Secondary | ICD-10-CM

## 2019-11-23 ENCOUNTER — Other Ambulatory Visit: Payer: Self-pay | Admitting: Internal Medicine

## 2019-12-15 ENCOUNTER — Other Ambulatory Visit: Payer: Self-pay | Admitting: Internal Medicine

## 2019-12-21 ENCOUNTER — Other Ambulatory Visit: Payer: Self-pay | Admitting: Internal Medicine

## 2019-12-21 DIAGNOSIS — E039 Hypothyroidism, unspecified: Secondary | ICD-10-CM

## 2019-12-22 ENCOUNTER — Ambulatory Visit (INDEPENDENT_AMBULATORY_CARE_PROVIDER_SITE_OTHER): Payer: Medicare Other | Admitting: Internal Medicine

## 2019-12-22 ENCOUNTER — Encounter: Payer: Self-pay | Admitting: Internal Medicine

## 2019-12-22 ENCOUNTER — Other Ambulatory Visit: Payer: Self-pay

## 2019-12-22 VITALS — BP 154/74 | HR 60 | Temp 98.4°F | Resp 16 | Ht 72.0 in | Wt 212.5 lb

## 2019-12-22 DIAGNOSIS — R972 Elevated prostate specific antigen [PSA]: Secondary | ICD-10-CM

## 2019-12-22 DIAGNOSIS — J411 Mucopurulent chronic bronchitis: Secondary | ICD-10-CM | POA: Diagnosis not present

## 2019-12-22 DIAGNOSIS — N182 Chronic kidney disease, stage 2 (mild): Secondary | ICD-10-CM

## 2019-12-22 DIAGNOSIS — E039 Hypothyroidism, unspecified: Secondary | ICD-10-CM

## 2019-12-22 DIAGNOSIS — E785 Hyperlipidemia, unspecified: Secondary | ICD-10-CM

## 2019-12-22 DIAGNOSIS — N6320 Unspecified lump in the left breast, unspecified quadrant: Secondary | ICD-10-CM | POA: Insufficient documentation

## 2019-12-22 DIAGNOSIS — Z0000 Encounter for general adult medical examination without abnormal findings: Secondary | ICD-10-CM | POA: Diagnosis not present

## 2019-12-22 DIAGNOSIS — I2510 Atherosclerotic heart disease of native coronary artery without angina pectoris: Secondary | ICD-10-CM | POA: Diagnosis not present

## 2019-12-22 DIAGNOSIS — K219 Gastro-esophageal reflux disease without esophagitis: Secondary | ICD-10-CM | POA: Diagnosis not present

## 2019-12-22 DIAGNOSIS — I10 Essential (primary) hypertension: Secondary | ICD-10-CM | POA: Diagnosis not present

## 2019-12-22 NOTE — Progress Notes (Signed)
Subjective:  Patient ID: [name] Glass, male    DOB: 10/27/1940  Age: 79 y.o. MRN: [id number]  CC: Coronary Artery Disease, Hyperlipidemia, Annual Exam, and Hypothyroidism  This visit occurred during the SARS-CoV-2 public health emergency.  Safety protocols were in place, including screening questions prior to the visit, additional usage of staff PPE, and extensive cleaning of exam room while observing appropriate contact time as indicated for disinfecting solutions.    HPI [name] Glass presents for a CPX.  He complains that over the last week he has noticed a mass in his left breast over the rib cage.  He says the area looks normal.  He complains of chronic cough and shortness of breath.  He produces yellow phlegm in the morning.  He has been using a combination of a nebulized LAMA, LABA, and ICS and he tells me none of this has helped with his symptoms.  He wants to see a pulmonologist.  He has chronic unchanged dyspnea on exertion but no recent episodes of chest pain, diaphoresis, dizziness, or lightheadedness.  Outpatient Medications Prior to Visit  Medication Sig Dispense Refill  . acyclovir (ZOVIRAX) 200 MG capsule TAKE 1 CAPSULE BY MOUTH THREE TIMES A DAY 270 capsule 0  . anastrozole (ARIMIDEX) 1 MG tablet TAKE 1 TABLET (1 MG TOTAL) BY MOUTH 2 (TWO) TIMES A WEEK. 24 tablet 1  . ARMOUR THYROID 60 MG tablet TAKE 2 TABLETS BY MOUTH EVERY DAY 180 tablet 0  . aspirin EC 81 MG tablet Take 1 tablet (81 mg total) by mouth daily. 90 tablet 3  . azelastine (ASTELIN) 0.1 % nasal spray Place 2 sprays into both nostrils 2 (two) times daily. Use in each nostril as directed 90 mL 3  . clopidogrel (PLAVIX) 75 MG tablet TAKE 1 TABLET BY MOUTH EVERY DAY 90 tablet 0  . ezetimibe (ZETIA) 10 MG tablet TAKE 1 TABLET BY MOUTH EVERY DAY 90 tablet 1  . fluticasone (FLONASE) 50 MCG/ACT nasal spray SPRAY 2 SPRAYS INTO EACH NOSTRIL EVERY DAY 48 mL 1  . folic acid (FOLVITE) 1 MG tablet TAKE 1 TABLET BY MOUTH  EVERY DAY    . hydrALAZINE (APRESOLINE) 25 MG tablet TAKE 1 TABLET BY MOUTH THREE TIMES A DAY 270 tablet 1  . LIVALO 4 MG TABS TAKE 1 TABLET BY MOUTH EVERY DAY 90 tablet 1  . montelukast (SINGULAIR) 10 MG tablet TAKE 1 TABLET BY MOUTH EVERYDAY AT BEDTIME 90 tablet 1  . Nandrolone Decanoate 200 MG/ML OIL Inject 100 mg as directed once a week. 10 mL 1  . Potassium Gluconate 2.5 MEQ TABS Take by mouth.    . prednisoLONE acetate (PRED FORTE) 1 % ophthalmic suspension Place 1 drop into the right eye 4 times daily.    . Somatropin (OMNITROPE) 5 MG/1.5ML SOCT Inject 0.15 mLs into the skin daily. Add 1.14 mL of sterile diluent to each vial of omnitrope. Inject Monday thru Friday qhs. 10 mL 1  . SYRINGE-NEEDLE, DISP, 3 ML 23G X 1" 3 ML MISC Use to inject testosterone as prescribed. 50 each 0  . tamsulosin (FLOMAX) 0.4 MG CAPS capsule TAKE 1 CAPSULE BY MOUTH EVERY DAY IN THE MORNING 90 capsule 1  . testosterone cypionate (DEPOTESTOSTERONE CYPIONATE) 200 MG/ML injection Inject 1.25 mLs (250 mg total) into the muscle every 7 (seven) days. 20 mL 1  . BROVANA 15 MCG/2ML NEBU USE 1 VIAL  IN  NEBULIZER TWICE  DAILY - Morning and Evening 60 mL 11  .  budesonide (PULMICORT) 0.5 MG/2ML nebulizer solution USE 1 VIAL  IN  NEBULIZER TWICE  DAILY - Rinse mouth after treatment 120 mL 11  . liothyronine (CYTOMEL) 25 MCG tablet TAKE 1 TABLET BY MOUTH EVERY DAY IN THE MORNING 90 tablet 1  . TREXALL 5 MG tablet Take 10 mg by mouth once a week.    Maretta Bees 175 MCG/3ML nebulizer solution USE 1 VIAL IN NEBULIZER DAILY 30 mL 11   No facility-administered medications prior to visit.    ROS Review of Systems  Constitutional: Negative.  Negative for chills, diaphoresis, fatigue and fever.  HENT: Negative.   Respiratory: Positive for cough and shortness of breath. Negative for chest tightness and wheezing.   Cardiovascular: Negative for chest pain, palpitations and leg swelling.  Gastrointestinal: Negative for abdominal pain,  constipation, diarrhea, nausea and vomiting.  Endocrine: Negative.  Negative for cold intolerance and heat intolerance.  Genitourinary: Negative.  Negative for difficulty urinating and dysuria.  Musculoskeletal: Negative.  Negative for arthralgias and myalgias.  Skin: Negative.  Negative for color change, pallor and rash.  Neurological: Negative.  Negative for dizziness, weakness, light-headedness and numbness.  Hematological: Negative for adenopathy. Does not bruise/bleed easily.  Psychiatric/Behavioral: Negative.     Objective:  BP (!) 154/74 (BP Location: Left Arm, Patient Position: Sitting, Cuff Size: Normal)   Pulse 60   Temp 98.4 F (36.9 C) (Oral)   Resp 16   Ht 6' (1.829 m)   Wt 212 lb 8 oz (96.4 kg)   SpO2 96%   BMI 28.82 kg/m   BP Readings from Last 3 Encounters:  12/22/19 (!) 154/74  07/09/19 138/74  07/05/19 (!) 164/92    Wt Readings from Last 3 Encounters:  12/22/19 212 lb 8 oz (96.4 kg)  07/09/19 208 lb 6.4 oz (94.5 kg)  07/05/19 205 lb (93 kg)    Physical Exam Vitals reviewed.  Constitutional:      General: He is not in acute distress.    Appearance: He is not toxic-appearing or diaphoretic.  HENT:     Nose: Nose normal.     Mouth/Throat:     Mouth: Mucous membranes are moist.  Eyes:     General: No scleral icterus.    Conjunctiva/sclera: Conjunctivae normal.  Cardiovascular:     Rate and Rhythm: Normal rate and regular rhythm.     Heart sounds: Murmur heard.  Systolic murmur is present with a grade of 2/6.  No gallop.      Comments: 2/6 SEM Pulmonary:     Effort: Pulmonary effort is normal.     Breath sounds: No stridor. No wheezing, rhonchi or rales.  Chest:     Chest wall: Mass present. No deformity, tenderness or crepitus.     Breasts: Breasts are symmetrical.    Abdominal:     General: Abdomen is flat.     Palpations: There is no mass.     Tenderness: There is no abdominal tenderness. There is no guarding.  Musculoskeletal:      Cervical back: Normal range of motion and neck supple.     Right lower leg: No edema.     Left lower leg: No edema.  Lymphadenopathy:     Cervical: No cervical adenopathy.     Upper Body:     Right upper body: No supraclavicular, axillary or pectoral adenopathy.     Left upper body: No supraclavicular, axillary or pectoral adenopathy.  Skin:    General: Skin is warm and dry.  Coloration: Skin is not pale.  Neurological:     General: No focal deficit present.     Mental Status: He is alert.     Lab Results  Component Value Date   WBC 7.2 12/22/2019   HGB 16.3 12/22/2019   HCT 50.8 (H) 12/22/2019   PLT 315 12/22/2019   GLUCOSE 89 12/22/2019   CHOL 154 12/22/2019   TRIG 182 (H) 12/22/2019   HDL 46 12/22/2019   LDLCALC 80 12/22/2019   ALT 21 12/22/2019   AST 21 12/22/2019   NA 142 12/22/2019   K 4.5 12/22/2019   CL 107 12/22/2019   CREATININE 1.11 12/22/2019   BUN 18 12/22/2019   CO2 29 12/22/2019   TSH 0.07 (L) 12/22/2019   PSA 4.4 (H) 12/22/2019   INR 0.96 07/28/2014   HGBA1C 6.1 07/21/2017    CT Renal Stone Study  Result Date: 07/05/2019 CLINICAL DATA:  Patient with hematuria. EXAM: CT ABDOMEN AND PELVIS WITHOUT CONTRAST TECHNIQUE: Multidetector CT imaging of the abdomen and pelvis was performed following the standard protocol without IV contrast. COMPARISON:  CT abdomen pelvis 02/15/2012 FINDINGS: Lower chest: Heart is enlarged. Coronary arterial vascular calcifications. Minimal scarring/atelectasis within the lower lobes bilaterally. Hepatobiliary: Liver is normal in size and contour. Gallbladder is unremarkable. No intrahepatic or extrahepatic biliary ductal dilatation. 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 Adrenals/Urinary Tract: Normal adrenal glands. Kidneys are symmetric in size. No hydronephrosis. 2 mm stone within the midpole of the left kidney. Foley catheter is present within the urinary  bladder which is decompressed. There is a 5 mm stone adjacent to the Foley catheter at the level of the prosthetic urethra (image 66; series 4). There is a small amount of high attenuation within the inferior right aspect of the prostate. Stomach/Bowel: Normal morphology of the stomach. No evidence for bowel obstruction. No free fluid or free intraperitoneal air. Descending and sigmoid colonic diverticulosis without CT evidence for acute diverticulitis. Normal appendix. Vascular/Lymphatic: Infrarenal abdominal aortic ectasia. Peripheral calcification involving the abdominal aorta. No retroperitoneal lymphadenopathy. Reproductive: Enlarged prostate. Small amount of high attenuation within the posteroinferior right aspect of the prostate. Other: Fat containing left inguinal hernia. Additionally, there is a small amount of large bowel within the central aspect of the hernia without evidence for obstruction. Musculoskeletal: No aggressive or acute appearing osseous lesions. Lower thoracic and lumbar spine degenerative changes. IMPRESSION: There is a 5 mm calcification adjacent to the Foley catheter within the level of the prosthetic urethra, potentially representing a small stone. Small amount of high attenuation within the inferior right aspect of the prostate which may represent blood products or potentially calcification. Fat and large bowel containing left inguinal hernia without evidence for obstruction. Electronically Signed   By: Lovey Newcomer M.D.   On: 07/05/2019 06:10    Assessment & Plan:   [name] Glass was seen today for coronary artery disease, hyperlipidemia, annual exam and hypothyroidism.  Diagnoses and all orders for this visit:  Coronary artery disease involving native coronary artery of native heart without angina pectoris- He has chronic unchanged shortness of breath but no recent episodes of angina.  Will continue to address risk factor modifications. -     Lipid panel; Future -     Lipid  panel  Essential hypertension, benign- Considering his age and comorbid illnesses his blood pressure is adequately well controlled. -     CBC with Differential/Platelet; Future -     Basic metabolic panel;  Future -     Urinalysis, Routine w reflex microscopic; Future -     Urinalysis, Routine w reflex microscopic -     Basic metabolic panel -     CBC with Differential/Platelet  Chronic bronchitis, mucopurulent (HCC) -     Ambulatory referral to Pulmonology  Acquired hypothyroidism- His TSH is suppressed.  I recommended that he decrease the Cytomel to every other day. -     Thyroid Panel With TSH; Future -     Thyroid Panel With TSH -     liothyronine (CYTOMEL) 25 MCG tablet; Take 1 tablet (25 mcg total) by mouth every other day.  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  He will avoid nephrotoxic agents. -     Urinalysis, Routine w reflex microscopic; Future -     Urinalysis, Routine w reflex microscopic  Hyperlipidemia with target LDL less than 70- He has achieved his LDL goal and is doing well on the statin. -     Lipid panel; Future -     Thyroid Panel With TSH; Future -     Hepatic function panel; Future -     Hepatic function panel -     Thyroid Panel With TSH -     Lipid panel  Routine general medical examination at a health care facility-exam completed, labs reviewed, vaccines reviewed and updated, no cancer screenings are indicated, patient education material was given.  PSA elevation-his PSA is stable over the last few years which is a reassuring sign that he does not have prostate cancer. -     PSA; Future -     PSA  Mass of breast, left- I recommended that he undergo an ultrasound with biopsy if indicated. -     US BREAST ASPIRATION LEFT; Future  Other orders -     MICROSCOPIC MESSAGE   I have  discontinued [name] Glass's Trexall, [name] Glass, and budesonide. I have also changed his liothyronine. Additionally, I am having him maintain his aspirin EC, azelastine, prednisoLONE acetate, folic acid, Potassium Gluconate, testosterone cypionate, Nandrolone Decanoate, hydrALAZINE, Livalo, fluticasone, clopidogrel, SYRINGE-NEEDLE (DISP) 3 ML, Omnitrope, ezetimibe, anastrozole, tamsulosin, montelukast, acyclovir, and Armour Thyroid.  Meds ordered this encounter  Medications  . liothyronine (CYTOMEL) 25 MCG tablet    Sig: Take 1 tablet (25 mcg total) by mouth every other day.    Dispense:  45 tablet    Refill:  0   In addition to time spent on CPE, I spent 60 minutes in preparing to see the patient by review of recent labs, imaging and procedures, obtaining and reviewing separately obtained history, communicating with the patient and family or caregiver, ordering medications, tests or procedures, and documenting clinical information in the EHR including the differential Dx, treatment, and any further evaluation and other management of 1. Coronary artery disease involving native coronary artery of native heart without angina pectoris 2. Essential hypertension, benign 3. Chronic bronchitis, mucopurulent ([place]) 4. Acquired hypothyroidism 5. Gastroesophageal reflux disease without esophagitis 6. Chronic renal disease, stage 2, mildly decreased glomerular filtration rate (GFR) between 60-89 mL/min/1.73 square meter 7. Hyperlipidemia with target LDL less than 70 8. PSA elevation 9. Mass of breast, left    Follow-up: Return in about 3 months (around 03/23/2020).  Scarlette Calico, MD

## 2019-12-22 NOTE — Patient Instructions (Signed)
Hypothyroidism  Hypothyroidism is when the thyroid gland does not make enough of certain hormones (it is underactive). The thyroid gland is a small gland located in the lower front part of the neck, just in front of the windpipe (trachea). This gland makes hormones that help control how the body uses food for energy (metabolism) as well as how the heart and brain function. These hormones also play a role in keeping your bones strong. When the thyroid is underactive, it produces too little of the hormones thyroxine (T4) and triiodothyronine (T3). What are the causes? This condition may be caused by:  Hashimoto's disease. This is a disease in which the body's disease-fighting system (immune system) attacks the thyroid gland. This is the most common cause.  Viral infections.  Pregnancy.  Certain medicines.  Birth defects.  Past radiation treatments to the head or neck for cancer.  Past treatment with radioactive iodine.  Past exposure to radiation in the environment.  Past surgical removal of part or all of the thyroid.  Problems with a gland in the center of the brain (pituitary gland).  Lack of enough iodine in the diet. What increases the risk? You are more likely to develop this condition if:  You are male.  You have a family history of thyroid conditions.  You use a medicine called lithium.  You take medicines that affect the immune system (immunosuppressants). What are the signs or symptoms? Symptoms of this condition include:  Feeling as though you have no energy (lethargy).  Not being able to tolerate cold.  Weight gain that is not explained by a change in diet or exercise habits.  Lack of appetite.  Dry skin.  Coarse hair.  Menstrual irregularity.  Slowing of thought processes.  Constipation.  Sadness or depression. How is this diagnosed? This condition may be diagnosed based on:  Your symptoms, your medical history, and a physical exam.  Blood  tests. You may also have imaging tests, such as an ultrasound or MRI. How is this treated? This condition is treated with medicine that replaces the thyroid hormones that your body does not make. After you begin treatment, it may take several weeks for symptoms to go away. Follow these instructions at home:  Take over-the-counter and prescription medicines only as told by your health care provider.  If you start taking any new medicines, tell your health care provider.  Keep all follow-up visits as told by your health care provider. This is important. ? As your condition improves, your dosage of thyroid hormone medicine may change. ? You will need to have blood tests regularly so that your health care provider can monitor your condition. Contact a health care provider if:  Your symptoms do not get better with treatment.  You are taking thyroid replacement medicine and you: ? Sweat a lot. ? Have tremors. ? Feel anxious. ? Lose weight rapidly. ? Cannot tolerate heat. ? Have emotional swings. ? Have diarrhea. ? Feel weak. Get help right away if you have:  Chest pain.  An irregular heartbeat.  A rapid heartbeat.  Difficulty breathing. Summary  Hypothyroidism is when the thyroid gland does not make enough of certain hormones (it is underactive).  When the thyroid is underactive, it produces too little of the hormones thyroxine (T4) and triiodothyronine (T3).  The most common cause is Hashimoto's disease, a disease in which the body's disease-fighting system (immune system) attacks the thyroid gland. The condition can also be caused by viral infections, medicine, pregnancy, or past   radiation treatment to the head or neck.  Symptoms may include weight gain, dry skin, constipation, feeling as though you do not have energy, and not being able to tolerate cold.  This condition is treated with medicine to replace the thyroid hormones that your body does not make. This information  is not intended to replace advice given to you by your health care provider. Make sure you discuss any questions you have with your health care provider. Document Revised: 05/04/2017 Document Reviewed: 05/02/2017 Elsevier Patient Education  2020 [employer]  

## 2019-12-23 LAB — CBC WITH DIFFERENTIAL/PLATELET
Absolute Monocytes: 1512 cells/uL — ABNORMAL HIGH (ref 200–950)
Basophils Absolute: 43 cells/uL (ref 0–200)
Basophils Relative: 0.6 %
Eosinophils Absolute: 94 cells/uL (ref 15–500)
Eosinophils Relative: 1.3 %
HCT: 50.8 % — ABNORMAL HIGH (ref 38.5–50.0)
Hemoglobin: 16.3 g/dL (ref 13.2–17.1)
Lymphs Abs: 2390 cells/uL (ref 850–3900)
MCH: 29.2 pg (ref 27.0–33.0)
MCHC: 32.1 g/dL (ref 32.0–36.0)
MCV: 91 fL (ref 80.0–100.0)
MPV: 10.5 fL (ref 7.5–12.5)
Monocytes Relative: 21 %
Neutro Abs: 3161 cells/uL (ref 1500–7800)
Neutrophils Relative %: 43.9 %
Platelets: 315 10*3/uL (ref 140–400)
RBC: 5.58 10*6/uL (ref 4.20–5.80)
RDW: 14.8 % (ref 11.0–15.0)
Total Lymphocyte: 33.2 %
WBC: 7.2 10*3/uL (ref 3.8–10.8)

## 2019-12-23 LAB — URINALYSIS, ROUTINE W REFLEX MICROSCOPIC
Bacteria, UA: NONE SEEN /HPF
Bilirubin Urine: NEGATIVE
Glucose, UA: NEGATIVE
Hgb urine dipstick: NEGATIVE
Hyaline Cast: NONE SEEN /LPF
Nitrite: NEGATIVE
Specific Gravity, Urine: 1.018 (ref 1.001–1.03)
Squamous Epithelial / HPF: NONE SEEN /HPF (ref <=5)
pH: 6 (ref 5.0–8.0)

## 2019-12-23 LAB — BASIC METABOLIC PANEL
BUN: 18 mg/dL (ref 7–25)
CO2: 29 mmol/L (ref 20–32)
Calcium: 10.1 mg/dL (ref 8.6–10.3)
Chloride: 107 mmol/L (ref 98–110)
Creat: 1.11 mg/dL (ref 0.70–1.18)
Glucose, Bld: 89 mg/dL (ref 65–99)
Potassium: 4.5 mmol/L (ref 3.5–5.3)
Sodium: 142 mmol/L (ref 135–146)

## 2019-12-23 LAB — HEPATIC FUNCTION PANEL
AG Ratio: 1.8 (calc) (ref 1.0–2.5)
ALT: 21 U/L (ref 9–46)
AST: 21 U/L (ref 10–35)
Albumin: 4.4 g/dL (ref 3.6–5.1)
Alkaline phosphatase (APISO): 41 U/L (ref 35–144)
Bilirubin, Direct: 0.2 mg/dL (ref 0.0–0.2)
Globulin: 2.5 g/dL (calc) (ref 1.9–3.7)
Indirect Bilirubin: 0.5 mg/dL (calc) (ref 0.2–1.2)
Total Bilirubin: 0.7 mg/dL (ref 0.2–1.2)
Total Protein: 6.9 g/dL (ref 6.1–8.1)

## 2019-12-23 LAB — LIPID PANEL
Cholesterol: 154 mg/dL (ref <200)
HDL: 46 mg/dL (ref >=40)
LDL Cholesterol (Calc): 80 mg/dL (calc)
Non-HDL Cholesterol (Calc): 108 mg/dL (calc) (ref <130)
Total CHOL/HDL Ratio: 3.3 (calc) (ref <5.0)
Triglycerides: 182 mg/dL — ABNORMAL HIGH (ref <150)

## 2019-12-23 LAB — THYROID PANEL WITH TSH
Free Thyroxine Index: 1.4 (ref 1.4–3.8)
T3 Uptake: 36 % — ABNORMAL HIGH (ref 22–35)
T4, Total: 3.8 ug/dL — ABNORMAL LOW (ref 4.9–10.5)
TSH: 0.07 mIU/L — ABNORMAL LOW (ref 0.40–4.50)

## 2019-12-23 LAB — PSA: PSA: 4.4 ng/mL — ABNORMAL HIGH (ref <=4.0)

## 2019-12-23 MED ORDER — LIOTHYRONINE SODIUM 25 MCG PO TABS: 25.0000 ug | ORAL_TABLET | ORAL | 0 refills | Status: DC

## 2019-12-24 ENCOUNTER — Other Ambulatory Visit: Payer: Self-pay | Admitting: Internal Medicine

## 2019-12-26 ENCOUNTER — Telehealth: Payer: Self-pay | Admitting: Internal Medicine

## 2019-12-26 DIAGNOSIS — N6320 Unspecified lump in the left breast, unspecified quadrant: Secondary | ICD-10-CM

## 2019-12-26 DIAGNOSIS — Z9289 Personal history of other medical treatment: Secondary | ICD-10-CM

## 2019-12-26 NOTE — Telephone Encounter (Addendum)
Per Ena Dawley at [facility]- phone [phone] Please order bilateral diagnostic mammogram with possible ultrasound

## 2019-12-26 NOTE — Telephone Encounter (Signed)
Diagnostic Mammogram ordered per request.

## 2019-12-30 ENCOUNTER — Other Ambulatory Visit: Payer: Self-pay | Admitting: Internal Medicine

## 2019-12-30 DIAGNOSIS — Z9289 Personal history of other medical treatment: Secondary | ICD-10-CM

## 2019-12-30 DIAGNOSIS — N6320 Unspecified lump in the left breast, unspecified quadrant: Secondary | ICD-10-CM

## 2020-01-14 ENCOUNTER — Other Ambulatory Visit: Payer: Medicare Other

## 2020-01-19 ENCOUNTER — Other Ambulatory Visit: Payer: Self-pay | Admitting: Internal Medicine

## 2020-01-19 ENCOUNTER — Ambulatory Visit
Admission: RE | Admit: 2020-01-19 | Discharge: 2020-01-19 | Disposition: A | Discharge status: Home / Self Care | Payer: Medicare Other | Source: Ambulatory Visit | Attending: Internal Medicine | Admitting: Internal Medicine

## 2020-01-19 ENCOUNTER — Other Ambulatory Visit: Payer: Self-pay

## 2020-01-19 DIAGNOSIS — Z9289 Personal history of other medical treatment: Secondary | ICD-10-CM

## 2020-01-19 DIAGNOSIS — N6320 Unspecified lump in the left breast, unspecified quadrant: Secondary | ICD-10-CM

## 2020-01-19 DIAGNOSIS — N6310 Unspecified lump in the right breast, unspecified quadrant: Secondary | ICD-10-CM

## 2020-01-19 DIAGNOSIS — I2510 Atherosclerotic heart disease of native coronary artery without angina pectoris: Secondary | ICD-10-CM

## 2020-01-19 DIAGNOSIS — N6489 Other specified disorders of breast: Secondary | ICD-10-CM | POA: Diagnosis not present

## 2020-01-19 DIAGNOSIS — E785 Hyperlipidemia, unspecified: Secondary | ICD-10-CM

## 2020-01-19 DIAGNOSIS — N6321 Unspecified lump in the left breast, upper outer quadrant: Secondary | ICD-10-CM | POA: Diagnosis not present

## 2020-01-20 ENCOUNTER — Ambulatory Visit (INDEPENDENT_AMBULATORY_CARE_PROVIDER_SITE_OTHER): Payer: Medicare Other | Admitting: Pulmonary Disease

## 2020-01-20 ENCOUNTER — Encounter: Payer: Self-pay | Admitting: Pulmonary Disease

## 2020-01-20 VITALS — BP 130/72 | HR 60 | Temp 97.4°F | Ht 72.0 in | Wt 215.8 lb

## 2020-01-20 DIAGNOSIS — R0683 Snoring: Secondary | ICD-10-CM | POA: Diagnosis not present

## 2020-01-20 DIAGNOSIS — R05 Cough: Secondary | ICD-10-CM

## 2020-01-20 DIAGNOSIS — R0600 Dyspnea, unspecified: Secondary | ICD-10-CM

## 2020-01-20 DIAGNOSIS — R059 Cough, unspecified: Secondary | ICD-10-CM

## 2020-01-20 MED ORDER — IPRATROPIUM BROMIDE 0.03 % NA SOLN: 2.0000 | Freq: Three times a day (TID) | NASAL | 12 refills | Status: DC | PRN

## 2020-01-20 NOTE — Patient Instructions (Signed)
Start iptratropium nasal spray 3 times per day as needed We will order pulmonary function tests and home sleep study We will call you with the results of the pulmonary function test and if we need to start inhaler therapy We will call you with the home sleep study results

## 2020-01-23 NOTE — Progress Notes (Signed)
@Patient  ID: [name] Glass, male    DOB: 01/14/1941, 79 y.o.   MRN: [id number]  Chief Complaint  Patient presents with   Consult    referred by PCP for chronic bronchitis. CAT: 18    Referring provider: [name], MD  HPI: [name] Glass is a 79 year old male with coronary artery disease status post stent in 2020, sick sinus syndrome s/p pacemaker, hypertension, allergic rhinitis, GERD and obstructive sleep apnea not on CPAP who is referred to pulmonary clinic for shortness of breath and concern for chronic bronchitis.   He reports over the past year he has had progressive shortness of breath that is now limiting his daily activities and social activities. The shortness of breath is with exertion. 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 Echo on 10/25/2018 reviewed showing normal EF 55-60%. No diastolic dysfunction noted.   He has history of sinusitis and chronic rhinitis in which he takes fluticasone nasal spray. He has had sinus/septal surgery in the past. He complains of post-nasal drip and runny nose. He does not note sinus congestion. He also has history of moderate severe obstructive sleep apnea with an AHI of 37 based on a sleep study from 05/30/2005. He had uvula surgery to aid in the severity of the sleep apnea as he did not tolerate the CPAP mask. He does not report significant improvement in his sleep since the surgery but it has cut down on his snoring some.   He does report cough, more so in the morning with production of yellowish sputum. After he clears his chest in the morning he does not report having cough or wheezing throughout the day. He was placed on nebulizer treatments in the past which did not provide any relief.      Chest radiograph 06/09/2019 reviewed: signs of hyperinflation with flattened diaphragms and  increased retrosternal airspace.   CTA Chest 10/22/2018 reviewed: lung parenchyma appears normal as well as the airways. The trachea has a slight saber sheath appearance.   Allergies  Allergen Reactions   Morphine And Related     Had "crazy dreams" felt "crazy" per pt.   Bactrim [Sulfamethoxazole-Trimethoprim]    Statins     Immunization History  Administered Date(s) Administered   Fluad Quad(high Dose 65+) 04/01/2019   Influenza, High Dose Seasonal PF 07/13/2016, 04/19/2017   PFIZER SARS-COV-2 Vaccination 06/24/2019, 07/15/2019   Pneumococcal Conjugate-13 06/26/2017   Pneumococcal Polysaccharide-23 12/16/2015   Tdap 06/26/2017   Zoster Recombinat (Shingrix) 06/14/2019    Past Medical History:  Diagnosis Date   Arthritis    Coronary artery disease    GERD (gastroesophageal reflux disease)    H/O bronchitis    H/O hiatal hernia    History of kidney stones last in 1976   Hyperlipidemia    statin intolerant, under control   Hypertension    under control   Hypertensive retinopathy    OU   Hypothyroidism    Moderate persistent asthma without complication 07/10/3662   Retinal detachment    OD   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 24 tablet 1   ARMOUR THYROID 60 MG tablet TAKE 2 TABLETS BY MOUTH EVERY DAY 180 tablet 0   aspirin EC 81 MG tablet Take 1 tablet (81 mg total) by mouth daily. 90 tablet 3   azelastine (ASTELIN) 0.1 % nasal spray Place 2 sprays into  both nostrils 2 (two) times daily. Use in each nostril as directed 90 mL 3   clopidogrel (PLAVIX) 75 MG tablet TAKE 1 TABLET BY MOUTH EVERY DAY 90 tablet 0   ezetimibe (ZETIA) 10 MG tablet TAKE 1 TABLET BY MOUTH EVERY DAY 90 tablet 1   fluticasone (FLONASE) 50 MCG/ACT nasal spray SPRAY 2 SPRAYS INTO EACH NOSTRIL EVERY DAY 48 mL 1   folic acid (FOLVITE) 1 MG tablet TAKE 1 TABLET BY MOUTH EVERY DAY     hydrALAZINE (APRESOLINE) 25 MG tablet TAKE 1 TABLET BY MOUTH THREE TIMES A DAY 270 tablet 1   liothyronine (CYTOMEL) 25 MCG tablet Take 1 tablet (25 mcg total) by mouth every other day. 45 tablet 0   LIVALO 4 MG TABS TAKE 1 TABLET BY MOUTH EVERY DAY 90 tablet 1   montelukast (SINGULAIR) 10 MG tablet TAKE 1 TABLET BY MOUTH EVERYDAY AT BEDTIME 90 tablet 1   Nandrolone Decanoate 200 MG/ML OIL Inject 100 mg as directed once a week. 10 mL 1   Potassium Gluconate 2.5 MEQ TABS Take by mouth.     prednisoLONE acetate (PRED FORTE) 1 % ophthalmic suspension Place 1 drop into the right eye 4 times daily.     Somatropin (OMNITROPE) 5 MG/1.5ML SOCT Inject 0.15 mLs into the skin daily. Add 1.14 mL of sterile diluent to each vial of omnitrope. Inject Monday thru Friday qhs. 10 mL 1   SYRINGE-NEEDLE, DISP, 3 ML 23G X 1" 3 ML MISC Use to inject testosterone as prescribed. 50 each 0   tamsulosin (FLOMAX) 0.4 MG CAPS capsule TAKE 1 CAPSULE BY MOUTH EVERY DAY IN THE MORNING 90 capsule 1   testosterone cypionate (DEPOTESTOSTERONE CYPIONATE) 200 MG/ML injection Inject 1.25 mLs (250 mg total) into the muscle every 7 (seven) days. 20 mL 1   No facility-administered medications prior to visit.     Review of Systems:   Constitutional:   No  weight loss, night sweats,  Fevers, chills, fatigue  HEENT:   No headaches,  Difficulty swallowing,  Tooth/dental problems, or  Sore throat,                No sneezing, itching, ear ache, nasal congestion. + post nasal drip   CV:  No chest pain,  Orthopnea, PND,  swelling in lower extremities, anasarca, dizziness, palpitations, syncope.   GI  No heartburn, indigestion, abdominal pain, nausea, vomiting, diarrhea, change in bowel habits, loss of appetite, bloody stools.   Resp: + shortness of breath with exertion.  No coughing up of blood.  No change in color of mucus.  No wheezing.  No chest wall deformity  Skin: no rash or lesions.  GU: no dysuria, change in color of urine, no urgency or frequency.  No flank pain, no hematuria   MS:  No joint pain or swelling.  No decreased range of motion.  No back pain.  Physical Exam  BP 130/72 (BP Location: Left Arm, Cuff Size: Normal)    Pulse 60    Temp (!) 97.4 F (36.3 C) (Temporal)    Ht 6' (1.829 m)    Wt 215 lb 12.8 oz (97.9 kg)    SpO2 95%    BMI 29.27 kg/m   GEN: A/Ox3; pleasant , NAD, well nourished    HEENT:  Animas/AT,  EACs-clear, TMs-wnl, NOSE-clear, THROAT-erythematous with clear secretions, nasally/hoarse sounding voice  NECK:  Supple w/ fair ROM; no JVD; normal carotid impulses w/o bruits; no thyromegaly or nodules palpated; no lymphadenopathy.    RESP  Clear  P & A; w/o, wheezes/ rales/ or rhonchi. no accessory muscle use, no dullness to percussion  CARD:  RRR, no m/r/g, no peripheral edema, pulses intact, no cyanosis or clubbing.  GI:   Soft & nt; nml bowel sounds; no organomegaly or masses detected.   Musco: Warm bil, no deformities or joint swelling noted.   Neuro: alert, no focal deficits noted.    Skin: Warm, no lesions or rashes    Lab Results:  CBC    Component Value Date/Time   WBC 7.2 12/22/2019 1155   RBC 5.58 12/22/2019 1155   HGB 16.3 12/22/2019 1155   HCT 50.8 (H) 12/22/2019 1155   PLT 315 12/22/2019 1155   MCV 91.0 12/22/2019 1155   MCH 29.2 12/22/2019 1155   MCHC 32.1 12/22/2019 1155   RDW 14.8 12/22/2019 1155   LYMPHSABS 2,390 12/22/2019 1155   MONOABS 0.9 04/01/2019 1056   EOSABS 94 12/22/2019 1155   BASOSABS 43 12/22/2019 1155    BMET      Component Value Date/Time   NA 142 12/22/2019 1155   NA 144 05/24/2018 0000   K 4.5 12/22/2019 1155   CL 107 12/22/2019 1155   CO2 29 12/22/2019 1155   GLUCOSE 89 12/22/2019 1155   BUN 18 12/22/2019 1155   BUN 13 05/24/2018 0000   CREATININE 1.11 12/22/2019 1155   CALCIUM 10.1 12/22/2019 1155   GFRNONAA >60 10/31/2018 0435   GFRAA >60 10/31/2018 0435    BNP    Component Value Date/Time   BNP 60.5 10/22/2018 1051    Imaging: Reviewed as above in HPI  Assessment & Plan:  Nathian Stencil is a 79 year old male with coronary artery disease status post stent in 2020, sick sinus syndrome s/p pacemaker, hypertension, allergic rhinitis, GERD and obstructive sleep apnea not on CPAP who is referred to pulmonary clinic for shortness of breath and concern for chronic bronchitis.   The etiology of his shortness of breath is not clear at this time. He has multiple problems including untreated obstructive sleep apnea, chronic rhinitis and post nasal drip leading to cough. He is a life-time non-smoker and does not complain of wheezing symptoms. His chest radiograph does indicate some degree of hyperinflation concerning for an obstructive lung disease.   We will obtain an updated home sleep study to quantify the severity of sleep apnea and get him setup with CPAP therapy and mask fitting once the results are back. We will obtain complete pulmonary function tests to assess concern for obstructive lung disease and cough. We will treat his chronic rhinitis with the addition of ipratropium nasal spray in addition to his fluticasone nasal spray.   He is to follow up in 2 months in clinic   Freddi Starr, MD 01/23/2020

## 2020-01-26 DIAGNOSIS — H35371 Puckering of macula, right eye: Secondary | ICD-10-CM | POA: Diagnosis not present

## 2020-01-26 DIAGNOSIS — H3321 Serous retinal detachment, right eye: Secondary | ICD-10-CM | POA: Diagnosis not present

## 2020-01-26 DIAGNOSIS — H3521 Other non-diabetic proliferative retinopathy, right eye: Secondary | ICD-10-CM | POA: Diagnosis not present

## 2020-01-26 DIAGNOSIS — Z79899 Other long term (current) drug therapy: Secondary | ICD-10-CM | POA: Diagnosis not present

## 2020-01-26 DIAGNOSIS — Z961 Presence of intraocular lens: Secondary | ICD-10-CM | POA: Diagnosis not present

## 2020-01-27 ENCOUNTER — Other Ambulatory Visit: Payer: Self-pay | Admitting: Internal Medicine

## 2020-01-28 ENCOUNTER — Ambulatory Visit (INDEPENDENT_AMBULATORY_CARE_PROVIDER_SITE_OTHER): Payer: Medicare Other | Admitting: *Deleted

## 2020-01-28 ENCOUNTER — Telehealth: Payer: Self-pay

## 2020-01-28 ENCOUNTER — Other Ambulatory Visit: Payer: Self-pay | Admitting: Internal Medicine

## 2020-01-28 DIAGNOSIS — I495 Sick sinus syndrome: Secondary | ICD-10-CM | POA: Diagnosis not present

## 2020-01-28 DIAGNOSIS — E291 Testicular hypofunction: Secondary | ICD-10-CM

## 2020-01-28 MED ORDER — NANDROLONE DECANOATE 200 MG/ML IJ OIL: 100.0000 mg | TOPICAL_OIL | INTRAMUSCULAR | 1 refills | Status: DC

## 2020-01-28 NOTE — Telephone Encounter (Signed)
New message    Pt c/o medication issue:  1. Name of Medication: Nandrolone Decanoate 200 MG/ML OIL - new medication    2. How are you currently taking this medication (dosage and times per day)? Injection   3. Are you having a reaction (difficulty breathing--STAT)? No   4. What is your medication issue? Please clarify if patient is currently on testosterone cypionate (DEPOTESTOSTERONE CYPIONATE) 200 MG/ML injection

## 2020-01-28 NOTE — Telephone Encounter (Signed)
Spoke to [facility] and informed that patient is not on the testosterone rx and that pt is taking the nandrolone decanoate instead.   Also informed Misty that the rx was sent to [facility] by mistake. She will delete the rx out of their system.   I have updated that med list.

## 2020-01-29 ENCOUNTER — Ambulatory Visit: Payer: Medicare Other

## 2020-01-29 LAB — CUP PACEART REMOTE DEVICE CHECK
Battery Remaining Longevity: 161 mo
Battery Voltage: 3.07 V
Brady Statistic AP VP Percent: 0.38 %
Brady Statistic AP VS Percent: 39.66 %
Brady Statistic AS VP Percent: 0.35 %
Brady Statistic AS VS Percent: 59.6 %
Brady Statistic RA Percent Paced: 39.74 %
Brady Statistic RV Percent Paced: 0.74 %
Date Time Interrogation Session: 20210824232506
Implantable Lead Implant Date: 20200527
Implantable Lead Implant Date: 20200527
Implantable Lead Location: 753859
Implantable Lead Location: 753860
Implantable Lead Model: 5076
Implantable Lead Model: 5076
Implantable Pulse Generator Implant Date: 20200527
Lead Channel Impedance Value: 361 Ohm
Lead Channel Impedance Value: 399 Ohm
Lead Channel Impedance Value: 494 Ohm
Lead Channel Impedance Value: 627 Ohm
Lead Channel Pacing Threshold Amplitude: 0.5 V
Lead Channel Pacing Threshold Amplitude: 0.625 V
Lead Channel Pacing Threshold Pulse Width: 0.4 ms
Lead Channel Pacing Threshold Pulse Width: 0.4 ms
Lead Channel Sensing Intrinsic Amplitude: 4.75 mV
Lead Channel Sensing Intrinsic Amplitude: 4.75 mV
Lead Channel Sensing Intrinsic Amplitude: 9.375 mV
Lead Channel Sensing Intrinsic Amplitude: 9.375 mV
Lead Channel Setting Pacing Amplitude: 1.5 V
Lead Channel Setting Pacing Amplitude: 2.5 V
Lead Channel Setting Pacing Pulse Width: 0.4 ms
Lead Channel Setting Sensing Sensitivity: 2 mV

## 2020-02-03 NOTE — Progress Notes (Signed)
Remote pacemaker transmission.   

## 2020-02-05 ENCOUNTER — Institutional Professional Consult (permissible substitution): Payer: Medicare Other | Admitting: Pulmonary Disease

## 2020-02-10 ENCOUNTER — Ambulatory Visit (INDEPENDENT_AMBULATORY_CARE_PROVIDER_SITE_OTHER): Payer: Medicare Other

## 2020-02-10 ENCOUNTER — Ambulatory Visit: Payer: Medicare Other

## 2020-02-10 ENCOUNTER — Ambulatory Visit (INDEPENDENT_AMBULATORY_CARE_PROVIDER_SITE_OTHER): Payer: Medicare Other | Admitting: Pulmonary Disease

## 2020-02-10 ENCOUNTER — Other Ambulatory Visit: Payer: Self-pay

## 2020-02-10 VITALS — BP 120/60 | HR 61 | Temp 98.2°F | Resp 16 | Ht 72.0 in | Wt 217.4 lb

## 2020-02-10 DIAGNOSIS — R0683 Snoring: Secondary | ICD-10-CM

## 2020-02-10 DIAGNOSIS — G4733 Obstructive sleep apnea (adult) (pediatric): Secondary | ICD-10-CM | POA: Diagnosis not present

## 2020-02-10 DIAGNOSIS — Z0000 Encounter for general adult medical examination without abnormal findings: Secondary | ICD-10-CM | POA: Diagnosis not present

## 2020-02-10 DIAGNOSIS — R0600 Dyspnea, unspecified: Secondary | ICD-10-CM | POA: Diagnosis not present

## 2020-02-10 LAB — PULMONARY FUNCTION TEST
DL/VA % pred: 116 %
DL/VA: 4.49 ml/min/mmHg/L
DLCO cor % pred: 87 %
DLCO cor: 22.76 ml/min/mmHg
DLCO unc % pred: 87 %
DLCO unc: 22.76 ml/min/mmHg
FEF 25-75 Post: 1.25 L/sec
FEF 25-75 Pre: 1.15 L/sec
FEF2575-%Change-Post: 8 %
FEF2575-%Pred-Post: 56 %
FEF2575-%Pred-Pre: 51 %
FEV1-%Change-Post: 2 %
FEV1-%Pred-Post: 68 %
FEV1-%Pred-Pre: 67 %
FEV1-Post: 2.18 L
FEV1-Pre: 2.13 L
FEV1FVC-%Change-Post: 8 %
FEV1FVC-%Pred-Pre: 92 %
FEV6-%Change-Post: -4 %
FEV6-%Pred-Post: 71 %
FEV6-%Pred-Pre: 75 %
FEV6-Post: 2.97 L
FEV6-Pre: 3.13 L
FEV6FVC-%Change-Post: 0 %
FEV6FVC-%Pred-Post: 104 %
FEV6FVC-%Pred-Pre: 104 %
FVC-%Change-Post: -5 %
FVC-%Pred-Post: 68 %
FVC-%Pred-Pre: 72 %
FVC-Post: 3.03 L
FVC-Pre: 3.2 L
Post FEV1/FVC ratio: 72 %
Post FEV6/FVC ratio: 98 %
Pre FEV1/FVC ratio: 67 %
Pre FEV6/FVC Ratio: 98 %
RV % pred: 124 %
RV: 3.42 L
TLC % pred: 89 %
TLC: 6.68 L

## 2020-02-10 NOTE — Progress Notes (Signed)
Subjective:   [name] Glass is a 79 y.o. male who presents for Medicare Annual/Subsequent preventive examination.  Review of Systems    No ROS. Medicare Wellness Visit Cardiac Risk Factors include: advanced age (>3men, >64 women);dyslipidemia;family history of premature cardiovascular disease;hypertension;male gender     Objective:    Today's Vitals   02/10/20 1358  BP: 120/60  Pulse: 61  Resp: 16  Temp: 98.2 F (36.8 C)  SpO2: 92%  Weight: 217 lb 6.4 oz (98.6 kg)  Height: 6' (1.829 m)  PainSc: 0-No pain   Body mass index is 29.48 kg/m.  Advanced Directives 02/10/2020 07/05/2019 10/30/2018 10/25/2018 10/22/2018 08/19/2014 08/03/2014  Does Patient Have a Medical Advance Directive? Yes Yes - Yes No Yes Yes  Type of Advance Directive Living will;Healthcare Power of [place];Living will [place];Living will [place];Living will - [place];Living will [place];Living will  Does patient want to make changes to medical advance directive? No - Patient declined No - Patient declined - - - - No - Patient declined  Copy of [place] in Chart? No - copy requested No - copy requested No - copy requested No - copy requested - - No - copy requested  Would patient like information on creating a medical advance directive? - - No - Patient declined - No - Patient declined - -  Pre-existing out of facility DNR order (yellow form or pink MOST form) - - - - - - -    Current Medications (verified) Outpatient Encounter Medications as of 02/10/2020  Medication Sig  . acyclovir (ZOVIRAX) 200 MG capsule TAKE 1 CAPSULE BY MOUTH THREE TIMES A DAY  . anastrozole (ARIMIDEX) 1 MG tablet TAKE 1 TABLET (1 MG TOTAL) BY MOUTH 2 (TWO) TIMES A WEEK.  [name] ARMOUR THYROID 60 MG tablet TAKE 2 TABLETS BY MOUTH EVERY DAY  . aspirin EC 81 MG tablet Take 1 tablet (81 mg total) by mouth daily.  [name]  ezetimibe (ZETIA) 10 MG tablet TAKE 1 TABLET BY MOUTH EVERY DAY  . fluticasone (FLONASE) 50 MCG/ACT nasal spray SPRAY 2 SPRAYS INTO EACH NOSTRIL EVERY DAY  . hydrALAZINE (APRESOLINE) 25 MG tablet TAKE 1 TABLET BY MOUTH THREE TIMES A DAY  . ipratropium (ATROVENT) 0.03 % nasal spray Place 2 sprays into both nostrils 3 (three) times daily as needed for rhinitis.  [name] liothyronine (CYTOMEL) 25 MCG tablet Take 1 tablet (25 mcg total) by mouth every other day.  [name] LIVALO 4 MG TABS TAKE 1 TABLET BY MOUTH EVERY DAY  . montelukast (SINGULAIR) 10 MG tablet TAKE 1 TABLET BY MOUTH EVERYDAY AT BEDTIME  . Nandrolone Decanoate 200 MG/ML OIL Inject 100 mg as directed once a week.  . Potassium Gluconate 2.5 MEQ TABS Take by mouth.  . Somatropin (OMNITROPE) 5 MG/1.5ML SOCT Inject 0.15 mLs into the skin daily. Add 1.14 mL of sterile diluent to each vial of omnitrope. Inject Monday thru Friday qhs.  . SYRINGE-NEEDLE, DISP, 3 ML 23G X 1" 3 ML MISC Use to inject testosterone as prescribed.  . tamsulosin (FLOMAX) 0.4 MG CAPS capsule TAKE 1 CAPSULE BY MOUTH EVERY DAY IN THE MORNING  . [DISCONTINUED] folic acid (FOLVITE) 1 MG tablet TAKE 1 TABLET BY MOUTH EVERY DAY  . [DISCONTINUED] anastrozole (ARIMIDEX) 1 MG tablet Take 1 tablet (1 mg total) by mouth 2 (two) times a week.  . [DISCONTINUED] azelastine (ASTELIN) 0.1 % nasal spray  Place 2 sprays into both nostrils 2 (two) times daily. Use in each nostril as directed  . [DISCONTINUED] clopidogrel (PLAVIX) 75 MG tablet TAKE 1 TABLET BY MOUTH EVERY DAY  . [DISCONTINUED] clopidogrel (PLAVIX) 75 MG tablet TAKE 1 TABLET BY MOUTH EVERY DAY  . [DISCONTINUED] ezetimibe (ZETIA) 10 MG tablet Take 1 tablet (10 mg total) by mouth daily.  . [DISCONTINUED] fluticasone (FLONASE) 50 MCG/ACT nasal spray Place 2 sprays into both nostrils daily.  . [DISCONTINUED] hydrALAZINE (APRESOLINE) 25 MG tablet TAKE 1 TABLET BY MOUTH 3 TIMES A DAY  . [DISCONTINUED] liothyronine (CYTOMEL) 25 MCG tablet  TAKE 1 TABLET BY MOUTH EVERY DAY IN THE MORNING  . [DISCONTINUED] montelukast (SINGULAIR) 10 MG tablet Take 1 tablet (10 mg total) by mouth at bedtime.  . [DISCONTINUED] Pitavastatin Calcium (LIVALO) 4 MG TABS Take 1 tablet (4 mg total) by mouth daily.  . [DISCONTINUED] prednisoLONE acetate (PRED FORTE) 1 % ophthalmic suspension Place 1 drop into the right eye 4 times daily.  . [DISCONTINUED] tamsulosin (FLOMAX) 0.4 MG CAPS capsule TAKE 1 CAPSULE BY MOUTH EVERY DAY IN THE MORNING   No facility-administered encounter medications on file as of 02/10/2020.    Allergies (verified) Morphine and related, Bactrim [sulfamethoxazole-trimethoprim], and Statins   History: Past Medical History:  Diagnosis Date  . Arthritis   . Coronary artery disease   . GERD (gastroesophageal reflux disease)   . H/O bronchitis   . H/O hiatal hernia   . History of kidney stones last in 1976  . Hyperlipidemia    statin intolerant, under control  . Hypertension    under control  . Hypertensive retinopathy    OU  . Hypothyroidism   . Moderate persistent asthma without complication 02/04/1193  . Retinal detachment    OD  . 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   left helix tx cx3 [id number]  . Sleep apnea    hx of, surgery to reverse   Past Surgical History:  Procedure Laterality Date  . CARDIAC CATHETERIZATION Right 2008   5 heart stents  . CATARACT EXTRACTION Bilateral 6 years ago  . CYSTOSCOPY N/A 08/06/2012   Procedure: CYSTOSCOPY;  Surgeon: [name], MD;  Location: [facility];  Service: Urology;  Laterality: N/A;  . ESOPHAGOGASTRIC FUNDOPLICATION  1740   spleen removed, 5 pints of blood given  . EYE SURGERY    . HERNIA REPAIR  1985   x4  . INGUINAL HERNIA REPAIR  1992  . PACEMAKER IMPLANT N/A 10/30/2018   Procedure: PACEMAKER IMPLANT;  Surgeon: Constance Haw, MD;  Location: [place] [facility];  Service: Cardiovascular;  Laterality: N/A;  . PACEMAKER INSERTION    . PPM GENERATOR REMOVAL  N/A 10/25/2018   Procedure: PPM GENERATOR REMOVAL;  Surgeon: Constance Haw, MD;  Location: [place] [facility];  Service: Cardiovascular;  Laterality: N/A;  . PROSTATE ABLATION  2013   "laser ablation"  . RETINAL DETACHMENT SURGERY Right 01/24/2019   Pneumatic cryopexy - Dr. Bernarda Caffey  . TEE WITHOUT CARDIOVERSION N/A 10/25/2018   Procedure: TRANSESOPHAGEAL ECHOCARDIOGRAM (TEE);  Surgeon: Acie Fredrickson Wonda Cheng, MD;  Location: [facility];  Service: Cardiovascular;  Laterality: N/A;  . TEMPORARY PACEMAKER N/A 10/25/2018   Procedure: TEMPORARY PACEMAKER;  Surgeon: Constance Haw, MD;  Location: [place] [facility];  Service: Cardiovascular;  Laterality: N/A;  . TONSILLECTOMY  as child  . TOTAL KNEE ARTHROPLASTY Right 08/03/2014   Procedure: TOTAL RIGHT KNEE ARTHROPLASTY;  Surgeon: [name], MD;  Location: [facility];  Service: Orthopedics;  Laterality: Right;  . TRANSURETHRAL RESECTION OF PROSTATE N/A 08/06/2012   Procedure: [place] Free OF PROSTATE WITH GYRUS ;  Surgeon: [name], MD;  Location: [facility];  Service: Urology;  Laterality: N/A;  . UMBILICAL HERNIA REPAIR  1990  . UVULECTOMY  2005   for sleep apnea  . UVULOPALATOPHARYNGOPLASTY  6 years ago   Family History  Problem Relation Age of Onset  . Heart attack Father 20       lived to 45  . Alzheimer's disease Mother   . Stroke Mother   . Breast cancer Mother   . Alzheimer's disease Maternal Grandmother   . Stroke Maternal Grandmother   . Lung cancer Maternal Grandmother   . Lung cancer Maternal Grandfather   . Cancer Paternal Grandmother        type unknown  . Diabetes Paternal Grandfather   . Other Paternal Grandfather        Growth in the back of the head   Social History   Socioeconomic History  . Marital status: Divorced    Spouse name: Not on file  . Number of children: 0  . Years of education: Not on file  . Highest education level: Not on file  Occupational History  . Occupation: Retired   Tobacco  Use  . Smoking status: Never Smoker  . Smokeless tobacco: Never Used  Substance and Sexual Activity  . Alcohol use: Yes    Alcohol/week: 4.0 standard drinks    Types: 4 Standard drinks or equivalent per week    Comment: 3 drinks at night   . Drug use: No  . Sexual activity: Not on file  Other Topics Concern  . Not on file  Social History Narrative  . Not on file   Social Determinants of Health   Financial Resource Strain: Low Risk   . Difficulty of Paying Living Expenses: Not hard at all  Food Insecurity: No Food Insecurity  . Worried About Charity fundraiser in the Last Year: Never true  . Ran Out of Food in the Last Year: Never true  Transportation Needs: No Transportation Needs  . Lack of Transportation (Medical): No  . Lack of Transportation (Non-Medical): No  Physical Activity: Sufficiently Active  . Days of Exercise per Week: 7 days  . Minutes of Exercise per Session: 30 min  Stress: No Stress Concern Present  . Feeling of Stress : Not at all  Social Connections: Socially Integrated  . Frequency of Communication with Friends and Family: More than three times a week  . Frequency of Social Gatherings with Friends and Family: More than three times a week  . Attends Religious Services: More than 4 times per year  . Active Member of Clubs or Organizations: Yes  . Attends Archivist Meetings: More than 4 times per year  . 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  no  Interpreter Needed?: No  Information entered by :: [employer]. [employer], LPN  Activities of Daily Living In your present state of health, do you have any difficulty performing the following activities: 02/10/2020  Hearing? N  Vision? N  Difficulty concentrating or making decisions? N  Walking or climbing stairs? N  Dressing or bathing? N  Doing errands, shopping? N  Preparing Food and eating ? N  Using the Toilet? N  In the past six months, have you accidently leaked urine? N  Do you have problems with loss of bowel control? N  Managing your Medications? N  Managing your Finances? N  Housekeeping or managing your Housekeeping? N  Some recent data might be hidden    Patient Care Team: [name], MD as PCP - General (Internal Medicine) [name], MD as PCP - Cardiology (Cardiology)  Indicate any recent Medical Services you may have received from other than [facility] providers in the past year (date may be approximate).     Assessment:   This is a routine wellness examination for [place].  Hearing/Vision screen No exam data present  Dietary issues and exercise activities discussed: Current Exercise Habits: Home exercise routine, Type of exercise: walking;strength training/weights;treadmill;stretching (golfing, boating, walking and jet skiing), Time (Minutes): 30, Frequency (Times/Week): 7, Weekly Exercise (Minutes/Week): 210, Intensity: Moderate, Exercise limited by: cardiac condition(s)  Goals    .  Patient Stated (pt-stated)      My goal is to lose 15 pounds.      Depression Screen PHQ 2/9 Scores 02/10/2020 12/22/2019 01/02/2019 06/27/2018 06/26/2017  PHQ - 2 Score 0 0 0 0 0    Fall Risk Fall Risk  02/10/2020 12/22/2019 01/02/2019 06/27/2018 06/26/2017  Falls in the past year? 0 0 - 0 No  Comment - - - - -  Number falls in past yr: 0 0 0 0 -  Injury with Fall? 0 0 0 0 -  Risk for fall due to : No Fall Risks No Fall Risks - Orthopedic patient -  Follow up Falls  evaluation completed;Education provided Falls evaluation completed - Falls evaluation completed;Education provided -    Any stairs in or around the home? Yes  If so, are there any without handrails? No  Home free of loose throw rugs in walkways, pet beds, electrical cords, etc? Yes  Adequate lighting in your home to reduce risk of falls? Yes   ASSISTIVE DEVICES UTILIZED TO PREVENT FALLS:  Life alert? No  Use of a cane, walker or w/c? No  Grab bars in the bathroom? No  Shower chair or bench in shower? No  Elevated toilet seat or a handicapped toilet? No   TIMED UP AND GO:  Was the test performed? No .  Length of time to ambulate 10 feet: 0 sec.   Gait steady and fast without use of assistive device  Cognitive Function:     6CIT Screen 02/10/2020  What Year? 0 points  What month? 0 points  What time? 0 points  Count back from 20 0 points  Months in reverse 0 points  Repeat phrase 0 points  Total Score 0    Immunizations Immunization History  Administered Date(s) Administered  . Fluad Quad(high Dose 65+) 04/01/2019  . Influenza, High Dose Seasonal PF 07/13/2016, 04/19/2017  . PFIZER SARS-COV-2 Vaccination 06/24/2019, 07/15/2019  . Pneumococcal Conjugate-13 06/26/2017  . Pneumococcal Polysaccharide-23 12/16/2015  . Tdap 06/26/2017  . Zoster Recombinat (Shingrix) 06/14/2019    TDAP status: Up to date Flu Vaccine status: Up to date Pneumococcal vaccine status: Up to date Covid-19 vaccine status:  Completed vaccines  Qualifies for Shingles Vaccine? Yes   Zostavax completed Yes   Shingrix Completed?: Yes  Screening Tests Health Maintenance  Topic Date Due  . Hepatitis C Screening  Never done  . INFLUENZA VACCINE  01/04/2020  . TETANUS/TDAP  06/27/2027  . COVID-19 Vaccine  Completed  . PNA vac Low Risk Adult  Completed    Health Maintenance  Health Maintenance Due  Topic Date Due  . Hepatitis C Screening  Never done  . INFLUENZA VACCINE  01/04/2020     Colorectal cancer screening: No longer required.   Lung Cancer Screening: (Low Dose CT Chest recommended if Age 63-80 years, 30 pack-year currently smoking OR have quit w/in 15years.) does not qualify.   Lung Cancer Screening Referral: no  Additional Screening:  Hepatitis C Screening: does not qualify; Completed no  Vision Screening: Recommended annual ophthalmology exams for early detection of glaucoma and other disorders of the eye. Is the patient up to date with their annual eye exam?  Yes  Who is the provider or what is the name of the office in which the patient attends annual eye exams? Dahlia Bailiff, MD at [facility] If pt is not established with a provider, would they like to be referred to a provider to establish care? No .   Dental Screening: Recommended annual dental exams for proper oral hygiene  Community Resource Referral / Chronic Care Management: CRR required this visit?  No   CCM required this visit?  No      Plan:     I have personally reviewed and noted the following in the patient's chart:   . Medical and social history . Use of alcohol, tobacco or illicit drugs  . Current medications and supplements . Functional ability and status . Nutritional status . Physical activity . Advanced directives . List of other physicians . Hospitalizations, surgeries, and ER visits in previous 12 months . Vitals . Screenings to include cognitive, depression, and falls . Referrals and appointments  In addition, I have reviewed and discussed with patient certain preventive protocols, quality metrics, and best practice recommendations. A written personalized care plan for preventive services as well as general preventive health recommendations were provided to patient.     Sheral Flow, LPN   01/11/2799   Nurse Notes: n/a

## 2020-02-10 NOTE — Patient Instructions (Signed)
[name] , Thank you for taking time to come for your Medicare Wellness Visit. I appreciate your ongoing commitment to your health goals. Please review the following plan we discussed and let me know if I can assist you in the future.   Screening recommendations/referrals: Colonoscopy: no repeat for age Recommended yearly ophthalmology/optometry visit for glaucoma screening and checkup Recommended yearly dental visit for hygiene and checkup  Vaccinations: Influenza vaccine:  Up to date Pneumococcal vaccine: Up to date Tdap vaccine: Up to date Shingles vaccine: Up to date   Covid-19: Up to date  Advanced directives: Please bring a copy of your health care power of attorney and living will to the office at your convenience.  Conditions/risks identified: Yes; Reviewed health maintenance screenings with patient today and relevant education, vaccines, and/or referrals were provided. Please continue to do your personal lifestyle choices by: daily care of teeth and gums, regular physical activity (goal should be 5 days a week for 30 minutes), eat a healthy diet, avoid tobacco and drug use, limiting any alcohol intake, taking a low-dose aspirin (if not allergic or have been advised by your provider otherwise) and taking vitamins and minerals as recommended by your provider. Continue doing brain stimulating activities (puzzles, reading, adult coloring books, staying active) to keep memory sharp. Continue to eat heart healthy diet (full of fruits, vegetables, whole grains, lean protein, water--limit salt, fat, and sugar intake) and increase physical activity as tolerated.  Next appointment: Please schedule your next Medicare Wellness Visit with your Nurse Health Advisor in 1 year.  Preventive Care 79 Years and Older, Male Preventive care refers to lifestyle choices and visits with your health care provider that can promote health and wellness. What does preventive care include?  A yearly physical  exam. This is also called an annual well check.  Dental exams once or twice a year.  Routine eye exams. Ask your health care provider how often you should have your eyes checked.  Personal lifestyle choices, including:  Daily care of your teeth and gums.  Regular physical activity.  Eating a healthy diet.  Avoiding tobacco and drug use.  Limiting alcohol use.  Practicing safe sex.  Taking low doses of aspirin every day.  Taking vitamin and mineral supplements as recommended by your health care provider. What happens during an annual well check? The services and screenings done by your health care provider during your annual well check will depend on your age, overall health, lifestyle risk factors, and family history of disease. Counseling  Your health care provider may ask you questions about your:  Alcohol use.  Tobacco use.  Drug use.  Emotional well-being.  Home and relationship well-being.  Sexual activity.  Eating habits.  History of falls.  Memory and ability to understand (cognition).  Work and work Statistician. Screening  You may have the following tests or measurements:  Height, weight, and BMI.  Blood pressure.  Lipid and cholesterol levels. These may be checked every 5 years, or more frequently if you are over 23 years old.  Skin check.  Lung cancer screening. You may have this screening every year starting at age 72 if you have a 30-pack-year history of smoking and currently smoke or have quit within the past 15 years.  Fecal occult blood test (FOBT) of the stool. You may have this test every year starting at age 82.  Flexible sigmoidoscopy or colonoscopy. You may have a sigmoidoscopy every 5 years or a colonoscopy every 10 years starting at  age 44.  Prostate cancer screening. Recommendations will vary depending on your family history and other risks.  Hepatitis C blood test.  Hepatitis B blood test.  Sexually transmitted disease  (STD) testing.  Diabetes screening. This is done by checking your blood sugar (glucose) after you have not eaten for a while (fasting). You may have this done every 1-3 years.  Abdominal aortic aneurysm (AAA) screening. You may need this if you are a current or former smoker.  Osteoporosis. You may be screened starting at age 40 if you are at high risk. Talk with your health care provider about your test results, treatment options, and if necessary, the need for more tests. Vaccines  Your health care provider may recommend certain vaccines, such as:  Influenza vaccine. This is recommended every year.  Tetanus, diphtheria, and acellular pertussis (Tdap, Td) vaccine. You may need a Td booster every 10 years.  Zoster vaccine. You may need this after age 70.  Pneumococcal 13-valent conjugate (PCV13) vaccine. One dose is recommended after age 6.  Pneumococcal polysaccharide (PPSV23) vaccine. One dose is recommended after age 27. Talk to your health care provider about which screenings and vaccines you need and how often you need them. This information is not intended to replace advice given to you by your health care provider. Make sure you discuss any questions you have with your health care provider. Document Released: 06/18/2015 Document Revised: 02/09/2016 Document Reviewed: 03/23/2015 Elsevier Interactive Patient Education  2017 [place] Prevention in the Home Falls can cause injuries. They can happen to people of all ages. There are many things you can do to make your home safe and to help prevent falls. What can I do on the outside of my home?  Regularly fix the edges of walkways and driveways and fix any cracks.  Remove anything that might make you trip as you walk through a door, such as a raised step or threshold.  Trim any bushes or trees on the path to your home.  Use bright outdoor lighting.  Clear any walking paths of anything that might make someone trip,  such as rocks or tools.  Regularly check to see if handrails are loose or broken. Make sure that both sides of any steps have handrails.  Any raised decks and porches should have guardrails on the edges.  Have any leaves, snow, or ice cleared regularly.  Use sand or salt on walking paths during winter.  Clean up any spills in your garage right away. This includes oil or grease spills. What can I do in the bathroom?  Use night lights.  Install grab bars by the toilet and in the tub and shower. Do not use towel bars as grab bars.  Use non-skid mats or decals in the tub or shower.  If you need to sit down in the shower, use a plastic, non-slip stool.  Keep the floor dry. Clean up any water that spills on the floor as soon as it happens.  Remove soap buildup in the tub or shower regularly.  Attach bath mats securely with double-sided non-slip rug tape.  Do not have throw rugs and other things on the floor that can make you trip. What can I do in the bedroom?  Use night lights.  Make sure that you have a light by your bed that is easy to reach.  Do not use any sheets or blankets that are too big for your bed. They should not hang down onto the  floor.  Have a firm chair that has side arms. You can use this for support while you get dressed.  Do not have throw rugs and other things on the floor that can make you trip. What can I do in the kitchen?  Clean up any spills right away.  Avoid walking on wet floors.  Keep items that you use a lot in easy-to-reach places.  If you need to reach something above you, use a strong step stool that has a grab bar.  Keep electrical cords out of the way.  Do not use floor polish or wax that makes floors slippery. If you must use wax, use non-skid floor wax.  Do not have throw rugs and other things on the floor that can make you trip. What can I do with my stairs?  Do not leave any items on the stairs.  Make sure that there are  handrails on both sides of the stairs and use them. Fix handrails that are broken or loose. Make sure that handrails are as long as the stairways.  Check any carpeting to make sure that it is firmly attached to the stairs. Fix any carpet that is loose or worn.  Avoid having throw rugs at the top or bottom of the stairs. If you do have throw rugs, attach them to the floor with carpet tape.  Make sure that you have a light switch at the top of the stairs and the bottom of the stairs. If you do not have them, ask someone to add them for you. What else can I do to help prevent falls?  Wear shoes that:  Do not have high heels.  Have rubber bottoms.  Are comfortable and fit you well.  Are closed at the toe. Do not wear sandals.  If you use a stepladder:  Make sure that it is fully opened. Do not climb a closed stepladder.  Make sure that both sides of the stepladder are locked into place.  Ask someone to hold it for you, if possible.  Clearly mark and make sure that you can see:  Any grab bars or handrails.  First and last steps.  Where the edge of each step is.  Use tools that help you move around (mobility aids) if they are needed. These include:  Canes.  Walkers.  Scooters.  Crutches.  Turn on the lights when you go into a dark area. Replace any light bulbs as soon as they burn out.  Set up your furniture so you have a clear path. Avoid moving your furniture around.  If any of your floors are uneven, fix them.  If there are any pets around you, be aware of where they are.  Review your medicines with your doctor. Some medicines can make you feel dizzy. This can increase your chance of falling. Ask your doctor what other things that you can do to help prevent falls. This information is not intended to replace advice given to you by your health care provider. Make sure you discuss any questions you have with your health care provider. Document Released: 03/18/2009  Document Revised: 10/28/2015 Document Reviewed: 06/26/2014 Elsevier Interactive Patient Education  2017 [employer].

## 2020-02-10 NOTE — Progress Notes (Signed)
Full PFT completed today ? ?

## 2020-02-16 NOTE — Progress Notes (Signed)
Patient Care Team: [name], MD as PCP - General (Internal Medicine) [name], MD as PCP - Cardiology (Cardiology) Lavonna Monarch, MD as Consulting Physician (Dermatology)   HPI  [name] Glass is a 79 y.o. male Seen folloWUp for pacer-Medtronic implanted in [place] 1/20 for sinus node dysfunction.  He has a history of syncope.  Presented 5/20 with fever and sepsis.  He had strep bacteremia.  He underwent pacemaker removal PICC line placement and pacemaker generator reimplantation  History of coronary artery disease with multiple prior stents. During syncopal episode in [place], he underwent atherectomy of the LAD and DES stenting.  Complains of shortness of breath.  Not at rest.  But even with just minimal exertion.  Has some edema.  No chest discomfort.  Has seen pulmonary.  Sleep study is pending.  PFTs are done but not interpreted.  Chest x-rays were reviewed with the patient.  Dating back to 2006 has substernal area and some diaphragmatic flattening    Date Cr K TSH Hgb  7/21 1.11 4.5 0.07 (dose decreased) 16.3           DATE TEST EF   3/19 Myoview 50% No perfusion Defects  5/20 Echo  50-55 %   5-20 TEE  MR mod       Records and Results Reviewed   Past Medical History:  Diagnosis Date  . Arthritis   . Coronary artery disease   . GERD (gastroesophageal reflux disease)   . H/O bronchitis   . H/O hiatal hernia   . History of kidney stones last in 1976  . Hyperlipidemia    statin intolerant, under control  . Hypertension    under control  . Hypertensive retinopathy    OU  . Hypothyroidism   . Moderate persistent asthma without complication 07/12/8339  . Retinal detachment    OD  . 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   left helix tx cx3 66fu  . Sleep apnea    hx of, surgery to reverse    Past Surgical History:  Procedure Laterality Date  . CARDIAC CATHETERIZATION Right 2008   5 heart stents  . CATARACT EXTRACTION  Bilateral 6 years ago  . CYSTOSCOPY N/A 08/06/2012   Procedure: CYSTOSCOPY;  Surgeon: [name], MD;  Location: [facility];  Service: Urology;  Laterality: N/A;  . ESOPHAGOGASTRIC FUNDOPLICATION  9622   spleen removed, 5 pints of blood given  . EYE SURGERY    . HERNIA REPAIR  1985   x4  . INGUINAL HERNIA REPAIR  1992  . PACEMAKER IMPLANT N/A 10/30/2018   Procedure: PACEMAKER IMPLANT;  Surgeon: Constance Haw, MD;  Location: [facility];  Service: Cardiovascular;  Laterality: N/A;  . PACEMAKER INSERTION    . PPM GENERATOR REMOVAL N/A 10/25/2018   Procedure: PPM GENERATOR REMOVAL;  Surgeon: Constance Haw, MD;  Location: [facility];  Service: Cardiovascular;  Laterality: N/A;  . PROSTATE ABLATION  2013   "laser ablation"  . RETINAL DETACHMENT SURGERY Right 01/24/2019   Pneumatic cryopexy - Dr. Bernarda Caffey  . TEE WITHOUT CARDIOVERSION N/A 10/25/2018   Procedure: TRANSESOPHAGEAL ECHOCARDIOGRAM (TEE);  Surgeon: Acie Fredrickson Wonda Cheng, MD;  Location: [facility];  Service: Cardiovascular;  Laterality: N/A;  . TEMPORARY PACEMAKER N/A 10/25/2018   Procedure: TEMPORARY PACEMAKER;  Surgeon: Constance Haw, MD;  Location: [facility];  Service: Cardiovascular;  Laterality: N/A;  . TONSILLECTOMY  as child  .  TOTAL KNEE ARTHROPLASTY Right 08/03/2014   Procedure: TOTAL RIGHT KNEE ARTHROPLASTY;  Surgeon: [name], MD;  Location: [facility];  Service: Orthopedics;  Laterality: Right;  . TRANSURETHRAL RESECTION OF PROSTATE N/A 08/06/2012   Procedure: Almyra Free OF PROSTATE WITH GYRUS ;  Surgeon: [name], MD;  Location: [facility];  Service: Urology;  Laterality: N/A;  . UMBILICAL HERNIA REPAIR  1990  . UVULECTOMY  2005   for sleep apnea  . UVULOPALATOPHARYNGOPLASTY  6 years ago    Current Meds  Medication Sig  . acyclovir (ZOVIRAX) 200 MG capsule TAKE 1 CAPSULE BY MOUTH THREE TIMES A DAY  . anastrozole (ARIMIDEX) 1 MG tablet TAKE 1 TABLET (1 MG TOTAL) BY MOUTH 2  (TWO) TIMES A WEEK.  [name] ARMOUR THYROID 60 MG tablet TAKE 2 TABLETS BY MOUTH EVERY DAY  . aspirin EC 81 MG tablet Take 1 tablet (81 mg total) by mouth daily.  [name] ezetimibe (ZETIA) 10 MG tablet TAKE 1 TABLET BY MOUTH EVERY DAY  . fluticasone (FLONASE) 50 MCG/ACT nasal spray SPRAY 2 SPRAYS INTO EACH NOSTRIL EVERY DAY  . hydrALAZINE (APRESOLINE) 25 MG tablet TAKE 1 TABLET BY MOUTH THREE TIMES A DAY  . ipratropium (ATROVENT) 0.03 % nasal spray Place 2 sprays into both nostrils 3 (three) times daily as needed for rhinitis.  [name] liothyronine (CYTOMEL) 25 MCG tablet Take 1 tablet (25 mcg total) by mouth every other day.  [name] LIVALO 4 MG TABS TAKE 1 TABLET BY MOUTH EVERY DAY  . montelukast (SINGULAIR) 10 MG tablet TAKE 1 TABLET BY MOUTH EVERYDAY AT BEDTIME  . Nandrolone Decanoate 200 MG/ML OIL Inject 100 mg as directed once a week.  . Potassium Gluconate 2.5 MEQ TABS Take by mouth.  . Somatropin (OMNITROPE) 5 MG/1.5ML SOCT Inject 0.15 mLs into the skin daily. Add 1.14 mL of sterile diluent to each vial of omnitrope. Inject Monday thru Friday qhs.  . SYRINGE-NEEDLE, DISP, 3 ML 23G X 1" 3 ML MISC Use to inject testosterone as prescribed.  . tamsulosin (FLOMAX) 0.4 MG CAPS capsule TAKE 1 CAPSULE BY MOUTH EVERY DAY IN THE MORNING    Allergies  Allergen Reactions  . Morphine And Related     Had "crazy dreams" felt "crazy" per pt.  . Bactrim [Sulfamethoxazole-Trimethoprim]   . Statins       Review of Systems negative except from HPI and PMH  Physical Exam BP (!) 142/80   Pulse 60   Ht 6' (1.829 m)   Wt 217 lb (98.4 kg)   SpO2 96%   BMI 29.43 kg/m  Well developed and well nourished in no acute distress HENT normal Neck supple with JVP-8-9 cm  clear Device pocket well healed; without hematoma or erythema.  There is no tethering  Regular rate and rhythm, no gallop 3/6 murmur Abd-soft with active BS No Clubbing cyanosis 1+ edema Skin-warm and dry A & Oriented  Grossly normal sensory and motor  function  ECG atrial paced at 60 Low 17/11/45 Axis left -40    Assessment and  Plan  Sinus node dysfunction  Syncope  Ischemic heart disease with prior stenting  Dyspnea on exertion-acute/chronic?  HFpEF  Mitral regurgitation  Pacemaker-Medtronic  Explant and reimplant following strep infection 5/20    Device function is normal.  Chronotropic competence is adequate.  Dyspnea is problematic.  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  Reviewed with patient.  Review of his echocardiogram from 2019 and then 5/20 demonstrates interval development of MR which  is at least moderate by TEE.  Loud murmur present today.  We will repeat the echocardiogram.  May needs stress echo.  Doubt this is ischemic--with complex coronary disease this needs to be considered  Volume overload at least on the right.  We will check a BNP and put him on a short Lasix trial x5 days.  No interval syncope.       Current medicines are reviewed at length with the patient today .  The patient does not  have concerns regarding medicines.

## 2020-02-17 ENCOUNTER — Telehealth: Payer: Self-pay

## 2020-02-17 ENCOUNTER — Ambulatory Visit (INDEPENDENT_AMBULATORY_CARE_PROVIDER_SITE_OTHER): Payer: Medicare Other | Admitting: Internal Medicine

## 2020-02-17 ENCOUNTER — Ambulatory Visit (INDEPENDENT_AMBULATORY_CARE_PROVIDER_SITE_OTHER): Payer: Medicare Other | Admitting: Dermatology

## 2020-02-17 ENCOUNTER — Encounter: Payer: Self-pay | Admitting: Dermatology

## 2020-02-17 ENCOUNTER — Other Ambulatory Visit: Payer: Self-pay

## 2020-02-17 ENCOUNTER — Encounter: Payer: Self-pay | Admitting: Internal Medicine

## 2020-02-17 VITALS — BP 142/80 | HR 60 | Ht 72.0 in | Wt 217.0 lb

## 2020-02-17 DIAGNOSIS — I340 Nonrheumatic mitral (valve) insufficiency: Secondary | ICD-10-CM

## 2020-02-17 DIAGNOSIS — C4491 Basal cell carcinoma of skin, unspecified: Secondary | ICD-10-CM

## 2020-02-17 DIAGNOSIS — R609 Edema, unspecified: Secondary | ICD-10-CM

## 2020-02-17 DIAGNOSIS — C44319 Basal cell carcinoma of skin of other parts of face: Secondary | ICD-10-CM | POA: Diagnosis not present

## 2020-02-17 DIAGNOSIS — I495 Sick sinus syndrome: Secondary | ICD-10-CM

## 2020-02-17 DIAGNOSIS — I2510 Atherosclerotic heart disease of native coronary artery without angina pectoris: Secondary | ICD-10-CM | POA: Diagnosis not present

## 2020-02-17 DIAGNOSIS — R0602 Shortness of breath: Secondary | ICD-10-CM | POA: Diagnosis not present

## 2020-02-17 DIAGNOSIS — D485 Neoplasm of uncertain behavior of skin: Secondary | ICD-10-CM | POA: Diagnosis not present

## 2020-02-17 DIAGNOSIS — Z950 Presence of cardiac pacemaker: Secondary | ICD-10-CM | POA: Diagnosis not present

## 2020-02-17 DIAGNOSIS — L570 Actinic keratosis: Secondary | ICD-10-CM

## 2020-02-17 DIAGNOSIS — G4733 Obstructive sleep apnea (adult) (pediatric): Secondary | ICD-10-CM | POA: Diagnosis not present

## 2020-02-17 HISTORY — DX: Basal cell carcinoma of skin, unspecified: C44.91

## 2020-02-17 MED ORDER — FUROSEMIDE 20 MG PO TABS
20.0000 mg | ORAL_TABLET | Freq: Every day | ORAL | 0 refills | Status: DC
Start: 2020-02-17 — End: 2020-03-22

## 2020-02-17 NOTE — Patient Instructions (Signed)
Medication Instructions:   ** Begin Lasix 20mg  - 1 tablet by mouth daily for 5 days  *If you need a refill on your cardiac medications before your next appointment, please call your pharmacy*   Lab Work: BNP today  If you have labs (blood work) drawn today and your tests are completely normal, you will receive your results only by: [name] MyChart Message (if you have MyChart) OR . A paper copy in the mail If you have any lab test that is abnormal or we need to change your treatment, we will call you to review the results.   Testing/Procedures: Your physician has requested that you have an echocardiogram. Echocardiography is a painless test that uses sound waves to create images of your heart. It provides your doctor with information about the size and shape of your heart and how well your heart's chambers and valves are working. This procedure takes approximately one hour. There are no restrictions for this procedure.    Follow-Up: At [facility], you and your health needs are our priority.  As part of our continuing mission to provide you with exceptional heart care, we have created designated Provider Care Teams.  These Care Teams include your primary Cardiologist (physician) and Advanced Practice Providers (APPs -  Physician Assistants and Nurse Practitioners) who all work together to provide you with the care you need, when you need it.  We recommend signing up for the patient portal called "MyChart".  Sign up information is provided on this After Visit Summary.  MyChart is used to connect with patients for Virtual Visits (Telemedicine).  Patients are able to view lab/test results, encounter notes, upcoming appointments, etc.  Non-urgent messages can be sent to your provider as well.   To learn more about what you can do with MyChart, go to [email or web address].    Your next appointment:   12 month(s)  The format for your next appointment:   In Person  Provider:   Virl Axe,  MD

## 2020-02-17 NOTE — Telephone Encounter (Signed)
Called patient left message on personal voice mail Dr.Berry wants to see you next month.Appointment scheduled with Dr.Berry 10/8 at 11:30 am.Advised to call back if you need to reschedule.

## 2020-02-17 NOTE — Patient Instructions (Addendum)
Routine follow up after using fluorouracil on arms for one month. Overall much improvement but enough residual to consider repeat use on arms and left ear in January 2022. Two pearly knots on upper forehead treated with shave biopsy to rule out skin cancer; please call the office in three days to discuss the results  .Biopsy, Surgery (Curettage) & Surgery (Excision) Aftercare Instructions  1. Okay to remove bandage in 24 hours  2. Wash area with soap and water  3. Apply Vaseline to area twice daily until healed (Not Neosporin)  4. Okay to cover with a Band-Aid to decrease the chance of infection or prevent irritation from clothing; also it's okay to uncover lesion at home.  5. Suture instructions: return to our office in 7-10 or 10-14 days for a nurse visit for suture removal. Variable healing with sutures, if pain or itching occurs call our office. It's okay to shower or bathe 24 hours after sutures are given.  6. The following risks may occur after a biopsy, curettage or excision: bleeding, scarring, discoloration, recurrence, infection (redness, yellow drainage, pain or swelling).  7. For questions, concerns and results call our office at [place] before 4pm & Friday before 3pm. Biopsy results will be available in 1 week.

## 2020-02-18 LAB — PRO B NATRIURETIC PEPTIDE: NT-Pro BNP: 424 pg/mL (ref 0–486)

## 2020-02-20 ENCOUNTER — Telehealth: Payer: Self-pay | Admitting: *Deleted

## 2020-02-20 ENCOUNTER — Encounter: Payer: Self-pay | Admitting: *Deleted

## 2020-02-20 NOTE — Telephone Encounter (Signed)
-----   Message from Lavonna Monarch, MD sent at 02/19/2020  8:57 PM EDT ----- Dr. Denna Haggard recommends that this be removed with Mohs surgery.

## 2020-02-20 NOTE — Telephone Encounter (Signed)
Pathology to patient, referral sent to [facility]/ skin surgery center.

## 2020-03-02 ENCOUNTER — Other Ambulatory Visit: Payer: Self-pay | Admitting: Internal Medicine

## 2020-03-02 ENCOUNTER — Ambulatory Visit (HOSPITAL_COMMUNITY): Payer: Medicare Other | Attending: Cardiology

## 2020-03-02 ENCOUNTER — Other Ambulatory Visit: Payer: Self-pay

## 2020-03-02 DIAGNOSIS — E291 Testicular hypofunction: Secondary | ICD-10-CM

## 2020-03-02 DIAGNOSIS — I340 Nonrheumatic mitral (valve) insufficiency: Secondary | ICD-10-CM | POA: Insufficient documentation

## 2020-03-02 LAB — ECHOCARDIOGRAM COMPLETE
AR max vel: 2.61 cm2
AV Area VTI: 2.83 cm2
AV Area mean vel: 2.77 cm2
AV Mean grad: 20 mmHg
AV Peak grad: 39.9 mmHg
Ao pk vel: 3.16 m/s
Area-P 1/2: 1.39 cm2
P 1/2 time: 560 msec
S' Lateral: 2.7 cm

## 2020-03-04 ENCOUNTER — Telehealth: Payer: Self-pay

## 2020-03-04 NOTE — Telephone Encounter (Signed)
-----   Message from Deboraha Sprang, MD sent at 03/02/2020 10:11 PM EDT ----- Please Inform Patient that labs are normal    this suggests taht SOB is not cardiac and perhaps pulm eval would be appropriate and he can discuss this with his PCP Thanks

## 2020-03-04 NOTE — Telephone Encounter (Signed)
Spoke with pt and advised labs are normal and this would suggest the SOB is not cardiac but rather pulmonary and to discuss further testing with your PCP.  Pt states he has pulmonary testing and does not have results yet but will continue follow up with his PCP.  Pt thanked Therapist, sports for phone call.

## 2020-03-04 NOTE — Telephone Encounter (Signed)
Spoke with pt and advised per Dr Caryl Comes echo shows normal heart muscle function with some narrowing of aortic valve.  Not serious at this time and will continue to follow.  Pt verbalizes understanding and thanked Therapist, sports for call.

## 2020-03-04 NOTE — Telephone Encounter (Signed)
-----   Message from Deboraha Sprang, MD sent at 03/02/2020 10:05 PM EDT ----- Please Inform Patient Echo showed  normal # heart muscle function; aortic valve with some narrowing but not currently serious  will need to be followed

## 2020-03-06 ENCOUNTER — Telehealth: Payer: Self-pay | Admitting: Pulmonary Disease

## 2020-03-06 NOTE — Telephone Encounter (Signed)
Please let patient know he has moderate obstruction on his pulmonary function tests concerning for COPD. Please send in a prescription for anoro ellipta.   Thanks, Wille Glaser

## 2020-03-09 DIAGNOSIS — Z23 Encounter for immunization: Secondary | ICD-10-CM | POA: Diagnosis not present

## 2020-03-12 ENCOUNTER — Encounter: Payer: Self-pay | Admitting: Cardiovascular Disease

## 2020-03-12 ENCOUNTER — Ambulatory Visit (INDEPENDENT_AMBULATORY_CARE_PROVIDER_SITE_OTHER): Payer: Medicare Other | Admitting: Cardiovascular Disease

## 2020-03-12 ENCOUNTER — Other Ambulatory Visit: Payer: Self-pay | Admitting: *Deleted

## 2020-03-12 ENCOUNTER — Other Ambulatory Visit: Payer: Self-pay

## 2020-03-12 DIAGNOSIS — E785 Hyperlipidemia, unspecified: Secondary | ICD-10-CM | POA: Diagnosis not present

## 2020-03-12 DIAGNOSIS — Z950 Presence of cardiac pacemaker: Secondary | ICD-10-CM | POA: Diagnosis not present

## 2020-03-12 DIAGNOSIS — I10 Essential (primary) hypertension: Secondary | ICD-10-CM | POA: Diagnosis not present

## 2020-03-12 DIAGNOSIS — I2510 Atherosclerotic heart disease of native coronary artery without angina pectoris: Secondary | ICD-10-CM

## 2020-03-12 DIAGNOSIS — R0609 Other forms of dyspnea: Secondary | ICD-10-CM

## 2020-03-12 DIAGNOSIS — I712 Thoracic aortic aneurysm, without rupture, unspecified: Secondary | ICD-10-CM

## 2020-03-12 DIAGNOSIS — R0600 Dyspnea, unspecified: Secondary | ICD-10-CM

## 2020-03-12 DIAGNOSIS — G4730 Sleep apnea, unspecified: Secondary | ICD-10-CM

## 2020-03-12 NOTE — Patient Instructions (Signed)
  Follow-Up: At [facility], you and your health needs are our priority.  As part of our continuing mission to provide you with exceptional heart care, we have created designated Provider Care Teams.  These Care Teams include your primary Cardiologist (physician) and Advanced Practice Providers (APPs -  Physician Assistants and Nurse Practitioners) who all work together to provide you with the care you need, when you need it.  We recommend signing up for the patient portal called "MyChart".  Sign up information is provided on this After Visit Summary.  MyChart is used to connect with patients for Virtual Visits (Telemedicine).  Patients are able to view lab/test results, encounter notes, upcoming appointments, etc.  Non-urgent messages can be sent to your provider as well.   To learn more about what you can do with MyChart, go to [email or web address].    Your next appointment:   6 month(s)  The format for your next appointment:   In Person  Provider:   Quay Burow, MD
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
## 2020-03-12 NOTE — Assessment & Plan Note (Signed)
History of hyperlipidemia on Livalo and Zetia with lipid profile performed 12/22/2019 revealing total cholesterol 154, LDL of 80 and HDL 46.

## 2020-03-12 NOTE — Assessment & Plan Note (Signed)
History of obstructive sleep apnea currently being worked up.

## 2020-03-12 NOTE — Assessment & Plan Note (Signed)
History of cardiac pacemaker for sinus node dysfunction, explanted because of sepsis with reimplantation followed by Dr. Caryl Comes who saw him 02/17/2020.  His pacer was functioning normally.

## 2020-03-12 NOTE — Assessment & Plan Note (Signed)
History of essential hypertension a blood pressure measured today at 144/72.  He is on hydralazine but no other antihypertensive medications.

## 2020-03-12 NOTE — Assessment & Plan Note (Signed)
History of dyspnea on exertion with chronic asthma.  He did have a recent echo ordered by Dr. Caryl Comes on 02/23/2020 revealing normal LV systolic function with trivial MR.  He had pulmonary function tests as well.  This is been worked up by the pulmonologist.  At this point, I do not feel inclined to order a stress test since his symptoms do not sound ischemic.

## 2020-03-12 NOTE — Assessment & Plan Note (Signed)
History of thoracic aortic aneurysm recently demonstrated by 2D echo performed 03/02/2020 measuring 45 mm.  This will be rechecked in 1 year

## 2020-03-12 NOTE — Assessment & Plan Note (Signed)
History of CAD status post RCA intervention by myself 06/09/2004.  He had staged LAD atherectomy 1/20 and [place].  That time the circumflex and RCA stents were demonstrated to be patent.  He was placed on Brilinta which was stopped subsequently because of dyspnea and transition to Plavix which has been discontinued because of bleeding.  He denies chest pain.

## 2020-03-14 ENCOUNTER — Encounter: Payer: Self-pay | Admitting: Dermatology

## 2020-03-14 ENCOUNTER — Other Ambulatory Visit: Payer: Self-pay | Admitting: Internal Medicine

## 2020-03-14 DIAGNOSIS — E039 Hypothyroidism, unspecified: Secondary | ICD-10-CM

## 2020-03-14 NOTE — Progress Notes (Signed)
   Follow-Up Visit   Subjective  [name] Glass is a 79 y.o. male who presents for the following: Follow-up (5 FU ON RIGHT ARM AND LEFT AND RIGHT EAR RIM, LEFT KNEE).  New growths Location:  Duration:  Quality:  Associated Signs/Symptoms: Modifying Factors:  Severity:  Timing: Context: History of multiple skin cancers  Objective  Well appearing patient in no apparent distress; mood and affect are within normal limits.  All skin waist up examined.   Assessment & Plan    Neoplasm of uncertain behavior of skin (2) Right Forehead  Skin / nail biopsy Type of biopsy: tangential   Informed consent: discussed and consent obtained   Timeout: patient name, date of birth, surgical site, and procedure verified   Procedure prep:  Patient was prepped and draped in usual sterile fashion Prep type:  Chlorhexidine Anesthesia: the lesion was anesthetized in a standard fashion   Anesthetic:  1% lidocaine w/ epinephrine 1-100,000 local infiltration Instrument used: flexible razor blade   Hemostasis achieved with: ferric subsulfate   Outcome: patient tolerated procedure well   Post-procedure details: wound care instructions given    Specimen 1 - Surgical pathology Differential Diagnosis: BCC SCC Check Margins: No  Left Forehead  Skin / nail biopsy Type of biopsy: tangential   Informed consent: discussed and consent obtained   Timeout: patient name, date of birth, surgical site, and procedure verified   Procedure prep:  Patient was prepped and draped in usual sterile fashion Prep type:  Chlorhexidine Anesthesia: the lesion was anesthetized in a standard fashion   Anesthetic:  1% lidocaine w/ epinephrine 1-100,000 local infiltration Instrument used: flexible razor blade   Hemostasis achieved with: ferric subsulfate   Outcome: patient tolerated procedure well   Post-procedure details: wound care instructions given    Specimen 2 - Surgical pathology Differential Diagnosis: BCC  SCC Check Margins: No  AK (actinic keratosis) (3) Left Ear; Left Forearm - Posterior; Right Forearm - Posterior  Repeat topical fluorouracil in January, 28 total applications unless develops too much irritation.    Routine follow up after using fluorouracil on arms for one month. Overall much improvement but enough residual to consider repeat use on arms and left ear in January 2022. Two pearly knots on upper forehead treated with shave biopsy to rule out skin cancer; please call the office in three days to discuss the results I, Lavonna Monarch, MD, have reviewed all documentation for this visit.  The documentation on 03/14/20 for the exam, diagnosis, procedures, and orders are all accurate and complete.

## 2020-03-22 ENCOUNTER — Other Ambulatory Visit: Payer: Self-pay

## 2020-03-22 ENCOUNTER — Ambulatory Visit (INDEPENDENT_AMBULATORY_CARE_PROVIDER_SITE_OTHER): Payer: Medicare Other | Admitting: Pulmonary Disease

## 2020-03-22 ENCOUNTER — Encounter: Payer: Self-pay | Admitting: Pulmonary Disease

## 2020-03-22 VITALS — BP 122/60 | HR 60 | Temp 97.3°F | Ht 72.0 in | Wt 216.4 lb

## 2020-03-22 DIAGNOSIS — G4733 Obstructive sleep apnea (adult) (pediatric): Secondary | ICD-10-CM

## 2020-03-22 DIAGNOSIS — J310 Chronic rhinitis: Secondary | ICD-10-CM

## 2020-03-22 DIAGNOSIS — Z23 Encounter for immunization: Secondary | ICD-10-CM

## 2020-03-22 DIAGNOSIS — J449 Chronic obstructive pulmonary disease, unspecified: Secondary | ICD-10-CM

## 2020-03-22 MED ORDER — ANORO ELLIPTA 62.5-25 MCG/INH IN AEPB
1.0000 | INHALATION_SPRAY | Freq: Every day | RESPIRATORY_TRACT | 11 refills | Status: DC
Start: 2020-03-22 — End: 2021-03-24

## 2020-03-22 NOTE — Progress Notes (Signed)
Patient seen in the office today and instructed on use of Anoro Ellipta.  Patient expressed understanding and demonstrated technique.  

## 2020-03-22 NOTE — Patient Instructions (Addendum)
Start anoro ellipta 1 inhalation daily We will order you for a CPAP machine with nasal pillow and refer you to sleep medicine clinic We will refer you to ENT for further evaluation of your sinuses and post-nasal drip Start flutter valve therapy at least twice daily for chest congestion and secretion clearance.

## 2020-03-22 NOTE — Progress Notes (Signed)
Synopsis: Follow up for shortness of breath  Subjective:   PATIENT ID: [name] Glass: male DOB: September 18, 1940, MRN: [id number]   HPI  Chief Complaint  Patient presents with  . Follow-up    shortness of breath with activity and when he bends over    [name] Glass is a 79 year old male with coronary artery disease status post stent in 2020, sick sinus syndrome s/p pacemaker, hypertension, allergic rhinitis, GERD and obstructive sleep apnea not on CPAP who returns to pulmonary clinic for evaluation of shortness of breath and concern for chronic bronchitis.   He was last seen in clinic on 01/20/20. He has had pulmonary function tests which show mild obstructive lung disease with mild degree of air trapping with otherwise normal diffusion capacity and total lung capacity. His home sleep study on 02/11/20 shows severe obstructive sleep apnea with an AHI of 49.4/hr.  He was started on ipratropium nasal spray for chronic sinus drainage with out much improvement in post-nasal drip symptoms and cough.      From visit on 01/20/20  He reports over the past year he has had progressive shortness of breath that is now limiting his daily activities and social activities. The shortness of breath is with exertion. 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 Echo on 10/25/2018 reviewed showing normal EF 55-60%. No diastolic dysfunction noted.   He has history of sinusitis and chronic rhinitis in which he takes fluticasone nasal spray. He has had sinus/septal surgery in the past. He complains of post-nasal drip and runny nose. He does not note sinus congestion. He also has history of moderate severe obstructive sleep apnea with an AHI of 37 based on a sleep study from 05/30/2005. He had uvula surgery to aid in the severity of the sleep apnea as he did not  tolerate the CPAP mask. He does not report significant improvement in his sleep since the surgery but it has cut down on his snoring some.   He does report cough, more so in the morning with production of yellowish sputum. After he clears his chest in the morning he does not report having cough or wheezing throughout the day. He was placed on nebulizer treatments in the past which did not provide any relief.      Chest radiograph 06/09/2019 reviewed: signs of hyperinflation with flattened diaphragms and increased retrosternal airspace.   CTA Chest 10/22/2018 reviewed: lung parenchyma appears normal as well as the airways. The trachea has a slight saber sheath appearance.  Past Medical History:  Diagnosis Date  . Arthritis   . Basal cell carcinoma 02/17/2020   infl & ulcerated-Left forehead ([facility])  . Coronary artery disease   . GERD (gastroesophageal reflux disease)   . H/O bronchitis   . H/O hiatal hernia   . History of kidney stones last in 1976  . Hyperlipidemia    statin intolerant, under control  . Hypertension    under control  . Hypertensive retinopathy    OU  . Hypothyroidism   . Moderate persistent asthma without complication 07/09/4008  . Retinal detachment    OD  . 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   left helix tx [id number]  . Sleep apnea    hx of, surgery to reverse     Family History  Problem Relation Age of Onset  . Heart attack  Father 89       lived to 70  . Alzheimer's disease Mother   . Stroke Mother   . Breast cancer Mother   . Alzheimer's disease Maternal Grandmother   . Stroke Maternal Grandmother   . Lung cancer Maternal Grandmother   . Lung cancer Maternal Grandfather   . Cancer Paternal Grandmother        type unknown  . Diabetes Paternal Grandfather   . Other Paternal Grandfather        Growth in the back of the head     Social History   Socioeconomic History  . Marital status: Divorced    Spouse name: Not on file  . Number  of children: 0  . Years of education: Not on file  . Highest education level: Not on file  Occupational History  . Occupation: Retired   Tobacco Use  . Smoking status: Never Smoker  . Smokeless tobacco: Never Used  Vaping Use  . Vaping Use: Never used  Substance and Sexual Activity  . Alcohol use: Yes    Alcohol/week: 4.0 standard drinks    Types: 4 Standard drinks or equivalent per week    Comment: 3 drinks at night   . Drug use: No  . Sexual activity: Not on file  Other Topics Concern  . Not on file  Social History Narrative  . Not on file   Social Determinants of Health   Financial Resource Strain: Low Risk   . Difficulty of Paying Living Expenses: Not hard at all  Food Insecurity: No Food Insecurity  . Worried About Charity fundraiser in the Last Year: Never true  . Ran Out of Food in the Last Year: Never true  Transportation Needs: No Transportation Needs  . Lack of Transportation (Medical): No  . Lack of Transportation (Non-Medical): No  Physical Activity: Sufficiently Active  . Days of Exercise per Week: 7 days  . Minutes of Exercise per Session: 30 min  Stress: No Stress Concern Present  . Feeling of Stress : Not at all  Social Connections: Socially Integrated  . Frequency of Communication with Friends and Family: More than three times a week  . Frequency of Social Gatherings with Friends and Family: More than three times a week  . Attends Religious Services: More than 4 times per year  . Active Member of Clubs or Organizations: Yes  . Attends Archivist Meetings: More than 4 times per year  . Marital Status: Married  Human resources officer Violence: Not At Risk  . Fear of Current or Ex-Partner: No  . Emotionally Abused: No  . Physically Abused: No  . Sexually Abused: No     Allergies  Allergen Reactions  . Morphine And Related     Had "crazy dreams" felt "crazy" per pt.  . Bactrim [Sulfamethoxazole-Trimethoprim]   . Statins      Outpatient  Medications Prior to Visit  Medication Sig Dispense Refill  . acyclovir (ZOVIRAX) 200 MG capsule TAKE 1 CAPSULE BY MOUTH THREE TIMES A DAY 270 capsule 0  . anastrozole (ARIMIDEX) 1 MG tablet TAKE 1 TABLET (1 MG TOTAL) BY MOUTH 2 (TWO) TIMES A WEEK. 24 tablet 1  . ARMOUR THYROID 60 MG tablet TAKE 2 TABLETS BY MOUTH EVERY DAY 180 tablet 0  . aspirin EC 81 MG tablet Take 1 tablet (81 mg total) by mouth daily. 90 tablet 3  . ezetimibe (ZETIA) 10 MG tablet TAKE 1 TABLET BY MOUTH EVERY DAY 90 tablet  1  . fluticasone (FLONASE) 50 MCG/ACT nasal spray SPRAY 2 SPRAYS INTO EACH NOSTRIL EVERY DAY 48 mL 1  . hydrALAZINE (APRESOLINE) 25 MG tablet TAKE 1 TABLET BY MOUTH THREE TIMES A DAY 270 tablet 1  . ipratropium (ATROVENT) 0.03 % nasal spray Place 2 sprays into both nostrils 3 (three) times daily as needed for rhinitis. 30 mL 12  . liothyronine (CYTOMEL) 25 MCG tablet Take 1 tablet (25 mcg total) by mouth every other day. 45 tablet 0  . LIVALO 4 MG TABS TAKE 1 TABLET BY MOUTH EVERY DAY 90 tablet 1  . montelukast (SINGULAIR) 10 MG tablet TAKE 1 TABLET BY MOUTH EVERYDAY AT BEDTIME 90 tablet 1  . Nandrolone Decanoate 200 MG/ML OIL Inject 100 mg as directed once a week. 10 mL 1  . Potassium Gluconate 2.5 MEQ TABS Take by mouth.    . Somatropin (OMNITROPE) 5 MG/1.5ML SOCT Inject 0.15 mLs into the skin daily. Add 1.14 mL of sterile diluent to each vial of omnitrope. Inject Monday thru Friday qhs. 10 mL 1  . SYRINGE-NEEDLE, DISP, 3 ML 23G X 1" 3 ML MISC Use to inject testosterone as prescribed. 50 each 0  . tamsulosin (FLOMAX) 0.4 MG CAPS capsule TAKE 1 CAPSULE BY MOUTH EVERY DAY IN THE MORNING 90 capsule 1  . testosterone cypionate (DEPOTESTOSTERONE CYPIONATE) 200 MG/ML injection Inject 1.25 mls (250 mg total) into the muscle every 7 (seven) days. 20 mL 4  . furosemide (LASIX) 20 MG tablet Take 1 tablet (20 mg total) by mouth daily. 5 tablet 0   No facility-administered medications prior to visit.    Review of  Systems  Constitutional: Positive for malaise/fatigue. Negative for chills, diaphoresis, fever and weight loss.  HENT: Positive for congestion. Negative for sinus pain and sore throat.   Eyes: Negative for blurred vision.  Respiratory: Positive for cough, sputum production, shortness of breath and wheezing. Negative for hemoptysis.   Cardiovascular: Negative for chest pain, palpitations, orthopnea, claudication, leg swelling and PND.  Gastrointestinal: Negative for abdominal pain, diarrhea, heartburn, nausea and vomiting.  Genitourinary: Negative for dysuria and hematuria.  Musculoskeletal: Negative for joint pain and myalgias.  Neurological: Negative for dizziness, weakness and headaches.  Endo/Heme/Allergies: Does not bruise/bleed easily.  Psychiatric/Behavioral: Negative.    Objective:   Vitals:   03/22/20 1406  BP: 122/60  Pulse: 60  Temp: (!) 97.3 F (36.3 C)  TempSrc: Temporal  SpO2: 94%  Weight: 216 lb 6.4 oz (98.2 kg)  Height: 6' (1.829 m)     Physical Exam Constitutional:      General: He is not in acute distress.    Appearance: Normal appearance. He is not ill-appearing.  HENT:     Head: Normocephalic and atraumatic.     Nose: Rhinorrhea present.     Mouth/Throat:     Mouth: Mucous membranes are moist.     Pharynx: Oropharynx is clear.  Eyes:     General: No scleral icterus.    Conjunctiva/sclera: Conjunctivae normal.     Pupils: Pupils are equal, round, and reactive to light.  Cardiovascular:     Rate and Rhythm: Normal rate and regular rhythm.     Pulses: Normal pulses.     Heart sounds: Normal heart sounds. No murmur heard.   Pulmonary:     Effort: Pulmonary effort is normal.     Breath sounds: Decreased breath sounds present.  Abdominal:     General: Bowel sounds are normal.     Palpations: Abdomen is  soft.  Musculoskeletal:     Right lower leg: No edema.     Left lower leg: No edema.  Neurological:     General: No focal deficit present.      Mental Status: He is alert and oriented to person, place, and time. Mental status is at baseline.     Gait: Gait normal.  Psychiatric:        Mood and Affect: Mood normal.        Behavior: Behavior normal.        Thought Content: Thought content normal.        Judgment: Judgment normal.     CBC    Component Value Date/Time   WBC 7.2 12/22/2019 1155   RBC 5.58 12/22/2019 1155   HGB 16.3 12/22/2019 1155   HCT 50.8 (H) 12/22/2019 1155   PLT 315 12/22/2019 1155   MCV 91.0 12/22/2019 1155   MCH 29.2 12/22/2019 1155   MCHC 32.1 12/22/2019 1155   RDW 14.8 12/22/2019 1155   LYMPHSABS 2,390 12/22/2019 1155   MONOABS 0.9 04/01/2019 1056   EOSABS 94 12/22/2019 1155   BASOSABS 43 12/22/2019 1155     Chest imaging: Chest radiograph 06/09/2019 reviewed: signs of hyperinflation with flattened diaphragms and increased retrosternal airspace.   CTA Chest 10/22/2018 reviewed: lung parenchyma appears normal as well as the airways. The trachea has a slight saber sheath appearance.  PFT: FEV1/FVC Pre 67 FEV1/FVC Post 72 FEV1 Post 2.18L (68%) FVC Post 3.03L (68%) TLC 89% RV 124% DLCO 87%  Labs: Reviewed, as above  Echo: 10/25/2018 reviewed showing normal EF 55-60%. No diastolic dysfunction noted.      Assessment & Plan:   Chronic obstructive pulmonary disease, unspecified COPD type ([place]) - Plan: Flutter valve  Obstructive sleep apnea syndrome, severe - Plan: Ambulatory Referral for DME  Chronic rhinitis - Plan: Ambulatory referral to ENT  Need for immunization against influenza - Plan: Flu Vaccine QUAD High Dose(Fluad)  Discussion: Arul Farabee is a 79 year old male with coronary artery disease status post stent in 2020, sick sinus syndrome s/p pacemaker, hypertension, allergic rhinitis, GERD and obstructive sleep apnea not on CPAP who returns pulmonary clinic for shortness of breath and concern for chronic bronchitis.   He has mild obstructive lung disease with some concern for  air trapping based on recent pulmonary function tests. We will start him on Anoro Ellipta 1 inhalation daily and monitor for improvement of his shortness of breath and cough. We have provided him with a flutter valve for mucous clearance.   He continues to experience chronic rhinitis and post-nasal drip with cough. He did not improve with fluticasone and ipratropium nasal sprays. We will refer him to ENT for further evaluation and recommendations.   He has persistent severe obstructive sleep apnea based on recent home sleep study. We discussed the need for CPAP therapy and that he would like to try therapy with a nasal pillow mask as he had trouble with the full face mask in the past. He will be referred to our sleep medicine team.   Follow up in 3 months Freda Jackson, MD [facility] Office: [phone]    Current Outpatient Medications:  .  acyclovir (ZOVIRAX) 200 MG capsule, TAKE 1 CAPSULE BY MOUTH THREE TIMES A DAY, Disp: 270 capsule, Rfl: 0 .  anastrozole (ARIMIDEX) 1 MG tablet, TAKE 1 TABLET (1 MG TOTAL) BY MOUTH 2 (TWO) TIMES A WEEK., Disp: 24 tablet, Rfl: 1 .  ARMOUR THYROID  60 MG tablet, TAKE 2 TABLETS BY MOUTH EVERY DAY, Disp: 180 tablet, Rfl: 0 .  aspirin EC 81 MG tablet, Take 1 tablet (81 mg total) by mouth daily., Disp: 90 tablet, Rfl: 3 .  ezetimibe (ZETIA) 10 MG tablet, TAKE 1 TABLET BY MOUTH EVERY DAY, Disp: 90 tablet, Rfl: 1 .  fluticasone (FLONASE) 50 MCG/ACT nasal spray, SPRAY 2 SPRAYS INTO EACH NOSTRIL EVERY DAY, Disp: 48 mL, Rfl: 1 .  hydrALAZINE (APRESOLINE) 25 MG tablet, TAKE 1 TABLET BY MOUTH THREE TIMES A DAY, Disp: 270 tablet, Rfl: 1 .  ipratropium (ATROVENT) 0.03 % nasal spray, Place 2 sprays into both nostrils 3 (three) times daily as needed for rhinitis., Disp: 30 mL, Rfl: 12 .  liothyronine (CYTOMEL) 25 MCG tablet, Take 1 tablet (25 mcg total) by mouth every other day., Disp: 45 tablet, Rfl: 0 .  LIVALO 4 MG TABS, TAKE 1 TABLET BY MOUTH  EVERY DAY, Disp: 90 tablet, Rfl: 1 .  montelukast (SINGULAIR) 10 MG tablet, TAKE 1 TABLET BY MOUTH EVERYDAY AT BEDTIME, Disp: 90 tablet, Rfl: 1 .  Nandrolone Decanoate 200 MG/ML OIL, Inject 100 mg as directed once a week., Disp: 10 mL, Rfl: 1 .  Potassium Gluconate 2.5 MEQ TABS, Take by mouth., Disp: , Rfl:  .  Somatropin (OMNITROPE) 5 MG/1.5ML SOCT, Inject 0.15 mLs into the skin daily. Add 1.14 mL of sterile diluent to each vial of omnitrope. Inject Monday thru Friday qhs., Disp: 10 mL, Rfl: 1 .  SYRINGE-NEEDLE, DISP, 3 ML 23G X 1" 3 ML MISC, Use to inject testosterone as prescribed., Disp: 50 each, Rfl: 0 .  tamsulosin (FLOMAX) 0.4 MG CAPS capsule, TAKE 1 CAPSULE BY MOUTH EVERY DAY IN THE MORNING, Disp: 90 capsule, Rfl: 1 .  testosterone cypionate (DEPOTESTOSTERONE CYPIONATE) 200 MG/ML injection, Inject 1.25 mls (250 mg total) into the muscle every 7 (seven) days., Disp: 20 mL, Rfl: 4 .  umeclidinium-vilanterol (ANORO ELLIPTA) 62.5-25 MCG/INH AEPB, Inhale 1 puff into the lungs daily., Disp: 60 each, Rfl: 11

## 2020-04-01 DIAGNOSIS — G4733 Obstructive sleep apnea (adult) (pediatric): Secondary | ICD-10-CM | POA: Diagnosis not present

## 2020-04-01 DIAGNOSIS — I2510 Atherosclerotic heart disease of native coronary artery without angina pectoris: Secondary | ICD-10-CM | POA: Diagnosis not present

## 2020-04-01 DIAGNOSIS — H3341 Traction detachment of retina, right eye: Secondary | ICD-10-CM | POA: Diagnosis not present

## 2020-04-01 DIAGNOSIS — H3521 Other non-diabetic proliferative retinopathy, right eye: Secondary | ICD-10-CM | POA: Diagnosis not present

## 2020-04-01 DIAGNOSIS — J449 Chronic obstructive pulmonary disease, unspecified: Secondary | ICD-10-CM | POA: Diagnosis not present

## 2020-04-01 DIAGNOSIS — H3321 Serous retinal detachment, right eye: Secondary | ICD-10-CM | POA: Diagnosis not present

## 2020-04-01 DIAGNOSIS — E079 Disorder of thyroid, unspecified: Secondary | ICD-10-CM | POA: Diagnosis not present

## 2020-04-02 DIAGNOSIS — Z888 Allergy status to other drugs, medicaments and biological substances status: Secondary | ICD-10-CM | POA: Diagnosis not present

## 2020-04-02 DIAGNOSIS — Z961 Presence of intraocular lens: Secondary | ICD-10-CM | POA: Diagnosis not present

## 2020-04-02 DIAGNOSIS — H3321 Serous retinal detachment, right eye: Secondary | ICD-10-CM | POA: Diagnosis not present

## 2020-04-02 DIAGNOSIS — H35371 Puckering of macula, right eye: Secondary | ICD-10-CM | POA: Diagnosis not present

## 2020-04-02 DIAGNOSIS — Z881 Allergy status to other antibiotic agents status: Secondary | ICD-10-CM | POA: Diagnosis not present

## 2020-04-02 DIAGNOSIS — Z882 Allergy status to sulfonamides status: Secondary | ICD-10-CM | POA: Diagnosis not present

## 2020-04-02 DIAGNOSIS — H3521 Other non-diabetic proliferative retinopathy, right eye: Secondary | ICD-10-CM | POA: Diagnosis not present

## 2020-04-02 DIAGNOSIS — Z48810 Encounter for surgical aftercare following surgery on the sense organs: Secondary | ICD-10-CM | POA: Diagnosis not present

## 2020-04-02 DIAGNOSIS — Z885 Allergy status to narcotic agent status: Secondary | ICD-10-CM | POA: Diagnosis not present

## 2020-04-09 DIAGNOSIS — H3521 Other non-diabetic proliferative retinopathy, right eye: Secondary | ICD-10-CM | POA: Diagnosis not present

## 2020-04-09 DIAGNOSIS — Z961 Presence of intraocular lens: Secondary | ICD-10-CM | POA: Diagnosis not present

## 2020-04-09 DIAGNOSIS — H35371 Puckering of macula, right eye: Secondary | ICD-10-CM | POA: Diagnosis not present

## 2020-04-09 DIAGNOSIS — H3321 Serous retinal detachment, right eye: Secondary | ICD-10-CM | POA: Diagnosis not present

## 2020-04-15 DIAGNOSIS — C44319 Basal cell carcinoma of skin of other parts of face: Secondary | ICD-10-CM | POA: Diagnosis not present

## 2020-04-26 ENCOUNTER — Other Ambulatory Visit (INDEPENDENT_AMBULATORY_CARE_PROVIDER_SITE_OTHER): Payer: Self-pay

## 2020-04-26 ENCOUNTER — Other Ambulatory Visit: Payer: Self-pay

## 2020-04-26 ENCOUNTER — Ambulatory Visit (INDEPENDENT_AMBULATORY_CARE_PROVIDER_SITE_OTHER): Payer: Medicare Other | Admitting: Otolaryngology

## 2020-04-26 ENCOUNTER — Encounter (INDEPENDENT_AMBULATORY_CARE_PROVIDER_SITE_OTHER): Payer: Self-pay | Admitting: Otolaryngology

## 2020-04-26 VITALS — Temp 97.7°F

## 2020-04-26 DIAGNOSIS — R490 Dysphonia: Secondary | ICD-10-CM

## 2020-04-26 DIAGNOSIS — K219 Gastro-esophageal reflux disease without esophagitis: Secondary | ICD-10-CM | POA: Diagnosis not present

## 2020-04-26 DIAGNOSIS — J310 Chronic rhinitis: Secondary | ICD-10-CM

## 2020-04-26 DIAGNOSIS — I2510 Atherosclerotic heart disease of native coronary artery without angina pectoris: Secondary | ICD-10-CM | POA: Diagnosis not present

## 2020-04-26 DIAGNOSIS — J029 Acute pharyngitis, unspecified: Secondary | ICD-10-CM | POA: Diagnosis not present

## 2020-04-26 MED ORDER — AMOXICILLIN 875 MG PO TABS
875.0000 mg | ORAL_TABLET | Freq: Two times a day (BID) | ORAL | 0 refills | Status: AC
Start: 2020-04-26 — End: 2020-05-06

## 2020-04-26 NOTE — Progress Notes (Signed)
HPI: [name] Glass is a 79 y.o. male who presents for evaluation of chronic nasal congestion and postnasal drainage.  He complains of chronically having to clear his throat with a lot of postnasal drainage.  He has underlying pulmonary problems and frequently coughs up thick mucus.  He has been using Flonase regularly in the mornings. He also has history of GE reflux disease and takes Prilosec twice daily. Over the past week and a half he has been having some discomfort in the right side of his throat and always has persistent hoarseness.  Although he describes some difficulty swallowing because of soreness on the right side he has had no weight loss. He also states that he is unable to "pop" his right ear although he is not having any hearing problems. Of note he had a CT scan of his neck performed in February 2019 and on review of this he had clear paranasal sinuses. He has had previous nasal surgery with Dr. Erik Obey where he had septoplasty, turbinate reductions and UPPP.[name] He presents today with his wife.  Past Medical History:  Diagnosis Date  . Arthritis   . Basal cell carcinoma 02/17/2020   infl & ulcerated-Left forehead (MOHS)  . Coronary artery disease   . GERD (gastroesophageal reflux disease)   . H/O bronchitis   . H/O hiatal hernia   . History of kidney stones last in 1976  . Hyperlipidemia    statin intolerant, under control  . Hypertension    under control  . Hypertensive retinopathy    OU  . Hypothyroidism   . Moderate persistent asthma without complication 07/12/3783  . Retinal detachment    OD  . 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   left helix tx cx3 40fu  . Sleep apnea    hx of, surgery to reverse   Past Surgical History:  Procedure Laterality Date  . CARDIAC CATHETERIZATION Right 2008   5 heart stents  . CATARACT EXTRACTION Bilateral 6 years ago  . CYSTOSCOPY N/A 08/06/2012   Procedure: CYSTOSCOPY;  Surgeon: [name], MD;  Location: [facility];   Service: Urology;  Laterality: N/A;  . ESOPHAGOGASTRIC FUNDOPLICATION  8850   spleen removed, 5 pints of blood given  . EYE SURGERY    . HERNIA REPAIR  1985   x4  . INGUINAL HERNIA REPAIR  1992  . PACEMAKER IMPLANT N/A 10/30/2018   Procedure: PACEMAKER IMPLANT;  Surgeon: Constance Haw, MD;  Location: [facility];  Service: Cardiovascular;  Laterality: N/A;  . PACEMAKER INSERTION    . PPM GENERATOR REMOVAL N/A 10/25/2018   Procedure: PPM GENERATOR REMOVAL;  Surgeon: Constance Haw, MD;  Location: [facility];  Service: Cardiovascular;  Laterality: N/A;  . PROSTATE ABLATION  2013   "laser ablation"  . RETINAL DETACHMENT SURGERY Right 01/24/2019   Pneumatic cryopexy - Dr. Bernarda Caffey  . TEE WITHOUT CARDIOVERSION N/A 10/25/2018   Procedure: TRANSESOPHAGEAL ECHOCARDIOGRAM (TEE);  Surgeon: Acie Fredrickson Wonda Cheng, MD;  Location: [facility];  Service: Cardiovascular;  Laterality: N/A;  . TEMPORARY PACEMAKER N/A 10/25/2018   Procedure: TEMPORARY PACEMAKER;  Surgeon: Constance Haw, MD;  Location: [facility];  Service: Cardiovascular;  Laterality: N/A;  . TONSILLECTOMY  as child  . TOTAL KNEE ARTHROPLASTY Right 08/03/2014   Procedure: TOTAL RIGHT KNEE ARTHROPLASTY;  Surgeon: [name], MD;  Location: [facility];  Service: Orthopedics;  Laterality: Right;  . TRANSURETHRAL RESECTION OF PROSTATE N/A 08/06/2012  Procedure: FULGERATION OF PROSTATE WITH GYRUS ;  Surgeon: [name], MD;  Location: [facility];  Service: Urology;  Laterality: N/A;  . UMBILICAL HERNIA REPAIR  1990  . UVULECTOMY  2005   for sleep apnea  . UVULOPALATOPHARYNGOPLASTY  6 years ago   Social History   Socioeconomic History  . Marital status: Divorced    Spouse name: Not on file  . Number of children: 0  . Years of education: Not on file  . Highest education level: Not on file  Occupational History  . Occupation: Retired   Tobacco Use  . Smoking status: Never Smoker  . Smokeless  tobacco: Never Used  Vaping Use  . Vaping Use: Never used  Substance and Sexual Activity  . Alcohol use: Yes    Alcohol/week: 4.0 standard drinks    Types: 4 Standard drinks or equivalent per week    Comment: 3 drinks at night   . Drug use: No  . Sexual activity: Not on file  Other Topics Concern  . Not on file  Social History Narrative  . Not on file   Social Determinants of Health   Financial Resource Strain: Low Risk   . Difficulty of Paying Living Expenses: Not hard at all  Food Insecurity: No Food Insecurity  . Worried About Charity fundraiser in the Last Year: Never true  . Ran Out of Food in the Last Year: Never true  Transportation Needs: No Transportation Needs  . Lack of Transportation (Medical): No  . Lack of Transportation (Non-Medical): No  Physical Activity: Sufficiently Active  . Days of Exercise per Week: 7 days  . Minutes of Exercise per Session: 30 min  Stress: No Stress Concern Present  . Feeling of Stress : Not at all  Social Connections: Socially Integrated  . Frequency of Communication with Friends and Family: More than three times a week  . Frequency of Social Gatherings with Friends and Family: More than three times a week  . Attends Religious Services: More than 4 times per year  . Active Member of Clubs or Organizations: Yes  . Attends Archivist Meetings: More than 4 times per year  . Marital Status: Married   Family History  Problem Relation Age of Onset  . Heart attack Father 39       lived to 47  . Alzheimer's disease Mother   . Stroke Mother   . Breast cancer Mother   . Alzheimer's disease Maternal Grandmother   . Stroke Maternal Grandmother   . Lung cancer Maternal Grandmother   . Lung cancer Maternal Grandfather   . Cancer Paternal Grandmother        type unknown  . Diabetes Paternal Grandfather   . Other Paternal Grandfather        Growth in the back of the head   Allergies  Allergen Reactions  . Morphine And  Related     Had "crazy dreams" felt "crazy" per pt.  . Bactrim [Sulfamethoxazole-Trimethoprim]   . Statins    Prior to Admission medications   Medication Sig Start Date End Date Taking? Authorizing Provider  acyclovir (ZOVIRAX) 200 MG capsule TAKE 1 CAPSULE BY MOUTH THREE TIMES A DAY 01/27/20  Yes [name], MD  anastrozole (ARIMIDEX) 1 MG tablet TAKE 1 TABLET (1 MG TOTAL) BY MOUTH 2 (TWO) TIMES A WEEK. 11/03/19  Yes [name], MD  ARMOUR THYROID 60 MG tablet TAKE 2 TABLETS BY MOUTH EVERY DAY 03/14/20  Yes  [name], MD  aspirin EC 81 MG tablet Take 1 tablet (81 mg total) by mouth daily. 07/30/17  Yes [name], MD  ezetimibe (ZETIA) 10 MG tablet TAKE 1 TABLET BY MOUTH EVERY DAY 10/20/19  Yes [name], MD  fluticasone [facility]) 50 MCG/ACT nasal spray SPRAY 2 SPRAYS INTO EACH NOSTRIL EVERY DAY 07/23/19  Yes [name], MD  hydrALAZINE (APRESOLINE) 25 MG tablet TAKE 1 TABLET BY MOUTH THREE TIMES A DAY 12/24/19  Yes [name], MD  ipratropium (ATROVENT) 0.03 % nasal spray Place 2 sprays into both nostrils 3 (three) times daily as needed for rhinitis. 01/20/20  Yes Freddi Starr, MD  liothyronine (CYTOMEL) 25 MCG tablet Take 1 tablet (25 mcg total) by mouth every other day. 12/23/19  Yes [name], MD  LIVALO 4 MG TABS TAKE 1 TABLET BY MOUTH EVERY DAY 01/19/20  Yes [name], MD  montelukast (SINGULAIR) 10 MG tablet TAKE 1 TABLET BY MOUTH EVERYDAY AT BEDTIME 11/22/19  Yes [name], MD  Nandrolone Decanoate 200 MG/ML OIL Inject 100 mg as directed once a week. 01/28/20  Yes [name], MD  Potassium Gluconate 2.5 MEQ TABS Take by mouth.   Yes [provider]  Somatropin (OMNITROPE) 5 MG/1.5ML SOCT Inject 0.15 mLs into the skin daily. Add 1.14 mL of sterile diluent to each vial of omnitrope. Inject Monday thru Friday qhs. 09/30/19  Yes [name], MD  SYRINGE-NEEDLE, DISP, 3 ML 23G X 1" 3 ML MISC Use to inject testosterone as  prescribed. 08/06/19  Yes [name], MD  tamsulosin (FLOMAX) 0.4 MG CAPS capsule TAKE 1 CAPSULE BY MOUTH EVERY DAY IN THE MORNING 11/06/19  Yes [name], MD  testosterone cypionate (DEPOTESTOSTERONE CYPIONATE) 200 MG/ML injection Inject 1.25 mls (250 mg total) into the muscle every 7 (seven) days. 03/02/20  Yes [name], MD  umeclidinium-vilanterol (ANORO ELLIPTA) 62.5-25 MCG/INH AEPB Inhale 1 puff into the lungs daily. 03/22/20  Yes Freddi Starr, MD  amoxicillin (AMOXIL) 875 MG tablet Take 1 tablet (875 mg total) by mouth 2 (two) times daily for 10 days. 04/26/20 05/06/20  Rozetta Nunnery, MD  anastrozole (ARIMIDEX) 1 MG tablet Take 1 tablet (1 mg total) by mouth 2 (two) times a week. 05/22/19   [name], MD  clopidogrel (PLAVIX) 75 MG tablet TAKE 1 TABLET BY MOUTH EVERY DAY 04/22/19   [name], MD  ezetimibe (ZETIA) 10 MG tablet Take 1 tablet (10 mg total) by mouth daily. 11/21/18   [name], MD  fluticasone (FLONASE) 50 MCG/ACT nasal spray Place 2 sprays into both nostrils daily. 01/15/19   [name], MD  hydrALAZINE (APRESOLINE) 25 MG tablet TAKE 1 TABLET BY MOUTH 3 TIMES A DAY 01/02/19   [name], MD  liothyronine (CYTOMEL) 25 MCG tablet TAKE 1 TABLET BY MOUTH EVERY DAY IN THE MORNING 05/20/19   [name], MD  montelukast (SINGULAIR) 10 MG tablet Take 1 tablet (10 mg total) by mouth at bedtime. 06/09/19   [name], MD  Pitavastatin Calcium (LIVALO) 4 MG TABS Take 1 tablet (4 mg total) by mouth daily. 01/08/19   [name], MD  tamsulosin (FLOMAX) 0.4 MG CAPS capsule TAKE 1 CAPSULE BY MOUTH EVERY DAY IN THE MORNING 05/20/19   [name], MD     Positive ROS: Otherwise negative  All other systems have been reviewed and  were otherwise negative with the exception of those mentioned in the HPI and as above.  Physical Exam: Constitutional: Alert, well-appearing, no acute distress Ears: External ears without lesions or  tenderness. Ear canals are clear bilaterally with intact, clear TMs.  Nasal: External nose without lesions. Septum septum is relatively midline with clear nasal passages bilaterally.  Both middle meatus regions are clear with no signs of infection.  Only clear mucus discharge within the nasal cavity..  On nasal endoscopy the nasopharynx was clear and eustachian tube regions were unobstructed. Oral: Lips and gums without lesions. Tongue and palate mucosa without lesions. Posterior oropharynx clear.  Patient is status post UPPP. Fiberoptic laryngoscopy was performed through both sides.  The nasopharynx was clear.  The base of tongue vallecula and epiglottis were normal.  He had mild supraglottic edema of the arytenoid mucosal.  Has slight erythema of the mucosa on the right side but no obvious mucosal lesions noted.  Vocal cords were clear bilaterally with normal vocal mobility.  Findings are consistent with possible reflux symptoms.  No evidence of neoplasm. Neck: No palpable adenopathy or masses.  No palpable masses in the neck. Respiratory: Breathing comfortably  Skin: No facial/neck lesions or rash noted.  Laryngoscopy  Date/Time: 04/26/2020 4:11 PM Performed by: Rozetta Nunnery, MD Authorized by: Rozetta Nunnery, MD   Consent:    Consent obtained:  Verbal   Consent given by:  Patient Procedure details:    Indications: hoarseness, dysphagia, or aspiration and sino-nasal symptoms     Medication:  Afrin   Instrument: flexible fiberoptic laryngoscope     Scope location: bilateral nare   Sinus:    Right middle meatus: normal     Left middle meatus: normal     Right nasopharynx: normal     Left nasopharynx: normal   Mouth:    Vallecula: normal     Base of tongue: normal     Epiglottis: normal   Throat:    Right hypopharynx: normal     inflammation     Pyriform sinus: normal     inflammation     True vocal cords: normal   Comments:     On fiberoptic laryngoscopy the  nasopharynx was clear.  The base of tongue vallecula epiglottis were clear.  He has slight erythema of the mucous membranes of the right hypopharynx and piriform sinus but no obvious mucosal lesions noted.  Also had moderate mucosal arytenoid edema.  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 Recent history of sore throat on the right side. Intermittent hoarseness.  Plan: Recommended regular use of Flonase 2 sprays each nostril at night instead of in the morning. Reviewed with him concerning frequent use of saline nasal irrigation such as a Nettie pot or similar as this will help with the excessive mucus production and postnasal drainage. Also prescribed amoxicillin 875 mg twice daily for 10 days although the sore throat and inflammation of the mucous membranes in the piriform region on the right side is probably more related to reflux disease versus actual infection. He will notify us if the sore throat worsens or does not improve.  Radene Journey, MD

## 2020-04-26 NOTE — Progress Notes (Signed)
Amoxicillian

## 2020-04-28 ENCOUNTER — Ambulatory Visit (INDEPENDENT_AMBULATORY_CARE_PROVIDER_SITE_OTHER): Payer: Medicare Other

## 2020-04-28 DIAGNOSIS — I495 Sick sinus syndrome: Secondary | ICD-10-CM

## 2020-04-28 LAB — CUP PACEART REMOTE DEVICE CHECK
Battery Remaining Longevity: 158 mo
Battery Voltage: 3.05 V
Brady Statistic AP VP Percent: 0.15 %
Brady Statistic AP VS Percent: 29.44 %
Brady Statistic AS VP Percent: 0.15 %
Brady Statistic AS VS Percent: 70.26 %
Brady Statistic RA Percent Paced: 29.63 %
Brady Statistic RV Percent Paced: 0.3 %
Date Time Interrogation Session: 20211124005124
Implantable Lead Implant Date: 20200527
Implantable Lead Implant Date: 20200527
Implantable Lead Location: 753859
Implantable Lead Location: 753860
Implantable Lead Model: 5076
Implantable Lead Model: 5076
Implantable Pulse Generator Implant Date: 20200527
Lead Channel Impedance Value: 361 Ohm
Lead Channel Impedance Value: 399 Ohm
Lead Channel Impedance Value: 437 Ohm
Lead Channel Impedance Value: 627 Ohm
Lead Channel Pacing Threshold Amplitude: 0.375 V
Lead Channel Pacing Threshold Amplitude: 0.625 V
Lead Channel Pacing Threshold Pulse Width: 0.4 ms
Lead Channel Pacing Threshold Pulse Width: 0.4 ms
Lead Channel Sensing Intrinsic Amplitude: 5.375 mV
Lead Channel Sensing Intrinsic Amplitude: 5.375 mV
Lead Channel Sensing Intrinsic Amplitude: 9.375 mV
Lead Channel Sensing Intrinsic Amplitude: 9.375 mV
Lead Channel Setting Pacing Amplitude: 1.5 V
Lead Channel Setting Pacing Amplitude: 2.5 V
Lead Channel Setting Pacing Pulse Width: 0.4 ms
Lead Channel Setting Sensing Sensitivity: 2 mV

## 2020-04-30 ENCOUNTER — Other Ambulatory Visit: Payer: Self-pay | Admitting: Internal Medicine

## 2020-04-30 DIAGNOSIS — E291 Testicular hypofunction: Secondary | ICD-10-CM

## 2020-05-06 NOTE — Progress Notes (Signed)
Remote pacemaker transmission.   

## 2020-05-07 ENCOUNTER — Telehealth: Payer: Self-pay | Admitting: Internal Medicine

## 2020-05-07 DIAGNOSIS — H35371 Puckering of macula, right eye: Secondary | ICD-10-CM | POA: Diagnosis not present

## 2020-05-07 DIAGNOSIS — H3321 Serous retinal detachment, right eye: Secondary | ICD-10-CM | POA: Diagnosis not present

## 2020-05-07 DIAGNOSIS — H3521 Other non-diabetic proliferative retinopathy, right eye: Secondary | ICD-10-CM | POA: Diagnosis not present

## 2020-05-07 DIAGNOSIS — Z48810 Encounter for surgical aftercare following surgery on the sense organs: Secondary | ICD-10-CM | POA: Diagnosis not present

## 2020-05-07 DIAGNOSIS — Z961 Presence of intraocular lens: Secondary | ICD-10-CM | POA: Diagnosis not present

## 2020-05-07 NOTE — Telephone Encounter (Signed)
Patient called and said that he has been to the ENT and they said there was nothing wrong with his right ear but it is still bothering him. Also he was wondering if he should put peroxide or alcohol in his ear as well. He can be reached at [phone]

## 2020-05-07 NOTE — Telephone Encounter (Signed)
Pt has been informed, expressed understanding and will d/c putting those in his ear.

## 2020-05-07 NOTE — Telephone Encounter (Signed)
Please advise 

## 2020-05-10 ENCOUNTER — Other Ambulatory Visit: Payer: Self-pay | Admitting: Internal Medicine

## 2020-05-11 ENCOUNTER — Other Ambulatory Visit: Payer: Self-pay

## 2020-05-11 ENCOUNTER — Ambulatory Visit (INDEPENDENT_AMBULATORY_CARE_PROVIDER_SITE_OTHER): Payer: Medicare Other | Admitting: Internal Medicine

## 2020-05-11 ENCOUNTER — Encounter: Payer: Self-pay | Admitting: Internal Medicine

## 2020-05-11 ENCOUNTER — Telehealth: Payer: Self-pay | Admitting: Internal Medicine

## 2020-05-11 VITALS — BP 148/82 | HR 70 | Temp 97.9°F | Resp 16 | Ht 72.0 in | Wt 216.0 lb

## 2020-05-11 DIAGNOSIS — H6981 Other specified disorders of Eustachian tube, right ear: Secondary | ICD-10-CM | POA: Diagnosis not present

## 2020-05-11 DIAGNOSIS — I2510 Atherosclerotic heart disease of native coronary artery without angina pectoris: Secondary | ICD-10-CM

## 2020-05-11 DIAGNOSIS — H6504 Acute serous otitis media, recurrent, right ear: Secondary | ICD-10-CM | POA: Diagnosis not present

## 2020-05-11 DIAGNOSIS — H6991 Unspecified Eustachian tube disorder, right ear: Secondary | ICD-10-CM

## 2020-05-11 MED ORDER — METHYLPREDNISOLONE ACETATE 40 MG/ML IJ SUSP: 40.0000 mg | Freq: Once | INTRAMUSCULAR | Status: DC

## 2020-05-11 MED ORDER — METHYLPREDNISOLONE ACETATE 80 MG/ML IJ SUSP: 80.0000 mg | Freq: Once | INTRAMUSCULAR | Status: DC

## 2020-05-11 MED ORDER — METHYLPREDNISOLONE ACETATE 80 MG/ML IJ SUSP
120.0000 mg | Freq: Once | INTRAMUSCULAR | Status: AC
  Administered 2020-05-11: 120 mg via INTRAMUSCULAR

## 2020-05-11 MED ORDER — AMOXICILLIN-POT CLAVULANATE 875-125 MG PO TABS: 1.0000 | ORAL_TABLET | Freq: Two times a day (BID) | ORAL | 0 refills | Status: AC

## 2020-05-11 MED ORDER — LEVOCETIRIZINE DIHYDROCHLORIDE 5 MG PO TABS: 5.0000 mg | ORAL_TABLET | Freq: Every evening | ORAL | 1 refills | Status: DC

## 2020-05-11 NOTE — Patient Instructions (Signed)

## 2020-05-11 NOTE — Telephone Encounter (Signed)
Patient called and said that he still is stopped up and is not feeling any better. He said that he saw the ENT twice and was not satisfied. He was wondering if he could have an in office visit.

## 2020-05-11 NOTE — Progress Notes (Signed)
Subjective:  Patient ID: [name] Glass, male    DOB: 1941/01/24  Age: 79 y.o. MRN: [id number]  CC: Ear Fullness  This visit occurred during the SARS-CoV-2 public health emergency.  Safety protocols were in place, including screening questions prior to the visit, additional usage of staff PPE, and extensive cleaning of exam room while observing appropriate contact time as indicated for disinfecting solutions.    HPI [name] Glass presents for the complaint of 3 to 4-week history of right ear pain with muffled sensation and decreased level of hearing.  He recently saw an ENT doctor and was prescribed amoxicillin which has not helped.  He continues to complain of chronic cough productive of yellow phlegm.  He denies fevers, chills, headaches, dizziness, lightheadedness, or vertigo.  Outpatient Medications Prior to Visit  Medication Sig Dispense Refill  . acyclovir (ZOVIRAX) 200 MG capsule TAKE 1 CAPSULE BY MOUTH THREE TIMES A DAY 270 capsule 0  . anastrozole (ARIMIDEX) 1 MG tablet TAKE 1 TABLET (1 MG TOTAL) BY MOUTH 2 (TWO) TIMES A WEEK. 24 tablet 1  . ARMOUR THYROID 60 MG tablet TAKE 2 TABLETS BY MOUTH EVERY DAY 180 tablet 0  . aspirin EC 81 MG tablet Take 1 tablet (81 mg total) by mouth daily. 90 tablet 3  . ezetimibe (ZETIA) 10 MG tablet TAKE 1 TABLET BY MOUTH EVERY DAY 90 tablet 1  . fluticasone (FLONASE) 50 MCG/ACT nasal spray SPRAY 2 SPRAYS INTO EACH NOSTRIL EVERY DAY 48 mL 1  . hydrALAZINE (APRESOLINE) 25 MG tablet TAKE 1 TABLET BY MOUTH THREE TIMES A DAY 270 tablet 1  . ipratropium (ATROVENT) 0.03 % nasal spray Place 2 sprays into both nostrils 3 (three) times daily as needed for rhinitis. 30 mL 12  . liothyronine (CYTOMEL) 25 MCG tablet Take 1 tablet (25 mcg total) by mouth every other day. 45 tablet 0  . LIVALO 4 MG TABS TAKE 1 TABLET BY MOUTH EVERY DAY 90 tablet 1  . montelukast (SINGULAIR) 10 MG tablet TAKE 1 TABLET BY MOUTH EVERYDAY AT BEDTIME 90 tablet 1  . Nandrolone Decanoate  200 MG/ML OIL Inject 100 mg as directed once a week. 10 mL 1  . Potassium Gluconate 2.5 MEQ TABS Take by mouth.    . Somatropin (OMNITROPE) 5 MG/1.5ML SOCT Inject 0.15 mLs into the skin daily. Add 1.14 mL of sterile diluent to each vial of omnitrope. Inject Monday thru Friday qhs. 10 mL 1  . SYRINGE-NEEDLE, DISP, 3 ML 23G X 1" 3 ML MISC Use to inject testosterone as prescribed. 50 each 0  . tamsulosin (FLOMAX) 0.4 MG CAPS capsule TAKE 1 CAPSULE BY MOUTH EVERY DAY IN THE MORNING 90 capsule 1  . testosterone cypionate (DEPOTESTOSTERONE CYPIONATE) 200 MG/ML injection Inject 1.25 mls (250 mg total) into the muscle every 7 (seven) days. 20 mL 4  . umeclidinium-vilanterol (ANORO ELLIPTA) 62.5-25 MCG/INH AEPB Inhale 1 puff into the lungs daily. 60 each 11   No facility-administered medications prior to visit.    ROS Review of Systems  Constitutional: Negative for chills, diaphoresis, fatigue and fever.  HENT: Positive for ear pain and hearing loss. Negative for sinus pressure, sore throat and voice change.   Respiratory: Positive for cough. Negative for chest tightness, shortness of breath and wheezing.   Cardiovascular: Negative for chest pain, palpitations and leg swelling.  Gastrointestinal: Negative for abdominal pain, blood in stool, constipation, diarrhea, nausea and vomiting.  Genitourinary: Negative.  Negative for difficulty urinating.  Musculoskeletal: Negative.  Negative for arthralgias and myalgias.  Skin: Negative.  Negative for color change and pallor.  Neurological: Negative.  Negative for dizziness, weakness, light-headedness and headaches.  Hematological: Negative for adenopathy. Does not bruise/bleed easily.  Psychiatric/Behavioral: Negative.     Objective:  BP (!) 148/82   Pulse 70   Temp 97.9 F (36.6 C) (Oral)   Resp 16   Ht 6' (1.829 m)   Wt 216 lb (98 kg)   SpO2 96%   BMI 29.29 kg/m   BP Readings from Last 3 Encounters:  05/11/20 (!) 148/82  03/22/20 122/60   03/12/20 (!) 144/72    Wt Readings from Last 3 Encounters:  05/11/20 216 lb (98 kg)  03/22/20 216 lb 6.4 oz (98.2 kg)  03/12/20 216 lb (98 kg)    Physical Exam Vitals reviewed.  Constitutional:      Appearance: Normal appearance.  HENT:     Right Ear: Ear canal and external ear normal. Decreased hearing noted. A middle ear effusion is present. There is no impacted cerumen. No mastoid tenderness. Tympanic membrane is bulging.     Left Ear: Hearing, tympanic membrane, ear canal and external ear normal.     Mouth/Throat:     Mouth: Mucous membranes are moist.  Eyes:     General: No scleral icterus.    Conjunctiva/sclera: Conjunctivae normal.  Cardiovascular:     Rate and Rhythm: Normal rate and regular rhythm.     Heart sounds: Murmur heard.   Systolic murmur is present with a grade of 3/6.  No diastolic murmur is present.   Pulmonary:     Effort: Pulmonary effort is normal.     Breath sounds: No wheezing, rhonchi or rales.  Abdominal:     General: Abdomen is flat.     Palpations: There is no mass.     Tenderness: There is no abdominal tenderness. There is no guarding.  Musculoskeletal:        General: Normal range of motion.     Cervical back: Neck supple.     Right lower leg: No edema.     Left lower leg: No edema.  Lymphadenopathy:     Cervical: No cervical adenopathy.  Skin:    General: Skin is warm and dry.  Neurological:     General: No focal deficit present.     Mental Status: He is alert.  Psychiatric:        Mood and Affect: Mood normal.        Behavior: Behavior normal.     Lab Results  Component Value Date   WBC 7.2 12/22/2019   HGB 16.3 12/22/2019   HCT 50.8 (H) 12/22/2019   PLT 315 12/22/2019   GLUCOSE 89 12/22/2019   CHOL 154 12/22/2019   TRIG 182 (H) 12/22/2019   HDL 46 12/22/2019   LDLCALC 80 12/22/2019   ALT 21 12/22/2019   AST 21 12/22/2019   NA 142 12/22/2019   K 4.5 12/22/2019   CL 107 12/22/2019   CREATININE 1.11 12/22/2019    BUN 18 12/22/2019   CO2 29 12/22/2019   TSH 0.07 (L) 12/22/2019   PSA 4.4 (H) 12/22/2019   INR 0.96 07/28/2014   HGBA1C 6.1 07/21/2017    US BREAST LTD UNI LEFT INC AXILLA  Result Date: 01/19/2020 CLINICAL DATA:  79 year old male presenting with a new palpable area of concern in the left breast. EXAM: DIGITAL DIAGNOSTIC BILATERAL MAMMOGRAM WITH CAD AND TOMO ULTRASOUND BILATERAL BREAST COMPARISON:  None. ACR Breast Density  Category b: There are scattered areas of fibroglandular density. FINDINGS: Mammogram: Right breast: Spot compression tomosynthesis views were performed in addition to standard views for an asymmetry in the outer right breast. On the additional views the focal asymmetry is less conspicuous and most likely represents fibroglandular tissue. Left breast: A skin BB marks the palpable site of concern in the upper outer left breast anterior depth. Spot compression tomosynthesis views were performed in addition to standard views. There is an asymmetry at the palpable site of concern which is less conspicuous on the spot imaging. Mammographic images were processed with CAD. On physical exam of the left breast at the palpable site of concern I feel a small area of tissue without a fixed discrete mass. Similarly on physical exam of the right breast there is no fixed discrete mass. Ultrasound: Targeted ultrasound is performed in the right breast at 9 o'clock 2 cm from the nipple demonstrating a small island of tissue measuring 0.7 x 0.6 x 0.8 cm which likely corresponds to the asymmetry seen mammographically. There is no internal blood flow. Targeted ultrasound the of the right breast at 10 o'clock 5 cm from the nipple demonstrates a small benign intramammary lymph node with cortical thickness of 0.1 cm. Targeted ultrasound of the right axilla demonstrates normal-appearing lymph nodes. Targeted ultrasound is performed in the left breast at 1 o'clock 2 cm from nipple at the palpable area of concern  demonstrating normal fibroglandular tissue without a suspicious cystic or solid mass. IMPRESSION: 1. Right breast focal asymmetry without a sonographic correlate is probably benign. 2. At the palpable site of concern there is a focal asymmetry which is probably benign and most likely represents normal fibroglandular tissue. RECOMMENDATION: Diagnostic bilateral mammogram and possible ultrasound in 6 months. The patient was counseled to continue physical examination and to return sooner should the lump in his left breast enlarge. I have discussed the findings and recommendations with the patient. If applicable, a reminder letter will be sent to the patient regarding the next appointment. BI-RADS CATEGORY  3: Probably benign. Electronically Signed   By: Audie Pinto M.D.   On: 01/19/2020 15:39   US BREAST LTD UNI RIGHT INC AXILLA  Result Date: 01/19/2020 CLINICAL DATA:  79 year old male presenting with a new palpable area of concern in the left breast. EXAM: DIGITAL DIAGNOSTIC BILATERAL MAMMOGRAM WITH CAD AND TOMO ULTRASOUND BILATERAL BREAST COMPARISON:  None. ACR Breast Density Category b: There are scattered areas of fibroglandular density. FINDINGS: Mammogram: Right breast: Spot compression tomosynthesis views were performed in addition to standard views for an asymmetry in the outer right breast. On the additional views the focal asymmetry is less conspicuous and most likely represents fibroglandular tissue. Left breast: A skin BB marks the palpable site of concern in the upper outer left breast anterior depth. Spot compression tomosynthesis views were performed in addition to standard views. There is an asymmetry at the palpable site of concern which is less conspicuous on the spot imaging. Mammographic images were processed with CAD. On physical exam of the left breast at the palpable site of concern I feel a small area of tissue without a fixed discrete mass. Similarly on physical exam of the right  breast there is no fixed discrete mass. Ultrasound: Targeted ultrasound is performed in the right breast at 9 o'clock 2 cm from the nipple demonstrating a small island of tissue measuring 0.7 x 0.6 x 0.8 cm which likely corresponds to the asymmetry seen mammographically. There is no internal blood  flow. Targeted ultrasound the of the right breast at 10 o'clock 5 cm from the nipple demonstrates a small benign intramammary lymph node with cortical thickness of 0.1 cm. Targeted ultrasound of the right axilla demonstrates normal-appearing lymph nodes. Targeted ultrasound is performed in the left breast at 1 o'clock 2 cm from nipple at the palpable area of concern demonstrating normal fibroglandular tissue without a suspicious cystic or solid mass. IMPRESSION: 1. Right breast focal asymmetry without a sonographic correlate is probably benign. 2. At the palpable site of concern there is a focal asymmetry which is probably benign and most likely represents normal fibroglandular tissue. RECOMMENDATION: Diagnostic bilateral mammogram and possible ultrasound in 6 months. The patient was counseled to continue physical examination and to return sooner should the lump in his left breast enlarge. I have discussed the findings and recommendations with the patient. If applicable, a reminder letter will be sent to the patient regarding the next appointment. BI-RADS CATEGORY  3: Probably benign. Electronically Signed   By: Audie Pinto M.D.   On: 01/19/2020 15:39   MM DIAG BREAST TOMO BILATERAL  Result Date: 01/19/2020 CLINICAL DATA:  79 year old male presenting with a new palpable area of concern in the left breast. EXAM: DIGITAL DIAGNOSTIC BILATERAL MAMMOGRAM WITH CAD AND TOMO ULTRASOUND BILATERAL BREAST COMPARISON:  None. ACR Breast Density Category b: There are scattered areas of fibroglandular density. FINDINGS: Mammogram: Right breast: Spot compression tomosynthesis views were performed in addition to standard views  for an asymmetry in the outer right breast. On the additional views the focal asymmetry is less conspicuous and most likely represents fibroglandular tissue. Left breast: A skin BB marks the palpable site of concern in the upper outer left breast anterior depth. Spot compression tomosynthesis views were performed in addition to standard views. There is an asymmetry at the palpable site of concern which is less conspicuous on the spot imaging. Mammographic images were processed with CAD. On physical exam of the left breast at the palpable site of concern I feel a small area of tissue without a fixed discrete mass. Similarly on physical exam of the right breast there is no fixed discrete mass. Ultrasound: Targeted ultrasound is performed in the right breast at 9 o'clock 2 cm from the nipple demonstrating a small island of tissue measuring 0.7 x 0.6 x 0.8 cm which likely corresponds to the asymmetry seen mammographically. There is no internal blood flow. Targeted ultrasound the of the right breast at 10 o'clock 5 cm from the nipple demonstrates a small benign intramammary lymph node with cortical thickness of 0.1 cm. Targeted ultrasound of the right axilla demonstrates normal-appearing lymph nodes. Targeted ultrasound is performed in the left breast at 1 o'clock 2 cm from nipple at the palpable area of concern demonstrating normal fibroglandular tissue without a suspicious cystic or solid mass. IMPRESSION: 1. Right breast focal asymmetry without a sonographic correlate is probably benign. 2. At the palpable site of concern there is a focal asymmetry which is probably benign and most likely represents normal fibroglandular tissue. RECOMMENDATION: Diagnostic bilateral mammogram and possible ultrasound in 6 months. The patient was counseled to continue physical examination and to return sooner should the lump in his left breast enlarge. I have discussed the findings and recommendations with the patient. If applicable, a  reminder letter will be sent to the patient regarding the next appointment. BI-RADS CATEGORY  3: Probably benign. Electronically Signed   By: Audie Pinto M.D.   On: 01/19/2020 15:39    Assessment &  Plan:   Leodan was seen today for ear fullness.  Diagnoses and all orders for this visit:  Acute dysfunction of Eustachian tube, right- I recommended that he receive an injection of methylprednisolone and to start taking levocetirizine to hopefully reduce the pressure and fluid in the right middle ear. -     levocetirizine (XYZAL) 5 MG tablet; Take 1 tablet (5 mg total) by mouth every evening. 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 -     amoxicillin-clavulanate (AUGMENTIN) 875-125 MG tablet; Take 1 tablet by mouth 2 (two) times daily for 10 days. -     levocetirizine (XYZAL) 5 MG tablet; Take 1 tablet (5 mg total) by mouth every evening.   I am having [name] start on amoxicillin-clavulanate and levocetirizine. I am also having him maintain his aspirin EC, Potassium Gluconate, fluticasone, SYRINGE-NEEDLE (DISP) 3 ML, Omnitrope, ezetimibe, montelukast, liothyronine, hydrALAZINE, Livalo, ipratropium, acyclovir, Nandrolone Decanoate, testosterone cypionate, Armour Thyroid, Anoro Ellipta, anastrozole, and tamsulosin. We administered methylPREDNISolone acetate.  Meds ordered this encounter  Medications  . amoxicillin-clavulanate (AUGMENTIN) 875-125 MG tablet    Sig: Take 1 tablet by mouth 2 (two) times daily for 10 days.    Dispense:  20 tablet    Refill:  0  . levocetirizine (XYZAL) 5 MG tablet    Sig: Take 1 tablet (5 mg total) by mouth every evening.     Dispense:  90 tablet    Refill:  1  . methylPREDNISolone acetate (DEPO-MEDROL) injection 120 mg  . DISCONTD: methylPREDNISolone acetate (DEPO-MEDROL) injection 80 mg  . DISCONTD: methylPREDNISolone acetate (DEPO-MEDROL) injection 40 mg     Follow-up: Return in about 3 weeks (around 06/01/2020).  Scarlette Calico, MD

## 2020-05-13 ENCOUNTER — Telehealth: Payer: Self-pay | Admitting: *Deleted

## 2020-05-22 ENCOUNTER — Other Ambulatory Visit: Payer: Self-pay | Admitting: Internal Medicine

## 2020-05-22 DIAGNOSIS — E039 Hypothyroidism, unspecified: Secondary | ICD-10-CM

## 2020-05-23 ENCOUNTER — Other Ambulatory Visit: Payer: Self-pay | Admitting: Internal Medicine

## 2020-05-23 DIAGNOSIS — J4540 Moderate persistent asthma, uncomplicated: Secondary | ICD-10-CM

## 2020-06-14 ENCOUNTER — Other Ambulatory Visit: Payer: Self-pay | Admitting: Internal Medicine

## 2020-07-05 DIAGNOSIS — H3321 Serous retinal detachment, right eye: Secondary | ICD-10-CM | POA: Diagnosis not present

## 2020-07-05 DIAGNOSIS — H3521 Other non-diabetic proliferative retinopathy, right eye: Secondary | ICD-10-CM | POA: Diagnosis not present

## 2020-07-05 DIAGNOSIS — H35371 Puckering of macula, right eye: Secondary | ICD-10-CM | POA: Diagnosis not present

## 2020-07-05 DIAGNOSIS — Z48810 Encounter for surgical aftercare following surgery on the sense organs: Secondary | ICD-10-CM | POA: Diagnosis not present

## 2020-07-05 DIAGNOSIS — Z961 Presence of intraocular lens: Secondary | ICD-10-CM | POA: Diagnosis not present

## 2020-07-08 ENCOUNTER — Other Ambulatory Visit: Payer: Self-pay | Admitting: Internal Medicine

## 2020-07-08 DIAGNOSIS — I2510 Atherosclerotic heart disease of native coronary artery without angina pectoris: Secondary | ICD-10-CM

## 2020-07-08 DIAGNOSIS — E039 Hypothyroidism, unspecified: Secondary | ICD-10-CM

## 2020-07-08 DIAGNOSIS — E785 Hyperlipidemia, unspecified: Secondary | ICD-10-CM

## 2020-07-14 ENCOUNTER — Other Ambulatory Visit: Payer: Self-pay | Admitting: Internal Medicine

## 2020-07-26 ENCOUNTER — Inpatient Hospital Stay: Admission: RE | Admit: 2020-07-26 | Payer: Medicare Other | Source: Ambulatory Visit

## 2020-07-28 ENCOUNTER — Ambulatory Visit (INDEPENDENT_AMBULATORY_CARE_PROVIDER_SITE_OTHER): Payer: Medicare Other

## 2020-07-28 DIAGNOSIS — I495 Sick sinus syndrome: Secondary | ICD-10-CM | POA: Diagnosis not present

## 2020-07-29 LAB — CUP PACEART REMOTE DEVICE CHECK
Battery Remaining Longevity: 155 mo
Battery Voltage: 3.04 V
Brady Statistic AP VP Percent: 0.29 %
Brady Statistic AP VS Percent: 37.93 %
Brady Statistic AS VP Percent: 0.11 %
Brady Statistic AS VS Percent: 61.67 %
Brady Statistic RA Percent Paced: 38.35 %
Brady Statistic RV Percent Paced: 0.4 %
Date Time Interrogation Session: 20220222224013
Implantable Lead Implant Date: 20200527
Implantable Lead Implant Date: 20200527
Implantable Lead Location: 753859
Implantable Lead Location: 753860
Implantable Lead Model: 5076
Implantable Lead Model: 5076
Implantable Pulse Generator Implant Date: 20200527
Lead Channel Impedance Value: 361 Ohm
Lead Channel Impedance Value: 437 Ohm
Lead Channel Impedance Value: 437 Ohm
Lead Channel Impedance Value: 627 Ohm
Lead Channel Pacing Threshold Amplitude: 0.5 V
Lead Channel Pacing Threshold Amplitude: 0.5 V
Lead Channel Pacing Threshold Pulse Width: 0.4 ms
Lead Channel Pacing Threshold Pulse Width: 0.4 ms
Lead Channel Sensing Intrinsic Amplitude: 5.25 mV
Lead Channel Sensing Intrinsic Amplitude: 5.25 mV
Lead Channel Sensing Intrinsic Amplitude: 9.5 mV
Lead Channel Sensing Intrinsic Amplitude: 9.5 mV
Lead Channel Setting Pacing Amplitude: 1.5 V
Lead Channel Setting Pacing Amplitude: 2.5 V
Lead Channel Setting Pacing Pulse Width: 0.4 ms
Lead Channel Setting Sensing Sensitivity: 2 mV

## 2020-08-05 NOTE — Progress Notes (Signed)
Remote pacemaker transmission.   

## 2020-08-14 DIAGNOSIS — R339 Retention of urine, unspecified: Secondary | ICD-10-CM | POA: Diagnosis not present

## 2020-08-14 DIAGNOSIS — R319 Hematuria, unspecified: Secondary | ICD-10-CM | POA: Diagnosis not present

## 2020-08-16 ENCOUNTER — Other Ambulatory Visit: Payer: Self-pay | Admitting: Internal Medicine

## 2020-08-17 DIAGNOSIS — N390 Urinary tract infection, site not specified: Secondary | ICD-10-CM | POA: Diagnosis not present

## 2020-08-17 DIAGNOSIS — I10 Essential (primary) hypertension: Secondary | ICD-10-CM | POA: Diagnosis not present

## 2020-08-17 DIAGNOSIS — Z466 Encounter for fitting and adjustment of urinary device: Secondary | ICD-10-CM | POA: Diagnosis not present

## 2020-08-19 DIAGNOSIS — R338 Other retention of urine: Secondary | ICD-10-CM | POA: Diagnosis not present

## 2020-08-19 DIAGNOSIS — I10 Essential (primary) hypertension: Secondary | ICD-10-CM | POA: Diagnosis not present

## 2020-08-22 DIAGNOSIS — Z466 Encounter for fitting and adjustment of urinary device: Secondary | ICD-10-CM | POA: Diagnosis not present

## 2020-09-23 ENCOUNTER — Other Ambulatory Visit: Payer: Self-pay | Admitting: Internal Medicine

## 2020-09-23 ENCOUNTER — Telehealth: Payer: Self-pay | Admitting: Internal Medicine

## 2020-09-23 DIAGNOSIS — E291 Testicular hypofunction: Secondary | ICD-10-CM

## 2020-09-23 MED ORDER — NANDROLONE DECANOATE 200 MG/ML IJ OIL: 100.0000 mg | TOPICAL_OIL | INTRAMUSCULAR | 1 refills | Status: DC

## 2020-09-23 NOTE — Telephone Encounter (Signed)
Nandrolone Decanoate 200 MG/ML [place], [place] [facility] Phone:  [phone]  Fax:  [phone]     Last seen- 12.07.21 Next aptr- n/a

## 2020-10-07 ENCOUNTER — Encounter: Payer: Self-pay | Admitting: Internal Medicine

## 2020-10-07 ENCOUNTER — Ambulatory Visit (INDEPENDENT_AMBULATORY_CARE_PROVIDER_SITE_OTHER): Payer: Medicare Other | Admitting: Internal Medicine

## 2020-10-07 ENCOUNTER — Other Ambulatory Visit: Payer: Self-pay

## 2020-10-07 VITALS — BP 140/74 | HR 60 | Temp 98.3°F | Ht 72.0 in | Wt 207.0 lb

## 2020-10-07 DIAGNOSIS — N1831 Chronic kidney disease, stage 3a: Secondary | ICD-10-CM

## 2020-10-07 DIAGNOSIS — I2510 Atherosclerotic heart disease of native coronary artery without angina pectoris: Secondary | ICD-10-CM

## 2020-10-07 DIAGNOSIS — L049 Acute lymphadenitis, unspecified: Secondary | ICD-10-CM | POA: Diagnosis not present

## 2020-10-07 DIAGNOSIS — R590 Localized enlarged lymph nodes: Secondary | ICD-10-CM | POA: Insufficient documentation

## 2020-10-07 DIAGNOSIS — Z23 Encounter for immunization: Secondary | ICD-10-CM | POA: Diagnosis not present

## 2020-10-07 LAB — BASIC METABOLIC PANEL
BUN: 27 mg/dL — ABNORMAL HIGH (ref 6–23)
CO2: 30 mEq/L (ref 19–32)
Calcium: 9.9 mg/dL (ref 8.4–10.5)
Chloride: 103 mEq/L (ref 96–112)
Creatinine, Ser: 1.39 mg/dL (ref 0.40–1.50)
GFR: 47.96 mL/min — ABNORMAL LOW (ref >60.00)
Glucose, Bld: 92 mg/dL (ref 70–99)
Potassium: 4.2 mEq/L (ref 3.5–5.1)
Sodium: 140 mEq/L (ref 135–145)

## 2020-10-07 LAB — CBC WITH DIFFERENTIAL/PLATELET
Basophils Absolute: 0 10*3/uL (ref 0.0–0.1)
Basophils Relative: 0.7 % (ref 0.0–3.0)
Eosinophils Absolute: 0.1 10*3/uL (ref 0.0–0.7)
Eosinophils Relative: 1.7 % (ref 0.0–5.0)
HCT: 49 % (ref 39.0–52.0)
Hemoglobin: 16.3 g/dL (ref 13.0–17.0)
Lymphocytes Relative: 27.8 % (ref 12.0–46.0)
Lymphs Abs: 2 10*3/uL (ref 0.7–4.0)
MCHC: 33.2 g/dL (ref 30.0–36.0)
MCV: 92.2 fl (ref 78.0–100.0)
Monocytes Absolute: 1.3 10*3/uL — ABNORMAL HIGH (ref 0.1–1.0)
Monocytes Relative: 17.8 % — ABNORMAL HIGH (ref 3.0–12.0)
Neutro Abs: 3.7 10*3/uL (ref 1.4–7.7)
Neutrophils Relative %: 52 % (ref 43.0–77.0)
Platelets: 251 10*3/uL (ref 150.0–400.0)
RBC: 5.32 Mil/uL (ref 4.22–5.81)
RDW: 17.4 % — ABNORMAL HIGH (ref 11.5–15.5)
WBC: 7 10*3/uL (ref 4.0–10.5)

## 2020-10-07 LAB — SEDIMENTATION RATE: Sed Rate: 14 mm/hr (ref 0–20)

## 2020-10-07 MED ORDER — CEFDINIR 300 MG PO CAPS: 300.0000 mg | ORAL_CAPSULE | Freq: Two times a day (BID) | ORAL | 0 refills | Status: AC

## 2020-10-07 NOTE — Patient Instructions (Signed)
Lymphadenopathy  Lymphadenopathy means that your lymph glands are swollen or larger than normal. Lymph glands, also called lymph nodes, are collections of tissue that filter excess fluid, bacteria, viruses, and waste from your bloodstream. They are part of your body's disease-fighting system (immune system), which protects your body from germs. There may be different causes of lymphadenopathy, depending on where it is in your body. Some types go away on their own. Lymphadenopathy can occur anywhere that you have lymph glands, including these areas:  Neck (cervical lymphadenopathy).  Chest (mediastinal lymphadenopathy).  Lungs (hilar lymphadenopathy).  Underarms (axillary lymphadenopathy).  Groin (inguinal lymphadenopathy). When your immune system responds to germs, infection-fighting cells and fluid build up in your lymph glands. This causes some swelling and enlargement. If the lymph nodes do not go back to normal size after you have an infection or disease, your health care provider may do tests. These tests help to monitor your condition and find the reason why the glands are still swollen and enlarged. Follow these instructions at home:  Get plenty of rest.  Your health care provider may recommend over-the-counter medicines for pain. Take over-the-counter and prescription medicines only as told by your health care provider.  If directed, apply heat to swollen lymph glands as often as told by your health care provider. Use the heat source that your health care provider recommends, such as a moist heat pack or a heating pad. ? Place a towel between your skin and the heat source. ? Leave the heat on for 20-30 minutes. ? Remove the heat if your skin turns bright red. This is especially important if you are unable to feel pain, heat, or cold. You may have a greater risk of getting burned.  Check your affected lymph glands every day for changes. Check other lymph gland areas as told by your  health care provider. Check for changes such as: ? More swelling. ? Sudden increase in size. ? Redness or pain. ? Hardness.  Keep all follow-up visits. This is important.   Contact a health care provider if you have:  Lymph glands that: ? Are still swollen after 2 weeks. ? Have suddenly gotten bigger or the swelling spreads. ? Are red, painful, or hard.  Fluid leaking from the skin near an enlarged lymph gland.  Problems with breathing.  A fever, chills, or night sweats.  Fatigue.  A sore throat.  Pain in your abdomen.  Weight loss. Get help right away if you have:  Severe pain.  Chest pain.  Shortness of breath. These symptoms may represent a serious problem that is an emergency. Do not wait to see if the symptoms will go away. Get medical help right away. Call your local emergency services (911 in the [place] Do not drive yourself to the hospital. Summary  Lymphadenopathy means that your lymph glands are swollen or larger than normal.  Lymph glands, also called lymph nodes, are collections of tissue that filter excess fluid, bacteria, viruses, and waste from the bloodstream. They are part of your body's disease-fighting system (immune system).  Lymphadenopathy can occur anywhere that you have lymph glands.  If the lymph nodes do not go back to normal size after you have an infection or disease, your health care provider may do tests to monitor your condition and find the reason why the glands are still swollen and enlarged.  Check your affected lymph glands every day for changes. Check other lymph gland areas as told by your health care provider. This information   is not intended to replace advice given to you by your health care provider. Make sure you discuss any questions you have with your health care provider. Document Revised: 03/17/2020 Document Reviewed: 03/17/2020 [employer]vier Patient Education  2021 [employer]  

## 2020-10-07 NOTE — Progress Notes (Signed)
Subjective:  Patient ID: [name] Glass, male    DOB: 1941-03-29  Age: 80 y.o. MRN: [id number]  CC: Office Visit (Throat hurts x3 years off and on. Per patient has difficulty with swallowing and breathing.)  This visit occurred during the SARS-CoV-2 public health emergency.  Safety protocols were in place, including screening questions prior to the visit, additional usage of staff PPE, and extensive cleaning of exam room while observing appropriate contact time as indicated for disinfecting solutions.    HPI [name] Glass presents for f/up -   He complains of a 73-month history of worsening bilateral anterior neck pain.  The pain has vacillated from the right to the left.  He has palpable lymphadenopathy that is uncomfortable.  He also complains of chronic pharyngitis.  He has chronic laryngitis, GERD, and trouble swallowing.  Outpatient Medications Prior to Visit  Medication Sig Dispense Refill  . acyclovir (ZOVIRAX) 200 MG capsule TAKE 1 CAPSULE BY MOUTH THREE TIMES A DAY 270 capsule 0  . anastrozole (ARIMIDEX) 1 MG tablet TAKE 1 TABLET (1 MG TOTAL) BY MOUTH 2 (TWO) TIMES A WEEK. 24 tablet 1  . ARMOUR THYROID 60 MG tablet TAKE 2 TABLETS BY MOUTH EVERY DAY 180 tablet 0  . aspirin EC 81 MG tablet Take 1 tablet (81 mg total) by mouth daily. 90 tablet 3  . ezetimibe (ZETIA) 10 MG tablet TAKE 1 TABLET BY MOUTH EVERY DAY 90 tablet 1  . finasteride (PROSCAR) 5 MG tablet TAKE 1 TABLET BY MOUTH EVERY DAY 90 tablet 1  . fluticasone (FLONASE) 50 MCG/ACT nasal spray SPRAY 2 SPRAYS INTO EACH NOSTRIL EVERY DAY 48 mL 1  . hydrALAZINE (APRESOLINE) 25 MG tablet TAKE 1 TABLET BY MOUTH THREE TIMES A DAY 270 tablet 1  . ipratropium (ATROVENT) 0.03 % nasal spray Place 2 sprays into both nostrils 3 (three) times daily as needed for rhinitis. 30 mL 12  . levocetirizine (XYZAL) 5 MG tablet Take 1 tablet (5 mg total) by mouth every evening. 90 tablet 1  . liothyronine (CYTOMEL) 25 MCG tablet TAKE 1 TABLET BY  MOUTH EVERY DAY IN THE MORNING 90 tablet 1  . LIVALO 4 MG TABS TAKE 1 TABLET BY MOUTH EVERY DAY 90 tablet 1  . montelukast (SINGULAIR) 10 MG tablet TAKE 1 TABLET BY MOUTH EVERYDAY AT BEDTIME 90 tablet 1  . Nandrolone Decanoate 200 MG/ML OIL Inject 100 mg as directed once a week. 10 mL 1  . OMNITROPE 5.8 MG SOLR ADD 1.14 ML DILUENT PER VIAL AND INJECT 0.15ML SUBCUTANEOUSLY MONDAY THROUGH FRIDAY AT BEDTIME. REFRIGERATE DO NOT FREEZE. 10 each 0  . Potassium Gluconate 2.5 MEQ TABS Take by mouth.    . SYRINGE-NEEDLE, DISP, 3 ML 23G X 1" 3 ML MISC Use to inject testosterone as prescribed. 50 each 0  . tamsulosin (FLOMAX) 0.4 MG CAPS capsule TAKE 1 CAPSULE BY MOUTH EVERY DAY IN THE MORNING 90 capsule 1  . testosterone cypionate (DEPOTESTOSTERONE CYPIONATE) 200 MG/ML injection Inject 1.25 mls (250 mg total) into the muscle every 7 (seven) days. 20 mL 4  . umeclidinium-vilanterol (ANORO ELLIPTA) 62.5-25 MCG/INH AEPB Inhale 1 puff into the lungs daily. 60 each 11  . Somatropin (OMNITROPE) 5 MG/1.5ML SOCT Inject 0.15 mLs into the skin daily. Add 1.14 mL of sterile diluent to each vial of omnitrope. Inject Monday thru Friday qhs. 10 mL 1   No facility-administered medications prior to visit.    ROS Review of Systems  Constitutional: Negative.  Negative  for appetite change, diaphoresis and fatigue.  HENT: Positive for trouble swallowing and voice change.   Eyes: Negative.   Respiratory: Negative for cough, chest tightness, shortness of breath and wheezing.   Cardiovascular: Negative for chest pain, palpitations and leg swelling.  Gastrointestinal: Negative for abdominal pain, constipation, diarrhea, nausea and vomiting.  Endocrine: Negative.   Genitourinary: Negative.  Negative for difficulty urinating.  Musculoskeletal: Positive for neck pain. Negative for back pain.  Skin: Negative for rash.  Neurological: Negative.  Negative for dizziness and weakness.  Hematological: Negative for adenopathy. Does  not bruise/bleed easily.  Psychiatric/Behavioral: Negative.     Objective:  BP 140/74 (BP Location: Left Arm, Patient Position: Sitting, Cuff Size: Large)   Pulse 60   Temp 98.3 F (36.8 C) (Oral)   Ht 6' (1.829 m)   Wt 207 lb (93.9 kg)   SpO2 94%   BMI 28.07 kg/m   BP Readings from Last 3 Encounters:  10/07/20 140/74  05/11/20 (!) 148/82  03/22/20 122/60    Wt Readings from Last 3 Encounters:  10/07/20 207 lb (93.9 kg)  05/11/20 216 lb (98 kg)  03/22/20 216 lb 6.4 oz (98.2 kg)    Physical Exam Vitals reviewed.  HENT:     Mouth/Throat:     Mouth: Mucous membranes are moist.     Tongue: No lesions.     Palate: No mass.     Pharynx: Posterior oropharyngeal erythema present. No pharyngeal swelling or oropharyngeal exudate.     Tonsils: No tonsillar exudate.  Eyes:     General: No scleral icterus.    Conjunctiva/sclera: Conjunctivae normal.  Neck:     Thyroid: No thyroid mass, thyromegaly or thyroid tenderness.     Comments: Left anterior cervical lymph node is 3 x 4 cm.  It is mildly tender but freely mobile and nonfluctuant. Cardiovascular:     Rate and Rhythm: Normal rate and regular rhythm.     Pulses:          Carotid pulses are 2+ on the right side and 2+ on the left side.    Heart sounds: Murmur heard.   Systolic murmur is present with a grade of 1/6.  No diastolic murmur is present.   Chest:  Breasts:     Right: No axillary adenopathy or supraclavicular adenopathy.     Left: No axillary adenopathy or supraclavicular adenopathy.    Musculoskeletal:     Cervical back: Neck supple.  Lymphadenopathy:     Head:     Right side of head: No submandibular or occipital adenopathy.     Left side of head: No submandibular or occipital adenopathy.     Cervical: Cervical adenopathy present.     Right cervical: No superficial, deep or posterior cervical adenopathy.    Left cervical: Superficial cervical adenopathy present. No deep or posterior cervical  adenopathy.     Upper Body:     Right upper body: No supraclavicular or axillary adenopathy.     Left upper body: No supraclavicular or axillary adenopathy.     Lower Body: No right inguinal adenopathy. No left inguinal adenopathy.  Neurological:     Mental Status: He is alert.     Lab Results  Component Value Date   WBC 7.0 10/07/2020   HGB 16.3 10/07/2020   HCT 49.0 10/07/2020   PLT 251.0 10/07/2020   GLUCOSE 92 10/07/2020   CHOL 154 12/22/2019   TRIG 182 (H) 12/22/2019   HDL 46 12/22/2019   [place]  80 12/22/2019   ALT 21 12/22/2019   AST 21 12/22/2019   NA 140 10/07/2020   K 4.2 10/07/2020   CL 103 10/07/2020   CREATININE 1.39 10/07/2020   BUN 27 (H) 10/07/2020   CO2 30 10/07/2020   TSH 0.07 (L) 12/22/2019   PSA 4.4 (H) 12/22/2019   INR 0.96 07/28/2014   HGBA1C 6.1 07/21/2017    US BREAST LTD UNI LEFT INC AXILLA  Result Date: 01/19/2020 CLINICAL DATA:  80 year old male presenting with a new palpable area of concern in the left breast. EXAM: DIGITAL DIAGNOSTIC BILATERAL MAMMOGRAM WITH CAD AND TOMO ULTRASOUND BILATERAL BREAST COMPARISON:  None. ACR Breast Density Category b: There are scattered areas of fibroglandular density. FINDINGS: Mammogram: Right breast: Spot compression tomosynthesis views were performed in addition to standard views for an asymmetry in the outer right breast. On the additional views the focal asymmetry is less conspicuous and most likely represents fibroglandular tissue. Left breast: A skin BB marks the palpable site of concern in the upper outer left breast anterior depth. Spot compression tomosynthesis views were performed in addition to standard views. There is an asymmetry at the palpable site of concern which is less conspicuous on the spot imaging. Mammographic images were processed with CAD. On physical exam of the left breast at the palpable site of concern I feel a small area of tissue without a fixed discrete mass. Similarly on physical exam  of the right breast there is no fixed discrete mass. Ultrasound: Targeted ultrasound is performed in the right breast at 9 o'clock 2 cm from the nipple demonstrating a small island of tissue measuring 0.7 x 0.6 x 0.8 cm which likely corresponds to the asymmetry seen mammographically. There is no internal blood flow. Targeted ultrasound the of the right breast at 10 o'clock 5 cm from the nipple demonstrates a small benign intramammary lymph node with cortical thickness of 0.1 cm. Targeted ultrasound of the right axilla demonstrates normal-appearing lymph nodes. Targeted ultrasound is performed in the left breast at 1 o'clock 2 cm from nipple at the palpable area of concern demonstrating normal fibroglandular tissue without a suspicious cystic or solid mass. IMPRESSION: 1. Right breast focal asymmetry without a sonographic correlate is probably benign. 2. At the palpable site of concern there is a focal asymmetry which is probably benign and most likely represents normal fibroglandular tissue. RECOMMENDATION: Diagnostic bilateral mammogram and possible ultrasound in 6 months. The patient was counseled to continue physical examination and to return sooner should the lump in his left breast enlarge. I have discussed the findings and recommendations with the patient. If applicable, a reminder letter will be sent to the patient regarding the next appointment. BI-RADS CATEGORY  3: Probably benign. Electronically Signed   By: Audie Pinto M.D.   On: 01/19/2020 15:39   US BREAST LTD UNI RIGHT INC AXILLA  Result Date: 01/19/2020 CLINICAL DATA:  80 year old male presenting with a new palpable area of concern in the left breast. EXAM: DIGITAL DIAGNOSTIC BILATERAL MAMMOGRAM WITH CAD AND TOMO ULTRASOUND BILATERAL BREAST COMPARISON:  None. ACR Breast Density Category b: There are scattered areas of fibroglandular density. FINDINGS: Mammogram: Right breast: Spot compression tomosynthesis views were performed in addition to  standard views for an asymmetry in the outer right breast. On the additional views the focal asymmetry is less conspicuous and most likely represents fibroglandular tissue. Left breast: A skin BB marks the palpable site of concern in the upper outer left breast anterior depth. Spot compression tomosynthesis  views were performed in addition to standard views. There is an asymmetry at the palpable site of concern which is less conspicuous on the spot imaging. Mammographic images were processed with CAD. On physical exam of the left breast at the palpable site of concern I feel a small area of tissue without a fixed discrete mass. Similarly on physical exam of the right breast there is no fixed discrete mass. Ultrasound: Targeted ultrasound is performed in the right breast at 9 o'clock 2 cm from the nipple demonstrating a small island of tissue measuring 0.7 x 0.6 x 0.8 cm which likely corresponds to the asymmetry seen mammographically. There is no internal blood flow. Targeted ultrasound the of the right breast at 10 o'clock 5 cm from the nipple demonstrates a small benign intramammary lymph node with cortical thickness of 0.1 cm. Targeted ultrasound of the right axilla demonstrates normal-appearing lymph nodes. Targeted ultrasound is performed in the left breast at 1 o'clock 2 cm from nipple at the palpable area of concern demonstrating normal fibroglandular tissue without a suspicious cystic or solid mass. IMPRESSION: 1. Right breast focal asymmetry without a sonographic correlate is probably benign. 2. At the palpable site of concern there is a focal asymmetry which is probably benign and most likely represents normal fibroglandular tissue. RECOMMENDATION: Diagnostic bilateral mammogram and possible ultrasound in 6 months. The patient was counseled to continue physical examination and to return sooner should the lump in his left breast enlarge. I have discussed the findings and recommendations with the patient. If  applicable, a reminder letter will be sent to the patient regarding the next appointment. BI-RADS CATEGORY  3: Probably benign. Electronically Signed   By: Audie Pinto M.D.   On: 01/19/2020 15:39   MM DIAG BREAST TOMO BILATERAL  Result Date: 01/19/2020 CLINICAL DATA:  80 year old male presenting with a new palpable area of concern in the left breast. EXAM: DIGITAL DIAGNOSTIC BILATERAL MAMMOGRAM WITH CAD AND TOMO ULTRASOUND BILATERAL BREAST COMPARISON:  None. ACR Breast Density Category b: There are scattered areas of fibroglandular density. FINDINGS: Mammogram: Right breast: Spot compression tomosynthesis views were performed in addition to standard views for an asymmetry in the outer right breast. On the additional views the focal asymmetry is less conspicuous and most likely represents fibroglandular tissue. Left breast: A skin BB marks the palpable site of concern in the upper outer left breast anterior depth. Spot compression tomosynthesis views were performed in addition to standard views. There is an asymmetry at the palpable site of concern which is less conspicuous on the spot imaging. Mammographic images were processed with CAD. On physical exam of the left breast at the palpable site of concern I feel a small area of tissue without a fixed discrete mass. Similarly on physical exam of the right breast there is no fixed discrete mass. Ultrasound: Targeted ultrasound is performed in the right breast at 9 o'clock 2 cm from the nipple demonstrating a small island of tissue measuring 0.7 x 0.6 x 0.8 cm which likely corresponds to the asymmetry seen mammographically. There is no internal blood flow. Targeted ultrasound the of the right breast at 10 o'clock 5 cm from the nipple demonstrates a small benign intramammary lymph node with cortical thickness of 0.1 cm. Targeted ultrasound of the right axilla demonstrates normal-appearing lymph nodes. Targeted ultrasound is performed in the left breast at 1  o'clock 2 cm from nipple at the palpable area of concern demonstrating normal fibroglandular tissue without a suspicious cystic or solid mass. IMPRESSION:  1. Right breast focal asymmetry without a sonographic correlate is probably benign. 2. At the palpable site of concern there is a focal asymmetry which is probably benign and most likely represents normal fibroglandular tissue. RECOMMENDATION: Diagnostic bilateral mammogram and possible ultrasound in 6 months. The patient was counseled to continue physical examination and to return sooner should the lump in his left breast enlarge. I have discussed the findings and recommendations with the patient. If applicable, a reminder letter will be sent to the patient regarding the next appointment. BI-RADS CATEGORY  3: Probably benign. Electronically Signed   By: Audie Pinto M.D.   On: 01/19/2020 15:39    Assessment & Plan:   [name] Glass was seen today for office visit.  Diagnoses and all orders for this visit:  Anterior cervical lymphadenopathy- His CBC and sed rate are normal.  I recommended that he undergo a CT with contrast to see if there is a little malignant process. -     Basic metabolic panel; Future -     CBC with Differential/Platelet; Future -     Sedimentation rate; Future -     Sedimentation rate -     CBC with Differential/Platelet -     Basic metabolic panel -     CT Soft Tissue Neck W Contrast; Future  Acute lymphadenitis- Will treat with a broad-spectrum cephalosporin antibiotic. -     Basic metabolic panel; Future -     CBC with Differential/Platelet; Future -     Sedimentation rate; Future -     cefdinir (OMNICEF) 300 MG capsule; Take 1 capsule (300 mg total) by mouth 2 (two) times daily for 10 days. -     Sedimentation rate -     CBC with Differential/Platelet -     Basic metabolic panel -     CT Soft Tissue Neck W Contrast; Future  Stage 3a chronic kidney disease ([place])- His renal function has declined slightly.  He will  avoid nephrotoxic agents.   I am having [name] start on cefdinir. I am also having him maintain his aspirin EC, Potassium Gluconate, SYRINGE-NEEDLE (DISP) 3 ML, ipratropium, testosterone cypionate, Anoro Ellipta, anastrozole, tamsulosin, levocetirizine, liothyronine, montelukast, fluticasone, hydrALAZINE, ezetimibe, acyclovir, Armour Thyroid, Livalo, finasteride, Omnitrope, and Nandrolone Decanoate.  Meds ordered this encounter  Medications  . cefdinir (OMNICEF) 300 MG capsule    Sig: Take 1 capsule (300 mg total) by mouth 2 (two) times daily for 10 days.    Dispense:  20 capsule    Refill:  0     Follow-up: Return in about 3 weeks (around 10/28/2020).  Scarlette Calico, MD

## 2020-10-12 ENCOUNTER — Telehealth: Payer: Self-pay

## 2020-10-12 NOTE — Telephone Encounter (Signed)
Approved Effective from 10/12/2020 through 10/12/2021.

## 2020-10-12 NOTE — Telephone Encounter (Signed)
Key: [id number]

## 2020-10-13 ENCOUNTER — Ambulatory Visit
Admission: RE | Admit: 2020-10-13 | Discharge: 2020-10-13 | Disposition: A | Discharge status: Home / Self Care | Payer: Medicare Other | Source: Ambulatory Visit | Attending: Internal Medicine | Admitting: Internal Medicine

## 2020-10-13 DIAGNOSIS — L049 Acute lymphadenitis, unspecified: Secondary | ICD-10-CM | POA: Diagnosis not present

## 2020-10-13 DIAGNOSIS — R590 Localized enlarged lymph nodes: Secondary | ICD-10-CM

## 2020-10-13 MED ORDER — IOPAMIDOL (ISOVUE-300) INJECTION 61%
75.0000 mL | Freq: Once | INTRAVENOUS | Status: AC | PRN
  Administered 2020-10-13: 75 mL via INTRAVENOUS

## 2020-10-14 ENCOUNTER — Other Ambulatory Visit: Payer: Self-pay | Admitting: Internal Medicine

## 2020-10-14 ENCOUNTER — Other Ambulatory Visit: Payer: Medicare Other

## 2020-10-14 DIAGNOSIS — G9589 Other specified diseases of spinal cord: Secondary | ICD-10-CM | POA: Insufficient documentation

## 2020-10-15 ENCOUNTER — Other Ambulatory Visit: Payer: Self-pay | Admitting: Internal Medicine

## 2020-10-15 DIAGNOSIS — J387 Other diseases of larynx: Secondary | ICD-10-CM

## 2020-10-15 DIAGNOSIS — G9589 Other specified diseases of spinal cord: Secondary | ICD-10-CM

## 2020-10-15 MED ORDER — METHYLPREDNISOLONE 4 MG PO TBPK: ORAL_TABLET | ORAL | 0 refills | Status: DC

## 2020-10-18 ENCOUNTER — Telehealth: Payer: Self-pay | Admitting: Internal Medicine

## 2020-10-18 NOTE — Telephone Encounter (Signed)
Pt would like an update on the ENT referral in regards to the growth in this throat. The referral was placed on 5/13.   The only note that I see was the pt requesting not to see Dr. Lucia Gaskins.   Please assist.

## 2020-10-18 NOTE — Telephone Encounter (Signed)
Patient called and is requesting a call back in regards to an ENT referral. He can be reached at [phone]. Please advise

## 2020-10-20 ENCOUNTER — Other Ambulatory Visit: Payer: Self-pay | Admitting: Internal Medicine

## 2020-10-20 DIAGNOSIS — J387 Other diseases of larynx: Secondary | ICD-10-CM

## 2020-10-20 NOTE — Telephone Encounter (Signed)
    Spouse requesting ENT referral be changed to an urgent referral. She was offered an ENT appointment on 6/6, but feels patient needs sooner appointment  Requesting NEW referral to office that has sooner appointment. Advised spouse specialist appointment are typically booking much further out.  Please advise

## 2020-10-21 ENCOUNTER — Telehealth: Payer: Self-pay | Admitting: Internal Medicine

## 2020-10-21 ENCOUNTER — Encounter: Payer: Self-pay | Admitting: Internal Medicine

## 2020-10-21 NOTE — Telephone Encounter (Signed)
Sharyn Lull from [facility] has called in regards to the patients referral.   He is requesting a copy of the last CT scan reports and any notes we may have regarding his neck mass.  Fax number: [phone]

## 2020-10-21 NOTE — Telephone Encounter (Signed)
Patient calling, states the ENT he was referred to cant get him in until mid June so he called his connections at [facility] and he was able to get in with Dr. Ponce de Leon Callas at [facility] center on Tuesday of next week and he would like for [place] to do a referral to them

## 2020-10-21 NOTE — Telephone Encounter (Signed)
Patients wife calling, states when she talked to Dr. Ronnald Ramp about going to [place] he said he would write a note explaining why they should not go. Requesting a note so they can get a refund on their flights.

## 2020-10-21 NOTE — Telephone Encounter (Signed)
Noted  

## 2020-10-22 ENCOUNTER — Ambulatory Visit: Payer: Medicare Other | Admitting: Cardiovascular Disease

## 2020-10-22 DIAGNOSIS — J387 Other diseases of larynx: Secondary | ICD-10-CM | POA: Diagnosis not present

## 2020-10-26 DIAGNOSIS — J387 Other diseases of larynx: Secondary | ICD-10-CM | POA: Diagnosis not present

## 2020-10-26 DIAGNOSIS — Z6828 Body mass index (BMI) 28.0-28.9, adult: Secondary | ICD-10-CM | POA: Diagnosis not present

## 2020-10-26 DIAGNOSIS — Z79899 Other long term (current) drug therapy: Secondary | ICD-10-CM | POA: Diagnosis not present

## 2020-10-26 DIAGNOSIS — Z79811 Long term (current) use of aromatase inhibitors: Secondary | ICD-10-CM | POA: Diagnosis not present

## 2020-10-26 DIAGNOSIS — Z885 Allergy status to narcotic agent status: Secondary | ICD-10-CM | POA: Diagnosis not present

## 2020-10-26 DIAGNOSIS — Z888 Allergy status to other drugs, medicaments and biological substances status: Secondary | ICD-10-CM | POA: Diagnosis not present

## 2020-10-26 DIAGNOSIS — R490 Dysphonia: Secondary | ICD-10-CM | POA: Diagnosis not present

## 2020-10-26 DIAGNOSIS — R1310 Dysphagia, unspecified: Secondary | ICD-10-CM | POA: Diagnosis not present

## 2020-10-26 DIAGNOSIS — N1831 Chronic kidney disease, stage 3a: Secondary | ICD-10-CM | POA: Diagnosis not present

## 2020-10-26 DIAGNOSIS — Z882 Allergy status to sulfonamides status: Secondary | ICD-10-CM | POA: Diagnosis not present

## 2020-10-26 DIAGNOSIS — Z7982 Long term (current) use of aspirin: Secondary | ICD-10-CM | POA: Diagnosis not present

## 2020-10-26 DIAGNOSIS — G4730 Sleep apnea, unspecified: Secondary | ICD-10-CM | POA: Diagnosis not present

## 2020-10-26 DIAGNOSIS — R221 Localized swelling, mass and lump, neck: Secondary | ICD-10-CM | POA: Diagnosis not present

## 2020-10-26 DIAGNOSIS — Z955 Presence of coronary angioplasty implant and graft: Secondary | ICD-10-CM | POA: Diagnosis not present

## 2020-10-26 DIAGNOSIS — I129 Hypertensive chronic kidney disease with stage 1 through stage 4 chronic kidney disease, or unspecified chronic kidney disease: Secondary | ICD-10-CM | POA: Diagnosis not present

## 2020-10-27 ENCOUNTER — Other Ambulatory Visit: Payer: Self-pay

## 2020-10-27 ENCOUNTER — Ambulatory Visit (INDEPENDENT_AMBULATORY_CARE_PROVIDER_SITE_OTHER): Payer: Medicare Other

## 2020-10-27 ENCOUNTER — Encounter: Payer: Self-pay | Admitting: Cardiovascular Disease

## 2020-10-27 ENCOUNTER — Ambulatory Visit (INDEPENDENT_AMBULATORY_CARE_PROVIDER_SITE_OTHER): Payer: Medicare Other | Admitting: Cardiovascular Disease

## 2020-10-27 DIAGNOSIS — I350 Nonrheumatic aortic (valve) stenosis: Secondary | ICD-10-CM | POA: Diagnosis not present

## 2020-10-27 DIAGNOSIS — I495 Sick sinus syndrome: Secondary | ICD-10-CM | POA: Diagnosis not present

## 2020-10-27 DIAGNOSIS — E785 Hyperlipidemia, unspecified: Secondary | ICD-10-CM

## 2020-10-27 DIAGNOSIS — I10 Essential (primary) hypertension: Secondary | ICD-10-CM

## 2020-10-27 DIAGNOSIS — I712 Thoracic aortic aneurysm, without rupture, unspecified: Secondary | ICD-10-CM

## 2020-10-27 DIAGNOSIS — I2510 Atherosclerotic heart disease of native coronary artery without angina pectoris: Secondary | ICD-10-CM

## 2020-10-27 NOTE — Patient Instructions (Signed)
Medication Instructions:  Your physician recommends that you continue on your current medications as directed. Please refer to the Current Medication list given to you today.  *If you need a refill on your cardiac medications before your next appointment, please call your pharmacy*   Testing/Procedures: Your physician has requested that you have an echocardiogram. Echocardiography is a painless test that uses sound waves to create images of your heart. It provides your doctor with information about the size and shape of your heart and how well your heart's chambers and valves are working. This procedure takes approximately one hour. There are no restrictions for this procedure. This procedure is done at [address]. To be done in September 2022.   Follow-Up: At [facility], you and your health needs are our priority.  As part of our continuing mission to provide you with exceptional heart care, we have created designated Provider Care Teams.  These Care Teams include your primary Cardiologist (physician) and Advanced Practice Providers (APPs -  Physician Assistants and Nurse Practitioners) who all work together to provide you with the care you need, when you need it.  We recommend signing up for the patient portal called "MyChart".  Sign up information is provided on this After Visit Summary.  MyChart is used to connect with patients for Virtual Visits (Telemedicine).  Patients are able to view lab/test results, encounter notes, upcoming appointments, etc.  Non-urgent messages can be sent to your provider as well.   To learn more about what you can do with MyChart, go to [email or web address].    Your next appointment:   6 month(s)  The format for your next appointment:   In Person  Provider:   Quay Burow, MD

## 2020-10-27 NOTE — Assessment & Plan Note (Signed)
Hyperlipidemia on high-dose Livalo and Zinamide with lipid profile performed 12/22/2019 revealing total cholesterol 154, LDL of 80 and HDL 46.

## 2020-10-27 NOTE — Assessment & Plan Note (Signed)
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  He was in [facility] and had a witnessed syncope.  He underwent cardiac catheterization by Dr. Earlie Counts radially revealing what he describes as patent stents in circumflex and RCA which I implanted in 06 with high-grade calcified LAD stenosis.  He underwent staged atherectomy and DES.  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

## 2020-10-27 NOTE — Assessment & Plan Note (Signed)
History of essential hypertension a blood pressure measured today 140/70.  He is on hydralazine.

## 2020-10-27 NOTE — Assessment & Plan Note (Signed)
History of moderate aortic stenosis with 2D echo performed 02/23/2020 revealing an aortic valve area of 2.83 cm with a peak gradient of 40 mmHg.  We will recheck this in September of this year.

## 2020-10-27 NOTE — Progress Notes (Signed)
10/27/2020 FISHER HARGADON   [birth year]  [id number]  Primary Physician [name], MD Primary Cardiologist: [name] MD FACP, [place], [place], [place]  HPI:  [name] Glass is a 80 y.o.  moderately overweight although fit-appearing divorced Caucasian male with no children who I last saw10/01/2020. He has a history of CAD status post circumflex and RCA stenting by myself 06/09/04.His other cardiovascular problemsinclude hypertension, hyperlipidemia and statin intolerance. He does drink Pinch blended scotch on a daily basis. He denies chest pain or shortness of breath. 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 He had a Myoview stress test performed 07/16/14 for preoperative clearance prior to a right total knee replacement which was performed by Dr. Ricki Rodriguez 08/03/14. This was low risk and nonischemic. Since I saw him last he was down in [place] at the [employer] and developed some throat pain. This led to an extensive workup in the hospital including fairly normal 2-D echo, a Myoview stress test. He ruled out for myocardial infarction. He scheduled to have cosmetic surgery on his eyelids and eyebrow 09/27/17 when she is cleared for given his recent negative stress test. He also recently got engaged and and married this past June. He went on a 15-day cruise to the [place].  He was in [employer] and had an episode of witnessed syncope. He ultimately was transported by EMS to [facility] where he underwent cardiac catheterization by Dr. Earlie Counts radially revealing what he describes as patent stents in his circumflex and RCA which I implanted back in 2006 with high-grade calcified LAD stenosis. He underwent staged atherectomy and drug-eluting stent implantation. He also was noted to have long pauses on telemetry with recurrent syncope and and had a temporary transvenous  pacemaker placed followed by permanent pacemaker implantation.   He did transition from Brilinta to Plavix because of dyspnea. He was hospitalized in May 2020, for sepsis. He tested negative for COVID-19. He did have a transesophageal echo that did not show evidence of SBE. He developed a pocket infection and ended up having his pacemaker generator and leads were placed by Dr. Curt Bears 10/30/2018. He has developed significant hematuria and has stopped his Plavix.  Since I saw him 6 months ago he was diagnosed with "COPD" and is on inhaled bronchodilators.  He has chronic dyspnea but denies chest pain.  His last 2D echo performed 02/23/2020 did reveal a ascending thoracic aortic dimension of 45 mm and moderate aortic stenosis with a peak gradient of 40 mmHg and normal LV systolic function.   Current Meds  Medication Sig  . acyclovir (ZOVIRAX) 200 MG capsule TAKE 1 CAPSULE BY MOUTH THREE TIMES A DAY  . anastrozole (ARIMIDEX) 1 MG tablet TAKE 1 TABLET (1 MG TOTAL) BY MOUTH 2 (TWO) TIMES A WEEK.  [name] ARMOUR THYROID 60 MG tablet TAKE 2 TABLETS BY MOUTH EVERY DAY  . aspirin EC 81 MG tablet Take 1 tablet (81 mg total) by mouth daily.  [name] ezetimibe (ZETIA) 10 MG tablet TAKE 1 TABLET BY MOUTH EVERY DAY  . finasteride (PROSCAR) 5 MG tablet TAKE 1 TABLET BY MOUTH EVERY DAY  . fluticasone (FLONASE) 50 MCG/ACT nasal spray SPRAY 2 SPRAYS INTO EACH NOSTRIL EVERY DAY  . hydrALAZINE (APRESOLINE) 25 MG tablet TAKE 1 TABLET BY MOUTH THREE TIMES A DAY  . ipratropium (ATROVENT) 0.03 % nasal spray Place 2 sprays into both nostrils 3 (three) times  daily as needed for rhinitis.  [name] levocetirizine (XYZAL) 5 MG tablet Take 1 tablet (5 mg total) by mouth every evening.  [name] liothyronine (CYTOMEL) 25 MCG tablet TAKE 1 TABLET BY MOUTH EVERY DAY IN THE MORNING  . LIVALO 4 MG TABS TAKE 1 TABLET BY MOUTH EVERY DAY  . montelukast (SINGULAIR) 10 MG tablet TAKE 1 TABLET BY MOUTH EVERYDAY AT BEDTIME  . Nandrolone Decanoate 200  MG/ML OIL Inject 100 mg as directed once a week.  . OMNITROPE 5.8 MG SOLR ADD 1.14 ML DILUENT PER VIAL AND INJECT 0.15ML SUBCUTANEOUSLY MONDAY THROUGH FRIDAY AT BEDTIME. REFRIGERATE DO NOT FREEZE.  [name] Potassium Gluconate 2.5 MEQ TABS Take by mouth.  . SYRINGE-NEEDLE, DISP, 3 ML 23G X 1" 3 ML MISC Use to inject testosterone as prescribed.  . tamsulosin (FLOMAX) 0.4 MG CAPS capsule TAKE 1 CAPSULE BY MOUTH EVERY DAY IN THE MORNING  . testosterone cypionate (DEPOTESTOSTERONE CYPIONATE) 200 MG/ML injection Inject 1.25 mls (250 mg total) into the muscle every 7 (seven) days.  [name] umeclidinium-vilanterol (ANORO ELLIPTA) 62.5-25 MCG/INH AEPB Inhale 1 puff into the lungs daily.  . [DISCONTINUED] methylPREDNISolone (MEDROL DOSEPAK) 4 MG TBPK tablet TAKE AS DIRECTED     Allergies  Allergen Reactions  . Morphine And Related     Had "crazy dreams" felt "crazy" per pt.  . Bactrim [Sulfamethoxazole-Trimethoprim]   . Statins   . Sulfamethoxazole   . Trimethoprim     Social History   Socioeconomic History  . Marital status: Divorced    Spouse name: Not on file  . Number of children: 0  . Years of education: Not on file  . Highest education level: Not on file  Occupational History  . Occupation: Retired   Tobacco Use  . Smoking status: Never Smoker  . Smokeless tobacco: Never Used  Vaping Use  . Vaping Use: Never used  Substance and Sexual Activity  . Alcohol use: Yes    Alcohol/week: 4.0 standard drinks    Types: 4 Standard drinks or equivalent per week    Comment: 3 drinks at night   . Drug use: No  . Sexual activity: Not on file  Other Topics Concern  . Not on file  Social History Narrative  . Not on file   Social Determinants of Health   Financial Resource Strain: Low Risk   . Difficulty of Paying Living Expenses: Not hard at all  Food Insecurity: No Food Insecurity  . Worried About Charity fundraiser in the Last Year: Never true  . Ran Out of Food in the Last Year: Never true   Transportation Needs: No Transportation Needs  . Lack of Transportation (Medical): No  . Lack of Transportation (Non-Medical): No  Physical Activity: Sufficiently Active  . Days of Exercise per Week: 7 days  . Minutes of Exercise per Session: 30 min  Stress: No Stress Concern Present  . Feeling of Stress : Not at all  Social Connections: Socially Integrated  . Frequency of Communication with Friends and Family: More than three times a week  . Frequency of Social Gatherings with Friends and Family: More than three times a week  . Attends Religious Services: More than 4 times per year  . Active Member of Clubs or Organizations: Yes  . Attends Archivist Meetings: More than 4 times per year  . Marital Status: Married  Human resources officer Violence: Not At Risk  . Fear of Current or Ex-Partner: No  . Emotionally Abused: No  .  Physically Abused: No  . Sexually Abused: No     Review of Systems: General: negative for chills, fever, night sweats or weight changes.  Cardiovascular: negative for chest pain, dyspnea on exertion, edema, orthopnea, palpitations, paroxysmal nocturnal dyspnea or shortness of breath Dermatological: negative for rash Respiratory: negative for cough or wheezing Urologic: negative for hematuria Abdominal: negative for nausea, vomiting, diarrhea, bright red blood per rectum, melena, or hematemesis Neurologic: negative for visual changes, syncope, or dizziness All other systems reviewed and are otherwise negative except as noted above.    Blood pressure 140/70, pulse 60, height 6' (1.829 m), weight 209 lb 3.2 oz (94.9 kg), SpO2 94 %.  General appearance: alert and no distress Neck: no adenopathy, no JVD, supple, symmetrical, trachea midline, thyroid not enlarged, symmetric, no tenderness/mass/nodules and Soft carotid bruits versus transmitted murmur Lungs: clear to auscultation bilaterally Heart: 2/6 outflow tract murmur consistent with aortic  stenosis. Extremities: extremities normal, atraumatic, no cyanosis or edema Pulses: 2+ and symmetric Skin: Skin color, texture, turgor normal. No rashes or lesions Neurologic: Alert and oriented X 3, normal strength and tone. Normal symmetric reflexes. Normal coordination and gait  EKG atrially paced rhythm at 60 with inferior Q waves.  I personally reviewed this EKG.  ASSESSMENT AND PLAN:   Coronary artery disease History of CAD status post RCA and circumflex intervention by myself 06/09/2004.  He was in [facility] and had a witnessed syncope.  He underwent cardiac catheterization by Dr. Earlie Counts radially revealing what he describes as patent stents in circumflex and RCA which I implanted in 06 with high-grade calcified LAD stenosis.  He underwent staged atherectomy and DES.  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  Essential hypertension, benign History of essential hypertension a blood pressure measured today 140/70.  He is on hydralazine.  Hyperlipidemia with target LDL less than 70 Hyperlipidemia on high-dose Livalo and Zinamide with lipid profile performed 12/22/2019 revealing total cholesterol 154, LDL of 80 and HDL 46.  Thoracic aortic aneurysm (HCC) Small ascending thoracic aortic aneurysm measuring 45 mm by 2D echo performed 02/23/2020.  This will be repeated in September.  Moderate aortic stenosis History of moderate aortic stenosis with 2D echo performed 02/23/2020 revealing an aortic valve area of 2.83 cm with a peak gradient of 40 mmHg.  We will recheck this in September of this year.  Sinus node dysfunction (HCC) History of permanent transvenous pacemaker placed in [facility].  He did have his leads explanted because of infection by Dr. Curt Bears 10/30/2018 and these were replaced.  Dr. Caryl Comes follows his pacemaker.      [name] MD FACP,FACC,FAHA,  [facility] 10/27/2020 4:05 PM

## 2020-10-27 NOTE — Assessment & Plan Note (Signed)
History of permanent transvenous pacemaker placed in [facility].  He did have his leads explanted because of infection by Dr. Curt Bears 10/30/2018 and these were replaced.  Dr. Caryl Comes follows his pacemaker.

## 2020-10-27 NOTE — Assessment & Plan Note (Signed)
Small ascending thoracic aortic aneurysm measuring 45 mm by 2D echo performed 02/23/2020.  This will be repeated in September.

## 2020-10-28 LAB — CUP PACEART REMOTE DEVICE CHECK
Battery Remaining Longevity: 151 mo
Battery Voltage: 3.03 V
Brady Statistic AP VP Percent: 0.31 %
Brady Statistic AP VS Percent: 45.08 %
Brady Statistic AS VP Percent: 0.1 %
Brady Statistic AS VS Percent: 54.51 %
Brady Statistic RA Percent Paced: 45.57 %
Brady Statistic RV Percent Paced: 0.41 %
Date Time Interrogation Session: 20220525113519
Implantable Lead Implant Date: 20200527
Implantable Lead Implant Date: 20200527
Implantable Lead Location: 753859
Implantable Lead Location: 753860
Implantable Lead Model: 5076
Implantable Lead Model: 5076
Implantable Pulse Generator Implant Date: 20200527
Lead Channel Impedance Value: 342 Ohm
Lead Channel Impedance Value: 399 Ohm
Lead Channel Impedance Value: 399 Ohm
Lead Channel Impedance Value: 627 Ohm
Lead Channel Pacing Threshold Amplitude: 0.5 V
Lead Channel Pacing Threshold Amplitude: 0.625 V
Lead Channel Pacing Threshold Pulse Width: 0.4 ms
Lead Channel Pacing Threshold Pulse Width: 0.4 ms
Lead Channel Sensing Intrinsic Amplitude: 10 mV
Lead Channel Sensing Intrinsic Amplitude: 10 mV
Lead Channel Sensing Intrinsic Amplitude: 4.875 mV
Lead Channel Sensing Intrinsic Amplitude: 4.875 mV
Lead Channel Setting Pacing Amplitude: 1.5 V
Lead Channel Setting Pacing Amplitude: 2.5 V
Lead Channel Setting Pacing Pulse Width: 0.4 ms
Lead Channel Setting Sensing Sensitivity: 2 mV

## 2020-10-29 ENCOUNTER — Other Ambulatory Visit: Payer: Self-pay | Admitting: Internal Medicine

## 2020-11-02 ENCOUNTER — Telehealth: Payer: Self-pay | Admitting: Cardiovascular Disease

## 2020-11-02 DIAGNOSIS — R221 Localized swelling, mass and lump, neck: Secondary | ICD-10-CM | POA: Diagnosis not present

## 2020-11-02 DIAGNOSIS — I2510 Atherosclerotic heart disease of native coronary artery without angina pectoris: Secondary | ICD-10-CM | POA: Diagnosis not present

## 2020-11-02 NOTE — Telephone Encounter (Signed)
DR.Dundha is requesting a EKG that was done on the pt last week 10/27/20 faxed to him at [phone].

## 2020-11-02 NOTE — Telephone Encounter (Signed)
10/27/20 EKG faxed to [facility] at fax # [phone].

## 2020-11-04 ENCOUNTER — Other Ambulatory Visit: Payer: Self-pay | Admitting: Internal Medicine

## 2020-11-04 DIAGNOSIS — E291 Testicular hypofunction: Secondary | ICD-10-CM

## 2020-11-04 DIAGNOSIS — E039 Hypothyroidism, unspecified: Secondary | ICD-10-CM

## 2020-11-05 ENCOUNTER — Other Ambulatory Visit: Payer: Self-pay | Admitting: Internal Medicine

## 2020-11-05 DIAGNOSIS — H6981 Other specified disorders of Eustachian tube, right ear: Secondary | ICD-10-CM

## 2020-11-05 DIAGNOSIS — H6504 Acute serous otitis media, recurrent, right ear: Secondary | ICD-10-CM

## 2020-11-12 DIAGNOSIS — Z20822 Contact with and (suspected) exposure to covid-19: Secondary | ICD-10-CM | POA: Diagnosis not present

## 2020-11-12 DIAGNOSIS — Z03818 Encounter for observation for suspected exposure to other biological agents ruled out: Secondary | ICD-10-CM | POA: Diagnosis not present

## 2020-11-12 DIAGNOSIS — J00 Acute nasopharyngitis [common cold]: Secondary | ICD-10-CM | POA: Diagnosis not present

## 2020-11-15 DIAGNOSIS — K5900 Constipation, unspecified: Secondary | ICD-10-CM | POA: Diagnosis not present

## 2020-11-15 DIAGNOSIS — R221 Localized swelling, mass and lump, neck: Secondary | ICD-10-CM | POA: Diagnosis not present

## 2020-11-15 DIAGNOSIS — L989 Disorder of the skin and subcutaneous tissue, unspecified: Secondary | ICD-10-CM | POA: Diagnosis not present

## 2020-11-15 DIAGNOSIS — J392 Other diseases of pharynx: Secondary | ICD-10-CM | POA: Diagnosis not present

## 2020-11-15 DIAGNOSIS — Z452 Encounter for adjustment and management of vascular access device: Secondary | ICD-10-CM | POA: Diagnosis not present

## 2020-11-15 DIAGNOSIS — Z4682 Encounter for fitting and adjustment of non-vascular catheter: Secondary | ICD-10-CM | POA: Diagnosis not present

## 2020-11-16 DIAGNOSIS — M436 Torticollis: Secondary | ICD-10-CM | POA: Diagnosis present

## 2020-11-16 DIAGNOSIS — R0989 Other specified symptoms and signs involving the circulatory and respiratory systems: Secondary | ICD-10-CM | POA: Diagnosis not present

## 2020-11-16 DIAGNOSIS — I2510 Atherosclerotic heart disease of native coronary artery without angina pectoris: Secondary | ICD-10-CM | POA: Diagnosis present

## 2020-11-16 DIAGNOSIS — Z882 Allergy status to sulfonamides status: Secondary | ICD-10-CM | POA: Diagnosis not present

## 2020-11-16 DIAGNOSIS — R1312 Dysphagia, oropharyngeal phase: Secondary | ICD-10-CM | POA: Diagnosis not present

## 2020-11-16 DIAGNOSIS — J449 Chronic obstructive pulmonary disease, unspecified: Secondary | ICD-10-CM | POA: Diagnosis present

## 2020-11-16 DIAGNOSIS — Z7982 Long term (current) use of aspirin: Secondary | ICD-10-CM | POA: Diagnosis not present

## 2020-11-16 DIAGNOSIS — Z888 Allergy status to other drugs, medicaments and biological substances status: Secondary | ICD-10-CM | POA: Diagnosis not present

## 2020-11-16 DIAGNOSIS — Z79811 Long term (current) use of aromatase inhibitors: Secondary | ICD-10-CM | POA: Diagnosis not present

## 2020-11-16 DIAGNOSIS — T17908D Unspecified foreign body in respiratory tract, part unspecified causing other injury, subsequent encounter: Secondary | ICD-10-CM | POA: Diagnosis not present

## 2020-11-16 DIAGNOSIS — K91870 Postprocedural hematoma of a digestive system organ or structure following a digestive system procedure: Secondary | ICD-10-CM | POA: Diagnosis not present

## 2020-11-16 DIAGNOSIS — J811 Chronic pulmonary edema: Secondary | ICD-10-CM | POA: Diagnosis not present

## 2020-11-16 DIAGNOSIS — R918 Other nonspecific abnormal finding of lung field: Secondary | ICD-10-CM | POA: Diagnosis not present

## 2020-11-16 DIAGNOSIS — Z885 Allergy status to narcotic agent status: Secondary | ICD-10-CM | POA: Diagnosis not present

## 2020-11-16 DIAGNOSIS — Z951 Presence of aortocoronary bypass graft: Secondary | ICD-10-CM | POA: Diagnosis not present

## 2020-11-16 DIAGNOSIS — K91871 Postprocedural hematoma of a digestive system organ or structure following other procedure: Secondary | ICD-10-CM | POA: Diagnosis not present

## 2020-11-16 DIAGNOSIS — R490 Dysphonia: Secondary | ICD-10-CM | POA: Diagnosis not present

## 2020-11-16 DIAGNOSIS — I1310 Hypertensive heart and chronic kidney disease without heart failure, with stage 1 through stage 4 chronic kidney disease, or unspecified chronic kidney disease: Secondary | ICD-10-CM | POA: Diagnosis present

## 2020-11-16 DIAGNOSIS — T17408A Unspecified foreign body in trachea causing other injury, initial encounter: Secondary | ICD-10-CM | POA: Diagnosis not present

## 2020-11-16 DIAGNOSIS — R1310 Dysphagia, unspecified: Secondary | ICD-10-CM | POA: Diagnosis not present

## 2020-11-16 DIAGNOSIS — N1831 Chronic kidney disease, stage 3a: Secondary | ICD-10-CM | POA: Diagnosis present

## 2020-11-16 DIAGNOSIS — S100XXA Contusion of throat, initial encounter: Secondary | ICD-10-CM | POA: Diagnosis not present

## 2020-11-16 DIAGNOSIS — J387 Other diseases of larynx: Secondary | ICD-10-CM | POA: Diagnosis present

## 2020-11-16 DIAGNOSIS — R221 Localized swelling, mass and lump, neck: Secondary | ICD-10-CM | POA: Diagnosis present

## 2020-11-16 DIAGNOSIS — Z79899 Other long term (current) drug therapy: Secondary | ICD-10-CM | POA: Diagnosis not present

## 2020-11-18 NOTE — Progress Notes (Signed)
Remote pacemaker transmission.   

## 2020-11-20 ENCOUNTER — Other Ambulatory Visit: Payer: Self-pay | Admitting: Internal Medicine

## 2020-11-20 DIAGNOSIS — J4540 Moderate persistent asthma, uncomplicated: Secondary | ICD-10-CM

## 2020-12-18 IMAGING — MG DIGITAL DIAGNOSTIC BILAT W/ TOMO W/ CAD
8 of 14 series · 8 of 40 positions shown · non-contrast
Comparison: None.

CLINICAL DATA: 79-year-old male presenting with a new palpable area
of concern in the left breast.

EXAM:
DIGITAL DIAGNOSTIC BILATERAL MAMMOGRAM WITH CAD AND TOMO
ULTRASOUND BILATERAL BREAST

[R CC synth-2D (1 of 2)]
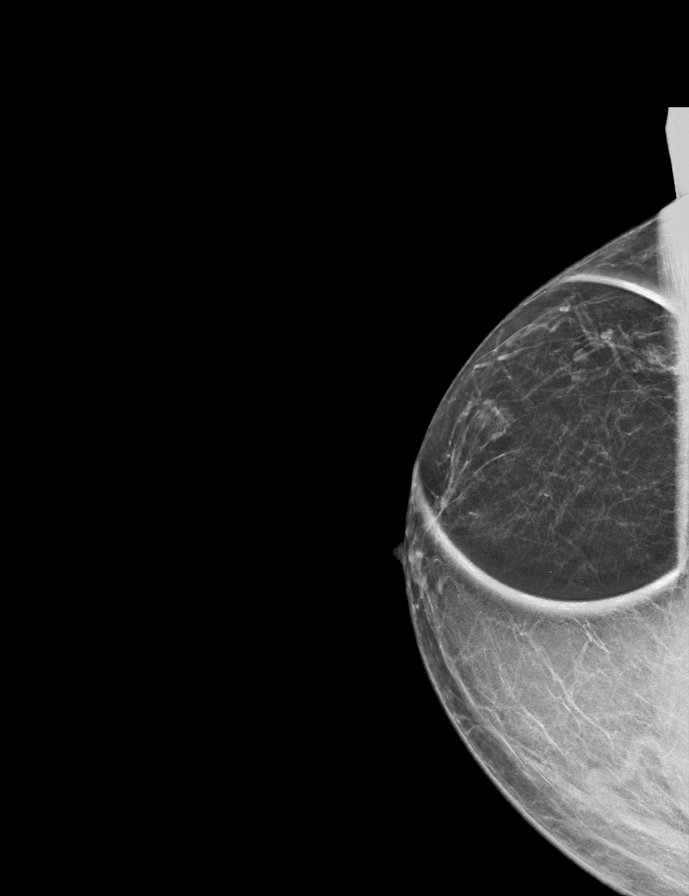

[L CC synth-2D (1 of 2)]
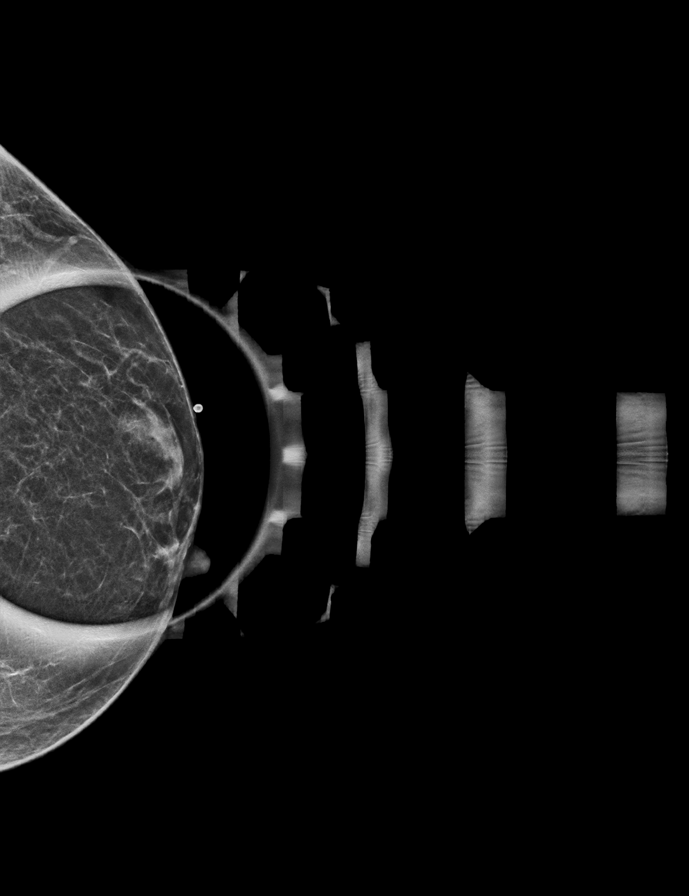

[R MLO synth-2D (1 of 2)]
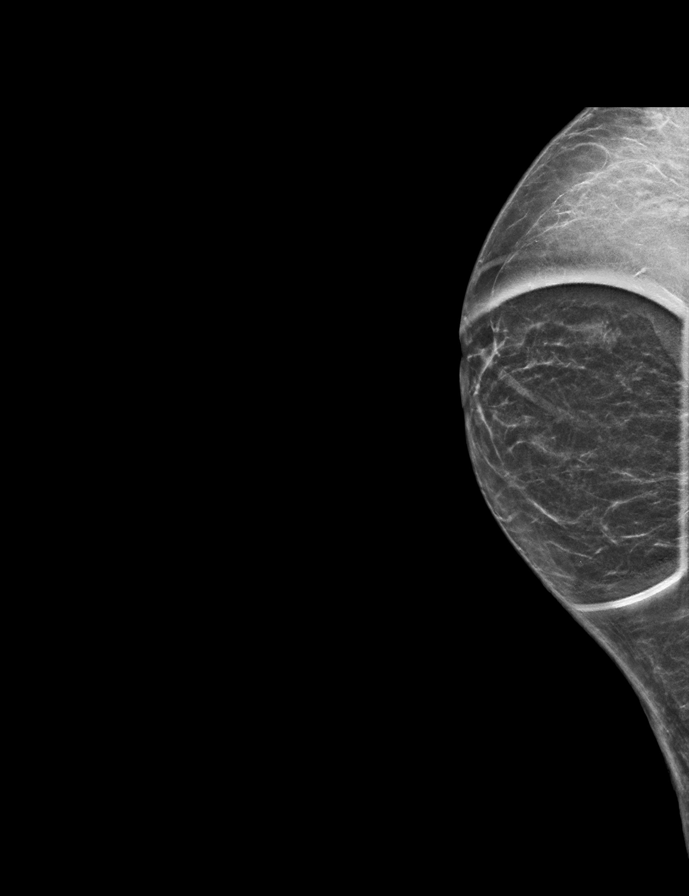

[L MLO synth-2D]
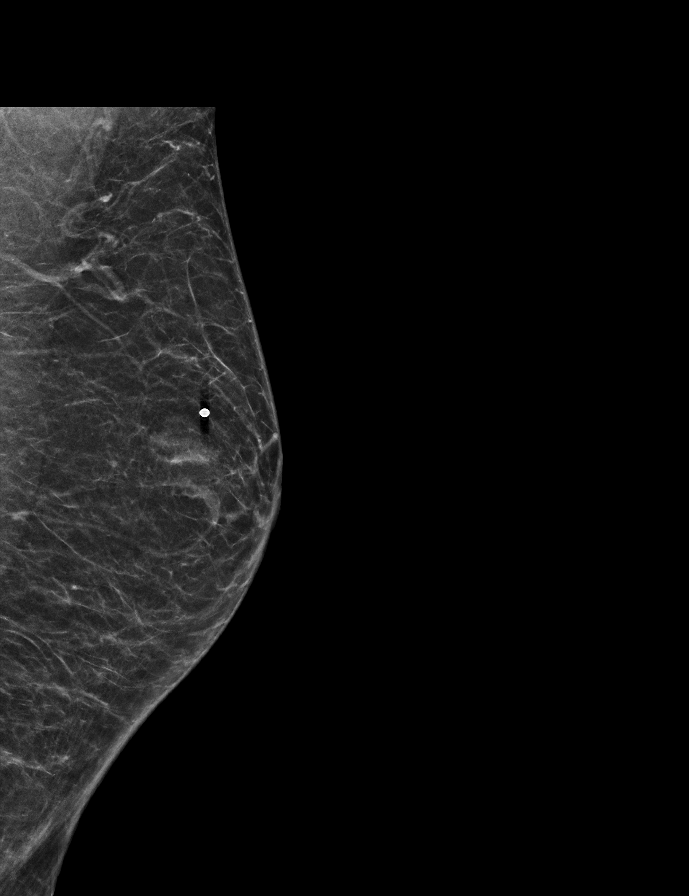

[R MLO synth-2D (2 of 2)]
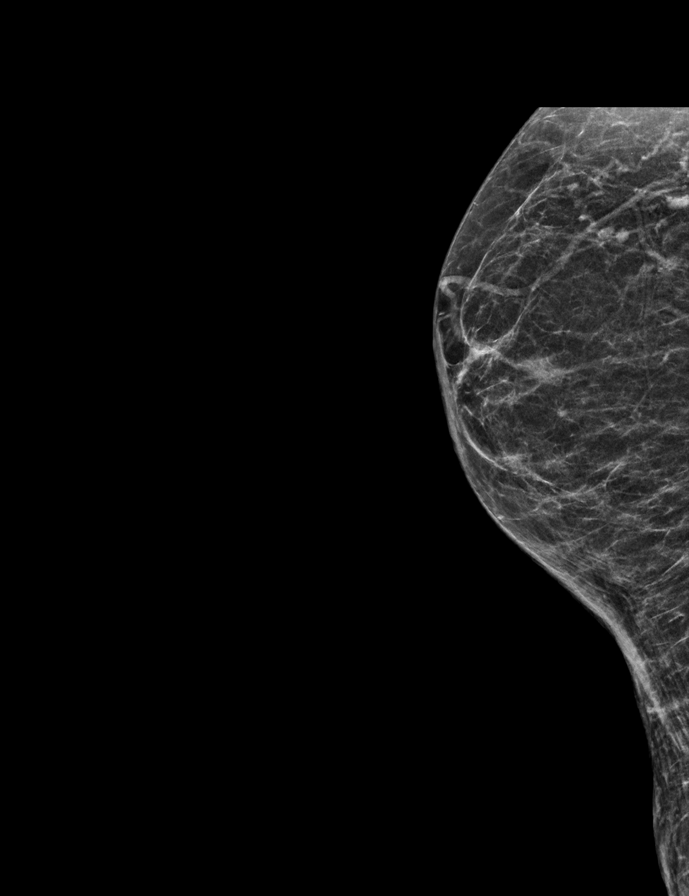

[R CC synth-2D (2 of 2)]
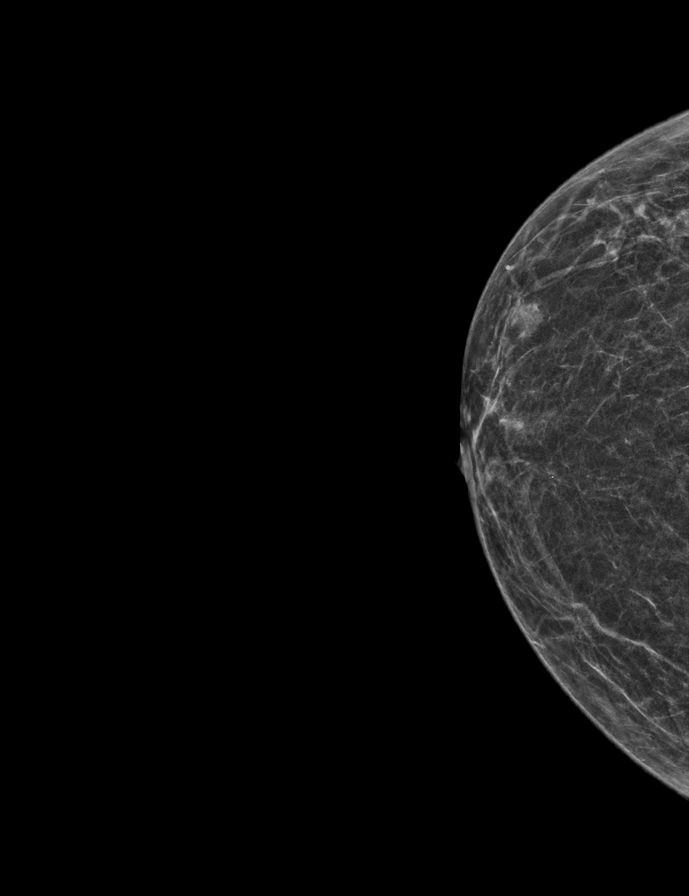

[L CC synth-2D (2 of 2)]
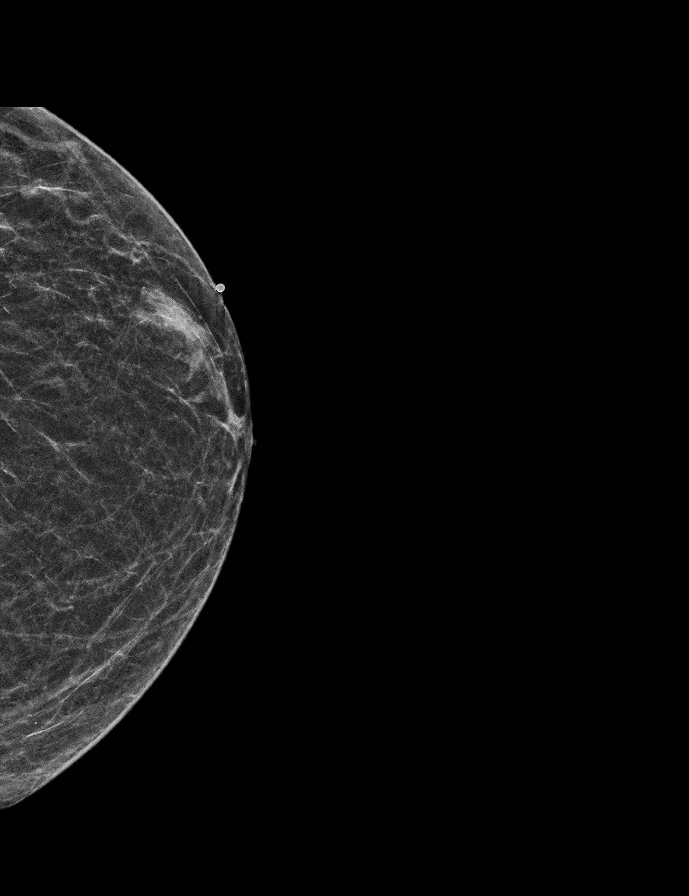

[R CC tomo · tomo slice 21/40.0]
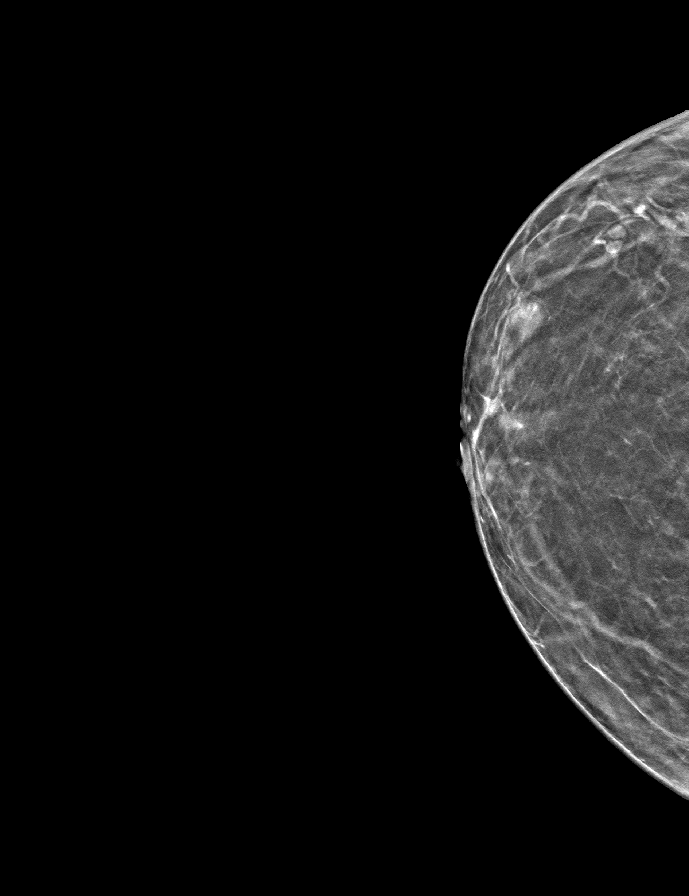

[8 of 40 positions shown; findings below may reference images not displayed]

ACR Breast Density Category b: There are scattered areas of
fibroglandular density.
FINDINGS: Mammogram:

Right breast: Spot compression tomosynthesis views were performed in
addition to standard views for an asymmetry in the outer right
breast. On the additional views the focal asymmetry is less
conspicuous and most likely represents fibroglandular tissue.

Left breast: A skin BB marks the palpable site of concern in the
upper outer left breast anterior depth. Spot compression
tomosynthesis views were performed in addition to standard views.
There is an asymmetry at the palpable site of concern which is less
conspicuous on the spot imaging.

Mammographic images were processed with CAD.

On physical exam of the left breast at the palpable site of concern
I feel a small area of tissue without a fixed discrete mass.
Similarly on physical exam of the right breast there is no fixed
discrete mass.

Ultrasound:

Targeted ultrasound is performed in the right breast at 9 o'clock 2
cm from the nipple demonstrating a small island of tissue measuring
0.7 x 0.6 x 0.8 cm which likely corresponds to the asymmetry seen
mammographically. There is no internal blood flow. Targeted
ultrasound the of the right breast at 10 o'clock 5 cm from the
nipple demonstrates a small benign intramammary lymph node with
cortical thickness of 0.1 cm. Targeted ultrasound of the right
axilla demonstrates normal-appearing lymph nodes.

Targeted ultrasound is performed in the left breast at 1 o'clock 2
cm from nipple at the palpable area of concern demonstrating normal
fibroglandular tissue without a suspicious cystic or solid mass.
IMPRESSION: 1. Right breast focal asymmetry without a sonographic correlate is
probably benign.

2. At the palpable site of concern there is a focal asymmetry which
is probably benign and most likely represents normal fibroglandular
tissue.

RECOMMENDATION:
Diagnostic bilateral mammogram and possible ultrasound in 6 months.
The patient was counseled to continue physical examination and to
return sooner should the lump in his left breast enlarge.

I have discussed the findings and recommendations with the patient.
If applicable, a reminder letter will be sent to the patient
regarding the next appointment.

BI-RADS CATEGORY  3: Probably benign.

## 2020-12-20 ENCOUNTER — Other Ambulatory Visit: Payer: Self-pay | Admitting: Internal Medicine

## 2020-12-22 ENCOUNTER — Other Ambulatory Visit: Payer: Self-pay | Admitting: Internal Medicine

## 2020-12-30 ENCOUNTER — Other Ambulatory Visit: Payer: Self-pay | Admitting: Internal Medicine

## 2021-01-04 ENCOUNTER — Other Ambulatory Visit: Payer: Self-pay | Admitting: Internal Medicine

## 2021-01-04 DIAGNOSIS — E785 Hyperlipidemia, unspecified: Secondary | ICD-10-CM

## 2021-01-04 DIAGNOSIS — I2510 Atherosclerotic heart disease of native coronary artery without angina pectoris: Secondary | ICD-10-CM

## 2021-01-22 ENCOUNTER — Other Ambulatory Visit: Payer: Self-pay | Admitting: Internal Medicine

## 2021-01-26 ENCOUNTER — Ambulatory Visit (INDEPENDENT_AMBULATORY_CARE_PROVIDER_SITE_OTHER): Payer: Medicare Other

## 2021-01-26 DIAGNOSIS — I495 Sick sinus syndrome: Secondary | ICD-10-CM | POA: Diagnosis not present

## 2021-01-28 LAB — CUP PACEART REMOTE DEVICE CHECK
Battery Remaining Longevity: 148 mo
Battery Voltage: 3.03 V
Brady Statistic AP VP Percent: 0.04 %
Brady Statistic AP VS Percent: 34.01 %
Brady Statistic AS VP Percent: 0.06 %
Brady Statistic AS VS Percent: 65.89 %
Brady Statistic RA Percent Paced: 34.21 %
Brady Statistic RV Percent Paced: 0.1 %
Date Time Interrogation Session: 20220826100549
Implantable Lead Implant Date: 20200527
Implantable Lead Implant Date: 20200527
Implantable Lead Location: 753859
Implantable Lead Location: 753860
Implantable Lead Model: 5076
Implantable Lead Model: 5076
Implantable Pulse Generator Implant Date: 20200527
Lead Channel Impedance Value: 342 Ohm
Lead Channel Impedance Value: 399 Ohm
Lead Channel Impedance Value: 399 Ohm
Lead Channel Impedance Value: 589 Ohm
Lead Channel Pacing Threshold Amplitude: 0.5 V
Lead Channel Pacing Threshold Amplitude: 0.625 V
Lead Channel Pacing Threshold Pulse Width: 0.4 ms
Lead Channel Pacing Threshold Pulse Width: 0.4 ms
Lead Channel Sensing Intrinsic Amplitude: 3.875 mV
Lead Channel Sensing Intrinsic Amplitude: 3.875 mV
Lead Channel Sensing Intrinsic Amplitude: 7.625 mV
Lead Channel Sensing Intrinsic Amplitude: 7.625 mV
Lead Channel Setting Pacing Amplitude: 1.5 V
Lead Channel Setting Pacing Amplitude: 2.5 V
Lead Channel Setting Pacing Pulse Width: 0.4 ms
Lead Channel Setting Sensing Sensitivity: 2 mV

## 2021-02-10 NOTE — Progress Notes (Signed)
Remote pacemaker transmission.   

## 2021-02-16 ENCOUNTER — Ambulatory Visit (HOSPITAL_COMMUNITY): Payer: Medicare Other | Attending: Cardiovascular Disease

## 2021-02-16 ENCOUNTER — Other Ambulatory Visit: Payer: Self-pay

## 2021-02-16 ENCOUNTER — Encounter (HOSPITAL_COMMUNITY): Payer: Self-pay

## 2021-02-16 DIAGNOSIS — I350 Nonrheumatic aortic (valve) stenosis: Secondary | ICD-10-CM | POA: Diagnosis not present

## 2021-02-16 DIAGNOSIS — I10 Essential (primary) hypertension: Secondary | ICD-10-CM | POA: Diagnosis not present

## 2021-02-16 DIAGNOSIS — E785 Hyperlipidemia, unspecified: Secondary | ICD-10-CM | POA: Diagnosis not present

## 2021-02-16 LAB — ECHOCARDIOGRAM COMPLETE
AR max vel: 2.1 cm2
AV Area VTI: 2 cm2
AV Area mean vel: 2.13 cm2
AV Mean grad: 20 mmHg
AV Peak grad: 38.2 mmHg
Ao pk vel: 3.09 m/s
Area-P 1/2: 3.65 cm2
P 1/2 time: 658 msec
S' Lateral: 3.1 cm

## 2021-02-16 NOTE — Progress Notes (Signed)
Verified appointment "no show" status with Princess Bruins at 11:31.

## 2021-02-17 ENCOUNTER — Other Ambulatory Visit: Payer: Self-pay | Admitting: Internal Medicine

## 2021-02-17 DIAGNOSIS — E039 Hypothyroidism, unspecified: Secondary | ICD-10-CM

## 2021-02-18 ENCOUNTER — Other Ambulatory Visit: Payer: Self-pay

## 2021-02-18 DIAGNOSIS — I350 Nonrheumatic aortic (valve) stenosis: Secondary | ICD-10-CM

## 2021-02-18 NOTE — Progress Notes (Signed)
o

## 2021-03-10 ENCOUNTER — Other Ambulatory Visit: Payer: Self-pay | Admitting: Internal Medicine

## 2021-03-10 DIAGNOSIS — E291 Testicular hypofunction: Secondary | ICD-10-CM

## 2021-03-14 ENCOUNTER — Telehealth: Payer: Self-pay | Admitting: Internal Medicine

## 2021-03-14 NOTE — Telephone Encounter (Signed)
Pt is requesting a new order for Simethicone. Pt takes OTC Simethicone tablet, but is requesting liquid form. PT states tablet gets stuck in his esophagus.  PT is requesting Simethicone 0.5mg 

## 2021-03-15 ENCOUNTER — Other Ambulatory Visit: Payer: Self-pay | Admitting: Internal Medicine

## 2021-03-15 DIAGNOSIS — K219 Gastro-esophageal reflux disease without esophagitis: Secondary | ICD-10-CM

## 2021-03-15 MED ORDER — SIMETHICONE 40 MG/0.6ML PO LIQD: 40.0000 mg | Freq: Four times a day (QID) | ORAL | 0 refills | Status: DC

## 2021-03-16 ENCOUNTER — Other Ambulatory Visit: Payer: Self-pay | Admitting: Internal Medicine

## 2021-03-17 ENCOUNTER — Other Ambulatory Visit: Payer: Self-pay | Admitting: Internal Medicine

## 2021-03-22 DIAGNOSIS — M19021 Primary osteoarthritis, right elbow: Secondary | ICD-10-CM | POA: Diagnosis not present

## 2021-03-24 ENCOUNTER — Other Ambulatory Visit: Payer: Self-pay | Admitting: Pulmonary Disease

## 2021-04-06 ENCOUNTER — Other Ambulatory Visit: Payer: Self-pay | Admitting: Internal Medicine

## 2021-04-06 DIAGNOSIS — J22 Unspecified acute lower respiratory infection: Secondary | ICD-10-CM | POA: Insufficient documentation

## 2021-04-06 MED ORDER — AMOXICILLIN-POT CLAVULANATE 875-125 MG PO TABS: 1.0000 | ORAL_TABLET | Freq: Two times a day (BID) | ORAL | 0 refills | Status: DC

## 2021-04-06 MED ORDER — CEFDINIR 300 MG PO CAPS: 300.0000 mg | ORAL_CAPSULE | Freq: Two times a day (BID) | ORAL | 0 refills | Status: AC

## 2021-04-06 NOTE — Progress Notes (Unsigned)
Lrti

## 2021-04-16 ENCOUNTER — Other Ambulatory Visit: Payer: Self-pay | Admitting: Internal Medicine

## 2021-04-16 DIAGNOSIS — E291 Testicular hypofunction: Secondary | ICD-10-CM

## 2021-04-25 ENCOUNTER — Ambulatory Visit (INDEPENDENT_AMBULATORY_CARE_PROVIDER_SITE_OTHER): Payer: Medicare Other

## 2021-04-25 DIAGNOSIS — Z0000 Encounter for general adult medical examination without abnormal findings: Secondary | ICD-10-CM | POA: Diagnosis not present

## 2021-04-25 NOTE — Patient Instructions (Signed)
[name] , Thank you for taking time to come for your Medicare Wellness Visit. I appreciate your ongoing commitment to your health goals. Please review the following plan we discussed and let me know if I can assist you in the future.   Screening recommendations/referrals: Colonoscopy: no longer required  Recommended yearly ophthalmology/optometry visit for glaucoma screening and checkup Recommended yearly dental visit for hygiene and checkup  Vaccinations: Influenza vaccine: completed  Pneumococcal vaccine: completed  Tdap vaccine: completed 06/26/2017 Shingles vaccine: completed     Advanced directives: will provide copies   Conditions/risks identified: none   Next appointment: none   Preventive Care 81 Years and Older, Male Preventive care refers to lifestyle choices and visits with your health care provider that can promote health and wellness. What does preventive care include? A yearly physical exam. This is also called an annual well check. Dental exams once or twice a year. Routine eye exams. Ask your health care provider how often you should have your eyes checked. Personal lifestyle choices, including: Daily care of your teeth and gums. Regular physical activity. Eating a healthy diet. Avoiding tobacco and drug use. Limiting alcohol use. Practicing safe sex. Taking low doses of aspirin every day. Taking vitamin and mineral supplements as recommended by your health care provider. What happens during an annual well check? The services and screenings done by your health care provider during your annual well check will depend on your age, overall health, lifestyle risk factors, and family history of disease. Counseling  Your health care provider may ask you questions about your: Alcohol use. Tobacco use. Drug use. Emotional well-being. Home and relationship well-being. Sexual activity. Eating habits. History of falls. Memory and ability to understand  (cognition). Work and work Statistician. Screening  You may have the following tests or measurements: Height, weight, and BMI. Blood pressure. Lipid and cholesterol levels. These may be checked every 5 years, or more frequently if you are over 39 years old. Skin check. Lung cancer screening. You may have this screening every year starting at age 42 if you have a 30-pack-year history of smoking and currently smoke or have quit within the past 15 years. Fecal occult blood test (FOBT) of the stool. You may have this test every year starting at age 65. Flexible sigmoidoscopy or colonoscopy. You may have a sigmoidoscopy every 5 years or a colonoscopy every 10 years starting at age 65. Prostate cancer screening. Recommendations will vary depending on your family history and other risks. Hepatitis C blood test. Hepatitis B blood test. Sexually transmitted disease (STD) testing. Diabetes screening. This is done by checking your blood sugar (glucose) after you have not eaten for a while (fasting). You may have this done every 1-3 years. Abdominal aortic aneurysm (AAA) screening. You may need this if you are a current or former smoker. Osteoporosis. You may be screened starting at age 63 if you are at high risk. Talk with your health care provider about your test results, treatment options, and if necessary, the need for more tests. Vaccines  Your health care provider may recommend certain vaccines, such as: Influenza vaccine. This is recommended every year. Tetanus, diphtheria, and acellular pertussis (Tdap, Td) vaccine. You may need a Td booster every 10 years. Zoster vaccine. You may need this after age 55. Pneumococcal 13-valent conjugate (PCV13) vaccine. One dose is recommended after age 76. Pneumococcal polysaccharide (PPSV23) vaccine. One dose is recommended after age 77. Talk to your health care provider about which screenings and vaccines you  need and how often you need them. This  information is not intended to replace advice given to you by your health care provider. Make sure you discuss any questions you have with your health care provider. Document Released: 06/18/2015 Document Revised: 02/09/2016 Document Reviewed: 03/23/2015 Elsevier Interactive Patient Education  2017 [place] Prevention in the Home Falls can cause injuries. They can happen to people of all ages. There are many things you can do to make your home safe and to help prevent falls. What can I do on the outside of my home? Regularly fix the edges of walkways and driveways and fix any cracks. Remove anything that might make you trip as you walk through a door, such as a raised step or threshold. Trim any bushes or trees on the path to your home. Use bright outdoor lighting. Clear any walking paths of anything that might make someone trip, such as rocks or tools. Regularly check to see if handrails are loose or broken. Make sure that both sides of any steps have handrails. Any raised decks and porches should have guardrails on the edges. Have any leaves, snow, or ice cleared regularly. Use sand or salt on walking paths during winter. Clean up any spills in your garage right away. This includes oil or grease spills. What can I do in the bathroom? Use night lights. Install grab bars by the toilet and in the tub and shower. Do not use towel bars as grab bars. Use non-skid mats or decals in the tub or shower. If you need to sit down in the shower, use a plastic, non-slip stool. Keep the floor dry. Clean up any water that spills on the floor as soon as it happens. Remove soap buildup in the tub or shower regularly. Attach bath mats securely with double-sided non-slip rug tape. Do not have throw rugs and other things on the floor that can make you trip. What can I do in the bedroom? Use night lights. Make sure that you have a light by your bed that is easy to reach. Do not use any sheets or  blankets that are too big for your bed. They should not hang down onto the floor. Have a firm chair that has side arms. You can use this for support while you get dressed. Do not have throw rugs and other things on the floor that can make you trip. What can I do in the kitchen? Clean up any spills right away. Avoid walking on wet floors. Keep items that you use a lot in easy-to-reach places. If you need to reach something above you, use a strong step stool that has a grab bar. Keep electrical cords out of the way. Do not use floor polish or wax that makes floors slippery. If you must use wax, use non-skid floor wax. Do not have throw rugs and other things on the floor that can make you trip. What can I do with my stairs? Do not leave any items on the stairs. Make sure that there are handrails on both sides of the stairs and use them. Fix handrails that are broken or loose. Make sure that handrails are as long as the stairways. Check any carpeting to make sure that it is firmly attached to the stairs. Fix any carpet that is loose or worn. Avoid having throw rugs at the top or bottom of the stairs. If you do have throw rugs, attach them to the floor with carpet tape. Make sure that  you have a light switch at the top of the stairs and the bottom of the stairs. If you do not have them, ask someone to add them for you. What else can I do to help prevent falls? Wear shoes that: Do not have high heels. Have rubber bottoms. Are comfortable and fit you well. Are closed at the toe. Do not wear sandals. If you use a stepladder: Make sure that it is fully opened. Do not climb a closed stepladder. Make sure that both sides of the stepladder are locked into place. Ask someone to hold it for you, if possible. Clearly mark and make sure that you can see: Any grab bars or handrails. First and last steps. Where the edge of each step is. Use tools that help you move around (mobility aids) if they are  needed. These include: Canes. Walkers. Scooters. Crutches. Turn on the lights when you go into a dark area. Replace any light bulbs as soon as they burn out. Set up your furniture so you have a clear path. Avoid moving your furniture around. If any of your floors are uneven, fix them. If there are any pets around you, be aware of where they are. Review your medicines with your doctor. Some medicines can make you feel dizzy. This can increase your chance of falling. Ask your doctor what other things that you can do to help prevent falls. This information is not intended to replace advice given to you by your health care provider. Make sure you discuss any questions you have with your health care provider. Document Released: 03/18/2009 Document Revised: 10/28/2015 Document Reviewed: 06/26/2014 Elsevier Interactive Patient Education  2017 [employer].

## 2021-04-25 NOTE — Progress Notes (Signed)
Subjective:   [name] Glass is a 80 y.o. male who presents for an Subsequent Medicare Annual Wellness Visit.  I connected with Candise Che today by telephone and verified that I am speaking with the correct person using two identifiers. Location patient: home Location provider: work Persons participating in the virtual visit: patient, provider.   I discussed the limitations, risks, security and privacy concerns of performing an evaluation and management service by telephone and the availability of in person appointments. I also discussed with the patient that there may be a patient responsible charge related to this service. The patient expressed understanding and verbally consented to this telephonic visit.    Interactive audio and video telecommunications were attempted between this provider and patient, however failed, due to patient having technical difficulties OR patient did not have access to video capability.  We continued and completed visit with audio only.    Review of Systems     Cardiac Risk Factors include: advanced age (>72men, >84 women);dyslipidemia;male gender;hypertension     Objective:    Today's Vitals   There is no height or weight on file to calculate BMI.  Advanced Directives 04/25/2021 02/10/2020 07/05/2019 10/30/2018 10/25/2018 10/22/2018 08/19/2014  Does Patient Have a Medical Advance Directive? Yes Yes Yes - Yes No Yes  Type of Paramedic of [place];Living will Living will;Healthcare Power of [place];Living will [place];Living will [place];Living will - [place];Living will  Does patient want to make changes to medical advance directive? - No - Patient declined No - Patient declined - - - -  Copy of [place] in Chart? No - copy requested No - copy requested No - copy requested No - copy requested No - copy requested - -  Would  patient like information on creating a medical advance directive? - - - No - Patient declined - No - Patient declined -  Pre-existing out of facility DNR order (yellow form or pink MOST form) - - - - - - -    Current Medications (verified) Outpatient Encounter Medications as of 04/25/2021  Medication Sig   acyclovir (ZOVIRAX) 200 MG capsule TAKE 1 CAPSULE BY MOUTH THREE TIMES A DAY   anastrozole (ARIMIDEX) 1 MG tablet TAKE 1 TABLET (1 MG TOTAL) BY MOUTH 2 (TWO) TIMES A WEEK.   ANORO ELLIPTA 62.5-25 MCG/ACT AEPB TAKE 1 PUFF BY MOUTH EVERY DAY   ARMOUR THYROID 60 MG tablet TAKE 2 TABLETS BY MOUTH EVERY DAY   ezetimibe (ZETIA) 10 MG tablet TAKE 1 TABLET BY MOUTH EVERY DAY   finasteride (PROSCAR) 5 MG tablet TAKE 1 TABLET BY MOUTH EVERY DAY   fluticasone (FLONASE) 50 MCG/ACT nasal spray SPRAY 2 SPRAYS INTO EACH NOSTRIL EVERY DAY   hydrALAZINE (APRESOLINE) 25 MG tablet TAKE 1 TABLET BY MOUTH THREE TIMES A DAY   ipratropium (ATROVENT) 0.03 % nasal spray Place 2 sprays into both nostrils 3 (three) times daily as needed for rhinitis.   levocetirizine (XYZAL) 5 MG tablet TAKE 1 TABLET BY MOUTH EVERY DAY IN THE EVENING   liothyronine (CYTOMEL) 25 MCG tablet TAKE 1 TABLET BY MOUTH EVERY DAY IN THE MORNING   LIVALO 4 MG TABS TAKE 1 TABLET BY MOUTH EVERY DAY   montelukast (SINGULAIR) 10 MG tablet TAKE 1 TABLET BY MOUTH EVERYDAY AT BEDTIME   Nandrolone Decanoate 200 MG/ML OIL Inject 100 mg as directed once a week.   OMNITROPE 5.8  MG SOLR ADD 1.14 ML DILUENT PER VIAL AND INJECT 0.15ML SUBCUTANEOUSLY MONDAY THROUGH FRIDAY AT BEDTIME. REFRIGERATE DO NOT FREEZE.   SYRINGE-NEEDLE, DISP, 3 ML 23G X 1" 3 ML MISC Use to inject testosterone as prescribed.   tamsulosin (FLOMAX) 0.4 MG CAPS capsule TAKE 1 CAPSULE BY MOUTH EVERY DAY IN THE MORNING   testosterone cypionate (DEPOTESTOSTERONE CYPIONATE) 200 MG/ML injection Inject 1.25 mls (250 mg total) into the muscle every 7 (seven) days.   aspirin EC 81 MG tablet  Take 1 tablet (81 mg total) by mouth daily. (Patient not taking: Reported on 04/25/2021)   Potassium Gluconate 2.5 MEQ TABS Take by mouth. (Patient not taking: Reported on 04/25/2021)   [DISCONTINUED] Simethicone 40 MG/0.6ML LIQD Take 0.6 mLs (40 mg total) by mouth 4 (four) times daily.   No facility-administered encounter medications on file as of 04/25/2021.    Allergies (verified) Morphine and related, Bactrim [sulfamethoxazole-trimethoprim], Statins, Sulfamethoxazole, and Trimethoprim   History: Past Medical History:  Diagnosis Date   Arthritis    Basal cell carcinoma 02/17/2020   infl & ulcerated-Left forehead (MOHS)   Coronary artery disease    GERD (gastroesophageal reflux disease)    H/O bronchitis    H/O hiatal hernia    History of kidney stones last in 1976   Hyperlipidemia    statin intolerant, under control   Hypertension    under control   Hypertensive retinopathy    OU   Hypothyroidism    Moderate persistent asthma without complication 06/10/1094   Retinal detachment    OD   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   left helix tx cx3 68fu   Sleep apnea    hx of, surgery to reverse   Past Surgical History:  Procedure Laterality Date   CARDIAC CATHETERIZATION Right 2008   5 heart stents   CATARACT EXTRACTION Bilateral 6 years ago   CYSTOSCOPY N/A 08/06/2012   Procedure: CYSTOSCOPY;  Surgeon: [name], MD;  Location: [facility];  Service: Urology;  Laterality: N/A;   ESOPHAGOGASTRIC FUNDOPLICATION  0454   spleen removed, 5 pints of blood given   EYE SURGERY     HERNIA REPAIR  1985   x4   INGUINAL HERNIA REPAIR  1992   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 Bernarda Caffey   TEE WITHOUT CARDIOVERSION N/A 10/25/2018   Procedure: TRANSESOPHAGEAL ECHOCARDIOGRAM (TEE);  Surgeon: Acie Fredrickson Wonda Cheng, MD;  Location: [facility];  Service: Cardiovascular;  Laterality: N/A;   TEMPORARY PACEMAKER N/A 10/25/2018   Procedure: TEMPORARY PACEMAKER;  Surgeon: Constance Haw, MD;  Location: [place];  Service: Cardiovascular;  Laterality: N/A;   TONSILLECTOMY  as child   TOTAL KNEE ARTHROPLASTY Right 08/03/2014   Procedure: TOTAL RIGHT KNEE ARTHROPLASTY;  Surgeon: [name], MD;  Location: [facility];  Service: Orthopedics;  Laterality: Right;   TRANSURETHRAL RESECTION OF PROSTATE N/A 08/06/2012   Procedure: [place] Free OF PROSTATE WITH GYRUS ;  Surgeon: [name], MD;  Location: [facility];  Service:  Urology;  Laterality: N/A;   UMBILICAL HERNIA REPAIR  1990   UVULECTOMY  2005   for sleep apnea   UVULOPALATOPHARYNGOPLASTY  6 years ago   Family History  Problem Relation Age of Onset   Heart attack Father 40       lived to 63   Alzheimer's disease Mother    Stroke Mother    Breast cancer Mother    Alzheimer's disease Maternal Grandmother    Stroke Maternal Grandmother    Lung cancer Maternal Grandmother    Lung cancer Maternal Grandfather    Cancer Paternal Grandmother        type unknown   Diabetes Paternal Grandfather    Other Paternal Grandfather        Growth in the back of the head   Social History   Socioeconomic History   Marital status: Divorced    Spouse name: Not on file   Number of children: 0   Years of education: Not on file   Highest education level: Not on file  Occupational History   Occupation: Retired   Tobacco Use   Smoking status: Never   Smokeless tobacco: Never  Vaping Use   Vaping Use: Never used  Substance and Sexual Activity   Alcohol  use: Yes    Alcohol/week: 4.0 standard drinks    Types: 4 Standard drinks or equivalent per week    Comment: 3 drinks at night    Drug use: No   Sexual activity: Not on file  Other Topics Concern   Not on file  Social History Narrative   Not on file   Social Determinants of Health   Financial Resource Strain: Low Risk    Difficulty of Paying Living Expenses: Not hard at all  Food Insecurity: No Food Insecurity   Worried About Charity fundraiser in the Last Year: Never true   [place] in the Last Year: Never true  Transportation Needs: No Transportation Needs   Lack of Transportation (Medical): No   Lack of Transportation (Non-Medical): No  Physical Activity: Insufficiently Active   Days of Exercise per Week: 3 days   Minutes of Exercise per Session: 30 min  Stress: No Stress Concern Present   Feeling of Stress : Not at all  Social Connections: Moderately Integrated   Frequency of Communication with Friends and Family: Twice a week   Frequency of Social Gatherings with Friends and Family: Twice a week   Attends Religious Services: Never   Marine scientist or Organizations: Yes   Attends Music therapist: 1 to 4 times per year   Marital Status: Married    Tobacco Counseling Counseling given: Not Answered   Clinical Intake:  Pre-visit preparation completed: Yes  Pain : No/denies pain     Nutritional Risks: None Diabetes: No  How often do you need to have someone help you when you read instructions, pamphlets, or other written materials from your doctor or pharmacy?: 1 - Never What is the last grade level you completed in school?: BS  Diabetic?no   Interpreter Needed?: No  Information entered by :: [place] of Daily Living In your present state of health, do you have any difficulty performing the following activities: 04/25/2021  Hearing? N  Vision? N  Difficulty concentrating or making decisions? N  Walking or  climbing stairs? N  Dressing or bathing? N  Doing errands, shopping? N  Preparing Food and eating ? N  Using  the Toilet? N  In the past six months, have you accidently leaked urine? N  Do you have problems with loss of bowel control? N  Managing your Medications? N  Managing your Finances? N  Housekeeping or managing your Housekeeping? N  Some recent data might be hidden    Patient Care Team: [name], MD as PCP - General (Internal Medicine) [name], MD as PCP - Cardiology (Cardiology) Lavonna Monarch, MD as Consulting Physician (Dermatology)  Indicate any recent Medical Services you may have received from other than [facility] providers in the past year (date may be approximate).     Assessment:   This is a routine wellness examination for [place].  Hearing/Vision screen Vision Screening - Comments:: Annual eye exam wear glasses   Dietary issues and exercise activities discussed: Current Exercise Habits: Home exercise routine, Type of exercise: walking, Time (Minutes): 30, Frequency (Times/Week): 3, Weekly Exercise (Minutes/Week): 90, Intensity: Mild, Exercise limited by: None identified   Goals Addressed               This Visit's Progress     Patient Stated (pt-stated)   On track     My goal is to lose 15 pounds.       Depression Screen PHQ 2/9 Scores 04/25/2021 04/25/2021 02/10/2020 12/22/2019 01/02/2019 06/27/2018 06/26/2017  PHQ - 2 Score 0 0 0 0 0 0 0    Fall Risk Fall Risk  04/25/2021 02/10/2020 12/22/2019 01/02/2019 06/27/2018  Falls in the past year? 0 0 0 - 0  Comment - - - - -  Number falls in past yr: 0 0 0 0 0  Injury with Fall? 0 0 0 0 0  Risk for fall due to : - No Fall Risks No Fall Risks - Orthopedic patient  Follow up Falls evaluation completed Falls evaluation completed;Education provided Falls evaluation completed - Falls evaluation completed;Education provided    FALL RISK PREVENTION PERTAINING TO THE HOME:  Any stairs in or around the  home? Yes  If so, are there any without handrails? No  Home free of loose throw rugs in walkways, pet beds, electrical cords, etc? No  Adequate lighting in your home to reduce risk of falls? No   ASSISTIVE DEVICES UTILIZED TO PREVENT FALLS:  Life alert? Yes  Use of a cane, walker or w/c? No  Grab bars in the bathroom? No  Shower chair or bench in shower? Yes  Elevated toilet seat or a handicapped toilet? Yes    Cognitive Function:  Normal cognitive status assessed by direct observation by this Nurse Health Advisor. No abnormalities found.     6CIT Screen 02/10/2020  What Year? 0 points  What month? 0 points  What time? 0 points  Count back from 20 0 points  Months in reverse 0 points  Repeat phrase 0 points  Total Score 0    Immunizations Immunization History  Administered Date(s) Administered   Fluad Quad(high Dose 65+) 04/01/2019, 03/22/2020   Influenza, High Dose Seasonal PF 07/13/2016, 04/19/2017   PFIZER(Purple Top)SARS-COV-2 Vaccination 06/24/2019, 07/15/2019   Pneumococcal Conjugate-13 06/26/2017   Pneumococcal Polysaccharide-23 12/16/2015   Tdap 06/26/2017   Zoster Recombinat (Shingrix) 06/14/2019, 10/20/2019    TDAP status: Up to date  Flu Vaccine status: Up to date  Pneumococcal vaccine status: Up to date  Covid-19 vaccine status: Completed vaccines  Qualifies for Shingles Vaccine? Yes   Zostavax completed No   Shingrix Completed?: No.    Education has been provided regarding the  importance of this vaccine. Patient has been advised to call insurance company to determine out of pocket expense if they have not yet received this vaccine. Advised may also receive vaccine at local pharmacy or Health Dept. Verbalized acceptance and understanding.  Screening Tests Health Maintenance  Topic Date Due   COVID-19 Vaccine (4 - Booster for Pfizer series) 12/02/2020   INFLUENZA VACCINE  01/03/2021   TETANUS/TDAP  06/27/2027   Pneumonia Vaccine 74+ Years old   Completed   Zoster Vaccines- Shingrix  Completed   HPV VACCINES  Aged Out    Health Maintenance  Health Maintenance Due  Topic Date Due   COVID-19 Vaccine (4 - Booster for Pfizer series) 12/02/2020   INFLUENZA VACCINE  01/03/2021    Colorectal cancer screening: No longer required.   Lung Cancer Screening: (Low Dose CT Chest recommended if Age 29-80 years, 30 pack-year currently smoking OR have quit w/in 15years.) does not qualify.   Lung Cancer Screening Referral: n/a  Additional Screening:  Hepatitis C Screening: does not qualify;   Vision Screening: Recommended annual ophthalmology exams for early detection of glaucoma and other disorders of the eye. Is the patient up to date with their annual eye exam?  Yes  Who is the provider or what is the name of the office in which the patient attends annual eye exams? Dr. Herbert Deaner  If pt is not established with a provider, would they like to be referred to a provider to establish care? No .   Dental Screening: Recommended annual dental exams for proper oral hygiene  Community Resource Referral / Chronic Care Management: CRR required this visit?  No   CCM required this visit?  No      Plan:     I have personally reviewed and noted the following in the patient's chart:   Medical and social history Use of alcohol, tobacco or illicit drugs  Current medications and supplements including opioid prescriptions. Patient is not currently taking opioid prescriptions. Functional ability and status Nutritional status Physical activity Advanced directives List of other physicians Hospitalizations, surgeries, and ER visits in previous 12 months Vitals Screenings to include cognitive, depression, and falls Referrals and appointments  In addition, I have reviewed and discussed with patient certain preventive protocols, quality metrics, and best practice recommendations. A written personalized care plan for preventive services as well as  general preventive health recommendations were provided to patient.     Randel Pigg, LPN   15/10/6977   Nurse Notes: none

## 2021-04-27 ENCOUNTER — Ambulatory Visit (INDEPENDENT_AMBULATORY_CARE_PROVIDER_SITE_OTHER): Payer: Medicare Other

## 2021-04-27 DIAGNOSIS — I495 Sick sinus syndrome: Secondary | ICD-10-CM

## 2021-04-27 LAB — CUP PACEART REMOTE DEVICE CHECK
Battery Remaining Longevity: 146 mo
Battery Voltage: 3.03 V
Brady Statistic AP VP Percent: 0.06 %
Brady Statistic AP VS Percent: 28.06 %
Brady Statistic AS VP Percent: 0.07 %
Brady Statistic AS VS Percent: 71.81 %
Brady Statistic RA Percent Paced: 28.05 %
Brady Statistic RV Percent Paced: 0.13 %
Date Time Interrogation Session: 20221123131056
Implantable Lead Implant Date: 20200527
Implantable Lead Implant Date: 20200527
Implantable Lead Location: 753859
Implantable Lead Location: 753860
Implantable Lead Model: 5076
Implantable Lead Model: 5076
Implantable Pulse Generator Implant Date: 20200527
Lead Channel Impedance Value: 361 Ohm
Lead Channel Impedance Value: 418 Ohm
Lead Channel Impedance Value: 418 Ohm
Lead Channel Impedance Value: 589 Ohm
Lead Channel Pacing Threshold Amplitude: 0.5 V
Lead Channel Pacing Threshold Amplitude: 0.625 V
Lead Channel Pacing Threshold Pulse Width: 0.4 ms
Lead Channel Pacing Threshold Pulse Width: 0.4 ms
Lead Channel Sensing Intrinsic Amplitude: 4.875 mV
Lead Channel Sensing Intrinsic Amplitude: 4.875 mV
Lead Channel Sensing Intrinsic Amplitude: 9.375 mV
Lead Channel Sensing Intrinsic Amplitude: 9.375 mV
Lead Channel Setting Pacing Amplitude: 1.5 V
Lead Channel Setting Pacing Amplitude: 2.5 V
Lead Channel Setting Pacing Pulse Width: 0.4 ms
Lead Channel Setting Sensing Sensitivity: 2 mV

## 2021-05-01 ENCOUNTER — Other Ambulatory Visit: Payer: Self-pay | Admitting: Internal Medicine

## 2021-05-01 DIAGNOSIS — E039 Hypothyroidism, unspecified: Secondary | ICD-10-CM

## 2021-05-01 DIAGNOSIS — H6504 Acute serous otitis media, recurrent, right ear: Secondary | ICD-10-CM

## 2021-05-01 DIAGNOSIS — H6981 Other specified disorders of Eustachian tube, right ear: Secondary | ICD-10-CM

## 2021-05-03 DIAGNOSIS — M19021 Primary osteoarthritis, right elbow: Secondary | ICD-10-CM | POA: Diagnosis not present

## 2021-05-04 DIAGNOSIS — Z23 Encounter for immunization: Secondary | ICD-10-CM | POA: Diagnosis not present

## 2021-05-09 NOTE — Progress Notes (Signed)
Remote pacemaker transmission.   

## 2021-05-17 ENCOUNTER — Other Ambulatory Visit: Payer: Self-pay | Admitting: Internal Medicine

## 2021-05-17 ENCOUNTER — Telehealth: Payer: Medicare Other | Admitting: Internal Medicine

## 2021-05-17 DIAGNOSIS — J0100 Acute maxillary sinusitis, unspecified: Secondary | ICD-10-CM

## 2021-05-17 DIAGNOSIS — E039 Hypothyroidism, unspecified: Secondary | ICD-10-CM

## 2021-05-17 MED ORDER — AMOXICILLIN-POT CLAVULANATE 875-125 MG PO TABS: 1.0000 | ORAL_TABLET | Freq: Two times a day (BID) | ORAL | 0 refills | Status: AC

## 2021-05-25 ENCOUNTER — Other Ambulatory Visit: Payer: Self-pay | Admitting: Internal Medicine

## 2021-05-25 DIAGNOSIS — H6504 Acute serous otitis media, recurrent, right ear: Secondary | ICD-10-CM

## 2021-05-25 DIAGNOSIS — H6981 Other specified disorders of Eustachian tube, right ear: Secondary | ICD-10-CM

## 2021-05-25 DIAGNOSIS — H6991 Unspecified Eustachian tube disorder, right ear: Secondary | ICD-10-CM

## 2021-05-30 ENCOUNTER — Other Ambulatory Visit: Payer: Self-pay | Admitting: Pulmonary Disease

## 2021-06-02 ENCOUNTER — Encounter: Payer: Self-pay | Admitting: Pulmonary Disease

## 2021-06-02 ENCOUNTER — Ambulatory Visit (INDEPENDENT_AMBULATORY_CARE_PROVIDER_SITE_OTHER): Payer: Medicare Other | Admitting: Pulmonary Disease

## 2021-06-02 ENCOUNTER — Other Ambulatory Visit: Payer: Self-pay

## 2021-06-02 VITALS — BP 120/72 | HR 60 | Ht 72.0 in | Wt 202.2 lb

## 2021-06-02 DIAGNOSIS — R1310 Dysphagia, unspecified: Secondary | ICD-10-CM | POA: Diagnosis not present

## 2021-06-02 DIAGNOSIS — G4733 Obstructive sleep apnea (adult) (pediatric): Secondary | ICD-10-CM | POA: Diagnosis not present

## 2021-06-02 DIAGNOSIS — J449 Chronic obstructive pulmonary disease, unspecified: Secondary | ICD-10-CM

## 2021-06-02 MED ORDER — ANORO ELLIPTA 62.5-25 MCG/ACT IN AEPB: INHALATION_SPRAY | RESPIRATORY_TRACT | 11 refills | Status: DC

## 2021-06-02 NOTE — Progress Notes (Signed)
Synopsis: Follow up for shortness of breath  Subjective:   PATIENT ID: [name] Glass: male DOB: Oct 29, 1940, MRN: [id number]  HPI  Chief Complaint  Patient presents with   Follow-up    96yr f/u for COPD. States he has been doing well since last visit. Still using Anoro.    [name] Glass is an 80 year old male with coronary artery disease status post stent in 2020, sick sinus syndrome s/p pacemaker, hypertension, allergic rhinitis, GERD and obstructive sleep apnea not on CPAP who returns to pulmonary clinic for COPD follow up.  He has been doing well on anoro ellipta inhaler since starting last fall which has significantly improved his exertional dyspnea. He reports he is able to walk longer periods of time without needing to stop.   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 He underwent two barium swallow evaluations which he eventually passed without noticeable signs of aspiration. He continues to report some difficulty with swallowing. He has lost weight as it is more cumbersome for him to focus on eating and swallowing properly  He is doing well on ipratropium nasal spray and fluticasone nasal spray for sinus congestion/drainage.   He has not received a CPAP machine that was previously ordered for him for the severe sleep apnea.  OV 03/22/20 He was last seen in clinic on 01/20/20. He has had pulmonary function tests which show mild obstructive lung disease with mild degree of air trapping with otherwise normal diffusion capacity and total lung capacity. His home sleep study on 02/11/20 shows severe obstructive sleep apnea with an AHI of 49.4/hr.  He was started on ipratropium nasal spray for chronic sinus drainage with out much improvement in post-nasal drip symptoms and cough.      OV 01/20/20  He reports over the past year he has had progressive  shortness of breath that is now limiting his daily activities and social activities. The shortness of breath is with exertion. 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 Echo on 10/25/2018 reviewed showing normal EF 55-60%. No diastolic dysfunction noted.    He has history of sinusitis and chronic rhinitis in which he takes fluticasone nasal spray. He has had sinus/septal surgery in the past. He complains of post-nasal drip and runny nose. He does not note sinus congestion. He also has history of moderate severe obstructive sleep apnea with an AHI of 37 based on a sleep study from 05/30/2005. He had uvula surgery to aid in the severity of the sleep apnea as he did not tolerate the CPAP mask. He does not report significant improvement in his sleep since the surgery but it has cut down on his snoring some.    He does report cough, more so in the morning with production of yellowish sputum. After he clears his chest in the morning he does not report having cough or wheezing throughout the day. He was placed on nebulizer treatments in the past which did not provide any relief.       Chest radiograph 06/09/2019 reviewed: signs of hyperinflation with flattened diaphragms and increased retrosternal airspace.    CTA Chest 10/22/2018 reviewed: lung parenchyma appears normal as well as the airways. The trachea has a slight saber sheath appearance.  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   left helix tx cx3 65fu   Sleep apnea    hx of, surgery to reverse     Family History  Problem Relation Age of Onset   Heart attack Father 42       lived to 89   Alzheimer's disease Mother    Stroke Mother    Breast cancer Mother    Alzheimer's disease Maternal Grandmother    Stroke Maternal Grandmother    Lung cancer Maternal Grandmother    Lung cancer Maternal Grandfather    Cancer Paternal Grandmother        type unknown   Diabetes Paternal Grandfather    Other Paternal Grandfather        Growth in the back of the head     Social History   Socioeconomic History   Marital status: Divorced    Spouse name: Not on file   Number of children: 0   Years of education: Not on file   Highest education level: Not on file  Occupational History   Occupation: Retired   Tobacco Use   Smoking status: Never   Smokeless tobacco: Never  Vaping Use   Vaping Use: Never used  Substance and Sexual Activity   Alcohol use: Yes    Alcohol/week: 4.0 standard drinks    Types: 4 Standard drinks or equivalent per week    Comment: 3 drinks at night    Drug use: No   Sexual activity: Not on file  Other Topics Concern   Not on file  Social History Narrative   Not on file   Social Determinants of Health   Financial Resource Strain: Low Risk    Difficulty of Paying Living Expenses: Not hard at all  Food Insecurity: No Food Insecurity   Worried About Charity fundraiser in the Last Year: Never true   [place] in the Last Year: Never true  Transportation Needs: No Transportation Needs   Lack of Transportation (Medical): No   Lack of Transportation (Non-Medical): No  Physical Activity: Insufficiently Active   Days of Exercise per Week: 3 days   Minutes of Exercise per Session: 30 min  Stress: No Stress Concern Present   Feeling of Stress :  Not at all  Social Connections: Moderately Integrated   Frequency of Communication with Friends and Family: Twice a week   Frequency of Social Gatherings with Friends and Family: Twice a week   Attends Religious Services: Never   Marine scientist or Organizations: Yes   Attends Music therapist: 1 to 4 times per year   Marital Status: Married  Human resources officer Violence: Not At Risk   Fear of Current or Ex-Partner: No   Emotionally Abused: No   Physically Abused: No   Sexually Abused: No     Allergies  Allergen Reactions  Morphine And Related     Had "crazy dreams" felt "crazy" per pt.   Bactrim [Sulfamethoxazole-Trimethoprim]    Statins    Sulfamethoxazole    Trimethoprim      Outpatient Medications Prior to Visit  Medication Sig Dispense Refill   acyclovir (ZOVIRAX) 200 MG capsule TAKE 1 CAPSULE BY MOUTH THREE TIMES A DAY 270 capsule 0   anastrozole (ARIMIDEX) 1 MG tablet TAKE 1 TABLET (1 MG TOTAL) BY MOUTH 2 (TWO) TIMES A WEEK. 24 tablet 1   ARMOUR THYROID 60 MG tablet TAKE 2 TABLETS BY MOUTH EVERY DAY 180 tablet 0   ezetimibe (ZETIA) 10 MG tablet TAKE 1 TABLET BY MOUTH EVERY DAY 90 tablet 1   finasteride (PROSCAR) 5 MG tablet TAKE 1 TABLET BY MOUTH EVERY DAY 90 tablet 1   fluticasone (FLONASE) 50 MCG/ACT nasal spray SPRAY 2 SPRAYS INTO EACH NOSTRIL EVERY DAY 48 mL 1   hydrALAZINE (APRESOLINE) 25 MG tablet TAKE 1 TABLET BY MOUTH THREE TIMES A DAY 270 tablet 1   ipratropium (ATROVENT) 0.03 % nasal spray Place 2 sprays into both nostrils 3 (three) times daily as needed for rhinitis. 30 mL 12   levocetirizine (XYZAL) 5 MG tablet TAKE 1 TABLET BY MOUTH EVERY DAY IN THE EVENING 90 tablet 1   liothyronine (CYTOMEL) 25 MCG tablet TAKE 1 TABLET BY MOUTH EVERY DAY IN THE MORNING 90 tablet 1   LIVALO 4 MG TABS TAKE 1 TABLET BY MOUTH EVERY DAY 90 tablet 1   montelukast (SINGULAIR) 10 MG tablet TAKE 1 TABLET BY MOUTH EVERYDAY AT BEDTIME 90 tablet 1   Nandrolone  Decanoate 200 MG/ML OIL Inject 100 mg as directed once a week. 10 mL 1   OMNITROPE 5.8 MG SOLR ADD 1.14 ML DILUENT PER VIAL AND INJECT 0.15ML SUBCUTANEOUSLY MONDAY THROUGH FRIDAY AT BEDTIME. REFRIGERATE DO NOT FREEZE. 10 each 0   Potassium Gluconate 2.5 MEQ TABS Take by mouth.     SYRINGE-NEEDLE, DISP, 3 ML 23G X 1" 3 ML MISC Use to inject testosterone as prescribed. 50 each 0   tamsulosin (FLOMAX) 0.4 MG CAPS capsule TAKE 1 CAPSULE BY MOUTH EVERY DAY IN THE MORNING 90 capsule 1   testosterone cypionate (DEPOTESTOSTERONE CYPIONATE) 200 MG/ML injection Inject 1.25 mls (250 mg total) into the muscle every 7 (seven) days. 20 mL 0   ANORO ELLIPTA 62.5-25 MCG/ACT AEPB TAKE 1 PUFF BY MOUTH EVERY DAY 60 each 1   aspirin EC 81 MG tablet Take 1 tablet (81 mg total) by mouth daily. (Patient not taking: Reported on 04/25/2021) 90 tablet 3   No facility-administered medications prior to visit.   Review of Systems  Constitutional:  Negative for chills, diaphoresis, fever, malaise/fatigue and weight loss.  HENT:  Negative for congestion, sinus pain and sore throat.   Eyes:  Negative for blurred vision.  Respiratory:  Negative for cough, hemoptysis, sputum production, shortness of breath and wheezing.   Cardiovascular:  Negative for chest pain, palpitations, orthopnea, claudication, leg swelling and PND.  Gastrointestinal:  Negative for abdominal pain, diarrhea, heartburn, nausea and vomiting.  Genitourinary:  Negative for dysuria and hematuria.  Musculoskeletal:  Negative for joint pain and myalgias.  Neurological:  Negative for dizziness, weakness and headaches.  Endo/Heme/Allergies:  Does not bruise/bleed easily.  Psychiatric/Behavioral: Negative.     Objective:   Vitals:   06/02/21 1013  BP: 120/72  Pulse: 60  SpO2: 94%  Weight: 202 lb 3.2 oz (91.7 kg)  Height: 6' (1.829 m)  Physical Exam Constitutional:      General: He is not in acute distress.    Appearance: Normal appearance. He is not  ill-appearing.  HENT:     Head: Normocephalic and atraumatic.  Eyes:     General: No scleral icterus.    Conjunctiva/sclera: Conjunctivae normal.  Cardiovascular:     Rate and Rhythm: Normal rate and regular rhythm.     Pulses: Normal pulses.     Heart sounds: Normal heart sounds. No murmur heard. Pulmonary:     Effort: Pulmonary effort is normal.     Breath sounds: Normal breath sounds.  Musculoskeletal:     Right lower leg: No edema.     Left lower leg: No edema.  Neurological:     General: No focal deficit present.     Mental Status: He is alert and oriented to person, place, and time. Mental status is at baseline.     Gait: Gait normal.  Psychiatric:        Mood and Affect: Mood normal.        Behavior: Behavior normal.        Thought Content: Thought content normal.        Judgment: Judgment normal.   CBC    Component Value Date/Time   WBC 7.0 10/07/2020 1557   RBC 5.32 10/07/2020 1557   HGB 16.3 10/07/2020 1557   HCT 49.0 10/07/2020 1557   PLT 251.0 10/07/2020 1557   MCV 92.2 10/07/2020 1557   MCH 29.2 12/22/2019 1155   MCHC 33.2 10/07/2020 1557   RDW 17.4 (H) 10/07/2020 1557   LYMPHSABS 2.0 10/07/2020 1557   MONOABS 1.3 (H) 10/07/2020 1557   EOSABS 0.1 10/07/2020 1557   BASOSABS 0.0 10/07/2020 1557   BMP Latest Ref Rng & Units 10/07/2020 12/22/2019 04/01/2019  Glucose 70 - 99 mg/dL 92 89 95  BUN 6 - 23 mg/dL 27(H) 18 14  Creatinine 0.40 - 1.50 mg/dL 1.39 1.11 1.10  BUN/Creat Ratio 6 - 22 (calc) - NOT APPLICABLE -  Sodium 353 - 145 mEq/L 140 142 141  Potassium 3.5 - 5.1 mEq/L 4.2 4.5 4.4  Chloride 96 - 112 mEq/L 103 107 106  CO2 19 - 32 mEq/L 30 29 28   Calcium 8.4 - 10.5 mg/dL 9.9 10.1 10.0   Chest imaging: Chest radiograph 06/09/2019 reviewed: signs of hyperinflation with flattened diaphragms and increased retrosternal airspace.    CTA Chest 10/22/2018 reviewed: lung parenchyma appears normal as well as the airways. The trachea has a slight saber sheath  appearance.  PFT: FEV1/FVC Pre 67 FEV1/FVC Post 72 FEV1 Post 2.18L (68%) FVC Post 3.03L (68%) TLC 89% RV 124% DLCO 87%  Labs: Reviewed, as above  Echo: 10/25/2018 reviewed showing normal EF 55-60%. No diastolic dysfunction noted.   Sleep Study 02/11/20 Severe obstructive sleep apnea/hypopnea syndrome AHI 49/4/hr, O2 nadir of 78% Suggest CPAP titration study or start auto-pap   Assessment & Plan:   Chronic obstructive pulmonary disease, unspecified COPD type ([place]) - Plan: Split night study, umeclidinium-vilanterol (ANORO ELLIPTA) 62.5-25 MCG/ACT AEPB, DISCONTINUED: umeclidinium-vilanterol (ANORO ELLIPTA) 62.5-25 MCG/ACT AEPB  Obstructive sleep apnea syndrome, severe - Plan: Split night study  Dysphagia, unspecified type - Plan: Ambulatory referral to Speech Therapy  Discussion: [name] Glass is an 80 year old male with coronary artery disease status post stent in 2020, sick sinus syndrome s/p pacemaker, hypertension, allergic rhinitis, GERD and obstructive sleep apnea not on CPAP who returns to pulmonary clinic for COPD follow up.   He is  to continue anoro ellipta daily for his COPD.   He is to continue ipratropium nasal spray and fluticasone nasal spray for sinus congestion and post-nasal drainage.   We will refer him to speech therapy for his therapy needs due to on going concern for aspiration after his recent pharyngeal surgery.   We will check a split night sleep study given his recent weight loss and also his significant oxygen desaturations on previous home study that may require supplemental oxygen with CPAP therapy. Patient also prefers to try a nasal pillow mask rather than full face mask.  Follow up in 6 months.   Freda Jackson, MD [facility] Office: [phone]    Current Outpatient Medications:    acyclovir (ZOVIRAX) 200 MG capsule, TAKE 1 CAPSULE BY MOUTH THREE TIMES A DAY, Disp: 270 capsule, Rfl: 0   anastrozole (ARIMIDEX) 1 MG  tablet, TAKE 1 TABLET (1 MG TOTAL) BY MOUTH 2 (TWO) TIMES A WEEK., Disp: 24 tablet, Rfl: 1   ARMOUR THYROID 60 MG tablet, TAKE 2 TABLETS BY MOUTH EVERY DAY, Disp: 180 tablet, Rfl: 0   ezetimibe (ZETIA) 10 MG tablet, TAKE 1 TABLET BY MOUTH EVERY DAY, Disp: 90 tablet, Rfl: 1   finasteride (PROSCAR) 5 MG tablet, TAKE 1 TABLET BY MOUTH EVERY DAY, Disp: 90 tablet, Rfl: 1   fluticasone (FLONASE) 50 MCG/ACT nasal spray, SPRAY 2 SPRAYS INTO EACH NOSTRIL EVERY DAY, Disp: 48 mL, Rfl: 1   hydrALAZINE (APRESOLINE) 25 MG tablet, TAKE 1 TABLET BY MOUTH THREE TIMES A DAY, Disp: 270 tablet, Rfl: 1   ipratropium (ATROVENT) 0.03 % nasal spray, Place 2 sprays into both nostrils 3 (three) times daily as needed for rhinitis., Disp: 30 mL, Rfl: 12   levocetirizine (XYZAL) 5 MG tablet, TAKE 1 TABLET BY MOUTH EVERY DAY IN THE EVENING, Disp: 90 tablet, Rfl: 1   liothyronine (CYTOMEL) 25 MCG tablet, TAKE 1 TABLET BY MOUTH EVERY DAY IN THE MORNING, Disp: 90 tablet, Rfl: 1   LIVALO 4 MG TABS, TAKE 1 TABLET BY MOUTH EVERY DAY, Disp: 90 tablet, Rfl: 1   montelukast (SINGULAIR) 10 MG tablet, TAKE 1 TABLET BY MOUTH EVERYDAY AT BEDTIME, Disp: 90 tablet, Rfl: 1   Nandrolone Decanoate 200 MG/ML OIL, Inject 100 mg as directed once a week., Disp: 10 mL, Rfl: 1   OMNITROPE 5.8 MG SOLR, ADD 1.14 ML DILUENT PER VIAL AND INJECT 0.15ML SUBCUTANEOUSLY MONDAY THROUGH FRIDAY AT BEDTIME. REFRIGERATE DO NOT FREEZE., Disp: 10 each, Rfl: 0   Potassium Gluconate 2.5 MEQ TABS, Take by mouth., Disp: , Rfl:    SYRINGE-NEEDLE, DISP, 3 ML 23G X 1" 3 ML MISC, Use to inject testosterone as prescribed., Disp: 50 each, Rfl: 0   tamsulosin (FLOMAX) 0.4 MG CAPS capsule, TAKE 1 CAPSULE BY MOUTH EVERY DAY IN THE MORNING, Disp: 90 capsule, Rfl: 1   testosterone cypionate (DEPOTESTOSTERONE CYPIONATE) 200 MG/ML injection, Inject 1.25 mls (250 mg total) into the muscle every 7 (seven) days., Disp: 20 mL, Rfl: 0   umeclidinium-vilanterol (ANORO ELLIPTA) 62.5-25  MCG/ACT AEPB, TAKE 1 PUFF BY MOUTH EVERY DAY, Disp: 60 each, Rfl: 11

## 2021-06-02 NOTE — Patient Instructions (Addendum)
Continue anoro ellipta 1 puff daily  Recommend having a split night sleep study to re-check your sleep apnea and get you setup with a mask and pressure settings.  We will follow up with you once we have sleep study results

## 2021-06-09 ENCOUNTER — Ambulatory Visit: Payer: Medicare Other | Attending: Pulmonary Disease | Admitting: Speech Pathology

## 2021-06-09 ENCOUNTER — Other Ambulatory Visit (HOSPITAL_COMMUNITY): Payer: Self-pay

## 2021-06-09 ENCOUNTER — Other Ambulatory Visit: Payer: Self-pay

## 2021-06-09 DIAGNOSIS — R059 Cough, unspecified: Secondary | ICD-10-CM

## 2021-06-09 DIAGNOSIS — R1313 Dysphagia, pharyngeal phase: Secondary | ICD-10-CM | POA: Insufficient documentation

## 2021-06-09 DIAGNOSIS — R1310 Dysphagia, unspecified: Secondary | ICD-10-CM

## 2021-06-09 NOTE — Patient Instructions (Addendum)
° ° ° ° ° °  Modified Barium Swallow at [facility] Jan 19th at 1:00 - check in at 12:45

## 2021-06-10 ENCOUNTER — Other Ambulatory Visit: Payer: Self-pay | Admitting: Internal Medicine

## 2021-06-10 DIAGNOSIS — J4540 Moderate persistent asthma, uncomplicated: Secondary | ICD-10-CM

## 2021-06-10 NOTE — Therapy (Signed)
[facility] [address] [place], [place], [address] Phone: [phone]   Fax:  [phone]  Speech Language Pathology Evaluation  Patient Details  Name: [name] Glass MRN: [id number] Date of Birth: 11-02-1940 Referring Provider (SLP): Dr. Freda Jackson   Encounter Date: 06/09/2021   End of Session - 06/10/21 1800     Visit Number 1    Number of Visits 25    Date for SLP Re-Evaluation 09/02/21    SLP Start Time 1315    SLP Stop Time  1400    SLP Time Calculation (min) 45 min    Activity Tolerance Patient tolerated treatment well             Past Medical History:  Diagnosis Date   Arthritis    Basal cell carcinoma 02/17/2020   infl & ulcerated-Left forehead (MOHS)   Coronary artery disease    GERD (gastroesophageal reflux disease)    H/O bronchitis    H/O hiatal hernia    History of kidney stones last in 1976   Hyperlipidemia    statin intolerant, under control   Hypertension    under control   Hypertensive retinopathy    OU   Hypothyroidism    Moderate persistent asthma without complication 01/03/8562   Retinal detachment    OD   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   left helix tx cx3 28fu   Sleep apnea    hx of, surgery to reverse    Past Surgical History:  Procedure Laterality Date   CARDIAC CATHETERIZATION Right 2008   5 heart stents   CATARACT EXTRACTION Bilateral 6 years ago   CYSTOSCOPY N/A 08/06/2012   Procedure: CYSTOSCOPY;  Surgeon: [name], MD;  Location: [facility];  Service: Urology;  Laterality: N/A;   ESOPHAGOGASTRIC FUNDOPLICATION  1497   spleen removed, 5 pints of blood given   EYE SURGERY     HERNIA REPAIR  1985   x4   INGUINAL HERNIA REPAIR  1992   PACEMAKER IMPLANT N/A 10/30/2018   Procedure: PACEMAKER IMPLANT;  Surgeon: Constance Haw, MD;  Location: [facility];  Service: Cardiovascular;  Laterality: N/A;   PACEMAKER INSERTION     PPM  GENERATOR REMOVAL N/A 10/25/2018   Procedure: PPM GENERATOR REMOVAL;  Surgeon: Constance Haw, MD;  Location: [facility];  Service: Cardiovascular;  Laterality: N/A;   PROSTATE ABLATION  2013   "laser ablation"   RETINAL DETACHMENT SURGERY Right 01/24/2019   Pneumatic cryopexy - Dr. Bernarda Caffey   TEE WITHOUT CARDIOVERSION N/A 10/25/2018   Procedure: TRANSESOPHAGEAL ECHOCARDIOGRAM (TEE);  Surgeon: Acie Fredrickson Wonda Cheng, MD;  Location: [facility];  Service: Cardiovascular;  Laterality: N/A;   TEMPORARY PACEMAKER N/A 10/25/2018   Procedure: TEMPORARY PACEMAKER;  Surgeon: Constance Haw, MD;  Location: [facility];  Service: Cardiovascular;  Laterality: N/A;   TONSILLECTOMY  as child   TOTAL KNEE ARTHROPLASTY Right 08/03/2014   Procedure: TOTAL RIGHT KNEE ARTHROPLASTY;  Surgeon: [name], MD;  Location: [facility];  Service: Orthopedics;  Laterality: Right;   TRANSURETHRAL RESECTION OF PROSTATE N/A 08/06/2012   Procedure: [place] Free OF PROSTATE WITH GYRUS ;  Surgeon: [name], MD;  Location: [facility];  Service: Urology;  Laterality: N/A;   UMBILICAL HERNIA REPAIR  1990   UVULECTOMY  2005   for sleep apnea   UVULOPALATOPHARYNGOPLASTY  6 years ago    There were no vitals filed for this  visit.   Subjective Assessment - 06/09/21 1321     Subjective "It gotten better since I left the hospital"    Currently in Pain? Yes    Pain Score 3     Pain Location Knee    Pain Orientation Right;Left    Pain Descriptors / Indicators Aching    Pain Onset More than a month ago    Pain Frequency Intermittent                SLP Evaluation [facility] - 06/10/21 1755       SLP Visit Information   SLP Received On 06/09/21    Referring Provider (SLP) Dr. 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 He underwent two modified barium swallow evaluations which he eventually passed without noticeable signs of aspiration. He continues to report some difficulty with swallowing. He has lost weight as it is more cumbersome for him to focus on eating and swallowing properly. 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 Hx of GERD      Balance Screen   Has the patient fallen in the past 6 months No    Has the patient had a decrease in activity level because of a fear of falling?  No    Is the patient reluctant to leave their home because of a fear of falling?  No      Prior Functional Status   Cognitive/Linguistic Baseline Within functional limits    Type of Home House     Lives With Spouse    Vocation Retired   Technical sales engineer   Overall Cognitive Status Within Functional Limits for tasks assessed      Auditory Comprehension   Overall [place] within functional limits for tasks assessed      Verbal Expression   Overall Verbal Expression Appears within functional limits for tasks assessed      Oral Motor/Sensory Function   Overall Oral Motor/Sensory Function Appears within functional limits for tasks assessed   denies any numbness in surgical area     Motor Speech   Overall Motor Speech Impaired    Respiration Within functional limits    Phonation Hoarse   Hoarse, however pt pleased with pitch and volume   Resonance Within functional limits    Articulation Within functional limitis    Intelligibility Intelligible                             SLP Education - 06/10/21 1759     Education Details MBSS at [facility] 1/19 at 1:00    Person(s) Educated Patient    Methods Explanation;Handout    Comprehension Verbalized understanding               SLP Short Term Goals - 06/10/21 1818       SLP SHORT TERM GOAL #1   Title Pt will complete repeat MBSS    Time 4    Period  Weeks    Status New      SLP SHORT TERM GOAL #2   Title Pt will follow diet modifications with rare min A over 2 sessions    Time 4    Period Weeks    Status New      SLP SHORT TERM GOAL #3   Title Pt will complete HEP for dysphagia with occasional min A over 2 sessions    Time 4    Period Weeks    Status New      SLP SHORT TERM GOAL #4   Title Pt will follow swallow precautions with occasional min A over 2 sessions    Time 4    Period Weeks    Status New              SLP Long Term Goals - 06/10/21 1820       SLP LONG TERM GOAL #1   Title Pt will follow diet modifications and swallow precautions with mod I over 2 sessions    Time 12    Period Weeks    Status New      SLP LONG TERM GOAL #2   Title Pt will complete HEP for dysphagia with mod I    Time 12    Period Weeks    Status New      SLP LONG TERM GOAL #3   Title Pt will verbalize s/s of aspiration pna with mod I    Time 12    Period Weeks    Status New      SLP LONG TERM GOAL #4   Title Pt will improve score on EAT-10 by 3 points    Baseline score of 20    Time 12    Period Weeks    Status New              Plan - 06/10/21 1800     Clinical Impression Statement [name] Mering "[name] Glass" is referred to outpt ST by his pulmonolgist due to ongoing dysphagia s/p excision of  large R pharyngeal mass (benign cyst)  with partial laryngectomy on 11/15/20. MBSS 11/22/20 revealed significant pharyngeal residue after the swallow and laryngeal penetration across consistencies and regular solids and thin liquids were recommended. [name] Glass denies any swallow precautions or diet modifications. At this time, I am only able to see the radiology report and not the SLP report. ENT scope 12/02/20 revealed vocal folds mobile without lesions or swelling. [name] Glass reports his swallowing has improved since  last MBSS, but he continues to sense food "backing up" and "getting stuck" consistently. No recent hx of pna or bronchitis He is not enjoying eating and has lost 20 lbs. He is avoiding eating out due to overt s/s of dysphagia. [name] Glass finds taking a liquid sip after a solid is helpful. He takes 1-2 pills at a time with liquid, occasionally coughing them up. He reports sneezing with meals resulting in some food escaping his nose and endorses change in voice quality and wet voice with eating.  He scored a 20 on the EAT-10, indicating swallowing solids and pills takes extra effort, he coughs when he eats, swallowing is stressful, and pleasure of eating is affected by his swallowing. PO trials of regular solids (peanutbutter crackers) and thin liquids revealed consistent immediate repetitive throat clearing & globus sensation. Effortful swallows did not eliminate globus, however subsequent water sips did help. Successive sips of water resulted in immediate coughing and [name] Glass felt the liquid "backed up" on susccessive  sips. Wet voice was noted intermittently during PO trials, when asked. [name] Glass stated he was aware of this. Of note, he reports belching, which was noted today and "foam" coming up into his throat at night and sometimes during the day, unrelated to meals. As it has been 6 months since last swallow study, and significant s/s of dysphagia persist, I recommend repeat MBSS. Although voice is hoarse, MBSS recommended due to esophageal sx, h/o GERD and globus sensation. I recommend skilled ST to maximize ease and efficiency of swallow and reduce risk of aspiration pna and train pt in HEP.    Speech Therapy Frequency 2x / week    Duration 12 weeks    Treatment/Interventions Aspiration precaution training;Pharyngeal strengthening exercises;Diet toleration management by SLP;NMES;Trials of upgraded texture/liquids;Other (comment)    Potential to Achieve Goals Good    Consulted and Agree with Plan of Care Patient              Patient will benefit from skilled therapeutic intervention in order to improve the following deficits and impairments:   Pharyngeal dysphagia - Plan: SLP modified barium swallow    Problem List Patient Active Problem List   Diagnosis Date Noted   LRTI (lower respiratory tract infection) 04/06/2021   Moderate aortic stenosis 10/27/2020   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   Thoracic aortic aneurysm 03/12/2020   Sinus node dysfunction ([place]) 02/17/2020   Mass of breast, left 12/22/2019   Moderate persistent asthma without complication 41/74/0814   Chronic bronchitis, mucopurulent ([place]) 06/09/2019   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   Degenerative disc disease, cervical 04/01/2019   Hypothyroidism    Presence of permanent cardiac pacemaker 10/23/2018   Sleep apnea 10/23/2018   Chronic bronchitis ([place]) 10/23/2018   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   Routine general medical examination at a health care facility 06/29/2017   PSA elevation 06/26/2017   Benign prostatic hyperplasia without lower urinary tract symptoms 05/21/2017   Seasonal allergic rhinitis due to pollen 05/18/2017   Chronic renal disease, stage 3, moderately decreased glomerular filtration rate (GFR) between 30-59 mL/min/1.73 square meter ([place]) 48/18/5631   Systolic murmur 49/70/2637   Gastroesophageal reflux disease without esophagitis 12/19/2015   Essential hypertension, benign 12/19/2015   Hyperlipidemia with target LDL less than 70 12/19/2015   OA (osteoarthritis) of knee 08/03/2014   Coronary artery disease 07/10/2014    Maciej Schweitzer, Annye Rusk, CCC-SLP 06/10/2021, 6:25 PM  [facility] [address] [place], [place], [address] Phone: [phone]   Fax:  [phone]  Name: [name] Glass MRN: [id number] Date of Birth: February 01, 1941

## 2021-06-23 ENCOUNTER — Other Ambulatory Visit: Payer: Self-pay

## 2021-06-23 ENCOUNTER — Ambulatory Visit (HOSPITAL_COMMUNITY)
Admission: RE | Admit: 2021-06-23 | Discharge: 2021-06-23 | Disposition: A | Discharge status: Home / Self Care | Payer: Medicare Other | Source: Ambulatory Visit | Attending: Pulmonary Disease | Admitting: Pulmonary Disease

## 2021-06-23 DIAGNOSIS — R1313 Dysphagia, pharyngeal phase: Secondary | ICD-10-CM | POA: Diagnosis not present

## 2021-06-23 DIAGNOSIS — R059 Cough, unspecified: Secondary | ICD-10-CM | POA: Diagnosis not present

## 2021-06-23 DIAGNOSIS — R1310 Dysphagia, unspecified: Secondary | ICD-10-CM | POA: Insufficient documentation

## 2021-06-24 ENCOUNTER — Other Ambulatory Visit: Payer: Self-pay | Admitting: Internal Medicine

## 2021-06-27 ENCOUNTER — Ambulatory Visit (INDEPENDENT_AMBULATORY_CARE_PROVIDER_SITE_OTHER): Payer: Medicare Other | Admitting: Internal Medicine

## 2021-06-27 ENCOUNTER — Encounter: Payer: Self-pay | Admitting: Internal Medicine

## 2021-06-27 ENCOUNTER — Other Ambulatory Visit: Payer: Self-pay

## 2021-06-27 DIAGNOSIS — J0110 Acute frontal sinusitis, unspecified: Secondary | ICD-10-CM

## 2021-06-27 MED ORDER — AMOXICILLIN-POT CLAVULANATE 875-125 MG PO TABS: 1.0000 | ORAL_TABLET | Freq: Two times a day (BID) | ORAL | 0 refills | Status: DC

## 2021-06-27 MED ORDER — HYDROCODONE BIT-HOMATROP MBR 5-1.5 MG/5ML PO SOLN: 5.0000 mL | Freq: Three times a day (TID) | ORAL | 0 refills | Status: DC | PRN

## 2021-06-27 NOTE — Assessment & Plan Note (Signed)
Rx augmentin and hydrocodone cough syrup. Advised to continue anoro daily and offered rx albuterol which he declines.

## 2021-06-27 NOTE — Patient Instructions (Addendum)
The number for the sleep center is [phone]  We have sent in augmentin to take 1 pill twice a day for 10 days.

## 2021-06-27 NOTE — Progress Notes (Signed)
° °  Subjective:   Patient ID: [name] Glass, male    DOB: 17-Aug-1940, 81 y.o.   MRN: [id number]  HPI The patient is an 81 YO man coming in for cough and SOB/wheezing. Started with sinus infection about 1 week ago.   Review of Systems  Constitutional:  Positive for activity change and appetite change. Negative for fatigue, fever and unexpected weight change.  HENT:  Positive for congestion, ear pain and sinus pain. Negative for ear discharge, sneezing, sore throat, tinnitus, trouble swallowing and voice change.   Eyes: Negative.   Respiratory:  Positive for cough, shortness of breath and wheezing. Negative for chest tightness.   Cardiovascular: Negative.  Negative for chest pain, palpitations and leg swelling.  Gastrointestinal: Negative.  Negative for abdominal distention, abdominal pain, constipation, diarrhea, nausea and vomiting.  Musculoskeletal: Negative.   Skin: Negative.   Neurological: Negative.   Psychiatric/Behavioral: Negative.     Objective:  Physical Exam Constitutional:      Appearance: He is well-developed.  HENT:     Head: Normocephalic and atraumatic.     Comments: Oropharynx with redness and clear drainage, nose with swollen turbinates, TMs normal bilaterally.  Neck:     Thyroid: No thyromegaly.  Cardiovascular:     Rate and Rhythm: Normal rate and regular rhythm.  Pulmonary:     Effort: Pulmonary effort is normal. No respiratory distress.     Breath sounds: Rhonchi present. No wheezing or rales.     Comments: Minimal rhonchi Abdominal:     General: Bowel sounds are normal. There is no distension.     Palpations: Abdomen is soft.     Tenderness: There is no abdominal tenderness. There is no rebound.  Musculoskeletal:        General: No tenderness.     Cervical back: Normal range of motion.  Lymphadenopathy:     Cervical: No cervical adenopathy.  Skin:    General: Skin is warm and dry.  Neurological:     Mental Status: He is alert and oriented to person,  place, and time.     Coordination: Coordination normal.    Vitals:   06/27/21 1447  BP: 124/80  Pulse: 64  Resp: 18  SpO2: 94%  Weight: 198 lb 6.4 oz (90 kg)  Height: 6' (1.829 m)    This visit occurred during the SARS-CoV-2 public health emergency.  Safety protocols were in place, including screening questions prior to the visit, additional usage of staff PPE, and extensive cleaning of exam room while observing appropriate contact time as indicated for disinfecting solutions.   Assessment & Plan:

## 2021-06-29 ENCOUNTER — Ambulatory Visit: Payer: Medicare Other | Admitting: Speech Pathology

## 2021-06-29 ENCOUNTER — Other Ambulatory Visit: Payer: Self-pay

## 2021-06-29 ENCOUNTER — Encounter: Payer: Self-pay | Admitting: Speech Pathology

## 2021-06-29 DIAGNOSIS — R1313 Dysphagia, pharyngeal phase: Secondary | ICD-10-CM

## 2021-06-29 NOTE — Patient Instructions (Addendum)
° °  Ask about MD if any pills have a liquid form or can be cut/crushed  Hard swallow with all food/drink Alternate solids with liquid sip  SWALLOWING EXERCISES Effortful Swallows - Squeeze hard with the muscles in your neck while you swallow your  saliva or a sip of water - Repeat 20 times, 2-3 times a day, and use whenever you eat or drink  [place] Swallow - swallow with your tongue sticking out - Stick tongue out and gently bite tongue with your teeth - Swallow, while holding your tongue with your teeth - Repeat 20 times, 2-3 times a day    [place] Exercise - head lift - Lie flat on your back in your bed or on a couch without pillows - Raise your head and look at your feet  - KEEP YOUR SHOULDERS DOWN - HOLD FOR 45 SECONDS, then lower your head back down - Repeat 3 times, 2-3 times a day  [employer] - half swallow exercise - Start to swallow, and keep your Adams apple up by squeezing hard with the muscles of the throat - Hold the squeeze for 5-7 seconds and then relax - Repeat 20 times, 2-3 times a day     5. Chin Tuck Against Resistance          - Place ball or noodle or rolled up towel under your ching        - Chin down hold ball with some pressure 30- 45 seconds        - 5x twice a day

## 2021-06-29 NOTE — Therapy (Signed)
[facility] [address] [place], [place], [address] Phone: [phone]   Fax:  [phone]  Speech Language Pathology Treatment  Patient Details  Name: [name] Glass MRN: [id number] Date of Birth: March 18, 1941 Referring Provider (SLP): [name] Glass   Encounter Date: 06/29/2021   End of Session - 06/29/21 1501     Visit Number 2    Number of Visits 25    Date for SLP Re-Evaluation 09/02/21    SLP Start Time 67    SLP Stop Time  1430    SLP Time Calculation (min) 30 min    Activity Tolerance Patient tolerated treatment well             Past Medical History:  Diagnosis Date   Arthritis    Basal cell carcinoma 02/17/2020   infl & ulcerated-Left forehead (MOHS)   Coronary artery disease    GERD (gastroesophageal reflux disease)    H/O bronchitis    H/O hiatal hernia    History of kidney stones last in 1976   Hyperlipidemia    statin intolerant, under control   Hypertension    under control   Hypertensive retinopathy    OU   Hypothyroidism    Moderate persistent asthma without complication 08/04/9516   Retinal detachment    OD   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   left helix tx cx3 92fu   Sleep apnea    hx of, surgery to reverse    Past Surgical History:  Procedure Laterality Date   CARDIAC CATHETERIZATION Right 2008   5 heart stents   CATARACT EXTRACTION Bilateral 6 years ago   CYSTOSCOPY N/A 08/06/2012   Procedure: CYSTOSCOPY;  Surgeon: [name] So, MD;  Location: [facility];  Service: Urology;  Laterality: N/A;   ESOPHAGOGASTRIC FUNDOPLICATION  8416   spleen removed, 5 pints of blood given   EYE SURGERY     HERNIA REPAIR  1985   x4   INGUINAL HERNIA REPAIR  1992   PACEMAKER IMPLANT N/A 10/30/2018   Procedure: PACEMAKER IMPLANT;  Surgeon: [name] Haw, MD;  Location: [facility];  Service: Cardiovascular;  Laterality: N/A;   PACEMAKER INSERTION     PPM  GENERATOR REMOVAL N/A 10/25/2018   Procedure: PPM GENERATOR REMOVAL;  Surgeon: [name] Haw, MD;  Location: [facility];  Service: Cardiovascular;  Laterality: N/A;   PROSTATE ABLATION  2013   "laser ablation"   RETINAL DETACHMENT SURGERY Right 01/24/2019   Pneumatic cryopexy - Dr. Bernarda Glass   TEE WITHOUT CARDIOVERSION N/A 10/25/2018   Procedure: TRANSESOPHAGEAL ECHOCARDIOGRAM (TEE);  Surgeon: [name] Fredrickson Wonda Cheng, MD;  Location: [facility];  Service: Cardiovascular;  Laterality: N/A;   TEMPORARY PACEMAKER N/A 10/25/2018   Procedure: TEMPORARY PACEMAKER;  Surgeon: [name] Haw, MD;  Location: [facility];  Service: Cardiovascular;  Laterality: N/A;   TONSILLECTOMY  as child   TOTAL KNEE ARTHROPLASTY Right 08/03/2014   Procedure: TOTAL RIGHT KNEE ARTHROPLASTY;  Surgeon: [name] Alf, MD;  Location: [facility];  Service: Orthopedics;  Laterality: Right;   TRANSURETHRAL RESECTION OF PROSTATE N/A 08/06/2012   Procedure: [name] Glass OF PROSTATE WITH GYRUS ;  Surgeon: [name] So, MD;  Location: [facility];  Service: Urology;  Laterality: N/A;   UMBILICAL HERNIA REPAIR  1990   UVULECTOMY  2005   for sleep apnea   UVULOPALATOPHARYNGOPLASTY  6 years ago    There were no vitals filed for this  visit.   Subjective Assessment - 06/29/21 1439     Subjective "If I hold take a big breath before I swallow, I can clear it out if it gets stuck"    Currently in Pain? Yes    Pain Score 3     Pain Location Knee    Pain Orientation Right;Left    Pain Descriptors / Indicators Aching    Pain Type Chronic pain    Pain Onset More than a month ago    Pain Frequency Intermittent                   ADULT SLP TREATMENT - 06/29/21 1440       General Information   Behavior/Cognition Alert;Cooperative;Pleasant mood      Treatment Provided   Treatment provided Dysphagia      Dysphagia Treatment   Temperature Spikes Noted No    Respiratory Status Room air    Oral Cavity -  Dentition Adequate natural dentition    Treatment Methods Therapeutic exercise;Compensation strategy training;Patient/caregiver education    Patient observed directly with PO's Yes    Type of PO's observed Thin liquids    Liquids provided via Cup    Oral Phase Signs & Symptoms --   none   Pharyngeal Phase Signs & Symptoms Immediate throat clear;Delayed throat clear;Complaints of residue    Type of cueing Verbal;Visual    Amount of cueing Moderate    Other treatment/comments Reviewed results and recommendations of MBSS. [name] Glass continues to have to hock up meds on occasion but has not followed recommendation to crush them. Instructed him to as about liquid med options. Initiated HEP for dysphagia. Pt requried usual mn to mod A for HEP, greatest cueing for CTAR and [place]. Encouraged him to complete these throughout the day as many can be done without a set time.      Assessment / Recommendations / Plan   Plan Continue with current plan of care      Dysphagia Recommendations   Diet recommendations Dysphagia 3 (mechanical soft);Thin liquid    Liquids provided via Cup    Medication Administration Crushed with puree    Supervision Patient able to self feed    Compensations Slow rate;Small sips/bites;Effortful swallow;Follow solids with liquid    Postural Changes and/or Swallow Maneuvers Seated upright 90 degrees      Progression Toward Goals   Progression toward goals Progressing toward goals              SLP Education - 06/29/21 1447     Education Details Swallow precautions, HEP    Person(s) Educated Patient    Methods Explanation;Demonstration;Verbal cues;Handout    Comprehension Verbalized understanding;Returned demonstration;Verbal cues required;Need further instruction              SLP Short Term Goals - 06/29/21 1500       SLP SHORT TERM GOAL #1   Title Pt will complete repeat MBSS    Time 4    Period Weeks    Status Achieved      SLP SHORT TERM GOAL #2   Title  Pt will follow diet modifications with rare min A over 2 sessions    Time 4    Period Weeks    Status On-going      SLP SHORT TERM GOAL #3   Title Pt will complete HEP for dysphagia with occasional min A over 2 sessions    Time 4    Period Weeks    Status  On-going      SLP SHORT TERM GOAL #4   Title Pt will follow swallow precautions with occasional min A over 2 sessions    Time 4    Period Weeks    Status On-going              SLP Long Term Goals - 06/29/21 1501       SLP LONG TERM GOAL #1   Title Pt will follow diet modifications and swallow precautions with mod I over 2 sessions    Time 12    Period Weeks    Status On-going      SLP LONG TERM GOAL #2   Title Pt will complete HEP for dysphagia with mod I    Time 12    Period Weeks    Status On-going      SLP LONG TERM GOAL #3   Title Pt will verbalize s/s of aspiration pna with mod I    Time 12    Period Weeks    Status On-going      SLP LONG TERM GOAL #4   Title Pt will improve score on EAT-10 by 3 points    Baseline score of 20    Time 12    Period Weeks    Status On-going              Plan - 06/29/21 1447     Clinical Impression Statement [name] Glass presents with moderate pharyngeal dysphagia s/p excision of large R pharyngeal mass (benign cyst) with partial larygecomee. MBSS 06/23/21 revealed significan pharyngeal residue, reduced epiglottic inverstion, reduced laryngeal elevation. Ongoing training in swallow precautions, diet modifications and HEP for dysphagia to maximize safety and ease/efficiency of swallow.    Speech Therapy Frequency 2x / week    Duration 12 weeks    Treatment/Interventions Aspiration precaution training;Pharyngeal strengthening exercises;Diet toleration management by SLP;Trials of upgraded texture/liquids;Other (comment);NMES;Compensatory techniques    Potential to Achieve Goals Fair    Potential Considerations Previous level of function             Patient will benefit  from skilled therapeutic intervention in order to improve the following deficits and impairments:   Pharyngeal dysphagia    Problem List Patient Active Problem List   Diagnosis Date Noted   LRTI (lower respiratory tract infection) 04/06/2021   Moderate aortic stenosis 10/27/2020   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/2020   Thoracic aortic aneurysm 03/12/2020   Sinus node dysfunction ([place]) 02/17/2020   Mass of breast, left 12/22/2019   Moderate persistent asthma without complication 73/53/2992   Chronic bronchitis, mucopurulent ([place]) 06/09/2019   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   Degenerative disc disease, cervical 04/01/2019   Hypothyroidism    Presence of permanent cardiac pacemaker 10/23/2018   Sleep apnea 10/23/2018   Chronic bronchitis ([place]) 10/23/2018   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   Routine general medical examination at a health care facility 06/29/2017   PSA elevation 06/26/2017   Benign prostatic hyperplasia without lower urinary tract symptoms 05/21/2017   Seasonal allergic rhinitis due to pollen 05/18/2017   Acute sinusitis 04/19/2017   Chronic renal disease, stage 3, moderately decreased glomerular filtration rate (GFR) between 30-59 mL/min/1.73 square meter ([place]) 42/68/3419   Systolic murmur 62/22/9798   Gastroesophageal reflux disease without esophagitis 12/19/2015   Essential hypertension, benign 12/19/2015   Hyperlipidemia with target LDL less than 70 12/19/2015   OA (osteoarthritis) of knee 08/03/2014   Coronary artery disease 07/10/2014  Chancy Smigiel, Annye Rusk, CCC-SLP 06/29/2021, 3:01 PM  [facility] [address] [place], [place], [address] Phone: [phone]   Fax:  [phone]   Name: [name] Glass MRN: [id number] Date of Birth: May 07, 1941

## 2021-06-30 ENCOUNTER — Ambulatory Visit: Payer: Medicare Other | Admitting: Speech Pathology

## 2021-06-30 ENCOUNTER — Other Ambulatory Visit: Payer: Self-pay | Admitting: Internal Medicine

## 2021-07-01 ENCOUNTER — Telehealth: Payer: Self-pay

## 2021-07-01 ENCOUNTER — Ambulatory Visit: Payer: Medicare Other

## 2021-07-01 ENCOUNTER — Other Ambulatory Visit: Payer: Self-pay

## 2021-07-01 ENCOUNTER — Other Ambulatory Visit: Payer: Self-pay | Admitting: Internal Medicine

## 2021-07-01 DIAGNOSIS — R1313 Dysphagia, pharyngeal phase: Secondary | ICD-10-CM

## 2021-07-01 DIAGNOSIS — E785 Hyperlipidemia, unspecified: Secondary | ICD-10-CM

## 2021-07-01 DIAGNOSIS — E039 Hypothyroidism, unspecified: Secondary | ICD-10-CM

## 2021-07-01 DIAGNOSIS — R972 Elevated prostate specific antigen [PSA]: Secondary | ICD-10-CM

## 2021-07-01 DIAGNOSIS — N1831 Chronic kidney disease, stage 3a: Secondary | ICD-10-CM

## 2021-07-01 DIAGNOSIS — E291 Testicular hypofunction: Secondary | ICD-10-CM

## 2021-07-01 DIAGNOSIS — N400 Enlarged prostate without lower urinary tract symptoms: Secondary | ICD-10-CM

## 2021-07-01 NOTE — Telephone Encounter (Signed)
Pt informed that labs have been ordered.

## 2021-07-01 NOTE — Telephone Encounter (Signed)
Pt is requesting to have labs done a few days before his scheduled CPE appt which is 08/16/21  Please advise Pt [phone]

## 2021-07-01 NOTE — Therapy (Signed)
[facility] [address] [facility], [place], [address] Phone: [phone]   Fax:  [phone]  Speech Language Pathology Treatment  Patient Details  Name: [name] Glass MRN: [id number] Date of Birth: Mar 16, 1941 Referring Provider (SLP): Dr. Freda Jackson   Encounter Date: 07/01/2021   End of Session - 07/01/21 1654     Visit Number 3    Number of Visits 25    Date for SLP Re-Evaluation 09/02/21    SLP Start Time 1324   5 mintues late   SLP Stop Time  4010    SLP Time Calculation (min) 38 min    Activity Tolerance Patient tolerated treatment well             Past Medical History:  Diagnosis Date   Arthritis    Basal cell carcinoma 02/17/2020   infl & ulcerated-Left forehead (MOHS)   Coronary artery disease    GERD (gastroesophageal reflux disease)    H/O bronchitis    H/O hiatal hernia    History of kidney stones last in 1976   Hyperlipidemia    statin intolerant, under control   Hypertension    under control   Hypertensive retinopathy    OU   Hypothyroidism    Moderate persistent asthma without complication 07/12/2534   Retinal detachment    OD   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   left helix tx cx3 14fu   Sleep apnea    hx of, surgery to reverse    Past Surgical History:  Procedure Laterality Date   CARDIAC CATHETERIZATION Right 2008   5 heart stents   CATARACT EXTRACTION Bilateral 6 years ago   CYSTOSCOPY N/A 08/06/2012   Procedure: CYSTOSCOPY;  Surgeon: [name], MD;  [facility]: [facility];  Service: Urology;  Laterality: N/A;   ESOPHAGOGASTRIC FUNDOPLICATION  6440   spleen removed, 5 pints of blood given   EYE SURGERY     HERNIA REPAIR  1985   x4   INGUINAL HERNIA REPAIR  1992   PACEMAKER IMPLANT N/A 10/30/2018   Procedure: PACEMAKER IMPLANT;  Surgeon: Constance Haw, MD;  Location: [facility];  Service: Cardiovascular;  Laterality: N/A;   PACEMAKER INSERTION      PPM GENERATOR REMOVAL N/A 10/25/2018   Procedure: PPM GENERATOR REMOVAL;  Surgeon: Constance Haw, MD;  Location: [facility];  Service: Cardiovascular;  Laterality: N/A;   PROSTATE ABLATION  2013   "laser ablation"   RETINAL DETACHMENT SURGERY Right 01/24/2019   Pneumatic cryopexy - Dr. Bernarda Caffey   TEE WITHOUT CARDIOVERSION N/A 10/25/2018   Procedure: TRANSESOPHAGEAL ECHOCARDIOGRAM (TEE);  Surgeon: Acie Fredrickson Wonda Cheng, MD;  Location: [facility];  Service: Cardiovascular;  Laterality: N/A;   TEMPORARY PACEMAKER N/A 10/25/2018   Procedure: TEMPORARY PACEMAKER;  Surgeon: Constance Haw, MD;  Location: [facility];  Service: Cardiovascular;  Laterality: N/A;   TONSILLECTOMY  as child   TOTAL KNEE ARTHROPLASTY Right 08/03/2014   Procedure: TOTAL RIGHT KNEE ARTHROPLASTY;  Surgeon: [name], MD;  Location: [facility];  Service: Orthopedics;  Laterality: Right;   TRANSURETHRAL RESECTION OF PROSTATE N/A 08/06/2012   Procedure: [place] OF PROSTATE WITH GYRUS ;  Surgeon: [name], MD;  [facility]: [facility];  Service: Urology;  Laterality: N/A;   UMBILICAL HERNIA REPAIR  1990   UVULECTOMY  2005   for sleep apnea   UVULOPALATOPHARYNGOPLASTY  6 years ago    There were no  vitals filed for this visit.   Subjective Assessment - 07/01/21 1410     Subjective "I get my throat good and wet and then get the pills good and wet and it goes down real good." (pt, his strategy for taking pills, when SLP brought up liquid medication instead of pills)    Currently in Pain? Yes    Pain Score 3     Pain Location Knee    Pain Orientation Right;Left    Pain Descriptors / Indicators Aching    Pain Type Chronic pain                   ADULT SLP TREATMENT - 07/01/21 1419       General Information   Behavior/Cognition Alert;Cooperative;Pleasant mood      Treatment Provided   Treatment provided Dysphagia      Dysphagia Treatment   Other treatment/comments See "s"  about meds. SLP educated pt on precautinos from MBSS - pt repeated back to the SLP all 4 precautions after 3-4 minutes, just prior to POs. Pt consistently cleared throat throughout POs. Initially, he coughed x2/2 after liquids and SLP explained rationale for this (liquid being too big -muscles weak, UES cannoto open long enough, etc) so pt needed to take smaller sips. Coughing did not occur after this time. Pt did state approx 50% of the time that  he felt minute amount of pharyngeal stasis but it cleared with a second swallow. SLP told pt to add that to his precautions. Pt req'd min a rarely for effortful swallow with POs. With HEP rare min A was needed.      Assessment / Recommendations / Plan   Plan Continue with current plan of care      Dysphagia Recommendations   Diet recommendations Dysphagia 3 (mechanical soft);Thin liquid    Liquids provided via Cup    Medication Administration Crushed with puree    Supervision Patient able to self feed    Compensations Slow rate;Small sips/bites;Effortful swallow;Follow solids with liquid    Postural Changes and/or Swallow Maneuvers Seated upright 90 degrees      Progression Toward Goals   Progression toward goals Progressing toward goals   not choosing to follow med swallow precautions             SLP Education - 07/01/21 1653     Education Details rationale for smaller sips, add additional swallow to swallow precautions    Person(s) Educated Patient    Methods Explanation;Demonstration    Comprehension Verbalized understanding;Returned demonstration              SLP Short Term Goals - 07/01/21 1655       SLP SHORT TERM GOAL #1   Title Pt will complete repeat MBSS    Time 4    Period Weeks    Status Achieved      SLP SHORT TERM GOAL #2   Title Pt will follow diet modifications with rare min A over 2 sessions    Time 4    Period Weeks    Status On-going      SLP SHORT TERM GOAL #3   Title Pt will complete HEP for dysphagia  with occasional min A over 2 sessions    Baseline 07-01-21    Time 4    Period Weeks    Status On-going      SLP SHORT TERM GOAL #4   Title Pt will follow swallow precautions with occasional min A over 2  sessions    Time 4    Period Weeks    Status On-going              SLP Long Term Goals - 07/01/21 1655       SLP LONG TERM GOAL #1   Title Pt will follow diet modifications and swallow precautions with mod I over 2 sessions    Time 12    Period Weeks    Status On-going      SLP LONG TERM GOAL #2   Title Pt will complete HEP for dysphagia with mod I    Time 12    Period Weeks    Status On-going      SLP LONG TERM GOAL #3   Title Pt will verbalize s/s of aspiration pna with mod I    Time 12    Period Weeks    Status On-going      SLP LONG TERM GOAL #4   Title Pt will improve score on EAT-10 by 3 points    Baseline score of 20    Time 12    Period Weeks    Status On-going              Plan - 07/01/21 1655     Clinical Impression Statement [name] presents with moderate pharyngeal dysphagia s/p excision of large R pharyngeal mass (benign cyst) with partial larygecomee. MBSS 06/23/21 revealed significan pharyngeal residue, reduced epiglottic inverstion, reduced laryngeal elevation. Ongoing training in swallow precautions, diet modifications and HEP for dysphagia to maximize safety and ease/efficiency of swallow.    Speech Therapy Frequency 2x / week    Duration 12 weeks    Treatment/Interventions Aspiration precaution training;Pharyngeal strengthening exercises;Diet toleration management by SLP;Trials of upgraded texture/liquids;Other (comment);NMES;Compensatory techniques    Potential to Achieve Goals Fair    Potential Considerations Previous level of function             Patient will benefit from skilled therapeutic intervention in order to improve the following deficits and impairments:   Pharyngeal dysphagia    Problem List Patient Active Problem List    Diagnosis Date Noted   Moderate aortic stenosis 10/27/2020   Thoracic aortic aneurysm 03/12/2020   Sinus node dysfunction ([place]) 02/17/2020   Moderate persistent asthma without complication 32/67/1245   Chronic bronchitis, mucopurulent ([place]) 06/09/2019   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   Degenerative disc disease, cervical 04/01/2019   Hypothyroidism    Presence of permanent cardiac pacemaker 10/23/2018   Sleep apnea 10/23/2018   Chronic bronchitis ([place]) 10/23/2018   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   Routine general medical examination at a health care facility 06/29/2017   PSA elevation 06/26/2017   Benign prostatic hyperplasia without lower urinary tract symptoms 05/21/2017   Seasonal allergic rhinitis due to pollen 05/18/2017   Chronic renal disease, stage 3, moderately decreased glomerular filtration rate (GFR) between 30-59 mL/min/1.73 square meter ([place]) 80/99/8338   Systolic murmur 25/10/3974   Gastroesophageal reflux disease without esophagitis 12/19/2015   Essential hypertension, benign 12/19/2015   Hyperlipidemia with target LDL less than 70 12/19/2015   OA (osteoarthritis) of knee 08/03/2014   Coronary artery disease 07/10/2014    [facility], CCC-SLP 07/01/2021, 4:56 PM  [facility] [address] [place], [place], [address] Phone: [phone]   Fax:  [phone]   Name: MYLES TAVELLA MRN: [id number] Date of Birth: Jun 01, 1941

## 2021-07-03 ENCOUNTER — Encounter (HOSPITAL_BASED_OUTPATIENT_CLINIC_OR_DEPARTMENT_OTHER): Payer: Medicare Other | Admitting: Pulmonary Disease

## 2021-07-04 ENCOUNTER — Ambulatory Visit: Payer: Medicare Other | Admitting: Speech Pathology

## 2021-07-04 ENCOUNTER — Encounter: Payer: Self-pay | Admitting: Speech Pathology

## 2021-07-04 ENCOUNTER — Other Ambulatory Visit: Payer: Self-pay

## 2021-07-04 DIAGNOSIS — R1313 Dysphagia, pharyngeal phase: Secondary | ICD-10-CM

## 2021-07-04 NOTE — Therapy (Signed)
[facility] [address] [place], [place], [address] Phone: [phone]   Fax:  [phone]  Speech Language Pathology Treatment  Patient Details  Name: [name] Glass MRN: [id number] Date of Birth: November 26, 1940 Referring Provider (SLP): Dr. Freda Jackson   Encounter Date: 07/04/2021   End of Session - 07/04/21 1401     Visit Number 4    Number of Visits 25    Date for SLP Re-Evaluation 09/02/21    SLP Start Time 74    SLP Stop Time  1400    SLP Time Calculation (min) 40 min    Activity Tolerance Patient tolerated treatment well             Past Medical History:  Diagnosis Date   Arthritis    Basal cell carcinoma 02/17/2020   infl & ulcerated-Left forehead (MOHS)   Coronary artery disease    GERD (gastroesophageal reflux disease)    H/O bronchitis    H/O hiatal hernia    History of kidney stones last in 1976   Hyperlipidemia    statin intolerant, under control   Hypertension    under control   Hypertensive retinopathy    OU   Hypothyroidism    Moderate persistent asthma without complication 6/0/7371   Retinal detachment    OD   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   left helix tx cx3 14fu   Sleep apnea    hx of, surgery to reverse    Past Surgical History:  Procedure Laterality Date   CARDIAC CATHETERIZATION Right 2008   5 heart stents   CATARACT EXTRACTION Bilateral 6 years ago   CYSTOSCOPY N/A 08/06/2012   Procedure: CYSTOSCOPY;  Surgeon: [name], MD;  Location: [facility];  Service: Urology;  Laterality: N/A;   ESOPHAGOGASTRIC FUNDOPLICATION  0626   spleen removed, 5 pints of blood given   EYE SURGERY     HERNIA REPAIR  1985   x4   INGUINAL HERNIA REPAIR  1992   PACEMAKER IMPLANT N/A 10/30/2018   Procedure: PACEMAKER IMPLANT;  Surgeon: Constance Haw, MD;  Location: [facility];  Service: Cardiovascular;  Laterality: N/A;   PACEMAKER INSERTION     PPM  GENERATOR REMOVAL N/A 10/25/2018   Procedure: PPM GENERATOR REMOVAL;  Surgeon: Constance Haw, MD;  Location: [facility];  Service: Cardiovascular;  Laterality: N/A;   PROSTATE ABLATION  2013   "laser ablation"   RETINAL DETACHMENT SURGERY Right 01/24/2019   Pneumatic cryopexy - Dr. Bernarda Caffey   TEE WITHOUT CARDIOVERSION N/A 10/25/2018   Procedure: TRANSESOPHAGEAL ECHOCARDIOGRAM (TEE);  Surgeon: Acie Fredrickson Wonda Cheng, MD;  Location: [facility];  Service: Cardiovascular;  Laterality: N/A;   TEMPORARY PACEMAKER N/A 10/25/2018   Procedure: TEMPORARY PACEMAKER;  Surgeon: Constance Haw, MD;  Location: [facility];  Service: Cardiovascular;  Laterality: N/A;   TONSILLECTOMY  as child   TOTAL KNEE ARTHROPLASTY Right 08/03/2014   Procedure: TOTAL RIGHT KNEE ARTHROPLASTY;  Surgeon: [name], MD;  Location: [facility];  Service: Orthopedics;  Laterality: Right;   TRANSURETHRAL RESECTION OF PROSTATE N/A 08/06/2012   Procedure: Almyra Free OF PROSTATE WITH GYRUS ;  Surgeon: [name], MD;  Location: [facility];  Service: Urology;  Laterality: N/A;   UMBILICAL HERNIA REPAIR  1990   UVULECTOMY  2005   for sleep apnea   UVULOPALATOPHARYNGOPLASTY  6 years ago    There were no vitals filed for this  visit.   Subjective Assessment - 07/04/21 1323     Subjective "I found one at [place]" re: ball    Currently in Pain? Yes    Pain Score 3     Pain Location Knee    Pain Orientation Right;Left    Pain Descriptors / Indicators Aching    Pain Type Chronic pain                   ADULT SLP TREATMENT - 07/04/21 1325       General Information   Behavior/Cognition Alert;Cooperative;Pleasant mood      Treatment Provided   Treatment provided Dysphagia      Dysphagia Treatment   Temperature Spikes Noted No    Respiratory Status Room air    Oral Cavity - Dentition Adequate natural dentition    Treatment Methods Skilled observation;Therapeutic exercise;Compensation  strategy training;Patient/caregiver education    Type of PO's observed Thin liquids    Feeding Able to feed self    Liquids provided via Cup    Pharyngeal Phase Signs & Symptoms Immediate throat clear;Delayed throat clear;Complaints of residue    Type of cueing Verbal;Visual    Other treatment/comments [name] Glass reports consistently doing the HEP for dysphagia twice a day, "it takes about 30 minutes" I am doing in twice a day.HEP completed  rare min A. Reviewed swallow precautions, [name] Glass verbalized these. Edcuated that it is harder to follow precautions at restaurants and parties, to make an effort to focus on swallowing when eating out. [name] Glass required verbal cues to not do [place] or other exercises, except effortful swallow, with water sip      Assessment / Recommendations / Plan   Plan Continue with current plan of care      Dysphagia Recommendations   Diet recommendations Dysphagia 3 (mechanical soft);Thin liquid    Liquids provided via Cup    Medication Administration Crushed with puree    Supervision Patient able to self feed    Compensations Slow rate;Small sips/bites;Effortful swallow;Follow solids with liquid    Postural Changes and/or Swallow Maneuvers Seated upright 90 degrees      Progression Toward Goals   Progression toward goals Progressing toward goals   elects to take meds with liqiud             SLP Education - 07/04/21 1359     Education Details HEP, swallow precautions    Person(s) Educated Patient    Methods Explanation;Demonstration;Verbal cues;Handout    Comprehension Verbalized understanding;Returned demonstration;Verbal cues required              SLP Short Term Goals - 07/04/21 1400       SLP SHORT TERM GOAL #1   Title Pt will complete repeat MBSS    Time 4    Period Weeks    Status Achieved      SLP SHORT TERM GOAL #2   Title Pt will follow diet modifications with rare min A over 2 sessions    Time 3    Period Weeks    Status On-going      SLP  SHORT TERM GOAL #3   Title Pt will complete HEP for dysphagia with occasional min A over 2 sessions    Baseline 07-01-21, 07-04-21    Time 3    Period Weeks    Status Achieved      SLP SHORT TERM GOAL #4   Title Pt will follow swallow precautions with occasional min A over 2 sessions  Time 3    Period Weeks    Status On-going              SLP Long Term Goals - 07/04/21 1401       SLP LONG TERM GOAL #1   Title Pt will follow diet modifications and swallow precautions with mod I over 2 sessions    Time 11    Period Weeks    Status On-going      SLP LONG TERM GOAL #2   Title Pt will complete HEP for dysphagia with mod I    Time 11    Period Weeks    Status On-going      SLP LONG TERM GOAL #3   Title Pt will verbalize s/s of aspiration pna with mod I    Time 11    Period Weeks    Status On-going      SLP LONG TERM GOAL #4   Title Pt will improve score on EAT-10 by 3 points    Baseline score of 20    Time 11    Period Weeks    Status On-going              Plan - 07/04/21 1400     Clinical Impression Statement [name] Glass presents with moderate pharyngeal dysphagia s/p excision of large R pharyngeal mass (benign cyst) with partial larygecomee. MBSS 06/23/21 revealed significan pharyngeal residue, reduced epiglottic inverstion, reduced laryngeal elevation. Ongoing training in swallow precautions, diet modifications and HEP for dysphagia to maximize safety and ease/efficiency of swallow.    Speech Therapy Frequency 2x / week    Duration 12 weeks    Treatment/Interventions Aspiration precaution training;Pharyngeal strengthening exercises;Diet toleration management by SLP;Trials of upgraded texture/liquids;Other (comment);NMES;Compensatory techniques    Potential to Achieve Goals Fair    Potential Considerations Previous level of function             Patient will benefit from skilled therapeutic intervention in order to improve the following deficits and impairments:    Pharyngeal dysphagia    Problem List Patient Active Problem List   Diagnosis Date Noted   Moderate aortic stenosis 10/27/2020   Thoracic aortic aneurysm 03/12/2020   Sinus node dysfunction ([place]) 02/17/2020   Moderate persistent asthma without complication 59/16/3846   Chronic bronchitis, mucopurulent ([place]) 06/09/2019   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   Degenerative disc disease, cervical 04/01/2019   Hypothyroidism    Presence of permanent cardiac pacemaker 10/23/2018   Sleep apnea 10/23/2018   Chronic bronchitis ([place]) 10/23/2018   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   Routine general medical examination at a health care facility 06/29/2017   PSA elevation 06/26/2017   Benign prostatic hyperplasia without lower urinary tract symptoms 05/21/2017   Seasonal allergic rhinitis due to pollen 05/18/2017   Chronic renal disease, stage 3, moderately decreased glomerular filtration rate (GFR) between 30-59 mL/min/1.73 square meter ([place]) 65/99/3570   Systolic murmur 17/79/3903   Gastroesophageal reflux disease without esophagitis 12/19/2015   Essential hypertension, benign 12/19/2015   Hyperlipidemia with target LDL less than 70 12/19/2015   OA (osteoarthritis) of knee 08/03/2014   Coronary artery disease 07/10/2014    [name] Glass, Annye Rusk, [place] 07/04/2021, 2:02 PM  [facility] [address] [place], [place], [address] Phone: [phone]   Fax:  [phone]   Name: [name] Glass MRN: [id number] Date of Birth: 10-13-1940

## 2021-07-04 NOTE — Patient Instructions (Addendum)
° ° °  Remember - you can only do the effortful swallow with water, all other exercises need to be done with dry swallow. If you need a sip, take it, swallow normal, then do the next exercise  You can split these up and do some in the car or shower or when watching TV  With the chin tuck and ball, make sure you are squeezing. If the foam ball is too soft, you may not get enough resistance   Small sips, alternate solids and liquids - use hard swallow with meals

## 2021-07-05 DIAGNOSIS — S338XXA Sprain of other parts of lumbar spine and pelvis, initial encounter: Secondary | ICD-10-CM | POA: Diagnosis not present

## 2021-07-05 DIAGNOSIS — M9903 Segmental and somatic dysfunction of lumbar region: Secondary | ICD-10-CM | POA: Diagnosis not present

## 2021-07-06 ENCOUNTER — Ambulatory Visit: Payer: Medicare Other | Attending: Internal Medicine | Admitting: Speech Pathology

## 2021-07-06 ENCOUNTER — Other Ambulatory Visit: Payer: Self-pay

## 2021-07-06 DIAGNOSIS — R1312 Dysphagia, oropharyngeal phase: Secondary | ICD-10-CM | POA: Insufficient documentation

## 2021-07-06 DIAGNOSIS — R1313 Dysphagia, pharyngeal phase: Secondary | ICD-10-CM | POA: Insufficient documentation

## 2021-07-06 NOTE — Therapy (Signed)
[name] Glass [address] [name], [place], [address] Phone: [phone]   Fax:  [phone]  Speech Language Pathology Treatment  Patient Details  Name: [name] Glass MRN: [id number] Date of Birth: [birth year] Referring Provider (SLP): Dr. Freda Jackson   Encounter Date: 07/06/2021   End of Session - 07/06/21 1354     Visit Number 5    Date for SLP Re-Evaluation 09/02/21    SLP Start Time 77    SLP Stop Time  1350    SLP Time Calculation (min) 30 min             Past Medical History:  Diagnosis Date   Arthritis    Basal cell carcinoma 02/17/2020   infl & ulcerated-Left forehead (MOHS)   Coronary artery disease    GERD (gastroesophageal reflux disease)    H/O bronchitis    H/O hiatal hernia    History of kidney stones last in 1976   Hyperlipidemia    statin intolerant, under control   Hypertension    under control   Hypertensive retinopathy    OU   Hypothyroidism    Moderate persistent asthma without complication 07/14/9369   Retinal detachment    OD   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   left helix tx cx3 57fu   Sleep apnea    hx of, surgery to reverse    Past Surgical History:  Procedure Laterality Date   CARDIAC CATHETERIZATION Right 2008   5 heart stents   CATARACT EXTRACTION Bilateral 6 years ago   CYSTOSCOPY N/A 08/06/2012   Procedure: CYSTOSCOPY;  Surgeon: [name], MD;  Location: [facility];  Service: Urology;  Laterality: N/A;   ESOPHAGOGASTRIC FUNDOPLICATION  6967   spleen removed, 5 pints of blood given   EYE SURGERY     HERNIA REPAIR  1985   x4   INGUINAL HERNIA REPAIR  1992   PACEMAKER IMPLANT N/A 10/30/2018   Procedure: PACEMAKER IMPLANT;  Surgeon: Constance Haw, MD;  Location: [facility];  Service: Cardiovascular;  Laterality: N/A;   PACEMAKER INSERTION     PPM GENERATOR REMOVAL N/A 10/25/2018   Procedure: PPM GENERATOR REMOVAL;  Surgeon: Constance Haw, MD;  Location: [facility];  Service: Cardiovascular;  Laterality: N/A;   PROSTATE ABLATION  2013   "laser ablation"   RETINAL DETACHMENT SURGERY Right 01/24/2019   Pneumatic cryopexy - Dr. Bernarda Caffey   TEE WITHOUT CARDIOVERSION N/A 10/25/2018   Procedure: TRANSESOPHAGEAL ECHOCARDIOGRAM (TEE);  Surgeon: Acie Fredrickson Wonda Cheng, MD;  Location: [facility];  Service: Cardiovascular;  Laterality: N/A;   TEMPORARY PACEMAKER N/A 10/25/2018   Procedure: TEMPORARY PACEMAKER;  Surgeon: Constance Haw, MD;  Location: [facility];  Service: Cardiovascular;  Laterality: N/A;   TONSILLECTOMY  as child   TOTAL KNEE ARTHROPLASTY Right 08/03/2014   Procedure: TOTAL RIGHT KNEE ARTHROPLASTY;  Surgeon: [name], MD;  Location: [facility];  Service: Orthopedics;  Laterality: Right;   TRANSURETHRAL RESECTION OF PROSTATE N/A 08/06/2012   Procedure: Almyra Free OF PROSTATE WITH GYRUS ;  Surgeon: [name], MD;  Location: [facility];  Service: Urology;  Laterality: N/A;   UMBILICAL HERNIA REPAIR  1990   UVULECTOMY  2005   for sleep apnea   UVULOPALATOPHARYNGOPLASTY  6 years ago    There were no vitals filed for this visit.   Subjective Assessment - 07/06/21 1324     Subjective "I had lobster  and steak and I left about a third of the steak because I got tired of swallowing"    Currently in Pain? Yes    Pain Score 3     Pain Location Knee    Pain Orientation Right;Left    Pain Descriptors / Indicators Aching    Pain Type Chronic pain    Pain Onset More than a month ago    Pain Frequency Constant                   ADULT SLP TREATMENT - 07/06/21 1326       General Information   Behavior/Cognition Alert;Cooperative;Pleasant mood      Treatment Provided   Treatment provided Dysphagia      Dysphagia Treatment   Temperature Spikes Noted No    Respiratory Status Room air    Oral Cavity - Dentition Adequate natural dentition    Treatment Methods Skilled  observation;Therapeutic exercise;Compensation strategy training;Patient/caregiver education    Patient observed directly with PO's Yes    Type of PO's observed Thin liquids;Dysphagia 3 (soft)   cerreal bar   Feeding Able to feed self    Liquids provided via Cup    Pharyngeal Phase Signs & Symptoms Immediate throat clear;Delayed throat clear    Type of cueing Verbal;Visual    Amount of cueing Minimal    Other treatment/comments [name] Glass. Today he followed swallow precautions of slow rate, alternating Glass and liquid and using hard swallow with rare min A to mod I. HEP completed with mod I. [name] Glass required 1 cue to not talk before he has swallowed.[name] Glass is completing HEP consistently at home      Assessment / Recommendations / [place] with current plan of care   reduce to 1x a week     Dysphagia Recommendations   Diet recommendations Dysphagia 3 (mechanical soft);Thin liquid    Liquids provided via Cup    Medication Administration Crushed with puree    Supervision Patient able to self feed    Compensations Slow rate;Small sips/bites;Effortful swallow;Follow Glass with liquid    Postural Changes and/or Swallow Maneuvers Seated upright 90 degrees      Progression Toward Goals   Progression toward goals Progressing toward goals              SLP Education - 07/06/21 1351     Education Details do not talk duirng meals    Person(s) Educated Patient    Methods Explanation;Demonstration;Verbal cues;Handout    Comprehension Verbalized understanding;Returned demonstration              SLP Short Term Goals - 07/06/21 1353       SLP SHORT TERM GOAL #1   Title Pt will complete repeat MBSS    Time 4    Period Weeks    Status Achieved      SLP SHORT TERM GOAL #2   Title Pt will follow diet modifications with rare min A over 2 sessions    Time 3    Period Weeks    Status Achieved      SLP SHORT TERM GOAL #3   Title Pt will  complete HEP for dysphagia with occasional min A over 2 sessions    Baseline 07-01-21, 07-04-21    Time 3    Period Weeks    Status Achieved      SLP SHORT TERM GOAL #4   Title Pt will follow swallow precautions  with occasional min A over 2 sessions    Time 3    Period Weeks    Status Achieved              SLP Long Term Goals - 07/06/21 1354       SLP LONG TERM GOAL #1   Title Pt will follow diet modifications and swallow precautions with mod I over 2 sessions    Time 11    Period Weeks    Status On-going      SLP LONG TERM GOAL #2   Title Pt will complete HEP for dysphagia with mod I    Time 11    Period Weeks    Status On-going      SLP LONG TERM GOAL #3   Title Pt will verbalize s/s of aspiration pna with mod I    Time 11    Period Weeks    Status On-going      SLP LONG TERM GOAL #4   Title Pt will improve score on EAT-10 by 3 points    Baseline score of 20    Time 11    Period Weeks    Status On-going              Plan - 07/06/21 1352     Clinical Impression Statement [name] Glass presents with moderate pharyngeal dysphagia s/p excision of large R pharyngeal mass (benign cyst) with partial larygecomee. MBSS 06/23/21 revealed significan pharyngeal residue, reduced epiglottic inverstion, reduced laryngeal elevation. Ongoing training in swallow precautions, diet modifications and HEP for dysphagia to maximize safety and ease/efficiency of swallow. [name] Glass is mod I with HEP and swallow precautions, He is electing to take regular Glass with avoidance of foods he perceives as difficult. Due to progess with HEP and swallow preacuations, reduced frequency to 1x a week    Speech Therapy Frequency 1x /week    Duration 12 weeks    Treatment/Interventions Aspiration precaution training;Pharyngeal strengthening exercises;Diet toleration management by SLP;Trials of upgraded texture/liquids;Other (comment);NMES;Compensatory techniques    Potential to Achieve Goals Fair    Potential  Considerations Previous level of function             Patient will benefit from skilled therapeutic intervention in order to improve the following deficits and impairments:   Pharyngeal dysphagia    Problem List Patient Active Problem List   Diagnosis Date Noted   Moderate aortic stenosis 10/27/2020   Thoracic aortic aneurysm 03/12/2020   Sinus node dysfunction ([place]) 02/17/2020   Moderate persistent asthma without complication 51/88/4166   Chronic bronchitis, mucopurulent ([place]) 06/09/2019   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   Degenerative disc disease, cervical 04/01/2019   Hypothyroidism    Presence of permanent cardiac pacemaker 10/23/2018   Sleep apnea 10/23/2018   Chronic bronchitis ([place]) 10/23/2018   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   Routine general medical examination at a health care facility 06/29/2017   PSA elevation 06/26/2017   Benign prostatic hyperplasia without lower urinary tract symptoms 05/21/2017   Seasonal allergic rhinitis due to pollen 05/18/2017   Chronic renal disease, stage 3, moderately decreased glomerular filtration rate (GFR) between 30-59 mL/min/1.73 square meter ([place]) 12/03/1599   Systolic murmur 09/32/3557   Gastroesophageal reflux disease without esophagitis 12/19/2015   Essential hypertension, benign 12/19/2015   Hyperlipidemia with target LDL less than 70 12/19/2015   OA (osteoarthritis) of knee 08/03/2014   Coronary artery disease 07/10/2014    Krystiana Fornes, Annye Rusk, [place] 07/06/2021, 1:54 PM  [facility]  [address] [place], [place], [address] Phone: [phone]   Fax:  [phone]   Name: ATILLA ZOLLNER MRN: [id number] Date of Birth: [birth year]

## 2021-07-06 NOTE — Patient Instructions (Addendum)
° °  Try not to talk before you swallow  You are doing great with the exercises, you can cut down to 1x a week for speech therapy  Keep up the practice 2x a day

## 2021-07-07 DIAGNOSIS — M9903 Segmental and somatic dysfunction of lumbar region: Secondary | ICD-10-CM | POA: Diagnosis not present

## 2021-07-07 DIAGNOSIS — S338XXA Sprain of other parts of lumbar spine and pelvis, initial encounter: Secondary | ICD-10-CM | POA: Diagnosis not present

## 2021-07-11 ENCOUNTER — Ambulatory Visit: Payer: Medicare Other | Admitting: Speech Pathology

## 2021-07-12 DIAGNOSIS — S338XXA Sprain of other parts of lumbar spine and pelvis, initial encounter: Secondary | ICD-10-CM | POA: Diagnosis not present

## 2021-07-12 DIAGNOSIS — M9903 Segmental and somatic dysfunction of lumbar region: Secondary | ICD-10-CM | POA: Diagnosis not present

## 2021-07-13 ENCOUNTER — Ambulatory Visit: Payer: Medicare Other | Admitting: Speech Pathology

## 2021-07-13 ENCOUNTER — Encounter: Payer: Self-pay | Admitting: Speech Pathology

## 2021-07-13 ENCOUNTER — Other Ambulatory Visit: Payer: Self-pay

## 2021-07-13 DIAGNOSIS — R1313 Dysphagia, pharyngeal phase: Secondary | ICD-10-CM | POA: Diagnosis not present

## 2021-07-13 DIAGNOSIS — R1312 Dysphagia, oropharyngeal phase: Secondary | ICD-10-CM | POA: Diagnosis not present
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
## 2021-07-13 NOTE — Patient Instructions (Addendum)
° °  If in about 4-6 weeks you see no improvement, consider returning to [facility] ENT or for second opinion:  Dr. Doug Sou - ENT [facility]  [phone]    Tell them you are bringing up meds frequently

## 2021-07-18 ENCOUNTER — Ambulatory Visit: Payer: Medicare Other | Admitting: Speech Pathology

## 2021-07-20 ENCOUNTER — Other Ambulatory Visit: Payer: Self-pay | Admitting: Internal Medicine

## 2021-07-25 ENCOUNTER — Encounter: Payer: Medicare Other | Admitting: Internal Medicine

## 2021-07-26 DIAGNOSIS — M9903 Segmental and somatic dysfunction of lumbar region: Secondary | ICD-10-CM | POA: Diagnosis not present

## 2021-07-26 DIAGNOSIS — S338XXA Sprain of other parts of lumbar spine and pelvis, initial encounter: Secondary | ICD-10-CM | POA: Diagnosis not present

## 2021-07-27 ENCOUNTER — Other Ambulatory Visit: Payer: Self-pay | Admitting: Internal Medicine

## 2021-07-27 ENCOUNTER — Telehealth: Payer: Self-pay | Admitting: Internal Medicine

## 2021-07-27 DIAGNOSIS — I2510 Atherosclerotic heart disease of native coronary artery without angina pectoris: Secondary | ICD-10-CM

## 2021-07-27 DIAGNOSIS — E785 Hyperlipidemia, unspecified: Secondary | ICD-10-CM

## 2021-07-27 MED ORDER — EZETIMIBE 10 MG PO TABS: 10.0000 mg | ORAL_TABLET | Freq: Every day | ORAL | 1 refills | Status: DC

## 2021-07-27 MED ORDER — LIVALO 4 MG PO TABS: 1.0000 | ORAL_TABLET | Freq: Every day | ORAL | 1 refills | Status: DC

## 2021-07-27 NOTE — Telephone Encounter (Signed)
1.Medication Requested: ezetimibe (ZETIA) 10 MG tablet  LIVALO 4 MG TABS  2. Pharmacy (Name, Street, [place]): [id number] - [place], [place] - [address]  3. On Med List: yes  4. Last Visit with PCP: 06-30-2021 ([facility])  5. Next visit date with PCP: 08-17-2021   Agent: Please be advised that RX refills may take up to 3 business days. We ask that you follow-up with your pharmacy.

## 2021-07-28 ENCOUNTER — Ambulatory Visit: Payer: Medicare Other | Admitting: Speech Pathology

## 2021-07-28 ENCOUNTER — Ambulatory Visit (HOSPITAL_BASED_OUTPATIENT_CLINIC_OR_DEPARTMENT_OTHER): Payer: Medicare Other | Attending: Pulmonary Disease | Admitting: Pulmonary Disease

## 2021-07-28 ENCOUNTER — Other Ambulatory Visit: Payer: Self-pay

## 2021-07-28 DIAGNOSIS — R1313 Dysphagia, pharyngeal phase: Secondary | ICD-10-CM | POA: Diagnosis not present

## 2021-07-28 DIAGNOSIS — R1312 Dysphagia, oropharyngeal phase: Secondary | ICD-10-CM

## 2021-07-28 DIAGNOSIS — G4733 Obstructive sleep apnea (adult) (pediatric): Secondary | ICD-10-CM | POA: Insufficient documentation

## 2021-07-28 DIAGNOSIS — J449 Chronic obstructive pulmonary disease, unspecified: Secondary | ICD-10-CM | POA: Insufficient documentation

## 2021-07-28 NOTE — Patient Instructions (Addendum)
Be mindful of throat clearing. Try and limit throat clearing if able.  Continue prescribed swallow exercises: Effortful Swallow Masako (tongue out) Mendelsohn (hold) Chin tuck against resistance  Continue with swallow precautions: Don't talk or laugh with food in mouth Small bites/sips Completely chew all food Follow solids with liquids  If swallow has not improved in 4 weeks, follow up with ENT.

## 2021-08-01 NOTE — Therapy (Signed)
[facility] [address] [place], [place], [address] Phone: [phone]   Fax:  [phone]  Speech Language Pathology Treatment & Discharge Summary  Patient Details  Name: [name] Glass MRN: [id number] Date of Birth: 07/18/1940 Referring Provider (SLP): Dr. Freda Jackson   Encounter Date: 07/28/2021   End of Session - 08/01/21 1513     Visit Number 8    Number of Visits 25    Date for SLP Re-Evaluation 09/02/21    SLP Start Time 4580    SLP Stop Time  1442    SLP Time Calculation (min) 37 min    Activity Tolerance Patient tolerated treatment well             Past Medical History:  Diagnosis Date   Arthritis    Basal cell carcinoma 02/17/2020   infl & ulcerated-Left forehead (MOHS)   Coronary artery disease    GERD (gastroesophageal reflux disease)    H/O bronchitis    H/O hiatal hernia    History of kidney stones last in 1976   Hyperlipidemia    statin intolerant, under control   Hypertension    under control   Hypertensive retinopathy    OU   Hypothyroidism    Moderate persistent asthma without complication 02/11/8337   Retinal detachment    OD   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   left helix tx cx3 67f   Sleep apnea    hx of, surgery to reverse    Past Surgical History:  Procedure Laterality Date   CARDIAC CATHETERIZATION Right 2008   5 heart stents   CATARACT EXTRACTION Bilateral 6 years ago   CYSTOSCOPY N/A 08/06/2012   Procedure: CYSTOSCOPY;  Surgeon: JMalka So MD;  [facility];  Service: Urology;  Laterality: N/A;   ESOPHAGOGASTRIC FUNDOPLICATION  12505  spleen removed, 5 pints of blood given   EYE SURGERY     HERNIA REPAIR  1985   x4   INGUINAL HERNIA REPAIR  1992   PACEMAKER IMPLANT N/A 10/30/2018   Procedure: PACEMAKER IMPLANT;  Surgeon: CConstance Haw MD;  [facility];  Service: Cardiovascular;  Laterality: N/A;   PACEMAKER  INSERTION     PPM GENERATOR REMOVAL N/A 10/25/2018   Procedure: PPM GENERATOR REMOVAL;  Surgeon: CConstance Haw MD;  [facility];  Service: Cardiovascular;  Laterality: N/A;   PROSTATE ABLATION  2013   "laser ablation"   RETINAL DETACHMENT SURGERY Right 01/24/2019   Pneumatic cryopexy - Dr. BBernarda Caffey  TEE WITHOUT CARDIOVERSION N/A 10/25/2018   Procedure: TRANSESOPHAGEAL ECHOCARDIOGRAM (TEE);  Surgeon: NAcie FredricksonPWonda Cheng MD;  [facility];  Service: Cardiovascular;  Laterality: N/A;   TEMPORARY PACEMAKER N/A 10/25/2018   Procedure: TEMPORARY PACEMAKER;  Surgeon: CConstance Haw MD;  [facility];  Service: Cardiovascular;  Laterality: N/A;   TONSILLECTOMY  as child   TOTAL KNEE ARTHROPLASTY Right 08/03/2014   Procedure: TOTAL RIGHT KNEE ARTHROPLASTY;  Surgeon: FGearlean Alf MD;  [facility];  Service: Orthopedics;  Laterality: Right;   TRANSURETHRAL RESECTION OF PROSTATE N/A 08/06/2012   Procedure: FAlmyra FreeOF PROSTATE WITH GYRUS ;  Surgeon: JMalka So MD;  [facility];  Service: Urology;  Laterality: N/A;   UMBILICAL HERNIA REPAIR  1990   UVULECTOMY  2005   for sleep apnea   UVULOPALATOPHARYNGOPLASTY  6 years ago    There were no vitals  filed for this visit.       SPEECH THERAPY DISCHARGE SUMMARY  Visits from Start of Care: 8  Current functional level related to goals / functional outcomes: See goals below   Remaining deficits: Pharyngeal dysphagia   Education / Equipment: HEP, swallow precautions, diet recommendations   Patient agrees to discharge. Patient goals were met. Patient is being discharged due to meeting the stated rehab goals.[name]          SLP Short Term Goals - 08/01/21 1512       SLP SHORT TERM GOAL #1   Title Pt will complete repeat MBSS    Time 4    Period Weeks    Status Achieved      SLP SHORT TERM GOAL #2   Title Pt will follow diet modifications with rare min A over 2  sessions    Time 3    Period Weeks    Status Achieved      SLP SHORT TERM GOAL #3   Title Pt will complete HEP for dysphagia with occasional min A over 2 sessions    Baseline 07-01-21, 07-04-21    Time 3    Period Weeks    Status Achieved      SLP SHORT TERM GOAL #4   Title Pt will follow swallow precautions with occasional min A over 2 sessions    Time 3    Period Weeks    Status Achieved              SLP Long Term Goals - 07/28/21 1437       SLP LONG TERM GOAL #1   Title Pt will follow diet modifications and swallow precautions with mod I over 2 sessions    Baseline 07/13/21; 07/28/21    Status Achieved      SLP LONG TERM GOAL #2   Title Pt will complete HEP for dysphagia with mod I    Baseline 07/13/21; 07/28/21    Status Achieved      SLP LONG TERM GOAL #3   Title Pt will verbalize s/s of aspiration pna with mod I    Baseline 07/28/21    Status Achieved      SLP LONG TERM GOAL #4   Title Pt will improve score on EAT-10 by 3 points    Baseline score of 20; 07/28/21 score 18    Status Partially Met              Plan - 08/01/21 1509     Clinical Impression Statement [name] Glass some improvement in swallowing, with reduced coughing with meals. He also Glass friends have commented that he is coughing less with meals. He improved score on EAT-10 from 20 to 18. [name] Glass is independent in swallow precautions and HEP for dysphagia. He is to continue HEP twice a day for 4 more weeks, then 1x a day for 4 weeks. If there is no improvement in swallowing, [name] Glass is to return to ENT for follow up. Goals met, education complete. D/C ST - pt in agreement    Speech Therapy Frequency 1x /week    Duration 12 weeks    Treatment/Interventions Aspiration precaution training;Pharyngeal strengthening exercises;Diet toleration management by SLP;Trials of upgraded texture/liquids;Other (comment);NMES;Compensatory techniques    Potential to Achieve Goals Fair    Potential Considerations Previous  level of function             Patient will benefit from skilled therapeutic intervention in order to improve the following deficits  and impairments:   Oropharyngeal dysphagia    Problem List Patient Active Problem List   Diagnosis Date Noted   Moderate aortic stenosis 10/27/2020   Thoracic aortic aneurysm 03/12/2020   Sinus node dysfunction ([place]) 02/17/2020   Moderate persistent asthma without complication 22/17/9810   Chronic bronchitis, mucopurulent ([place]) 06/09/2019   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   Degenerative disc disease, cervical 04/01/2019   Hypothyroidism    Presence of permanent cardiac pacemaker 10/23/2018   Sleep apnea 10/23/2018   Chronic bronchitis ([place]) 10/23/2018   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   Routine general medical examination at a health care facility 06/29/2017   PSA elevation 06/26/2017   Benign prostatic hyperplasia without lower urinary tract symptoms 05/21/2017   Seasonal allergic rhinitis due to pollen 05/18/2017   Chronic renal disease, stage 3, moderately decreased glomerular filtration rate (GFR) between 30-59 mL/min/1.73 square meter ([place]) 25/48/6282   Systolic murmur 41/75/3010   Gastroesophageal reflux disease without esophagitis 12/19/2015   Essential hypertension, benign 12/19/2015   Hyperlipidemia with target LDL less than 70 12/19/2015   OA (osteoarthritis) of knee 08/03/2014   Coronary artery disease 07/10/2014    Nikka Hakimian, Annye Rusk, CCC-SLP 08/01/2021, 3:14 PM  [facility] [address] [place], [place], [address] Phone: [phone]   Fax:  [phone]   Name: ARROW TOMKO MRN: [id number] Date of Birth: July 31, 1940

## 2021-08-02 DIAGNOSIS — S338XXA Sprain of other parts of lumbar spine and pelvis, initial encounter: Secondary | ICD-10-CM | POA: Diagnosis not present

## 2021-08-02 DIAGNOSIS — M9903 Segmental and somatic dysfunction of lumbar region: Secondary | ICD-10-CM | POA: Diagnosis not present

## 2021-08-05 DIAGNOSIS — G4733 Obstructive sleep apnea (adult) (pediatric): Secondary | ICD-10-CM

## 2021-08-05 DIAGNOSIS — J449 Chronic obstructive pulmonary disease, unspecified: Secondary | ICD-10-CM

## 2021-08-05 NOTE — Procedures (Signed)
Patient Name: [name] Glass, [name] Date: 07/28/2021 Gender: Male D.O.B: 06-Nov-1940 Age (years): 2 Referring Provider: Freda Jackson Height (inches): 93 Interpreting Physician: Chesley Mires MD, ABSM Weight (lbs): 190 RPSGT: Baxter Flattery BMI: 27 MRN: [id number] Neck Size: 17.00  CLINICAL INFORMATION Sleep Study Type: Split Night BiPAP  Indication for sleep study: OSA, Snoring, Witnessed apnea (327.23)  Epworth Sleepiness Score: 6  SLEEP STUDY TECHNIQUE As per the AASM Manual for the Scoring of Sleep and Associated Events v2.3 (April 2016) with a hypopnea requiring 4% desaturations.  The channels recorded and monitored were frontal, central and occipital EEG, electrooculogram (EOG), submentalis EMG (chin), nasal and oral airflow, thoracic and abdominal wall motion, anterior tibialis EMG, snore microphone, electrocardiogram, and pulse oximetry. Continuous positive airway pressure (CPAP) was initiated when the patient met split night criteria and was titrated according to treat sleep-disordered breathing.  MEDICATIONS Medications self-administered by patient taken the night of the study : N/A  RESPIRATORY PARAMETERS Diagnostic  Total AHI (/hr): 81.9 RDI (/hr): 81.9 OA Index (/hr): 80.5 CA Index (/hr): 0.0 REM AHI (/hr): N/A NREM AHI (/hr): 81.9 Supine AHI (/hr): 79.8 Non-supine AHI (/hr): 88.5 Min O2 Sat (%): 80.0 Mean O2 (%): 90.9 Time below 88% (min): 22.2   Titration  Optimal Pressure (cm): 17/13 AHI at Optimal Pressure (/hr): 12 Min O2 at Optimal Pressure (%): 88.0 Supine % at Optimal (%): 0 Sleep % at Optimal (%): 81    He was tried on CPAP.  He continued to have obstructive respiratory events with oxygen desaturation.  These improved when switched to Bipap.  SLEEP ARCHITECTURE The recording time for the entire night was 402 minutes.  During a baseline period of 207.5 minutes, the patient slept for 124.5 minutes in REM and nonREM, yielding a sleep efficiency  of 60.0%%. Sleep onset after lights out was 9.6 minutes with a REM latency of N/A minutes. The patient spent 2.8%% of the night in stage N1 sleep, 97.2%% in stage N2 sleep, 0.0%% in stage N3 and 0% in REM.  During the titration period of 188.4 minutes, the patient slept for 147.0 minutes in REM and nonREM, yielding a sleep efficiency of 78.0%%. Sleep onset after CPAP initiation was 8.9 minutes with a REM latency of 70.5 minutes. The patient spent 2.4%% of the night in stage N1 sleep, 68.4%% in stage N2 sleep, 0.0%% in stage N3 and 29.3% in REM.  CARDIAC DATA The 2 lead EKG demonstrated sinus rhythm, pacemaker generated. The mean heart rate was 100.0 beats per minute. Other EKG findings include: None.  LEG MOVEMENT DATA The total Periodic Limb Movements of Sleep (PLMS) were 0. The PLMS index was 0.0 .  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 - His sleep apnea was not adequately controlled with CPAP. - He did best with Bipap 17/13 cm H2O.  - He did not require the use of supplemental oxygen during this study.  DIAGNOSIS - Obstructive Sleep Apnea (G47.33)  RECOMMENDATIONS - Trial of BiPAP therapy on 17/13 cm H2O with a Large size Fisher&Paykel Full Face Simplus mask and heated humidification. - Avoid alcohol, sedatives and other CNS depressants that may worsen sleep apnea and disrupt normal sleep architecture. - Sleep hygiene should be reviewed to assess factors that may improve sleep quality. - Weight management and regular exercise should be initiated or continued.  [Electronically signed] 08/05/2021 08:50 AM  Chesley Mires MD, ABSM Diplomate, American Board of Sleep Medicine NPI:  [id number]  [place] PH: [phone]   FX: [phone] [place]

## 2021-08-08 ENCOUNTER — Telehealth: Payer: Self-pay | Admitting: Pulmonary Disease

## 2021-08-08 DIAGNOSIS — G4733 Obstructive sleep apnea (adult) (pediatric): Secondary | ICD-10-CM

## 2021-08-08 NOTE — Telephone Encounter (Signed)
Please let patient know the following results from his sleep study and place the following orders for him to start bipap therapy. ? ?He has severe sleep apnea with an apnea/hypopnea index of 82/hr. ? ?- Trial of BiPAP therapy on 17/13 cm H2O with a Large size Fisher&Paykel Full Face Simplus mask and heated humidification. ? ?Thanks, ?JD ?

## 2021-08-09 DIAGNOSIS — S338XXA Sprain of other parts of lumbar spine and pelvis, initial encounter: Secondary | ICD-10-CM | POA: Diagnosis not present

## 2021-08-09 DIAGNOSIS — M9903 Segmental and somatic dysfunction of lumbar region: Secondary | ICD-10-CM | POA: Diagnosis not present

## 2021-08-10 ENCOUNTER — Other Ambulatory Visit (INDEPENDENT_AMBULATORY_CARE_PROVIDER_SITE_OTHER): Payer: Medicare Other

## 2021-08-10 DIAGNOSIS — E785 Hyperlipidemia, unspecified: Secondary | ICD-10-CM

## 2021-08-10 DIAGNOSIS — R972 Elevated prostate specific antigen [PSA]: Secondary | ICD-10-CM

## 2021-08-10 DIAGNOSIS — N1831 Chronic kidney disease, stage 3a: Secondary | ICD-10-CM | POA: Diagnosis not present

## 2021-08-10 DIAGNOSIS — N400 Enlarged prostate without lower urinary tract symptoms: Secondary | ICD-10-CM | POA: Diagnosis not present

## 2021-08-10 DIAGNOSIS — E291 Testicular hypofunction: Secondary | ICD-10-CM

## 2021-08-10 DIAGNOSIS — E039 Hypothyroidism, unspecified: Secondary | ICD-10-CM | POA: Diagnosis not present

## 2021-08-10 LAB — URINALYSIS, ROUTINE W REFLEX MICROSCOPIC
Bilirubin Urine: NEGATIVE
Hgb urine dipstick: NEGATIVE
Ketones, ur: 15 — AB
Leukocytes,Ua: NEGATIVE
Nitrite: NEGATIVE
RBC / HPF: NONE SEEN (ref 0–?)
Specific Gravity, Urine: 1.02 (ref 1.000–1.030)
Total Protein, Urine: 100 — AB
Urine Glucose: NEGATIVE
Urobilinogen, UA: 0.2 (ref 0.0–1.0)
pH: 6 (ref 5.0–8.0)

## 2021-08-10 LAB — CBC WITH DIFFERENTIAL/PLATELET
Basophils Absolute: 0.1 10*3/uL (ref 0.0–0.1)
Basophils Relative: 1.1 % (ref 0.0–3.0)
Eosinophils Absolute: 0.1 10*3/uL (ref 0.0–0.7)
Eosinophils Relative: 1.8 % (ref 0.0–5.0)
HCT: 48.3 % (ref 39.0–52.0)
Hemoglobin: 16.1 g/dL (ref 13.0–17.0)
Lymphocytes Relative: 30 % (ref 12.0–46.0)
Lymphs Abs: 1.7 10*3/uL (ref 0.7–4.0)
MCHC: 33.4 g/dL (ref 30.0–36.0)
MCV: 97.4 fl (ref 78.0–100.0)
Monocytes Absolute: 0.9 10*3/uL (ref 0.1–1.0)
Monocytes Relative: 15 % — ABNORMAL HIGH (ref 3.0–12.0)
Neutro Abs: 3 10*3/uL (ref 1.4–7.7)
Neutrophils Relative %: 52.1 % (ref 43.0–77.0)
Platelets: 311 10*3/uL (ref 150.0–400.0)
RBC: 4.96 Mil/uL (ref 4.22–5.81)
RDW: 15.4 % (ref 11.5–15.5)
WBC: 5.7 10*3/uL (ref 4.0–10.5)

## 2021-08-10 LAB — HEPATIC FUNCTION PANEL
ALT: 21 U/L (ref 0–53)
AST: 20 U/L (ref 0–37)
Albumin: 4.5 g/dL (ref 3.5–5.2)
Alkaline Phosphatase: 45 U/L (ref 39–117)
Bilirubin, Direct: 0.2 mg/dL (ref 0.0–0.3)
Total Bilirubin: 1 mg/dL (ref 0.2–1.2)
Total Protein: 6.8 g/dL (ref 6.0–8.3)

## 2021-08-10 LAB — TSH: TSH: 1.04 u[IU]/mL (ref 0.35–5.50)

## 2021-08-10 LAB — LIPID PANEL
Cholesterol: 156 mg/dL (ref 0–200)
HDL: 48.3 mg/dL (ref >39.00)
LDL Cholesterol: 79 mg/dL (ref 0–99)
NonHDL: 107.28
Total CHOL/HDL Ratio: 3
Triglycerides: 141 mg/dL (ref 0.0–149.0)
VLDL: 28.2 mg/dL (ref 0.0–40.0)

## 2021-08-10 LAB — BASIC METABOLIC PANEL
BUN: 16 mg/dL (ref 6–23)
CO2: 26 mEq/L (ref 19–32)
Calcium: 10 mg/dL (ref 8.4–10.5)
Chloride: 105 mEq/L (ref 96–112)
Creatinine, Ser: 1.02 mg/dL (ref 0.40–1.50)
GFR: 69.13 mL/min (ref >60.00)
Glucose, Bld: 77 mg/dL (ref 70–99)
Potassium: 4.4 mEq/L (ref 3.5–5.1)
Sodium: 143 mEq/L (ref 135–145)

## 2021-08-10 LAB — PSA: PSA: 6.01 ng/mL — ABNORMAL HIGH (ref 0.10–4.00)

## 2021-08-10 NOTE — Telephone Encounter (Signed)
Called and spoke with patient. He stated that he was at a basketball game and would like for me to call him tomorrow between 9am-1130am. Advised him that I would give him a call tomorrow.  ?

## 2021-08-11 LAB — TESTOSTERONE TOTAL,FREE,BIO, MALES
Albumin: 4.2 g/dL (ref 3.6–5.1)
Sex Hormone Binding: 24 nmol/L (ref 22–77)
Testosterone, Bioavailable: 1621.8 ng/dL — ABNORMAL HIGH (ref 15.0–150.0)
Testosterone, Free: 842 pg/mL — ABNORMAL HIGH (ref 6.0–73.0)
Testosterone: 2653 ng/dL — ABNORMAL HIGH (ref 250–827)

## 2021-08-17 ENCOUNTER — Encounter: Payer: Self-pay | Admitting: Internal Medicine

## 2021-08-17 ENCOUNTER — Other Ambulatory Visit: Payer: Self-pay

## 2021-08-17 ENCOUNTER — Ambulatory Visit (INDEPENDENT_AMBULATORY_CARE_PROVIDER_SITE_OTHER): Payer: Medicare Other | Admitting: Internal Medicine

## 2021-08-17 VITALS — BP 122/72 | HR 57 | Temp 98.2°F | Ht 72.0 in | Wt 202.0 lb

## 2021-08-17 DIAGNOSIS — E291 Testicular hypofunction: Secondary | ICD-10-CM | POA: Diagnosis not present

## 2021-08-17 DIAGNOSIS — E039 Hypothyroidism, unspecified: Secondary | ICD-10-CM

## 2021-08-17 DIAGNOSIS — I2510 Atherosclerotic heart disease of native coronary artery without angina pectoris: Secondary | ICD-10-CM | POA: Diagnosis not present

## 2021-08-17 DIAGNOSIS — N400 Enlarged prostate without lower urinary tract symptoms: Secondary | ICD-10-CM

## 2021-08-17 MED ORDER — LIOTHYRONINE SODIUM 25 MCG PO TABS: ORAL_TABLET | ORAL | 1 refills | Status: DC

## 2021-08-17 MED ORDER — ANASTROZOLE 1 MG PO TABS: 1.0000 mg | ORAL_TABLET | ORAL | 1 refills | Status: DC

## 2021-08-17 MED ORDER — TESTOSTERONE CYPIONATE 200 MG/ML IM SOLN: 250.0000 mg | INTRAMUSCULAR | 0 refills | Status: DC

## 2021-08-17 MED ORDER — TAMSULOSIN HCL 0.4 MG PO CAPS: ORAL_CAPSULE | ORAL | 1 refills | Status: DC

## 2021-08-17 MED ORDER — THYROID 60 MG PO TABS: 60.0000 mg | ORAL_TABLET | Freq: Every day | ORAL | 1 refills | Status: DC

## 2021-08-17 NOTE — Progress Notes (Signed)
? ?Subjective:  ?Patient ID: [name] Glass, male    DOB: [birth year]  Age: 81 y.o. MRN: [id number] ? ?CC: Hypothyroidism ? ?This visit occurred during the SARS-CoV-2 public health emergency.  Safety protocols were in place, including screening questions prior to the visit, additional usage of staff PPE, and extensive cleaning of exam room while observing appropriate contact time as indicated for disinfecting solutions.   ? ?HPI ?[name] Glass presents for f/up - ? ?He has no new symptoms today. Wants to stay on the current meds. ? ?Outpatient Medications Prior to Visit  ?Medication Sig Dispense Refill  ? acyclovir (ZOVIRAX) 200 MG capsule TAKE 1 CAPSULE BY MOUTH THREE TIMES A DAY 270 capsule 0  ? ezetimibe (ZETIA) 10 MG tablet Take 1 tablet (10 mg total) by mouth daily. 90 tablet 1  ? finasteride (PROSCAR) 5 MG tablet TAKE 1 TABLET BY MOUTH EVERY DAY 90 tablet 1  ? fluticasone (FLONASE) 50 MCG/ACT nasal spray SPRAY 2 SPRAYS INTO EACH NOSTRIL EVERY DAY 48 mL 1  ? hydrALAZINE (APRESOLINE) 25 MG tablet TAKE 1 TABLET BY MOUTH THREE TIMES A DAY 270 tablet 0  ? ipratropium (ATROVENT) 0.03 % nasal spray Place 2 sprays into both nostrils 3 (three) times daily as needed for rhinitis. 30 mL 12  ? levocetirizine (XYZAL) 5 MG tablet TAKE 1 TABLET BY MOUTH EVERY DAY IN THE EVENING 90 tablet 1  ? montelukast (SINGULAIR) 10 MG tablet TAKE 1 TABLET BY MOUTH EVERYDAY AT BEDTIME 90 tablet 1  ? Nandrolone Decanoate 200 MG/ML OIL Inject 100 mg as directed once a week. 10 mL 1  ? OMNITROPE 5.8 MG SOLR ADD 1.14 ML DILUENT PER VIAL AND INJECT 0.15ML SUBCUTANEOUSLY MONDAY THROUGH FRIDAY AT BEDTIME. REFRIGERATE DO NOT FREEZE. 10 each 0  ? Pitavastatin Calcium (LIVALO) 4 MG TABS Take 1 tablet (4 mg total) by mouth daily. 90 tablet 1  ? Potassium Gluconate 2.5 MEQ TABS Take by mouth.    ? SYRINGE-NEEDLE, DISP, 3 ML 23G X 1" 3 ML MISC Use to inject testosterone as prescribed. 50 each 0  ? umeclidinium-vilanterol (ANORO ELLIPTA) 62.5-25 MCG/ACT  AEPB TAKE 1 PUFF BY MOUTH EVERY DAY 60 each 11  ? anastrozole (ARIMIDEX) 1 MG tablet TAKE 1 TABLET (1 MG TOTAL) BY MOUTH 2 (TWO) TIMES A WEEK. 24 tablet 1  ? ARMOUR THYROID 60 MG tablet TAKE 2 TABLETS BY MOUTH EVERY DAY 180 tablet 0  ? liothyronine (CYTOMEL) 25 MCG tablet TAKE 1 TABLET BY MOUTH EVERY DAY IN THE MORNING 90 tablet 1  ? tamsulosin (FLOMAX) 0.4 MG CAPS capsule TAKE 1 CAPSULE BY MOUTH EVERY DAY IN THE MORNING 90 capsule 1  ? testosterone cypionate (DEPOTESTOSTERONE CYPIONATE) 200 MG/ML injection Inject 1.25 mls (250 mg total) into the muscle every 7 (seven) days. 20 mL 0  ? ?No facility-administered medications prior to visit.  ? ? ?ROS ?Review of Systems  ?Constitutional: Negative.  Negative for chills, diaphoresis, fatigue and fever.  ?HENT: Negative.    ?Eyes: Negative.   ?Respiratory:  Positive for apnea, cough and shortness of breath. Negative for chest tightness and wheezing.   ?Cardiovascular:  Negative for chest pain, palpitations and leg swelling.  ?Gastrointestinal:  Negative for abdominal pain, constipation, diarrhea and vomiting.  ?Endocrine: Negative.   ?Genitourinary: Negative.  Negative for difficulty urinating.  ?Musculoskeletal: Negative.   ?Skin: Negative.   ?Neurological:  Negative for dizziness, weakness and light-headedness.  ?Hematological: Negative.   ?Psychiatric/Behavioral: Negative.    ? ?Objective:  ?  BP 122/72 (BP Location: Right Arm, Patient Position: Sitting, Cuff Size: Large)   Pulse (!) 57   Temp 98.2 ?F (36.8 ?C) (Oral)   Ht 6' (1.829 m)   Wt 202 lb (91.6 kg)   SpO2 94%   BMI 27.40 kg/m?  ? ?BP Readings from Last 3 Encounters:  ?08/17/21 122/72  ?06/27/21 124/80  ?06/02/21 120/72  ? ? ?Wt Readings from Last 3 Encounters:  ?08/17/21 202 lb (91.6 kg)  ?07/28/21 190 lb (86.2 kg)  ?06/27/21 198 lb 6.4 oz (90 kg)  ? ? ?Physical Exam ?Vitals reviewed.  ?Constitutional:   ?   General: He is not in acute distress. ?   Appearance: He is not ill-appearing, toxic-appearing or  diaphoretic.  ?HENT:  ?   Mouth/Throat:  ?   Mouth: Mucous membranes are moist.  ?Eyes:  ?   General: No scleral icterus. ?   Conjunctiva/sclera: Conjunctivae normal.  ?Cardiovascular:  ?   Rate and Rhythm: Normal rate and regular rhythm.  ?   Heart sounds: No murmur heard. ?Pulmonary:  ?   Effort: Pulmonary effort is normal.  ?   Breath sounds: No stridor. No wheezing, rhonchi or rales.  ?Abdominal:  ?   General: Abdomen is flat.  ?   Palpations: There is no mass.  ?   Tenderness: There is no abdominal tenderness. There is no guarding.  ?   Hernia: No hernia is present.  ?Musculoskeletal:     ?   General: Normal range of motion.  ?   Cervical back: Neck supple.  ?   Right lower leg: No edema.  ?   Left lower leg: No edema.  ?Lymphadenopathy:  ?   Cervical: No cervical adenopathy.  ?Skin: ?   General: Skin is warm and dry.  ?Neurological:  ?   General: No focal deficit present.  ?   Mental Status: He is alert.  ?Psychiatric:     ?   Mood and Affect: Mood normal.     ?   Behavior: Behavior normal.  ? ? ?Lab Results  ?Component Value Date  ? WBC 5.7 08/10/2021  ? HGB 16.1 08/10/2021  ? HCT 48.3 08/10/2021  ? PLT 311.0 08/10/2021  ? GLUCOSE 77 08/10/2021  ? CHOL 156 08/10/2021  ? TRIG 141.0 08/10/2021  ? HDL 48.30 08/10/2021  ? [place] 79 08/10/2021  ? ALT 21 08/10/2021  ? AST 20 08/10/2021  ? NA 143 08/10/2021  ? K 4.4 08/10/2021  ? CL 105 08/10/2021  ? CREATININE 1.02 08/10/2021  ? BUN 16 08/10/2021  ? CO2 26 08/10/2021  ? TSH 1.04 08/10/2021  ? PSA 6.01 (H) 08/10/2021  ? INR 0.96 07/28/2014  ? HGBA1C 6.1 07/21/2017  ? ? ?DG SWALLOW FUNC OP MEDICARE SPEECH PATH ? ?Result Date: 06/23/2021 ?Table formatting from the original result was not included. Objective Swallowing Evaluation: Type of Study: MBS-Modified Barium Swallow Study  Patient Details Name: [name] Glass MRN: [id number] Date of Birth: June 01, 1941 Today's Date: 06/23/2021 Time: SLP Start Time (ACUTE ONLY): 8469 -SLP Stop Time (ACUTE ONLY): 1350 SLP Time  Calculation (min) (ACUTE ONLY): 45 min Past Medical History: Past Medical History: Diagnosis Date  Arthritis   Basal cell carcinoma 02/17/2020  infl & ulcerated-Left forehead ([facility])  Coronary artery disease   GERD (gastroesophageal reflux disease)   H/O bronchitis   H/O hiatal hernia   History of kidney stones last in 1976  Hyperlipidemia   statin intolerant, under control  Hypertension   under  control  Hypertensive retinopathy   OU  Hypothyroidism   Moderate persistent asthma without complication 12/11/381  Retinal detachment   OD  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  left helix tx cx3 64f  Sleep apnea   hx of, surgery to reverse Past Surgical History: Past Surgical History: Procedure Laterality Date  CARDIAC CATHETERIZATION Right 2008  5 heart stents  CATARACT EXTRACTION Bilateral 6 years ago  CYSTOSCOPY N/A 08/06/2012  Procedure: CYSTOSCOPY;  Surgeon: JMalka So MD;  Location: [facility];  Service: Urology;  Laterality: N/A;  ESOPHAGOGASTRIC FUNDOPLICATION  13383 spleen removed, 5 pints of blood given  EYE SURGERY    HERNIA REPAIR  1985  x4  INGUINAL HERNIA REPAIR  1992  PACEMAKER IMPLANT N/A 10/30/2018  Procedure: PACEMAKER IMPLANT;  Surgeon: CConstance Haw MD;  [facility];  Service: Cardiovascular;  Laterality: N/A;  PACEMAKER INSERTION    PPM GENERATOR REMOVAL N/A 10/25/2018  Procedure: PPM GENERATOR REMOVAL;  Surgeon: CConstance Haw MD;  [facility];  Service: Cardiovascular;  Laterality: N/A;  PROSTATE ABLATION  2013  "laser ablation"  RETINAL DETACHMENT SURGERY Right 01/24/2019  Pneumatic cryopexy - Dr. BBernarda Caffey TEE WITHOUT CARDIOVERSION N/A 10/25/2018  Procedure: TRANSESOPHAGEAL ECHOCARDIOGRAM (TEE);  Surgeon: NAcie FredricksonPWonda Cheng MD;  [facility];  Service: Cardiovascular;  Laterality: N/A;  TEMPORARY PACEMAKER N/A 10/25/2018  Procedure: TEMPORARY PACEMAKER;  Surgeon: CConstance Haw MD;  [facility];  Service:  Cardiovascular;  Laterality: N/A;  TONSILLECTOMY  as child  TOTAL KNEE ARTHROPLASTY Right 08/03/2014  Procedure: TOTAL RIGHT KNEE ARTHROPLASTY;  Surgeon: FGearlean Alf MD;  Location: [facility];  Service: Orthopedics;  L

## 2021-08-17 NOTE — Telephone Encounter (Signed)
Called and spoke with patient. He verbalized understanding of results. He wishes to proceed with the bipap machine. I have placed a reminder for myself to check on him in 3 months to see if he has the machine. He is also aware of the DME process.  ? ?Order has been processed.  ? ?Nothing further needed at time of call.  ?

## 2021-08-17 NOTE — Patient Instructions (Signed)

## 2021-08-23 DIAGNOSIS — S338XXA Sprain of other parts of lumbar spine and pelvis, initial encounter: Secondary | ICD-10-CM | POA: Diagnosis not present

## 2021-08-23 DIAGNOSIS — M9903 Segmental and somatic dysfunction of lumbar region: Secondary | ICD-10-CM | POA: Diagnosis not present

## 2021-09-06 DIAGNOSIS — S338XXA Sprain of other parts of lumbar spine and pelvis, initial encounter: Secondary | ICD-10-CM | POA: Diagnosis not present

## 2021-09-06 DIAGNOSIS — M9903 Segmental and somatic dysfunction of lumbar region: Secondary | ICD-10-CM | POA: Diagnosis not present

## 2021-09-26 ENCOUNTER — Telehealth: Payer: Self-pay | Admitting: Pulmonary Disease

## 2021-09-26 NOTE — Telephone Encounter (Signed)
He needs fixed bipap setting of 17/13 cm H2O.  He does not need auto Bipap. ?

## 2021-09-26 NOTE — Telephone Encounter (Signed)
Spoke with Tammy at [facility]  ?She is asking if the pt needs pressure support of 4 with with autobipap order  ?Or should it just be regular bipap 17-13 cm? ? ?Please clarify, thank you! ?

## 2021-09-27 DIAGNOSIS — M9903 Segmental and somatic dysfunction of lumbar region: Secondary | ICD-10-CM | POA: Diagnosis not present

## 2021-09-27 DIAGNOSIS — S338XXA Sprain of other parts of lumbar spine and pelvis, initial encounter: Secondary | ICD-10-CM | POA: Diagnosis not present

## 2021-09-27 NOTE — Telephone Encounter (Signed)
Spoke with Corene Cornea from [facility] and verified pt's Bi-Pap setting. Corene Cornea stated understanding. Nothing further needed at this time.  ?

## 2021-10-03 ENCOUNTER — Telehealth: Payer: Self-pay

## 2021-10-03 NOTE — Telephone Encounter (Signed)
Approved ?Effective from 10/03/2021 through 10/04/2022. ?

## 2021-10-03 NOTE — Telephone Encounter (Signed)
Key: [id number] ?

## 2021-10-14 ENCOUNTER — Telehealth: Payer: Self-pay

## 2021-10-14 ENCOUNTER — Other Ambulatory Visit: Payer: Self-pay

## 2021-10-14 MED ORDER — OMNITROPE 5.8 MG ~~LOC~~ SOLR: SUBCUTANEOUS | 0 refills | Status: DC

## 2021-10-14 NOTE — Telephone Encounter (Signed)
Received Rx request via [facility] for this patient for OMNITROPE  5.'8MG'$ . Current instructions are to add 1.65m diluent per vial and inject 0.163msubcutaneously Monday-Friday at bedtime. Refrigerate, do not freeze. Will dispense a quantity of 10. ?

## 2021-10-15 ENCOUNTER — Emergency Department (HOSPITAL_BASED_OUTPATIENT_CLINIC_OR_DEPARTMENT_OTHER): Payer: Medicare Other

## 2021-10-15 ENCOUNTER — Other Ambulatory Visit: Payer: Self-pay

## 2021-10-15 ENCOUNTER — Encounter (HOSPITAL_BASED_OUTPATIENT_CLINIC_OR_DEPARTMENT_OTHER): Payer: Self-pay | Admitting: Emergency Medicine

## 2021-10-15 ENCOUNTER — Emergency Department (HOSPITAL_BASED_OUTPATIENT_CLINIC_OR_DEPARTMENT_OTHER)
Admission: EM | Admit: 2021-10-15 | Discharge: 2021-10-15 | Disposition: A | Discharge status: Home / Self Care | Payer: Medicare Other | Attending: Emergency Medicine | Admitting: Emergency Medicine

## 2021-10-15 DIAGNOSIS — R011 Cardiac murmur, unspecified: Secondary | ICD-10-CM | POA: Insufficient documentation

## 2021-10-15 DIAGNOSIS — Z20822 Contact with and (suspected) exposure to covid-19: Secondary | ICD-10-CM | POA: Insufficient documentation

## 2021-10-15 DIAGNOSIS — D72829 Elevated white blood cell count, unspecified: Secondary | ICD-10-CM | POA: Insufficient documentation

## 2021-10-15 DIAGNOSIS — Z950 Presence of cardiac pacemaker: Secondary | ICD-10-CM | POA: Diagnosis not present

## 2021-10-15 DIAGNOSIS — R0789 Other chest pain: Secondary | ICD-10-CM | POA: Diagnosis not present

## 2021-10-15 DIAGNOSIS — R7989 Other specified abnormal findings of blood chemistry: Secondary | ICD-10-CM | POA: Insufficient documentation

## 2021-10-15 DIAGNOSIS — I2510 Atherosclerotic heart disease of native coronary artery without angina pectoris: Secondary | ICD-10-CM | POA: Diagnosis not present

## 2021-10-15 DIAGNOSIS — R079 Chest pain, unspecified: Secondary | ICD-10-CM | POA: Diagnosis not present

## 2021-10-15 DIAGNOSIS — J811 Chronic pulmonary edema: Secondary | ICD-10-CM | POA: Diagnosis not present

## 2021-10-15 DIAGNOSIS — J168 Pneumonia due to other specified infectious organisms: Secondary | ICD-10-CM | POA: Diagnosis not present

## 2021-10-15 DIAGNOSIS — I10 Essential (primary) hypertension: Secondary | ICD-10-CM | POA: Insufficient documentation

## 2021-10-15 DIAGNOSIS — J189 Pneumonia, unspecified organism: Secondary | ICD-10-CM | POA: Diagnosis not present

## 2021-10-15 DIAGNOSIS — R778 Other specified abnormalities of plasma proteins: Secondary | ICD-10-CM | POA: Insufficient documentation

## 2021-10-15 DIAGNOSIS — R0781 Pleurodynia: Secondary | ICD-10-CM | POA: Diagnosis present

## 2021-10-15 DIAGNOSIS — R0602 Shortness of breath: Secondary | ICD-10-CM | POA: Diagnosis not present

## 2021-10-15 DIAGNOSIS — R071 Chest pain on breathing: Secondary | ICD-10-CM

## 2021-10-15 LAB — CBC WITH DIFFERENTIAL/PLATELET
Abs Immature Granulocytes: 0.04 10*3/uL (ref 0.00–0.07)
Basophils Absolute: 0.1 10*3/uL (ref 0.0–0.1)
Basophils Relative: 0 %
Eosinophils Absolute: 0 10*3/uL (ref 0.0–0.5)
Eosinophils Relative: 0 %
HCT: 47.4 % (ref 39.0–52.0)
Hemoglobin: 17 g/dL (ref 13.0–17.0)
Immature Granulocytes: 0 %
Lymphocytes Relative: 14 %
Lymphs Abs: 1.9 10*3/uL (ref 0.7–4.0)
MCH: 32.8 pg (ref 26.0–34.0)
MCHC: 35.9 g/dL (ref 30.0–36.0)
MCV: 91.3 fL (ref 80.0–100.0)
Monocytes Absolute: 1.9 10*3/uL — ABNORMAL HIGH (ref 0.1–1.0)
Monocytes Relative: 14 %
Neutro Abs: 9.6 10*3/uL — ABNORMAL HIGH (ref 1.7–7.7)
Neutrophils Relative %: 72 %
Platelets: 329 10*3/uL (ref 150–400)
RBC: 5.19 MIL/uL (ref 4.22–5.81)
RDW: 15 % (ref 11.5–15.5)
WBC: 13.5 10*3/uL — ABNORMAL HIGH (ref 4.0–10.5)
nRBC: 0 % (ref 0.0–0.2)

## 2021-10-15 LAB — TROPONIN I (HIGH SENSITIVITY)
Troponin I (High Sensitivity): 17 ng/L (ref <18)
Troponin I (High Sensitivity): 19 ng/L — ABNORMAL HIGH (ref <18)

## 2021-10-15 LAB — BASIC METABOLIC PANEL
Anion gap: 7 (ref 5–15)
BUN: 17 mg/dL (ref 8–23)
CO2: 27 mmol/L (ref 22–32)
Calcium: 9.6 mg/dL (ref 8.9–10.3)
Chloride: 107 mmol/L (ref 98–111)
Creatinine, Ser: 1.13 mg/dL (ref 0.61–1.24)
GFR, Estimated: >60 mL/min (ref >60)
Glucose, Bld: 107 mg/dL — ABNORMAL HIGH (ref 70–99)
Potassium: 4.1 mmol/L (ref 3.5–5.1)
Sodium: 141 mmol/L (ref 135–145)

## 2021-10-15 LAB — BRAIN NATRIURETIC PEPTIDE: B Natriuretic Peptide: 111.3 pg/mL — ABNORMAL HIGH (ref 0.0–100.0)

## 2021-10-15 LAB — RESP PANEL BY RT-PCR (FLU A&B, COVID) ARPGX2
Influenza A by PCR: NEGATIVE
Influenza B by PCR: NEGATIVE
SARS Coronavirus 2 by RT PCR: NEGATIVE

## 2021-10-15 LAB — D-DIMER, QUANTITATIVE: D-Dimer, Quant: 0.55 ug/mL-FEU — ABNORMAL HIGH (ref 0.00–0.50)

## 2021-10-15 MED ORDER — DOXYCYCLINE HYCLATE 100 MG PO CAPS: 100.0000 mg | ORAL_CAPSULE | Freq: Two times a day (BID) | ORAL | 0 refills | Status: AC

## 2021-10-15 MED ORDER — AMOXICILLIN-POT CLAVULANATE 875-125 MG PO TABS: 1.0000 | ORAL_TABLET | Freq: Two times a day (BID) | ORAL | 0 refills | Status: AC

## 2021-10-15 MED ORDER — ACETAMINOPHEN 325 MG PO TABS
650.0000 mg | ORAL_TABLET | Freq: Once | ORAL | Status: AC
  Administered 2021-10-15: 650 mg via ORAL
  Filled 2021-10-15: qty 2

## 2021-10-15 MED ORDER — FENTANYL CITRATE PF 50 MCG/ML IJ SOSY
25.0000 ug | PREFILLED_SYRINGE | Freq: Once | INTRAMUSCULAR | Status: AC
  Administered 2021-10-15: 25 ug via INTRAVENOUS
  Filled 2021-10-15: qty 1

## 2021-10-15 MED ORDER — SODIUM CHLORIDE 0.9 % IV SOLN
3.0000 g | Freq: Once | INTRAVENOUS | Status: AC
  Administered 2021-10-15: 3 g via INTRAVENOUS

## 2021-10-15 MED ORDER — IOHEXOL 300 MG/ML  SOLN
75.0000 mL | Freq: Once | INTRAMUSCULAR | Status: AC | PRN
  Administered 2021-10-15: 75 mL via INTRAVENOUS

## 2021-10-15 MED ORDER — ASPIRIN 325 MG PO TABS
325.0000 mg | ORAL_TABLET | Freq: Every day | ORAL | Status: DC
  Filled 2021-10-15: qty 1

## 2021-10-15 MED ORDER — KETOROLAC TROMETHAMINE 15 MG/ML IJ SOLN
15.0000 mg | Freq: Once | INTRAMUSCULAR | Status: AC
  Administered 2021-10-15: 15 mg via INTRAVENOUS
  Filled 2021-10-15: qty 1

## 2021-10-15 NOTE — Discharge Instructions (Addendum)
You were evaluated in the Emergency Department and after careful evaluation, we did not find any emergent condition requiring admission or further testing in the hospital. Please return to the ED for worsening symptoms. ? ?Your exam/testing today was overall reassuring.  We will treat with ABX for developing pneumonia. Your second cardiac enzyme was mildly elevated but flat. Low concern for heart attack at this time as your symptoms are more consistent with a developing pneumonia. Discussed your case with on-call cardiology. Your pacemaker was interrogated and revealed no sustained abnormal cardiac arrhyhtmia. Your CT results are as follows: ?  ?IMPRESSION:  ?1. Peribronchovascular opacities at the lung bases, mostly in the  ?lower lobes, likely atelectasis. Consider infection if there are  ?consistent clinical findings.  ?2. No other evidence of acute cardiopulmonary disease.  ?3. Coronary artery calcifications and aortic atherosclerosis.  ?4. Ascending aorta measures 4 cm in diameter, without change from  ?the prior CT. Recommend annual imaging followup by CTA or MRA. This  ?recommendation follows 2010  ?ACCF/AHA/AATS/ACR/ASA/SCA/SCAI/SIR/STS/SVM Guidelines for the  ?Diagnosis and Management of Patients with Thoracic Aortic Disease.  ?Circulation. 2010; 121: F790-W409. Aortic aneurysm NOS (ICD10-I71.9)  ?   ?Aortic Atherosclerosis (ICD10-I70.0).  ? ? ?Please return to the Emergency Department if you experience any worsening of your condition.  Thank you for allowing [place] to be a part of your care. ? ?

## 2021-10-15 NOTE — ED Notes (Signed)
Spoke with Carol in lab  to add on BNP 

## 2021-10-15 NOTE — ED Notes (Signed)
Patient transported to CT 
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
## 2021-10-15 NOTE — ED Notes (Signed)
Attempted pacemaker interrogation x 2; the device reads the pacemaker, but will not transfer the data; [employer] rep paged ?

## 2021-10-23 ENCOUNTER — Other Ambulatory Visit: Payer: Self-pay | Admitting: Internal Medicine

## 2021-10-26 ENCOUNTER — Ambulatory Visit (INDEPENDENT_AMBULATORY_CARE_PROVIDER_SITE_OTHER): Payer: Medicare Other

## 2021-10-26 DIAGNOSIS — I495 Sick sinus syndrome: Secondary | ICD-10-CM | POA: Diagnosis not present

## 2021-10-26 LAB — CUP PACEART REMOTE DEVICE CHECK
Battery Remaining Longevity: 140 mo
Battery Voltage: 3.02 V
Brady Statistic AP VP Percent: 0.38 %
Brady Statistic AP VS Percent: 56.65 %
Brady Statistic AS VP Percent: 0.03 %
Brady Statistic AS VS Percent: 42.94 %
Brady Statistic RA Percent Paced: 56.5 %
Brady Statistic RV Percent Paced: 0.41 %
Date Time Interrogation Session: 20230523233614
Implantable Lead Implant Date: 20200527
Implantable Lead Implant Date: 20200527
Implantable Lead Location: 753859
Implantable Lead Location: 753860
Implantable Lead Model: 5076
Implantable Lead Model: 5076
Implantable Pulse Generator Implant Date: 20200527
Lead Channel Impedance Value: 361 Ohm
Lead Channel Impedance Value: 437 Ohm
Lead Channel Impedance Value: 437 Ohm
Lead Channel Impedance Value: 589 Ohm
Lead Channel Pacing Threshold Amplitude: 0.5 V
Lead Channel Pacing Threshold Amplitude: 0.625 V
Lead Channel Pacing Threshold Pulse Width: 0.4 ms
Lead Channel Pacing Threshold Pulse Width: 0.4 ms
Lead Channel Sensing Intrinsic Amplitude: 3.875 mV
Lead Channel Sensing Intrinsic Amplitude: 3.875 mV
Lead Channel Sensing Intrinsic Amplitude: 9.75 mV
Lead Channel Sensing Intrinsic Amplitude: 9.75 mV
Lead Channel Setting Pacing Amplitude: 1.5 V
Lead Channel Setting Pacing Amplitude: 2.5 V
Lead Channel Setting Pacing Pulse Width: 0.4 ms
Lead Channel Setting Sensing Sensitivity: 2 mV

## 2021-11-07 ENCOUNTER — Other Ambulatory Visit: Payer: Self-pay | Admitting: Internal Medicine

## 2021-11-07 ENCOUNTER — Ambulatory Visit: Payer: Medicare Other | Admitting: Dermatology

## 2021-11-07 ENCOUNTER — Telehealth: Payer: Self-pay

## 2021-11-07 DIAGNOSIS — E039 Hypothyroidism, unspecified: Secondary | ICD-10-CM

## 2021-11-07 NOTE — Telephone Encounter (Signed)
Pt is requesting Refill for  thyroid (ARMOUR THYROID) 60 MG tablet  Pharmacy: [id number]- [place], [place] - [address]  I reached out to pharmacy and she confirmed the last time it was picked up was in Jan. And she doesn't have record of the Rx from 08/17/21.  Please advise

## 2021-11-08 ENCOUNTER — Ambulatory Visit (INDEPENDENT_AMBULATORY_CARE_PROVIDER_SITE_OTHER): Payer: Medicare Other | Admitting: Dermatology

## 2021-11-08 DIAGNOSIS — C44519 Basal cell carcinoma of skin of other part of trunk: Secondary | ICD-10-CM | POA: Diagnosis not present

## 2021-11-08 DIAGNOSIS — Z1283 Encounter for screening for malignant neoplasm of skin: Secondary | ICD-10-CM | POA: Diagnosis not present

## 2021-11-08 DIAGNOSIS — C44719 Basal cell carcinoma of skin of left lower limb, including hip: Secondary | ICD-10-CM | POA: Diagnosis not present

## 2021-11-08 DIAGNOSIS — L821 Other seborrheic keratosis: Secondary | ICD-10-CM | POA: Diagnosis not present

## 2021-11-08 DIAGNOSIS — Z85828 Personal history of other malignant neoplasm of skin: Secondary | ICD-10-CM

## 2021-11-08 DIAGNOSIS — D485 Neoplasm of uncertain behavior of skin: Secondary | ICD-10-CM | POA: Diagnosis not present

## 2021-11-08 DIAGNOSIS — I2510 Atherosclerotic heart disease of native coronary artery without angina pectoris: Secondary | ICD-10-CM | POA: Diagnosis not present

## 2021-11-08 DIAGNOSIS — L570 Actinic keratosis: Secondary | ICD-10-CM | POA: Diagnosis not present

## 2021-11-08 DIAGNOSIS — L851 Acquired keratosis [keratoderma] palmaris et plantaris: Secondary | ICD-10-CM

## 2021-11-08 NOTE — Progress Notes (Signed)
Remote pacemaker transmission.   

## 2021-11-08 NOTE — Patient Instructions (Signed)

## 2021-11-09 ENCOUNTER — Other Ambulatory Visit: Payer: Self-pay | Admitting: Internal Medicine

## 2021-11-09 DIAGNOSIS — E039 Hypothyroidism, unspecified: Secondary | ICD-10-CM

## 2021-11-09 MED ORDER — THYROID 60 MG PO TABS: 60.0000 mg | ORAL_TABLET | Freq: Every day | ORAL | 0 refills | Status: DC

## 2021-11-23 ENCOUNTER — Telehealth: Payer: Self-pay

## 2021-11-23 NOTE — Telephone Encounter (Signed)
Path to patient bcc x2 surgery tomorrow 8:15

## 2021-11-23 NOTE — Telephone Encounter (Signed)
-----   Message from Lavonna Monarch, MD sent at 11/11/2021  2:50 PM EDT ----- Schedule surgery with Dr. Darene Lamer

## 2021-11-24 ENCOUNTER — Ambulatory Visit (INDEPENDENT_AMBULATORY_CARE_PROVIDER_SITE_OTHER): Payer: Medicare Other | Admitting: Dermatology

## 2021-11-24 DIAGNOSIS — C44519 Basal cell carcinoma of skin of other part of trunk: Secondary | ICD-10-CM | POA: Diagnosis not present

## 2021-11-24 DIAGNOSIS — C4491 Basal cell carcinoma of skin, unspecified: Secondary | ICD-10-CM

## 2021-11-24 DIAGNOSIS — L570 Actinic keratosis: Secondary | ICD-10-CM

## 2021-11-24 DIAGNOSIS — C44719 Basal cell carcinoma of skin of left lower limb, including hip: Secondary | ICD-10-CM

## 2021-11-24 MED ORDER — MUPIROCIN 2 % EX OINT: 1.0000 | TOPICAL_OINTMENT | Freq: Every day | CUTANEOUS | 0 refills | Status: DC

## 2021-11-24 NOTE — Patient Instructions (Signed)

## 2021-11-25 ENCOUNTER — Other Ambulatory Visit: Payer: Self-pay | Admitting: Internal Medicine

## 2021-11-25 DIAGNOSIS — H6504 Acute serous otitis media, recurrent, right ear: Secondary | ICD-10-CM

## 2021-11-25 DIAGNOSIS — J4540 Moderate persistent asthma, uncomplicated: Secondary | ICD-10-CM

## 2021-11-25 DIAGNOSIS — H6981 Other specified disorders of Eustachian tube, right ear: Secondary | ICD-10-CM

## 2021-11-27 ENCOUNTER — Encounter: Payer: Self-pay | Admitting: Dermatology

## 2021-11-27 NOTE — Progress Notes (Signed)
Follow-Up Visit   Subjective  [name] Glass is a 81 y.o. male who presents for the following: Annual Exam (Here for annual skin exam. PCP saw some concerns on arms. Patient also wants left knee checked. X months. Does not heal. History of non mole skin cancer).  Annual skin exam, several spots with growth Location:  Duration:  Quality:  Associated Signs/Symptoms: Modifying Factors:  Severity:  Timing: Context:   Objective  Well appearing patient in no apparent distress; mood and affect are within normal limits. Legs plus waist up skin examination, no atypical pigmented lesions (all checked with dermoscopy).  3 possible nonmelanoma skin cancers will be biopsied.  Right Upper Back Focally eroded 8 mm pearly papule, BCC       Left Lower Leg - Anterior Centrally eroded 8 mm pearly papule       Chest - Medial (Center) Waxy 9 mm crust, rule out superficial squamous cell carcinoma       Left Forearm - Anterior, Right Forearm - Anterior Multiple small gritty pink crusts, 2 thicker lesions will be treated with liquid nitrogen freeze    A full examination was performed including scalp, head, eyes, ears, nose, lips, neck, chest, axillae, abdomen, back, buttocks, bilateral upper extremities, bilateral lower extremities, hands, feet, fingers, toes, fingernails, and toenails. All findings within normal limits unless otherwise noted below.  Areas beneath undergarments not fully examined   Assessment & Plan    Neoplasm of uncertain behavior of skin (3) Right Upper Back  Skin / nail biopsy Type of biopsy: tangential   Informed consent: discussed and consent obtained   Timeout: patient name, date of birth, surgical site, and procedure verified   Procedure prep:  Patient was prepped and draped in usual sterile fashion (Non sterile) Prep type:  Chlorhexidine Anesthesia: the lesion was anesthetized in a standard fashion   Anesthetic:  1% lidocaine w/ epinephrine  1-100,000 local infiltration Instrument used: flexible razor blade   Outcome: patient tolerated procedure well   Post-procedure details: wound care instructions given    Specimen 1 - Surgical pathology Differential Diagnosis: scc vs bcc  Check Margins: No  Left Lower Leg - Anterior  Skin / nail biopsy Type of biopsy: tangential   Informed consent: discussed and consent obtained   Timeout: patient name, date of birth, surgical site, and procedure verified   Procedure prep:  Patient was prepped and draped in usual sterile fashion (Non sterile) Prep type:  Chlorhexidine Anesthesia: the lesion was anesthetized in a standard fashion   Anesthetic:  1% lidocaine w/ epinephrine 1-100,000 local infiltration Instrument used: flexible razor blade   Outcome: patient tolerated procedure well   Post-procedure details: wound care instructions given    Specimen 2 - Surgical pathology Differential Diagnosis: scc vs bcc  Check Margins: No  Chest - Medial (Center)  Skin / nail biopsy Type of biopsy: tangential   Informed consent: discussed and consent obtained   Timeout: patient name, date of birth, surgical site, and procedure verified   Procedure prep:  Patient was prepped and draped in usual sterile fashion (Non sterile) Prep type:  Chlorhexidine Anesthesia: the lesion was anesthetized in a standard fashion   Anesthetic:  1% lidocaine w/ epinephrine 1-100,000 local infiltration Instrument used: flexible razor blade   Outcome: patient tolerated procedure well   Post-procedure details: wound care instructions given    Specimen 3 - Surgical pathology Differential Diagnosis: scc vs bcc  Check Margins: No  AK (actinic keratosis) (2) Left Forearm -  Anterior; Right Forearm - Anterior  Destruction of lesion - Left Forearm - Anterior, Right Forearm - Anterior Complexity: simple   Destruction method: cryotherapy   Informed consent: discussed and consent obtained   Timeout:  patient name,  date of birth, surgical site, and procedure verified Lesion destroyed using liquid nitrogen: Yes   Cryotherapy cycles:  5 Outcome: patient tolerated procedure well with no complications    Stucco keratosis Right Forearm - Anterior  Leave if stable  Encounter for screening for malignant neoplasm of skin  Annual skin examination      I, Janalyn Harder, MD, have reviewed all documentation for this visit.  The documentation on 11/27/21 for the exam, diagnosis, procedures, and orders are all accurate and complete.

## 2021-12-12 ENCOUNTER — Encounter: Payer: Self-pay | Admitting: Dermatology

## 2021-12-12 NOTE — Progress Notes (Signed)
   Follow-Up Visit   Subjective  [name] Glass is a 81 y.o. male who presents for the following: Procedure (Patient here today for treatment of BCC x 2 right upper back and left lower leg anterior).  2 biopsy proven basal cell carcinomas +1 new crust on leg Location:  Duration:  Quality:  Associated Signs/Symptoms: Modifying Factors:  Severity:  Timing: Context:   Objective  Well appearing patient in no apparent distress; mood and affect are within normal limits. Right Lower Leg - Anterior 4 mm pink hornlike crust  Left Lower Leg - Anterior Lesion identified by Dr.Vedha Tercero and nurse in room.    Right Upper Back Lesion identified by Dr.Halil Rentz and nurse in room.      All skin waist up examined.  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  Destruction of lesion Complexity: simple   Destruction method: electrodesiccation and curettage   Informed consent: discussed and consent obtained   Timeout:  patient name, date of birth, surgical site, and procedure verified Anesthesia: the lesion was anesthetized in a standard fashion   Anesthetic:  1% lidocaine w/ epinephrine 1-100,000 local infiltration Curettage performed in three different directions: Yes   Electrodesiccation performed over the curetted area: Yes   Curettage cycles:  3 Lesion length (cm):  2.5 Lesion width (cm):  2.5 Margin per side (cm):  0 Final wound size (cm):  2.5 Hemostasis achieved with:  aluminum chloride Outcome: patient  tolerated procedure well with no complications   Post-procedure details: wound care instructions given    Right Upper Back  Destruction of lesion Complexity: simple   Destruction method: electrodesiccation and curettage   Informed consent: discussed and consent obtained   Timeout:  patient name, date of birth, surgical site, and procedure verified Anesthesia: the lesion was anesthetized in a standard fashion   Anesthetic:  1% lidocaine w/ epinephrine 1-100,000 local infiltration Curettage performed in three different directions: Yes   Electrodesiccation performed over the curetted area: Yes   Curettage cycles:  3 Lesion length (cm):  2 Lesion width (cm):  2 Margin per side (cm):  0 Final wound size (cm):  2 Hemostasis achieved with:  aluminum chloride Outcome: patient tolerated procedure well with no complications   Post-procedure details: wound care instructions given        I, Lavonna Monarch, MD, have reviewed all documentation for this visit.  The documentation on 12/12/21 for the exam, diagnosis, procedures, and orders are all accurate and complete.

## 2021-12-14 ENCOUNTER — Telehealth: Payer: Self-pay

## 2021-12-14 DIAGNOSIS — K219 Gastro-esophageal reflux disease without esophagitis: Secondary | ICD-10-CM

## 2021-12-14 NOTE — Telephone Encounter (Signed)
Pt is requesting a refill on: acyclovir (ZOVIRAX) 200 MG capsule  Pharmacy: [id number]- [place], [place] - [address]  Pt is requesting refill Discontinued medication: Simethicone 40 MG/0.6ML LIQD   Pharmacy: [facility] LOV 08/17/21 ROV 01/17/22

## 2021-12-15 ENCOUNTER — Other Ambulatory Visit: Payer: Self-pay | Admitting: Internal Medicine

## 2021-12-15 MED ORDER — ACYCLOVIR 200 MG PO CAPS: ORAL_CAPSULE | ORAL | 0 refills | Status: DC

## 2021-12-21 ENCOUNTER — Other Ambulatory Visit: Payer: Self-pay | Admitting: Internal Medicine

## 2021-12-21 NOTE — Telephone Encounter (Signed)
Dusty with [facility] calling to check the status of Simethicone refill.

## 2021-12-23 MED ORDER — SIMETHICONE 40 MG/0.6ML PO LIQD: 40.0000 mg | Freq: Four times a day (QID) | ORAL | 3 refills | Status: DC

## 2021-12-23 NOTE — Telephone Encounter (Signed)
acyclovir already sent refilled the simethicone to [facility].Marland KitchenJohny Chess

## 2021-12-27 ENCOUNTER — Other Ambulatory Visit: Payer: Self-pay | Admitting: Internal Medicine

## 2021-12-28 ENCOUNTER — Other Ambulatory Visit: Payer: Self-pay | Admitting: Internal Medicine

## 2021-12-28 DIAGNOSIS — E291 Testicular hypofunction: Secondary | ICD-10-CM

## 2022-01-17 ENCOUNTER — Ambulatory Visit (INDEPENDENT_AMBULATORY_CARE_PROVIDER_SITE_OTHER): Payer: Medicare Other | Admitting: Internal Medicine

## 2022-01-17 ENCOUNTER — Encounter: Payer: Self-pay | Admitting: Internal Medicine

## 2022-01-17 VITALS — BP 140/74 | HR 61 | Temp 98.1°F | Resp 16 | Ht 72.0 in | Wt 205.0 lb

## 2022-01-17 DIAGNOSIS — M5030 Other cervical disc degeneration, unspecified cervical region: Secondary | ICD-10-CM

## 2022-01-17 DIAGNOSIS — Z23 Encounter for immunization: Secondary | ICD-10-CM

## 2022-01-17 DIAGNOSIS — N1831 Chronic kidney disease, stage 3a: Secondary | ICD-10-CM | POA: Diagnosis not present

## 2022-01-17 DIAGNOSIS — R972 Elevated prostate specific antigen [PSA]: Secondary | ICD-10-CM

## 2022-01-17 DIAGNOSIS — E785 Hyperlipidemia, unspecified: Secondary | ICD-10-CM

## 2022-01-17 DIAGNOSIS — I10 Essential (primary) hypertension: Secondary | ICD-10-CM | POA: Diagnosis not present

## 2022-01-17 DIAGNOSIS — M1711 Unilateral primary osteoarthritis, right knee: Secondary | ICD-10-CM | POA: Diagnosis not present

## 2022-01-17 DIAGNOSIS — N400 Enlarged prostate without lower urinary tract symptoms: Secondary | ICD-10-CM | POA: Diagnosis not present

## 2022-01-17 DIAGNOSIS — I2510 Atherosclerotic heart disease of native coronary artery without angina pectoris: Secondary | ICD-10-CM

## 2022-01-17 DIAGNOSIS — K219 Gastro-esophageal reflux disease without esophagitis: Secondary | ICD-10-CM

## 2022-01-17 DIAGNOSIS — E291 Testicular hypofunction: Secondary | ICD-10-CM | POA: Diagnosis not present

## 2022-01-17 DIAGNOSIS — E039 Hypothyroidism, unspecified: Secondary | ICD-10-CM

## 2022-01-17 MED ORDER — TRAMADOL HCL 50 MG PO TABS: 50.0000 mg | ORAL_TABLET | Freq: Three times a day (TID) | ORAL | 0 refills | Status: AC | PRN

## 2022-01-17 MED ORDER — LIOTHYRONINE SODIUM 25 MCG PO TABS: ORAL_TABLET | ORAL | 1 refills | Status: DC

## 2022-01-17 MED ORDER — HYDRALAZINE HCL 25 MG PO TABS: 25.0000 mg | ORAL_TABLET | Freq: Three times a day (TID) | ORAL | 0 refills | Status: DC

## 2022-01-17 MED ORDER — LIVALO 4 MG PO TABS: 1.0000 | ORAL_TABLET | Freq: Every day | ORAL | 1 refills | Status: DC

## 2022-01-17 MED ORDER — TAMSULOSIN HCL 0.4 MG PO CAPS: ORAL_CAPSULE | ORAL | 1 refills | Status: DC

## 2022-01-17 MED ORDER — EZETIMIBE 10 MG PO TABS: 10.0000 mg | ORAL_TABLET | Freq: Every day | ORAL | 1 refills | Status: DC

## 2022-01-17 MED ORDER — SIMETHICONE 40 MG/0.6ML PO LIQD: 40.0000 mg | Freq: Four times a day (QID) | ORAL | 3 refills | Status: DC

## 2022-01-17 MED ORDER — THYROID 60 MG PO TABS: 60.0000 mg | ORAL_TABLET | Freq: Every day | ORAL | 1 refills | Status: DC

## 2022-01-17 NOTE — Patient Instructions (Signed)
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
## 2022-01-18 LAB — CBC WITH DIFFERENTIAL/PLATELET
Basophils Absolute: 0.1 10*3/uL (ref 0.0–0.1)
Basophils Relative: 1.1 % (ref 0.0–3.0)
Eosinophils Absolute: 0.1 10*3/uL (ref 0.0–0.7)
Eosinophils Relative: 1.8 % (ref 0.0–5.0)
HCT: 47.1 % (ref 39.0–52.0)
Hemoglobin: 15.4 g/dL (ref 13.0–17.0)
Lymphocytes Relative: 29.7 % (ref 12.0–46.0)
Lymphs Abs: 2.1 10*3/uL (ref 0.7–4.0)
MCHC: 32.7 g/dL (ref 30.0–36.0)
MCV: 94.2 fl (ref 78.0–100.0)
Monocytes Absolute: 1.3 10*3/uL — ABNORMAL HIGH (ref 0.1–1.0)
Monocytes Relative: 17.4 % — ABNORMAL HIGH (ref 3.0–12.0)
Neutro Abs: 3.6 10*3/uL (ref 1.4–7.7)
Neutrophils Relative %: 50 % (ref 43.0–77.0)
Platelets: 252 10*3/uL (ref 150.0–400.0)
RBC: 5.01 Mil/uL (ref 4.22–5.81)
RDW: 17.3 % — ABNORMAL HIGH (ref 11.5–15.5)
WBC: 7.2 10*3/uL (ref 4.0–10.5)

## 2022-01-18 LAB — PSA: PSA: 5.4 ng/mL — ABNORMAL HIGH (ref 0.10–4.00)

## 2022-01-18 LAB — BASIC METABOLIC PANEL
BUN: 21 mg/dL (ref 6–23)
CO2: 26 mEq/L (ref 19–32)
Calcium: 9.8 mg/dL (ref 8.4–10.5)
Chloride: 104 mEq/L (ref 96–112)
Creatinine, Ser: 1.11 mg/dL (ref 0.40–1.50)
GFR: 62.26 mL/min (ref >60.00)
Glucose, Bld: 99 mg/dL (ref 70–99)
Potassium: 3.7 mEq/L (ref 3.5–5.1)
Sodium: 140 mEq/L (ref 135–145)

## 2022-01-18 LAB — TSH: TSH: 0.95 u[IU]/mL (ref 0.35–5.50)

## 2022-01-20 ENCOUNTER — Encounter: Payer: Self-pay | Admitting: Physical Medicine and Rehabilitation

## 2022-01-24 ENCOUNTER — Telehealth: Payer: Self-pay | Admitting: *Deleted

## 2022-01-24 NOTE — Telephone Encounter (Signed)
Referral sent to skin surgery center for follow up on leg-Basal cell. Patient said it is coming back.

## 2022-01-25 ENCOUNTER — Ambulatory Visit (INDEPENDENT_AMBULATORY_CARE_PROVIDER_SITE_OTHER): Payer: Medicare Other

## 2022-01-25 DIAGNOSIS — I495 Sick sinus syndrome: Secondary | ICD-10-CM | POA: Diagnosis not present

## 2022-01-26 LAB — CUP PACEART REMOTE DEVICE CHECK
Battery Remaining Longevity: 137 mo
Battery Voltage: 3.02 V
Brady Statistic AP VP Percent: 0.6 %
Brady Statistic AP VS Percent: 42.87 %
Brady Statistic AS VP Percent: 0.16 %
Brady Statistic AS VS Percent: 56.36 %
Brady Statistic RA Percent Paced: 43.31 %
Brady Statistic RV Percent Paced: 0.76 %
Date Time Interrogation Session: 20230822201116
Implantable Lead Implant Date: 20200527
Implantable Lead Implant Date: 20200527
Implantable Lead Location: 753859
Implantable Lead Location: 753860
Implantable Lead Model: 5076
Implantable Lead Model: 5076
Implantable Pulse Generator Implant Date: 20200527
Lead Channel Impedance Value: 380 Ohm
Lead Channel Impedance Value: 437 Ohm
Lead Channel Impedance Value: 437 Ohm
Lead Channel Impedance Value: 608 Ohm
Lead Channel Pacing Threshold Amplitude: 0.375 V
Lead Channel Pacing Threshold Amplitude: 0.625 V
Lead Channel Pacing Threshold Pulse Width: 0.4 ms
Lead Channel Pacing Threshold Pulse Width: 0.4 ms
Lead Channel Sensing Intrinsic Amplitude: 11.25 mV
Lead Channel Sensing Intrinsic Amplitude: 11.25 mV
Lead Channel Sensing Intrinsic Amplitude: 5.5 mV
Lead Channel Sensing Intrinsic Amplitude: 5.5 mV
Lead Channel Setting Pacing Amplitude: 1.5 V
Lead Channel Setting Pacing Amplitude: 2.5 V
Lead Channel Setting Pacing Pulse Width: 0.4 ms
Lead Channel Setting Sensing Sensitivity: 2 mV

## 2022-01-27 ENCOUNTER — Other Ambulatory Visit: Payer: Self-pay | Admitting: Pulmonary Disease

## 2022-02-08 ENCOUNTER — Other Ambulatory Visit: Payer: Self-pay | Admitting: Pulmonary Disease

## 2022-02-15 ENCOUNTER — Ambulatory Visit: Payer: Medicare Other | Admitting: Physical Medicine and Rehabilitation

## 2022-02-17 ENCOUNTER — Ambulatory Visit (HOSPITAL_COMMUNITY): Payer: Medicare Other | Attending: Cardiovascular Disease

## 2022-02-17 DIAGNOSIS — I350 Nonrheumatic aortic (valve) stenosis: Secondary | ICD-10-CM | POA: Diagnosis not present

## 2022-02-17 LAB — ECHOCARDIOGRAM COMPLETE
AR max vel: 1.53 cm2
AV Area VTI: 1.51 cm2
AV Area mean vel: 1.61 cm2
AV Mean grad: 19 mmHg
AV Peak grad: 38.2 mmHg
Ao pk vel: 3.09 m/s
Area-P 1/2: 2.24 cm2
S' Lateral: 3 cm

## 2022-02-21 NOTE — Progress Notes (Signed)
Remote pacemaker transmission.   

## 2022-02-23 DIAGNOSIS — C4359 Malignant melanoma of other part of trunk: Secondary | ICD-10-CM | POA: Diagnosis not present

## 2022-02-23 DIAGNOSIS — C44719 Basal cell carcinoma of skin of left lower limb, including hip: Secondary | ICD-10-CM | POA: Diagnosis not present

## 2022-02-23 DIAGNOSIS — C44529 Squamous cell carcinoma of skin of other part of trunk: Secondary | ICD-10-CM | POA: Diagnosis not present

## 2022-02-23 DIAGNOSIS — Z1283 Encounter for screening for malignant neoplasm of skin: Secondary | ICD-10-CM | POA: Diagnosis not present

## 2022-02-23 DIAGNOSIS — L821 Other seborrheic keratosis: Secondary | ICD-10-CM | POA: Diagnosis not present

## 2022-02-23 DIAGNOSIS — D485 Neoplasm of uncertain behavior of skin: Secondary | ICD-10-CM | POA: Diagnosis not present

## 2022-02-23 DIAGNOSIS — L570 Actinic keratosis: Secondary | ICD-10-CM | POA: Diagnosis not present

## 2022-02-27 ENCOUNTER — Encounter
Payer: Medicare Other | Attending: Physical Medicine and Rehabilitation | Admitting: Physical Medicine and Rehabilitation

## 2022-02-27 VITALS — BP 111/65 | HR 84 | Ht 72.0 in | Wt 200.0 lb

## 2022-02-27 DIAGNOSIS — M4003 Postural kyphosis, cervicothoracic region: Secondary | ICD-10-CM | POA: Insufficient documentation

## 2022-02-27 DIAGNOSIS — M5030 Other cervical disc degeneration, unspecified cervical region: Secondary | ICD-10-CM | POA: Diagnosis not present

## 2022-02-27 DIAGNOSIS — G44229 Chronic tension-type headache, not intractable: Secondary | ICD-10-CM | POA: Diagnosis not present

## 2022-02-27 DIAGNOSIS — M542 Cervicalgia: Secondary | ICD-10-CM | POA: Insufficient documentation

## 2022-02-27 NOTE — Assessment & Plan Note (Signed)
Recommend voltaren gel BID consistently until follow up.  PT script as above with rom, stretching, and myofascial release.  Continue TENS unit.  Follow up in 2 weeks for bilateral TPI into cervical paraspinals, b/l trapezius.

## 2022-02-27 NOTE — Patient Instructions (Addendum)
A referral has been sent to PT in [place] to work on neck stretching, posture and gait.  Please use voltaren gel 2x daily consistently on your neck and shoulders until follow up.  You may continue Tylenol and TENS unit for pain control, along with ice/heat modalities.  Follow up in 2 weeks for trigger point injections and bilateral occipital nerve blocks. If your pain resolves with PT alone, you can call and cancel this appointment.   Follow up 1 month after injections to monitor progress.

## 2022-02-27 NOTE — Assessment & Plan Note (Signed)
Headaches worsened with neck pain/stiffness and following GON distribution, however not reproducible on exam.  Will consider concurrent bilateral occipital nerve blocks as trial treatment when patient returns for TPI, based on improvements with PT and exam at that time.

## 2022-02-27 NOTE — Assessment & Plan Note (Addendum)
Severely limiting neck ROM and contributing to postural difficulty and pain.   PT script given for stretching, ROM, myofascial release and posture

## 2022-02-27 NOTE — Assessment & Plan Note (Addendum)
Independently reviewed CT neck 10/13/20; appreciate spondylosis along with multi-level decreased disc height and anterior fusion throughout lower cervical and upper thoracic spine.  If pain uncontrollable with non-invasive measures, can consider referral to Dr. Bonney Aid for nerve blocks.

## 2022-02-27 NOTE — Progress Notes (Signed)
Subjective:    Patient ID: [name] Glass, male    DOB: [birth year], 81 y.o.   MRN: [id number]  HPI  [name] Glass is a 81 y.o. 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   They are presenting to PM&R clinic as a new patient for pain management evaluation. They were referred by Dr. Scarlette Calico for treatment of pain in his shoulders/neck.   Source: "It starts in my shoulder and goes up into my neck, and when I turn my head it is sharp in the back of my head". Sometimes radiates into his eyes, but not often.  Inciting incident: None; thinks it's arthritis but has had no imaging.  Duration of pain: Low-constant, with sharp exacerbations with head turning that can last through a day.  Description of pain: Dull on the best days, stabbing/hot on bad days.  Severity: On average 2-3/10. At worst 9/10. At best 2/10. Exacerbating factors: Standing, walking, turning his head to the right, and hunched over activities packing bag, placing pills into pill counter.  Remitting factors: Sitting down and extending his neck.  Red flag symptoms: Patient denies saddle anesthesia, loss of bowel or bladder continence, new weakness, new numbness/tingling, or pain waking up at nighttime.  Medications tried: Topical medications ( mild effect) : Voltaren gel, uses on his "worst days" in AM and PM but doesn't like it because it's greasy, gets on clothing. Has not tried lidocaine patches. Has biofreeze but likes voltaren better.  Nsaids ( some effect): Does not use d/t being on blood thinner, has had Hx of  bleeding.  Tylenol  ( some effect): Takes 2x 650 mg in AM and 1000 mg QHS. Has not tried to stop it.  Opiates  ( some effect): Has tried, does not like and "I get a little goofy when I take it".  Gabapentin / Lyrica : Has never tried TCAs: Has never tried  SNRIs : has not tried Other: None  Other treatments: PT/OT : Never tried Accupuncture/chiropractor/massage: Chiropractor and massage, with benefit. Does not go to either regularly; states the chiropractor "wanted me to just keep coming back" without much benefit.  TENs unit ( very effect): Portable TENS unit at 2-3 cycles at 8/10.  Injections: Had in his neck 5-6 years ago 1x, not sure what kind but thinks it was effective.  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 Cherry Callas, but this pain is unchanged from before/after that.  Other  (some effect): Walks 0.5-1 mile per day in a hilly area for exercise. Lays on ice sometimes, sometimes tries heating pad.   Goals for pain control: "I'd like to go through my day without being in such pain.. It makes you grumpy, it makes you lose patience"  Pain Inventory Average Pain 5 Pain Right Now 2 My pain is constant, sharp, and stabbing  In the last 24 hours, has pain interfered with the following? General activity 7 Relation with others 4 Enjoyment of life 7 What TIME of day is your pain at its worst? evening Sleep (in general) Good  Pain is worse with: walking and standing Pain improves with: heat/ice and TENS Relief from  Meds:  na  how many minutes can you walk? 20 ability to climb steps?  yes do you drive?  yes  retired  No problems in this area  New pt  New pt    Family History  Problem Relation Age of Onset   Heart attack Father 46       lived to 67   Alzheimer's disease Mother    Stroke Mother    Breast cancer Mother    Alzheimer's disease Maternal Grandmother    Stroke Maternal Grandmother    Lung cancer Maternal  Grandmother    Lung cancer Maternal Grandfather    Cancer Paternal Grandmother        type unknown   Diabetes Paternal Grandfather    Other Paternal Grandfather        Growth in the back of the head   Social History   Socioeconomic History   Marital status: Divorced    Spouse name: Not on file   Number of children: 0   Years of education: Not on file   Highest education level: Not on file  Occupational History   Occupation: Retired   Tobacco Use   Smoking status: Never   Smokeless tobacco: Never  Vaping Use   Vaping Use: Never used  Substance and Sexual Activity   Alcohol use: Yes    Alcohol/week: 4.0 standard drinks of alcohol    Types: 4 Standard drinks or equivalent per week    Comment: 3 drinks at night    Drug use: No   Sexual activity: Not on file  Other Topics Concern   Not on file  Social History Narrative   Not on file   Social Determinants of Health   Financial Resource Strain: Low Risk  (04/25/2021)   Overall Financial Resource Strain (CARDIA)    Difficulty of Paying Living Expenses: Not hard at all  Food Insecurity: No Food Insecurity (04/25/2021)   Hunger Vital Sign    Worried About Running Out of Food in the Last Year: Never true    Ran Out of Food in the Last Year: Never true  Transportation Needs: No Transportation Needs (04/25/2021)   PRAPARE - Hydrologist (Medical): No    Lack of Transportation (Non-Medical): No  Physical Activity: Insufficiently Active (04/25/2021)   Exercise Vital Sign    Days of Exercise per Week: 3 days    Minutes of Exercise per Session: 30 min  Stress: No Stress Concern Present (04/25/2021)   [place]    Feeling of Stress : Not at all  Social Connections: Moderately Integrated (04/25/2021)   Social Connection and Isolation Panel [NHANES]    Frequency of Communication with Friends and Family: Twice a week    Frequency of  Social Gatherings with Friends and Family: Twice a week    Attends Religious Services: Never    Marine scientist or Organizations: Yes    Attends Archivist Meetings: 1 to 4 times per year    Marital Status: Married   Past Surgical History:  Procedure Laterality Date   CARDIAC CATHETERIZATION Right 2008   5 heart stents   CATARACT EXTRACTION Bilateral 6 years ago   CYSTOSCOPY N/A 08/06/2012   Procedure: CYSTOSCOPY;  Surgeon: [name], MD;  Location: [facility];  Service: Urology;  Laterality: N/A;   ESOPHAGOGASTRIC FUNDOPLICATION  7672   spleen removed, 5 pints of blood given   EYE SURGERY  HERNIA REPAIR  1985   x4   INGUINAL HERNIA REPAIR  1992   PACEMAKER IMPLANT N/A 10/30/2018   Procedure: PACEMAKER IMPLANT;  Surgeon: Constance Haw, MD;  Location: [facility];  Service: Cardiovascular;  Laterality: N/A;   PACEMAKER INSERTION     PPM GENERATOR REMOVAL N/A 10/25/2018   Procedure: PPM GENERATOR REMOVAL;  Surgeon: Constance Haw, MD;  Location: [facility];  Service: Cardiovascular;  Laterality: N/A;   PROSTATE ABLATION  2013   "laser ablation"   RETINAL DETACHMENT SURGERY Right 01/24/2019   Pneumatic cryopexy - Dr. Bernarda Caffey   TEE WITHOUT CARDIOVERSION N/A 10/25/2018   Procedure: TRANSESOPHAGEAL ECHOCARDIOGRAM (TEE);  Surgeon: Acie Fredrickson Wonda Cheng, MD;  Location: [facility];  Service: Cardiovascular;  Laterality: N/A;   TEMPORARY PACEMAKER N/A 10/25/2018   Procedure: TEMPORARY PACEMAKER;  Surgeon: Constance Haw, MD;  Location: [facility];  Service: Cardiovascular;  Laterality: N/A;   TONSILLECTOMY  as child   TOTAL KNEE ARTHROPLASTY Right 08/03/2014   Procedure: TOTAL RIGHT KNEE ARTHROPLASTY;  Surgeon: [name], MD;  Location: [facility];  Service: Orthopedics;  Laterality: Right;   TRANSURETHRAL RESECTION OF PROSTATE N/A 08/06/2012   Procedure: [place] Free OF PROSTATE WITH GYRUS ;  Surgeon: [name], MD;  Location: [facility];  Service: Urology;  Laterality: N/A;   UMBILICAL HERNIA REPAIR  1990   UVULECTOMY  2005   for sleep apnea   UVULOPALATOPHARYNGOPLASTY  6 years ago   Past Medical History:  Diagnosis Date   Arthritis    Basal cell carcinoma 02/17/2020   infl & ulcerated-Left forehead (MOHS)   Coronary artery disease    GERD (gastroesophageal reflux disease)    H/O bronchitis    H/O hiatal hernia    History of kidney stones last in 1976   Hyperlipidemia    statin intolerant, under control   Hypertension    under control   Hypertensive retinopathy    OU   Hypothyroidism    Moderate persistent asthma without complication 08/07/7423   Retinal detachment    OD   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   left helix tx cx3 90f   Sleep apnea    hx of, surgery to reverse   BP 111/65   Pulse 84   Ht 6' (1.829 m)   Wt 200 lb (90.7 kg)   SpO2 90%   BMI 27.12 kg/m   Opioid Risk Score:   Fall Risk Score:  `1  [facility]     04/25/2021    9:56 AM 04/25/2021    9:53 AM 02/10/2020    2:40 PM 12/22/2019   11:29 AM 01/02/2019   11:09 AM 06/27/2018    3:46 PM 06/26/2017    1:18 PM  Depression screen PHQ 2/9  Decreased Interest 0 0 0 0 0 0 0  Down, Depressed, Hopeless 0 0 0 0 0 0 0  PHQ - 2 Score 0 0 0 0 0 0 0     Review of Systems  Musculoskeletal:  Positive for neck pain.  All other systems reviewed and are negative.     Objective:   Physical Exam  Neurologic Exam:   DTRs: Reflexes were 2+ in bilateral UE and LEs. Babinsky: flexor responses b/l.   Hoffmans: negative b/l Sensory exam: revealed normal sensation in all dermatomal regions in bilateral UE and LEs.  Motor exam: strength normal in all myotomal regions of bilateral UE and LEs.  Coordination: Fine motor coordination was normal.   Gait: Gait was normal.  Balance: WNL  Neck/back exam: Inspection: Moderate kyphosis of upper thoracic and lower cervical spine, along with mild  dextroscoliosis. Palpation: Mildly ttp along bilateral cervical paraspinals, R>L trapezius. No reproduction of headaches on palpation medial to bilateral occipital arteries, although tender on R.   AROM: Neck severely limited in extension, R>L rotation, and bilateral sidebending. No limited movement in bilateral upper extremities.   Negative Spurling's bilaterally Negative Lhermitte's Negative facet loading bilaterally        Assessment & Plan:   MUNEEB VERAS is a 81 y.o. year old male  who  has a past medical history of Arthritis, Basal cell carcinoma (02/17/2020), Coronary artery disease, GERD (gastroesophageal reflux disease), H/O bronchitis, H/O hiatal hernia, History of kidney stones (last in 1976), Hyperlipidemia, Hypertension, Hypertensive retinopathy, Hypothyroidism, Moderate persistent asthma without complication (01/09/7543), Retinal detachment, SCCA (squamous cell carcinoma) of skin (04/18/2016), and Sleep apnea.   They are presenting to PM&R clinic as a referral by Dr. Scarlette Calico for treatment of pain in his shoulders/neck, which appears to be primarily myofascial with components of possible facet arthropathy and headaches ddx including tension type and occipital neuralgia.   Neck pain of over 3 months duration Assessment & Plan: Recommend voltaren gel BID consistently until follow up.  PT script as above with rom, stretching, and myofascial release.  Continue TENS unit.  Follow up in 2 weeks for bilateral TPI into cervical paraspinals, b/l trapezius.   Orders: -     Ambulatory referral to Physical Therapy  Chronic tension-type headache, not intractable Assessment & Plan: Headaches worsened with neck pain/stiffness and following GON distribution, however not reproducible on exam.  Will consider concurrent bilateral occipital nerve blocks as trial treatment when patient returns for TPI, based on improvements with PT and exam at that time.    Postural kyphosis of  cervicothoracic region Assessment & Plan: Severely limiting neck ROM and contributing to postural difficulty and pain.   PT script given for stretching, ROM, myofascial release and posture  Orders: -     Ambulatory referral to Physical Therapy  Myofascial neck pain Assessment & Plan: Recommend voltaren gel BID consistently until follow up.  PT script as above with rom, stretching, and myofascial release.  Continue TENS unit.  Follow up in 2 weeks for bilateral TPI into cervical paraspinals, b/l trapezius.   Orders: -     Ambulatory referral to Physical Therapy  Degenerative disc disease, cervical Assessment & Plan: Independently reviewed CT neck 10/13/20; appreciate spondylosis along with multi-level decreased disc height and anterior fusion throughout lower cervical and upper thoracic spine.  If pain uncontrollable with non-invasive measures, can consider referral to Dr. Bonney Aid for nerve blocks.       Gertie Gowda, DO 02/27/2022

## 2022-02-28 ENCOUNTER — Encounter: Payer: Self-pay | Admitting: Pulmonary Disease

## 2022-02-28 ENCOUNTER — Ambulatory Visit (INDEPENDENT_AMBULATORY_CARE_PROVIDER_SITE_OTHER): Payer: Medicare Other | Admitting: Pulmonary Disease

## 2022-02-28 VITALS — BP 116/64 | HR 63 | Ht 72.0 in | Wt 198.0 lb

## 2022-02-28 DIAGNOSIS — J310 Chronic rhinitis: Secondary | ICD-10-CM | POA: Diagnosis not present

## 2022-02-28 DIAGNOSIS — G4733 Obstructive sleep apnea (adult) (pediatric): Secondary | ICD-10-CM | POA: Diagnosis not present

## 2022-02-28 DIAGNOSIS — K219 Gastro-esophageal reflux disease without esophagitis: Secondary | ICD-10-CM

## 2022-02-28 DIAGNOSIS — J449 Chronic obstructive pulmonary disease, unspecified: Secondary | ICD-10-CM

## 2022-02-28 MED ORDER — ANORO ELLIPTA 62.5-25 MCG/ACT IN AEPB: INHALATION_SPRAY | RESPIRATORY_TRACT | 11 refills | Status: DC

## 2022-02-28 MED ORDER — IPRATROPIUM BROMIDE 0.03 % NA SOLN: 2.0000 | Freq: Three times a day (TID) | NASAL | 12 refills | Status: AC | PRN

## 2022-02-28 NOTE — Progress Notes (Signed)
Synopsis: Follow up for shortness of breath  Subjective:   PATIENT ID: [name] Glass: male DOB: 1940-06-30, MRN: [id number]  HPI  Chief Complaint  Patient presents with   Follow-up    CPAP f/u. States he has sleeping much better since using machine.    [name] Glass is an 81 year old male with coronary artery disease status post stent in 2020, sick sinus syndrome s/p pacemaker, hypertension, allergic rhinitis, GERD and obstructive sleep apnea not on CPAP who returns to pulmonary clinic for COPD follow up.  He was treated for pneumonia in the ED on 10/15/21.  He has been doing well since starting CPAP. He reports less fatigue and significant reduction in nocturia. He has noticed increased mask leak over recent weeks which is consistent in his compliance report.   He has 29/30 days used with an average time of 6 hours and 21 minutes used per night. AHI is 12.9/hr.   He reports on going issues with swallowing. He is bringing foam like substance which he feels is coming from his stomach. He especially notices this issue with carbonated beverages.   OV 06/02/21 He has been doing well on anoro ellipta inhaler since starting last fall which has significantly improved his exertional dyspnea. He reports he is able to walk longer periods of time without needing to stop.   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 He underwent two barium swallow evaluations which he eventually passed without noticeable signs of aspiration. He continues to report some difficulty with swallowing. He has lost weight as it is more cumbersome for him to focus on eating and swallowing properly  He is doing well on ipratropium nasal spray and fluticasone nasal spray for sinus congestion/drainage.   He has not received a CPAP machine that was previously ordered for him for the severe  sleep apnea.  OV 03/22/20 He was last seen in clinic on 01/20/20. He has had pulmonary function tests which show mild obstructive lung disease with mild degree of air trapping with otherwise normal diffusion capacity and total lung capacity. His home sleep study on 02/11/20 shows severe obstructive sleep apnea with an AHI of 49.4/hr.  He was started on ipratropium nasal spray for chronic sinus drainage with out much improvement in post-nasal drip symptoms and cough.      OV 01/20/20  He reports over the past year he has had progressive shortness of breath that is now limiting his daily activities and social activities. The shortness of breath is with exertion. 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 Echo on 10/25/2018 reviewed showing normal EF 55-60%. No diastolic dysfunction noted.    He has history of sinusitis and chronic rhinitis in which he takes fluticasone nasal spray. He has had sinus/septal surgery in the past. He complains of post-nasal drip and runny nose. He does not note sinus congestion. He also has history of moderate severe obstructive sleep apnea with an AHI of 37 based on a sleep study from 05/30/2005. He had uvula surgery to aid in the severity of the sleep apnea as he did not tolerate the CPAP mask. He does not report significant improvement in his sleep since the surgery but it has cut down on his snoring some.  He does report cough, more so in the morning with production of yellowish sputum. After he clears his chest in the morning he does not report having cough or wheezing throughout the day. He was placed on nebulizer treatments in the past which did not provide any relief.       Chest radiograph 06/09/2019 reviewed: signs of hyperinflation with flattened diaphragms and increased retrosternal airspace.    CTA Chest 10/22/2018  reviewed: lung parenchyma appears normal as well as the airways. The trachea has a slight saber sheath appearance.  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   left helix tx cx3 [id number]   Sleep apnea    hx of, surgery to reverse     Family History  Problem Relation Age of Onset   Heart attack Father 67      lived to 136  Alzheimer's disease Mother    Stroke Mother    Breast cancer Mother    Alzheimer's disease Maternal Grandmother    Stroke Maternal Grandmother    Lung cancer Maternal Grandmother    Lung cancer Maternal Grandfather    Cancer Paternal Grandmother        type unknown   Diabetes Paternal Grandfather    Other Paternal Grandfather        Growth in the back of the head     Social History   Socioeconomic History   Marital status: Divorced    Spouse name: Not on file   Number of children: 0   Years of education: Not on file   Highest education level: Not on file  Occupational History   Occupation: Retired   Tobacco Use   Smoking status: Never   Smokeless tobacco: Never  Vaping Use   Vaping Use: Never used  Substance and Sexual Activity   Alcohol use: Yes    Alcohol/week: 4.0 standard drinks of alcohol    Types: 4 Standard drinks or equivalent per week    Comment: 3 drinks at night    Drug use: No   Sexual activity: Not on file  Other Topics Concern   Not on file  Social History Narrative   Not on file   Social Determinants of Health    Financial Resource Strain: Low Risk  (04/25/2021)   Overall Financial Resource Strain (CARDIA)    Difficulty of Paying Living Expenses: Not hard at all  Food Insecurity: No Food Insecurity (04/25/2021)   Hunger Vital Sign    Worried About Running Out of Food in the Last Year: Never true    Ran Out of Food in the Last Year: Never true  Transportation Needs: No Transportation Needs (04/25/2021)   PRAPARE - THydrologist(Medical): No    Lack of Transportation (Non-Medical): No  Physical Activity: Insufficiently Active (04/25/2021)   Exercise Vital Sign    Days of Exercise per Week:  3 days    Minutes of Exercise per Session: 30 min  Stress: No Stress Concern Present (04/25/2021)   [place]    Feeling of Stress : Not at all  Social Connections: Moderately Integrated (04/25/2021)   Social Connection and Isolation Panel [NHANES]    Frequency of Communication with Friends and Family: Twice a week    Frequency of Social Gatherings with Friends and Family: Twice a week    Attends Religious Services: Never    Marine scientist or Organizations: Yes    Attends Archivist Meetings: 1 to 4 times per year    Marital Status: Married  Human resources officer Violence: Not At Risk (04/25/2021)   Humiliation, Afraid, Rape, and Kick questionnaire    Fear of Current or Ex-Partner: No    Emotionally Abused: No    Physically Abused: No    Sexually Abused: No     Allergies  Allergen Reactions   Morphine And Related     Had "crazy dreams" felt "crazy" per pt.   Bactrim [Sulfamethoxazole-Trimethoprim]    Statins    Sulfamethoxazole    Trimethoprim      Outpatient Medications Prior to Visit  Medication Sig Dispense Refill   acyclovir (ZOVIRAX) 200 MG capsule TAKE 1 CAPSULE BY MOUTH THREE TIMES A DAY 270 capsule 0   anastrozole (ARIMIDEX) 1 MG tablet Take 1 tablet (1 mg total) by mouth 2  (two) times a week. 24 tablet 1   Cholecalciferol (VITAMIN D-3) 125 MCG (5000 UT) TABS Take 125 mcg by mouth daily.     diclofenac Sodium (VOLTAREN) 1 % GEL Apply 2 g topically 4 (four) times daily.     ezetimibe (ZETIA) 10 MG tablet Take 1 tablet (10 mg total) by mouth daily. 90 tablet 1   finasteride (PROSCAR) 5 MG tablet TAKE 1 TABLET BY MOUTH EVERY DAY 90 tablet 1   fluticasone (FLONASE) 50 MCG/ACT nasal spray SPRAY 2 SPRAYS INTO EACH NOSTRIL EVERY DAY 48 mL 1   hydrALAZINE (APRESOLINE) 25 MG tablet Take 1 tablet (25 mg total) by mouth 3 (three) times daily. 270 tablet 0   levocetirizine (XYZAL) 5 MG tablet TAKE 1 TABLET BY MOUTH EVERY DAY IN THE EVENING 90 tablet 1   liothyronine (CYTOMEL) 25 MCG tablet TAKE 1 TABLET BY MOUTH EVERY DAY IN THE MORNING 90 tablet 1   montelukast (SINGULAIR) 10 MG tablet TAKE 1 TABLET BY MOUTH EVERYDAY AT BEDTIME 90 tablet 1   Nandrolone Decanoate 200 MG/ML OIL Inject 100 mg as directed once a week. 10 mL 1   omeprazole (PRILOSEC) 20 MG capsule Take 20 mg by mouth daily.     Pitavastatin Calcium (LIVALO) 4 MG TABS Take 1 tablet (4 mg total) by mouth daily. 90 tablet 1   Simethicone 40 MG/0.6ML LIQD Take 0.6 mLs (40 mg total) by mouth 4 (four) times daily. 300 mL 3   Somatropin (OMNITROPE) 5.8 MG SOLR ADD 1.14 ML DILUENT PER VIAL AND INJECT 0.15ML SUBCUTANEOUSLY MONDAY THROUGH FRIDAY AT BEDTIME. REFRIGERATE DO NOT FREEZE. 10 each 0   SYRINGE-NEEDLE, DISP, 3 ML 23G X 1" 3 ML MISC Use to inject testosterone as prescribed. 50 each 0   tamsulosin (FLOMAX) 0.4 MG CAPS capsule TAKE 1 CAPSULE BY MOUTH EVERY DAY IN THE MORNING 90 capsule 1   testosterone cypionate (DEPOTESTOSTERONE CYPIONATE) 200 MG/ML injection INJECT 1.25 MLS (250 MG TOTAL) INTO THE MUSCLE EVERY 7 (SEVEN) DAYS. 10 mL 0  thyroid (ARMOUR THYROID) 60 MG tablet Take 1 tablet (60 mg total) by mouth daily before breakfast. 90 tablet 1   ipratropium (ATROVENT) 0.03 % nasal spray Place 2 sprays into both  nostrils 3 (three) times daily as needed for rhinitis. 30 mL 12   umeclidinium-vilanterol (ANORO ELLIPTA) 62.5-25 MCG/ACT AEPB TAKE 1 PUFF BY MOUTH EVERY DAY 60 each 11   mupirocin ointment (BACTROBAN) 2 % Apply 1 Application topically daily. 22 g 0   Potassium Gluconate 2.5 MEQ TABS Take by mouth.     No facility-administered medications prior to visit.   Review of Systems  Constitutional:  Negative for chills, diaphoresis, fever, malaise/fatigue and weight loss.  HENT:  Negative for congestion, sinus pain and sore throat.   Eyes:  Negative for blurred vision.  Respiratory:  Negative for cough, hemoptysis, sputum production, shortness of breath and wheezing.   Cardiovascular:  Negative for chest pain, palpitations, orthopnea, claudication, leg swelling and PND.  Gastrointestinal:  Negative for abdominal pain, diarrhea, heartburn, nausea and vomiting.  Genitourinary:  Negative for dysuria and hematuria.  Musculoskeletal:  Negative for joint pain and myalgias.  Neurological:  Negative for dizziness, weakness and headaches.  Endo/Heme/Allergies:  Does not bruise/bleed easily.  Psychiatric/Behavioral: Negative.      Objective:   Vitals:   02/28/22 1613  BP: 116/64  Pulse: 63  SpO2: 97%  Weight: 198 lb (89.8 kg)  Height: 6' (1.829 m)   Physical Exam Constitutional:      General: He is not in acute distress.    Appearance: Normal appearance. He is not ill-appearing.  HENT:     Head: Normocephalic and atraumatic.  Eyes:     General: No scleral icterus.    Conjunctiva/sclera: Conjunctivae normal.  Cardiovascular:     Rate and Rhythm: Normal rate and regular rhythm.     Pulses: Normal pulses.     Heart sounds: Normal heart sounds. No murmur heard. Pulmonary:     Effort: Pulmonary effort is normal.     Breath sounds: Normal breath sounds.  Musculoskeletal:     Right lower leg: No edema.     Left lower leg: No edema.  Neurological:     General: No focal deficit present.      Mental Status: He is alert and oriented to person, place, and time. Mental status is at baseline.     Gait: Gait normal.  Psychiatric:        Mood and Affect: Mood normal.        Behavior: Behavior normal.        Thought Content: Thought content normal.        Judgment: Judgment normal.    CBC    Component Value Date/Time   WBC 7.2 01/17/2022 1633   RBC 5.01 01/17/2022 1633   HGB 15.4 01/17/2022 1633   HCT 47.1 01/17/2022 1633   PLT 252.0 01/17/2022 1633   MCV 94.2 01/17/2022 1633   MCH 32.8 10/15/2021 0905   MCHC 32.7 01/17/2022 1633   RDW 17.3 (H) 01/17/2022 1633   LYMPHSABS 2.1 01/17/2022 1633   MONOABS 1.3 (H) 01/17/2022 1633   EOSABS 0.1 01/17/2022 1633   BASOSABS 0.1 01/17/2022 1633      Latest Ref Rng & Units 01/17/2022    4:33 PM 10/15/2021    9:05 AM 08/10/2021   10:48 AM  BMP  Glucose 70 - 99 mg/dL 99  107  77   BUN 6 - 23 mg/dL 21  17  16  Creatinine 0.40 - 1.50 mg/dL 1.11  1.13  1.02   Sodium 135 - 145 mEq/L 140  141  143   Potassium 3.5 - 5.1 mEq/L 3.7  4.1  4.4   Chloride 96 - 112 mEq/L 104  107  105   CO2 19 - 32 mEq/L '26  27  26   '$ Calcium 8.4 - 10.5 mg/dL 9.8  9.6  10.0    Chest imaging: Chest radiograph 06/09/2019 reviewed: signs of hyperinflation with flattened diaphragms and increased retrosternal airspace.    CTA Chest 10/22/2018 reviewed: lung parenchyma appears normal as well as the airways. The trachea has a slight saber sheath appearance.  PFT: FEV1/FVC Pre 67 FEV1/FVC Post 72 FEV1 Post 2.18L (68%) FVC Post 3.03L (68%) TLC 89% RV 124% DLCO 87%  Labs: Reviewed, as above  Echo: 10/25/2018 reviewed showing normal EF 55-60%. No diastolic dysfunction noted.   Sleep Study 02/11/20 Severe obstructive sleep apnea/hypopnea syndrome AHI 49/4/hr, O2 nadir of 78% Suggest CPAP titration study or start auto-pap   Assessment & Plan:   Chronic obstructive pulmonary disease, unspecified COPD type ([place]) - Plan: umeclidinium-vilanterol (ANORO ELLIPTA)  62.5-25 MCG/ACT AEPB  Obstructive sleep apnea syndrome, severe - Plan: Ambulatory Referral for DME  Gastroesophageal reflux disease without esophagitis - Plan: Ambulatory referral to Gastroenterology  Chronic rhinitis - Plan: ipratropium (ATROVENT) 0.03 % nasal spray  Discussion: Windsor Goeken is an 81 year old male with coronary artery disease status post stent in 2020, sick sinus syndrome s/p pacemaker, hypertension, allergic rhinitis, GERD and obstructive sleep apnea not on CPAP who returns to pulmonary clinic for COPD follow up.   He is to continue anoro ellipta daily for his COPD.   He is to continue ipratropium nasal spray and fluticasone nasal spray for sinus congestion and post-nasal drainage.   He is to continue CPAP nightly. We will place order for a new mask incase his current mask has developed a defect that has led to higher leak.   We will refer him to GI for consideration of endoscopy given his complaints of refluxing foam like contents.   He will be reaching out to Dr. Wurtland Callas regarding his issues with swallowing prior to being evaluated by speech therapy again.   Follow up in 1 year.   Freda Jackson, MD [facility] Office: [phone]    Current Outpatient Medications:    acyclovir (ZOVIRAX) 200 MG capsule, TAKE 1 CAPSULE BY MOUTH THREE TIMES A DAY, Disp: 270 capsule, Rfl: 0   anastrozole (ARIMIDEX) 1 MG tablet, Take 1 tablet (1 mg total) by mouth 2 (two) times a week., Disp: 24 tablet, Rfl: 1   Cholecalciferol (VITAMIN D-3) 125 MCG (5000 UT) TABS, Take 125 mcg by mouth daily., Disp: , Rfl:    diclofenac Sodium (VOLTAREN) 1 % GEL, Apply 2 g topically 4 (four) times daily., Disp: , Rfl:    ezetimibe (ZETIA) 10 MG tablet, Take 1 tablet (10 mg total) by mouth daily., Disp: 90 tablet, Rfl: 1   finasteride (PROSCAR) 5 MG tablet, TAKE 1 TABLET BY MOUTH EVERY DAY, Disp: 90 tablet, Rfl: 1   fluticasone (FLONASE) 50 MCG/ACT nasal spray, SPRAY 2  SPRAYS INTO EACH NOSTRIL EVERY DAY, Disp: 48 mL, Rfl: 1   hydrALAZINE (APRESOLINE) 25 MG tablet, Take 1 tablet (25 mg total) by mouth 3 (three) times daily., Disp: 270 tablet, Rfl: 0   levocetirizine (XYZAL) 5 MG tablet, TAKE 1 TABLET BY MOUTH EVERY DAY IN THE EVENING, Disp: 90 tablet,  Rfl: 1   liothyronine (CYTOMEL) 25 MCG tablet, TAKE 1 TABLET BY MOUTH EVERY DAY IN THE MORNING, Disp: 90 tablet, Rfl: 1   montelukast (SINGULAIR) 10 MG tablet, TAKE 1 TABLET BY MOUTH EVERYDAY AT BEDTIME, Disp: 90 tablet, Rfl: 1   Nandrolone Decanoate 200 MG/ML OIL, Inject 100 mg as directed once a week., Disp: 10 mL, Rfl: 1   omeprazole (PRILOSEC) 20 MG capsule, Take 20 mg by mouth daily., Disp: , Rfl:    Pitavastatin Calcium (LIVALO) 4 MG TABS, Take 1 tablet (4 mg total) by mouth daily., Disp: 90 tablet, Rfl: 1   Simethicone 40 MG/0.6ML LIQD, Take 0.6 mLs (40 mg total) by mouth 4 (four) times daily., Disp: 300 mL, Rfl: 3   Somatropin (OMNITROPE) 5.8 MG SOLR, ADD 1.14 ML DILUENT PER VIAL AND INJECT 0.15ML SUBCUTANEOUSLY MONDAY THROUGH FRIDAY AT BEDTIME. REFRIGERATE DO NOT FREEZE., Disp: 10 each, Rfl: 0   SYRINGE-NEEDLE, DISP, 3 ML 23G X 1" 3 ML MISC, Use to inject testosterone as prescribed., Disp: 50 each, Rfl: 0   tamsulosin (FLOMAX) 0.4 MG CAPS capsule, TAKE 1 CAPSULE BY MOUTH EVERY DAY IN THE MORNING, Disp: 90 capsule, Rfl: 1   testosterone cypionate (DEPOTESTOSTERONE CYPIONATE) 200 MG/ML injection, INJECT 1.25 MLS (250 MG TOTAL) INTO THE MUSCLE EVERY 7 (SEVEN) DAYS., Disp: 10 mL, Rfl: 0   thyroid (ARMOUR THYROID) 60 MG tablet, Take 1 tablet (60 mg total) by mouth daily before breakfast., Disp: 90 tablet, Rfl: 1   ipratropium (ATROVENT) 0.03 % nasal spray, Place 2 sprays into both nostrils 3 (three) times daily as needed for rhinitis., Disp: 30 mL, Rfl: 12   umeclidinium-vilanterol (ANORO ELLIPTA) 62.5-25 MCG/ACT AEPB, TAKE 1 PUFF BY MOUTH EVERY DAY, Disp: 60 each, Rfl: 11

## 2022-02-28 NOTE — Patient Instructions (Addendum)
Continue anoro ellipta 1 puff daily  Continue CPAP use at night when sleeping   We will order you a new mask to see if that helps with the mask leak first  We have placed a referral to Gastroenterology for further evaluation and need for endoscopy  Dr. Hinda Kehr number Appointments: [phone]  Follow up in 1 year

## 2022-03-06 ENCOUNTER — Other Ambulatory Visit: Payer: Self-pay | Admitting: Internal Medicine

## 2022-03-13 ENCOUNTER — Ambulatory Visit: Payer: Medicare Other | Admitting: Physical Medicine and Rehabilitation

## 2022-03-19 ENCOUNTER — Other Ambulatory Visit: Payer: Self-pay | Admitting: Internal Medicine

## 2022-03-19 DIAGNOSIS — E291 Testicular hypofunction: Secondary | ICD-10-CM

## 2022-03-20 ENCOUNTER — Encounter
Payer: Medicare Other | Attending: Physical Medicine and Rehabilitation | Admitting: Physical Medicine and Rehabilitation

## 2022-03-20 VITALS — BP 134/61 | HR 74 | Ht 72.0 in | Wt 202.0 lb

## 2022-03-20 DIAGNOSIS — M542 Cervicalgia: Secondary | ICD-10-CM

## 2022-03-20 MED ORDER — LIDOCAINE HCL 1 % IJ SOLN
3.0000 mL | Freq: Once | INTRAMUSCULAR | Status: AC
  Administered 2022-03-20: 3 mL

## 2022-03-20 MED ORDER — TRIAMCINOLONE ACETONIDE 40 MG/ML IJ SUSP
0.4000 mg | Freq: Once | INTRAMUSCULAR | Status: AC
  Administered 2022-03-20: 0.4 mg via INTRAMUSCULAR

## 2022-03-20 NOTE — Progress Notes (Signed)
HPI: [name] Glass is a 81 y.o. year old male  who  has a past medical history of Arthritis, Basal cell carcinoma (02/17/2020), Coronary artery disease, GERD (gastroesophageal reflux disease), H/O bronchitis, H/O hiatal hernia, History of kidney stones (last in 1976), Hyperlipidemia, Hypertension, Hypertensive retinopathy, Hypothyroidism, Moderate persistent asthma without complication (01/05/9936), Retinal detachment, SCCA (squamous cell carcinoma) of skin (04/18/2016), and Sleep apnea presenting to PM&R clinic for TPI as part of treatment of pain in his shoulders/neck.   Per last visit, pain appears to be primarily myofascial with components of possible facet arthropathy and headaches ddx including tension type and occipital neuralgia.     Physical Exam:  General: Appropriate appearance for age.  Mental Status: Appropriate mood and affect.  Cardiovascular: RRR, no m/r/g.  Respiratory: CTAB, no rales/rhonchi/wheezing.  Skin: No apparent rashes or lesions.  Neuro: Awake, alert, and oriented x3. No apparent deficits.  MSK: Moving all 4 limbs antigravity and against resistance.   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 Consent uploaded to chart.  Benefits discussed.  Risks discussed included, but were not limited to, pain and discomfort, bleeding, bruising, allergic reaction, infection. All questions answered to patient/family member/guardian/ caregiver satisfaction. They would like to proceed with procedure. There are no noted contraindications to procedure.  PROCEDURE Time out was preformed No heat sources No antibiotics  The patient was explained about both the benefits and risks of a _ injection.  After the patient acknowledged an understanding of the risks and benefits, the patient agreed to proceed. The area was first marked and then prepped in an aseptic fashion with alcohol. A 27 g, 1/2 inch needle was directed into the identified trigger points listed below. The injection was completed with Kenalog 0.4 mg mixed with 3cc of 1% lidocaine after no blood was aspirated on pull back.  TPI to bilateral cervical paraspinals, b/l levator scapulae, and b/l infraspinatus  The pt tolerated the procedure well.  Preprocedural pain was 5 /10 Postprocedural pain was 1/69 No complications were encountered.   Impression: [name] Glass is a 81 y.o. year old male  who  has a past medical history of Arthritis, Basal cell carcinoma (02/17/2020), Coronary artery disease, GERD (gastroesophageal reflux disease), H/O bronchitis, H/O hiatal hernia, History of kidney stones (last in 1976), Hyperlipidemia, Hypertension, Hypertensive retinopathy, Hypothyroidism, Moderate persistent asthma without complication (11/09/8936), Retinal detachment, SCCA (squamous cell carcinoma) of skin (04/18/2016), and Sleep apnea presenting to PM&R clinic for TPI as part of treatment of pain in his shoulders/neck.   PLAN: - Resume Usual Activities. Notify Physician of any unusual bleeding, erythema or concern for side effects as reviewed above. - Apply ice prn for pain - Tylenol prn for pain - Follow up in 2-3 weeks to assess response to injection. IF no improvement in headaches/neck pain, may consider bilateral occipital nerve blocks at that time.   Patient/Care Justus Memory was ready to learn without apparent learning barriers. Education was provided on diagnosis, treatment options/plan according to patient's preferred learning style. Patient/Care Giver verbalized understanding and agreement with the above plan.   Gertie Gowda, DO 03/20/2022

## 2022-03-20 NOTE — Patient Instructions (Addendum)
-   Resume Usual Activities. Notify Physician of any unusual bleeding, erythema or concern for side effects as reviewed above. - Apply ice prn for pain - Tylenol prn for pain

## 2022-03-22 DIAGNOSIS — C4359 Malignant melanoma of other part of trunk: Secondary | ICD-10-CM | POA: Diagnosis not present

## 2022-03-22 DIAGNOSIS — L988 Other specified disorders of the skin and subcutaneous tissue: Secondary | ICD-10-CM | POA: Diagnosis not present

## 2022-03-22 DIAGNOSIS — Z85820 Personal history of malignant melanoma of skin: Secondary | ICD-10-CM | POA: Diagnosis not present

## 2022-03-29 ENCOUNTER — Ambulatory Visit: Payer: Medicare Other | Admitting: Pulmonary Disease

## 2022-04-07 DIAGNOSIS — Z961 Presence of intraocular lens: Secondary | ICD-10-CM | POA: Diagnosis not present

## 2022-04-07 DIAGNOSIS — H35371 Puckering of macula, right eye: Secondary | ICD-10-CM | POA: Diagnosis not present

## 2022-04-07 DIAGNOSIS — H3321 Serous retinal detachment, right eye: Secondary | ICD-10-CM | POA: Diagnosis not present

## 2022-04-07 DIAGNOSIS — H3521 Other non-diabetic proliferative retinopathy, right eye: Secondary | ICD-10-CM | POA: Diagnosis not present

## 2022-04-10 ENCOUNTER — Ambulatory Visit: Payer: Medicare Other | Attending: Physical Medicine and Rehabilitation | Admitting: Physical Therapy

## 2022-04-10 ENCOUNTER — Other Ambulatory Visit: Payer: Self-pay

## 2022-04-10 DIAGNOSIS — M4003 Postural kyphosis, cervicothoracic region: Secondary | ICD-10-CM | POA: Diagnosis not present

## 2022-04-10 DIAGNOSIS — M542 Cervicalgia: Secondary | ICD-10-CM | POA: Diagnosis not present

## 2022-04-10 DIAGNOSIS — R293 Abnormal posture: Secondary | ICD-10-CM | POA: Insufficient documentation

## 2022-04-10 NOTE — Therapy (Signed)
OUTPATIENT PHYSICAL THERAPY CERVICAL EVALUATION   Patient Name: [name] Glass MRN: [id number] DOB:06-Jan-1941, 81 y.o., male Today's Date: 04/10/2022   PT End of Session - 04/10/22 1206     Visit Number 1    Number of Visits 12    Date for PT Re-Evaluation 07/09/22    Authorization Type FOTO AT LEAST EVERY 5TH VISIT.  PROGRESS NOTE AT 10TH VISIT.  KX MODIFIER AFTER 15 VISITS.    PT Start Time 1109    PT Stop Time 1142    PT Time Calculation (min) 33 min    Activity Tolerance Patient tolerated treatment well    Behavior During Therapy WFL for tasks assessed/performed             Past Medical History:  Diagnosis Date   Arthritis    Basal cell carcinoma 02/17/2020   infl & ulcerated-Left forehead ([facility])   Coronary artery disease    GERD (gastroesophageal reflux disease)    H/O bronchitis    H/O hiatal hernia    History of kidney stones last in 1976   Hyperlipidemia    statin intolerant, under control   Hypertension    under control   Hypertensive retinopathy    OU   Hypothyroidism    Moderate persistent asthma without complication 11/04/9507   Retinal detachment    OD   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   left helix tx cx3 48f   Sleep apnea    hx of, surgery to reverse   Past Surgical History:  Procedure Laterality Date   CARDIAC CATHETERIZATION Right 2008   5 heart stents   CATARACT EXTRACTION Bilateral 6 years ago   CYSTOSCOPY N/A 08/06/2012   Procedure: CYSTOSCOPY;  Surgeon: JMalka So MD;  Location: [facility];  Service: Urology;  Laterality: N/A;   ESOPHAGOGASTRIC FUNDOPLICATION  13267  spleen removed, 5 pints of blood given   EYE SURGERY     HERNIA REPAIR  1985   x4   INGUINAL HERNIA REPAIR  1992   PACEMAKER IMPLANT N/A 10/30/2018   Procedure: PACEMAKER IMPLANT;  Surgeon: CConstance Haw MD;  [facility];  Service: Cardiovascular;  Laterality: N/A;   PACEMAKER INSERTION     PPM GENERATOR REMOVAL N/A 10/25/2018    Procedure: PPM GENERATOR REMOVAL;  Surgeon: CConstance Haw MD;  [facility];  Service: Cardiovascular;  Laterality: N/A;   PROSTATE ABLATION  2013   "laser ablation"   RETINAL DETACHMENT SURGERY Right 01/24/2019   Pneumatic cryopexy - Dr. BBernarda Caffey  TEE WITHOUT CARDIOVERSION N/A 10/25/2018   Procedure: TRANSESOPHAGEAL ECHOCARDIOGRAM (TEE);  Surgeon: NAcie FredricksonPWonda Cheng MD;  [facility];  Service: Cardiovascular;  Laterality: N/A;   TEMPORARY PACEMAKER N/A 10/25/2018   Procedure: TEMPORARY PACEMAKER;  Surgeon: CConstance Haw MD;  [facility];  Service: Cardiovascular;  Laterality: N/A;   TONSILLECTOMY  as child   TOTAL KNEE ARTHROPLASTY Right 08/03/2014   Procedure: TOTAL RIGHT KNEE ARTHROPLASTY;  Surgeon: FGearlean Alf MD;  Location: [facility];  Service: Orthopedics;  Laterality: Right;   TRANSURETHRAL RESECTION OF PROSTATE N/A 08/06/2012   Procedure: FAlmyra FreeOF PROSTATE WITH GYRUS ;  Surgeon: JMalka So MD;  Location: [facility];  Service: Urology;  Laterality: N/A;   UMBILICAL HERNIA REPAIR  1990   UVULECTOMY  2005   for sleep apnea   UVULOPALATOPHARYNGOPLASTY  6 years ago   Patient Active Problem List  Diagnosis Date Noted   Chronic tension-type headache, not intractable 02/27/2022   Postural kyphosis of cervicothoracic region 02/27/2022   Moderate aortic stenosis 10/27/2020   Thoracic aortic aneurysm ([place]) 03/12/2020   Sinus node dysfunction ([place]) 02/17/2020   Moderate persistent asthma without complication 47/02/6282   Chronic bronchitis, mucopurulent ([place]) 06/09/2019   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   Myofascial neck pain 04/01/2019   Degenerative disc disease, cervical 04/01/2019   Hypothyroidism    Presence of permanent cardiac pacemaker 10/23/2018   Sleep apnea 10/23/2018   PSA elevation 06/26/2017   Benign prostatic hyperplasia without lower urinary tract symptoms 05/21/2017   Seasonal allergic rhinitis due to  pollen 05/18/2017   Chronic renal disease, stage 3, moderately decreased glomerular filtration rate (GFR) between 30-59 mL/min/1.73 square meter ([place]) 66/29/4765   Systolic murmur 46/50/3546   Gastroesophageal reflux disease without esophagitis 12/19/2015   Essential hypertension, benign 12/19/2015   Hyperlipidemia with target LDL less than 70 12/19/2015   OA (osteoarthritis) of knee 08/03/2014   Coronary artery disease 07/10/2014     REFERRING PROVIDER: Durel Salts  REFERRING DIAG: Myofascial neck pain  THERAPY DIAG:  Cervicalgia  Abnormal posture  Rationale for Evaluation and Treatment: Rehabilitation  ONSET DATE: ~2 years.  SUBJECTIVE:                                                                                                                                                                                                         SUBJECTIVE STATEMENT: The patient presents to the clinic with c/o chronic neck pain and headaches, right > left, that has been ongoing for about 2 years.  Today, he rates his pain at a 4/10 but his pain can commonly rise to an 8/10.  He uses a TENS unit which gives him some relief as well as heat and medication.  Standing for long periods of time increases his pain.  PERTINENT HISTORY:  PACEMAKER, HTN, COPD, right TKA, basal cell carcinoma (recent removal from right upper back region).  PAIN:  Are you having pain? Yes: NPRS scale: 4/10 Pain location: Right cervical Pain description: Ache, sharp. Aggravating factors: As above. Relieving factors: As above.  PRECAUTIONS: ICD/Pacemaker  WEIGHT BEARING RESTRICTIONS: No  FALLS:  Has patient fallen in last 6 months? No  LIVING ENVIRONMENT: Lives with: lives with their spouse Lives in: House/apartment   OCCUPATION: Retired.  PLOF: Independent  PATIENT GOALS: Reduce pain and headaches which will improve quality of life.  NEXT MD VISIT:   OBJECTIVE:   PATIENT SURVEYS:  FOTO     POSTURE: rounded  shoulders and forward head.  Thoracic kyphosis.  PALPATION: Very tender to palpation over right suboccipital region and cervical paraspinal musculature.  Bilateral UT's are remarkable for increased tone.   CERVICAL ROM:   Active ROM A/PROM (deg) eval  Flexion   Extension 5  Right lateral flexion 5  Left lateral flexion 5  Right rotation 35  Left rotation 45   (Blank rows = not tested)  UPPER EXTREMITY ROM: WNL.  UPPER EXTREMITY MMT:  Normal bilateral UE strength.  SPECIAL TESTS:  Normal bilateral UE DTR's.  Felt better with cervical distraction performed in supine.    PATIENT EDUCATION:  Education details: As below.  Discussed correct posture. Person educated: Patient Education method: Explanation, Demonstration, and Handouts Education comprehension: verbalized understanding  HOME EXERCISE PROGRAM: [place] by [place] Shekera Beavers Nov 6th, 2023 View at [email or web address] using code: [email or web address]  Page 1 of 1  3 Exercises CERVICAL EXTENSION WITH TOWEL - CURVE OF NECK Start with a small hand towel wrapped around the curve of your neck and holding the ends of the towel forward as shown. Next, extend your neck back over the towel as to look up at the ceiling. Then, return to starting position.  Your hands should remain still and holding the ends of the towel the entire time. Repeat 5 Times Hold 2 Seconds Complete 1 Set Perform 3 Times a Day RETRACTION / CHIN TUCK Slowly draw your head back so that your ears line up with your shoulders. Repeat 10 Times Hold 5 Seconds Complete 1 Set Perform 6 Times a Day PROPER CERVICAL AND SPINAL POSTURE - SEATED Good posture positions your head over your shoulders so that your head is not protruding forward. Your ears should be over your shoulders.  Begin by correcting your low back so that it is not slouched. This will correct much of the spine. You may also need to perform a small  chin tuck as well.  The image on the right shows how you should position your head and spine throughout the day. This might be difficult at first but over time will get easier as your body adjusts  ASSESSMENT:  CLINICAL IMPRESSION: The patient presents to [facility] with c/o chronic neck pain and headaches.  His cervical active range of motion is very limited.  He is very palpably tender over his right suboccipital region.  His headaches will go to his right eye.  He has cervical paraspinal and UT tenderness and increased tone.  His UE DTR's are normal as is his UE strength. Cervical distraction made him feel better. Patient will benefit from skilled physical therapy intervention to address pain and deficits.  OBJECTIVE IMPAIRMENTS: decreased activity tolerance, decreased ROM, increased muscle spasms, and pain.   ACTIVITY LIMITATIONS: sitting and standing   PERSONAL FACTORS: Time since onset of injury/illness/exacerbation are also affecting patient's functional outcome.   REHAB POTENTIAL: Good  CLINICAL DECISION MAKING: Stable/uncomplicated  EVALUATION COMPLEXITY: Low   GOALS:  SHORT TERM GOALS: Target date: 04/24/2022   Ind with a HEP. Goal status: INITIAL   LONG TERM GOALS: Target date: 05/22/2022  Bilateral active cervical rotation to 60 degrees so he can turn head more easily while driving. Goal status: INITIAL  2.  Eliminate headaches. Goal status: INITIAL  3.  Sit/stand 20 minutes with pain not > 2-3/10. Goal status: INITIAL   PLAN:  PT FREQUENCY: 2x/week  PT DURATION: 6 weeks  PLANNED INTERVENTIONS: Therapeutic exercises, Therapeutic activity, Patient/Family education, Self Care, Dry Needling, Cryotherapy,  Moist heat, Traction, and Manual therapy  PLAN FOR NEXT SESSION: STW/M, suboccipital release technique, manual traction with progression to intermittent traction beginning at 13#, postural exercises, dry needling.   Shaliah Wann, [place], PT 04/10/2022, 12:11  PM

## 2022-04-17 ENCOUNTER — Encounter
Payer: Medicare Other | Attending: Physical Medicine and Rehabilitation | Admitting: Physical Medicine and Rehabilitation

## 2022-04-17 ENCOUNTER — Encounter: Payer: Self-pay | Admitting: Physical Medicine and Rehabilitation

## 2022-04-17 VITALS — BP 125/65 | HR 96 | Ht 72.0 in | Wt 197.8 lb

## 2022-04-17 DIAGNOSIS — M5030 Other cervical disc degeneration, unspecified cervical region: Secondary | ICD-10-CM | POA: Diagnosis not present

## 2022-04-17 DIAGNOSIS — M5481 Occipital neuralgia: Secondary | ICD-10-CM | POA: Diagnosis not present

## 2022-04-17 DIAGNOSIS — M4003 Postural kyphosis, cervicothoracic region: Secondary | ICD-10-CM | POA: Insufficient documentation

## 2022-04-17 DIAGNOSIS — M542 Cervicalgia: Secondary | ICD-10-CM | POA: Diagnosis not present

## 2022-04-17 DIAGNOSIS — M79644 Pain in right finger(s): Secondary | ICD-10-CM | POA: Diagnosis not present

## 2022-04-17 NOTE — Assessment & Plan Note (Signed)
Performed R GOND and LONB today as above; will follow up in 3 months to evaluate duration/extent of benefit.  Also agree traction with PT may be helpful modality.   Continue voltaren gel PRN

## 2022-04-17 NOTE — Assessment & Plan Note (Signed)
CT neck 10/13/20; appreciate spondylosis along with multi-level decreased disc height and anterior fusion throughout lower cervical and upper thoracic spine.   If pain uncontrollable with non-invasive measures, can consider referral to interventional pain for nerve blocks. Will hold off for now with trial of conservative treatments.

## 2022-04-17 NOTE — Patient Instructions (Addendum)
Continue voltaren gel as needed.  Keep up with PT! I'm glad you're getting good benefit from them. Do your home exercises, and I would recommend looking into dry needling with PT.   We can repeat trigger point injections or occipital nerve blocks every 3 months or so; I will schedule for you to follow up in 3 months to potentially repeat injections and review effect of PT, and to discuss pain management.  I have ordered xrays of your right hand for finger pain. In the interim, you may use voltaren gel on the painful joint 2-3x daily. Once those results are back, I will call you and we can refer you to someone for joint injections if appropriate.

## 2022-04-17 NOTE — Assessment & Plan Note (Signed)
Chronic with reduced ROM and pain in R 2nd MCP joint; likely OA vs. Less likely Inflammatory arthopathy such as RA, Gout  Will order xray to evaluate; will call and discuss results, may require outside referral for possible joint aspiration/injection. In the interim, can also use voltaren gel on hand.

## 2022-04-17 NOTE — Assessment & Plan Note (Signed)
Continue PT, voltaren gel. Limited perceived benefit of TPI; can still trial dry needling with PT to see of adjunctive treatments are helpful.  Follow up in 3 months

## 2022-04-17 NOTE — Assessment & Plan Note (Signed)
Continue working with PT on stretching, posture, and development of HEP.   Encouraged ongoing work on posture, as this will improve with consistent effort/attention. Discussed ergonomic changes to encourage good posture.

## 2022-04-17 NOTE — Progress Notes (Signed)
Subjective:    Patient ID: DEKLYN GIBBON, male    DOB: 1940/11/11, 81 y.o.   MRN: [id number]  HPI   ROLLY MAGRI is a 80 y.o. year old male  who  has a past medical history of Arthritis, Basal cell carcinoma (02/17/2020), Coronary artery disease, GERD (gastroesophageal reflux disease), H/O bronchitis, H/O hiatal hernia, History of kidney stones (last in 1976), Hyperlipidemia, Hypertension, Hypertensive retinopathy, Hypothyroidism, Moderate persistent asthma without complication (10/10/996), Retinal detachment, SCCA (squamous cell carcinoma) of skin (04/18/2016), and Sleep apnea presenting to PM&R clinic for TPI as part of treatment of pain in his shoulders/neck.   Plan from last visit: Neck pain of over 3 months duration Assessment & Plan: Recommend voltaren gel BID consistently until follow up. PT script as above with rom, stretching, and myofascial release. Continue TENS unit. Follow up in 2 weeks for bilateral TPI into cervical paraspinals, b/l trapezius.    Orders: -     Ambulatory referral to Physical Therapy   Chronic tension-type headache, not intractable Assessment & Plan: Headaches worsened with neck pain/stiffness and following GON distribution, however not reproducible on exam.   Will consider concurrent bilateral occipital nerve blocks as trial treatment when patient returns for TPI, based on improvements with PT and exam at that time.      Postural kyphosis of cervicothoracic region Assessment & Plan: Severely limiting neck ROM and contributing to postural difficulty and pain.  PT script given for stretching, ROM, myofascial release and posture   Orders: -     Ambulatory referral to Physical Therapy   Myofascial neck pain Assessment & Plan: Recommend voltaren gel BID consistently until follow up. PT script as above with rom, stretching, and myofascial release. Continue TENS unit. Follow up in 2 weeks for bilateral TPI into cervical paraspinals, b/l trapezius.     Orders: -     Ambulatory referral to Physical Therapy   Degenerative disc disease, cervical Assessment & Plan: Independently reviewed CT neck 10/13/20; appreciate spondylosis along with multi-level decreased disc height and anterior fusion throughout lower cervical and upper thoracic spine.   If pain uncontrollable with non-invasive measures, can consider referral to Dr. Kierstens/interventional pain for nerve blocks.   Interval Hx:  - TPI 03/20/22 - Worked "okay". Unsure of extent of benefit given multiple interventions at once.  - PT 11/6 - got HEP for stretching, cervical distraction. He says it was more evalaution and less treatment last week.  - R occipital headaches; he feels like he has better ROM after having a masseuse work on it and doing exercises from rehab.  - Average pain 3/10 now, worst 7/8-10.  While his average pain hasn't changes much, the worst days have gotten considerably more mild. - He is not sure if the voltaren gel helps; he says he gets more benefit from it on his elbow than his neck.  - Pt endorses chronic severe R 2nd finger pain. Constant, can't grip or extend the joint without severe pain. No Hx gout. Never had it imaged. No inciting incident; has been hurting for 3-4 years.   Pain Inventory Average Pain 4 Pain Right Now 2 My pain is  .  In the last 24 hours, has pain interfered with the following? General activity 2 Relation with others 2 Enjoyment of life 3 What TIME of day is your pain at its worst? evening Sleep (in general) Good  Pain is worse with: walking and standing Pain improves with: heat/ice and TENS Relief from Meds: 3  Family History  Problem Relation Age of Onset   Heart attack Father 64       lived to 25   Alzheimer's disease Mother    Stroke Mother    Breast cancer Mother    Alzheimer's disease Maternal Grandmother    Stroke Maternal Grandmother    Lung cancer Maternal Grandmother    Lung cancer Maternal Grandfather     Cancer Paternal Grandmother        type unknown   Diabetes Paternal Grandfather    Other Paternal Grandfather        Growth in the back of the head   Social History   Socioeconomic History   Marital status: Divorced    Spouse name: Not on file   Number of children: 0   Years of education: Not on file   Highest education level: Not on file  Occupational History   Occupation: Retired   Tobacco Use   Smoking status: Never   Smokeless tobacco: Never  Vaping Use   Vaping Use: Never used  Substance and Sexual Activity   Alcohol use: Yes    Alcohol/week: 4.0 standard drinks of alcohol    Types: 4 Standard drinks or equivalent per week    Comment: 3 drinks at night    Drug use: No   Sexual activity: Not on file  Other Topics Concern   Not on file  Social History Narrative   Not on file   Social Determinants of Health   Financial Resource Strain: Low Risk  (04/25/2021)   Overall Financial Resource Strain (CARDIA)    Difficulty of Paying Living Expenses: Not hard at all  Food Insecurity: No Food Insecurity (04/25/2021)   Hunger Vital Sign    Worried About Running Out of Food in the Last Year: Never true    Ran Out of Food in the Last Year: Never true  Transportation Needs: No Transportation Needs (04/25/2021)   PRAPARE - Hydrologist (Medical): No    Lack of Transportation (Non-Medical): No  Physical Activity: Insufficiently Active (04/25/2021)   Exercise Vital Sign    Days of Exercise per Week: 3 days    Minutes of Exercise per Session: 30 min  Stress: No Stress Concern Present (04/25/2021)   [place]    Feeling of Stress : Not at all  Social Connections: Moderately Integrated (04/25/2021)   Social Connection and Isolation Panel [NHANES]    Frequency of Communication with Friends and Family: Twice a week    Frequency of Social Gatherings with Friends and Family: Twice a week     Attends Religious Services: Never    Marine scientist or Organizations: Yes    Attends Archivist Meetings: 1 to 4 times per year    Marital Status: Married   Past Surgical History:  Procedure Laterality Date   CARDIAC CATHETERIZATION Right 2008   5 heart stents   CATARACT EXTRACTION Bilateral 6 years ago   CYSTOSCOPY N/A 08/06/2012   Procedure: CYSTOSCOPY;  Surgeon: [name], MD;  Location: [facility];  Service: Urology;  Laterality: N/A;   ESOPHAGOGASTRIC FUNDOPLICATION  7829   spleen removed, 5 pints of blood given   EYE SURGERY     HERNIA REPAIR  1985   x4   INGUINAL HERNIA REPAIR  1992   PACEMAKER IMPLANT N/A 10/30/2018   Procedure: PACEMAKER IMPLANT;  Surgeon: Constance Haw, MD;  Location: [place]  [facility];  Service: Cardiovascular;  Laterality: N/A;   PACEMAKER INSERTION     PPM GENERATOR REMOVAL N/A 10/25/2018   Procedure: PPM GENERATOR REMOVAL;  Surgeon: Constance Haw, MD;  Location: [place] [facility];  Service: Cardiovascular;  Laterality: N/A;   PROSTATE ABLATION  2013   "laser ablation"   RETINAL DETACHMENT SURGERY Right 01/24/2019   Pneumatic cryopexy - Dr. Bernarda Caffey   TEE WITHOUT CARDIOVERSION N/A 10/25/2018   Procedure: TRANSESOPHAGEAL ECHOCARDIOGRAM (TEE);  Surgeon: Acie Fredrickson Wonda Cheng, MD;  Location: [facility] ENDOS[facility];  Service: Cardiovascular;  Laterality: N/A;   TEMPORARY PACEMAKER N/A 10/25/2018   Procedure: TEMPORARY PACEMAKER;  Surgeon: Constance Haw, MD;  Location: [place] [facility];  Service: Cardiovascular;  Laterality: N/A;   TONSILLECTOMY  as child   TOTAL KNEE ARTHROPLASTY Right 08/03/2014   Procedure: TOTAL RIGHT KNEE ARTHROPLASTY;  Surgeon: [name], MD;  Location: [facility];  Service: Orthopedics;  Laterality: Right;   TRANSURETHRAL RESECTION OF PROSTATE N/A 08/06/2012   Procedure: Almyra Free OF PROSTATE WITH GYRUS ;  Surgeon: [name], MD;  Location: [facility];  Service: Urology;  Laterality: N/A;   UMBILICAL  HERNIA REPAIR  1990   UVULECTOMY  2005   for sleep apnea   UVULOPALATOPHARYNGOPLASTY  6 years ago   Past Surgical History:  Procedure Laterality Date   CARDIAC CATHETERIZATION Right 2008   5 heart stents   CATARACT EXTRACTION Bilateral 6 years ago   CYSTOSCOPY N/A 08/06/2012   Procedure: CYSTOSCOPY;  Surgeon: [name], MD;  [facility]: [facility];  Service: Urology;  Laterality: N/A;   ESOPHAGOGASTRIC FUNDOPLICATION  7616   spleen removed, 5 pints of blood given   EYE SURGERY     HERNIA REPAIR  1985   x4   INGUINAL HERNIA REPAIR  1992   PACEMAKER IMPLANT N/A 10/30/2018   Procedure: PACEMAKER IMPLANT;  Surgeon: Constance Haw, MD;  Location: [place] [facility];  Service: Cardiovascular;  Laterality: N/A;   PACEMAKER INSERTION     PPM GENERATOR REMOVAL N/A 10/25/2018   Procedure: PPM GENERATOR REMOVAL;  Surgeon: Constance Haw, MD;  Location: [place] [facility];  Service: Cardiovascular;  Laterality: N/A;   PROSTATE ABLATION  2013   "laser ablation"   RETINAL DETACHMENT SURGERY Right 01/24/2019   Pneumatic cryopexy - Dr. Bernarda Caffey   TEE WITHOUT CARDIOVERSION N/A 10/25/2018   Procedure: TRANSESOPHAGEAL ECHOCARDIOGRAM (TEE);  Surgeon: Acie Fredrickson Wonda Cheng, MD;  Location: [facility] ENDOSCOPY;  Service: Cardiovascular;  Laterality: N/A;   TEMPORARY PACEMAKER N/A 10/25/2018   Procedure: TEMPORARY PACEMAKER;  Surgeon: Constance Haw, MD;  Location: [place] [facility];  Service: Cardiovascular;  Laterality: N/A;   TONSILLECTOMY  as child   TOTAL KNEE ARTHROPLASTY Right 08/03/2014   Procedure: TOTAL RIGHT KNEE ARTHROPLASTY;  Surgeon: [name], MD;  [facility];  Service: Orthopedics;  Laterality: Right;   TRANSURETHRAL RESECTION OF PROSTATE N/A 08/06/2012   Procedure: Almyra Free OF PROSTATE WITH GYRUS ;  Surgeon: [name], MD;  Location: [facility];  Service: Urology;  Laterality: N/A;   UMBILICAL HERNIA REPAIR  1990   UVULECTOMY  2005   for sleep apnea    UVULOPALATOPHARYNGOPLASTY  6 years ago   Past Medical History:  Diagnosis Date   Arthritis    Basal cell carcinoma 02/17/2020   infl & ulcerated-Left forehead (MOHS)   Coronary artery disease    GERD (gastroesophageal reflux disease)    H/O bronchitis  H/O hiatal hernia    History of kidney stones last in 1976   Hyperlipidemia    statin intolerant, under control   Hypertension    under control   Hypertensive retinopathy    OU   Hypothyroidism    Moderate persistent asthma without complication 11/03/4429   Retinal detachment    OD   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   left helix tx cx3 72f   Sleep apnea    hx of, surgery to reverse   Ht 6' (1.829 m)   BMI 27.40 kg/m   Opioid Risk Score:   Fall Risk Score:  `1  [facility]     04/25/2021    9:56 AM 04/25/2021    9:53 AM 02/10/2020    2:40 PM 12/22/2019   11:29 AM 01/02/2019   11:09 AM 06/27/2018    3:46 PM 06/26/2017    1:18 PM  Depression screen PHQ 2/9  Decreased Interest 0 0 0 0 0 0 0  Down, Depressed, Hopeless 0 0 0 0 0 0 0  PHQ - 2 Score 0 0 0 0 0 0 0      Review of Systems  Musculoskeletal:  Positive for back pain and neck pain.  All other systems reviewed and are negative.     Objective:   Physical Exam   Physical Exam:  General: Appropriate appearance for age.  Mental Status: Appropriate mood and affect.  Cardiovascular: RRR, no m/r/g.  Respiratory: CTAB, no rales/rhonchi/wheezing.  Skin: No apparent rashes or lesions.  Neuro: Awake, alert, and oriented x3. No apparent deficits.  MSK: Moving all 4 limbs antigravity and against resistance. R grip reduced to 4/5 in 2nd digit only d/t discomfort with full AROM.  Notable enlarged joints in bilateral hands and MCP and DIPS.   Neck: AROM still extremely limited in extension, bilateral rotation and bilateral sidebending; notably increased ROM in all planes since last assessment.  + shooting pain with headache symptoms  reproduced with palpation of R greater and lesser occipital nerves, mildly reproduced on L   PROCEDURE: Right greater and lesser occipital nerve blocks Diagnosis: R occipital neuralgia Goals with treatment: [ x] Decrease pain [ x] Improve Active / Passive ROM [ x] Improve ADLs '[ ]'$  Improve functional mobility  MEDICATION:  [ x] Kenalog 40 mg/mL -  0.2 ccs [x ] Lidocaine 1% - 6 ccs     CONSENT: Obtained in writing followed by time-out per  policy. Consent uploaded to chart.  Benefits discussed.  Risks discussed included, but were not limited to, pain and discomfort, bleeding, bruising, allergic reaction, infection. All questions answered to patient/family member/guardian/ caregiver satisfaction. They would like to proceed with procedure. There are no noted contraindications to procedure.  PROCEDURE Time out was preformed No heat sources No antibiotics  The patient was explained about both the benefits and risks of a occipital nerve block injection. After the patient acknowledged an understanding of the risks and benefits, the patient agreed to proceed. The R greater occipital nerve was marked 1/3 way between occiput and mastoid process, just lateral to the palpated occipital artery. The R lesser occipital nerve was marked just posterior to the mastoid process. The areas were then prepped in an aseptic fashion with alcohol. A 30g, 1/2 inch needle was directed via a posterior approach into the marked aread. The injection was completed with Kenalog 0.2 cc mixed with 6 cc of 1% lidocaine (3 ccs at greater occipital nerve site, 2 ccs  at lesser site, 1 cc wasted) after no blood was aspirated on pull back.  No complications were encountered. The patient tolerated the procedure well.  Patient/Care Justus Memory was ready to learn without apparent learning barriers. Education was provided on diagnosis, treatment options/plan according to patient's preferred learning style. Patient/Care Giver verbalized  understanding and agreement with the above plan.     Assessment & Plan:   TIA GELB is a 81 y.o. year old male  who  has a past medical history of Arthritis, Basal cell carcinoma (02/17/2020), Coronary artery disease, GERD (gastroesophageal reflux disease), H/O bronchitis, H/O hiatal hernia, History of kidney stones (last in 1976), Hyperlipidemia, Hypertension, Hypertensive retinopathy, Hypothyroidism, Moderate persistent asthma without complication (06/06/2480), Retinal detachment, SCCA (squamous cell carcinoma) of skin (04/18/2016), and Sleep apnea presenting to PM&R clinic for ongoing treatment of pain in his shoulders/neck, primarily myofascial with components of possible facet arthropathy and mixed-type headaches including tension type and R occipital neuralgia.  Finger pain, right Assessment & Plan: Chronic with reduced ROM and pain in R 2nd MCP joint; likely OA vs. Less likely Inflammatory arthopathy such as RA, Gout  Will order xray to evaluate; will call and discuss results, may require outside referral for possible joint aspiration/injection. In the interim, can also use voltaren gel on hand.  Orders: -     DG Hand 2 View Right; Future  Cervicalgia Assessment & Plan: Continue PT, voltaren gel. Limited perceived benefit of TPI; can still trial dry needling with PT to see of adjunctive treatments are helpful.  Follow up in 3 months   Cervico-occipital neuralgia of right side Assessment & Plan: Performed R GOND and LONB today as above; will follow up in 3 months to evaluate duration/extent of benefit.  Also agree traction with PT may be helpful modality.   Continue voltaren gel PRN   Degenerative disc disease, cervical Assessment & Plan: CT neck 10/13/20; appreciate spondylosis along with multi-level decreased disc height and anterior fusion throughout lower cervical and upper thoracic spine.   If pain uncontrollable with non-invasive measures, can consider referral to  interventional pain for nerve blocks. Will hold off for now with trial of conservative treatments.   Postural kyphosis of cervicothoracic region Assessment & Plan: Continue working with PT on stretching, posture, and development of HEP.   Encouraged ongoing work on posture, as this will improve with consistent effort/attention. Discussed ergonomic changes to encourage good posture.       Gertie Gowda, DO 04/17/2022

## 2022-04-18 ENCOUNTER — Ambulatory Visit: Payer: Medicare Other | Admitting: Physical Therapy

## 2022-04-18 ENCOUNTER — Telehealth: Payer: Self-pay | Admitting: Internal Medicine

## 2022-04-18 ENCOUNTER — Encounter: Payer: Self-pay | Admitting: Physical Therapy

## 2022-04-18 DIAGNOSIS — M542 Cervicalgia: Secondary | ICD-10-CM | POA: Diagnosis not present

## 2022-04-18 DIAGNOSIS — R293 Abnormal posture: Secondary | ICD-10-CM

## 2022-04-18 DIAGNOSIS — M4003 Postural kyphosis, cervicothoracic region: Secondary | ICD-10-CM | POA: Diagnosis not present

## 2022-04-18 NOTE — Telephone Encounter (Signed)
Left message for patient to call back and schedule Left message for patient to call back and schedule Medicare Annual Wellness Visit (AWV).   Please offer to do virtually or by telephone for Nurse 2's schedule.   45 minute appointment  Please schedule at anytime with Lehr Advisor's schedule   30 minute appointment for virtual and 45 for in office.  Last AWV: 04/25/2021  If any questions, please contact me at [phone]

## 2022-04-18 NOTE — Therapy (Signed)
OUTPATIENT PHYSICAL THERAPY CERVICAL EVALUATION   Patient Name: [name] Glass MRN: [id number] DOB:Jun 28, 1940, 81 y.o., male Today's Date: 04/18/2022   PT End of Session - 04/18/22 1109     Visit Number 2    Number of Visits 12    Date for PT Re-Evaluation 07/09/22    Authorization Type FOTO AT LEAST EVERY 5TH VISIT.  PROGRESS NOTE AT 10TH VISIT.  KX MODIFIER AFTER 15 VISITS.    PT Start Time 1037    PT Stop Time 1115    PT Time Calculation (min) 38 min    Activity Tolerance Patient tolerated treatment well    Behavior During Therapy WFL for tasks assessed/performed             Past Medical History:  Diagnosis Date   Arthritis    Basal cell carcinoma 02/17/2020   infl & ulcerated-Left forehead (MOHS)   Coronary artery disease    GERD (gastroesophageal reflux disease)    H/O bronchitis    H/O hiatal hernia    History of kidney stones last in 1976   Hyperlipidemia    statin intolerant, under control   Hypertension    under control   Hypertensive retinopathy    OU   Hypothyroidism    Moderate persistent asthma without complication 11/05/9474   Retinal detachment    OD   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   left helix tx cx3 32f   Sleep apnea    hx of, surgery to reverse   Past Surgical History:  Procedure Laterality Date   CARDIAC CATHETERIZATION Right 2008   5 heart stents   CATARACT EXTRACTION Bilateral 6 years ago   CYSTOSCOPY N/A 08/06/2012   Procedure: CYSTOSCOPY;  Surgeon: JMalka So MD;  [facility];  Service: Urology;  Laterality: N/A;   ESOPHAGOGASTRIC FUNDOPLICATION  15465  spleen removed, 5 pints of blood given   EYE SURGERY     HERNIA REPAIR  1985   x4   INGUINAL HERNIA REPAIR  1992   PACEMAKER IMPLANT N/A 10/30/2018   Procedure: PACEMAKER IMPLANT;  Surgeon: CConstance Haw MD;  [place] [facility];  Service: Cardiovascular;  Laterality: N/A;   PACEMAKER INSERTION     PPM GENERATOR REMOVAL N/A 10/25/2018    Procedure: PPM GENERATOR REMOVAL;  Surgeon: CConstance Haw MD;  [facility];  Service: Cardiovascular;  Laterality: N/A;   PROSTATE ABLATION  2013   "laser ablation"   RETINAL DETACHMENT SURGERY Right 01/24/2019   Pneumatic cryopexy - Dr. BBernarda Caffey  TEE WITHOUT CARDIOVERSION N/A 10/25/2018   Procedure: TRANSESOPHAGEAL ECHOCARDIOGRAM (TEE);  Surgeon: NAcie FredricksonPWonda Cheng MD;  [facility];  Service: Cardiovascular;  Laterality: N/A;   TEMPORARY PACEMAKER N/A 10/25/2018   Procedure: TEMPORARY PACEMAKER;  Surgeon: CConstance Haw MD;  [facility];  Service: Cardiovascular;  Laterality: N/A;   TONSILLECTOMY  as child   TOTAL KNEE ARTHROPLASTY Right 08/03/2014   Procedure: TOTAL RIGHT KNEE ARTHROPLASTY;  Surgeon: FGearlean Alf MD;  [facility];  Service: Orthopedics;  Laterality: Right;   TRANSURETHRAL RESECTION OF PROSTATE N/A 08/06/2012   Procedure: FAlmyra FreeOF PROSTATE WITH GYRUS ;  Surgeon: JMalka So MD;  [facility];  Service: Urology;  Laterality: N/A;   UMBILICAL HERNIA REPAIR  1990   UVULECTOMY  2005   for sleep apnea   UVULOPALATOPHARYNGOPLASTY  6 years ago   Patient Active Problem List  Diagnosis Date Noted   Finger pain, right 04/17/2022   Cervico-occipital neuralgia of right side 04/17/2022   Chronic tension-type headache, not intractable 02/27/2022   Postural kyphosis of cervicothoracic region 02/27/2022   Moderate aortic stenosis 10/27/2020   Thoracic aortic aneurysm ([place]) 03/12/2020   Sinus node dysfunction (HCC) 02/17/2020   Moderate persistent asthma without complication 06/13/3233   Chronic bronchitis, mucopurulent ([place]) 06/09/2019   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   Cervicalgia 04/01/2019   Degenerative disc disease, cervical 04/01/2019   Hypothyroidism    Presence of permanent cardiac pacemaker 10/23/2018   Sleep apnea 10/23/2018   PSA elevation 06/26/2017   Benign prostatic hyperplasia without  lower urinary tract symptoms 05/21/2017   Seasonal allergic rhinitis due to pollen 05/18/2017   Chronic renal disease, stage 3, moderately decreased glomerular filtration rate (GFR) between 30-59 mL/min/1.73 square meter ([place]) 57/32/2025   Systolic murmur 42/70/6237   Gastroesophageal reflux disease without esophagitis 12/19/2015   Essential hypertension, benign 12/19/2015   Hyperlipidemia with target LDL less than 70 12/19/2015   OA (osteoarthritis) of knee 08/03/2014   Coronary artery disease 07/10/2014     REFERRING PROVIDER: Durel Salts  REFERRING DIAG: Myofascial neck pain  THERAPY DIAG:  Cervicalgia  Abnormal posture  Rationale for Evaluation and Treatment: Rehabilitation  ONSET DATE: ~2 years.  SUBJECTIVE:                                                                                                                                                                                                         SUBJECTIVE STATEMENT: Got an injection in back of neck yesterday that was helpful. PERTINENT HISTORY:  PACEMAKER, HTN, COPD, right TKA, basal cell carcinoma (recent removal from right upper back region).  PAIN:  Are you having pain? Yes: NPRS scale: 2/10 Pain location: Right cervical Pain description: Ache, sharp. Aggravating factors: As above. Relieving factors: As above.  PRECAUTIONS: ICD/Pacemaker  PLOF: Independent  PATIENT GOALS: Reduce pain and headaches which will improve quality of life.  NEXT MD VISIT:   OBJECTIVE:   Today's Treatment:  In supine:  STW/M x 23 minutes to patient's affected cervical paraspinal musculature and suboccipital region including ischemic release technique and gentle manual traction f/b HMP x 10 minutes.  ASSESSMENT:  CLINICAL IMPRESSION: Patient presenting to the clinic with decreases pain since receiving an injection yesterday.  He did well with treatment today and felt good afterwards.     GOALS:  SHORT TERM GOALS:  Target date: 05/02/2022   Ind with a HEP. Goal status: INITIAL   LONG TERM GOALS: Target  date: 05/30/2022  Bilateral active cervical rotation to 60 degrees so he can turn head more easily while driving. Goal status: INITIAL  2.  Eliminate headaches. Goal status: INITIAL  3.  Sit/stand 20 minutes with pain not > 2-3/10. Goal status: INITIAL   PLAN:  PT FREQUENCY: 2x/week  PT DURATION: 6 weeks  PLANNED INTERVENTIONS: Therapeutic exercises, Therapeutic activity, Patient/Family education, Self Care, Dry Needling, Cryotherapy, Moist heat, Traction, and Manual therapy  PLAN FOR NEXT SESSION: STW/M, suboccipital release technique, manual traction with progression to intermittent traction beginning at 13#, postural exercises, dry needling.   Champagne Paletta, [place], PT 04/18/2022, 12:02 PM

## 2022-04-21 ENCOUNTER — Other Ambulatory Visit: Payer: Self-pay | Admitting: Internal Medicine

## 2022-04-21 DIAGNOSIS — I2510 Atherosclerotic heart disease of native coronary artery without angina pectoris: Secondary | ICD-10-CM

## 2022-04-21 DIAGNOSIS — I10 Essential (primary) hypertension: Secondary | ICD-10-CM

## 2022-04-24 ENCOUNTER — Ambulatory Visit: Payer: Medicare Other | Admitting: Physical Therapy

## 2022-04-24 DIAGNOSIS — M542 Cervicalgia: Secondary | ICD-10-CM | POA: Diagnosis not present

## 2022-04-24 DIAGNOSIS — M4003 Postural kyphosis, cervicothoracic region: Secondary | ICD-10-CM | POA: Diagnosis not present

## 2022-04-24 DIAGNOSIS — R293 Abnormal posture: Secondary | ICD-10-CM

## 2022-04-24 NOTE — Therapy (Signed)
[name] OUTPATIENT PHYSICAL THERAPY CERVICAL EVALUATION   Patient Name: CARLSON BELLAND MRN: [id number] DOB:31-Dec-1940, 81 y.o., male Today's Date: 04/24/2022   PT End of Session - 04/24/22 1155     Visit Number 3    Number of Visits 12    Date for PT Re-Evaluation 07/09/22    Authorization Type FOTO AT LEAST EVERY 5TH VISIT.  PROGRESS NOTE AT 10TH VISIT.  KX MODIFIER AFTER 15 VISITS.    PT Start Time 1048    PT Stop Time 1128    PT Time Calculation (min) 40 min    Activity Tolerance Patient tolerated treatment well    Behavior During Therapy WFL for tasks assessed/performed             Past Medical History:  Diagnosis Date   Arthritis    Basal cell carcinoma 02/17/2020   infl & ulcerated-Left forehead (MOHS)   Coronary artery disease    GERD (gastroesophageal reflux disease)    H/O bronchitis    H/O hiatal hernia    History of kidney stones last in 1976   Hyperlipidemia    statin intolerant, under control   Hypertension    under control   Hypertensive retinopathy    OU   Hypothyroidism    Moderate persistent asthma without complication 08/09/6281   Retinal detachment    OD   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   left helix tx cx3 83f   Sleep apnea    hx of, surgery to reverse   Past Surgical History:  Procedure Laterality Date   CARDIAC CATHETERIZATION Right 2008   5 heart stents   CATARACT EXTRACTION Bilateral 6 years ago   CYSTOSCOPY N/A 08/06/2012   Procedure: CYSTOSCOPY;  Surgeon: JMalka So MD;  [facility];  Service: Urology;  Laterality: N/A;   ESOPHAGOGASTRIC FUNDOPLICATION  11517  spleen removed, 5 pints of blood given   EYE SURGERY     HERNIA REPAIR  1985   x4   INGUINAL HERNIA REPAIR  1992   PACEMAKER IMPLANT N/A 10/30/2018   Procedure: PACEMAKER IMPLANT;  Surgeon: CConstance Haw MD;  [facility];  Service: Cardiovascular;  Laterality: N/A;   PACEMAKER INSERTION     PPM GENERATOR REMOVAL N/A 10/25/2018    Procedure: PPM GENERATOR REMOVAL;  Surgeon: CConstance Haw MD;  [facility];  Service: Cardiovascular;  Laterality: N/A;   PROSTATE ABLATION  2013   "laser ablation"   RETINAL DETACHMENT SURGERY Right 01/24/2019   Pneumatic cryopexy - Dr. BBernarda Caffey  TEE WITHOUT CARDIOVERSION N/A 10/25/2018   Procedure: TRANSESOPHAGEAL ECHOCARDIOGRAM (TEE);  Surgeon: NAcie FredricksonPWonda Cheng MD;  [facility];  Service: Cardiovascular;  Laterality: N/A;   TEMPORARY PACEMAKER N/A 10/25/2018   Procedure: TEMPORARY PACEMAKER;  Surgeon: CConstance Haw MD;  [facility];  Service: Cardiovascular;  Laterality: N/A;   TONSILLECTOMY  as child   TOTAL KNEE ARTHROPLASTY Right 08/03/2014   Procedure: TOTAL RIGHT KNEE ARTHROPLASTY;  Surgeon: FGearlean Alf MD;  [facility];  Service: Orthopedics;  Laterality: Right;   TRANSURETHRAL RESECTION OF PROSTATE N/A 08/06/2012   Procedure: FAlmyra FreeOF PROSTATE WITH GYRUS ;  Surgeon: JMalka So MD;  [facility];  Service: Urology;  Laterality: N/A;   UMBILICAL HERNIA REPAIR  1990   UVULECTOMY  2005   for sleep apnea   UVULOPALATOPHARYNGOPLASTY  6 years ago   Patient Active Problem List  Diagnosis Date Noted   Finger pain, right 04/17/2022   Cervico-occipital neuralgia of right side 04/17/2022   Chronic tension-type headache, not intractable 02/27/2022   Postural kyphosis of cervicothoracic region 02/27/2022   Moderate aortic stenosis 10/27/2020   Thoracic aortic aneurysm ([place]) 03/12/2020   Sinus node dysfunction (HCC) 02/17/2020   Moderate persistent asthma without complication 93/57/0177   Chronic bronchitis, mucopurulent ([place]) 06/09/2019   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   Cervicalgia 04/01/2019   Degenerative disc disease, cervical 04/01/2019   Hypothyroidism    Presence of permanent cardiac pacemaker 10/23/2018   Sleep apnea 10/23/2018   PSA elevation 06/26/2017   Benign prostatic hyperplasia without  lower urinary tract symptoms 05/21/2017   Seasonal allergic rhinitis due to pollen 05/18/2017   Chronic renal disease, stage 3, moderately decreased glomerular filtration rate (GFR) between 30-59 mL/min/1.73 square meter ([place]) 93/90/3009   Systolic murmur 23/30/0762   Gastroesophageal reflux disease without esophagitis 12/19/2015   Essential hypertension, benign 12/19/2015   Hyperlipidemia with target LDL less than 70 12/19/2015   OA (osteoarthritis) of knee 08/03/2014   Coronary artery disease 07/10/2014     REFERRING PROVIDER: Durel Salts  REFERRING DIAG: Myofascial neck pain  THERAPY DIAG:  No diagnosis found.  Rationale for Evaluation and Treatment: Rehabilitation  ONSET DATE: ~2 years.  SUBJECTIVE:                                                                                                                                                                                                         SUBJECTIVE STATEMENT: Therapy is helping.  PERTINENT HISTORY:  PACEMAKER, HTN, COPD, right TKA, basal cell carcinoma (recent removal from right upper back region).  PAIN:  Are you having pain? Yes: NPRS scale: 2/10 Pain location: Right cervical Pain description: Ache, sharp. Aggravating factors: As above. Relieving factors: As above.  PRECAUTIONS: ICD/Pacemaker  PLOF: Independent  PATIENT GOALS: Reduce pain and headaches which will improve quality of life.  NEXT MD VISIT:   OBJECTIVE:  Trigger Point Dry-Needling  Treatment instructions: Expect mild to moderate muscle soreness. S/S of pneumothorax if dry needled over a lung field, and to seek immediate medical attention should they occur. Patient verbalized understanding of these instructions and education.  Patient Consent Given: Yes Education handout provided: Yes Muscles treated: Splenius capitus/cervicis.  Treatment response/outcome: Twitch  f/b STW/M x 23 minutes to patient's affected cervical paraspinal  musculature and suboccipital region including ischemic release technique and gentle manual traction f/b HMP x 10 minutes.  ASSESSMENT:  CLINICAL IMPRESSION: Patient presenting to the clinic with decreases pain  since receiving an injection yesterday.  He feels therapy is helping.  He is working on his posture and the chin tuck exercise.  He did very well with dry needling, tolerating without complaint. GOALS:  SHORT TERM GOALS: Target date: 05/08/2022   Ind with a HEP. Goal status: INITIAL   LONG TERM GOALS: Target date: 06/05/2022  Bilateral active cervical rotation to 60 degrees so he can turn head more easily while driving. Goal status: INITIAL  2.  Eliminate headaches. Goal status: INITIAL  3.  Sit/stand 20 minutes with pain not > 2-3/10. Goal status: INITIAL   PLAN:  PT FREQUENCY: 2x/week  PT DURATION: 6 weeks  PLANNED INTERVENTIONS: Therapeutic exercises, Therapeutic activity, Patient/Family education, Self Care, Dry Needling, Cryotherapy, Moist heat, Traction, and Manual therapy  PLAN FOR NEXT SESSION: STW/M, suboccipital release technique, manual traction with progression to intermittent traction beginning at 13#, postural exercises, dry needling.   Uzair Godley, [place], PT 04/24/2022, 11:57 AM

## 2022-04-25 DIAGNOSIS — Z23 Encounter for immunization: Secondary | ICD-10-CM | POA: Diagnosis not present

## 2022-04-26 ENCOUNTER — Telehealth: Payer: Self-pay | Admitting: Internal Medicine

## 2022-04-26 ENCOUNTER — Ambulatory Visit (INDEPENDENT_AMBULATORY_CARE_PROVIDER_SITE_OTHER): Payer: Medicare Other

## 2022-04-26 DIAGNOSIS — I495 Sick sinus syndrome: Secondary | ICD-10-CM | POA: Diagnosis not present

## 2022-04-26 DIAGNOSIS — Z950 Presence of cardiac pacemaker: Secondary | ICD-10-CM | POA: Diagnosis not present

## 2022-04-26 LAB — CUP PACEART REMOTE DEVICE CHECK
Battery Remaining Longevity: 134 mo
Battery Voltage: 3.02 V
Brady Statistic AP VP Percent: 1.32 %
Brady Statistic AP VS Percent: 41.04 %
Brady Statistic AS VP Percent: 0.58 %
Brady Statistic AS VS Percent: 57.07 %
Brady Statistic RA Percent Paced: 42.07 %
Brady Statistic RV Percent Paced: 1.89 %
Date Time Interrogation Session: 20231121231737
Implantable Lead Connection Status: 753985
Implantable Lead Connection Status: 753985
Implantable Lead Implant Date: 20200527
Implantable Lead Implant Date: 20200527
Implantable Lead Location: 753859
Implantable Lead Location: 753860
Implantable Lead Model: 5076
Implantable Lead Model: 5076
Implantable Pulse Generator Implant Date: 20200527
Lead Channel Impedance Value: 380 Ohm
Lead Channel Impedance Value: 418 Ohm
Lead Channel Impedance Value: 437 Ohm
Lead Channel Impedance Value: 589 Ohm
Lead Channel Pacing Threshold Amplitude: 0.375 V
Lead Channel Pacing Threshold Amplitude: 0.5 V
Lead Channel Pacing Threshold Pulse Width: 0.4 ms
Lead Channel Pacing Threshold Pulse Width: 0.4 ms
Lead Channel Sensing Intrinsic Amplitude: 12 mV
Lead Channel Sensing Intrinsic Amplitude: 12 mV
Lead Channel Sensing Intrinsic Amplitude: 5.125 mV
Lead Channel Sensing Intrinsic Amplitude: 5.125 mV
Lead Channel Setting Pacing Amplitude: 1.5 V
Lead Channel Setting Pacing Amplitude: 2.5 V
Lead Channel Setting Pacing Pulse Width: 0.4 ms
Lead Channel Setting Sensing Sensitivity: 2 mV
Zone Setting Status: 755011
Zone Setting Status: 755011

## 2022-04-26 NOTE — Telephone Encounter (Signed)
Patient came in and dropped Handicap Decal Forms he needs filled out and mailed. Patient left paperwork with an envelope and stamp. Form was placed in Dr. Ronnald Ramp box at the front.

## 2022-05-01 ENCOUNTER — Ambulatory Visit: Payer: Medicare Other | Admitting: Physical Therapy

## 2022-05-01 ENCOUNTER — Ambulatory Visit (INDEPENDENT_AMBULATORY_CARE_PROVIDER_SITE_OTHER): Payer: Medicare Other | Admitting: *Deleted

## 2022-05-01 DIAGNOSIS — Z0000 Encounter for general adult medical examination without abnormal findings: Secondary | ICD-10-CM | POA: Diagnosis not present

## 2022-05-01 DIAGNOSIS — M4003 Postural kyphosis, cervicothoracic region: Secondary | ICD-10-CM | POA: Diagnosis not present

## 2022-05-01 DIAGNOSIS — M542 Cervicalgia: Secondary | ICD-10-CM | POA: Diagnosis not present

## 2022-05-01 DIAGNOSIS — R293 Abnormal posture: Secondary | ICD-10-CM | POA: Diagnosis not present

## 2022-05-01 NOTE — Progress Notes (Signed)
Subjective:   [name] Glass is a 81 y.o. male who presents for Medicare Annual/Subsequent preventive examination. I connected with  [name] on 05/01/22 by a audio enabled telemedicine application and verified that I am speaking with the correct person using two identifiers.  Patient Location: Home  Provider Location: Home Office  I discussed the limitations of evaluation and management by telemedicine. The patient expressed understanding and agreed to proceed.  Review of Systems    Deferred to PCP Cardiac Risk Factors include: advanced age (>62mn, >>51women);dyslipidemia;male gender;hypertension     Objective:    Today's Vitals   05/01/22 1408  PainSc: 3    There is no height or weight on file to calculate BMI.     05/01/2022    2:22 PM 04/10/2022   12:04 PM 06/09/2021    1:27 PM 04/25/2021    9:55 AM 02/10/2020    2:42 PM 07/05/2019    5:07 AM 10/30/2018   10:00 PM  Advanced Directives  Does Patient Have a Medical Advance Directive? Yes Yes Yes Yes Yes Yes   Type of AParamedicof ACarlisleLiving will  Living will;Healthcare Power of A[place]Out of facility DNR (pink MOST or yellow form) H[place]illaLiving will Living will;Healthcare Power of [place]forkLiving will [place]idloth[place]Living will  Does patient want to make changes to medical advance directive? No - Patient declined  No - Patient declined  No - Patient declined No - Patient declined   Copy of HFranconiain Chart? No - copy requested  No - copy requested No - copy requested No - copy requested No - copy requested No - copy requested  Would patient like information on creating a medical advance directive?       No - Patient declined    Current Medications (verified) Outpatient Encounter Medications as of 05/01/2022  Medication Sig   acyclovir (ZOVIRAX) 200 MG capsule TAKE 1 CAPSULE BY MOUTH THREE TIMES A DAY    anastrozole (ARIMIDEX) 1 MG tablet TAKE 1 TABLET TWICE A WEEK BY MOUTH   Cholecalciferol (VITAMIN D-3) 125 MCG (5000 UT) TABS Take 125 mcg by mouth daily.   diclofenac Sodium (VOLTAREN) 1 % GEL Apply 2 g topically 4 (four) times daily.   ezetimibe (ZETIA) 10 MG tablet Take 1 tablet (10 mg total) by mouth daily.   finasteride (PROSCAR) 5 MG tablet TAKE 1 TABLET BY MOUTH EVERY DAY   fluticasone (FLONASE) 50 MCG/ACT nasal spray SPRAY 2 SPRAYS INTO EACH NOSTRIL EVERY DAY   hydrALAZINE (APRESOLINE) 25 MG tablet TAKE 1 TABLET BY MOUTH THREE TIMES A DAY   ipratropium (ATROVENT) 0.03 % nasal spray Place 2 sprays into both nostrils 3 (three) times daily as needed for rhinitis.   levocetirizine (XYZAL) 5 MG tablet TAKE 1 TABLET BY MOUTH EVERY DAY IN THE EVENING   liothyronine (CYTOMEL) 25 MCG tablet TAKE 1 TABLET BY MOUTH EVERY DAY IN THE MORNING   montelukast (SINGULAIR) 10 MG tablet TAKE 1 TABLET BY MOUTH EVERYDAY AT BEDTIME   Nandrolone Decanoate 200 MG/ML OIL Inject 100 mg as directed once a week.   omeprazole (PRILOSEC) 20 MG capsule Take 20 mg by mouth daily.   Pitavastatin Calcium (LIVALO) 4 MG TABS Take 1 tablet (4 mg total) by mouth daily.   Simethicone 40 MG/0.6ML LIQD Take 0.6 mLs (40 mg total) by mouth 4 (four) times daily.   Somatropin (OMNITROPE) 5.8 MG SOLR ADD  1.14 ML DILUENT PER VIAL AND INJECT 0.15ML SUBCUTANEOUSLY MONDAY THROUGH FRIDAY AT BEDTIME. REFRIGERATE DO NOT FREEZE.   SYRINGE-NEEDLE, DISP, 3 ML 23G X 1" 3 ML MISC Use to inject testosterone as prescribed.   tamsulosin (FLOMAX) 0.4 MG CAPS capsule TAKE 1 CAPSULE BY MOUTH EVERY DAY IN THE MORNING   testosterone cypionate (DEPOTESTOSTERONE CYPIONATE) 200 MG/ML injection INJECT 1.25 MLS (250 MG TOTAL) INTO THE MUSCLE EVERY 7 (SEVEN) DAYS.   thyroid (ARMOUR THYROID) 60 MG tablet Take 1 tablet (60 mg total) by mouth daily before breakfast.   umeclidinium-vilanterol (ANORO ELLIPTA) 62.5-25 MCG/ACT AEPB TAKE 1 PUFF BY MOUTH EVERY DAY    No facility-administered encounter medications on file as of 05/01/2022.    Allergies (verified) Morphine and related, Bactrim [sulfamethoxazole-trimethoprim], Statins, Sulfamethoxazole, and Trimethoprim   History: Past Medical History:  Diagnosis Date   Arthritis    Basal cell carcinoma 02/17/2020   infl & ulcerated-Left forehead (MOHS)   Coronary artery disease    GERD (gastroesophageal reflux disease)    H/O bronchitis    H/O hiatal hernia    History of kidney stones last in 1976   Hyperlipidemia    statin intolerant, under control   Hypertension    under control   Hypertensive retinopathy    OU   Hypothyroidism    Moderate persistent asthma without complication 02/05/2354   Retinal detachment    OD   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   left helix tx cx3 50f   Sleep apnea    hx of, surgery to reverse   Past Surgical History:  Procedure Laterality Date   CARDIAC CATHETERIZATION Right 2008   5 heart stents   CATARACT EXTRACTION Bilateral 6 years ago   CYSTOSCOPY N/A 08/06/2012   Procedure: CYSTOSCOPY;  Surgeon: JMalka So MD;  [facility];  Service: Urology;  Laterality: N/A;   ESOPHAGOGASTRIC FUNDOPLICATION  17322  spleen removed, 5 pints of blood given   EYE SURGERY     HERNIA REPAIR  1985   x4   INGUINAL HERNIA REPAIR  1992   PACEMAKER IMPLANT N/A 10/30/2018   Procedure: PACEMAKER IMPLANT;  Surgeon: CConstance Haw MD;  [facility];  Service: Cardiovascular;  Laterality: N/A;   PACEMAKER INSERTION     PPM GENERATOR REMOVAL N/A 10/25/2018   Procedure: PPM GENERATOR REMOVAL;  Surgeon: CConstance Haw MD;  [facility];  Service: Cardiovascular;  Laterality: N/A;   PROSTATE ABLATION  2013   "laser ablation"   RETINAL DETACHMENT SURGERY Right 01/24/2019   Pneumatic cryopexy - Dr. BBernarda Caffey  TEE WITHOUT CARDIOVERSION N/A 10/25/2018   Procedure: TRANSESOPHAGEAL ECHOCARDIOGRAM (TEE);  Surgeon:  NAcie FredricksonPWonda Cheng MD;  [facility];  Service: Cardiovascular;  Laterality: N/A;   TEMPORARY PACEMAKER N/A 10/25/2018   Procedure: TEMPORARY PACEMAKER;  Surgeon: CConstance Haw MD;  [facility];  Service: Cardiovascular;  Laterality: N/A;   TONSILLECTOMY  as child   TOTAL KNEE ARTHROPLASTY Right 08/03/2014   Procedure: TOTAL RIGHT KNEE ARTHROPLASTY;  Surgeon: FGearlean Alf MD;  [facility];  Service: Orthopedics;  Laterality: Right;   TRANSURETHRAL RESECTION OF PROSTATE N/A 08/06/2012   Procedure: FAlmyra FreeOF PROSTATE WITH GYRUS ;  Surgeon: JMalka So MD;  [facility];  Service: Urology;  Laterality: N/A;   UMBILICAL HERNIA REPAIR  1990   UVULECTOMY  2005   for sleep apnea   UVULOPALATOPHARYNGOPLASTY  6  years ago   Family History  Problem Relation Age of Onset   Heart attack Father 26       lived to 21   Alzheimer's disease Mother    Stroke Mother    Breast cancer Mother    Alzheimer's disease Maternal Grandmother    Stroke Maternal Grandmother    Lung cancer Maternal Grandmother    Lung cancer Maternal Grandfather    Cancer Paternal Grandmother        type unknown   Diabetes Paternal Grandfather    Other Paternal Grandfather        Growth in the back of the head   Social History   Socioeconomic History   Marital status: Married    Spouse name: Almyra Free   Number of children: 0   Years of education: Not on file   Highest education level: Not on file  Occupational History   Occupation: Retired   Tobacco Use   Smoking status: Never   Smokeless tobacco: Never  Vaping Use   Vaping Use: Never used  Substance and Sexual Activity   Alcohol use: Yes    Alcohol/week: 4.0 standard drinks of alcohol    Types: 4 Standard drinks or equivalent per week    Comment: 3 drinks at night    Drug use: No   Sexual activity: Not Currently  Other Topics Concern   Not on file  Social History Narrative   Not on file   Social Determinants of  Health   Financial Resource Strain: Low Risk  (05/01/2022)   Overall Financial Resource Strain (CARDIA)    Difficulty of Paying Living Expenses: Not hard at all  Food Insecurity: No Food Insecurity (05/01/2022)   Hunger Vital Sign    Worried About Running Out of Food in the Last Year: Never true    Ran Out of Food in the Last Year: Never true  Transportation Needs: No Transportation Needs (05/01/2022)   PRAPARE - Hydrologist (Medical): No    Lack of Transportation (Non-Medical): No  Physical Activity: Sufficiently Active (05/01/2022)   Exercise Vital Sign    Days of Exercise per Week: 5 days    Minutes of Exercise per Session: 30 min  Stress: No Stress Concern Present (05/01/2022)   [place]    Feeling of Stress : Not at all  Social Connections: Moderately Integrated (05/01/2022)   Social Connection and Isolation Panel [NHANES]    Frequency of Communication with Friends and Family: More than three times a week    Frequency of Social Gatherings with Friends and Family: More than three times a week    Attends Religious Services: Never    Marine scientist or Organizations: Yes    Attends Music therapist: More than 4 times per year    Marital Status: Married    Tobacco Counseling Counseling given: Not Answered   Clinical Intake:  Pre-visit preparation completed: Yes  Pain : 0-10 Pain Score: 3  Pain Type: Chronic pain Pain Location: Back Pain Descriptors / Indicators: Aching, Discomfort, Dull Pain Relieving Factors: medication  Pain Relieving Factors: medication  Nutritional Status: BMI 25 -29 Overweight Nutritional Risks: Other (Comment) Diabetes: No  How often do you need to have someone help you when you read instructions, pamphlets, or other written materials from your doctor or pharmacy?: 1 - Never  Diabetic?No  Interpreter Needed?:  No  Information entered by :: Emelia Loron RN  Activities of Daily Living    05/01/2022    2:18 PM  In your present state of health, do you have any difficulty performing the following activities:  Hearing? 0  Vision? 0  Difficulty concentrating or making decisions? 0  Walking or climbing stairs? 0  Dressing or bathing? 0  Doing errands, shopping? 0  Preparing Food and eating ? N  Using the Toilet? N  In the past six months, have you accidently leaked urine? N  Do you have problems with loss of bowel control? N  Managing your Medications? N  Managing your Finances? N    Patient Care Team: [name], MD as PCP - General (Internal Medicine) [name], MD as PCP - Cardiology (Cardiology) Lavonna Monarch, MD (Inactive) as Consulting Physician (Dermatology)  Indicate any recent Medical Services you may have received from other than [facility] providers in the past year (date may be approximate).     Assessment:   This is a routine wellness examination for Rogerio.  Hearing/Vision screen No results found.  Dietary issues and exercise activities discussed: Current Exercise Habits: Home exercise routine, Type of exercise: walking, Time (Minutes): 40, Frequency (Times/Week): 5, Weekly Exercise (Minutes/Week): 200, Intensity: Mild, Exercise limited by: orthopedic condition(s)   Goals Addressed             This Visit's Progress    Patient Stated       Maintain current health status.       Depression Screen    05/01/2022    2:12 PM 04/25/2021    9:56 AM 04/25/2021    9:53 AM 02/10/2020    2:40 PM 12/22/2019   11:29 AM 01/02/2019   11:09 AM 06/27/2018    3:46 PM  PHQ 2/9 Scores  PHQ - 2 Score 0 0 0 0 0 0 0    Fall Risk    05/01/2022    2:26 PM 03/20/2022    1:32 PM 04/25/2021    9:56 AM 02/10/2020    2:42 PM 12/22/2019   11:29 AM  Fall Risk   Falls in the past year? 0 0 0 0 0  Number falls in past yr: 0  0 0 0  Injury with Fall? 0  0 0 0  Risk for fall due  to : No Fall Risks   No Fall Risks No Fall Risks  Follow up Falls evaluation completed  Falls evaluation completed Falls evaluation completed;Education provided Falls evaluation completed    [place]:  Any stairs in or around the home? Yes  If so, are there any without handrails? Yes  Home free of loose throw rugs in walkways, pet beds, electrical cords, etc? Yes  Adequate lighting in your home to reduce risk of falls? Yes   ASSISTIVE DEVICES UTILIZED TO PREVENT FALLS:  Life alert? No  Use of a cane, walker or w/c? No  Grab bars in the bathroom? Yes  Shower chair or bench in shower? No  Elevated toilet seat or a handicapped toilet? No   Cognitive Function:    05/01/2022    2:23 PM  MMSE - Mini Mental State Exam  Not completed: Refused        02/10/2020    2:44 PM  6CIT Screen  What Year? 0 points  What month? 0 points  What time? 0 points  Count back from 20 0 points  Months in reverse 0 points  Repeat phrase 0 points  Total Score 0  points    Immunizations Immunization History  Administered Date(s) Administered   Fluad Quad(high Dose 65+) 04/01/2019, 03/22/2020   Influenza, High Dose Seasonal PF 07/13/2016, 04/19/2017   Influenza-Unspecified 05/04/2021   PFIZER(Purple Top)SARS-COV-2 Vaccination 06/24/2019, 07/15/2019   Pfizer Covid-19 Vaccine Bivalent Booster 5y-11y 05/04/2021   Pneumococcal Conjugate-13 06/26/2017   Pneumococcal Polysaccharide-23 12/16/2015, 01/17/2022   Tdap 06/26/2017   Zoster Recombinat (Shingrix) 06/14/2019, 10/20/2019    Flu Vaccine status: Due, Education has been provided regarding the importance of this vaccine. Advised may receive this vaccine at local pharmacy or Health Dept. Aware to provide a copy of the vaccination record if obtained from local pharmacy or Health Dept. Verbalized acceptance and understanding.  Pneumococcal vaccine status: Up to date  Covid-19 vaccine status: Information provided  on how to obtain vaccines.   Qualifies for Shingles Vaccine? Yes   Zostavax completed No   Shingrix Completed?: Yes  Screening Tests Health Maintenance  Topic Date Due   COVID-19 Vaccine (4 - 2023-24 season) 02/03/2022   INFLUENZA VACCINE  09/03/2022 (Originally 01/03/2022)   Medicare Annual Wellness (AWV)  05/02/2023   Pneumonia Vaccine 59+ Years old  Completed   Zoster Vaccines- Shingrix  Completed   HPV VACCINES  Aged Out    Health Maintenance  Health Maintenance Due  Topic Date Due   COVID-19 Vaccine (4 - 2023-24 season) 02/03/2022    Colorectal cancer screening: No longer required.   Lung Cancer Screening: (Low Dose CT Chest recommended if Age 1-80 years, 30 pack-year currently smoking OR have quit w/in 15years.) does not qualify.   Additional Screening:  Hepatitis C Screening: does qualify; Completed education provided  Vision Screening: Recommended annual ophthalmology exams for early detection of glaucoma and other disorders of the eye. Is the patient up to date with their annual eye exam?  Yes  Who is the provider or what is the name of the office in which the patient attends annual eye exams? Dr. Herbert Dean[facility]  If pt is not established with a provider, would they like to be referred to a provider to establish care?  N/A .   Dental Screening: Recommended annual dental exams for proper oral hygiene  Community Resource Referral / Chronic Care Management: CRR required this visit?  No   CCM required this visit?  No      Plan:     I have personally reviewed and noted the following in the patient's chart:   Medical and social history Use of alcohol, tobacco or illicit drugs  Current medications and supplements including opioid prescriptions. Patient is not currently taking opioid prescriptions. Functional ability and status Nutritional status Physical activity Advanced directives List of other physicians Hospitalizations, surgeries, and ER visits in  previous 12 months Vitals Screenings to include cognitive, depression, and falls Referrals and appointments  In addition, I have reviewed and discussed with patient certain preventive protocols, quality metrics, and best practice recommendations. A written personalized care plan for preventive services as well as general preventive health recommendations were provided to patient.     Michiel Cowboy, RN   05/01/2022   Nurse Notes:  Mr. Ritthaler , Thank you for taking time to come for your Medicare Wellness Visit. I appreciate your ongoing commitment to your health goals. Please review the following plan we discussed and let me know if I can assist you in the future.   These are the goals we discussed:  Goals       Patient Stated     Patient  Stated (pt-stated)      My goal is to lose 15 pounds.      Other     Patient Stated      Maintain current health status.         This is a list of the screening recommended for you and due dates:  Health Maintenance  Topic Date Due   COVID-19 Vaccine (4 - 2023-24 season) 02/03/2022   Flu Shot  09/03/2022*   Medicare Annual Wellness Visit  05/02/2023   Pneumonia Vaccine  Completed   Zoster (Shingles) Vaccine  Completed   HPV Vaccine  Aged Out  *Topic was postponed. The date shown is not the original due date.

## 2022-05-01 NOTE — Telephone Encounter (Signed)
Form given to PCP to sign 

## 2022-05-01 NOTE — Patient Instructions (Signed)
Health Maintenance, Male Adopting a healthy lifestyle and getting preventive care are important in promoting health and wellness. Ask your health care provider about: The right schedule for you to have regular tests and exams. Things you can do on your own to prevent diseases and keep yourself healthy. What should I know about diet, weight, and exercise? Eat a healthy diet  Eat a diet that includes plenty of vegetables, fruits, low-fat dairy products, and lean protein. Do not eat a lot of foods that are high in solid fats, added sugars, or sodium. Maintain a healthy weight Body mass index (BMI) is a measurement that can be used to identify possible weight problems. It estimates body fat based on height and weight. Your health care provider can help determine your BMI and help you achieve or maintain a healthy weight. Get regular exercise Get regular exercise. This is one of the most important things you can do for your health. Most adults should: Exercise for at least 150 minutes each week. The exercise should increase your heart rate and make you sweat (moderate-intensity exercise). Do strengthening exercises at least twice a week. This is in addition to the moderate-intensity exercise. Spend less time sitting. Even light physical activity can be beneficial. Watch cholesterol and blood lipids Have your blood tested for lipids and cholesterol at 81 years of age, then have this test every 5 years. You may need to have your cholesterol levels checked more often if: Your lipid or cholesterol levels are high. You are older than 81 years of age. You are at high risk for heart disease. What should I know about cancer screening? Many types of cancers can be detected early and may often be prevented. Depending on your health history and family history, you may need to have cancer screening at various ages. This may include screening for: Colorectal cancer. Prostate cancer. Skin cancer. Lung  cancer. What should I know about heart disease, diabetes, and high blood pressure? Blood pressure and heart disease High blood pressure causes heart disease and increases the risk of stroke. This is more likely to develop in people who have high blood pressure readings or are overweight. Talk with your health care provider about your target blood pressure readings. Have your blood pressure checked: Every 3-5 years if you are 18-39 years of age. Every year if you are 40 years old or older. If you are between the ages of 65 and 75 and are a current or former smoker, ask your health care provider if you should have a one-time screening for abdominal aortic aneurysm (AAA). Diabetes Have regular diabetes screenings. This checks your fasting blood sugar level. Have the screening done: Once every three years after age 45 if you are at a normal weight and have a low risk for diabetes. More often and at a younger age if you are overweight or have a high risk for diabetes. What should I know about preventing infection? Hepatitis B If you have a higher risk for hepatitis B, you should be screened for this virus. Talk with your health care provider to find out if you are at risk for hepatitis B infection. Hepatitis C Blood testing is recommended for: Everyone born from 1945 through 1965. Anyone with known risk factors for hepatitis C. Sexually transmitted infections (STIs) You should be screened each year for STIs, including gonorrhea and chlamydia, if: You are sexually active and are younger than 81 years of age. You are older than 81 years of age and your   health care provider tells you that you are at risk for this type of infection. Your sexual activity has changed since you were last screened, and you are at increased risk for chlamydia or gonorrhea. Ask your health care provider if you are at risk. Ask your health care provider about whether you are at high risk for HIV. Your health care provider  may recommend a prescription medicine to help prevent HIV infection. If you choose to take medicine to prevent HIV, you should first get tested for HIV. You should then be tested every 3 months for as long as you are taking the medicine. Follow these instructions at home: Alcohol use Do not drink alcohol if your health care provider tells you not to drink. If you drink alcohol: Limit how much you have to 0-2 drinks a day. Know how much alcohol is in your drink. In the [place] one drink equals one 12 oz bottle of beer (355 mL), one 5 oz glass of Hadiyah Maricle (148 mL), or one 1 oz glass of hard liquor (44 mL). Lifestyle Do not use any products that contain nicotine or tobacco. These products include cigarettes, chewing tobacco, and vaping devices, such as e-cigarettes. If you need help quitting, ask your health care provider. Do not use street drugs. Do not share needles. Ask your health care provider for help if you need support or information about quitting drugs. General instructions Schedule regular health, dental, and eye exams. Stay current with your vaccines. Tell your health care provider if: You often feel depressed. You have ever been abused or do not feel safe at home. Summary Adopting a healthy lifestyle and getting preventive care are important in promoting health and wellness. Follow your health care provider's instructions about healthy diet, exercising, and getting tested or screened for diseases. Follow your health care provider's instructions on monitoring your cholesterol and blood pressure. This information is not intended to replace advice given to you by your health care provider. Make sure you discuss any questions you have with your health care provider. Document Revised: 10/11/2020 Document Reviewed: 10/11/2020 Elsevier Patient Education  2023 Elsevier Inc.  

## 2022-05-01 NOTE — Therapy (Signed)
[name] Glass OUTPATIENT PHYSICAL THERAPY CERVICAL EVALUATION   Patient Name: [name] Glass MRN: [id number] DOB:1940/09/09, 81 y.o., male Today's Date: 05/01/2022   PT End of Session - 05/01/22 1144     Visit Number 4    Number of Visits 12    Date for PT Re-Evaluation 07/09/22    Authorization Type FOTO AT LEAST EVERY 5TH VISIT.  PROGRESS NOTE AT 10TH VISIT.  KX MODIFIER AFTER 15 VISITS.    PT Start Time 1030    PT Stop Time 1110    PT Time Calculation (min) 40 min    Activity Tolerance Patient tolerated treatment well    Behavior During Therapy WFL for tasks assessed/performed             Past Medical History:  Diagnosis Date   Arthritis    Basal cell carcinoma 02/17/2020   infl & ulcerated-Left forehead (MOHS)   Coronary artery disease    GERD (gastroesophageal reflux disease)    H/O bronchitis    H/O hiatal hernia    History of kidney stones last in 1976   Hyperlipidemia    statin intolerant, under control   Hypertension    under control   Hypertensive retinopathy    OU   Hypothyroidism    Moderate persistent asthma without complication 12/06/1636   Retinal detachment    OD   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   left helix tx cx3 67f   Sleep apnea    hx of, surgery to reverse   Past Surgical History:  Procedure Laterality Date   CARDIAC CATHETERIZATION Right 2008   5 heart stents   CATARACT EXTRACTION Bilateral 6 years ago   CYSTOSCOPY N/A 08/06/2012   Procedure: CYSTOSCOPY;  Surgeon: JMalka So MD;  Location: [facility];  Service: Urology;  Laterality: N/A;   ESOPHAGOGASTRIC FUNDOPLICATION  14536  spleen removed, 5 pints of blood given   EYE SURGERY     HERNIA REPAIR  1985   x4   INGUINAL HERNIA REPAIR  1992   PACEMAKER IMPLANT N/A 10/30/2018   Procedure: PACEMAKER IMPLANT;  Surgeon: CConstance Haw MD;  [facility];  Service: Cardiovascular;  Laterality: N/A;   PACEMAKER INSERTION     PPM GENERATOR REMOVAL N/A 10/25/2018    Procedure: PPM GENERATOR REMOVAL;  Surgeon: CConstance Haw MD;  [place] [facility];  Service: Cardiovascular;  Laterality: N/A;   PROSTATE ABLATION  2013   "laser ablation"   RETINAL DETACHMENT SURGERY Right 01/24/2019   Pneumatic cryopexy - Dr. BBernarda Caffey  TEE WITHOUT CARDIOVERSION N/A 10/25/2018   Procedure: TRANSESOPHAGEAL ECHOCARDIOGRAM (TEE);  Surgeon: NAcie FredricksonPWonda Cheng MD;  [facility];  Service: Cardiovascular;  Laterality: N/A;   TEMPORARY PACEMAKER N/A 10/25/2018   Procedure: TEMPORARY PACEMAKER;  Surgeon: CConstance Haw MD;  [facility];  Service: Cardiovascular;  Laterality: N/A;   TONSILLECTOMY  as child   TOTAL KNEE ARTHROPLASTY Right 08/03/2014   Procedure: TOTAL RIGHT KNEE ARTHROPLASTY;  Surgeon: FGearlean Alf MD;  Location: [facility];  Service: Orthopedics;  Laterality: Right;   TRANSURETHRAL RESECTION OF PROSTATE N/A 08/06/2012   Procedure: FAlmyra FreeOF PROSTATE WITH GYRUS ;  Surgeon: JMalka So MD;  [facility]: [facility];  Service: Urology;  Laterality: N/A;   UMBILICAL HERNIA REPAIR  1990   UVULECTOMY  2005   for sleep apnea   UVULOPALATOPHARYNGOPLASTY  6 years ago   Patient Active Problem List  Diagnosis Date Noted   Finger pain, right 04/17/2022   Cervico-occipital neuralgia of right side 04/17/2022   Chronic tension-type headache, not intractable 02/27/2022   Postural kyphosis of cervicothoracic region 02/27/2022   Moderate aortic stenosis 10/27/2020   Thoracic aortic aneurysm ([place]) 03/12/2020   Sinus node dysfunction (HCC) 02/17/2020   Moderate persistent asthma without complication 40/98/1191   Chronic bronchitis, mucopurulent ([place]) 06/09/2019   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   Cervicalgia 04/01/2019   Degenerative disc disease, cervical 04/01/2019   Hypothyroidism    Presence of permanent cardiac pacemaker 10/23/2018   Sleep apnea 10/23/2018   PSA elevation 06/26/2017   Benign prostatic hyperplasia without  lower urinary tract symptoms 05/21/2017   Seasonal allergic rhinitis due to pollen 05/18/2017   Chronic renal disease, stage 3, moderately decreased glomerular filtration rate (GFR) between 30-59 mL/min/1.73 square meter ([place]) 47/82/9562   Systolic murmur 13/01/6577   Gastroesophageal reflux disease without esophagitis 12/19/2015   Essential hypertension, benign 12/19/2015   Hyperlipidemia with target LDL less than 70 12/19/2015   OA (osteoarthritis) of knee 08/03/2014   Coronary artery disease 07/10/2014     REFERRING PROVIDER: Durel Salts  REFERRING DIAG: Myofascial neck pain  THERAPY DIAG:  Cervicalgia  Abnormal posture  Rationale for Evaluation and Treatment: Rehabilitation  ONSET DATE: ~2 years.  SUBJECTIVE:                                                                                                                                                                                                         SUBJECTIVE STATEMENT: Therapy is helping.  PERTINENT HISTORY:  PACEMAKER, HTN, COPD, right TKA, basal cell carcinoma (recent removal from right upper back region).  PAIN:  Are you having pain? Yes: NPRS scale: 2/10 Pain location: Right cervical Pain description: Ache, sharp. Aggravating factors: As above. Relieving factors: As above.  PRECAUTIONS: ICD/Pacemaker  PLOF: Independent  PATIENT GOALS: Reduce pain and headaches which will improve quality of life.  NEXT MD VISIT:   OBJECTIVE:  Trigger Point Dry-Needling  Treatment instructions: Expect mild to moderate muscle soreness. S/S of pneumothorax if dry needled over a lung field, and to seek immediate medical attention should they occur. Patient verbalized understanding of these instructions and education.  Patient Consent Given: Yes Education handout provided: Yes Muscles treated: Splenius capitus/cervicis.  Treatment response/outcome: Twitch  f/b STW/M x 23 minutes to patient's affected cervical  paraspinal musculature and suboccipital region f/b HMP x 10 minutes.  ASSESSMENT:  CLINICAL IMPRESSION: Patient is pleased with his response to PT thus far and his pain has been  consistently lower as of late. GOALS:  SHORT TERM GOALS: Target date: 05/15/2022   Ind with a HEP. Goal status: INITIAL   LONG TERM GOALS: Target date: 06/12/2022  Bilateral active cervical rotation to 60 degrees so he can turn head more easily while driving. Goal status: INITIAL  2.  Eliminate headaches. Goal status: INITIAL  3.  Sit/stand 20 minutes with pain not > 2-3/10. Goal status: INITIAL   PLAN:  PT FREQUENCY: 2x/week  PT DURATION: 6 weeks  PLANNED INTERVENTIONS: Therapeutic exercises, Therapeutic activity, Patient/Family education, Self Care, Dry Needling, Cryotherapy, Moist heat, Traction, and Manual therapy  PLAN FOR NEXT SESSION: STW/M, suboccipital release technique, manual traction with progression to intermittent traction beginning at 13#, postural exercises, dry needling.   Derrius Furtick, [place], PT 05/01/2022, 11:45 AM

## 2022-05-04 DIAGNOSIS — R1310 Dysphagia, unspecified: Secondary | ICD-10-CM | POA: Diagnosis not present

## 2022-05-06 ENCOUNTER — Other Ambulatory Visit: Payer: Self-pay | Admitting: Internal Medicine

## 2022-05-08 ENCOUNTER — Ambulatory Visit: Payer: Medicare Other | Admitting: Physical Therapy

## 2022-05-08 DIAGNOSIS — J387 Other diseases of larynx: Secondary | ICD-10-CM | POA: Diagnosis not present

## 2022-05-08 DIAGNOSIS — R9389 Abnormal findings on diagnostic imaging of other specified body structures: Secondary | ICD-10-CM | POA: Diagnosis not present

## 2022-05-08 DIAGNOSIS — R1313 Dysphagia, pharyngeal phase: Secondary | ICD-10-CM | POA: Diagnosis not present

## 2022-05-08 DIAGNOSIS — J392 Other diseases of pharynx: Secondary | ICD-10-CM | POA: Diagnosis not present

## 2022-05-09 ENCOUNTER — Encounter: Payer: Self-pay | Admitting: Physical Therapy

## 2022-05-09 ENCOUNTER — Ambulatory Visit: Payer: Medicare Other | Attending: Physical Medicine and Rehabilitation | Admitting: Physical Therapy

## 2022-05-09 DIAGNOSIS — R293 Abnormal posture: Secondary | ICD-10-CM | POA: Insufficient documentation

## 2022-05-09 DIAGNOSIS — M542 Cervicalgia: Secondary | ICD-10-CM | POA: Insufficient documentation

## 2022-05-09 NOTE — Therapy (Signed)
[name] OUTPATIENT PHYSICAL THERAPY CERVICAL EVALUATION   Patient Name: [name] Glass MRN: [id number] DOB:[birth year], 81 y.o., male Today's Date: 05/09/2022   PT End of Session - 05/09/22 1411     Visit Number 5    Number of Visits 12    Date for PT Re-Evaluation 07/09/22    Authorization Type FOTO AT LEAST EVERY 5TH VISIT.  PROGRESS NOTE AT 10TH VISIT.  KX MODIFIER AFTER 15 VISITS.    PT Start Time 0135    PT Stop Time 0214    PT Time Calculation (min) 39 min    Activity Tolerance Patient tolerated treatment well    Behavior During Therapy [facility] for tasks assessed/performed             Past Medical History:  Diagnosis Date   Arthritis    Basal cell carcinoma 02/17/2020   infl & ulcerated-Left forehead (MOHS)   Coronary artery disease    GERD (gastroesophageal reflux disease)    H/O bronchitis    H/O hiatal hernia    History of kidney stones last in 1976   Hyperlipidemia    statin intolerant, under control   Hypertension    under control   Hypertensive retinopathy    OU   Hypothyroidism    Moderate persistent asthma without complication 01/10/4165   Retinal detachment    OD   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   left helix tx cx3 45f   Sleep apnea    hx of, surgery to reverse   Past Surgical History:  Procedure Laterality Date   CARDIAC CATHETERIZATION Right 2008   5 heart stents   CATARACT EXTRACTION Bilateral 6 years ago   CYSTOSCOPY N/A 08/06/2012   Procedure: CYSTOSCOPY;  Surgeon: JMalka So MD;  [facility];  Service: Urology;  Laterality: N/A;   ESOPHAGOGASTRIC FUNDOPLICATION  10630  spleen removed, 5 pints of blood given   EYE SURGERY     HERNIA REPAIR  1985   x4   INGUINAL HERNIA REPAIR  1992   PACEMAKER IMPLANT N/A 10/30/2018   Procedure: PACEMAKER IMPLANT;  Surgeon: CConstance Haw MD;  [facility];  Service: Cardiovascular;  Laterality: N/A;   PACEMAKER INSERTION     PPM GENERATOR REMOVAL N/A 10/25/2018    Procedure: PPM GENERATOR REMOVAL;  Surgeon: CConstance Haw MD;  [facility];  Service: Cardiovascular;  Laterality: N/A;   PROSTATE ABLATION  2013   "laser ablation"   RETINAL DETACHMENT SURGERY Right 01/24/2019   Pneumatic cryopexy - Dr. BBernarda Caffey  TEE WITHOUT CARDIOVERSION N/A 10/25/2018   Procedure: TRANSESOPHAGEAL ECHOCARDIOGRAM (TEE);  Surgeon: NAcie FredricksonPWonda Cheng MD;  [facility];  Service: Cardiovascular;  Laterality: N/A;   TEMPORARY PACEMAKER N/A 10/25/2018   Procedure: TEMPORARY PACEMAKER;  Surgeon: CConstance Haw MD;  [facility];  Service: Cardiovascular;  Laterality: N/A;   TONSILLECTOMY  as child   TOTAL KNEE ARTHROPLASTY Right 08/03/2014   Procedure: TOTAL RIGHT KNEE ARTHROPLASTY;  Surgeon: FGearlean Alf MD;  [facility];  Service: Orthopedics;  Laterality: Right;   TRANSURETHRAL RESECTION OF PROSTATE N/A 08/06/2012   Procedure: FAlmyra FreeOF PROSTATE WITH GYRUS ;  Surgeon: JMalka So MD;  [facility];  Service: Urology;  Laterality: N/A;   UMBILICAL HERNIA REPAIR  1990   UVULECTOMY  2005   for sleep apnea   UVULOPALATOPHARYNGOPLASTY  6 years ago   Patient Active Problem List  Diagnosis Date Noted   Finger pain, right 04/17/2022   Cervico-occipital neuralgia of right side 04/17/2022   Chronic tension-type headache, not intractable 02/27/2022   Postural kyphosis of cervicothoracic region 02/27/2022   Moderate aortic stenosis 10/27/2020   Thoracic aortic aneurysm ([place]) 03/12/2020   Sinus node dysfunction (HCC) 02/17/2020   Moderate persistent asthma without complication 17/61/6073   Chronic bronchitis, mucopurulent ([place]) 06/09/2019   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   Cervicalgia 04/01/2019   Degenerative disc disease, cervical 04/01/2019   Hypothyroidism    Presence of permanent cardiac pacemaker 10/23/2018   Sleep apnea 10/23/2018   PSA elevation 06/26/2017   Benign prostatic hyperplasia without  lower urinary tract symptoms 05/21/2017   Seasonal allergic rhinitis due to pollen 05/18/2017   Chronic renal disease, stage 3, moderately decreased glomerular filtration rate (GFR) between 30-59 mL/min/1.73 square meter ([place]) 71/11/2692   Systolic murmur 85/46/2703   Gastroesophageal reflux disease without esophagitis 12/19/2015   Essential hypertension, benign 12/19/2015   Hyperlipidemia with target LDL less than 70 12/19/2015   OA (osteoarthritis) of knee 08/03/2014   Coronary artery disease 07/10/2014     REFERRING PROVIDER: Durel Salts  REFERRING DIAG: Myofascial neck pain  THERAPY DIAG:  Cervicalgia  Abnormal posture  Rationale for Evaluation and Treatment: Rehabilitation  ONSET DATE: ~2 years.  SUBJECTIVE:                                                                                                                                                                                                         SUBJECTIVE STATEMENT: No headache. PERTINENT HISTORY:  PACEMAKER, HTN, COPD, right TKA, basal cell carcinoma (recent removal from right upper back region).  PAIN:  Are you having pain? Yes: NPRS scale: 2/10 Pain location: Right cervical Pain description: Ache, sharp. Aggravating factors: As above. Relieving factors: As above.  PRECAUTIONS: ICD/Pacemaker  PLOF: Independent  PATIENT GOALS: Reduce pain and headaches which will improve quality of life.  NEXT MD VISIT:   OBJECTIVE:  Trigger Point Dry-Needling  Treatment instructions: Expect mild to moderate muscle soreness. S/S of pneumothorax if dry needled over a lung field, and to seek immediate medical attention should they occur. Patient verbalized understanding of these instructions and education.  Patient Consent Given: Yes Education handout provided: Yes Muscles treated: Splenius capitus/cervicis.  Treatment response/outcome: Twitch  f/b STW/M x 23 minutes to patient's affected cervical paraspinal  musculature, suboccipital and right upper thoracic region f/b HMP x 10 minutes.  ASSESSMENT:  CLINICAL IMPRESSION: Patient is pleased with his response to PT thus far and reports no headache  today.  He had some pain in his right upper thoracic region and responded very well to STW/M. GOALS:  SHORT TERM GOALS: Target date: 05/23/2022   Ind with a HEP. Goal status: INITIAL   LONG TERM GOALS: Target date: 06/20/2022  Bilateral active cervical rotation to 60 degrees so he can turn head more easily while driving. Goal status: INITIAL  2.  Eliminate headaches. Goal status: INITIAL  3.  Sit/stand 20 minutes with pain not > 2-3/10. Goal status: INITIAL   PLAN:  PT FREQUENCY: 2x/week  PT DURATION: 6 weeks  PLANNED INTERVENTIONS: Therapeutic exercises, Therapeutic activity, Patient/Family education, Self Care, Dry Needling, Cryotherapy, Moist heat, Traction, and Manual therapy  PLAN FOR NEXT SESSION: STW/M, suboccipital release technique, manual traction with progression to intermittent traction beginning at 13#, postural exercises, dry needling.   Ralene Gasparyan, [place], PT 05/09/2022, 2:19 PM

## 2022-05-15 ENCOUNTER — Ambulatory Visit: Payer: Medicare Other | Admitting: Physical Therapy

## 2022-05-15 DIAGNOSIS — M542 Cervicalgia: Secondary | ICD-10-CM | POA: Diagnosis not present

## 2022-05-15 DIAGNOSIS — R293 Abnormal posture: Secondary | ICD-10-CM

## 2022-05-15 NOTE — Therapy (Signed)
[name] OUTPATIENT PHYSICAL THERAPY CERVICAL EVALUATION   Patient Name: [name] Glass MRN: [id number] DOB:08-Jun-1940, 81 y.o., male Today's Date: 05/09/2022   PT End of Session - 05/09/22 1411     Visit Number 5    Number of Visits 12    Date for PT Re-Evaluation 07/09/22    Authorization Type FOTO AT LEAST EVERY 5TH VISIT.  PROGRESS NOTE AT 10TH VISIT.  KX MODIFIER AFTER 15 VISITS.    PT Start Time 0135    PT Stop Time 0214    PT Time Calculation (min) 39 min    Activity Tolerance Patient tolerated treatment well    Behavior During Therapy [facility] for tasks assessed/performed             Past Medical History:  Diagnosis Date   Arthritis    Basal cell carcinoma 02/17/2020   infl & ulcerated-Left forehead (MOHS)   Coronary artery disease    GERD (gastroesophageal reflux disease)    H/O bronchitis    H/O hiatal hernia    History of kidney stones last in 1976   Hyperlipidemia    statin intolerant, under control   Hypertension    under control   Hypertensive retinopathy    OU   Hypothyroidism    Moderate persistent asthma without complication 06/11/7937   Retinal detachment    OD   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   left helix tx cx3 31f   Sleep apnea    hx of, surgery to reverse   Past Surgical History:  Procedure Laterality Date   CARDIAC CATHETERIZATION Right 2008   5 heart stents   CATARACT EXTRACTION Bilateral 6 years ago   CYSTOSCOPY N/A 08/06/2012   Procedure: CYSTOSCOPY;  Surgeon: JMalka So MD;  [facility];  Service: Urology;  Laterality: N/A;   ESOPHAGOGASTRIC FUNDOPLICATION  10300  spleen removed, 5 pints of blood given   EYE SURGERY     HERNIA REPAIR  1985   x4   INGUINAL HERNIA REPAIR  1992   PACEMAKER IMPLANT N/A 10/30/2018   Procedure: PACEMAKER IMPLANT;  Surgeon: CConstance Haw MD;  [place] [facility];  Service: Cardiovascular;  Laterality: N/A;   PACEMAKER INSERTION     PPM GENERATOR REMOVAL N/A 10/25/2018    Procedure: PPM GENERATOR REMOVAL;  Surgeon: CConstance Haw MD;  [place];  Service: Cardiovascular;  Laterality: N/A;   PROSTATE ABLATION  2013   "laser ablation"   RETINAL DETACHMENT SURGERY Right 01/24/2019   Pneumatic cryopexy - Dr. BBernarda Caffey  TEE WITHOUT CARDIOVERSION N/A 10/25/2018   Procedure: TRANSESOPHAGEAL ECHOCARDIOGRAM (TEE);  Surgeon: NAcie FredricksonPWonda Cheng MD;  [facility];  Service: Cardiovascular;  Laterality: N/A;   TEMPORARY PACEMAKER N/A 10/25/2018   Procedure: TEMPORARY PACEMAKER;  Surgeon: CConstance Haw MD;  [place];  Service: Cardiovascular;  Laterality: N/A;   TONSILLECTOMY  as child   TOTAL KNEE ARTHROPLASTY Right 08/03/2014   Procedure: TOTAL RIGHT KNEE ARTHROPLASTY;  Surgeon: FGearlean Alf MD;  [facility];  Service: Orthopedics;  Laterality: Right;   TRANSURETHRAL RESECTION OF PROSTATE N/A 08/06/2012   Procedure: FAlmyra FreeOF PROSTATE WITH GYRUS ;  Surgeon: JMalka So MD;  [facility];  Service: Urology;  Laterality: N/A;   UMBILICAL HERNIA REPAIR  1990   UVULECTOMY  2005   for sleep apnea   UVULOPALATOPHARYNGOPLASTY  6 years ago   Patient Active Problem List  Diagnosis Date Noted   Finger pain, right 04/17/2022   Cervico-occipital neuralgia of right side 04/17/2022   Chronic tension-type headache, not intractable 02/27/2022   Postural kyphosis of cervicothoracic region 02/27/2022   Moderate aortic stenosis 10/27/2020   Thoracic aortic aneurysm ([place]) 03/12/2020   Sinus node dysfunction (HCC) 02/17/2020   Moderate persistent asthma without complication 08/65/7846   Chronic bronchitis, mucopurulent ([place]) 06/09/2019   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   Cervicalgia 04/01/2019   Degenerative disc disease, cervical 04/01/2019   Hypothyroidism    Presence of permanent cardiac pacemaker 10/23/2018   Sleep apnea 10/23/2018   PSA elevation 06/26/2017   Benign prostatic hyperplasia without  lower urinary tract symptoms 05/21/2017   Seasonal allergic rhinitis due to pollen 05/18/2017   Chronic renal disease, stage 3, moderately decreased glomerular filtration rate (GFR) between 30-59 mL/min/1.73 square meter ([place]) 96/29/5284   Systolic murmur 13/24/4010   Gastroesophageal reflux disease without esophagitis 12/19/2015   Essential hypertension, benign 12/19/2015   Hyperlipidemia with target LDL less than 70 12/19/2015   OA (osteoarthritis) of knee 08/03/2014   Coronary artery disease 07/10/2014     REFERRING PROVIDER: Durel Salts  REFERRING DIAG: Myofascial neck pain  THERAPY DIAG:  Cervicalgia  Abnormal posture  Rationale for Evaluation and Treatment: Rehabilitation  ONSET DATE: ~2 years.  SUBJECTIVE:                                                                                                                                                                                                         SUBJECTIVE STATEMENT: Any headaches have been minimal. PERTINENT HISTORY:  PACEMAKER, HTN, COPD, right TKA, basal cell carcinoma (recent removal from right upper back region).  PAIN:  Are you having pain? Yes: NPRS scale: 2/10 Pain location: Right cervical Pain description: Ache, sharp. Aggravating factors: As above. Relieving factors: As above.  PRECAUTIONS: ICD/Pacemaker  PLOF: Independent  PATIENT GOALS: Reduce pain and headaches which will improve quality of life.  NEXT MD VISIT:   OBJECTIVE:  Trigger Point Dry-Needling  Treatment instructions: Expect mild to moderate muscle soreness. S/S of pneumothorax if dry needled over a lung field, and to seek immediate medical attention should they occur. Patient verbalized understanding of these instructions and education.  Patient Consent Given: Yes Education handout provided: Yes Muscles treated: Splenius capitus/cervicis.  Treatment response/outcome: Twitch  f/b STW/M x 23 minutes to patient's affected  cervical paraspinal musculature, suboccipital and right upper thoracic region f/b HMP x 12 minutes.  ASSESSMENT:  CLINICAL IMPRESSION: Patient is pleased with his response to PT thus far and  reports headache intensity has been reduced significantly.  He had some pain in his right upper to mid thoracic region and responded very well to STW/M. GOALS:  SHORT TERM GOALS: Target date: 05/23/2022   Ind with a HEP. Goal status: INITIAL   LONG TERM GOALS: Target date: 06/20/2022  Bilateral active cervical rotation to 60 degrees so he can turn head more easily while driving. Goal status: INITIAL  2.  Eliminate headaches. Goal status: INITIAL  3.  Sit/stand 20 minutes with pain not > 2-3/10. Goal status: INITIAL   PLAN:  PT FREQUENCY: 2x/week  PT DURATION: 6 weeks  PLANNED INTERVENTIONS: Therapeutic exercises, Therapeutic activity, Patient/Family education, Self Care, Dry Needling, Cryotherapy, Moist heat, Traction, and Manual therapy  PLAN FOR NEXT SESSION: STW/M, suboccipital release technique, manual traction with progression to intermittent traction beginning at 13#, postural exercises, dry needling.   Madelin Weseman, [place], PT 05/09/2022, 2:19 PM

## 2022-05-18 NOTE — Progress Notes (Signed)
Remote pacemaker transmission.   

## 2022-05-20 ENCOUNTER — Other Ambulatory Visit: Payer: Self-pay | Admitting: Internal Medicine

## 2022-05-20 DIAGNOSIS — H6991 Unspecified Eustachian tube disorder, right ear: Secondary | ICD-10-CM

## 2022-05-20 DIAGNOSIS — J4540 Moderate persistent asthma, uncomplicated: Secondary | ICD-10-CM

## 2022-05-20 DIAGNOSIS — H6504 Acute serous otitis media, recurrent, right ear: Secondary | ICD-10-CM

## 2022-05-22 ENCOUNTER — Ambulatory Visit: Payer: Medicare Other | Admitting: Physical Therapy

## 2022-05-22 DIAGNOSIS — R293 Abnormal posture: Secondary | ICD-10-CM

## 2022-05-22 DIAGNOSIS — M542 Cervicalgia: Secondary | ICD-10-CM | POA: Diagnosis not present

## 2022-05-22 NOTE — Therapy (Signed)
[name] OUTPATIENT PHYSICAL THERAPY CERVICAL EVALUATION   Patient Name: [name] Glass MRN: [id number] DOB:1940-10-04, 81 y.o., male Today's Date: 05/09/2022   PT End of Session - 05/09/22 1411     Visit Number 5    Number of Visits 12    Date for PT Re-Evaluation 07/09/22    Authorization Type FOTO AT LEAST EVERY 5TH VISIT.  PROGRESS NOTE AT 10TH VISIT.  KX MODIFIER AFTER 15 VISITS.    PT Start Time 0135    PT Stop Time 0214    PT Time Calculation (min) 39 min    Activity Tolerance Patient tolerated treatment well    Behavior During Therapy [facility] for tasks assessed/performed             Past Medical History:  Diagnosis Date   Arthritis    Basal cell carcinoma 02/17/2020   infl & ulcerated-Left forehead (MOHS)   Coronary artery disease    GERD (gastroesophageal reflux disease)    H/O bronchitis    H/O hiatal hernia    History of kidney stones last in 1976   Hyperlipidemia    statin intolerant, under control   Hypertension    under control   Hypertensive retinopathy    OU   Hypothyroidism    Moderate persistent asthma without complication 08/09/1060   Retinal detachment    OD   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   left helix tx cx3 72f   Sleep apnea    hx of, surgery to reverse   Past Surgical History:  Procedure Laterality Date   CARDIAC CATHETERIZATION Right 2008   5 heart stents   CATARACT EXTRACTION Bilateral 6 years ago   CYSTOSCOPY N/A 08/06/2012   Procedure: CYSTOSCOPY;  Surgeon: JMalka So MD;  [facility];  Service: Urology;  Laterality: N/A;   ESOPHAGOGASTRIC FUNDOPLICATION  16948  spleen removed, 5 pints of blood given   EYE SURGERY     HERNIA REPAIR  1985   x4   INGUINAL HERNIA REPAIR  1992   PACEMAKER IMPLANT N/A 10/30/2018   Procedure: PACEMAKER IMPLANT;  Surgeon: CConstance Haw MD;  [place];  Service: Cardiovascular;  Laterality: N/A;   PACEMAKER INSERTION     PPM GENERATOR REMOVAL N/A 10/25/2018    Procedure: PPM GENERATOR REMOVAL;  Surgeon: CConstance Haw MD;  [place];  Service: Cardiovascular;  Laterality: N/A;   PROSTATE ABLATION  2013   "laser ablation"   RETINAL DETACHMENT SURGERY Right 01/24/2019   Pneumatic cryopexy - Dr. BBernarda Caffey  TEE WITHOUT CARDIOVERSION N/A 10/25/2018   Procedure: TRANSESOPHAGEAL ECHOCARDIOGRAM (TEE);  Surgeon: NAcie FredricksonPWonda Cheng MD;  [facility];  Service: Cardiovascular;  Laterality: N/A;   TEMPORARY PACEMAKER N/A 10/25/2018   Procedure: TEMPORARY PACEMAKER;  Surgeon: CConstance Haw MD;  [place] [facility];  Service: Cardiovascular;  Laterality: N/A;   TONSILLECTOMY  as child   TOTAL KNEE ARTHROPLASTY Right 08/03/2014   Procedure: TOTAL RIGHT KNEE ARTHROPLASTY;  Surgeon: FGearlean Alf MD;  [facility];  Service: Orthopedics;  Laterality: Right;   TRANSURETHRAL RESECTION OF PROSTATE N/A 08/06/2012   Procedure: FAlmyra FreeOF PROSTATE WITH GYRUS ;  Surgeon: JMalka So MD;  [facility];  Service: Urology;  Laterality: N/A;   UMBILICAL HERNIA REPAIR  1990   UVULECTOMY  2005   for sleep apnea   UVULOPALATOPHARYNGOPLASTY  6 years ago   Patient Active Problem List  Diagnosis Date Noted   Finger pain, right 04/17/2022   Cervico-occipital neuralgia of right side 04/17/2022   Chronic tension-type headache, not intractable 02/27/2022   Postural kyphosis of cervicothoracic region 02/27/2022   Moderate aortic stenosis 10/27/2020   Thoracic aortic aneurysm ([place]) 03/12/2020   Sinus node dysfunction (HCC) 02/17/2020   Moderate persistent asthma without complication 63/33/5456   Chronic bronchitis, mucopurulent ([place]) 06/09/2019   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   Cervicalgia 04/01/2019   Degenerative disc disease, cervical 04/01/2019   Hypothyroidism    Presence of permanent cardiac pacemaker 10/23/2018   Sleep apnea 10/23/2018   PSA elevation 06/26/2017   Benign prostatic hyperplasia without  lower urinary tract symptoms 05/21/2017   Seasonal allergic rhinitis due to pollen 05/18/2017   Chronic renal disease, stage 3, moderately decreased glomerular filtration rate (GFR) between 30-59 mL/min/1.73 square meter ([place]) 25/63/8937   Systolic murmur 34/28/7681   Gastroesophageal reflux disease without esophagitis 12/19/2015   Essential hypertension, benign 12/19/2015   Hyperlipidemia with target LDL less than 70 12/19/2015   OA (osteoarthritis) of knee 08/03/2014   Coronary artery disease 07/10/2014     REFERRING PROVIDER: Durel Salts  REFERRING DIAG: Myofascial neck pain  THERAPY DIAG:  Cervicalgia  Abnormal posture  Rationale for Evaluation and Treatment: Rehabilitation  ONSET DATE: ~2 years.  SUBJECTIVE:                                                                                                                                                                                                         SUBJECTIVE STATEMENT: Pretty bad headache yesterday but doing well today. PERTINENT HISTORY:  PACEMAKER, HTN, COPD, right TKA, basal cell carcinoma (recent removal from right upper back region).  PAIN:  Are you having pain? Yes: NPRS scale: 2/10 Pain location: Right cervical Pain description: Ache, sharp. Aggravating factors: As above. Relieving factors: As above.  PRECAUTIONS: ICD/Pacemaker  PLOF: Independent  PATIENT GOALS: Reduce pain and headaches which will improve quality of life.  NEXT MD VISIT:   OBJECTIVE:  Trigger Point Dry-Needling  Treatment instructions: Expect mild to moderate muscle soreness. S/S of pneumothorax if dry needled over a lung field, and to seek immediate medical attention should they occur. Patient verbalized understanding of these instructions and education.  Patient Consent Given: Yes Education handout provided: Yes Muscles treated: Splenius capitus/cervicis.  Treatment response/outcome: Twitch  f/b STW/M x 23 minutes to  patient's affected cervical paraspinal musculature, suboccipital and right upper thoracic region. Patient tolerated treatment without complaint with normal modality response following removal of modality.   ASSESSMENT:  CLINICAL  IMPRESSION: Headache yesterday but not bad today.  Patient overall reporting very good improvement with headaches less frequent and intense. GOALS:  SHORT TERM GOALS: Target date: 05/23/2022   Ind with a HEP. Goal status: INITIAL   LONG TERM GOALS: Target date: 06/20/2022  Bilateral active cervical rotation to 60 degrees so he can turn head more easily while driving. Goal status: INITIAL  2.  Eliminate headaches. Goal status: INITIAL  3.  Sit/stand 20 minutes with pain not > 2-3/10. Goal status: INITIAL   PLAN:  PT FREQUENCY: 2x/week  PT DURATION: 6 weeks  PLANNED INTERVENTIONS: Therapeutic exercises, Therapeutic activity, Patient/Family education, Self Care, Dry Needling, Cryotherapy, Moist heat, Traction, and Manual therapy  PLAN FOR NEXT SESSION: STW/M, suboccipital release technique, manual traction with progression to intermittent traction beginning at 13#, postural exercises, dry needling.   Ezri Fanguy, [place], PT 05/09/2022, 2:19 PM

## 2022-05-23 ENCOUNTER — Encounter: Payer: Self-pay | Admitting: Gastroenterology

## 2022-06-03 ENCOUNTER — Other Ambulatory Visit: Payer: Self-pay | Admitting: Internal Medicine

## 2022-06-07 ENCOUNTER — Ambulatory Visit: Payer: Medicare Other | Attending: Physical Medicine and Rehabilitation | Admitting: Physical Therapy

## 2022-06-07 DIAGNOSIS — M542 Cervicalgia: Secondary | ICD-10-CM | POA: Diagnosis not present

## 2022-06-07 DIAGNOSIS — R293 Abnormal posture: Secondary | ICD-10-CM | POA: Diagnosis not present

## 2022-06-07 NOTE — Therapy (Signed)
[name] OUTPATIENT PHYSICAL THERAPY CERVICAL EVALUATION   Patient Name: [name] Glass MRN: [id number] DOB:[birth year], 82 y.o., male Today's Date: 06/07/2022   PT End of Session - 06/07/22 1326     Visit Number 8    Number of Visits 12    Date for PT Re-Evaluation 07/09/22    Authorization Type FOTO AT LEAST EVERY 5TH VISIT.  PROGRESS NOTE AT 10TH VISIT.  KX MODIFIER AFTER 15 VISITS.    PT Start Time 1115    PT Stop Time 1145    PT Time Calculation (min) 30 min    Activity Tolerance Patient tolerated treatment well    Behavior During Therapy [facility] for tasks assessed/performed             Past Medical History:  Diagnosis Date   Arthritis    Basal cell carcinoma 02/17/2020   infl & ulcerated-Left forehead (MOHS)   Coronary artery disease    GERD (gastroesophageal reflux disease)    H/O bronchitis    H/O hiatal hernia    History of kidney stones last in 1976   Hyperlipidemia    statin intolerant, under control   Hypertension    under control   Hypertensive retinopathy    OU   Hypothyroidism    Moderate persistent asthma without complication 12/04/5364   Retinal detachment    OD   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   left helix tx cx3 106f   Sleep apnea    hx of, surgery to reverse   Past Surgical History:  Procedure Laterality Date   CARDIAC CATHETERIZATION Right 2008   5 heart stents   CATARACT EXTRACTION Bilateral 6 years ago   CYSTOSCOPY N/A 08/06/2012   Procedure: CYSTOSCOPY;  Surgeon: JMalka So MD;  [facility];  Service: Urology;  Laterality: N/A;   ESOPHAGOGASTRIC FUNDOPLICATION  14403  spleen removed, 5 pints of blood given   EYE SURGERY     HERNIA REPAIR  1985   x4   INGUINAL HERNIA REPAIR  1992   PACEMAKER IMPLANT N/A 10/30/2018   Procedure: PACEMAKER IMPLANT;  Surgeon: CConstance Haw MD;  [place] [facility];  Service: Cardiovascular;  Laterality: N/A;   PACEMAKER INSERTION     PPM GENERATOR REMOVAL N/A 10/25/2018    Procedure: PPM GENERATOR REMOVAL;  Surgeon: CConstance Haw MD;  [place] [facility];  Service: Cardiovascular;  Laterality: N/A;   PROSTATE ABLATION  2013   "laser ablation"   RETINAL DETACHMENT SURGERY Right 01/24/2019   Pneumatic cryopexy - Dr. BBernarda Caffey  TEE WITHOUT CARDIOVERSION N/A 10/25/2018   Procedure: TRANSESOPHAGEAL ECHOCARDIOGRAM (TEE);  Surgeon: NAcie FredricksonPWonda Cheng MD;  [facility];  Service: Cardiovascular;  Laterality: N/A;   TEMPORARY PACEMAKER N/A 10/25/2018   Procedure: TEMPORARY PACEMAKER;  Surgeon: CConstance Haw MD;  [place];  Service: Cardiovascular;  Laterality: N/A;   TONSILLECTOMY  as child   TOTAL KNEE ARTHROPLASTY Right 08/03/2014   Procedure: TOTAL RIGHT KNEE ARTHROPLASTY;  Surgeon: FGearlean Alf MD;  [facility];  Service: Orthopedics;  Laterality: Right;   TRANSURETHRAL RESECTION OF PROSTATE N/A 08/06/2012   Procedure: FAlmyra FreeOF PROSTATE WITH GYRUS ;  Surgeon: JMalka So MD;  [facility];  Service: Urology;  Laterality: N/A;   UMBILICAL HERNIA REPAIR  1990   UVULECTOMY  2005   for sleep apnea   UVULOPALATOPHARYNGOPLASTY  6 years ago   Patient Active Problem List  Diagnosis Date Noted   Finger pain, right 04/17/2022   Cervico-occipital neuralgia of right side 04/17/2022   Chronic tension-type headache, not intractable 02/27/2022   Postural kyphosis of cervicothoracic region 02/27/2022   Moderate aortic stenosis 10/27/2020   Thoracic aortic aneurysm ([place]) 03/12/2020   Sinus node dysfunction (HCC) 02/17/2020   Moderate persistent asthma without complication 73/71/0626   Chronic bronchitis, mucopurulent ([place]) 06/09/2019   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   Cervicalgia 04/01/2019   Degenerative disc disease, cervical 04/01/2019   Hypothyroidism    Presence of permanent cardiac pacemaker 10/23/2018   Sleep apnea 10/23/2018   PSA elevation 06/26/2017   Benign prostatic hyperplasia without  lower urinary tract symptoms 05/21/2017   Seasonal allergic rhinitis due to pollen 05/18/2017   Chronic renal disease, stage 3, moderately decreased glomerular filtration rate (GFR) between 30-59 mL/min/1.73 square meter ([place]) 94/85/4627   Systolic murmur 03/50/0938   Gastroesophageal reflux disease without esophagitis 12/19/2015   Essential hypertension, benign 12/19/2015   Hyperlipidemia with target LDL less than 70 12/19/2015   OA (osteoarthritis) of knee 08/03/2014   Coronary artery disease 07/10/2014     REFERRING PROVIDER: Durel Salts  REFERRING DIAG: Myofascial neck pain  THERAPY DIAG:  Cervicalgia  Abnormal posture  Rationale for Evaluation and Treatment: Rehabilitation  ONSET DATE: ~2 years.  SUBJECTIVE:                                                                                                                                                                                                         SUBJECTIVE STATEMENT: Low pain with no significant headache today. PERTINENT HISTORY:  PACEMAKER, HTN, COPD, right TKA, basal cell carcinoma (recent removal from right upper back region).  PAIN:  Are you having pain? Yes: NPRS scale: 2/10 Pain location: Right cervical Pain description: Ache, sharp. Aggravating factors: As above. Relieving factors: As above.  PRECAUTIONS: ICD/Pacemaker  PLOF: Independent  PATIENT GOALS: Reduce pain and headaches which will improve quality of life.  NEXT MD VISIT:   OBJECTIVE:  Trigger Point Dry-Needling  Treatment instructions: Expect mild to moderate muscle soreness. S/S of pneumothorax if dry needled over a lung field, and to seek immediate medical attention should they occur. Patient verbalized understanding of these instructions and education.  Patient Consent Given: Yes Education handout provided: Yes Muscles treated: Splenius capitus/cervicis.  Treatment response/outcome: Twitch  f/b STW/M x 26 minutes to patient's  affected cervical paraspinal musculature, suboccipital and right upper thoracic region. Patient tolerated treatment without complaint with normal modality response following removal of modality.   ASSESSMENT:  CLINICAL IMPRESSION:  Pain has been consistently low over the last couple of weeks.  Good response to treatments thus far.  Progressing toward goals. GOALS:  SHORT TERM GOALS: Target date: 06/21/2022   Ind with a HEP. Goal status: INITIAL   LONG TERM GOALS: Target date: 07/19/2022  Bilateral active cervical rotation to 60 degrees so he can turn head more easily while driving. Goal status:  Ongoing.  2.  Eliminate headaches. Goal status: Ongoing.  3.  Sit/stand 20 minutes with pain not > 2-3/10. Goal status: Ongoing.   PLAN:  PT FREQUENCY: 2x/week  PT DURATION: 6 weeks  PLANNED INTERVENTIONS: Therapeutic exercises, Therapeutic activity, Patient/Family education, Self Care, Dry Needling, Cryotherapy, Moist heat, Traction, and Manual therapy  PLAN FOR NEXT SESSION: STW/M, suboccipital release technique, manual traction with progression to intermittent traction beginning at 13#, postural exercises, dry needling.   Brazos Sandoval, [place], PT 06/07/2022, 1:28 PM

## 2022-06-12 ENCOUNTER — Encounter: Payer: Self-pay | Admitting: Physical Therapy

## 2022-06-12 ENCOUNTER — Ambulatory Visit: Payer: Medicare Other | Admitting: Physical Therapy

## 2022-06-12 DIAGNOSIS — M542 Cervicalgia: Secondary | ICD-10-CM | POA: Diagnosis not present

## 2022-06-12 DIAGNOSIS — R293 Abnormal posture: Secondary | ICD-10-CM

## 2022-06-12 NOTE — Therapy (Signed)
[name] OUTPATIENT PHYSICAL THERAPY CERVICAL EVALUATION   Patient Name: KENZIE FLAKES MRN: [id number] DOB:11/23/1940, 82 y.o., male Today's Date: 06/12/2022   PT End of Session - 06/12/22 1221     Visit Number 9    Number of Visits 12    Date for PT Re-Evaluation 07/09/22    Authorization Type FOTO AT LEAST EVERY 5TH VISIT.  PROGRESS NOTE AT 10TH VISIT.  KX MODIFIER AFTER 15 VISITS.    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   left helix tx cx3 68f   Sleep apnea    hx of, surgery to reverse   Past Surgical History:  Procedure Laterality Date   CARDIAC CATHETERIZATION Right 2008   5 heart stents   CATARACT EXTRACTION Bilateral 6 years ago   CYSTOSCOPY N/A 08/06/2012   Procedure: CYSTOSCOPY;  Surgeon: JMalka So MD;  [facility];  Service: Urology;  Laterality: N/A;   ESOPHAGOGASTRIC FUNDOPLICATION  12671  spleen removed, 5 pints of blood given   EYE SURGERY     HERNIA REPAIR  1985   x4   INGUINAL HERNIA REPAIR  1992   PACEMAKER IMPLANT N/A 10/30/2018   Procedure: PACEMAKER IMPLANT;  Surgeon: CConstance Haw MD;  [place] [facility];  Service: Cardiovascular;  Laterality: N/A;   PACEMAKER INSERTION     PPM GENERATOR REMOVAL N/A 10/25/2018   Procedure: PPM GENERATOR REMOVAL;  Surgeon: CConstance Haw MD;  [place] [facility];  Service:  Cardiovascular;  Laterality: N/A;   PROSTATE ABLATION  2013   "laser ablation"   RETINAL DETACHMENT SURGERY Right 01/24/2019   Pneumatic cryopexy - Dr. BBernarda Caffey  TEE WITHOUT CARDIOVERSION N/A 10/25/2018   Procedure: TRANSESOPHAGEAL ECHOCARDIOGRAM (TEE);  Surgeon: NAcie FredricksonPWonda Cheng MD;  [facility];  Service: Cardiovascular;  Laterality: N/A;   TEMPORARY PACEMAKER N/A 10/25/2018   Procedure: TEMPORARY PACEMAKER;  Surgeon: CConstance Haw MD;  [place] [facility];  Service: Cardiovascular;  Laterality: N/A;   TONSILLECTOMY  as child   TOTAL KNEE ARTHROPLASTY Right 08/03/2014   Procedure: TOTAL RIGHT KNEE ARTHROPLASTY;  Surgeon: FGearlean Alf MD;  [facility];  Service: Orthopedics;  Laterality: Right;   TRANSURETHRAL RESECTION OF PROSTATE N/A 08/06/2012   Procedure: FAlmyra FreeOF PROSTATE WITH GYRUS ;  Surgeon: JMalka So MD;  [facility];  Service: Urology;  Laterality: N/A;   UMBILICAL HERNIA REPAIR  1990   UVULECTOMY  2005   for sleep apnea   UVULOPALATOPHARYNGOPLASTY  6 years ago   Patient Active Problem List   Diagnosis Date Noted   Finger pain, right 04/17/2022   Cervico-occipital neuralgia of right side 04/17/2022  Chronic tension-type headache, not intractable 02/27/2022   Postural kyphosis of cervicothoracic region 02/27/2022   Moderate aortic stenosis 10/27/2020   Thoracic aortic aneurysm ([place]) 03/12/2020   Sinus node dysfunction ([place]) 02/17/2020   Moderate persistent asthma without complication 24/40/1027   Chronic bronchitis, mucopurulent ([place]) 06/09/2019   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   Cervicalgia 04/01/2019   Degenerative disc disease, cervical 04/01/2019   Hypothyroidism    Presence of permanent cardiac pacemaker 10/23/2018   Sleep apnea 10/23/2018   PSA elevation 06/26/2017   Benign prostatic hyperplasia without lower urinary tract symptoms 05/21/2017   Seasonal allergic rhinitis due to pollen 05/18/2017   Chronic renal  disease, stage 3, moderately decreased glomerular filtration rate (GFR) between 30-59 mL/min/1.73 square meter ([place]) 25/36/6440   Systolic murmur 34/74/2595   Gastroesophageal reflux disease without esophagitis 12/19/2015   Essential hypertension, benign 12/19/2015   Hyperlipidemia with target LDL less than 70 12/19/2015   OA (osteoarthritis) of knee 08/03/2014   Coronary artery disease 07/10/2014     REFERRING PROVIDER: Durel Salts  REFERRING DIAG: Myofascial neck pain  THERAPY DIAG:  No diagnosis found.  Rationale for Evaluation and Treatment: Rehabilitation  ONSET DATE: ~2 years.  SUBJECTIVE:                                                                                                                                                                                                         SUBJECTIVE STATEMENT: Low pain with no significant headache today. PERTINENT HISTORY:  PACEMAKER, HTN, COPD, right TKA, basal cell carcinoma (recent removal from right upper back region).  PAIN:  Are you having pain? Yes: NPRS scale: 2/10 Pain location: Right cervical Pain description: Ache, sharp. Aggravating factors: As above. Relieving factors: As above.  PRECAUTIONS: ICD/Pacemaker  PLOF: Independent  PATIENT GOALS: Reduce pain and headaches which will improve quality of life.  NEXT MD VISIT:   OBJECTIVE:  Trigger Point Dry-Needling  Treatment instructions: Expect mild to moderate muscle soreness. S/S of pneumothorax if dry needled over a lung field, and to seek immediate medical attention should they occur. Patient verbalized understanding of these instructions and education.  Patient Consent Given: Yes Education handout provided: Yes Muscles treated: Splenius capitus/cervicis.  Treatment response/outcome: Twitch  f/b STW/M x 26 minutes to patient's affected cervical paraspinal musculature, suboccipital and right upper thoracic region. Patient tolerated treatment without  complaint with normal modality response following removal of modality.   ASSESSMENT:  CLINICAL IMPRESSION: Pain has been consistently low over the last couple of weeks. Headaches much less intense. GOALS:  SHORT TERM GOALS:  Target date: 06/26/2022   Ind with a HEP. Goal status: INITIAL   LONG TERM GOALS: Target date: 07/24/2022  Bilateral active cervical rotation to 60 degrees so he can turn head more easily while driving. Goal status:  Ongoing.  2.  Eliminate headaches. Goal status: Ongoing.  3.  Sit/stand 20 minutes with pain not > 2-3/10. Goal status: Ongoing.   PLAN:  PT FREQUENCY: 2x/week  PT DURATION: 6 weeks  PLANNED INTERVENTIONS: Therapeutic exercises, Therapeutic activity, Patient/Family education, Self Care, Dry Needling, Cryotherapy, Moist heat, Traction, and Manual therapy  PLAN FOR NEXT SESSION: STW/M, suboccipital release technique, manual traction with progression to intermittent traction beginning at 13#, postural exercises, dry needling.   Aqeel Norgaard, [place], PT 06/12/2022, 1:28 PM

## 2022-06-14 DIAGNOSIS — L905 Scar conditions and fibrosis of skin: Secondary | ICD-10-CM | POA: Diagnosis not present

## 2022-06-14 DIAGNOSIS — D485 Neoplasm of uncertain behavior of skin: Secondary | ICD-10-CM | POA: Diagnosis not present

## 2022-06-14 DIAGNOSIS — D045 Carcinoma in situ of skin of trunk: Secondary | ICD-10-CM | POA: Diagnosis not present

## 2022-06-14 DIAGNOSIS — Z85820 Personal history of malignant melanoma of skin: Secondary | ICD-10-CM | POA: Diagnosis not present

## 2022-06-14 DIAGNOSIS — Z85828 Personal history of other malignant neoplasm of skin: Secondary | ICD-10-CM | POA: Diagnosis not present

## 2022-06-14 DIAGNOSIS — L570 Actinic keratosis: Secondary | ICD-10-CM | POA: Diagnosis not present

## 2022-06-20 ENCOUNTER — Ambulatory Visit: Payer: Medicare Other | Admitting: Physical Therapy

## 2022-06-20 DIAGNOSIS — R293 Abnormal posture: Secondary | ICD-10-CM

## 2022-06-20 DIAGNOSIS — M542 Cervicalgia: Secondary | ICD-10-CM | POA: Diagnosis not present

## 2022-06-20 NOTE — Therapy (Signed)
[name] OUTPATIENT PHYSICAL THERAPY CERVICAL EVALUATION   Patient Name: [name] Glass MRN: [id number] DOB:[birth year], 82 y.o., male Today's Date: 06/20/2022   PT End of Session - 06/20/22 1211     Visit Number 10    Number of Visits 12    Date for PT Re-Evaluation 07/09/22    Authorization Type FOTO AT LEAST EVERY 5TH VISIT.  PROGRESS NOTE AT 10TH VISIT.  KX MODIFIER AFTER 15 VISITS.    PT Start Time 1117    PT Stop Time 1153    PT Time Calculation (min) 36 min    Activity Tolerance Patient tolerated treatment well    Behavior During Therapy [facility] for tasks assessed/performed             Past Medical History:  Diagnosis Date   Arthritis    Basal cell carcinoma 02/17/2020   infl & ulcerated-Left forehead (MOHS)   Coronary artery disease    GERD (gastroesophageal reflux disease)    H/O bronchitis    H/O hiatal hernia    History of kidney stones last in 1976   Hyperlipidemia    statin intolerant, under control   Hypertension    under control   Hypertensive retinopathy    OU   Hypothyroidism    Moderate persistent asthma without complication 02/05/7901   Retinal detachment    OD   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   left helix tx cx3 23f   Sleep apnea    hx of, surgery to reverse   Past Surgical History:  Procedure Laterality Date   CARDIAC CATHETERIZATION Right 2008   5 heart stents   CATARACT EXTRACTION Bilateral 6 years ago   CYSTOSCOPY N/A 08/06/2012   Procedure: CYSTOSCOPY;  Surgeon: JMalka So MD;  [facility];  Service: Urology;  Laterality: N/A;   ESOPHAGOGASTRIC FUNDOPLICATION  14097  spleen removed, 5 pints of blood given   EYE SURGERY     HERNIA REPAIR  1985   x4   INGUINAL HERNIA REPAIR  1992   PACEMAKER IMPLANT N/A 10/30/2018   Procedure: PACEMAKER IMPLANT;  Surgeon: CConstance Haw MD;  [place];  Service: Cardiovascular;  Laterality: N/A;   PACEMAKER INSERTION     PPM GENERATOR REMOVAL N/A 10/25/2018    Procedure: PPM GENERATOR REMOVAL;  Surgeon: CConstance Haw MD;  [facility];  Service: Cardiovascular;  Laterality: N/A;   PROSTATE ABLATION  2013   "laser ablation"   RETINAL DETACHMENT SURGERY Right 01/24/2019   Pneumatic cryopexy - Dr. BBernarda Caffey  TEE WITHOUT CARDIOVERSION N/A 10/25/2018   Procedure: TRANSESOPHAGEAL ECHOCARDIOGRAM (TEE);  Surgeon: NAcie FredricksonPWonda Cheng MD;  [facility];  Service: Cardiovascular;  Laterality: N/A;   TEMPORARY PACEMAKER N/A 10/25/2018   Procedure: TEMPORARY PACEMAKER;  Surgeon: CConstance Haw MD;  [facility];  Service: Cardiovascular;  Laterality: N/A;   TONSILLECTOMY  as child   TOTAL KNEE ARTHROPLASTY Right 08/03/2014   Procedure: TOTAL RIGHT KNEE ARTHROPLASTY;  Surgeon: FGearlean Alf MD;  [facility];  Service: Orthopedics;  Laterality: Right;   TRANSURETHRAL RESECTION OF PROSTATE N/A 08/06/2012   Procedure: FAlmyra FreeOF PROSTATE WITH GYRUS ;  Surgeon: JMalka So MD;  [facility];  Service: Urology;  Laterality: N/A;   UMBILICAL HERNIA REPAIR  1990   UVULECTOMY  2005   for sleep apnea   UVULOPALATOPHARYNGOPLASTY  6 years ago   Patient Active Problem List  Diagnosis Date Noted   Finger pain, right 04/17/2022   Cervico-occipital neuralgia of right side 04/17/2022   Chronic tension-type headache, not intractable 02/27/2022   Postural kyphosis of cervicothoracic region 02/27/2022   Moderate aortic stenosis 10/27/2020   Thoracic aortic aneurysm ([place]) 03/12/2020   Sinus node dysfunction ([facility]) 02/17/2020   Moderate persistent asthma without complication 16/03/9603   Chronic bronchitis, mucopurulent ([place]) 06/09/2019   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   Cervicalgia 04/01/2019   Degenerative disc disease, cervical 04/01/2019   Hypothyroidism    Presence of permanent cardiac pacemaker 10/23/2018   Sleep apnea 10/23/2018   PSA elevation 06/26/2017   Benign prostatic hyperplasia without  lower urinary tract symptoms 05/21/2017   Seasonal allergic rhinitis due to pollen 05/18/2017   Chronic renal disease, stage 3, moderately decreased glomerular filtration rate (GFR) between 30-59 mL/min/1.73 square meter ([place]) 54/02/8118   Systolic murmur 14/78/2956   Gastroesophageal reflux disease without esophagitis 12/19/2015   Essential hypertension, benign 12/19/2015   Hyperlipidemia with target LDL less than 70 12/19/2015   OA (osteoarthritis) of knee 08/03/2014   Coronary artery disease 07/10/2014     REFERRING PROVIDER: Durel Salts  REFERRING DIAG: Myofascial neck pain  THERAPY DIAG:  Cervicalgia  Abnormal posture  Rationale for Evaluation and Treatment: Rehabilitation  ONSET DATE: ~2 years.  SUBJECTIVE:                                                                                                                                                                                                         SUBJECTIVE STATEMENT: Did a lot of driving.  Neck feels tight but pain remains low. PERTINENT HISTORY:  PACEMAKER, HTN, COPD, right TKA, basal cell carcinoma (recent removal from right upper back region).  PAIN:  Are you having pain? Yes: NPRS scale: 2/10 Pain location: Right cervical Pain description: Ache, sharp. Aggravating factors: As above. Relieving factors: As above.  PRECAUTIONS: ICD/Pacemaker  PLOF: Independent  PATIENT GOALS: Reduce pain and headaches which will improve quality of life.  NEXT MD VISIT:   OBJECTIVE:  Trigger Point Dry-Needling  Treatment instructions: Expect mild to moderate muscle soreness. S/S of pneumothorax if dry needled over a lung field, and to seek immediate medical attention should they occur. Patient verbalized understanding of these instructions and education.  Patient Consent Given: Yes Education handout provided: Yes Muscles treated: Splenius capitus/cervicis/multifidi.  Treatment response/outcome: Twitch  f/b STW/M  x 26 minutes to patient's affected cervical paraspinal musculature, suboccipital and right upper thoracic region. Reviewed chin tucks and towel assisted extensions.  Patient tolerated treatment without complaint  with normal modality response following removal of modality.   ASSESSMENT:  CLINICAL IMPRESSION: Right cervical paraspinal musculature was quite taut today but pain has remained low with no significant headaches as of late. GOALS:  SHORT TERM GOALS: Target date: 07/04/2022   Ind with a HEP. Goal status: INITIAL   LONG TERM GOALS: Target date: 08/01/2022  Bilateral active cervical rotation to 60 degrees so he can turn head more easily while driving. Goal status:  Ongoing.  2.  Eliminate headaches. Goal status: Partially met.  3.  Sit/stand 20 minutes with pain not > 2-3/10. Goal status: Ongoing.   PLAN:  PT FREQUENCY: 2x/week  PT DURATION: 6 weeks  PLANNED INTERVENTIONS: Therapeutic exercises, Therapeutic activity, Patient/Family education, Self Care, Dry Needling, Cryotherapy, Moist heat, Traction, and Manual therapy  PLAN FOR NEXT SESSION: STW/M, suboccipital release technique, manual traction with progression to intermittent traction beginning at 13#, postural exercises, dry needling.  Progress Note Reporting Period 04/10/22 to 06/20/22  See note below for Objective Data and Assessment of Progress/Goals. Very good progress toward goals.  Pain has been consistently low and headache intensity reduced significantly.    Taneshia Lorence, [place], PT 06/20/2022, 12:12 PM

## 2022-06-22 ENCOUNTER — Ambulatory Visit: Payer: Medicare Other | Admitting: Gastroenterology

## 2022-06-24 ENCOUNTER — Other Ambulatory Visit: Payer: Self-pay | Admitting: Internal Medicine

## 2022-06-26 ENCOUNTER — Ambulatory Visit: Payer: Medicare Other | Admitting: Physical Therapy

## 2022-06-26 DIAGNOSIS — M542 Cervicalgia: Secondary | ICD-10-CM

## 2022-06-26 DIAGNOSIS — R293 Abnormal posture: Secondary | ICD-10-CM | POA: Diagnosis not present

## 2022-06-26 NOTE — Therapy (Signed)
[name] OUTPATIENT PHYSICAL THERAPY CERVICAL TREATMENT   Patient Name: [name] Glass MRN: [id number] DOB:Oct 15, 1940, 82 y.o., male Today's Date: 06/26/2022   PT End of Session - 06/26/22 1149     Visit Number 11    Number of Visits 12    Date for PT Re-Evaluation 07/09/22    PT Start Time 1117    Activity Tolerance Patient tolerated treatment well    Behavior During Therapy [facility] for tasks assessed/performed             Past Medical History:  Diagnosis Date   Arthritis    Basal cell carcinoma 02/17/2020   infl & ulcerated-Left forehead (MOHS)   Coronary artery disease    GERD (gastroesophageal reflux disease)    H/O bronchitis    H/O hiatal hernia    History of kidney stones last in 1976   Hyperlipidemia    statin intolerant, under control   Hypertension    under control   Hypertensive retinopathy    OU   Hypothyroidism    Moderate persistent asthma without complication 07/10/971   Retinal detachment    OD   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   left helix tx cx3 90f   Sleep apnea    hx of, surgery to reverse   Past Surgical History:  Procedure Laterality Date   CARDIAC CATHETERIZATION Right 2008   5 heart stents   CATARACT EXTRACTION Bilateral 6 years ago   CYSTOSCOPY N/A 08/06/2012   Procedure: CYSTOSCOPY;  Surgeon: JMalka So MD;  [facility];  Service: Urology;  Laterality: N/A;   ESOPHAGOGASTRIC FUNDOPLICATION  15329  spleen removed, 5 pints of blood given   EYE SURGERY     HERNIA REPAIR  1985   x4   INGUINAL HERNIA REPAIR  1992   PACEMAKER IMPLANT N/A 10/30/2018   Procedure: PACEMAKER IMPLANT;  Surgeon: CConstance Haw MD;  [place];  Service: Cardiovascular;  Laterality: N/A;   PACEMAKER INSERTION     PPM GENERATOR REMOVAL N/A 10/25/2018   Procedure: PPM GENERATOR REMOVAL;  Surgeon: CConstance Haw MD;  [place] [facility];  Service: Cardiovascular;  Laterality: N/A;   PROSTATE ABLATION  2013    "laser ablation"   RETINAL DETACHMENT SURGERY Right 01/24/2019   Pneumatic cryopexy - Dr. BBernarda Caffey  TEE WITHOUT CARDIOVERSION N/A 10/25/2018   Procedure: TRANSESOPHAGEAL ECHOCARDIOGRAM (TEE);  Surgeon: NAcie FredricksonPWonda Cheng MD;  [facility];  Service: Cardiovascular;  Laterality: N/A;   TEMPORARY PACEMAKER N/A 10/25/2018   Procedure: TEMPORARY PACEMAKER;  Surgeon: CConstance Haw MD;  [place];  Service: Cardiovascular;  Laterality: N/A;   TONSILLECTOMY  as child   TOTAL KNEE ARTHROPLASTY Right 08/03/2014   Procedure: TOTAL RIGHT KNEE ARTHROPLASTY;  Surgeon: FGearlean Alf MD;  [facility];  Service: Orthopedics;  Laterality: Right;   TRANSURETHRAL RESECTION OF PROSTATE N/A 08/06/2012   Procedure: FAlmyra FreeOF PROSTATE WITH GYRUS ;  Surgeon: JMalka So MD;  [facility];  Service: Urology;  Laterality: N/A;   UMBILICAL HERNIA REPAIR  1990   UVULECTOMY  2005   for sleep apnea   UVULOPALATOPHARYNGOPLASTY  6 years ago   Patient Active Problem List   Diagnosis Date Noted   Finger pain, right 04/17/2022   Cervico-occipital neuralgia of right side 04/17/2022   Chronic tension-type headache, not intractable 02/27/2022   Postural kyphosis of cervicothoracic region 02/27/2022   Moderate aortic stenosis  10/27/2020   Thoracic aortic aneurysm ([place]) 03/12/2020   Sinus node dysfunction ([place]) 02/17/2020   Moderate persistent asthma without complication 68/34/1962   Chronic bronchitis, mucopurulent ([place]) 06/09/2019   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   Cervicalgia 04/01/2019   Degenerative disc disease, cervical 04/01/2019   Hypothyroidism    Presence of permanent cardiac pacemaker 10/23/2018   Sleep apnea 10/23/2018   PSA elevation 06/26/2017   Benign prostatic hyperplasia without lower urinary tract symptoms 05/21/2017   Seasonal allergic rhinitis due to pollen 05/18/2017   Chronic renal disease, stage 3, moderately decreased glomerular filtration  rate (GFR) between 30-59 mL/min/1.73 square meter ([place]) 22/97/9892   Systolic murmur 11/94/1740   Gastroesophageal reflux disease without esophagitis 12/19/2015   Essential hypertension, benign 12/19/2015   Hyperlipidemia with target LDL less than 70 12/19/2015   OA (osteoarthritis) of knee 08/03/2014   Coronary artery disease 07/10/2014     REFERRING PROVIDER: Durel Salts  REFERRING DIAG: Myofascial neck pain  THERAPY DIAG:  Cervicalgia  Abnormal posture  Rationale for Evaluation and Treatment: Rehabilitation  ONSET DATE: ~2 years.  SUBJECTIVE:                                                                                                                                                                                                         SUBJECTIVE STATEMENT: Pain at a 4 today.   PERTINENT HISTORY:  PACEMAKER, HTN, COPD, right TKA, basal cell carcinoma (recent removal from right upper back region).  PAIN:  Are you having pain? Yes: NPRS scale: 4/10 Pain location: Right cervical Pain description: Ache, sharp. Aggravating factors: As above. Relieving factors: As above.  PRECAUTIONS: ICD/Pacemaker  PLOF: Independent  PATIENT GOALS: Reduce pain and headaches which will improve quality of life.  NEXT MD VISIT:   OBJECTIVE:  Trigger Point Dry-Needling  Treatment instructions: Expect mild to moderate muscle soreness. S/S of pneumothorax if dry needled over a lung field, and to seek immediate medical attention should they occur. Patient verbalized understanding of these instructions and education.  Patient Consent Given: Yes Education handout provided: Yes Muscles treated: Splenius capitus/cervicis.  Right side.  Treatment response/outcome: Twitch  f/b STW/M x 23 minutes to patient's affected cervical paraspinal musculature, suboccipital and right upper thoracic region. HMP x 10 minutes to patient's right cervical region x 10 minutes.  Patient tolerated treatment  without complaint with normal modality response following removal of modality.   ASSESSMENT:  CLINICAL IMPRESSION: Patient with increased tone and pain over right cervical paraspinal musculature today.  Treatment focused on right side today as he had  a dermatological procedure to his left scapular region last week.   GOALS:  SHORT TERM GOALS: Target date: 07/10/2022   Ind with a HEP. Goal status: INITIAL   LONG TERM GOALS: Target date: 08/07/2022  Bilateral active cervical rotation to 60 degrees so he can turn head more easily while driving. Goal status:  Ongoing.  2.  Eliminate headaches. Goal status: Ongoing.  3.  Sit/stand 20 minutes with pain not > 2-3/10. Goal status: Ongoing.   PLAN:  PT FREQUENCY: 2x/week  PT DURATION: 6 weeks  PLANNED INTERVENTIONS: Therapeutic exercises, Therapeutic activity, Patient/Family education, Self Care, Dry Needling, Cryotherapy, Moist heat, Traction, and Manual therapy  PLAN FOR NEXT SESSION: STW/M, suboccipital release technique, manual traction with progression to intermittent traction beginning at 13#, postural exercises, dry needling.   Candon Caras, [place], PT 06/26/2022, 12:03 PM

## 2022-07-03 ENCOUNTER — Ambulatory Visit: Payer: Medicare Other | Admitting: Physical Therapy

## 2022-07-03 DIAGNOSIS — M542 Cervicalgia: Secondary | ICD-10-CM | POA: Diagnosis not present

## 2022-07-03 DIAGNOSIS — R293 Abnormal posture: Secondary | ICD-10-CM

## 2022-07-03 NOTE — Therapy (Signed)
[name] OUTPATIENT PHYSICAL THERAPY CERVICAL TREATMENT   Patient Name: [name] Glass MRN: [id number] DOB:Jun 01, 1941, 82 y.o., male Today's Date: 07/03/2022   PT End of Session - 07/03/22 1155     Visit Number 12    Number of Visits 18    Date for PT Re-Evaluation 07/31/22    PT Start Time 46    PT Stop Time [id number]    PT Time Calculation (min) 30 min    Activity Tolerance Patient tolerated treatment well    Behavior During Therapy [facility] for tasks assessed/performed             Past Medical History:  Diagnosis Date   Arthritis    Basal cell carcinoma 02/17/2020   infl & ulcerated-Left forehead (MOHS)   Coronary artery disease    GERD (gastroesophageal reflux disease)    H/O bronchitis    H/O hiatal hernia    History of kidney stones last in 1976   Hyperlipidemia    statin intolerant, under control   Hypertension    under control   Hypertensive retinopathy    OU   Hypothyroidism    Moderate persistent asthma without complication [id number]   Retinal detachment    OD   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   left helix tx cx3 [id number]   Sleep apnea    hx of, surgery to reverse   Past Surgical History:  Procedure Laterality Date   CARDIAC CATHETERIZATION Right 2008   5 heart stents   CATARACT EXTRACTION Bilateral 6 years ago   CYSTOSCOPY N/A 08/06/2012   Procedure: CYSTOSCOPY;  Surgeon: JMalka So MD;  Location: [facility];  Service: Urology;  Laterality: N/A;   ESOPHAGOGASTRIC FUNDOPLICATION  [id number]  spleen removed, 5 pints of blood given   EYE SURGERY     HERNIA REPAIR  1985   x4   INGUINAL HERNIA REPAIR  1992   PACEMAKER IMPLANT N/A 10/30/2018   Procedure: PACEMAKER IMPLANT;  Surgeon: CConstance Haw MD;  [facility];  Service: Cardiovascular;  Laterality: N/A;   PACEMAKER INSERTION     PPM GENERATOR REMOVAL N/A 10/25/2018   Procedure: PPM GENERATOR REMOVAL;  Surgeon: CConstance Haw MD;  [place] [facility];  Service:  Cardiovascular;  Laterality: N/A;   PROSTATE ABLATION  2013   "laser ablation"   RETINAL DETACHMENT SURGERY Right 01/24/2019   Pneumatic cryopexy - Dr. BBernarda Caffey  TEE WITHOUT CARDIOVERSION N/A 10/25/2018   Procedure: TRANSESOPHAGEAL ECHOCARDIOGRAM (TEE);  Surgeon: NAcie FredricksonPWonda Cheng MD;  [facility];  Service: Cardiovascular;  Laterality: N/A;   TEMPORARY PACEMAKER N/A 10/25/2018   Procedure: TEMPORARY PACEMAKER;  Surgeon: CConstance Haw MD;  [place] [facility];  Service: Cardiovascular;  Laterality: N/A;   TONSILLECTOMY  as child   TOTAL KNEE ARTHROPLASTY Right 08/03/2014   Procedure: TOTAL RIGHT KNEE ARTHROPLASTY;  Surgeon: FGearlean Alf MD;  Location: [facility];  Service: Orthopedics;  Laterality: Right;   TRANSURETHRAL RESECTION OF PROSTATE N/A 08/06/2012   Procedure: FAlmyra FreeOF PROSTATE WITH GYRUS ;  Surgeon: JMalka So MD;  Location: [facility];  Service: Urology;  Laterality: N/A;   UMBILICAL HERNIA REPAIR  1990   UVULECTOMY  2005   for sleep apnea   UVULOPALATOPHARYNGOPLASTY  6 years ago   Patient Active Problem List   Diagnosis Date Noted   Finger pain, right 04/17/2022   Cervico-occipital neuralgia of right side 04/17/2022   Chronic tension-type headache,  not intractable 02/27/2022   Postural kyphosis of cervicothoracic region 02/27/2022   Moderate aortic stenosis 10/27/2020   Thoracic aortic aneurysm ([place]) 03/12/2020   Sinus node dysfunction ([place]) 02/17/2020   Moderate persistent asthma without complication 85/63/1497   Chronic bronchitis, mucopurulent ([place]) 06/09/2019   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   Cervicalgia 04/01/2019   Degenerative disc disease, cervical 04/01/2019   Hypothyroidism    Presence of permanent cardiac pacemaker 10/23/2018   Sleep apnea 10/23/2018   PSA elevation 06/26/2017   Benign prostatic hyperplasia without lower urinary tract symptoms 05/21/2017   Seasonal allergic rhinitis due to pollen 05/18/2017   Chronic renal  disease, stage 3, moderately decreased glomerular filtration rate (GFR) between 30-59 mL/min/1.73 square meter ([place]) 02/63/7858   Systolic murmur 85/07/7739   Gastroesophageal reflux disease without esophagitis 12/19/2015   Essential hypertension, benign 12/19/2015   Hyperlipidemia with target LDL less than 70 12/19/2015   OA (osteoarthritis) of knee 08/03/2014   Coronary artery disease 07/10/2014     REFERRING PROVIDER: Durel Salts  REFERRING DIAG: Myofascial neck pain  THERAPY DIAG:  Cervicalgia  Abnormal posture  Rationale for Evaluation and Treatment: Rehabilitation  ONSET DATE: ~2 years.  SUBJECTIVE:                                                                                                                                                                                                         SUBJECTIVE STATEMENT: Had a lot of stress yesterday and pain was up and used heat a lot and it felt better.  Pain lower today. PERTINENT HISTORY:  PACEMAKER, HTN, COPD, right TKA, basal cell carcinoma (recent removal from right upper back region).  PAIN:  Are you having pain? Yes: NPRS scale: 4/10 Pain location: Right cervical Pain description: Ache, sharp. Aggravating factors: As above. Relieving factors: As above.  PRECAUTIONS: ICD/Pacemaker  PLOF: Independent  PATIENT GOALS: Reduce pain and headaches which will improve quality of life.  NEXT MD VISIT:   OBJECTIVE:  Trigger Point Dry-Needling  Treatment instructions: Expect mild to moderate muscle soreness. S/S of pneumothorax if dry needled over a lung field, and to seek immediate medical attention should they occur. Patient verbalized understanding of these instructions and education.  Patient Consent Given: Yes Education handout provided: Yes Muscles treated: Splenius capitus/cervicis.  Right side.  Treatment response/outcome: Twitch  f/b STW/M x 23 minutes to patient's affected cervical paraspinal  musculature, suboccipital and right upper thoracic region. HMP x 10 minutes to patient's right cervical region x 10 minutes.  Patient tolerated treatment without complaint with normal modality response  following removal of modality.   ASSESSMENT:  CLINICAL IMPRESSION: Patient overall very pleased with progress and states pain and headaches have been consistently lower since beginning PT.  He has responded well to dry needling. GOALS:  SHORT TERM GOALS: Target date: 07/17/2022   Ind with a HEP. Goal status: INITIAL   LONG TERM GOALS: Target date: 08/14/2022  Bilateral active cervical rotation to 60 degrees so he can turn head more easily while driving. Goal status:  Ongoing.  2.  Eliminate headaches. Goal status: Ongoing.  3.  Sit/stand 20 minutes with pain not > 2-3/10. Goal status: Ongoing.   PLAN:  PT FREQUENCY: 2x/week  PT DURATION: 6 weeks  PLANNED INTERVENTIONS: Therapeutic exercises, Therapeutic activity, Patient/Family education, Self Care, Dry Needling, Cryotherapy, Moist heat, Traction, and Manual therapy  PLAN FOR NEXT SESSION: STW/M, suboccipital release technique, manual traction with progression to intermittent traction beginning at 13#, postural exercises, dry needling.   Chattie Greeson, [place], PT 07/03/2022, 11:57 AM

## 2022-07-05 ENCOUNTER — Ambulatory Visit (INDEPENDENT_AMBULATORY_CARE_PROVIDER_SITE_OTHER): Payer: Medicare Other | Admitting: Internal Medicine

## 2022-07-05 ENCOUNTER — Encounter: Payer: Self-pay | Admitting: Internal Medicine

## 2022-07-05 VITALS — BP 118/60 | HR 77 | Temp 98.3°F | Ht 72.0 in | Wt 201.0 lb

## 2022-07-05 DIAGNOSIS — N400 Enlarged prostate without lower urinary tract symptoms: Secondary | ICD-10-CM

## 2022-07-05 DIAGNOSIS — E039 Hypothyroidism, unspecified: Secondary | ICD-10-CM

## 2022-07-05 DIAGNOSIS — E785 Hyperlipidemia, unspecified: Secondary | ICD-10-CM

## 2022-07-05 DIAGNOSIS — N1831 Chronic kidney disease, stage 3a: Secondary | ICD-10-CM

## 2022-07-05 DIAGNOSIS — R972 Elevated prostate specific antigen [PSA]: Secondary | ICD-10-CM | POA: Diagnosis not present

## 2022-07-05 DIAGNOSIS — R7303 Prediabetes: Secondary | ICD-10-CM | POA: Diagnosis not present

## 2022-07-05 DIAGNOSIS — I10 Essential (primary) hypertension: Secondary | ICD-10-CM | POA: Diagnosis not present

## 2022-07-05 LAB — CBC WITH DIFFERENTIAL/PLATELET
Basophils Absolute: 0 10*3/uL (ref 0.0–0.1)
Basophils Relative: 0.5 % (ref 0.0–3.0)
Eosinophils Absolute: 0.1 10*3/uL (ref 0.0–0.7)
Eosinophils Relative: 0.8 % (ref 0.0–5.0)
HCT: 47.3 % (ref 39.0–52.0)
Hemoglobin: 16 g/dL (ref 13.0–17.0)
Lymphocytes Relative: 24 % (ref 12.0–46.0)
Lymphs Abs: 1.9 10*3/uL (ref 0.7–4.0)
MCHC: 33.8 g/dL (ref 30.0–36.0)
MCV: 99.7 fl (ref 78.0–100.0)
Monocytes Absolute: 1 10*3/uL (ref 0.1–1.0)
Monocytes Relative: 13.3 % — ABNORMAL HIGH (ref 3.0–12.0)
Neutro Abs: 4.8 10*3/uL (ref 1.4–7.7)
Neutrophils Relative %: 61.4 % (ref 43.0–77.0)
Platelets: 228 10*3/uL (ref 150.0–400.0)
RBC: 4.75 Mil/uL (ref 4.22–5.81)
RDW: 17 % — ABNORMAL HIGH (ref 11.5–15.5)
WBC: 7.8 10*3/uL (ref 4.0–10.5)

## 2022-07-05 LAB — HEPATIC FUNCTION PANEL
ALT: 16 U/L (ref 0–53)
AST: 16 U/L (ref 0–37)
Albumin: 4.4 g/dL (ref 3.5–5.2)
Alkaline Phosphatase: 53 U/L (ref 39–117)
Bilirubin, Direct: 0.1 mg/dL (ref 0.0–0.3)
Total Bilirubin: 0.5 mg/dL (ref 0.2–1.2)
Total Protein: 6.7 g/dL (ref 6.0–8.3)

## 2022-07-05 LAB — LIPID PANEL
Cholesterol: 148 mg/dL (ref 0–200)
HDL: 45.8 mg/dL (ref >39.00)
NonHDL: 101.81
Total CHOL/HDL Ratio: 3
Triglycerides: 242 mg/dL — ABNORMAL HIGH (ref 0.0–149.0)
VLDL: 48.4 mg/dL — ABNORMAL HIGH (ref 0.0–40.0)

## 2022-07-05 LAB — BASIC METABOLIC PANEL
BUN: 22 mg/dL (ref 6–23)
CO2: 26 mEq/L (ref 19–32)
Calcium: 10.1 mg/dL (ref 8.4–10.5)
Chloride: 106 mEq/L (ref 96–112)
Creatinine, Ser: 1.28 mg/dL (ref 0.40–1.50)
GFR: 52.31 mL/min — ABNORMAL LOW (ref >60.00)
Glucose, Bld: 98 mg/dL (ref 70–99)
Potassium: 4.1 mEq/L (ref 3.5–5.1)
Sodium: 141 mEq/L (ref 135–145)

## 2022-07-05 LAB — LDL CHOLESTEROL, DIRECT: Direct LDL: 75 mg/dL

## 2022-07-05 LAB — PSA: PSA: 4.83 ng/mL — ABNORMAL HIGH (ref 0.10–4.00)

## 2022-07-05 LAB — TSH: TSH: 0.71 u[IU]/mL (ref 0.35–5.50)

## 2022-07-05 NOTE — Patient Instructions (Signed)
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
## 2022-07-06 DIAGNOSIS — C44519 Basal cell carcinoma of skin of other part of trunk: Secondary | ICD-10-CM | POA: Diagnosis not present

## 2022-07-06 DIAGNOSIS — L821 Other seborrheic keratosis: Secondary | ICD-10-CM | POA: Diagnosis not present

## 2022-07-06 DIAGNOSIS — D485 Neoplasm of uncertain behavior of skin: Secondary | ICD-10-CM | POA: Diagnosis not present

## 2022-07-06 DIAGNOSIS — Z85828 Personal history of other malignant neoplasm of skin: Secondary | ICD-10-CM | POA: Diagnosis not present

## 2022-07-06 DIAGNOSIS — Z85820 Personal history of malignant melanoma of skin: Secondary | ICD-10-CM | POA: Diagnosis not present

## 2022-07-06 DIAGNOSIS — L570 Actinic keratosis: Secondary | ICD-10-CM | POA: Diagnosis not present

## 2022-07-06 LAB — URINALYSIS, ROUTINE W REFLEX MICROSCOPIC
Bilirubin Urine: NEGATIVE
Hgb urine dipstick: NEGATIVE
Ketones, ur: NEGATIVE
Leukocytes,Ua: NEGATIVE
Nitrite: NEGATIVE
RBC / HPF: NONE SEEN (ref 0–?)
Specific Gravity, Urine: 1.015 (ref 1.000–1.030)
Urine Glucose: NEGATIVE
Urobilinogen, UA: 0.2 (ref 0.0–1.0)
pH: 6 (ref 5.0–8.0)

## 2022-07-06 LAB — HEMOGLOBIN A1C: Hgb A1c MFr Bld: 5.8 % (ref 4.6–6.5)

## 2022-07-10 ENCOUNTER — Encounter: Payer: Self-pay | Admitting: Physical Therapy

## 2022-07-10 ENCOUNTER — Ambulatory Visit: Payer: Medicare Other | Attending: Physical Medicine and Rehabilitation | Admitting: Physical Therapy

## 2022-07-10 DIAGNOSIS — M542 Cervicalgia: Secondary | ICD-10-CM | POA: Diagnosis not present

## 2022-07-10 DIAGNOSIS — R293 Abnormal posture: Secondary | ICD-10-CM | POA: Insufficient documentation

## 2022-07-10 NOTE — Therapy (Addendum)
[name] OUTPATIENT PHYSICAL THERAPY CERVICAL TREATMENT   Patient Name: [name] Glass MRN: [id number] DOB:Jun 01, 1941, 82 y.o., male Today's Date: 07/03/2022   PT End of Session - 07/03/22 1155     Visit Number 12    Number of Visits 18    Date for PT Re-Evaluation 07/31/22    PT Start Time 46    PT Stop Time [id number]    PT Time Calculation (min) 30 min    Activity Tolerance Patient tolerated treatment well    Behavior During Therapy [facility] for tasks assessed/performed             Past Medical History:  Diagnosis Date   Arthritis    Basal cell carcinoma 02/17/2020   infl & ulcerated-Left forehead (MOHS)   Coronary artery disease    GERD (gastroesophageal reflux disease)    H/O bronchitis    H/O hiatal hernia    History of kidney stones last in 1976   Hyperlipidemia    statin intolerant, under control   Hypertension    under control   Hypertensive retinopathy    OU   Hypothyroidism    Moderate persistent asthma without complication [id number]   Retinal detachment    OD   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   left helix tx cx3 74f   Sleep apnea    hx of, surgery to reverse   Past Surgical History:  Procedure Laterality Date   CARDIAC CATHETERIZATION Right 2008   5 heart stents   CATARACT EXTRACTION Bilateral 6 years ago   CYSTOSCOPY N/A 08/06/2012   Procedure: CYSTOSCOPY;  Surgeon: JMalka So MD;  [facility];  Service: Urology;  Laterality: N/A;   ESOPHAGOGASTRIC FUNDOPLICATION  [id number]  spleen removed, 5 pints of blood given   EYE SURGERY     HERNIA REPAIR  1985   x4   INGUINAL HERNIA REPAIR  1992   PACEMAKER IMPLANT N/A 10/30/2018   Procedure: PACEMAKER IMPLANT;  Surgeon: CConstance Haw MD;  [place] [facility] [place];  Service: Cardiovascular;  Laterality: N/A;   PACEMAKER INSERTION     PPM GENERATOR REMOVAL N/A 10/25/2018   Procedure: PPM GENERATOR REMOVAL;  Surgeon: CConstance Haw MD;  [place];  Service:  Cardiovascular;  Laterality: N/A;   PROSTATE ABLATION  2013   "laser ablation"   RETINAL DETACHMENT SURGERY Right 01/24/2019   Pneumatic cryopexy - Dr. BBernarda Caffey  TEE WITHOUT CARDIOVERSION N/A 10/25/2018   Procedure: TRANSESOPHAGEAL ECHOCARDIOGRAM (TEE);  Surgeon: NAcie FredricksonPWonda Cheng MD;  [facility];  Service: Cardiovascular;  Laterality: N/A;   TEMPORARY PACEMAKER N/A 10/25/2018   Procedure: TEMPORARY PACEMAKER;  Surgeon: CConstance Haw MD;  [place];  Service: Cardiovascular;  Laterality: N/A;   TONSILLECTOMY  as child   TOTAL KNEE ARTHROPLASTY Right 08/03/2014   Procedure: TOTAL RIGHT KNEE ARTHROPLASTY;  Surgeon: FGearlean Alf MD;  [facility];  Service: Orthopedics;  Laterality: Right;   TRANSURETHRAL RESECTION OF PROSTATE N/A 08/06/2012   Procedure: FAlmyra FreeOF PROSTATE WITH GYRUS ;  Surgeon: JMalka So MD;  [facility];  Service: Urology;  Laterality: N/A;   UMBILICAL HERNIA REPAIR  1990   UVULECTOMY  2005   for sleep apnea   UVULOPALATOPHARYNGOPLASTY  6 years ago   Patient Active Problem List   Diagnosis Date Noted   Finger pain, right 04/17/2022   Cervico-occipital neuralgia of right side 04/17/2022   Chronic tension-type headache,  not intractable 02/27/2022   Postural kyphosis of cervicothoracic region 02/27/2022   Moderate aortic stenosis 10/27/2020   Thoracic aortic aneurysm ([place]) 03/12/2020   Sinus node dysfunction ([place]) 02/17/2020   Moderate persistent asthma without complication [id number]   Chronic bronchitis, mucopurulent ([place]) 06/09/2019   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   Cervicalgia 04/01/2019   Degenerative disc disease, cervical 04/01/2019   Hypothyroidism    Presence of permanent cardiac pacemaker 10/23/2018   Sleep apnea 10/23/2018   PSA elevation 06/26/2017   Benign prostatic hyperplasia without lower urinary tract symptoms 05/21/2017   Seasonal allergic rhinitis due to pollen 05/18/2017   Chronic renal  disease, stage 3, moderately decreased glomerular filtration rate (GFR) between 30-59 mL/min/1.73 square meter ([place]) [id number]   Systolic murmur [id number]   Gastroesophageal reflux disease without esophagitis 12/19/2015   Essential hypertension, benign 12/19/2015   Hyperlipidemia with target LDL less than 70 12/19/2015   OA (osteoarthritis) of knee 08/03/2014   Coronary artery disease 07/10/2014     REFERRING PROVIDER: Durel Salts  REFERRING DIAG: Myofascial neck pain  THERAPY DIAG:  Cervicalgia  Abnormal posture  Rationale for Evaluation and Treatment: Rehabilitation  ONSET DATE: ~2 years.  SUBJECTIVE:                                                                                                                                                                                                         SUBJECTIVE STATEMENT: Pain at a 2 today.  Requesting no dry needling today but wanting STW/M. PERTINENT HISTORY:  PACEMAKER, HTN, COPD, right TKA, basal cell carcinoma (recent removal from right upper back region).  PAIN:  Are you having pain? Yes: NPRS scale: 2/10 Pain location: Right cervical Pain description: Ache, sharp. Aggravating factors: As above. Relieving factors: As above.  PRECAUTIONS: ICD/Pacemaker  PLOF: Independent  PATIENT GOALS: Reduce pain and headaches which will improve quality of life.  NEXT MD VISIT:   OBJECTIVE:    STW/M x 23 minutes to patient's affected cervical paraspinal musculature, suboccipital and right upper thoracic region. HMP x 15 minutes to patient's right cervical and right thoracic region.  Patient tolerated treatment without complaint with normal modality response following removal of modality.   ASSESSMENT:  CLINICAL IMPRESSION:  Excellent overall progress.  No dry needling per patient request.  He has a table now to read from that can be adjusted for correct posture.  We discussed the importance of continuing to perform his  HEP.   GOALS:  SHORT TERM GOALS: Target date: 07/17/2022   Ind with a HEP. Goal status: INITIAL  LONG TERM GOALS: Target date: 08/14/2022  Bilateral active cervical rotation to 60 degrees so he can turn head more easily while driving. Goal status:  Ongoing.  2.  Eliminate headaches. Goal status: Ongoing.  3.  Sit/stand 20 minutes with pain not > 2-3/10. Goal status: Ongoing.   PLAN:  PT FREQUENCY: 2x/week  PT DURATION: 6 weeks  PLANNED INTERVENTIONS: Therapeutic exercises, Therapeutic activity, Patient/Family education, Self Care, Dry Needling, Cryotherapy, Moist heat, Traction, and Manual therapy  PLAN FOR NEXT SESSION: STW/M, suboccipital release technique, manual traction with progression to intermittent traction beginning at 13#, postural exercises, dry needling.   Ahsley Attwood, [place], PT 07/03/2022, 11:57 AM

## 2022-07-11 NOTE — Progress Notes (Signed)
Subjective:    Patient ID: [name] Glass, male    DOB: [birth year], 82 y.o.   MRN: [id number]  HPI  [name] Glass is a 82 y.o. 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   Plan from last visit: Finger pain, right Assessment & Plan: Chronic with reduced ROM and pain in R 2nd MCP joint; likely OA vs. Less likely Inflammatory arthopathy such as RA, Gout   Will order xray to evaluate; will call and discuss results, may require outside referral for possible joint aspiration/injection. In the interim, can also use voltaren gel on hand.     Cervicalgia Assessment & Plan: Continue PT, voltaren gel. Limited perceived benefit of TPI; can still trial dry needling with PT to see of adjunctive treatments are helpful.   Follow up in 3 months   Cervico-occipital neuralgia of right side Assessment & Plan: Performed R GOND and LONB today as above; will follow up in 3 months to evaluate duration/extent of benefit.   Also agree traction with PT may be helpful modality.    Continue voltaren gel PRN     Degenerative disc disease, cervical Assessment & Plan: CT neck 10/13/20; appreciate spondylosis along with multi-level decreased disc height and anterior fusion throughout lower cervical and upper thoracic spine.   If pain uncontrollable with non-invasive measures, can consider  referral to interventional pain for nerve blocks. Will hold off for now with trial of conservative treatments.     Postural kyphosis of cervicothoracic region Assessment & Plan: Continue working with PT on stretching, posture, and development of HEP.    Encouraged ongoing work on posture, as this will improve with consistent effort/attention. Discussed ergonomic changes to encourage good posture.  Interval Hx: - "I'm doing better, certainly not well". If he is under stress things get worse; he pushed through a lunch appointment, cut a massage short, and went to the ball game. He states 6-7 hours straight of stress, wich made him get painful before bedtime.  - Pain is at 2-3/10 daily; improved from 7-8/10 when he initially came here - Pain still extending from neck into the back of his heads and behind his eyes, R>L. Does think the GONB helped, and massage therapy has improved it quite a bit.  - He is getting dry needling every week; he states they make his eyes burn and his back ache. - Sleeping well, wears CPAP and feel well rested during the day.   Pain Inventory Average Pain 6 Pain Right Now 3 My pain is constant, sharp, stabbing, and aching  In the last 24 hours, has pain interfered with the following? General activity 3 Relation with others 1 Enjoyment of life 5 What TIME of day is your pain at its worst? varies Sleep (in general) Good  Pain is worse with: standing Pain improves with: therapy/exercise, medication, and heat Relief from Meds:  unknown  Family History  Problem Relation Age of Onset   Heart attack Father 54       lived to 39  Alzheimer's disease Mother    Stroke Mother    Breast cancer Mother    Alzheimer's disease Maternal Grandmother    Stroke Maternal Grandmother    Lung cancer Maternal Grandmother    Lung cancer Maternal Grandfather    Cancer Paternal Grandmother        type unknown   Diabetes Paternal Grandfather    Other Paternal Grandfather         Growth in the back of the head   Social History   Socioeconomic History   Marital status: Married    Spouse name: Almyra Free   Number of children: 0   Years of education: Not on file   Highest education level: Not on file  Occupational History   Occupation: Retired   Tobacco Use   Smoking status: Never   Smokeless tobacco: Never  Vaping Use   Vaping Use: Never used  Substance and Sexual Activity   Alcohol use: Yes    Alcohol/week: 4.0 standard drinks of alcohol    Types: 4 Standard drinks or equivalent per week    Comment: 3 drinks at night    Drug use: No   Sexual activity: Not Currently  Other Topics Concern   Not on file  Social History Narrative   Not on file   Social Determinants of Health   Financial Resource Strain: Low Risk  (05/01/2022)   Overall Financial Resource Strain (CARDIA)    Difficulty of Paying Living Expenses: Not hard at all  Food Insecurity: No Food Insecurity (05/01/2022)   Hunger Vital Sign    Worried About Running Out of Food in the Last Year: Never true    Ran Out of Food in the Last Year: Never true  Transportation Needs: No Transportation Needs (05/01/2022)   PRAPARE - Hydrologist (Medical): No    Lack of Transportation (Non-Medical): No  Physical Activity: Sufficiently Active (05/01/2022)   Exercise Vital Sign    Days of Exercise per Week: 5 days    Minutes of Exercise per Session: 30 min  Stress: No Stress Concern Present (05/01/2022)   [place]    Feeling of Stress : Not at all  Social Connections: Moderately Integrated (05/01/2022)   Social Connection and Isolation Panel [NHANES]    Frequency of Communication with Friends and Family: More than three times a week    Frequency of Social Gatherings with Friends and Family: More than three times a week    Attends Religious Services: Never    Marine scientist or Organizations: Yes     Attends Archivist Meetings: More than 4 times per year    Marital Status: Married   Past Surgical History:  Procedure Laterality Date   CARDIAC CATHETERIZATION Right 2008   5 heart stents   CATARACT EXTRACTION Bilateral 6 years ago   CYSTOSCOPY N/A 08/06/2012   Procedure: CYSTOSCOPY;  Surgeon: [name], MD;  [facility];  Service: Urology;  Laterality: N/A;   ESOPHAGOGASTRIC FUNDOPLICATION  [id number]   spleen removed, 5 pints of blood given   EYE SURGERY     HERNIA REPAIR  1985   x4   INGUINAL HERNIA REPAIR  1992   PACEMAKER IMPLANT N/A 10/30/2018   Procedure: PACEMAKER IMPLANT;  Surgeon: Constance Haw, MD;  Location: [place] [facility];  Service: Cardiovascular;  Laterality: N/A;   PACEMAKER INSERTION     PPM GENERATOR REMOVAL N/A 10/25/2018  Procedure: PPM GENERATOR REMOVAL;  Surgeon: Constance Haw, MD;  Location: [place] [facility];  Service: Cardiovascular;  Laterality: N/A;   PROSTATE ABLATION  2013   "laser ablation"   RETINAL DETACHMENT SURGERY Right 01/24/2019   Pneumatic cryopexy - Dr. Bernarda Caffey   TEE WITHOUT CARDIOVERSION N/A 10/25/2018   Procedure: TRANSESOPHAGEAL ECHOCARDIOGRAM (TEE);  Surgeon: Acie Fredrickson Wonda Cheng, MD;  Location: [facility];  Service: Cardiovascular;  Laterality: N/A;   TEMPORARY PACEMAKER N/A 10/25/2018   Procedure: TEMPORARY PACEMAKER;  Surgeon: Constance Haw, MD;  Location: [place] [facility];  Service: Cardiovascular;  Laterality: N/A;   TONSILLECTOMY  as child   TOTAL KNEE ARTHROPLASTY Right 08/03/2014   Procedure: TOTAL RIGHT KNEE ARTHROPLASTY;  Surgeon: [name], MD;  [facility];  Service: Orthopedics;  Laterality: Right;   TRANSURETHRAL RESECTION OF PROSTATE N/A 08/06/2012   Procedure: Almyra Free OF PROSTATE WITH GYRUS ;  Surgeon: [name], MD;  [facility];  Service: Urology;  Laterality: N/A;   UMBILICAL HERNIA REPAIR  1990   UVULECTOMY  2005   for sleep apnea   UVULOPALATOPHARYNGOPLASTY   6 years ago   Past Surgical History:  Procedure Laterality Date   CARDIAC CATHETERIZATION Right 2008   5 heart stents   CATARACT EXTRACTION Bilateral 6 years ago   CYSTOSCOPY N/A 08/06/2012   Procedure: CYSTOSCOPY;  Surgeon: [name], MD;  [facility];  Service: Urology;  Laterality: N/A;   ESOPHAGOGASTRIC FUNDOPLICATION  Q000111Q   spleen removed, 5 pints of blood given   EYE SURGERY     HERNIA REPAIR  1985   x4   INGUINAL HERNIA REPAIR  1992   PACEMAKER IMPLANT N/A 10/30/2018   Procedure: PACEMAKER IMPLANT;  Surgeon: Constance Haw, MD;  Location: [employer] [facility];  Service: Cardiovascular;  Laterality: N/A;   PACEMAKER INSERTION     PPM GENERATOR REMOVAL N/A 10/25/2018   Procedure: PPM GENERATOR REMOVAL;  Surgeon: Constance Haw, MD;  Location: [place] [facility];  Service: Cardiovascular;  Laterality: N/A;   PROSTATE ABLATION  2013   "laser ablation"   RETINAL DETACHMENT SURGERY Right 01/24/2019   Pneumatic cryopexy - Dr. Bernarda Caffey   TEE WITHOUT CARDIOVERSION N/A 10/25/2018   Procedure: TRANSESOPHAGEAL ECHOCARDIOGRAM (TEE);  Surgeon: Acie Fredrickson Wonda Cheng, MD;  [facility];  Service: Cardiovascular;  Laterality: N/A;   TEMPORARY PACEMAKER N/A 10/25/2018   Procedure: TEMPORARY PACEMAKER;  Surgeon: Constance Haw, MD;  Location: [place] [facility];  Service: Cardiovascular;  Laterality: N/A;   TONSILLECTOMY  as child   TOTAL KNEE ARTHROPLASTY Right 08/03/2014   Procedure: TOTAL RIGHT KNEE ARTHROPLASTY;  Surgeon: [name], MD;  [facility];  Service: Orthopedics;  Laterality: Right;   TRANSURETHRAL RESECTION OF PROSTATE N/A 08/06/2012   Procedure: Almyra Free OF PROSTATE WITH GYRUS ;  Surgeon: [name], MD;  [facility];  Service: Urology;  Laterality: N/A;   UMBILICAL HERNIA REPAIR  1990   UVULECTOMY  2005   for sleep apnea   UVULOPALATOPHARYNGOPLASTY  6 years ago   Past Medical History:  Diagnosis Date   Arthritis    Basal cell  carcinoma 02/17/2020   infl & ulcerated-Left forehead (MOHS)   Coronary artery disease    GERD (gastroesophageal reflux disease)    H/O bronchitis    H/O hiatal hernia    History of kidney stones last in 1976   Hyperlipidemia    statin intolerant, under control  Hypertension    under control   Hypertensive retinopathy    OU   Hypothyroidism    Moderate persistent asthma without complication [id number]   Retinal detachment    OD   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   left helix tx cx3 72f   Sleep apnea    hx of, surgery to reverse   There were no vitals taken for this visit.  Opioid Risk Score:   Fall Risk Score:  `1  Depression screen PHQ 2/9     05/01/2022    2:12 PM 04/25/2021    9:56 AM 04/25/2021    9:53 AM 02/10/2020    2:40 PM 12/22/2019   11:29 AM 01/02/2019   11:09 AM 06/27/2018    3:46 PM  Depression screen PHQ 2/9  Decreased Interest 0 0 0 0 0 0 0  Down, Depressed, Hopeless 0 0 0 0 0 0 0  PHQ - 2 Score 0 0 0 0 0 0 0    Review of Systems  Musculoskeletal:  Positive for neck pain.  All other systems reviewed and are negative.      Objective:   Physical Exam  Physical Exam:  General: Appropriate appearance for age.  Mental Status: Appropriate mood and affect.  Cardiovascular: RRR, no m/r/g.  Respiratory: CTAB, no rales/rhonchi/wheezing.  Skin: No apparent rashes or lesions.  Neuro: Awake, alert, and oriented x3. No apparent deficits.  MSK: Moving all 4 limbs antigravity and against resistance.  Neck: AROM still extremely limited in extension, bilateral rotation and bilateral sidebending; notably increased ROM in all planes since last assessment.   + shooting pain with headache symptoms reproduced with palpation of R greater occipital nerve, mild on R lesser, none on L        Assessment & Plan:   [name] MCDORMANis a 82y.o. year old male  who  has a past medical history of Arthritis, Basal cell carcinoma (02/17/2020), Coronary artery  disease, GERD (gastroesophageal reflux disease), H/O bronchitis, H/O hiatal hernia, History of kidney stones (last in 1976), Hyperlipidemia, Hypertension, Hypertensive retinopathy, Hypothyroidism, Moderate persistent asthma without complication ([id number], Retinal detachment, SCCA (squamous cell carcinoma) of skin (04/18/2016), and Sleep apnea.   They are presenting to PM&R clinic as a follow up patient for treatment of neck pain and headaches .   Cervicalgia Assessment & Plan: Continue with PT for stretching and massage. Stop weekly dry needling if it is not benefiting you.  Take half to one full tab of Tizanidine at nighttime for excessive neck pain and spasm. Call clinic if any concerning side effects.   Follow up in 6 weeks   Muscle spasms of neck  Cervico-occipital neuralgia of right side Assessment & Plan: Follow up in 1-2 weeks for trigger point injections and occipital nerve injection the right.      Postural kyphosis of cervicothoracic region Assessment & Plan: Discussed improved ergonomics; he is now doing better with reading on eye level.   Discussed moving TV to eye level as well, instead of looking down.     Other orders -     tiZANidine HCl; Take 0.5-1 tablets (2-4 mg total) by mouth at bedtime as needed for muscle spasms (for neck pain and tightness).  Dispense: 30 tablet; Refill: 0

## 2022-07-12 ENCOUNTER — Encounter: Payer: Self-pay | Admitting: Physical Medicine and Rehabilitation

## 2022-07-12 ENCOUNTER — Encounter
Payer: Medicare Other | Attending: Physical Medicine and Rehabilitation | Admitting: Physical Medicine and Rehabilitation

## 2022-07-12 VITALS — BP 117/69 | HR 64 | Ht 72.0 in | Wt 189.0 lb

## 2022-07-12 DIAGNOSIS — M4003 Postural kyphosis, cervicothoracic region: Secondary | ICD-10-CM | POA: Insufficient documentation

## 2022-07-12 DIAGNOSIS — M5481 Occipital neuralgia: Secondary | ICD-10-CM | POA: Diagnosis not present

## 2022-07-12 DIAGNOSIS — M62838 Other muscle spasm: Secondary | ICD-10-CM | POA: Insufficient documentation

## 2022-07-12 DIAGNOSIS — M542 Cervicalgia: Secondary | ICD-10-CM | POA: Insufficient documentation

## 2022-07-12 MED ORDER — TIZANIDINE HCL 4 MG PO TABS: 2.0000 mg | ORAL_TABLET | Freq: Every evening | ORAL | 0 refills | Status: DC | PRN

## 2022-07-12 NOTE — Patient Instructions (Addendum)
Continue with PT for stretching and massage. Stop weekly dry needling if it is not benefiting you.  Follow up in 1-2 weeks for trigger point injections and occipital nerve injection the right.   Take half to one full tab of Tizanidine at nighttime for excessive neck pain and spasm. Call clinic if any concerning side effects.   Follow up in 6 weeks

## 2022-07-17 ENCOUNTER — Ambulatory Visit: Payer: Medicare Other | Admitting: Physical Therapy

## 2022-07-17 DIAGNOSIS — R293 Abnormal posture: Secondary | ICD-10-CM

## 2022-07-17 DIAGNOSIS — M542 Cervicalgia: Secondary | ICD-10-CM | POA: Diagnosis not present

## 2022-07-17 NOTE — Assessment & Plan Note (Signed)
Discussed improved ergonomics; he is now doing better with reading on eye level.   Discussed moving TV to eye level as well, instead of looking down.

## 2022-07-17 NOTE — Therapy (Addendum)
[name] OUTPATIENT PHYSICAL THERAPY CERVICAL TREATMENT   Patient Name: [name] Glass MRN: [id number] DOB:09-Oct-1940, 82 y.o., male Today's Date: 07/17/2022   PT End of Session - 07/17/22 1329     Visit Number 14    Number of Visits 18    Date for PT Re-Evaluation 07/31/22    Authorization Type FOTO AT LEAST EVERY 5TH VISIT.  PROGRESS NOTE AT 10TH VISIT.  KX MODIFIER AFTER 15 VISITS.    PT Start Time 0100    PT Stop Time 0141    PT Time Calculation (min) 41 min    Activity Tolerance Patient tolerated treatment well    Behavior During Therapy WFL for tasks assessed/performed             Past Medical History:  Diagnosis Date   Arthritis    Basal cell carcinoma 02/17/2020   infl & ulcerated-Left forehead ([facility])   Coronary artery disease    GERD (gastroesophageal reflux disease)    H/O bronchitis    H/O hiatal hernia    History of kidney stones last in 1976   Hyperlipidemia    statin intolerant, under control   Hypertension    under control   Hypertensive retinopathy    OU   Hypothyroidism    Moderate persistent asthma without complication [id number]   Retinal detachment    OD   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   left helix tx cx3 68f   Sleep apnea    hx of, surgery to reverse   Past Surgical History:  Procedure Laterality Date   CARDIAC CATHETERIZATION Right 2008   5 heart stents   CATARACT EXTRACTION Bilateral 6 years ago   CYSTOSCOPY N/A 08/06/2012   Procedure: CYSTOSCOPY;  Surgeon: JMalka So MD;  Location: [facility];  Service: Urology;  Laterality: N/A;   ESOPHAGOGASTRIC FUNDOPLICATION  [id number]  spleen removed, 5 pints of blood given   EYE SURGERY     HERNIA REPAIR  1985   x4   INGUINAL HERNIA REPAIR  1992   PACEMAKER IMPLANT N/A 10/30/2018   Procedure: PACEMAKER IMPLANT;  Surgeon: CConstance Haw MD;  [facility];  Service: Cardiovascular;  Laterality: N/A;   PACEMAKER INSERTION     PPM GENERATOR REMOVAL N/A 10/25/2018    Procedure: PPM GENERATOR REMOVAL;  Surgeon: CConstance Haw MD;  [facility];  Service: Cardiovascular;  Laterality: N/A;   PROSTATE ABLATION  2013   "laser ablation"   RETINAL DETACHMENT SURGERY Right 01/24/2019   Pneumatic cryopexy - Dr. BBernarda Caffey  TEE WITHOUT CARDIOVERSION N/A 10/25/2018   Procedure: TRANSESOPHAGEAL ECHOCARDIOGRAM (TEE);  Surgeon: NAcie FredricksonPWonda Cheng MD;  [facility];  Service: Cardiovascular;  Laterality: N/A;   TEMPORARY PACEMAKER N/A 10/25/2018   Procedure: TEMPORARY PACEMAKER;  Surgeon: CConstance Haw MD;  [facility];  Service: Cardiovascular;  Laterality: N/A;   TONSILLECTOMY  as child   TOTAL KNEE ARTHROPLASTY Right 08/03/2014   Procedure: TOTAL RIGHT KNEE ARTHROPLASTY;  Surgeon: FGearlean Alf MD;  Location: [facility];  Service: Orthopedics;  Laterality: Right;   TRANSURETHRAL RESECTION OF PROSTATE N/A 08/06/2012   Procedure: FAlmyra FreeOF PROSTATE WITH GYRUS ;  Surgeon: JMalka So MD;  Location: [facility];  Service: Urology;  Laterality: N/A;   UMBILICAL HERNIA REPAIR  1990   UVULECTOMY  2005   for sleep apnea   UVULOPALATOPHARYNGOPLASTY  6 years ago   Patient Active Problem List  Diagnosis Date Noted   Prediabetes 07/05/2022   Cervico-occipital neuralgia of right side 04/17/2022   Chronic tension-type headache, not intractable 02/27/2022   Postural kyphosis of cervicothoracic region 02/27/2022   Moderate aortic stenosis 10/27/2020   Thoracic aortic aneurysm ([place]) 03/12/2020   Sinus node dysfunction ([place]) 02/17/2020   Moderate persistent asthma without complication [id number]   Chronic bronchitis, mucopurulent ([place]) 06/09/2019   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   Cervicalgia 04/01/2019   Degenerative disc disease, cervical 04/01/2019   Hypothyroidism    Presence of permanent cardiac pacemaker 10/23/2018   Sleep apnea 10/23/2018   PSA elevation 06/26/2017   Benign prostatic hyperplasia without lower  urinary tract symptoms 05/21/2017   Seasonal allergic rhinitis due to pollen 05/18/2017   Chronic renal disease, stage 3, moderately decreased glomerular filtration rate (GFR) between 30-59 mL/min/1.73 square meter ([place]) 06/14/2016   Gastroesophageal reflux disease without esophagitis 12/19/2015   Essential hypertension, benign 12/19/2015   Hyperlipidemia with target LDL less than 70 12/19/2015   OA (osteoarthritis) of knee 08/03/2014   Coronary artery disease 07/10/2014     REFERRING PROVIDER: Durel Salts  REFERRING DIAG: Myofascial neck pain  THERAPY DIAG:  Cervicalgia  Abnormal posture  Rationale for Evaluation and Treatment: Rehabilitation  ONSET DATE: ~2 years.  SUBJECTIVE:                                                                                                                                                                                                         SUBJECTIVE STATEMENT: Pain staying at a 2 today.  Long drive back [place].  Neck did well. PERTINENT HISTORY:  PACEMAKER, HTN, COPD, right TKA, basal cell carcinoma (recent removal from right upper back region).  PAIN:  Are you having pain? Yes: NPRS scale: 2/10 Pain location: Right cervical Pain description: Ache, sharp. Aggravating factors: As above. Relieving factors: As above.  PRECAUTIONS: ICD/Pacemaker  PLOF: Independent  PATIENT GOALS: Reduce pain and headaches which will improve quality of life.  NEXT MD VISIT:   OBJECTIVE:     STW/M x 23 minutes total..began with patient seated then in supine for continued STW/M to right cervical region, manual traction and gentle cervical range of motion f/b HMP x 15 minutes. ASSESSMENT:  CLINICAL IMPRESSION:  Excellent overall progress.  Long drive over the weekend and neck did well.  Good response to manual traction today. FOTO score has improved from a 42 to 52.   GOALS:  SHORT TERM GOALS: Target date: 07/31/2022   Ind  with a HEP. Goal status: INITIAL  LONG TERM GOALS: Target date: 08/28/2022  Bilateral active cervical rotation to 60 degrees so he can turn head more easily while driving. Goal status:  Ongoing.  2.  Eliminate headaches. Goal status: Ongoing.  3.  Sit/stand 20 minutes with pain not > 2-3/10. Goal status: Ongoing.   PLAN:  PT FREQUENCY: 2x/week  PT DURATION: 6 weeks  PLANNED INTERVENTIONS: Therapeutic exercises, Therapeutic activity, Patient/Family education, Self Care, Dry Needling, Cryotherapy, Moist heat, Traction, and Manual therapy  PLAN FOR NEXT SESSION: STW/M, suboccipital release technique, manual traction with progression to intermittent traction beginning at 13#, postural exercises, dry needling.   Lamond Glantz, [place], PT 07/17/2022, 1:50 PM  PHYSICAL THERAPY DISCHARGE SUMMARY  Visits from Start of Care: 14.  Current functional level related to goals / functional outcomes: See above.   Remaining deficits: Continued neck pain but good response to treatments.   Education / Equipment: HEP.   Patient agrees to discharge. Patient goals were not met. Patient is being discharged due to being pleased with the current functional level.    Mali Amor Packard MPT

## 2022-07-17 NOTE — Assessment & Plan Note (Signed)
Continue with PT for stretching and massage. Stop weekly dry needling if it is not benefiting you.  Take half to one full tab of Tizanidine at nighttime for excessive neck pain and spasm. Call clinic if any concerning side effects.   Follow up in 6 weeks

## 2022-07-17 NOTE — Assessment & Plan Note (Signed)
Follow up in 1-2 weeks for trigger point injections and occipital nerve injection the right.

## 2022-07-20 ENCOUNTER — Other Ambulatory Visit: Payer: Self-pay | Admitting: Internal Medicine

## 2022-07-20 DIAGNOSIS — I2510 Atherosclerotic heart disease of native coronary artery without angina pectoris: Secondary | ICD-10-CM

## 2022-07-20 DIAGNOSIS — I10 Essential (primary) hypertension: Secondary | ICD-10-CM

## 2022-07-24 ENCOUNTER — Other Ambulatory Visit: Payer: Self-pay | Admitting: Internal Medicine

## 2022-07-24 DIAGNOSIS — I2510 Atherosclerotic heart disease of native coronary artery without angina pectoris: Secondary | ICD-10-CM

## 2022-07-24 DIAGNOSIS — E785 Hyperlipidemia, unspecified: Secondary | ICD-10-CM

## 2022-07-25 ENCOUNTER — Ambulatory Visit: Payer: Medicare Other | Admitting: Physical Therapy

## 2022-07-26 ENCOUNTER — Encounter: Payer: Self-pay | Admitting: Physical Medicine and Rehabilitation

## 2022-07-26 ENCOUNTER — Other Ambulatory Visit: Payer: Self-pay | Admitting: Internal Medicine

## 2022-07-26 ENCOUNTER — Encounter (HOSPITAL_BASED_OUTPATIENT_CLINIC_OR_DEPARTMENT_OTHER): Payer: Medicare Other | Admitting: Physical Medicine and Rehabilitation

## 2022-07-26 ENCOUNTER — Ambulatory Visit (INDEPENDENT_AMBULATORY_CARE_PROVIDER_SITE_OTHER): Payer: Medicare Other

## 2022-07-26 VITALS — BP 131/79 | HR 64 | Temp 97.6°F | Ht 72.0 in | Wt 197.0 lb

## 2022-07-26 DIAGNOSIS — M542 Cervicalgia: Secondary | ICD-10-CM | POA: Diagnosis not present

## 2022-07-26 DIAGNOSIS — M4003 Postural kyphosis, cervicothoracic region: Secondary | ICD-10-CM | POA: Diagnosis not present

## 2022-07-26 DIAGNOSIS — M5481 Occipital neuralgia: Secondary | ICD-10-CM | POA: Diagnosis not present

## 2022-07-26 DIAGNOSIS — M62838 Other muscle spasm: Secondary | ICD-10-CM | POA: Diagnosis not present

## 2022-07-26 DIAGNOSIS — E785 Hyperlipidemia, unspecified: Secondary | ICD-10-CM

## 2022-07-26 DIAGNOSIS — I495 Sick sinus syndrome: Secondary | ICD-10-CM

## 2022-07-26 LAB — CUP PACEART REMOTE DEVICE CHECK
Battery Remaining Longevity: 130 mo
Battery Voltage: 3.02 V
Brady Statistic AP VP Percent: 0.44 %
Brady Statistic AP VS Percent: 31.52 %
Brady Statistic AS VP Percent: 0.57 %
Brady Statistic AS VS Percent: 67.48 %
Brady Statistic RA Percent Paced: 32.02 %
Brady Statistic RV Percent Paced: 1 %
Date Time Interrogation Session: 20240221075430
Implantable Lead Connection Status: 753985
Implantable Lead Connection Status: 753985
Implantable Lead Implant Date: 20200527
Implantable Lead Implant Date: 20200527
Implantable Lead Location: 753859
Implantable Lead Location: 753860
Implantable Lead Model: 5076
Implantable Lead Model: 5076
Implantable Pulse Generator Implant Date: 20200527
Lead Channel Impedance Value: 323 Ohm
Lead Channel Impedance Value: 380 Ohm
Lead Channel Impedance Value: 456 Ohm
Lead Channel Impedance Value: 627 Ohm
Lead Channel Pacing Threshold Amplitude: 0.5 V
Lead Channel Pacing Threshold Amplitude: 0.5 V
Lead Channel Pacing Threshold Pulse Width: 0.4 ms
Lead Channel Pacing Threshold Pulse Width: 0.4 ms
Lead Channel Sensing Intrinsic Amplitude: 4.25 mV
Lead Channel Sensing Intrinsic Amplitude: 4.25 mV
Lead Channel Sensing Intrinsic Amplitude: 9.875 mV
Lead Channel Sensing Intrinsic Amplitude: 9.875 mV
Lead Channel Setting Pacing Amplitude: 1.5 V
Lead Channel Setting Pacing Amplitude: 2.5 V
Lead Channel Setting Pacing Pulse Width: 0.4 ms
Lead Channel Setting Sensing Sensitivity: 2 mV
Zone Setting Status: 755011
Zone Setting Status: 755011

## 2022-07-26 MED ORDER — LIDOCAINE HCL 1 % IJ SOLN
6.0000 mL | Freq: Once | INTRAMUSCULAR | Status: AC
  Administered 2022-07-26: 6 mL

## 2022-07-26 NOTE — Patient Instructions (Signed)
-   Resume Usual Activities. Notify Physician of any unusual bleeding, erythema or concern for side effects as reviewed above. - Apply ice prn for pain - Tylenol prn for pain - Follow up 08/30/22 to assess response to injection

## 2022-07-26 NOTE — Progress Notes (Unsigned)
HPI: [name] Glass is a 82 y.o. male with PMHx has Coronary artery disease; OA (osteoarthritis) of knee; Gastroesophageal reflux disease without esophagitis; Essential hypertension, benign; Hyperlipidemia with target LDL less than 70; Chronic renal disease, stage 3, moderately decreased glomerular filtration rate (GFR) between 30-59 mL/min/1.73 square meter ([place]); Seasonal allergic rhinitis due to pollen; Benign prostatic hyperplasia without lower urinary tract symptoms; PSA elevation; Presence of permanent cardiac pacemaker; Sleep apnea; Hypothyroidism; Cervicalgia; Degenerative disc disease, cervical; Hypogonadism male; Moderate persistent asthma without complication; Chronic bronchitis, mucopurulent ([place]); Sinus node dysfunction ([place]); Thoracic aortic aneurysm ([place]); Moderate aortic stenosis; Chronic tension-type headache, not intractable; Postural kyphosis of cervicothoracic region; Cervico-occipital neuralgia of right side; and Prediabetes on their problem list. who presents to clinic for treatment of pain related to R occipital neuralgia and myofascial pain  via injection as described below.    No new concerns or complaints. No major changes in medical history since last visit.   Physical Exam:  General: Appropriate appearance for age.  Mental Status: Appropriate mood and affect.  Cardiovascular: RRR, no m/r/g.  Respiratory: CTAB, no rales/rhonchi/wheezing.  Skin: No apparent rashes or lesions.  Neuro: Awake, alert, and oriented x3. No apparent deficits.  MSK:  Moving all 4 limbs antigravity and against resistance.   PROCEDURE:  Right   greater occipital nerve block and trigger point injections Diagnosis: No diagnosis found.  Goals with treatment: [ x ] Decrease pain [ x ] Improve Active / Passive ROM [ x ] Improve ADLs [  ] Improve functional mobility  MEDICATION:  [ x ] Kenalog 40 mg/mL  [ X ] Lidocaine 1%    CONSENT: Obtained in writing per policy. Consent uploaded to  chart.  Benefits discussed.  Risks discussed included, but were not limited to, pain and discomfort, bleeding, bruising, allergic reaction, infection. All questions answered to patient/family member/guardian/ caregiver satisfaction. They would like to proceed with procedure. There are no noted contraindications to procedure.  PROCEDURE Time out was preformed No heat sources No antibiotics  The patient was explained about both the benefits and risks of a Right   occipital nerve block and TPI . After the patient acknowledged an understanding of the risks and benefits, the patient agreed to proceed. The area was first marked and then prepped in an aseptic fashion with betadine / alcohol. A 30 g, 1/2 inch needle was directed via a posterior approach into the Right   posterior occiput with 2cc medication mixture injected over the greater occipital nerve, then 0.5 cc medication per trigger point in the right cervical paraspinals, bilateral levator scapulae, and right trapezius muscles . The injection was completed with Kenalog 40 mg/ml 0.2 cc mixed with 6 cc of 1% lidocaine after no blood was aspirated on pull back.  No complications were encountered. The patient tolerated the procedure well.  Impression: HPI: [name] Glass is a 82 y.o. male with PMHx has Coronary artery disease; OA (osteoarthritis) of knee; Gastroesophageal reflux disease without esophagitis; Essential hypertension, benign; Hyperlipidemia with target LDL less than 70; Chronic renal disease, stage 3, moderately decreased glomerular filtration rate (GFR) between 30-59 mL/min/1.73 square meter ([place]); Seasonal allergic rhinitis due to pollen; Benign prostatic hyperplasia without lower urinary tract symptoms; PSA elevation; Presence of permanent cardiac pacemaker; Sleep apnea; Hypothyroidism; Cervicalgia; Degenerative disc disease, cervical; Hypogonadism male; Moderate persistent asthma without complication; Chronic bronchitis, mucopurulent  ([place]); Sinus node dysfunction ([place]); Thoracic aortic aneurysm ([place]); Moderate aortic stenosis; Chronic tension-type headache, not intractable; Postural kyphosis of cervicothoracic region;  Cervico-occipital neuralgia of right side; and Prediabetes on their problem list. who presents to clinic for treatment of R occipital neuralgia and myofascial pain . They received a  Right   occipital nerve block and TPI  as above.   PLAN: - Resume Usual Activities. Notify Physician of any unusual bleeding, erythema or concern for side effects as reviewed above. - Apply ice prn for pain - Tylenol prn for pain - Follow up 08/30/22 to assess response to injection   Patient/Care Giver was ready to learn without apparent learning barriers. Education was provided on diagnosis, treatment options/plan according to patient's preferred learning style. Patient/Care Giver verbalized understanding and agreement with the above plan.   Gertie Gowda, DO 07/26/2022

## 2022-08-03 ENCOUNTER — Other Ambulatory Visit: Payer: Self-pay | Admitting: Internal Medicine

## 2022-08-03 ENCOUNTER — Other Ambulatory Visit: Payer: Self-pay | Admitting: Physical Medicine and Rehabilitation

## 2022-08-03 DIAGNOSIS — N400 Enlarged prostate without lower urinary tract symptoms: Secondary | ICD-10-CM

## 2022-08-11 DIAGNOSIS — M25512 Pain in left shoulder: Secondary | ICD-10-CM | POA: Diagnosis not present

## 2022-08-22 NOTE — Progress Notes (Signed)
Remote pacemaker transmission.   

## 2022-08-23 DIAGNOSIS — M7542 Impingement syndrome of left shoulder: Secondary | ICD-10-CM | POA: Diagnosis not present

## 2022-08-23 DIAGNOSIS — S46012D Strain of muscle(s) and tendon(s) of the rotator cuff of left shoulder, subsequent encounter: Secondary | ICD-10-CM | POA: Diagnosis not present

## 2022-08-29 NOTE — Progress Notes (Signed)
Subjective:    Patient ID: [name] Glass, male    DOB: 25-Jan-1941, 82 y.o.   MRN: [id number]  HPI  F/u R occipital nerve block and TPI [name] Glass is a 82 y.o. 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   They are presenting to PM&R clinic as a follow up patient for treatment of neck pain and headaches .    Cervicalgia Assessment & Plan: Continue with PT for stretching and massage. Stop weekly dry needling if it is not benefiting you.   Take half to one full tab of Tizanidine at nighttime for excessive neck pain and spasm. Call clinic if any concerning side effects.    Follow up in 6 weeks     Muscle spasms of neck   Cervico-occipital neuralgia of right side Assessment & Plan: Follow up in 1-2 weeks for trigger point injections and occipital nerve injection the right.          Postural kyphosis of cervicothoracic region Assessment & Plan: Discussed improved ergonomics; he is now doing better with reading on eye level.    Discussed moving TV to eye level as well, instead of looking down.     Interval Hx:  - Therapies: Still doing Hep.    - Medications: R occipital nerve bock worked "pretty well"; he denies sharp pain but continues to have a dull pain with ROM exercise.   Has not taken the tizanidine; "I dont want to take that stuff unless I really, really have to."  Still does voltaren gel  Has tied tramadol in the past and didn't see a big difference in his pain control with that.    - Other concerns: Has noticed cramps in R shoulder are better with heat.    He thinks overall his pain is 70% improved; he still has about 30 percent of the pain and feels this is tolerable.   Pain Inventory Average Pain 3 Pain Right Now 3 My pain is constant and dull  In the last 24 hours, has pain interfered with the following? General activity 3 Relation with others 4 Enjoyment of life 4 What TIME of day is your pain at its worst? evening Sleep (in general) Fair  Pain is worse with: standing Pain improves with: heat/ice, TENS, and injections Relief from Meds: 1  Family History  Problem Relation Age of Onset   Heart attack Father 66       lived to 40   Alzheimer's disease Mother    Stroke Mother    Breast cancer Mother    Alzheimer's disease Maternal Grandmother    Stroke Maternal Grandmother    Lung cancer Maternal Grandmother    Lung cancer Maternal Grandfather    Cancer Paternal Grandmother        type unknown   Diabetes Paternal Grandfather    Other Paternal Grandfather        Growth in the back of the head   Social History   Socioeconomic History   Marital status: Married    Spouse name: Almyra Free   Number of children: 0   Years of education: Not on file   Highest education level: Not on file  Occupational History  Occupation: Retired   Tobacco Use   Smoking status: Never   Smokeless tobacco: Never  Vaping Use   Vaping Use: Never used  Substance and Sexual Activity   Alcohol use: Yes    Alcohol/week: 4.0 standard drinks of alcohol    Types: 4 Standard drinks or equivalent per week    Comment: 3 drinks at night    Drug use: No   Sexual activity: Not Currently  Other Topics Concern   Not on file  Social History Narrative   Not on file   Social Determinants of Health   Financial Resource Strain: Low Risk  (05/01/2022)   Overall Financial Resource Strain (CARDIA)    Difficulty of Paying Living Expenses: Not hard at all  Food Insecurity: No Food Insecurity (05/01/2022)   Hunger Vital Sign    Worried About Running Out  of Food in the Last Year: Never true    Ran Out of Food in the Last Year: Never true  Transportation Needs: No Transportation Needs (05/01/2022)   PRAPARE - Hydrologist (Medical): No    Lack of Transportation (Non-Medical): No  Physical Activity: Sufficiently Active (05/01/2022)   Exercise Vital Sign    Days of Exercise per Week: 5 days    Minutes of Exercise per Session: 30 min  Stress: No Stress Concern Present (05/01/2022)   [place]    Feeling of Stress : Not at all  Social Connections: Moderately Integrated (05/01/2022)   Social Connection and Isolation Panel [NHANES]    Frequency of Communication with Friends and Family: More than three times a week    Frequency of Social Gatherings with Friends and Family: More than three times a week    Attends Religious Services: Never    Marine scientist or Organizations: Yes    Attends Archivist Meetings: More than 4 times per year    Marital Status: Married   Past Surgical History:  Procedure Laterality Date   CARDIAC CATHETERIZATION Right 2008   5 heart stents   CATARACT EXTRACTION Bilateral 6 years ago   CYSTOSCOPY N/A 08/06/2012   Procedure: CYSTOSCOPY;  Surgeon: [name], MD;  Location: [facility];  Service: Urology;  Laterality: N/A;   ESOPHAGOGASTRIC FUNDOPLICATION  Q000111Q   spleen removed, 5 pints of blood given   EYE SURGERY     HERNIA REPAIR  1985   x4   INGUINAL HERNIA REPAIR  1992   PACEMAKER IMPLANT N/A 10/30/2018   Procedure: PACEMAKER IMPLANT;  Surgeon: Constance Haw, MD;  Location: [place] [facility];  Service: Cardiovascular;  Laterality: N/A;   PACEMAKER INSERTION     PPM GENERATOR REMOVAL N/A 10/25/2018   Procedure: PPM GENERATOR REMOVAL;  Surgeon: Constance Haw, MD;  Location: [place] [facility];  Service: Cardiovascular;  Laterality: N/A;   PROSTATE ABLATION  2013   "laser ablation"    RETINAL DETACHMENT SURGERY Right 01/24/2019   Pneumatic cryopexy - Dr. Bernarda Caffey   TEE WITHOUT CARDIOVERSION N/A 10/25/2018   Procedure: TRANSESOPHAGEAL ECHOCARDIOGRAM (TEE);  Surgeon: Acie Fredrickson Wonda Cheng, MD;  Location: [facility];  Service: Cardiovascular;  Laterality: N/A;   TEMPORARY PACEMAKER N/A 10/25/2018   Procedure: TEMPORARY PACEMAKER;  Surgeon: Constance Haw, MD;  Location: [place] [facility];  Service: Cardiovascular;  Laterality: N/A;   TONSILLECTOMY  as child   TOTAL KNEE ARTHROPLASTY Right 08/03/2014   Procedure: TOTAL RIGHT KNEE ARTHROPLASTY;  Surgeon: [name], MD;  Location: [facility];  Service: Orthopedics;  Laterality: Right;   TRANSURETHRAL RESECTION OF PROSTATE N/A 08/06/2012   Procedure: Almyra Free OF PROSTATE WITH GYRUS ;  Surgeon: [name], MD;  Location: [facility];  Service: Urology;  Laterality: N/A;   UMBILICAL HERNIA REPAIR  1990   UVULECTOMY  2005   for sleep apnea   UVULOPALATOPHARYNGOPLASTY  6 years ago   Past Surgical History:  Procedure Laterality Date   CARDIAC CATHETERIZATION Right 2008   5 heart stents   CATARACT EXTRACTION Bilateral 6 years ago   CYSTOSCOPY N/A 08/06/2012   Procedure: CYSTOSCOPY;  Surgeon: [name], MD;  Location: [facility];  Service: Urology;  Laterality: N/A;   ESOPHAGOGASTRIC FUNDOPLICATION  Q000111Q   spleen removed, 5 pints of blood given   EYE SURGERY     HERNIA REPAIR  1985   x4   INGUINAL HERNIA REPAIR  1992   PACEMAKER IMPLANT N/A 10/30/2018   Procedure: PACEMAKER IMPLANT;  Surgeon: Constance Haw, MD;  Location: [place] [facility];  Service: Cardiovascular;  Laterality: N/A;   PACEMAKER INSERTION     PPM GENERATOR REMOVAL N/A 10/25/2018   Procedure: PPM GENERATOR REMOVAL;  Surgeon: Constance Haw, MD;  Location: [place] [facility];  Service: Cardiovascular;  Laterality: N/A;   PROSTATE ABLATION  2013   "laser ablation"   RETINAL DETACHMENT SURGERY Right 01/24/2019   Pneumatic cryopexy - Dr. Bernarda Caffey   TEE WITHOUT CARDIOVERSION N/A 10/25/2018   Procedure: TRANSESOPHAGEAL ECHOCARDIOGRAM (TEE);  Surgeon: Acie Fredrickson Wonda Cheng, MD;  Location: [facility];  Service: Cardiovascular;  Laterality: N/A;   TEMPORARY PACEMAKER N/A 10/25/2018   Procedure: TEMPORARY PACEMAKER;  Surgeon: Constance Haw, MD;  Location: [place] [facility];  Service: Cardiovascular;  Laterality: N/A;   TONSILLECTOMY  as child   TOTAL KNEE ARTHROPLASTY Right 08/03/2014   Procedure: TOTAL RIGHT KNEE ARTHROPLASTY;  Surgeon: [name], MD;  Location: [facility];  Service: Orthopedics;  Laterality: Right;   TRANSURETHRAL RESECTION OF PROSTATE N/A 08/06/2012   Procedure: Almyra Free OF PROSTATE WITH GYRUS ;  Surgeon: [name], MD;  Location: [facility];  Service: Urology;  Laterality: N/A;   UMBILICAL HERNIA REPAIR  1990   UVULECTOMY  2005   for sleep apnea   UVULOPALATOPHARYNGOPLASTY  6 years ago   Past Medical History:  Diagnosis Date   Arthritis    Basal cell carcinoma 02/17/2020   infl & ulcerated-Left forehead (MOHS)   Coronary artery disease    GERD (gastroesophageal reflux disease)    H/O bronchitis    H/O hiatal hernia    History of kidney stones last in 1976   Hyperlipidemia    statin intolerant, under control   Hypertension    under control   Hypertensive retinopathy    OU   Hypothyroidism    Moderate persistent asthma without complication [id number]   Retinal detachment    OD   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   left helix tx cx3 101fu   Sleep apnea    hx of, surgery to reverse   There were no vitals taken for this visit.  Opioid Risk Score:   Fall Risk Score:  `1  Depression screen [facility] 2/9     07/26/2022   12:53 PM 07/12/2022   12:52 PM 05/01/2022    2:12 PM 04/25/2021    9:56 AM 04/25/2021    9:53 AM 02/10/2020    2:40  PM 12/22/2019   11:29 AM  Depression screen PHQ 2/9  Decreased Interest 0 0 0 0 0 0 0  Down, Depressed, Hopeless 0 0 0 0 0 0 0  PHQ - 2 Score 0 0 0 0  0 0 0    Review of Systems  Musculoskeletal:  Positive for neck pain.      Objective:   Physical Exam   PE: Constitution: Appropriate appearance for age. No apparent distress   Resp: No respiratory distress. No accessory muscle usage. on RA Cardio: Well perfused appearance. No peripheral edema. Abdomen: Nondistended. Nontender.   Psych: Appropriate mood and affect. Neuro: AAOx4. No apparent cognitive deficits   Neurologic Exam:   Sensory exam: revealed normal sensation in all dermatomal regions in bilateral upper extremities and bilateral lower extremities Motor exam: strength 5/5 throughout bilateral upper extremities and bilateral lower extremities Coordination: Fine motor coordination was normal.   Gait: normal  Neck: AROM improved in extension, bilateral rotation and bilateral sidebending. Exaggerated thoracic kyphososis, unchanged.  No further shooting pain with headache symptoms reproduced with palpation of R greater occipital nerve, mild on R lesser, none on L         Assessment & Plan:   [name] Glass is a 82 y.o. 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   They are presenting to PM&R clinic as a follow up for neck pain and headaches; 70% improved since initial evaluation and tolerable per patient, not interfering with daily activities.   Myofascial neck pain Assessment & Plan: Take tizanidine as needed for muscle spasms in your back, neck, and shoulder.   Cervico-occipital neuralgia of right side Assessment & Plan: Follow up with me every 1-2 months for  trigger points and R occipital nerve blocks    Postural kyphosis of cervicothoracic region Assessment & Plan: Continue HEP and optimizing positioning for good reading, writing, and TV ergonomics

## 2022-08-30 ENCOUNTER — Encounter: Payer: Self-pay | Admitting: Physical Medicine and Rehabilitation

## 2022-08-30 ENCOUNTER — Encounter
Payer: Medicare Other | Attending: Physical Medicine and Rehabilitation | Admitting: Physical Medicine and Rehabilitation

## 2022-08-30 VITALS — BP 132/72 | HR 66 | Ht 72.0 in | Wt 191.0 lb

## 2022-08-30 DIAGNOSIS — M4003 Postural kyphosis, cervicothoracic region: Secondary | ICD-10-CM | POA: Insufficient documentation

## 2022-08-30 DIAGNOSIS — M5481 Occipital neuralgia: Secondary | ICD-10-CM | POA: Insufficient documentation

## 2022-08-30 DIAGNOSIS — M542 Cervicalgia: Secondary | ICD-10-CM | POA: Insufficient documentation

## 2022-08-30 NOTE — Patient Instructions (Signed)
Follow up with me every 1-2 months for trigger points and R occipital nerve blocks Take tizanidine as needed for muscle spasms in your back, neck, and shoulder.

## 2022-09-04 ENCOUNTER — Ambulatory Visit: Payer: Medicare Other | Attending: Internal Medicine | Admitting: Internal Medicine

## 2022-09-04 ENCOUNTER — Encounter: Payer: Self-pay | Admitting: Internal Medicine

## 2022-09-04 ENCOUNTER — Other Ambulatory Visit (HOSPITAL_COMMUNITY): Payer: Self-pay | Admitting: Orthopedic Surgery

## 2022-09-04 VITALS — BP 118/74 | HR 79 | Ht 72.0 in | Wt 189.0 lb

## 2022-09-04 DIAGNOSIS — M25512 Pain in left shoulder: Secondary | ICD-10-CM | POA: Diagnosis not present

## 2022-09-04 DIAGNOSIS — I495 Sick sinus syndrome: Secondary | ICD-10-CM | POA: Diagnosis not present

## 2022-09-04 DIAGNOSIS — M25511 Pain in right shoulder: Secondary | ICD-10-CM | POA: Diagnosis not present

## 2022-09-04 DIAGNOSIS — Z950 Presence of cardiac pacemaker: Secondary | ICD-10-CM | POA: Insufficient documentation

## 2022-09-04 DIAGNOSIS — M75100 Unspecified rotator cuff tear or rupture of unspecified shoulder, not specified as traumatic: Secondary | ICD-10-CM

## 2022-09-04 LAB — CUP PACEART INCLINIC DEVICE CHECK
Battery Remaining Longevity: 130 mo
Battery Voltage: 3.02 V
Brady Statistic AP VP Percent: 0.7 %
Brady Statistic AP VS Percent: 38.56 %
Brady Statistic AS VP Percent: 0.4 %
Brady Statistic AS VS Percent: 60.35 %
Brady Statistic RA Percent Paced: 39.14 %
Brady Statistic RV Percent Paced: 1.1 %
Date Time Interrogation Session: 20240401172328
Implantable Lead Connection Status: 753985
Implantable Lead Connection Status: 753985
Implantable Lead Implant Date: 20200527
Implantable Lead Implant Date: 20200527
Implantable Lead Location: 753859
Implantable Lead Location: 753860
Implantable Lead Model: 5076
Implantable Lead Model: 5076
Implantable Pulse Generator Implant Date: 20200527
Lead Channel Impedance Value: 399 Ohm
Lead Channel Impedance Value: 437 Ohm
Lead Channel Impedance Value: 513 Ohm
Lead Channel Impedance Value: 665 Ohm
Lead Channel Pacing Threshold Amplitude: 0.5 V
Lead Channel Pacing Threshold Amplitude: 0.75 V
Lead Channel Pacing Threshold Pulse Width: 0.4 ms
Lead Channel Pacing Threshold Pulse Width: 0.4 ms
Lead Channel Sensing Intrinsic Amplitude: 10 mV
Lead Channel Sensing Intrinsic Amplitude: 11.375 mV
Lead Channel Sensing Intrinsic Amplitude: 4.125 mV
Lead Channel Sensing Intrinsic Amplitude: 4.375 mV
Lead Channel Setting Pacing Amplitude: 1.5 V
Lead Channel Setting Pacing Amplitude: 2.5 V
Lead Channel Setting Pacing Pulse Width: 0.4 ms
Lead Channel Setting Sensing Sensitivity: 2 mV
Zone Setting Status: 755011
Zone Setting Status: 755011

## 2022-09-04 NOTE — Patient Instructions (Signed)
Medication Instructions:  Your physician recommends that you continue on your current medications as directed. Please refer to the Current Medication list given to you today.  *If you need a refill on your cardiac medications before your next appointment, please call your pharmacy*   Lab Work: None ordered.  If you have labs (blood work) drawn today and your tests are completely normal, you will receive your results only by: [place] (if you have MyChart) OR A paper copy in the mail If you have any lab test that is abnormal or we need to change your treatment, we will call you to review the results.   Testing/Procedures: None ordered.    Follow-Up: At [facility], you and your health needs are our priority.  As part of our continuing mission to provide you with exceptional heart care, we have created designated Provider Care Teams.  These Care Teams include your primary Cardiologist (physician) and Advanced Practice Providers (APPs -  Physician Assistants and Nurse Practitioners) who all work together to provide you with the care you need, when you need it.  We recommend signing up for the patient portal called "MyChart".  Sign up information is provided on this After Visit Summary.  MyChart is used to connect with patients for Virtual Visits (Telemedicine).  Patients are able to view lab/test results, encounter notes, upcoming appointments, etc.  Non-urgent messages can be sent to your provider as well.   To learn more about what you can do with MyChart, go to [email or web address].    Your next appointment:   12 months with Dr Olin Pia PA

## 2022-09-04 NOTE — Progress Notes (Signed)
Patient Care Team: [name], MD as PCP - General (Internal Medicine) [name], MD as PCP - Cardiology (Cardiology) Lavonna Monarch, MD (Inactive) as Consulting Physician (Dermatology)   HPI  [name] Glass is a 82 y.o. male Seen folloWUp for pacer-Medtronic implanted in [place] 1/20 for sinus node dysfunction.  He has a history of syncope.  Presented 5/20 with fever and sepsis.  He had strep bacteremia.  He underwent pacemaker removal PICC line placement and pacemaker generator reimplantation  History of coronary artery disease with multiple prior stents. During syncopal episode in [place], he underwent atherectomy of the LAD and DES stenting.  The patient denies chest pain, nocturnal dyspnea, orthopnea or peripheral edema.  There have been no palpitations, lightheadedness or syncope.  Complains of mild shortness of breath with significant exertion but largely unlimited.     Date Cr K TSH Hgb LDL   7/21 1.11 4.5 0.07 (dose decreased) 16.3   1/24  1.28 4.01 0.71 16.0 75    DATE TEST EF   3/19 Myoview 50% No perfusion Defects  5/20 Echo  50-55 %   5-20 TEE  MR mod   5/23 Echo  65-70% MR mild      Records and Results Reviewed   Past Medical History:  Diagnosis Date   Arthritis    Basal cell carcinoma 02/17/2020   infl & ulcerated-Left forehead (MOHS)   Coronary artery disease    GERD (gastroesophageal reflux disease)    H/O bronchitis    H/O hiatal hernia    History of kidney stones last in 1976   Hyperlipidemia    statin intolerant, under control   Hypertension    under control   Hypertensive retinopathy    OU   Hypothyroidism    Moderate persistent asthma without complication [id number]   Retinal detachment    OD   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   left helix tx cx3 68fu   Sleep apnea    hx of, surgery to reverse    Past Surgical History:  Procedure Laterality Date   CARDIAC CATHETERIZATION Right 2008   5 heart  stents   CATARACT EXTRACTION Bilateral 6 years ago   CYSTOSCOPY N/A 08/06/2012   Procedure: CYSTOSCOPY;  Surgeon: [name], MD;  Location: [facility];  Service: Urology;  Laterality: N/A;   ESOPHAGOGASTRIC FUNDOPLICATION  Q000111Q   spleen removed, 5 pints of blood given   EYE SURGERY     HERNIA REPAIR  1985   x4   INGUINAL HERNIA REPAIR  1992   PACEMAKER IMPLANT N/A 10/30/2018   Procedure: PACEMAKER IMPLANT;  Surgeon: Constance Haw, MD;  Location: [place] [facility];  Service: Cardiovascular;  Laterality: N/A;   PACEMAKER INSERTION     PPM GENERATOR REMOVAL N/A 10/25/2018   Procedure: PPM GENERATOR REMOVAL;  Surgeon: Constance Haw, MD;  Location: [place] [facility];  Service: Cardiovascular;  Laterality: N/A;   PROSTATE ABLATION  2013   "laser ablation"   RETINAL DETACHMENT SURGERY Right 01/24/2019   Pneumatic cryopexy - Dr. Bernarda Caffey   TEE WITHOUT CARDIOVERSION N/A 10/25/2018   Procedure: TRANSESOPHAGEAL ECHOCARDIOGRAM (TEE);  Surgeon: Acie Fredrickson Wonda Cheng, MD;  Location: [facility] END[facility]COPY;  Service: Cardiovascular;  Laterality: N/A;   TEMPORARY PACEMAKER N/A 10/25/2018   Procedure: TEMPORARY PACEMAKER;  Surgeon: Constance Haw, MD;  Location: [place] [facility];  Service: Cardiovascular;  Laterality: N/A;   TONSILLECTOMY  as child  TOTAL KNEE ARTHROPLASTY Right 08/03/2014   Procedure: TOTAL RIGHT KNEE ARTHROPLASTY;  Surgeon: [name], MD;  Location: [facility];  Service: Orthopedics;  Laterality: Right;   TRANSURETHRAL RESECTION OF PROSTATE N/A 08/06/2012   Procedure: Almyra Free OF PROSTATE WITH GYRUS ;  Surgeon: [name], MD;  Location: [facility];  Service: Urology;  Laterality: N/A;   UMBILICAL HERNIA REPAIR  1990   UVULECTOMY  2005   for sleep apnea   UVULOPALATOPHARYNGOPLASTY  6 years ago    Current Meds  Medication Sig   acetaminophen (TYLENOL) 650 MG CR tablet Take by mouth.   acyclovir (ZOVIRAX) 200 MG capsule TAKE 1 CAPSULE BY MOUTH THREE TIMES A DAY    anastrozole (ARIMIDEX) 1 MG tablet Take by mouth.   aspirin EC 81 MG tablet Take by mouth.   Cholecalciferol (VITAMIN D-3) 125 MCG (5000 UT) TABS Take 125 mcg by mouth daily.   diclofenac Sodium (VOLTAREN) 1 % GEL Apply 2 g topically 4 (four) times daily.   diphenhydramine-acetaminophen (TYLENOL PM) 25-500 MG TABS tablet Take 2 tablets by mouth every evening.   ezetimibe (ZETIA) 10 MG tablet TAKE 1 TABLET BY MOUTH EVERY DAY   finasteride (PROSCAR) 5 MG tablet Take 1 tablet by mouth daily.   fluticasone (FLONASE) 50 MCG/ACT nasal spray Place into the nose.   hydrALAZINE (APRESOLINE) 25 MG tablet Take by mouth.   ipratropium (ATROVENT) 0.03 % nasal spray Place 2 sprays into both nostrils 3 (three) times daily as needed for rhinitis.   levocetirizine (XYZAL) 5 MG tablet TAKE 1 TABLET BY MOUTH EVERY DAY IN THE EVENING   liothyronine (CYTOMEL) 25 MCG tablet Take by mouth.   montelukast (SINGULAIR) 10 MG tablet Take 1 tablet by mouth at bedtime.   Nandrolone Decanoate 200 MG/ML OIL Inject 100 mg as directed once a week.   Omega-3 Fatty Acids (FISH OIL) 1200 MG CAPS Take by mouth.   omeprazole (PRILOSEC) 20 MG capsule Take by mouth.   Pitavastatin Calcium 4 MG TABS TAKE 1 TABLET BY MOUTH EVERY DAY   potassium gluconate 595 (99 K) MG TABS tablet Take by mouth.   Probiotic Product (PRODIGEN) CAPS Take 1 capsule by mouth daily.   Simethicone 40 MG/0.6ML LIQD Take 0.6 mLs (40 mg total) by mouth 4 (four) times daily.   Somatropin (OMNITROPE) 5 MG/1.5ML SOCT Inject into the skin.   Somatropin (OMNITROPE) 5.8 MG SOLR ADD 1.14 ML DILUENT PER VIAL AND INJECT 0.15ML SUBCUTANEOUSLY MONDAY THROUGH FRIDAY AT BEDTIME. REFRIGERATE DO NOT FREEZE.   SYRINGE-NEEDLE, DISP, 3 ML 23G X 1" 3 ML MISC Use to inject testosterone as prescribed.   tamsulosin (FLOMAX) 0.4 MG CAPS capsule TAKE 1 CAPSULE BY MOUTH EVERY DAY IN THE MORNING   telmisartan (MICARDIS) 80 MG tablet    testosterone cypionate (DEPOTESTOSTERONE  CYPIONATE) 200 MG/ML injection INJECT 1.25 MLS (250 MG TOTAL) INTO THE MUSCLE EVERY 7 (SEVEN) DAYS.   thyroid (ARMOUR THYROID) 60 MG tablet Take 1 tablet (60 mg total) by mouth daily before breakfast.   tiZANidine (ZANAFLEX) 4 MG tablet TAKE 0.5-1 TABLET AT BEDTIME AS NEEDED FOR MUSCLE SPASMS (FOR NECK PAIN AND TIGHTNESS).   umeclidinium-vilanterol (ANORO ELLIPTA) 62.5-25 MCG/ACT AEPB Inhale into the lungs.    Allergies  Allergen Reactions   Morphine And Related     Had "crazy dreams" felt "crazy" per pt.   Bactrim [Sulfamethoxazole-Trimethoprim]    Morphine     Other Reaction(s): Not available   Statins    Sulfamethoxazole  Trimethoprim       Review of Systems negative except from HPI and PMH  Physical Exam BP 118/74   Pulse 79   Ht 6' (1.829 m)   Wt 189 lb (85.7 kg)   SpO2 93%   BMI 25.63 kg/m  Well developed and well nourished in no acute distress HENT normal Neck supple with JVP-flat Clear Device pocket well healed; without hematoma or erythema.  There is no tethering  Regular rate and rhythm, no  gallop No  murmur Abd-soft with active BS No Clubbing cyanosis  edema Skin-warm and dry A & Oriented  Grossly normal sensory and motor function  ECG sinus at 79 Intervals 26/17/44 Axis left -42  Device function is normal. Programming changes NONE  See Paceart for details      Assessment and  Plan  Sinus node dysfunction  Trifascicular block   Syncope   Ischemic heart disease with prior stenting  Dyspnea on exertion-acute/chronic?  HFpEF  Mitral regurgitation interval improvement    Pacemaker-Medtronic  Explant and reimplant following strep infection 5/20     No interval syncope.  Chronotropic competence seems reasonable.  No angina.  Continue his atorvastatin and Zetia.  And aspirin.       Current medicines are reviewed at length with the patient today .  The patient does not  have concerns regarding medicines.

## 2022-09-06 NOTE — Assessment & Plan Note (Signed)
Continue HEP and optimizing positioning for good reading, writing, and TV ergonomics

## 2022-09-06 NOTE — Assessment & Plan Note (Signed)
Take tizanidine as needed for muscle spasms in your back, neck, and shoulder.

## 2022-09-06 NOTE — Assessment & Plan Note (Signed)
Follow up with me every 1-2 months for trigger points and R occipital nerve blocks

## 2022-09-11 ENCOUNTER — Other Ambulatory Visit: Payer: Self-pay | Admitting: Internal Medicine

## 2022-09-20 ENCOUNTER — Encounter
Payer: Medicare Other | Attending: Physical Medicine and Rehabilitation | Admitting: Physical Medicine and Rehabilitation

## 2022-09-20 VITALS — BP 131/73 | HR 92 | Ht 72.0 in | Wt 190.0 lb

## 2022-09-20 DIAGNOSIS — M542 Cervicalgia: Secondary | ICD-10-CM | POA: Diagnosis not present

## 2022-09-24 MED ORDER — TRIAMCINOLONE ACETONIDE 40 MG/ML IJ SUSP
0.4000 mg | Freq: Once | INTRAMUSCULAR | Status: DC
Start: 2022-09-24 — End: 2022-12-18

## 2022-09-24 MED ORDER — LIDOCAINE HCL 1 % IJ SOLN
3.0000 mL | Freq: Once | INTRAMUSCULAR | Status: DC
Start: 2022-09-24 — End: 2022-12-18

## 2022-09-24 NOTE — Progress Notes (Signed)
HPI: [name] Glass is a 82 y.o. male with PMHx has Coronary artery disease; OA (osteoarthritis) of knee; Gastroesophageal reflux disease without esophagitis; Essential hypertension, benign; Hyperlipidemia with target LDL less than 70; Chronic renal disease, stage 3, moderately decreased glomerular filtration rate (GFR) between 30-59 mL/min/1.73 square meter; Seasonal allergic rhinitis due to pollen; Benign prostatic hyperplasia without lower urinary tract symptoms; PSA elevation; Presence of permanent cardiac pacemaker; Sleep apnea; Hypothyroidism; Myofascial neck pain; Degenerative disc disease, cervical; Hypogonadism male; Moderate persistent asthma without complication; Chronic bronchitis, mucopurulent; Sinus node dysfunction; Thoracic aortic aneurysm; Moderate aortic stenosis; Chronic tension-type headache, not intractable; Postural kyphosis of cervicothoracic region; Cervico-occipital neuralgia of right side; and Prediabetes on their problem list. who presents to clinic for treatment of pain related to myofascial neck pain  via injection as described below.    No new concerns or complaints. No major changes in medical history since last visit.   Physical Exam:  General: Appropriate appearance for age.  Mental Status: Appropriate mood and affect.  Cardiovascular: RRR, no m/r/g.  Respiratory: CTAB, no rales/rhonchi/wheezing.  Skin: No apparent rashes or lesions.  Neuro: Awake, alert, and oriented x3. No apparent deficits.  MSK: Moving all 4 limbs antigravity and against resistance.   PROCEDURE:  Right trigger point injections Diagnosis:    ICD-10-CM   1. Myofascial neck pain  M54.2       Goals with treatment: [ x ] Decrease pain [ x ] Improve Active / Passive ROM [ x ] Improve ADLs [  ] Improve functional mobility  MEDICATION:  [ x ] Kenalog 40 mg/mL  [ X ] Lidocaine 1%    CONSENT: Obtained in writing per policy. Consent uploaded to chart.  Benefits discussed.  Risks discussed  included, but were not limited to, pain and discomfort, bleeding, bruising, allergic reaction, infection. All questions answered to patient/family member/guardian/ caregiver satisfaction. They would like to proceed with procedure. There are no noted contraindications to procedure.  PROCEDURE Time out was preformed No heat sources No antibiotics  The patient was explained about both the benefits and risks of a Right  trigger point injections. After the patient acknowledged an understanding of the risks and benefits, the patient agreed to proceed. The area was first marked and then prepped in an aseptic fashion with betadine / alcohol. A 30 g, 1/2 inch needle was directed via a direct approach into the Right  trigger point injections. The injection was completed with Kenalog 40 mg/ml 0.2 cc mixed with 6 cc of 1% lidocaine after no blood was aspirated on pull back.  No complications were encountered. The patient tolerated the procedure well.  Impression: HPI: [name] Glass is a 82 y.o. male with PMHx has Coronary artery disease; OA (osteoarthritis) of knee; Gastroesophageal reflux disease without esophagitis; Essential hypertension, benign; Hyperlipidemia with target LDL less than 70; Chronic renal disease, stage 3, moderately decreased glomerular filtration rate (GFR) between 30-59 mL/min/1.73 square meter; Seasonal allergic rhinitis due to pollen; Benign prostatic hyperplasia without lower urinary tract symptoms; PSA elevation; Presence of permanent cardiac pacemaker; Sleep apnea; Hypothyroidism; Myofascial neck pain; Degenerative disc disease, cervical; Hypogonadism male; Moderate persistent asthma without complication; Chronic bronchitis, mucopurulent; Sinus node dysfunction; Thoracic aortic aneurysm; Moderate aortic stenosis; Chronic tension-type headache, not intractable; Postural kyphosis of cervicothoracic region; Cervico-occipital neuralgia of right side; and Prediabetes on their problem list. who  presents to clinic for treatment of myofascial pain . They received a  Right  trigger point injections as above.   PLAN: -  Resume Usual Activities. Notify Physician of any unusual bleeding, erythema or concern for side effects as reviewed above. - Apply ice prn for pain - Tylenol prn for pain - Follow up monthly for repeat injections if benefiting your pain  Patient/Care Giver was ready to learn without apparent learning barriers. Education was provided on diagnosis, treatment options/plan according to patient's preferred learning style. Patient/Care Giver verbalized understanding and agreement with the above plan.   Angelina Sheriff, DO 09/24/2022

## 2022-10-13 ENCOUNTER — Telehealth: Payer: Self-pay | Admitting: Internal Medicine

## 2022-10-13 DIAGNOSIS — K219 Gastro-esophageal reflux disease without esophagitis: Secondary | ICD-10-CM

## 2022-10-13 MED ORDER — SIMETHICONE 40 MG/0.6ML PO LIQD
40.0000 mg | Freq: Four times a day (QID) | ORAL | 0 refills | Status: AC
Start: 2022-10-13 — End: ?

## 2022-10-13 NOTE — Telephone Encounter (Signed)
Prescription Request  10/13/2022  LOV: 07/05/2022  What is the name of the medication or equipment? omnitrope  Have you contacted your pharmacy to request a refill? Yes   Which pharmacy would you like this sent to?  [facility] - [facility], [place] - [address] [place] [address] Phone: [phone]  Fax: [phone]    Patient notified that their request is being sent to the clinical staff for review and that they should receive a response within 2 business days.   Please advise at Mobile [phone] (mobile)

## 2022-10-18 ENCOUNTER — Encounter
Payer: Medicare Other | Attending: Physical Medicine and Rehabilitation | Admitting: Physical Medicine and Rehabilitation

## 2022-10-18 ENCOUNTER — Encounter: Payer: Self-pay | Admitting: Physical Medicine and Rehabilitation

## 2022-10-18 VITALS — BP 118/71 | HR 88 | Ht 72.0 in | Wt 189.0 lb

## 2022-10-18 DIAGNOSIS — M5481 Occipital neuralgia: Secondary | ICD-10-CM | POA: Diagnosis not present

## 2022-10-18 DIAGNOSIS — M542 Cervicalgia: Secondary | ICD-10-CM | POA: Diagnosis not present

## 2022-10-18 MED ORDER — TRIAMCINOLONE ACETONIDE 40 MG/ML IJ SUSP
0.8000 mg | Freq: Once | INTRAMUSCULAR | Status: AC
Start: 2022-10-18 — End: 2022-10-18
  Administered 2022-10-18: 0.8 mg

## 2022-10-18 MED ORDER — LIDOCAINE HCL 1 % IJ SOLN
6.0000 mL | Freq: Once | INTRAMUSCULAR | Status: AC
Start: 2022-10-18 — End: 2022-10-18
  Administered 2022-10-18: 6 mL

## 2022-10-18 NOTE — Progress Notes (Signed)
PI: [name] Glass is a 82 y.o. male with PMHx has Coronary artery disease; OA (osteoarthritis) of knee; Gastroesophageal reflux disease without esophagitis; Essential hypertension, benign; Hyperlipidemia with target LDL less than 70; Chronic renal disease, stage 3, moderately decreased glomerular filtration rate (GFR) between 30-59 mL/min/1.73 square meter (HCC); Seasonal allergic rhinitis due to pollen; Benign prostatic hyperplasia without lower urinary tract symptoms; PSA elevation; Presence of permanent cardiac pacemaker; Sleep apnea; Hypothyroidism; Cervicalgia; Degenerative disc disease, cervical; Hypogonadism male; Moderate persistent asthma without complication; Chronic bronchitis, mucopurulent (HCC); Sinus node dysfunction (HCC); Thoracic aortic aneurysm (HCC); Moderate aortic stenosis; Chronic tension-type headache, not intractable; Postural kyphosis of cervicothoracic region; Cervico-occipital neuralgia of right side; and Prediabetes on their problem list. who presents to clinic for treatment of pain related to R occipital neuralgia and myofascial pain  via injection as described below.     No new concerns or complaints. No major changes in medical history since last visit.    Physical Exam:  General: Appropriate appearance for age.  Mental Status: Appropriate mood and affect.  Cardiovascular: RRR, no m/r/g.  Respiratory: CTAB, no rales/rhonchi/wheezing.  Skin: No apparent rashes or lesions.  Neuro: Awake, alert, and oriented x3. No apparent deficits.  MSK:  Moving all 4 limbs antigravity and against resistance.  + TTP R occiput with radiation into upper head; + TTP R cervical paraspinals, trap, and b/l levator scapulae   PROCEDURE:  Right   greater occipital nerve block and trigger point injections Diagnosis:      ICD-10-CM    1. Myofascial neck pain  M54.2 lidocaine (XYLOCAINE) 1 % (with pres) injection 6 mL     2. Cervico-occipital neuralgia of right side  M54.81 lidocaine  (XYLOCAINE) 1 % (with pres) injection 6 mL       Goals with treatment: [ x ] Decrease pain [ x ] Improve Active / Passive ROM [ x ] Improve ADLs [  ] Improve functional mobility   MEDICATION:  [ x ] Kenalog 40 mg/mL  [ X ] Lidocaine 1%      CONSENT: Obtained in writing per policy. Consent uploaded to chart.   Benefits discussed.  Risks discussed included, but were not limited to, pain and discomfort, bleeding, bruising, allergic reaction, infection. All questions answered to patient/family member/guardian/ caregiver satisfaction. They would like to proceed with procedure. There are no noted contraindications to procedure.   PROCEDURE Time out was preformed No heat sources No antibiotics   The patient was explained about both the benefits and risks of a Right   occipital nerve block and TPI . After the patient acknowledged an understanding of the risks and benefits, the patient agreed to proceed. The area was first marked and then prepped in an aseptic fashion with betadine / alcohol. A 30 g, 1/2 inch needle was directed via a posterior approach into the Right   posterior occiput with 2cc medication mixture injected over the greater occipital nerve, then 0.5 cc medication per trigger point in the right cervical paraspinals, bilateral levator scapulae, and right trapezius muscles . The injection was completed with Kenalog 40 mg/ml 0.2 cc mixed with 6 cc of 1% lidocaine after no blood was aspirated on pull back.   No complications were encountered. The patient tolerated the procedure well.   Impression: HPI: [name] Glass is a 82 y.o. male with PMHx has Coronary artery disease; OA (osteoarthritis) of knee; Gastroesophageal reflux disease without esophagitis; Essential hypertension, benign; Hyperlipidemia with target LDL less than 70; Chronic renal  disease, stage 3, moderately decreased glomerular filtration rate (GFR) between 30-59 mL/min/1.73 square meter (HCC); Seasonal allergic rhinitis  due to pollen; Benign prostatic hyperplasia without lower urinary tract symptoms; PSA elevation; Presence of permanent cardiac pacemaker; Sleep apnea; Hypothyroidism; Cervicalgia; Degenerative disc disease, cervical; Hypogonadism male; Moderate persistent asthma without complication; Chronic bronchitis, mucopurulent (HCC); Sinus node dysfunction (HCC); Thoracic aortic aneurysm (HCC); Moderate aortic stenosis; Chronic tension-type headache, not intractable; Postural kyphosis of cervicothoracic region; Cervico-occipital neuralgia of right side; and Prediabetes on their problem list. who presents to clinic for treatment of R occipital neuralgia and myofascial pain . They received a  Right  occipital nerve block and TPI  as above.    PLAN: - Resume Usual Activities. Notify Physician of any unusual bleeding, erythema or concern for side effects as reviewed above. - Apply ice prn for pain - Tylenol prn for pain - Follow up Q1M for repeat injections   Patient/Care Giver was ready to learn without apparent learning barriers. Education was provided on diagnosis, treatment options/plan according to patient's preferred learning style. Patient/Care Giver verbalized understanding and agreement with the above plan.     Angelina Sheriff, DO 07/26/2022

## 2022-10-19 ENCOUNTER — Telehealth: Payer: Self-pay | Admitting: Internal Medicine

## 2022-10-19 ENCOUNTER — Other Ambulatory Visit: Payer: Self-pay | Admitting: Internal Medicine

## 2022-10-19 DIAGNOSIS — E291 Testicular hypofunction: Secondary | ICD-10-CM

## 2022-10-19 MED ORDER — OMNITROPE 5.8 MG ~~LOC~~ SOLR
SUBCUTANEOUS | 0 refills | Status: DC
Start: 2022-10-19 — End: 2022-12-18

## 2022-10-19 NOTE — Telephone Encounter (Signed)
Prescription Request  10/19/2022  LOV: 07/05/2022  What is the name of the medication or equipment? omniptope  Have you contacted your pharmacy to request a refill? Yes   Which pharmacy would you like this sent to?   [facility] - [facility], [place] - [address] [place] [address] Phone: [phone] Fax: [phone]    Patient notified that their request is being sent to the clinical staff for review and that they should receive a response within 2 business days.   Please advise at Mobile [phone] (mobile)

## 2022-10-20 ENCOUNTER — Ambulatory Visit (HOSPITAL_COMMUNITY)
Admission: RE | Admit: 2022-10-20 | Discharge: 2022-10-20 | Disposition: A | Discharge status: Home / Self Care | Payer: Medicare Other | Source: Ambulatory Visit | Attending: Family Medicine | Admitting: Family Medicine

## 2022-10-20 DIAGNOSIS — M75100 Unspecified rotator cuff tear or rupture of unspecified shoulder, not specified as traumatic: Secondary | ICD-10-CM

## 2022-10-20 DIAGNOSIS — M67812 Other specified disorders of synovium, left shoulder: Secondary | ICD-10-CM | POA: Diagnosis not present

## 2022-10-20 NOTE — Progress Notes (Signed)
Per order, Changed device settings for MRI to  DOO at 80 bpm  MRI mode  Will program device back to pre-MRI settings after completion of exam, and send transmission 

## 2022-10-21 ENCOUNTER — Other Ambulatory Visit: Payer: Self-pay | Admitting: Internal Medicine

## 2022-10-21 DIAGNOSIS — I2510 Atherosclerotic heart disease of native coronary artery without angina pectoris: Secondary | ICD-10-CM

## 2022-10-21 DIAGNOSIS — E785 Hyperlipidemia, unspecified: Secondary | ICD-10-CM

## 2022-10-23 ENCOUNTER — Other Ambulatory Visit: Payer: Self-pay | Admitting: Internal Medicine

## 2022-10-23 DIAGNOSIS — I2510 Atherosclerotic heart disease of native coronary artery without angina pectoris: Secondary | ICD-10-CM

## 2022-10-23 DIAGNOSIS — E785 Hyperlipidemia, unspecified: Secondary | ICD-10-CM

## 2022-10-23 NOTE — Telephone Encounter (Signed)
THE PRESCRIBED MEDICATION IS NOT COVERED BY INSURANCE. PLEASE CONSIDER CHANGING TO ONE OF THE SUGGESTED COVERED ALTERNATIVES.

## 2022-10-25 ENCOUNTER — Ambulatory Visit (INDEPENDENT_AMBULATORY_CARE_PROVIDER_SITE_OTHER): Payer: Medicare Other

## 2022-10-25 DIAGNOSIS — I495 Sick sinus syndrome: Secondary | ICD-10-CM

## 2022-10-25 DIAGNOSIS — M25512 Pain in left shoulder: Secondary | ICD-10-CM | POA: Diagnosis not present

## 2022-10-25 LAB — CUP PACEART REMOTE DEVICE CHECK
Battery Remaining Longevity: 127 mo
Battery Voltage: 3.01 V
Brady Statistic AP VP Percent: 0.04 %
Brady Statistic AP VS Percent: 37.73 %
Brady Statistic AS VP Percent: 0.05 %
Brady Statistic AS VS Percent: 62.17 %
Brady Statistic RA Percent Paced: 37.88 %
Brady Statistic RV Percent Paced: 0.1 %
Date Time Interrogation Session: 20240521212040
Implantable Lead Connection Status: 753985
Implantable Lead Connection Status: 753985
Implantable Lead Implant Date: 20200527
Implantable Lead Implant Date: 20200527
Implantable Lead Location: 753859
Implantable Lead Location: 753860
Implantable Lead Model: 5076
Implantable Lead Model: 5076
Implantable Pulse Generator Implant Date: 20200527
Lead Channel Impedance Value: 361 Ohm
Lead Channel Impedance Value: 399 Ohm
Lead Channel Impedance Value: 418 Ohm
Lead Channel Impedance Value: 589 Ohm
Lead Channel Pacing Threshold Amplitude: 0.5 V
Lead Channel Pacing Threshold Amplitude: 0.625 V
Lead Channel Pacing Threshold Pulse Width: 0.4 ms
Lead Channel Pacing Threshold Pulse Width: 0.4 ms
Lead Channel Sensing Intrinsic Amplitude: 12.625 mV
Lead Channel Sensing Intrinsic Amplitude: 12.625 mV
Lead Channel Sensing Intrinsic Amplitude: 5.125 mV
Lead Channel Sensing Intrinsic Amplitude: 5.125 mV
Lead Channel Setting Pacing Amplitude: 1.5 V
Lead Channel Setting Pacing Amplitude: 2.5 V
Lead Channel Setting Pacing Pulse Width: 0.4 ms
Lead Channel Setting Sensing Sensitivity: 2 mV
Zone Setting Status: 755011
Zone Setting Status: 755011

## 2022-10-30 ENCOUNTER — Other Ambulatory Visit (HOSPITAL_COMMUNITY): Payer: Self-pay

## 2022-10-30 ENCOUNTER — Telehealth: Payer: Self-pay

## 2022-10-30 NOTE — Telephone Encounter (Signed)
Patient Advocate Encounter   Received notification from [facility] that prior authorization for Omnitrop 5.8mg  solution is required.   PA submitted on 10/30/2022 [employer] [place] [employer] MEDICARE Status is pending     Waiting on clinical questions to populate

## 2022-10-31 DIAGNOSIS — M25512 Pain in left shoulder: Secondary | ICD-10-CM | POA: Diagnosis not present

## 2022-10-31 DIAGNOSIS — M25511 Pain in right shoulder: Secondary | ICD-10-CM | POA: Diagnosis not present

## 2022-11-01 ENCOUNTER — Telehealth: Payer: Self-pay | Admitting: Internal Medicine

## 2022-11-01 NOTE — Telephone Encounter (Signed)
Pt called stating that Dr. Yetta Barre Prescribe him Lipitor and he said he can't take Lipitor because is allergic to the med and Dr. Yetta Barre been took off that medication. Pt sates is has been taking Livalo but I don't see it on his med list. Please advise.

## 2022-11-01 NOTE — Telephone Encounter (Signed)
I was asked to change this

## 2022-11-02 NOTE — Telephone Encounter (Signed)
Called pt, LVM.   

## 2022-11-04 ENCOUNTER — Other Ambulatory Visit: Payer: Self-pay | Admitting: Internal Medicine

## 2022-11-10 ENCOUNTER — Other Ambulatory Visit (HOSPITAL_COMMUNITY): Payer: Self-pay

## 2022-11-15 NOTE — Progress Notes (Signed)
Remote pacemaker transmission.   

## 2022-11-16 ENCOUNTER — Other Ambulatory Visit: Payer: Self-pay | Admitting: Internal Medicine

## 2022-11-16 DIAGNOSIS — E039 Hypothyroidism, unspecified: Secondary | ICD-10-CM

## 2022-11-17 NOTE — Telephone Encounter (Signed)
Patient Advocate Encounter  Questions generated, answered and submitted. 

## 2022-11-19 ENCOUNTER — Other Ambulatory Visit: Payer: Self-pay | Admitting: Internal Medicine

## 2022-11-19 DIAGNOSIS — H6504 Acute serous otitis media, recurrent, right ear: Secondary | ICD-10-CM

## 2022-11-19 DIAGNOSIS — H6991 Unspecified Eustachian tube disorder, right ear: Secondary | ICD-10-CM

## 2022-11-22 ENCOUNTER — Other Ambulatory Visit: Payer: Self-pay | Admitting: Internal Medicine

## 2022-11-22 DIAGNOSIS — J4540 Moderate persistent asthma, uncomplicated: Secondary | ICD-10-CM

## 2022-11-27 ENCOUNTER — Ambulatory Visit: Payer: Medicare Other | Admitting: Physical Medicine and Rehabilitation

## 2022-11-28 ENCOUNTER — Other Ambulatory Visit (HOSPITAL_COMMUNITY): Payer: Self-pay

## 2022-11-28 NOTE — Telephone Encounter (Signed)
Pharmacy Patient Advocate Encounter  Received notification from [facility] that Prior Authorization for Omnitrope has been DENIED because not being prescribed for an FDA labeled or medically accepted use.[name]  PA #/Case ID/Reference #: PA Case ID #: [id number]  Please be advised we currently do not have a Pharmacist to review denials, therefore you will need to process appeals accordingly as needed. Thanks for your support at this time. Contact for appeals are as follows: Phone:  , Fax:      Will add phone & fax # once I'm able to access onbase again.

## 2022-11-29 ENCOUNTER — Encounter
Payer: Medicare Other | Attending: Physical Medicine and Rehabilitation | Admitting: Physical Medicine and Rehabilitation

## 2022-11-29 ENCOUNTER — Encounter: Payer: Self-pay | Admitting: Physical Medicine and Rehabilitation

## 2022-11-29 ENCOUNTER — Other Ambulatory Visit: Payer: Self-pay | Admitting: Internal Medicine

## 2022-11-29 VITALS — BP 135/73 | HR 83 | Ht 72.0 in | Wt 186.4 lb

## 2022-11-29 DIAGNOSIS — M5481 Occipital neuralgia: Secondary | ICD-10-CM | POA: Insufficient documentation

## 2022-11-29 DIAGNOSIS — M542 Cervicalgia: Secondary | ICD-10-CM | POA: Diagnosis not present

## 2022-11-29 DIAGNOSIS — E291 Testicular hypofunction: Secondary | ICD-10-CM

## 2022-11-29 NOTE — Progress Notes (Signed)
HPI: [name] Glass is a 82 y.o. male with PMHx has Coronary artery disease; OA (osteoarthritis) of knee; Gastroesophageal reflux disease without esophagitis; Essential hypertension, benign; Hyperlipidemia with target LDL less than 70; Chronic renal disease, stage 3, moderately decreased glomerular filtration rate (GFR) between 30-59 mL/min/1.73 square meter (HCC); Seasonal allergic rhinitis due to pollen; Benign prostatic hyperplasia without lower urinary tract symptoms; PSA elevation; Presence of permanent cardiac pacemaker; Sleep apnea; Hypothyroidism; Cervicalgia; Degenerative disc disease, cervical; Hypogonadism male; Moderate persistent asthma without complication; Chronic bronchitis, mucopurulent (HCC); Sinus node dysfunction (HCC); Thoracic aortic aneurysm (HCC); Moderate aortic stenosis; Chronic tension-type headache, not intractable; Postural kyphosis of cervicothoracic region; Cervico-occipital neuralgia of right side; and Prediabetes on their problem list. who presents to clinic for treatment of pain related to R occipital neuralgia and myofascial pain  via injection as described below.    States his shoulders are severely painful; R>L. Fell on the left shoulder and it actually helped while trying to kill a snake. Has been getting steroifd injections every 3 months, which help temporarily.   Physical Exam:  General: Appropriate appearance for age.  Mental Status: Appropriate mood and affect.  Cardiovascular: RRR, no m/r/g.  Respiratory: CTAB, no rales/rhonchi/wheezing.  Skin: No apparent rashes or lesions.  Neuro: Awake, alert, and oriented x3. No apparent deficits.  MSK:  Moving all 4 limbs antigravity and against resistance.  + TTP R occiput with radiation into upper head; + TTP R cervical paraspinals, trap, and b/l levator scapulae. +TTP R shoulder supraspinatus, deltoid   PROCEDURE:  Right  greater occipital nerve block and trigger point injections  Diagnosis:      ICD-10-CM    1.  Myofascial neck pain  M54.2 lidocaine (XYLOCAINE) 1 % (with pres) injection 6 mL     2. Cervico-occipital neuralgia of right side  M54.81 lidocaine (XYLOCAINE) 1 % (with pres) injection 6 mL       Goals with treatment: [ x ] Decrease pain [ x ] Improve Active / Passive ROM [ x ] Improve ADLs [  ] Improve functional mobility   MEDICATION:  [ x ] Kenalog 40 mg/mL  [ X ] Lidocaine 1%      CONSENT: Obtained in writing per policy. Consent uploaded to chart.   Benefits discussed.  Risks discussed included, but were not limited to, pain and discomfort, bleeding, bruising, allergic reaction, infection. All questions answered to patient/family member/guardian/ caregiver satisfaction. They would like to proceed with procedure. There are no noted contraindications to procedure.   PROCEDURE Time out was preformed No heat sources No antibiotics   The patient was explained about both the benefits and risks of a Right   occipital nerve block and TPI . After the patient acknowledged an understanding of the risks and benefits, the patient agreed to proceed. The area was first marked and then prepped in an aseptic fashion with betadine / alcohol. A 30 g, 1/2 inch needle was directed via a posterior approach into the Right   posterior occiput with 2cc medication mixture injected over the greater occipital nerve, then 0.5 cc medication per trigger point in the right cervical paraspinals, bilateral levator scapulae, and right trapezius and supraspinatus muscles. The injection was completed with Kenalog 40 mg/ml 0.2 cc mixed with 6 cc of 1% lidocaine after no blood was aspirated on pull back.   No complications were encountered. The patient tolerated the procedure well.   Impression: HPI: [name] Glass is a 82 y.o. male with PMHx has Coronary  artery disease; OA (osteoarthritis) of knee; Gastroesophageal reflux disease without esophagitis; Essential hypertension, benign; Hyperlipidemia with target LDL less  than 70; Chronic renal disease, stage 3, moderately decreased glomerular filtration rate (GFR) between 30-59 mL/min/1.73 square meter (HCC); Seasonal allergic rhinitis due to pollen; Benign prostatic hyperplasia without lower urinary tract symptoms; PSA elevation; Presence of permanent cardiac pacemaker; Sleep apnea; Hypothyroidism; Cervicalgia; Degenerative disc disease, cervical; Hypogonadism male; Moderate persistent asthma without complication; Chronic bronchitis, mucopurulent (HCC); Sinus node dysfunction (HCC); Thoracic aortic aneurysm (HCC); Moderate aortic stenosis; Chronic tension-type headache, not intractable; Postural kyphosis of cervicothoracic region; Cervico-occipital neuralgia of right side; and Prediabetes on their problem list. who presents to clinic for treatment of R occipital neuralgia and myofascial pain . They received a  Right  occipital nerve block and TPI  as above.    PLAN: - Resume Usual Activities. Notify Physician of any unusual bleeding, erythema or concern for side effects as reviewed above. - Apply ice prn for pain - Tylenol prn for pain - Follow up Q1M for repeat injections   Patient/Care Giver was ready to learn without apparent learning barriers. Education was provided on diagnosis, treatment options/plan according to patient's preferred learning style. Patient/Care Giver verbalized understanding and agreement with the above plan.

## 2022-11-29 NOTE — Patient Instructions (Signed)
-   Resume Usual Activities. Notify Physician of any unusual bleeding, erythema or concern for side effects as reviewed above. - Apply ice prn for pain - Tylenol prn for pain - Follow up once a Month for repeat injections

## 2022-12-01 ENCOUNTER — Other Ambulatory Visit: Payer: Self-pay | Admitting: Internal Medicine

## 2022-12-01 DIAGNOSIS — E039 Hypothyroidism, unspecified: Secondary | ICD-10-CM

## 2022-12-06 ENCOUNTER — Other Ambulatory Visit (HOSPITAL_COMMUNITY): Payer: Self-pay | Admitting: Orthopaedic Surgery

## 2022-12-06 ENCOUNTER — Encounter (HOSPITAL_COMMUNITY): Payer: Self-pay | Admitting: Orthopaedic Surgery

## 2022-12-06 DIAGNOSIS — M19011 Primary osteoarthritis, right shoulder: Secondary | ICD-10-CM

## 2022-12-06 DIAGNOSIS — M25511 Pain in right shoulder: Secondary | ICD-10-CM

## 2022-12-08 ENCOUNTER — Other Ambulatory Visit: Payer: Self-pay | Admitting: Orthopaedic Surgery

## 2022-12-08 DIAGNOSIS — M19011 Primary osteoarthritis, right shoulder: Secondary | ICD-10-CM

## 2022-12-17 ENCOUNTER — Other Ambulatory Visit: Payer: Self-pay | Admitting: Internal Medicine

## 2022-12-18 ENCOUNTER — Encounter: Payer: Self-pay | Admitting: Family Medicine

## 2022-12-18 ENCOUNTER — Ambulatory Visit (INDEPENDENT_AMBULATORY_CARE_PROVIDER_SITE_OTHER): Payer: Medicare Other | Admitting: Family Medicine

## 2022-12-18 VITALS — BP 126/54 | HR 70 | Temp 98.0°F | Resp 20 | Ht 72.0 in | Wt 189.0 lb

## 2022-12-18 DIAGNOSIS — N1831 Chronic kidney disease, stage 3a: Secondary | ICD-10-CM | POA: Diagnosis not present

## 2022-12-18 DIAGNOSIS — M2550 Pain in unspecified joint: Secondary | ICD-10-CM

## 2022-12-18 DIAGNOSIS — R21 Rash and other nonspecific skin eruption: Secondary | ICD-10-CM

## 2022-12-18 MED ORDER — TRIAMCINOLONE ACETONIDE 0.1 % EX CREA
1.0000 | TOPICAL_CREAM | Freq: Two times a day (BID) | CUTANEOUS | 1 refills | Status: DC
Start: 2022-12-18 — End: 2023-09-10

## 2022-12-18 NOTE — Progress Notes (Signed)
Assessment & Plan:  1. Polyarthralgia Discussed NSAIDs would be most helpful, but his kidney function had declined when he had labs previously.  I will prescribe a medication pending BMP results.  If GFR is greater than 60 I will send him an meloxicam 7.5 mg once daily, however if it is less than 60 I will send in tramadol 50 mg x 5 days.  We did discuss if that is the route I take he will need to follow-up with his PCP for further refills.  Education provided on arthritis. - Rheumatoid Arthritis Profile - Antinuclear Antib (ANA)  2. Stage 3a chronic kidney disease (HCC) Encouraged use of Tylenol instead of NSAIDs with current GFR. - Basic metabolic panel  3. Rash - triamcinolone cream (KENALOG) 0.1 %; Apply 1 Application topically 2 (two) times daily.  Dispense: 80 g; Refill: 1   Follow up plan: Return for as scheduled with PCP.  [name] Boston, MSN, APRN, FNP-C  Subjective:  HPI: [name] Glass is a 82 y.o. male presenting on 12/18/2022 for Back Pain (Neck and lower back pain/Also having bilateral shoulder and bilateral hip pain /Hx of arthritis  - sees Delbert [facility]ness for Ortho (had several cortisone injections in past) )  Patient reports he feels like his body is attacking him.  He is having pain in his neck, low back, bilateral shoulders, and bilateral hips.  States he has had pain in all of these places before, but not at the same time.  He is established with an orthopedic where he receives cortisone injections.  He is currently unable to sleep on his sides due to the pain.  He has been applying Voltaren gel, Biofreeze, and taking Advil 400 mg twice daily.  He is also concerned about a rash across his upper abdomen and back.  Denies any itching.    ROS: Negative unless specifically indicated above in HPI.   Relevant past medical history reviewed and updated as indicated.   Allergies and medications reviewed and updated.   Current Outpatient Medications:    acyclovir  (ZOVIRAX) 200 MG capsule, TAKE 1 CAPSULE BY MOUTH THREE TIMES A DAY, Disp: 270 capsule, Rfl: 0   anastrozole (ARIMIDEX) 1 MG tablet, TAKE 1 TABLET TWICE A WEEK, Disp: 24 tablet, Rfl: 1   Cholecalciferol (VITAMIN D-3) 125 MCG (5000 UT) TABS, Take 125 mcg by mouth daily., Disp: , Rfl:    diclofenac Sodium (VOLTAREN) 1 % GEL, Apply 2 g topically 4 (four) times daily., Disp: , Rfl:    ezetimibe (ZETIA) 10 MG tablet, TAKE 1 TABLET BY MOUTH EVERY DAY, Disp: 90 tablet, Rfl: 1   finasteride (PROSCAR) 5 MG tablet, TAKE 1 TABLET BY MOUTH EVERY DAY, Disp: 90 tablet, Rfl: 0   fluticasone (FLONASE) 50 MCG/ACT nasal spray, SPRAY 2 SPRAYS INTO EACH NOSTRIL EVERY DAY, Disp: 48 mL, Rfl: 1   hydrALAZINE (APRESOLINE) 25 MG tablet, Take by mouth., Disp: , Rfl:    ipratropium (ATROVENT) 0.03 % nasal spray, Place 2 sprays into both nostrils 3 (three) times daily as needed for rhinitis., Disp: 30 mL, Rfl: 12   levocetirizine (XYZAL) 5 MG tablet, TAKE 1 TABLET BY MOUTH EVERY DAY IN THE EVENING, Disp: 90 tablet, Rfl: 1   liothyronine (CYTOMEL) 25 MCG tablet, TAKE 1 TABLET BY MOUTH EVERY DAY IN THE MORNING, Disp: 90 tablet, Rfl: 0   montelukast (SINGULAIR) 10 MG tablet, TAKE 1 TABLET BY MOUTH EVERYDAY AT BEDTIME, Disp: 90 tablet, Rfl: 1   omeprazole (PRILOSEC) 20 MG  capsule, Take by mouth., Disp: , Rfl:    Pitavastatin Calcium 4 MG TABS, TAKE 1 TABLET EVERY DAY, Disp: 90 tablet, Rfl: 0   Probiotic Product (PRODIGEN) CAPS, Take 1 capsule by mouth daily., Disp: , Rfl:    Simethicone 40 MG/0.6ML LIQD, Take 0.6 mLs (40 mg total) by mouth 4 (four) times daily., Disp: 300 mL, Rfl: 0   Somatropin (OMNITROPE) 5 MG/1.5ML SOCT, Inject into the skin., Disp: , Rfl:    SYRINGE-NEEDLE, DISP, 3 ML 23G X 1" 3 ML MISC, Use to inject testosterone as prescribed., Disp: 50 each, Rfl: 0   tamsulosin (FLOMAX) 0.4 MG CAPS capsule, TAKE 1 CAPSULE BY MOUTH EVERY DAY IN THE MORNING, Disp: 90 capsule, Rfl: 1   testosterone cypionate  (DEPOTESTOSTERONE CYPIONATE) 200 MG/ML injection, INJECT 1.25 MLS (250 MG TOTAL) INTO THE MUSCLE EVERY 7 (SEVEN) DAYS., Disp: 10 mL, Rfl: 0   thyroid (ARMOUR THYROID) 60 MG tablet, Take 1 tablet (60 mg total) by mouth daily before breakfast., Disp: 90 tablet, Rfl: 1   umeclidinium-vilanterol (ANORO ELLIPTA) 62.5-25 MCG/ACT AEPB, Inhale into the lungs., Disp: , Rfl:    Nandrolone Decanoate 200 MG/ML OIL, Inject 100 mg as directed once a week. (Patient not taking: Reported on 12/18/2022), Disp: 10 mL, Rfl: 1   Omega-3 Fatty Acids (FISH OIL) 1200 MG CAPS, Take by mouth. (Patient not taking: Reported on 12/18/2022), Disp: , Rfl:    potassium gluconate 595 (99 K) MG TABS tablet, Take by mouth. (Patient not taking: Reported on 12/18/2022), Disp: , Rfl:    telmisartan (MICARDIS) 80 MG tablet, , Disp: , Rfl:    tiZANidine (ZANAFLEX) 4 MG tablet, TAKE 0.5-1 TABLET AT BEDTIME AS NEEDED FOR MUSCLE SPASMS (FOR NECK PAIN AND TIGHTNESS). (Patient not taking: Reported on 12/18/2022), Disp: 90 tablet, Rfl: 1  Allergies  Allergen Reactions   Morphine And Codeine     Had "crazy dreams" felt "crazy" per pt.   Bactrim [Sulfamethoxazole-Trimethoprim]    Morphine     Other Reaction(s): Not available   Statins    Sulfamethoxazole    Trimethoprim     Objective:   BP (!) 126/54   Pulse 70   Temp 98 F (36.7 C)   Resp 20   Ht 6' (1.829 m)   Wt 189 lb (85.7 kg)   BMI 25.63 kg/m    Physical Exam Vitals reviewed.  Constitutional:      General: He is not in acute distress.    Appearance: Normal appearance. He is not ill-appearing, toxic-appearing or diaphoretic.  HENT:     Head: Normocephalic and atraumatic.  Eyes:     General: No scleral icterus.       Right eye: No discharge.        Left eye: No discharge.     Conjunctiva/sclera: Conjunctivae normal.  Cardiovascular:     Rate and Rhythm: Normal rate.  Pulmonary:     Effort: Pulmonary effort is normal. No respiratory distress.  Musculoskeletal:         General: Normal range of motion.     Cervical back: Normal range of motion.  Skin:    General: Skin is warm and dry.     Findings: Rash present. Rash is macular (under both breasts and around to his back).  Neurological:     Mental Status: He is alert and oriented to person, place, and time. Mental status is at baseline.  Psychiatric:        Mood and Affect: Mood normal.  Behavior: Behavior normal.        Thought Content: Thought content normal.        Judgment: Judgment normal.

## 2022-12-19 LAB — BASIC METABOLIC PANEL
BUN: 28 mg/dL — ABNORMAL HIGH (ref 6–23)
CO2: 22 mEq/L (ref 19–32)
Calcium: 10.4 mg/dL (ref 8.4–10.5)
Chloride: 106 mEq/L (ref 96–112)
Creatinine, Ser: 1.05 mg/dL (ref 0.40–1.50)
GFR: 66.13 mL/min (ref >60.00)
Glucose, Bld: 86 mg/dL (ref 70–99)
Potassium: 4.1 mEq/L (ref 3.5–5.1)
Sodium: 139 mEq/L (ref 135–145)

## 2022-12-19 MED ORDER — MELOXICAM 7.5 MG PO TABS
7.5000 mg | ORAL_TABLET | Freq: Every day | ORAL | 1 refills | Status: DC
Start: 2022-12-19 — End: 2023-01-14

## 2022-12-19 NOTE — Addendum Note (Signed)
Addended by: Gwenlyn Fudge on: 12/19/2022 02:58 PM   Modules accepted: Orders

## 2022-12-20 LAB — ANTI-NUCLEAR AB-TITER (ANA TITER): ANA Titer 1: 1:40 {titer} — ABNORMAL HIGH

## 2022-12-20 LAB — RHEUMATOID ARTHRITIS PROFILE
Cyclic Citrullin Peptide Ab: 2 units (ref 0–19)
Rheumatoid fact SerPl-aCnc: 14.9 IU/mL — ABNORMAL HIGH (ref <14.0)

## 2022-12-20 LAB — ANA: Anti Nuclear Antibody (ANA): POSITIVE — AB

## 2022-12-22 ENCOUNTER — Ambulatory Visit
Admission: RE | Admit: 2022-12-22 | Discharge: 2022-12-22 | Disposition: A | Discharge status: Home / Self Care | Payer: Medicare Other | Source: Ambulatory Visit | Attending: Orthopaedic Surgery | Admitting: Orthopaedic Surgery

## 2022-12-22 ENCOUNTER — Other Ambulatory Visit: Payer: Self-pay | Admitting: *Deleted

## 2022-12-22 DIAGNOSIS — M75111 Incomplete rotator cuff tear or rupture of right shoulder, not specified as traumatic: Secondary | ICD-10-CM | POA: Diagnosis not present

## 2022-12-22 DIAGNOSIS — M19011 Primary osteoarthritis, right shoulder: Secondary | ICD-10-CM

## 2022-12-22 DIAGNOSIS — R768 Other specified abnormal immunological findings in serum: Secondary | ICD-10-CM

## 2022-12-22 DIAGNOSIS — M2550 Pain in unspecified joint: Secondary | ICD-10-CM

## 2022-12-22 DIAGNOSIS — M25511 Pain in right shoulder: Secondary | ICD-10-CM | POA: Diagnosis not present

## 2022-12-22 MED ORDER — IOPAMIDOL (ISOVUE-M 200) INJECTION 41%
12.0000 mL | Freq: Once | INTRAMUSCULAR | Status: AC
  Administered 2022-12-22: 12 mL via INTRA_ARTICULAR

## 2022-12-27 ENCOUNTER — Encounter: Payer: Self-pay | Admitting: Physical Medicine and Rehabilitation

## 2022-12-27 ENCOUNTER — Encounter
Payer: Medicare Other | Attending: Physical Medicine and Rehabilitation | Admitting: Physical Medicine and Rehabilitation

## 2022-12-27 VITALS — Ht 72.0 in | Wt 187.0 lb

## 2022-12-27 DIAGNOSIS — M542 Cervicalgia: Secondary | ICD-10-CM | POA: Diagnosis not present

## 2022-12-27 DIAGNOSIS — G894 Chronic pain syndrome: Secondary | ICD-10-CM

## 2022-12-27 DIAGNOSIS — M62838 Other muscle spasm: Secondary | ICD-10-CM | POA: Diagnosis not present

## 2022-12-27 DIAGNOSIS — G44229 Chronic tension-type headache, not intractable: Secondary | ICD-10-CM | POA: Diagnosis not present

## 2022-12-27 DIAGNOSIS — M5481 Occipital neuralgia: Secondary | ICD-10-CM | POA: Diagnosis not present

## 2022-12-27 DIAGNOSIS — M7918 Myalgia, other site: Secondary | ICD-10-CM | POA: Diagnosis not present

## 2022-12-27 MED ORDER — LIDOCAINE HCL 1 % IJ SOLN
6.0000 mL | Freq: Once | INTRAMUSCULAR | Status: AC
Start: 2022-12-27 — End: 2022-12-27
  Administered 2022-12-27: 6 mL via INTRADERMAL

## 2022-12-27 MED ORDER — TRIAMCINOLONE ACETONIDE 40 MG/ML IJ SUSP
5.0000 mg | Freq: Once | INTRAMUSCULAR | Status: AC
Start: 2022-12-27 — End: 2022-12-27
  Administered 2022-12-27: 5.2 mg via INTRAMUSCULAR

## 2022-12-27 MED ORDER — TRAMADOL HCL 50 MG PO TABS: 50.0000 mg | ORAL_TABLET | Freq: Every evening | ORAL | 0 refills | Status: DC | PRN

## 2022-12-27 NOTE — Progress Notes (Addendum)
HPI: [name] Glass is a 82 y.o. male with PMHx has Coronary artery disease; OA (osteoarthritis) of knee; Gastroesophageal reflux disease without esophagitis; Essential hypertension, benign; Hyperlipidemia with target LDL less than 70; Chronic renal disease, stage 3, moderately decreased glomerular filtration rate (GFR) between 30-59 mL/min/1.73 square meter (HCC); Seasonal allergic rhinitis due to pollen; Benign prostatic hyperplasia without lower urinary tract symptoms; PSA elevation; Presence of permanent cardiac pacemaker; Sleep apnea; Hypothyroidism; Cervicalgia; Degenerative disc disease, cervical; Hypogonadism male; Moderate persistent asthma without complication; Chronic bronchitis, mucopurulent (HCC); Sinus node dysfunction (HCC); Thoracic aortic aneurysm (HCC); Moderate aortic stenosis; Chronic tension-type headache, not intractable; Postural kyphosis of cervicothoracic region; Cervico-occipital neuralgia of right side; and Prediabetes on their problem list. who presents to clinic for treatment of pain related to R occipital neuralgia and myofascial pain  via injection as described below.    States pain overall has been getting worse, especially in right shoulder. Had a rheumatologic panel done recently with his PCP for pain and weakness, which is reported below. Has rheumatology referral but they are scheduling a long way out. Pain in the meantime is severely interfering with his sleep.     Latest Reference Range & Units 12/18/22 14:59  Anti Nuclear Antibody (ANA) NEGATIVE  POSITIVE !  ANA Pattern 1  Nuclear, Fine Speckled !  ANA Titer 1 titer 1:40 (H)  Cyclic Citrullin Peptide Ab 0 - 19 units 2  RA Latex Turbid. <14.0 IU/mL 14.9 (H)  !: Data is abnormal (H): Data is abnormally high    Physical Exam:  General: Appropriate appearance for age.  Mental Status: Appropriate mood and affect.  Cardiovascular: RRR, no m/r/g.  Respiratory: CTAB, no rales/rhonchi/wheezing.  Skin: No apparent  rashes or lesions.  Neuro: Awake, alert, and oriented x3. No apparent deficits.  MSK:  Moving all 4 limbs antigravity and against resistance.  + TTP R occiput with radiation into upper head; + TTP R cervical paraspinals, trap, and b/l levator scapulae. +TTP R shoulder supraspinatus, deltoid Exaggerated thoracic kyphosis with head/neck forward flexion at relaxed position - unchanged. AROM in extension, sidebending, and rotation remain extremely limited.    PROCEDURE:  Right  greater occipital nerve block and trigger point injections   Diagnosis:  Myalgia, shoulder Tension type headache Myofascial pain   Goals with treatment: [ x ] Decrease pain [ x ] Improve Active / Passive ROM [ x ] Improve ADLs [  ] Improve functional mobility   MEDICATION:  [ x ] Kenalog 40 mg/mL  [ X ] Lidocaine 1%      CONSENT: Obtained in writing per policy. Consent uploaded to chart.   Benefits discussed.  Risks discussed included, but were not limited to, pain and discomfort, bleeding, bruising, allergic reaction, infection. All questions answered to patient/family member/guardian/ caregiver satisfaction. They would like to proceed with procedure. There are no noted contraindications to procedure.   PROCEDURE Time out was preformed No heat sources No antibiotics   The patient was explained about both the benefits and risks of a Right   occipital nerve block and TPI . After the patient acknowledged an understanding of the risks and benefits, the patient agreed to proceed. The area was first marked and then prepped in an aseptic fashion with betadine / alcohol. A 30 g, 1/2 inch needle was directed via a posterior approach into the Right   posterior occiput with 2cc medication mixture injected over the greater occipital nerve, then 0.5 cc medication per trigger point in the right cervical  paraspinals, bilateral levator scapulae, and right trapezius and supraspinatus muscles. The injection was completed with  Kenalog 40 mg/ml 0.2 cc mixed with 6 cc of 1% lidocaine after no blood was aspirated on pull back.   No complications were encountered. The patient tolerated the procedure well.   Impression: HPI: [name] Glass is a 82 y.o. male with PMHx has Coronary artery disease; OA (osteoarthritis) of knee; Gastroesophageal reflux disease without esophagitis; Essential hypertension, benign; Hyperlipidemia with target LDL less than 70; Chronic renal disease, stage 3, moderately decreased glomerular filtration rate (GFR) between 30-59 mL/min/1.73 square meter (HCC); Seasonal allergic rhinitis due to pollen; Benign prostatic hyperplasia without lower urinary tract symptoms; PSA elevation; Presence of permanent cardiac pacemaker; Sleep apnea; Hypothyroidism; Cervicalgia; Degenerative disc disease, cervical; Hypogonadism male; Moderate persistent asthma without complication; Chronic bronchitis, mucopurulent (HCC); Sinus node dysfunction (HCC); Thoracic aortic aneurysm (HCC); Moderate aortic stenosis; Chronic tension-type headache, not intractable; Postural kyphosis of cervicothoracic region; Cervico-occipital neuralgia of right side; and Prediabetes on their problem list. who presents to clinic for treatment of R occipital neuralgia and myofascial pain . They received a  Right  occipital nerve block and TPI  as above.    PLAN: - Resume Usual Activities. Notify Physician of any unusual bleeding, erythema or concern for side effects as reviewed above. - Apply ice prn for pain - Tylenol prn for pain - Follow up Q1M for repeat injections - Today, I prescribed tramadol 50 mg as needed nightly half a tab #20 tabs with no refills. As this is a short-term script intended to get the patient to his rheumatology visit, no pain contract was signed today; we may consider this next visit if it is of benefit.  - If the pain becomes worse and you cannot make it to your next appointment, let me know and we will prescribe a steroid  dosepak.   Patient/Care [name] Glass was ready to learn without apparent learning barriers. Education was provided on diagnosis, treatment options/plan according to patient's preferred learning style. Patient/Care Giver verbalized understanding and agreement with the above plan.

## 2022-12-27 NOTE — Patient Instructions (Addendum)
-   Resume Usual Activities. Notify Physician of any unusual bleeding, erythema or concern for side effects as reviewed above. - Apply ice prn for pain - Tylenol prn for pain - Follow up Q1M for repeat injections - Today, I prescribed tramadol 50 mg as needed nightly half a tab #20 tabs with no refills.  - If the pain becomes worse and you cannot make it to your next appointment, let me know and we will prescribe a steroid dosepak

## 2023-01-02 DIAGNOSIS — M25511 Pain in right shoulder: Secondary | ICD-10-CM | POA: Diagnosis not present

## 2023-01-03 ENCOUNTER — Encounter (INDEPENDENT_AMBULATORY_CARE_PROVIDER_SITE_OTHER): Payer: Self-pay

## 2023-01-04 DIAGNOSIS — D045 Carcinoma in situ of skin of trunk: Secondary | ICD-10-CM | POA: Diagnosis not present

## 2023-01-04 DIAGNOSIS — B078 Other viral warts: Secondary | ICD-10-CM | POA: Diagnosis not present

## 2023-01-04 DIAGNOSIS — Z85820 Personal history of malignant melanoma of skin: Secondary | ICD-10-CM | POA: Diagnosis not present

## 2023-01-04 DIAGNOSIS — L570 Actinic keratosis: Secondary | ICD-10-CM | POA: Diagnosis not present

## 2023-01-04 DIAGNOSIS — L723 Sebaceous cyst: Secondary | ICD-10-CM | POA: Diagnosis not present

## 2023-01-04 DIAGNOSIS — D485 Neoplasm of uncertain behavior of skin: Secondary | ICD-10-CM | POA: Diagnosis not present

## 2023-01-11 ENCOUNTER — Encounter: Payer: Self-pay | Admitting: Internal Medicine

## 2023-01-11 ENCOUNTER — Ambulatory Visit: Payer: Medicare Other | Admitting: Internal Medicine

## 2023-01-11 VITALS — BP 118/62 | HR 92 | Temp 97.8°F | Ht 72.0 in | Wt 185.0 lb

## 2023-01-11 DIAGNOSIS — N1831 Chronic kidney disease, stage 3a: Secondary | ICD-10-CM | POA: Diagnosis not present

## 2023-01-11 DIAGNOSIS — E039 Hypothyroidism, unspecified: Secondary | ICD-10-CM | POA: Diagnosis not present

## 2023-01-11 DIAGNOSIS — R972 Elevated prostate specific antigen [PSA]: Secondary | ICD-10-CM | POA: Diagnosis not present

## 2023-01-11 DIAGNOSIS — N400 Enlarged prostate without lower urinary tract symptoms: Secondary | ICD-10-CM | POA: Diagnosis not present

## 2023-01-11 DIAGNOSIS — M5030 Other cervical disc degeneration, unspecified cervical region: Secondary | ICD-10-CM | POA: Diagnosis not present

## 2023-01-11 DIAGNOSIS — I10 Essential (primary) hypertension: Secondary | ICD-10-CM

## 2023-01-11 DIAGNOSIS — M5481 Occipital neuralgia: Secondary | ICD-10-CM | POA: Diagnosis not present

## 2023-01-11 DIAGNOSIS — M1711 Unilateral primary osteoarthritis, right knee: Secondary | ICD-10-CM | POA: Diagnosis not present

## 2023-01-11 LAB — CBC WITH DIFFERENTIAL/PLATELET
Basophils Absolute: 0 10*3/uL (ref 0.0–0.1)
Basophils Relative: 0.5 % (ref 0.0–3.0)
Eosinophils Absolute: 0.1 10*3/uL (ref 0.0–0.7)
Eosinophils Relative: 1.4 % (ref 0.0–5.0)
HCT: 46 % (ref 39.0–52.0)
Hemoglobin: 15.1 g/dL (ref 13.0–17.0)
Lymphocytes Relative: 27.6 % (ref 12.0–46.0)
Lymphs Abs: 2.3 10*3/uL (ref 0.7–4.0)
MCHC: 32.8 g/dL (ref 30.0–36.0)
MCV: 104.6 fl — ABNORMAL HIGH (ref 78.0–100.0)
Monocytes Absolute: 1.1 10*3/uL — ABNORMAL HIGH (ref 0.1–1.0)
Monocytes Relative: 13.1 % — ABNORMAL HIGH (ref 3.0–12.0)
Neutro Abs: 4.7 10*3/uL (ref 1.4–7.7)
Neutrophils Relative %: 57.4 % (ref 43.0–77.0)
Platelets: 286 10*3/uL (ref 150.0–400.0)
RBC: 4.39 Mil/uL (ref 4.22–5.81)
RDW: 14 % (ref 11.5–15.5)
WBC: 8.2 10*3/uL (ref 4.0–10.5)

## 2023-01-11 LAB — BASIC METABOLIC PANEL
BUN: 30 mg/dL — ABNORMAL HIGH (ref 6–23)
CO2: 28 mEq/L (ref 19–32)
Calcium: 10.5 mg/dL (ref 8.4–10.5)
Chloride: 105 mEq/L (ref 96–112)
Creatinine, Ser: 1.19 mg/dL (ref 0.40–1.50)
GFR: 56.88 mL/min — ABNORMAL LOW (ref >60.00)
Glucose, Bld: 94 mg/dL (ref 70–99)
Potassium: 4.1 mEq/L (ref 3.5–5.1)
Sodium: 141 mEq/L (ref 135–145)

## 2023-01-11 LAB — PSA: PSA: 0.75 ng/mL (ref 0.10–4.00)

## 2023-01-11 MED ORDER — TRAMADOL HCL 50 MG PO TABS
50.0000 mg | ORAL_TABLET | Freq: Three times a day (TID) | ORAL | 0 refills | Status: AC | PRN
Start: 2023-01-11 — End: 2023-04-11

## 2023-01-11 NOTE — Patient Instructions (Signed)
Chronic Pain, Adult Chronic pain is a type of pain that lasts or keeps coming back for at least 3-6 months. You may have headaches, pain in the abdomen, or pain in other areas of the body. Chronic pain may be related to an illness, injury, or a health condition. Sometimes, the cause of chronic pain is not known. Chronic pain can make it hard for you to do daily activities. If it is not treated, chronic pain can lead to anxiety and depression. Treatment depends on the cause of your pain and how severe it is. You may need to work with a pain specialist to come up with a treatment plan. Many people benefit from two or more types of treatment to control their pain. Follow these instructions at home: Treatment plan Follow your treatment plan as told by your health care provider. This may include: Gentle, regular exercise. Eating a healthy diet that includes foods such as vegetables, fruits, fish, and lean meats. Mental health therapy (cognitive or behavioral therapy) that changes the way you think or act in response to the pain. This may help improve how you feel. Doing physical therapy exercises to improve movement and strength. Meditation, yoga, acupuncture, or massage therapy. Using the oils from plants in your environment or on your skin (aromatherapy). Other treatments may include: Over-the-counter or prescription medicines. Color, light, or sound therapy. Local electrical stimulation. The electrical pulses help to relieve pain by temporarily stopping the nerve impulses that cause you to feel pain. Injections. These deliver numbing or pain-relieving medicines into the spine or the area of pain.  Medicines Take over-the-counter and prescription medicines only as told by your health care provider. Ask your health care provider if the medicine prescribed to you: Requires you to avoid driving or using machinery. Can cause constipation. You may need to take these actions to prevent or treat  constipation: Drink enough fluid to keep your urine pale yellow. Take over-the-counter or prescription medicines. Eat foods that are high in fiber, such as beans, whole grains, and fresh fruits and vegetables. Limit foods that are high in fat and processed sugars, such as fried or sweet foods. Lifestyle  Ask your health care provider whether you should keep a pain diary. Your health care provider will tell you what information to write in the diary. This may include: When you have pain. What the pain feels like. How medicines and other behaviors or treatments help to reduce the pain. Consider talking with a mental health care provider about how to help manage chronic pain. Consider joining a chronic pain support group. Try to control or lower your stress levels. Talk with your health care provider about ways to do this. General instructions Learn as much as you can about how to manage your chronic pain. Ask your health care provider if an intensive pain rehabilitation program or a chronic pain specialist would be helpful. Check your pain level as told by your health care provider. Ask your health care provider if you should use a pain scale. Contact a health care provider if: Your pain is not controlled with treatment. You have new pain. You have side effects from pain medicine. You feel weak or you have trouble doing your normal activities. You have trouble sleeping or you develop confusion. You lose feeling or have numbness in your body. You lose control of your bowels or bladder. Get help right away if: Your pain suddenly gets much worse. You develop chest pain. You have trouble breathing or shortness of  breath. You faint, or another person sees you faint. These symptoms may be an emergency. Get help right away. Call 911. Do not wait to see if the symptoms will go away. Do not drive yourself to the hospital. Also, get help right away if: You have thoughts about hurting yourself  or others. Take one of these steps if you feel like you may hurt yourself or others, or have thoughts about taking your own life: Go to your nearest emergency room. Call 911. Call the National Suicide Prevention Lifeline at 613-055-8912 or 988. This is open 24 hours a day. Text the Crisis Text Line at 323-193-1954. This information is not intended to replace advice given to you by your health care provider. Make sure you discuss any questions you have with your health care provider. Document Revised: 01/11/2022 Document Reviewed: 12/14/2021 Elsevier Patient Education  2024 [employer].

## 2023-01-11 NOTE — Progress Notes (Unsigned)
Subjective:  Patient ID: [name] Glass, male    DOB: [birth year]  Age: 82 y.o. MRN: [id number]  CC: Hypertension, Hypothyroidism, Hyperlipidemia, and Osteoarthritis   HPI [name] Glass presents for f/up -----  Discussed the use of AI scribe software for clinical note transcription with the patient, who gave verbal consent to proceed.  History of Present Illness   The patient presents with severe bilateral shoulder pain, described as a constant 'eight or nine' on a scale of ten, which is significantly impacting their sleep. The pain is so severe that they are unable to sleep on either side, and it often extends to their hips, glutes, and knees, causing them to resort to sleeping in a recliner. The patient has received corticosteroid injections in both shoulders, which provided temporary relief for about two and a half weeks, but the pain returned.  The patient also reports having nerve blocks in their neck every month for an unspecified condition. They have been prescribed Meloxicam and Tramadol for pain management, both of which initially provided significant relief and improved sleep, but the effects have since worn off.  The patient denies any symptoms of prostate cancer or significant urinary issues. They report a slight shortness of breath but deny any dizziness or lightheadedness. There is no reported swelling in the legs or feet. 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 They are currently on a course of Tramadol, which is due for a refill.       Outpatient Medications Prior to Visit  Medication Sig Dispense Refill   acyclovir (ZOVIRAX) 200 MG capsule TAKE 1 CAPSULE BY MOUTH THREE TIMES A DAY 270 capsule 0   anastrozole (ARIMIDEX) 1 MG tablet TAKE 1 TABLET TWICE A WEEK 24 tablet 1   Cholecalciferol (VITAMIN D-3) 125 MCG (5000 UT) TABS Take 125 mcg by mouth daily.     diclofenac Sodium (VOLTAREN) 1 % GEL Apply 2 g topically 4  (four) times daily.     ezetimibe (ZETIA) 10 MG tablet TAKE 1 TABLET BY MOUTH EVERY DAY 90 tablet 1   finasteride (PROSCAR) 5 MG tablet TAKE 1 TABLET BY MOUTH EVERY DAY 90 tablet 0   fluticasone (FLONASE) 50 MCG/ACT nasal spray SPRAY 2 SPRAYS INTO EACH NOSTRIL EVERY DAY 48 mL 1   ipratropium (ATROVENT) 0.03 % nasal spray Place 2 sprays into both nostrils 3 (three) times daily as needed for rhinitis. 30 mL 12   levocetirizine (XYZAL) 5 MG tablet TAKE 1 TABLET BY MOUTH EVERY DAY IN THE EVENING 90 tablet 1   montelukast (SINGULAIR) 10 MG tablet TAKE 1 TABLET BY MOUTH EVERYDAY AT BEDTIME 90 tablet 1   Nandrolone Decanoate 200 MG/ML OIL Inject 100 mg as directed once a week. 10 mL 1   Omega-3 Fatty Acids (FISH OIL) 1200 MG CAPS Take by mouth.     omeprazole (PRILOSEC) 20 MG capsule Take by mouth.     Pitavastatin Calcium 4 MG TABS TAKE 1 TABLET EVERY DAY 90 tablet 0   potassium gluconate 595 (99 K) MG TABS tablet Take by mouth.     Probiotic Product (PRODIGEN) CAPS Take 1 capsule by mouth daily.     Simethicone 40 MG/0.6ML LIQD Take 0.6 mLs (40 mg total) by mouth 4 (four) times daily. 300 mL 0   Somatropin (OMNITROPE) 5 MG/1.5ML SOCT Inject into the skin.     SYRINGE-NEEDLE, DISP, 3 ML 23G X 1" 3 ML MISC Use to  inject testosterone as prescribed. 50 each 0   tamsulosin (FLOMAX) 0.4 MG CAPS capsule TAKE 1 CAPSULE BY MOUTH EVERY DAY IN THE MORNING 90 capsule 1   telmisartan (MICARDIS) 80 MG tablet      testosterone cypionate (DEPOTESTOSTERONE CYPIONATE) 200 MG/ML injection INJECT 1.25 MLS (250 MG TOTAL) INTO THE MUSCLE EVERY 7 (SEVEN) DAYS. 10 mL 0   thyroid (ARMOUR THYROID) 60 MG tablet Take 1 tablet (60 mg total) by mouth daily before breakfast. 90 tablet 1   tiZANidine (ZANAFLEX) 4 MG tablet TAKE 0.5-1 TABLET AT BEDTIME AS NEEDED FOR MUSCLE SPASMS (FOR NECK PAIN AND TIGHTNESS). 90 tablet 1   triamcinolone cream (KENALOG) 0.1 % Apply 1 Application topically 2 (two) times daily. 80 g 1    umeclidinium-vilanterol (ANORO ELLIPTA) 62.5-25 MCG/ACT AEPB Inhale into the lungs.     hydrALAZINE (APRESOLINE) 25 MG tablet Take by mouth.     liothyronine (CYTOMEL) 25 MCG tablet TAKE 1 TABLET BY MOUTH EVERY DAY IN THE MORNING 90 tablet 0   meloxicam (MOBIC) 7.5 MG tablet Take 1 tablet (7.5 mg total) by mouth daily. 30 tablet 1   traMADol (ULTRAM) 50 MG tablet Take 1 tablet (50 mg total) by mouth at bedtime as needed. 20 tablet 0   No facility-administered medications prior to visit.    ROS Review of Systems  Constitutional: Negative.  Negative for appetite change and unexpected weight change.  HENT:  Positive for trouble swallowing and voice change.   Eyes: Negative.   Respiratory:  Positive for shortness of breath. Negative for cough, chest tightness and wheezing.   Cardiovascular:  Negative for chest pain, palpitations and leg swelling.  Gastrointestinal: Negative.  Negative for abdominal pain, constipation, diarrhea, nausea and vomiting.  Endocrine: Negative.   Genitourinary: Negative.  Negative for difficulty urinating.  Musculoskeletal:  Positive for arthralgias, back pain, gait problem and neck pain. Negative for myalgias.  Skin: Negative.  Negative for color change.  Neurological:  Negative for dizziness and weakness.  Hematological:  Negative for adenopathy. Does not bruise/bleed easily.  Psychiatric/Behavioral:  Positive for sleep disturbance.     Objective:  BP 118/62 (BP Location: Right Arm, Patient Position: Sitting, Cuff Size: Large)   Pulse 92   Temp 97.8 F (36.6 C) (Oral)   Ht 6' (1.829 m)   Wt 185 lb (83.9 kg)   SpO2 91%   BMI 25.09 kg/m   BP Readings from Last 3 Encounters:  01/11/23 118/62  12/18/22 (!) 126/54  11/29/22 135/73    Wt Readings from Last 3 Encounters:  01/11/23 185 lb (83.9 kg)  12/27/22 187 lb (84.8 kg)  12/18/22 189 lb (85.7 kg)    Physical Exam Vitals reviewed.  Constitutional:      Appearance: Normal appearance.  HENT:      Mouth/Throat:     Mouth: Mucous membranes are moist.  Eyes:     General: No scleral icterus.    Conjunctiva/sclera: Conjunctivae normal.  Cardiovascular:     Rate and Rhythm: Normal rate and regular rhythm.     Heart sounds: Murmur heard.     Systolic murmur is present with a grade of 2/6.     No diastolic murmur is present.     No friction rub. No gallop.  Pulmonary:     Effort: Pulmonary effort is normal.     Breath sounds: No stridor. No wheezing, rhonchi or rales.  Abdominal:     General: Abdomen is flat.     Palpations: There is  no mass.     Tenderness: There is no abdominal tenderness. There is no guarding.     Hernia: No hernia is present.  Musculoskeletal:        General: Normal range of motion.     Cervical back: Neck supple.     Right lower leg: No edema.     Left lower leg: No edema.  Lymphadenopathy:     Cervical: No cervical adenopathy.  Skin:    General: Skin is warm and dry.  Neurological:     General: No focal deficit present.     Mental Status: He is alert. Mental status is at baseline.  Psychiatric:        Mood and Affect: Mood normal.        Behavior: Behavior normal.     Lab Results  Component Value Date   WBC 8.2 01/11/2023   HGB 15.1 01/11/2023   HCT 46.0 01/11/2023   PLT 286.0 01/11/2023   GLUCOSE 94 01/11/2023   CHOL 148 07/05/2022   TRIG 242.0 (H) 07/05/2022   HDL 45.80 07/05/2022   LDLDIRECT 75.0 07/05/2022   LDLCALC 79 08/10/2021   ALT 16 07/05/2022   AST 16 07/05/2022   NA 141 01/11/2023   K 4.1 01/11/2023   CL 105 01/11/2023   CREATININE 1.19 01/11/2023   BUN 30 (H) 01/11/2023   CO2 28 01/11/2023   TSH 0.65 01/11/2023   PSA 0.75 01/11/2023   INR 0.96 07/28/2014   HGBA1C 5.8 07/05/2022    CT SHOULDER RIGHT W CONTRAST  Result Date: 01/02/2023 CLINICAL DATA:  Right shoulder pain, history of arthritis. EXAM: CT ARTHROGRAPHY OF THE RIGHT SHOULDER TECHNIQUE: Multidetector CT imaging was performed following the standard protocol  after injection of dilute contrast into the joint. RADIATION DOSE REDUCTION: This exam was performed according to the departmental dose-optimization program which includes automated exposure control, adjustment of the mA and/or kV according to patient size and/or use of iterative reconstruction technique. COMPARISON:  None Available. FINDINGS: Rotator cuff: Partial thickness bursal surface tear of the supraspinatus tendon 2.7 cm from the peripheral insertion. Infraspinatus tendon is intact. Teres minor tendon is intact. Subscapularis tendon is intact. Muscles: No muscle atrophy. No intramuscular fluid collection or hematoma. Biceps Long Head: Intraarticular and extraarticular portions of the biceps tendon are intact. Acromioclavicular Joint: Mild arthropathy of the acromioclavicular joint. No subacromial/subdeltoid bursal fluid. Glenohumeral Joint: Intraarticular contrast distending the joint capsule. Normal glenohumeral ligaments. Mild partial-thickness cartilage loss of the glenohumeral joint. Labrum: Intact. Bones: No fracture or dislocation. No aggressive osseous lesion. Other: No fluid collection or hematoma. 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 Mild thoracic aortic atherosclerosis. IMPRESSION: 1. Partial thickness bursal surface tear of the supraspinatus tendon 2.7 cm from the peripheral insertion. 2. Mild osteoarthritis of the glenohumeral joint. 3. 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 Recommend annual imaging followup by CTA or MRA. This recommendation follows 2010 ACCF/AHA/AATS/ACR/ASA/SCA/SCAI/SIR/STS/SVM Guidelines for the Diagnosis and Management of Patients with Thoracic Aortic Disease. Circulation. 2010; 121: H086-V784. Aortic aneurysm NOS (ICD10-I71.9) 4.  Aortic Atherosclerosis (ICD10-I70.0). Electronically Signed   By: Elige Ko M.D.   On: 01/02/2023 06:50   DG FLUORO GUIDED NEEDLE PLC ASPIRATION/INJECTION LOC  Result Date: 12/22/2022 CLINICAL DATA:  Chronic right shoulder pain. EXAM: RIGHT SHOULDER INJECTION UNDER FLUOROSCOPY COMPARISON:  None Available. FLUOROSCOPY: Radiation Exposure Index (as provided  by the fluoroscopic device): 0.4 mGy Kerma PROCEDURE: The risks and benefits of the procedure were discussed with the patient, and written informed consent was obtained. The patient stated no history of allergy to contrast media. A formal timeout procedure was performed with the patient according to departmental protocol. The patient was placed supine on the fluoroscopy table and the right glenohumeral joint was identified under fluoroscopy. The skin overlying the right glenohumeral joint was subsequently cleaned with Betadine and a sterile drape was placed over the area of interest. 2 ml 1% Lidocaine was used to anesthetize the skin around the needle insertion site. A 22 gauge spinal needle was inserted into the right glenohumeral joint under fluoroscopy. 12 ml of contrast mixture (10 ml of sterile saline and 10 ml of Isovue-M 200 contrast) were injected into the right glenohumeral joint. The needle was removed and hemostasis was achieved. The patient was subsequently transferred to CT for imaging. IMPRESSION: Technically successful right shoulder injection under fluoroscopy for CT. Electronically Signed   By: Obie Dredge M.D.   On: 12/22/2022 14:47    Assessment & Plan:  Degenerative disc disease, cervical -     traMADol HCl; Take 1 tablet (50 mg total) by mouth every 8 (eight) hours as needed.  Dispense: 270 tablet; Refill: 0  Cervico-occipital neuralgia of right side -     traMADol HCl; Take 1 tablet (50 mg total) by mouth every 8 (eight) hours as needed.  Dispense: 270 tablet; Refill: 0  Benign prostatic hyperplasia without lower urinary tract symptoms -     PSA; Future  Stage 3a chronic kidney disease  (HCC)- Will avoid nephrotoxic agents. -     CBC with Differential/Platelet; Future -     Basic metabolic panel; Future  Essential hypertension, benign- His blood pressure is well-controlled. -     Thyroid Panel With TSH; Future -     CBC with Differential/Platelet; Future -     Basic metabolic panel; Future  Acquired hypothyroidism- He is euthyroid. -     Thyroid Panel With TSH; Future -     Liothyronine Sodium; Take 1 tablet (25 mcg total) by mouth daily.  Dispense: 90 tablet; Refill: 0  Primary osteoarthritis of right knee -     traMADol HCl; Take 1 tablet (50 mg total) by mouth every 8 (eight) hours as needed.  Dispense: 270 tablet; Refill: 0  PSA elevation- PSA is down to 0.75. -     PSA; Future     Follow-up: Return if symptoms worsen or fail to improve.  Sanda Linger, MD

## 2023-01-12 MED ORDER — LIOTHYRONINE SODIUM 25 MCG PO TABS
25.0000 ug | ORAL_TABLET | Freq: Every day | ORAL | 0 refills | Status: DC
Start: 2023-01-12 — End: 2023-04-27

## 2023-01-22 ENCOUNTER — Ambulatory Visit (INDEPENDENT_AMBULATORY_CARE_PROVIDER_SITE_OTHER): Payer: Medicare Other | Admitting: Internal Medicine

## 2023-01-22 ENCOUNTER — Encounter: Payer: Self-pay | Admitting: Internal Medicine

## 2023-01-22 VITALS — BP 114/70 | HR 67 | Temp 98.4°F | Resp 16 | Ht 72.0 in | Wt 187.8 lb

## 2023-01-22 DIAGNOSIS — I350 Nonrheumatic aortic (valve) stenosis: Secondary | ICD-10-CM

## 2023-01-22 DIAGNOSIS — E039 Hypothyroidism, unspecified: Secondary | ICD-10-CM

## 2023-01-22 DIAGNOSIS — I10 Essential (primary) hypertension: Secondary | ICD-10-CM

## 2023-01-22 DIAGNOSIS — N400 Enlarged prostate without lower urinary tract symptoms: Secondary | ICD-10-CM | POA: Diagnosis not present

## 2023-01-22 NOTE — Progress Notes (Unsigned)
Subjective:  Patient ID: [name] Glass, male    DOB: February 26, 1941  Age: 82 y.o. MRN: [id number]  CC: Osteoarthritis and Gastroesophageal Reflux   HPI [name] Glass presents for f/up ----  He is controlling the MSK pain with tramadol.  Outpatient Medications Prior to Visit  Medication Sig Dispense Refill   acyclovir (ZOVIRAX) 200 MG capsule TAKE 1 CAPSULE BY MOUTH THREE TIMES A DAY 270 capsule 0   anastrozole (ARIMIDEX) 1 MG tablet TAKE 1 TABLET TWICE A WEEK 24 tablet 1   Cholecalciferol (VITAMIN D-3) 125 MCG (5000 UT) TABS Take 125 mcg by mouth daily.     diclofenac Sodium (VOLTAREN) 1 % GEL Apply 2 g topically 4 (four) times daily.     ezetimibe (ZETIA) 10 MG tablet TAKE 1 TABLET BY MOUTH EVERY DAY 90 tablet 1   finasteride (PROSCAR) 5 MG tablet TAKE 1 TABLET BY MOUTH EVERY DAY 90 tablet 0   fluticasone (FLONASE) 50 MCG/ACT nasal spray SPRAY 2 SPRAYS INTO EACH NOSTRIL EVERY DAY 48 mL 1   ipratropium (ATROVENT) 0.03 % nasal spray Place 2 sprays into both nostrils 3 (three) times daily as needed for rhinitis. 30 mL 12   levocetirizine (XYZAL) 5 MG tablet TAKE 1 TABLET BY MOUTH EVERY DAY IN THE EVENING 90 tablet 1   liothyronine (CYTOMEL) 25 MCG tablet Take 1 tablet (25 mcg total) by mouth daily. 90 tablet 0   montelukast (SINGULAIR) 10 MG tablet TAKE 1 TABLET BY MOUTH EVERYDAY AT BEDTIME 90 tablet 1   Nandrolone Decanoate 200 MG/ML OIL Inject 100 mg as directed once a week. 10 mL 1   Omega-3 Fatty Acids (FISH OIL) 1200 MG CAPS Take by mouth.     omeprazole (PRILOSEC) 20 MG capsule Take by mouth.     Pitavastatin Calcium 4 MG TABS TAKE 1 TABLET EVERY DAY 90 tablet 0   potassium gluconate 595 (99 K) MG TABS tablet Take by mouth.     Probiotic Product (PRODIGEN) CAPS Take 1 capsule by mouth daily.     Simethicone 40 MG/0.6ML LIQD Take 0.6 mLs (40 mg total) by mouth 4 (four) times daily. 300 mL 0   Somatropin (OMNITROPE) 5 MG/1.5ML SOCT Inject into the skin.     SYRINGE-NEEDLE, DISP, 3  ML 23G X 1" 3 ML MISC Use to inject testosterone as prescribed. 50 each 0   tamsulosin (FLOMAX) 0.4 MG CAPS capsule TAKE 1 CAPSULE BY MOUTH EVERY DAY IN THE MORNING 90 capsule 1   telmisartan (MICARDIS) 80 MG tablet      testosterone cypionate (DEPOTESTOSTERONE CYPIONATE) 200 MG/ML injection INJECT 1.25 MLS (250 MG TOTAL) INTO THE MUSCLE EVERY 7 (SEVEN) DAYS. 10 mL 0   thyroid (ARMOUR THYROID) 60 MG tablet Take 1 tablet (60 mg total) by mouth daily before breakfast. 90 tablet 1   tiZANidine (ZANAFLEX) 4 MG tablet TAKE 0.5-1 TABLET AT BEDTIME AS NEEDED FOR MUSCLE SPASMS (FOR NECK PAIN AND TIGHTNESS). 90 tablet 1   traMADol (ULTRAM) 50 MG tablet Take 1 tablet (50 mg total) by mouth every 8 (eight) hours as needed. 270 tablet 0   triamcinolone cream (KENALOG) 0.1 % Apply 1 Application topically 2 (two) times daily. 80 g 1   umeclidinium-vilanterol (ANORO ELLIPTA) 62.5-25 MCG/ACT AEPB Inhale into the lungs.     No facility-administered medications prior to visit.    ROS Review of Systems  Constitutional:  Negative for diaphoresis and fatigue.  HENT:  Positive for trouble swallowing and voice change.  Respiratory:  Positive for shortness of breath. Negative for cough, chest tightness and wheezing.   Cardiovascular:  Negative for chest pain, palpitations and leg swelling.  Gastrointestinal:  Negative for abdominal pain, constipation and diarrhea.  Genitourinary: Negative.  Negative for difficulty urinating.  Musculoskeletal:  Positive for arthralgias. Negative for back pain and myalgias.  Skin:  Negative for color change.  Neurological: Negative.  Negative for dizziness.  Hematological:  Negative for adenopathy. Does not bruise/bleed easily.  Psychiatric/Behavioral: Negative.      Objective:  BP 114/70 (BP Location: Left Arm, Patient Position: Sitting, Cuff Size: Normal)   Pulse 67   Temp 98.4 F (36.9 C) (Oral)   Resp 16   Ht 6' (1.829 m)   Wt 187 lb 12.8 oz (85.2 kg)   SpO2 92%    BMI 25.47 kg/m   BP Readings from Last 3 Encounters:  01/22/23 114/70  01/11/23 118/62  12/18/22 (!) 126/54    Wt Readings from Last 3 Encounters:  01/22/23 187 lb 12.8 oz (85.2 kg)  01/11/23 185 lb (83.9 kg)  12/27/22 187 lb (84.8 kg)    Physical Exam Vitals reviewed.  Constitutional:      Appearance: He is not ill-appearing.  Eyes:     Conjunctiva/sclera: Conjunctivae normal.  Cardiovascular:     Rate and Rhythm: Normal rate and regular rhythm.     Heart sounds: Murmur heard.     Systolic murmur is present with a grade of 2/6.     No friction rub. No gallop.  Pulmonary:     Effort: Pulmonary effort is normal.     Breath sounds: No stridor. No wheezing, rhonchi or rales.  Abdominal:     General: Abdomen is flat. Bowel sounds are normal. There is abdominal bruit.     Palpations: There is no hepatomegaly, splenomegaly or mass.     Tenderness: There is no abdominal tenderness.  Musculoskeletal:        General: Normal range of motion.     Cervical back: Neck supple. No rigidity.     Right lower leg: No edema.     Left lower leg: No edema.  Skin:    General: Skin is warm and dry.     Findings: No rash.  Neurological:     General: No focal deficit present.     Mental Status: He is alert.  Psychiatric:        Mood and Affect: Mood normal.        Behavior: Behavior normal.     Lab Results  Component Value Date   WBC 8.2 01/11/2023   HGB 15.1 01/11/2023   HCT 46.0 01/11/2023   PLT 286.0 01/11/2023   GLUCOSE 94 01/11/2023   CHOL 148 07/05/2022   TRIG 242.0 (H) 07/05/2022   HDL 45.80 07/05/2022   LDLDIRECT 75.0 07/05/2022   LDLCALC 79 08/10/2021   ALT 16 07/05/2022   AST 16 07/05/2022   NA 141 01/11/2023   K 4.1 01/11/2023   CL 105 01/11/2023   CREATININE 1.19 01/11/2023   BUN 30 (H) 01/11/2023   CO2 28 01/11/2023   TSH 0.65 01/11/2023   PSA 0.75 01/11/2023   INR 0.96 07/28/2014   HGBA1C 5.8 07/05/2022    CT SHOULDER RIGHT W CONTRAST  Result Date:  01/02/2023 CLINICAL DATA:  Right shoulder pain, history of arthritis. EXAM: CT ARTHROGRAPHY OF THE RIGHT SHOULDER TECHNIQUE: Multidetector CT imaging was performed following the standard protocol after injection of dilute contrast into the joint. RADIATION  DOSE REDUCTION: This exam was performed according to the departmental dose-optimization program which includes automated exposure control, adjustment of the mA and/or kV according to patient size and/or use of iterative reconstruction technique. COMPARISON:  None Available. FINDINGS: Rotator cuff: Partial thickness bursal surface tear of the supraspinatus tendon 2.7 cm from the peripheral insertion. Infraspinatus tendon is intact. Teres minor tendon is intact. Subscapularis tendon is intact. Muscles: No muscle atrophy. No intramuscular fluid collection or hematoma. Biceps Long Head: Intraarticular and extraarticular portions of the biceps tendon are intact. Acromioclavicular Joint: Mild arthropathy of the acromioclavicular joint. No subacromial/subdeltoid bursal fluid. Glenohumeral Joint: Intraarticular contrast distending the joint capsule. Normal glenohumeral ligaments. Mild partial-thickness cartilage loss of the glenohumeral joint. Labrum: Intact. Bones: No fracture or dislocation. No aggressive osseous lesion. Other: No fluid collection or hematoma. 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 Mild thoracic aortic atherosclerosis. IMPRESSION: 1. Partial thickness bursal surface tear of the supraspinatus tendon 2.7 cm from the peripheral insertion. 2. Mild osteoarthritis of the glenohumeral joint. 3. 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 Recommend annual imaging followup by CTA or MRA. This recommendation follows 2010 ACCF/AHA/AATS/ACR/ASA/SCA/SCAI/SIR/STS/SVM Guidelines for the Diagnosis and  Management of Patients with Thoracic Aortic Disease. Circulation. 2010; 121: C623-J628. Aortic aneurysm NOS (ICD10-I71.9) 4.  Aortic Atherosclerosis (ICD10-I70.0). Electronically Signed   By: Elige Ko M.D.   On: 01/02/2023 06:50   DG FLUORO GUIDED NEEDLE PLC ASPIRATION/INJECTION LOC  Result Date: 12/22/2022 CLINICAL DATA:  Chronic right shoulder pain. EXAM: RIGHT SHOULDER INJECTION UNDER FLUOROSCOPY COMPARISON:  None Available. FLUOROSCOPY: Radiation Exposure Index (as provided by the fluoroscopic device): 0.4 mGy Kerma PROCEDURE: The risks and benefits of the procedure were discussed with the patient, and written informed consent was obtained. The patient stated no history of allergy to contrast media. A formal timeout procedure was performed with the patient according to departmental protocol. The patient was placed supine on the fluoroscopy table and the right glenohumeral joint was identified under fluoroscopy. The skin overlying the right glenohumeral joint was subsequently cleaned with Betadine and a sterile drape was placed over the area of interest. 2 ml 1% Lidocaine was used to anesthetize the skin around the needle insertion site. A 22 gauge spinal needle was inserted into the right glenohumeral joint under fluoroscopy. 12 ml of contrast mixture (10 ml of sterile saline and 10 ml of Isovue-M 200 contrast) were injected into the right glenohumeral joint. The needle was removed and hemostasis was achieved. The patient was subsequently transferred to CT for imaging. IMPRESSION: Technically successful right shoulder injection under fluoroscopy for CT. Electronically Signed   By: Obie Dredge M.D.   On: 12/22/2022 14:47    Assessment & Plan:   Benign prostatic hyperplasia without lower urinary tract symptoms- His PSA is down to 0.75.  Moderate aortic stenosis - He is asymptomatic with this.  Essential hypertension, benign - BP is well controlled.  Acquired hypothyroidism - He is  euthyroid.     Follow-up: No follow-ups on file.  Sanda Linger, MD

## 2023-01-24 ENCOUNTER — Ambulatory Visit (INDEPENDENT_AMBULATORY_CARE_PROVIDER_SITE_OTHER): Payer: Medicare Other

## 2023-01-24 DIAGNOSIS — M7918 Myalgia, other site: Secondary | ICD-10-CM | POA: Insufficient documentation

## 2023-01-24 DIAGNOSIS — I495 Sick sinus syndrome: Secondary | ICD-10-CM | POA: Diagnosis not present

## 2023-01-24 LAB — CUP PACEART REMOTE DEVICE CHECK
Battery Remaining Longevity: 124 mo
Battery Voltage: 3.02 V
Brady Statistic AP VP Percent: 0.08 %
Brady Statistic AP VS Percent: 17.95 %
Brady Statistic AS VP Percent: 0.07 %
Brady Statistic AS VS Percent: 81.9 %
Brady Statistic RA Percent Paced: 18.13 %
Brady Statistic RV Percent Paced: 0.15 %
Date Time Interrogation Session: 20240820221918
Implantable Lead Connection Status: 753985
Implantable Lead Connection Status: 753985
Implantable Lead Implant Date: 20200527
Implantable Lead Implant Date: 20200527
Implantable Lead Location: 753859
Implantable Lead Location: 753860
Implantable Lead Model: 5076
Implantable Lead Model: 5076
Implantable Pulse Generator Implant Date: 20200527
Lead Channel Impedance Value: 304 Ohm
Lead Channel Impedance Value: 361 Ohm
Lead Channel Impedance Value: 399 Ohm
Lead Channel Impedance Value: 551 Ohm
Lead Channel Pacing Threshold Amplitude: 0.5 V
Lead Channel Pacing Threshold Amplitude: 0.625 V
Lead Channel Pacing Threshold Pulse Width: 0.4 ms
Lead Channel Pacing Threshold Pulse Width: 0.4 ms
Lead Channel Sensing Intrinsic Amplitude: 3.125 mV
Lead Channel Sensing Intrinsic Amplitude: 3.125 mV
Lead Channel Sensing Intrinsic Amplitude: 4.875 mV
Lead Channel Sensing Intrinsic Amplitude: 4.875 mV
Lead Channel Setting Pacing Amplitude: 1.5 V
Lead Channel Setting Pacing Amplitude: 2.5 V
Lead Channel Setting Pacing Pulse Width: 0.4 ms
Lead Channel Setting Sensing Sensitivity: 2 mV
Zone Setting Status: 755011
Zone Setting Status: 755011

## 2023-01-25 ENCOUNTER — Other Ambulatory Visit: Payer: Self-pay | Admitting: Internal Medicine

## 2023-01-25 DIAGNOSIS — N400 Enlarged prostate without lower urinary tract symptoms: Secondary | ICD-10-CM

## 2023-01-25 DIAGNOSIS — E785 Hyperlipidemia, unspecified: Secondary | ICD-10-CM

## 2023-01-30 NOTE — Progress Notes (Signed)
Subjective:    Patient ID: [name] Glass, male    DOB: Sep 19, 1940, 82 y.o.   MRN: [id number]  HPI  [name] Glass is a 82 y.o. 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   They are presenting to PM&R clinic for follow up related to bilateral neck and shoulder pain, multifactorial, for which he gets monthly TPI injections for myofascial pain .  Interval Hx:  - Medications: Feels tramadol TID makes him "dull" mentally, so he is only taking it at nighttime. It does help him sleep.   He feels the meloxicam is the most helpful; was prescribed by Dr. Everardo Pacific, was just refilled, and he states this is the only thing that keeps him functional.    - Other concerns: Cramping pain in his neck and shoulders, especially with shopping or walking, is completely debilitating at this point. TPIs help for about 2 weeks but do not prevent it entirely and it wears off.   Family History  Problem Relation Age of Onset   Heart attack Father 4       lived to 75   Alzheimer's disease Mother    Stroke Mother    Breast cancer Mother    Alzheimer's disease Maternal Grandmother    Stroke Maternal Grandmother    Lung cancer Maternal Grandmother    Lung cancer Maternal Grandfather    Cancer Paternal Grandmother        type unknown   Diabetes Paternal Grandfather    Other Paternal Grandfather        Growth in the back of the head   Social History   Socioeconomic History   Marital status: Married    Spouse name: [name] Glass   Number of children: 0   Years of education: Not on file   Highest education  level: Not on file  Occupational History   Occupation: Retired   Tobacco Use   Smoking status: Never   Smokeless tobacco: Never  Vaping Use   Vaping status: Never Used  Substance and Sexual Activity   Alcohol use: Yes    Alcohol/week: 4.0 standard drinks of alcohol    Types: 4 Standard drinks or equivalent per week    Comment: 3 drinks at night    Drug use: No   Sexual activity: Not Currently  Other Topics Concern   Not on file  Social History Narrative   Not on file   Social Determinants of Health   Financial Resource Strain: Low Risk  (05/01/2022)   Overall Financial Resource Strain (CARDIA)    Difficulty of Paying Living Expenses: Not hard at all  Food Insecurity: No Food Insecurity (05/01/2022)   Hunger Vital Sign    Worried About Running Out of Food in the Last Year: Never true    Ran Out of Food in the Last Year: Never true  Transportation Needs: No Transportation Needs (05/01/2022)   PRAPARE - Administrator, Civil Service (Medical): No    Lack of Transportation (Non-Medical): No  Physical Activity: Sufficiently Active (05/01/2022)   Exercise Vital Sign    Days of Exercise per Week: 5 days    Minutes of Exercise per Session: 30 min  Stress:  No Stress Concern Present (05/01/2022)   [employer] of Occupational Health - Occupational Stress Questionnaire    Feeling of Stress : Not at all  Social Connections: Moderately Integrated (05/01/2022)   Social Connection and Isolation Panel [NHANES]    Frequency of Communication with Friends and Family: More than three times a week    Frequency of Social Gatherings with Friends and Family: More than three times a week    Attends Religious Services: Never    Database administrator or Organizations: Yes    Attends Banker Meetings: More than 4 times per year    Marital Status: Married   Past Surgical History:  Procedure Laterality Date   CARDIAC CATHETERIZATION Right 2008   5 heart stents    CATARACT EXTRACTION Bilateral 6 years ago   CYSTOSCOPY N/A 08/06/2012   Procedure: CYSTOSCOPY;  Surgeon: Anner Crete, MD;  Location: [facility];  Service: Urology;  Laterality: N/A;   ESOPHAGOGASTRIC FUNDOPLICATION  1985   spleen removed, 5 pints of blood given   EYE SURGERY     HERNIA REPAIR  1985   x4   INGUINAL HERNIA REPAIR  1992   PACEMAKER IMPLANT N/A 10/30/2018   Procedure: PACEMAKER IMPLANT;  Surgeon: Regan Lemming, MD;  Location: [facility];  Service: Cardiovascular;  Laterality: N/A;   PACEMAKER INSERTION     PPM GENERATOR REMOVAL N/A 10/25/2018   Procedure: PPM GENERATOR REMOVAL;  Surgeon: Regan Lemming, MD;  Location: [facility];  Service: Cardiovascular;  Laterality: N/A;   PROSTATE ABLATION  2013   "laser ablation"   RETINAL DETACHMENT SURGERY Right 01/24/2019   Pneumatic cryopexy - Dr. Rennis Chris   TEE WITHOUT CARDIOVERSION N/A 10/25/2018   Procedure: TRANSESOPHAGEAL ECHOCARDIOGRAM (TEE);  Surgeon: Elease Hashimoto Deloris Ping, MD;  Location: [facility];  Service: Cardiovascular;  Laterality: N/A;   TEMPORARY PACEMAKER N/A 10/25/2018   Procedure: TEMPORARY PACEMAKER;  Surgeon: Regan Lemming, MD;  Location: [facility];  Service: Cardiovascular;  Laterality: N/A;   TONSILLECTOMY  as child   TOTAL KNEE ARTHROPLASTY Right 08/03/2014   Procedure: TOTAL RIGHT KNEE ARTHROPLASTY;  Surgeon: Loanne Drilling, MD;  Location: [facility];  Service: Orthopedics;  Laterality: Right;   TRANSURETHRAL RESECTION OF PROSTATE N/A 08/06/2012   Procedure: Elenor Quinones OF PROSTATE WITH GYRUS ;  Surgeon: Anner Crete, MD;  Location: [facility];  Service: Urology;  Laterality: N/A;   UMBILICAL HERNIA REPAIR  1990   UVULECTOMY  2005   for sleep apnea   UVULOPALATOPHARYNGOPLASTY  6 years ago   Past Surgical History:  Procedure Laterality Date   CARDIAC CATHETERIZATION Right 2008   5 heart stents   CATARACT EXTRACTION Bilateral 6 years ago   CYSTOSCOPY N/A 08/06/2012   Procedure:  CYSTOSCOPY;  Surgeon: Anner Crete, MD;  Location: [facility];  Service: Urology;  Laterality: N/A;   ESOPHAGOGASTRIC FUNDOPLICATION  1985   spleen removed, 5 pints of blood given   EYE SURGERY     HERNIA REPAIR  1985   x4   INGUINAL HERNIA REPAIR  1992   PACEMAKER IMPLANT N/A 10/30/2018   Procedure: PACEMAKER IMPLANT;  Surgeon: Regan Lemming, MD;  [facility]: [facility];  Service: Cardiovascular;  Laterality: N/A;   PACEMAKER INSERTION     PPM GENERATOR REMOVAL N/A 10/25/2018   Procedure: PPM GENERATOR REMOVAL;  Surgeon: Regan Lemming, MD;  Location: [facility];  Service: Cardiovascular;  Laterality: N/A;  PROSTATE ABLATION  2013   "laser ablation"   RETINAL DETACHMENT SURGERY Right 01/24/2019   Pneumatic cryopexy - Dr. Rennis Chris   TEE WITHOUT CARDIOVERSION N/A 10/25/2018   Procedure: TRANSESOPHAGEAL ECHOCARDIOGRAM (TEE);  Surgeon: Elease Hashimoto Deloris Ping, MD;  Location: [facility];  Service: Cardiovascular;  Laterality: N/A;   TEMPORARY PACEMAKER N/A 10/25/2018   Procedure: TEMPORARY PACEMAKER;  Surgeon: Regan Lemming, MD;  [facility]: [facility];  Service: Cardiovascular;  Laterality: N/A;   TONSILLECTOMY  as child   TOTAL KNEE ARTHROPLASTY Right 08/03/2014   Procedure: TOTAL RIGHT KNEE ARTHROPLASTY;  Surgeon: Loanne Drilling, MD;  Location: [facility];  Service: Orthopedics;  Laterality: Right;   TRANSURETHRAL RESECTION OF PROSTATE N/A 08/06/2012   Procedure: Elenor Quinones OF PROSTATE WITH GYRUS ;  Surgeon: Anner Crete, MD;  Location: [facility];  Service: Urology;  Laterality: N/A;   UMBILICAL HERNIA REPAIR  1990   UVULECTOMY  2005   for sleep apnea   UVULOPALATOPHARYNGOPLASTY  6 years ago   Past Medical History:  Diagnosis Date   Arthritis    Basal cell carcinoma 02/17/2020   infl & ulcerated-Left forehead ([facility])   Coronary artery disease    GERD (gastroesophageal reflux disease)    H/O bronchitis    H/O hiatal hernia    History of kidney stones last  in 1976   Hyperlipidemia    statin intolerant, under control   Hypertension    under control   Hypertensive retinopathy    OU   Hypothyroidism    Moderate persistent asthma without complication 06/09/2019   Retinal detachment    OD   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   left helix tx [id number]   Sleep apnea    hx of, surgery to reverse   There were no vitals taken for this visit.  Opioid Risk Score:   Fall Risk Score:  `1  Depression screen [facility] 2/9     01/22/2023    3:48 PM 12/27/2022    2:49 PM 12/18/2022    2:02 PM 10/18/2022   12:59 PM 09/20/2022    1:01 PM 08/30/2022    1:39 PM 07/26/2022   12:53 PM  Depression screen [facility] 2/9  Decreased Interest 0 0 0 0 0 0 0  Down, Depressed, Hopeless 0 0 0 0 0 0 0  PHQ - 2 Score 0 0 0 0 0 0 0  Altered sleeping 0        Tired, decreased energy 0        Change in appetite 0        Feeling bad or failure about yourself  0        Trouble concentrating 0        Moving slowly or fidgety/restless 0        Suicidal thoughts 0        PHQ-9 Score 0        Difficult doing work/chores Not difficult at all          Review of Systems     Objective:   Physical Exam   PE: Constitution: Appropriate appearance for age. No apparent distress  Resp: No respiratory distress. No accessory muscle usage. on RA and CTAB Cardio: Well perfused appearance. No peripheral edema. Abdomen: Nondistended. Nontender.   Psych: Appropriate mood and affect. Neuro: AAOx4. No apparent cognitive deficits   Neurologic Exam:   DTRs: Reflexes were 2+ in bilateral achilles, patella, biceps, BR and triceps. Hoffmans: negative  b/l Sensory exam: revealed normal sensation in all dermatomal regions  Motor exam: strength 5/5 throughout bilateral upper extremities, bilateral lower extremities Coordination: Fine motor coordination was normal.   Gait: normal   MSK: Neck Atrophy along R trapezius - scar over scapular spine from prior surgery + TTP b/l  cervical paraspinals, R occiput, R trapezius, R levator scapulae, and R infraspinatus Extremely limited ROM in neck extension, flexion, and rotation - improved from initial eval in rotation + Facet loading pain  ------------------------------------------------------------ PROCEDURE: Bilateral trigger point injections   Diagnosis:  Myofascial neck pain   Goals with treatment: [ x ] Decrease pain [ x ] Improve Active / Passive ROM [ x ] Improve ADLs [  ] Improve functional mobility   MEDICATION:  [ x ] Kenalog 40 mg/mL  [ X ] Lidocaine 1%      CONSENT: Obtained in writing per policy. Consent uploaded to chart.   Benefits discussed.  Risks discussed included, but were not limited to, pain and discomfort, bleeding, bruising, allergic reaction, infection. All questions answered to patient/family member/guardian/ caregiver satisfaction. They would like to proceed with procedure. There are no noted contraindications to procedure.   PROCEDURE Time out was preformed No heat sources No antibiotics   The patient was explained about both the benefits and risks of a Right   occipital nerve block and TPI . After the patient acknowledged an understanding of the risks and benefits, the patient agreed to proceed. The area was first marked and then prepped in an aseptic fashion with betadine / alcohol. A 30 g, 1/2 inch needle was directed via a posterior approach into the b/l cervical paraspinals, R occiput, R trapezius, R levator scapulae, and R infraspinatus. The injection was completed with Kenalog 40 mg/ml 0.2 cc mixed with 6 cc of 1% lidocaine after no blood was aspirated on pull back.   No complications were encountered. The patient tolerated the procedure well.     Assessment & Plan:   ONAS TRAXLER is a 82 y.o. 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   They are presenting to PM&R clinic for follow up related to bilateral neck and shoulder pain, multifactorial, for which he gets monthly TPI injections for myofascial pain .  Myofascial neck pain -     Lidocaine HCl -     Triamcinolone Acetonide  Recommend avoiding meloxicam if possible d/t reduced GFR, but given patient is dysfunctional without it, advised he discuss it further with prescribed Dr. Barbaraann Cao  Tramadol at bedtime through PCP  Q1M TPI as long as benefitting; performed today as above  Degenerative disc disease, cervical Myalgia, shoulder region -     Ambulatory referral to Neurosurgery Reviewed with patient CT neck 2022 which showed C2-C3 and C4-C5  fusion across the disc spaces and facet joint ankylosis; very likely worsening arthritis contributing to progressive neck pain and loss of ROM.  Patient at this point has done full course of PT, received appropriate medication treatments, and done all reasonable conservative interventions for his pain, with ongoing worsening pain and loss of quality of life. After discussion of risks vs benefits, he agreed to neurosurgical referral for MBB vs. Surgical intervention.

## 2023-01-31 ENCOUNTER — Encounter: Payer: Self-pay | Admitting: Physical Medicine and Rehabilitation

## 2023-01-31 ENCOUNTER — Encounter
Payer: Medicare Other | Attending: Physical Medicine and Rehabilitation | Admitting: Physical Medicine and Rehabilitation

## 2023-01-31 VITALS — BP 121/69 | HR 96 | Ht 72.0 in | Wt 187.0 lb

## 2023-01-31 DIAGNOSIS — M7918 Myalgia, other site: Secondary | ICD-10-CM | POA: Diagnosis not present

## 2023-01-31 DIAGNOSIS — M5030 Other cervical disc degeneration, unspecified cervical region: Secondary | ICD-10-CM | POA: Diagnosis not present

## 2023-01-31 DIAGNOSIS — M542 Cervicalgia: Secondary | ICD-10-CM

## 2023-01-31 MED ORDER — LIDOCAINE HCL 1 % IJ SOLN
6.0000 mL | Freq: Once | INTRAMUSCULAR | Status: AC
Start: 2023-01-31 — End: 2023-01-31
  Administered 2023-01-31: 6 mL via INTRADERMAL

## 2023-01-31 MED ORDER — TRIAMCINOLONE ACETONIDE 40 MG/ML IJ SUSP
5.0000 mg | Freq: Once | INTRAMUSCULAR | Status: AC
Start: 2023-01-31 — End: 2023-01-31
  Administered 2023-01-31: 5.2 mg via INTRAMUSCULAR

## 2023-02-07 NOTE — Progress Notes (Signed)
Remote pacemaker transmission.   

## 2023-02-08 ENCOUNTER — Other Ambulatory Visit: Payer: Self-pay | Admitting: Physical Medicine and Rehabilitation

## 2023-02-08 DIAGNOSIS — M62838 Other muscle spasm: Secondary | ICD-10-CM

## 2023-02-13 ENCOUNTER — Other Ambulatory Visit: Payer: Self-pay | Admitting: Internal Medicine

## 2023-02-13 DIAGNOSIS — I10 Essential (primary) hypertension: Secondary | ICD-10-CM

## 2023-02-13 DIAGNOSIS — I2510 Atherosclerotic heart disease of native coronary artery without angina pectoris: Secondary | ICD-10-CM

## 2023-02-15 ENCOUNTER — Other Ambulatory Visit: Payer: Self-pay | Admitting: Internal Medicine

## 2023-02-15 DIAGNOSIS — Z23 Encounter for immunization: Secondary | ICD-10-CM | POA: Diagnosis not present

## 2023-02-15 DIAGNOSIS — I2510 Atherosclerotic heart disease of native coronary artery without angina pectoris: Secondary | ICD-10-CM

## 2023-02-15 DIAGNOSIS — I10 Essential (primary) hypertension: Secondary | ICD-10-CM

## 2023-02-20 ENCOUNTER — Ambulatory Visit (HOSPITAL_COMMUNITY): Payer: Medicare Other

## 2023-02-20 ENCOUNTER — Encounter (HOSPITAL_COMMUNITY): Payer: Self-pay

## 2023-02-22 DIAGNOSIS — M25512 Pain in left shoulder: Secondary | ICD-10-CM | POA: Diagnosis not present

## 2023-02-22 DIAGNOSIS — M25511 Pain in right shoulder: Secondary | ICD-10-CM | POA: Diagnosis not present

## 2023-02-28 DIAGNOSIS — Z85820 Personal history of malignant melanoma of skin: Secondary | ICD-10-CM | POA: Diagnosis not present

## 2023-02-28 DIAGNOSIS — D485 Neoplasm of uncertain behavior of skin: Secondary | ICD-10-CM | POA: Diagnosis not present

## 2023-02-28 DIAGNOSIS — L720 Epidermal cyst: Secondary | ICD-10-CM | POA: Diagnosis not present

## 2023-03-02 ENCOUNTER — Other Ambulatory Visit: Payer: Self-pay | Admitting: Internal Medicine

## 2023-03-06 ENCOUNTER — Other Ambulatory Visit: Payer: Self-pay | Admitting: Pulmonary Disease

## 2023-03-06 DIAGNOSIS — J449 Chronic obstructive pulmonary disease, unspecified: Secondary | ICD-10-CM

## 2023-03-07 ENCOUNTER — Encounter
Payer: Medicare Other | Attending: Physical Medicine and Rehabilitation | Admitting: Physical Medicine and Rehabilitation

## 2023-03-07 VITALS — BP 121/69 | HR 60 | Ht 72.0 in | Wt 187.0 lb

## 2023-03-07 DIAGNOSIS — M5030 Other cervical disc degeneration, unspecified cervical region: Secondary | ICD-10-CM | POA: Diagnosis not present

## 2023-03-07 DIAGNOSIS — G44229 Chronic tension-type headache, not intractable: Secondary | ICD-10-CM | POA: Insufficient documentation

## 2023-03-07 DIAGNOSIS — M542 Cervicalgia: Secondary | ICD-10-CM | POA: Insufficient documentation

## 2023-03-07 DIAGNOSIS — M62838 Other muscle spasm: Secondary | ICD-10-CM | POA: Insufficient documentation

## 2023-03-07 DIAGNOSIS — M5481 Occipital neuralgia: Secondary | ICD-10-CM | POA: Insufficient documentation

## 2023-03-07 MED ORDER — LIDOCAINE HCL 1 % IJ SOLN
3.0000 mL | Freq: Once | INTRAMUSCULAR | Status: AC
Start: 2023-03-07 — End: 2023-03-07
  Administered 2023-03-07: 3 mL

## 2023-03-07 MED ORDER — TRIAMCINOLONE ACETONIDE 40 MG/ML IJ SUSP
1.2000 mg | Freq: Once | INTRAMUSCULAR | Status: AC
Start: 2023-03-07 — End: 2023-03-07
  Administered 2023-03-07: 1.2 mg via INTRAMUSCULAR

## 2023-03-07 NOTE — Progress Notes (Signed)
HPI: [name] Glass is a 82 y.o. male with PMHx has Coronary artery disease; OA (osteoarthritis) of knee; Gastroesophageal reflux disease without esophagitis; Essential hypertension, benign; Hyperlipidemia with target LDL less than 70; Chronic renal disease, stage 3, moderately decreased glomerular filtration rate (GFR) between 30-59 mL/min/1.73 square meter (HCC); Seasonal allergic rhinitis due to pollen; Benign prostatic hyperplasia without lower urinary tract symptoms; Presence of permanent cardiac pacemaker; Sleep apnea; Hypothyroidism; Myofascial neck pain; Degenerative disc disease, cervical; Hypogonadism male; Moderate persistent asthma without complication; Chronic bronchitis, mucopurulent (HCC); Sinus node dysfunction (HCC); Thoracic aortic aneurysm (HCC); Moderate aortic stenosis; Chronic tension-type headache, not intractable; Postural kyphosis of cervicothoracic region; Cervico-occipital neuralgia of right side; Prediabetes; Muscle spasms of neck; Chronic pain syndrome; and Myalgia, shoulder region on their problem list. who presents to clinic for treatment of pain related to  myofascial pain and cervicalgia  via injection as described below.    No new concerns or complaints. No major changes in medical history since last visit.   Physical Exam:  General: Appropriate appearance for age.  Mental Status: Appropriate mood and affect.  Cardiovascular: RRR, no m/r/g.  Respiratory: CTAB, no rales/rhonchi/wheezing.  Skin: No apparent rashes or lesions.  Neuro: Awake, alert, and oriented x3. No apparent deficits.  MSK:  Moving all 4 limbs antigravity and against resistance. +Severe thoracic kyphosis and reduced cervical ROM + TTP with palpable trigger points in R cervical paraspinals,  R trapezius, R levator scapulae, and R infraspinatus.  PROCEDURE:  Right  trigger point injections Diagnosis:    ICD-10-CM   1. Myofascial neck pain  M54.2 lidocaine (XYLOCAINE) 1 % (with pres) injection 3 mL     triamcinolone acetonide (KENALOG-40) injection 1.2 mg    2. Muscle spasms of neck  M62.838     3. Chronic tension-type headache, not intractable  G44.229       Goals with treatment: [ x ] Decrease pain [ x ] Improve Active / Passive ROM [ x ] Improve ADLs [ x ] Improve functional mobility  MEDICATION:  [ x ] Kenalog 40 mg/mL  [ X ] Lidocaine 1%    CONSENT: Obtained in writing per policy. Consent uploaded to chart.  Benefits discussed.  Risks discussed included, but were not limited to, pain and discomfort, bleeding, bruising, allergic reaction, infection. All questions answered to patient/family member/guardian/ caregiver satisfaction. They would like to proceed with procedure. There are no noted contraindications to procedure.  PROCEDURE Time out was preformed No heat sources No antibiotics  The patient was explained about both the benefits and risks of a Right  trigger point injections. After the patient acknowledged an understanding of the risks and benefits, the patient agreed to proceed. The area was first marked and then prepped in an aseptic fashion with betadine / alcohol. A 30 g, 1/2 inch needle was directed via a direct approach into the Right cervical paraspinals,  R trapezius, R levator scapulae, and R infraspinatus. . The injection was completed with Kenalog 40 mg/ml 0.2 cc mixed with 6 cc of 1% lidocaine after no blood was aspirated on pull back.  No complications were encountered. The patient tolerated the procedure well.  Impression: HPI: [name] Glass is a 82 y.o. male with PMHx has Coronary artery disease; OA (osteoarthritis) of knee; Gastroesophageal reflux disease without esophagitis; Essential hypertension, benign; Hyperlipidemia with target LDL less than 70; Chronic renal disease, stage 3, moderately decreased glomerular filtration rate (GFR) between 30-59 mL/min/1.73 square meter (HCC); Seasonal allergic rhinitis due to  pollen; Benign prostatic hyperplasia  without lower urinary tract symptoms; Presence of permanent cardiac pacemaker; Sleep apnea; Hypothyroidism; Myofascial neck pain; Degenerative disc disease, cervical; Hypogonadism male; Moderate persistent asthma without complication; Chronic bronchitis, mucopurulent (HCC); Sinus node dysfunction (HCC); Thoracic aortic aneurysm (HCC); Moderate aortic stenosis; Chronic tension-type headache, not intractable; Postural kyphosis of cervicothoracic region; Cervico-occipital neuralgia of right side; Prediabetes; Muscle spasms of neck; Chronic pain syndrome; and Myalgia, shoulder region on their problem list. who presents to clinic for treatment of myofascial neck and R shoulder pain and cervicalgia. They received a  Right  trigger point injections as above.   PLAN: - Resume Usual Activities. Notify Physician of any unusual bleeding, erythema or concern for side effects as reviewed above. - Apply ice prn for pain - Tylenol prn for pain - Follow up in 1 month for repeat injections if benefitting; has neurosuregery referral pending  Patient/Care Giver was ready to learn without apparent learning barriers. Education was provided on diagnosis, treatment options/plan according to patient's preferred learning style. Patient/Care Giver verbalized understanding and agreement with the above plan.   Angelina Sheriff, DO 03/13/2023

## 2023-03-13 NOTE — Patient Instructions (Signed)
-   Resume Usual Activities. Notify Physician of any unusual bleeding, erythema or concern for side effects as reviewed above. - Apply ice prn for pain - Tylenol prn for pain - Follow up in 1 month for repeat injections if benefitting

## 2023-03-15 ENCOUNTER — Other Ambulatory Visit: Payer: Self-pay | Admitting: Internal Medicine

## 2023-03-15 DIAGNOSIS — E039 Hypothyroidism, unspecified: Secondary | ICD-10-CM

## 2023-03-22 ENCOUNTER — Other Ambulatory Visit: Payer: Self-pay | Admitting: Internal Medicine

## 2023-03-22 DIAGNOSIS — J4540 Moderate persistent asthma, uncomplicated: Secondary | ICD-10-CM

## 2023-04-02 DIAGNOSIS — Z6826 Body mass index (BMI) 26.0-26.9, adult: Secondary | ICD-10-CM | POA: Diagnosis not present

## 2023-04-02 DIAGNOSIS — M47812 Spondylosis without myelopathy or radiculopathy, cervical region: Secondary | ICD-10-CM | POA: Diagnosis not present

## 2023-04-04 ENCOUNTER — Encounter (HOSPITAL_BASED_OUTPATIENT_CLINIC_OR_DEPARTMENT_OTHER): Payer: Medicare Other | Admitting: Physical Medicine and Rehabilitation

## 2023-04-04 ENCOUNTER — Encounter: Payer: Self-pay | Admitting: Physical Medicine and Rehabilitation

## 2023-04-04 VITALS — BP 116/61 | HR 82 | Ht 72.0 in | Wt 195.0 lb

## 2023-04-04 DIAGNOSIS — M5030 Other cervical disc degeneration, unspecified cervical region: Secondary | ICD-10-CM | POA: Diagnosis not present

## 2023-04-04 DIAGNOSIS — G44229 Chronic tension-type headache, not intractable: Secondary | ICD-10-CM | POA: Diagnosis not present

## 2023-04-04 DIAGNOSIS — M542 Cervicalgia: Secondary | ICD-10-CM | POA: Diagnosis not present

## 2023-04-04 DIAGNOSIS — M5481 Occipital neuralgia: Secondary | ICD-10-CM | POA: Diagnosis not present

## 2023-04-04 DIAGNOSIS — M62838 Other muscle spasm: Secondary | ICD-10-CM | POA: Diagnosis not present

## 2023-04-04 MED ORDER — TRIAMCINOLONE ACETONIDE 40 MG/ML IJ SUSP
5.0000 mg | Freq: Once | INTRAMUSCULAR | Status: AC
Start: 2023-04-04 — End: 2023-04-04
  Administered 2023-04-04: 5.2 mg via INTRAMUSCULAR

## 2023-04-04 MED ORDER — LIDOCAINE HCL 1 % IJ SOLN
6.0000 mL | Freq: Once | INTRAMUSCULAR | Status: AC
Start: 2023-04-04 — End: 2023-04-04
  Administered 2023-04-04: 6 mL

## 2023-04-04 NOTE — Progress Notes (Signed)
HPI: [name] Glass is a 82 y.o. male with PMHx has Coronary artery disease; OA (osteoarthritis) of knee; Gastroesophageal reflux disease without esophagitis; Essential hypertension, benign; Hyperlipidemia with target LDL less than 70; Chronic renal disease, stage 3, moderately decreased glomerular filtration rate (GFR) between 30-59 mL/min/1.73 square meter (HCC); Seasonal allergic rhinitis due to pollen; Benign prostatic hyperplasia without lower urinary tract symptoms; Presence of permanent cardiac pacemaker; Sleep apnea; Hypothyroidism; Myofascial neck pain; Degenerative disc disease, cervical; Hypogonadism male; Moderate persistent asthma without complication; Chronic bronchitis, mucopurulent (HCC); Sinus node dysfunction (HCC); Thoracic aortic aneurysm (HCC); Moderate aortic stenosis; Chronic tension-type headache, not intractable; Postural kyphosis of cervicothoracic region; Cervico-occipital neuralgia of right side; Prediabetes; Muscle spasms of neck; Chronic pain syndrome; and Myalgia, shoulder region on their problem list. who presents to clinic for treatment of pain related to  myofascial pain and cervicalgia  via injection as described below.    No new concerns or complaints. No major changes in medical history since last visit. Saw Dr. Jake Samples 10/28, "the good news is I'm not getting an operation, the bad news is it's because I'm too old." He has 3 fused cervical vertebrae. He is getting a spinal injection through their office soon, unsure what.   Physical Exam:  General: Appropriate appearance for age.  Mental Status: Appropriate mood and affect.  Cardiovascular: RRR, no m/r/g.  Respiratory: CTAB, no rales/rhonchi/wheezing.  Skin: No apparent rashes or lesions.  Neuro: Awake, alert, and oriented x3. No apparent deficits.  MSK:  Moving all 4 limbs antigravity and against resistance. +Severe thoracic kyphosis and reduced cervical ROM + TTP with palpable trigger points in R cervical  paraspinals,  R trapezius, R levator scapulae, and R infraspinatus. +radiating headache with palpation of GON on R, none with LON.    PROCEDURE:  Right  trigger point injections and right greater occipital nerve block Diagnosis:    ICD-10-CM   1. Myofascial neck pain  M54.2     2. Cervico-occipital neuralgia of right side  M54.81     3. Degenerative disc disease, cervical  M50.30        Goals with treatment: [ x ] Decrease pain [ x ] Improve Active / Passive ROM [ x ] Improve ADLs [ x ] Improve functional mobility  MEDICATION:  [ x ] Kenalog 40 mg/mL  [ X ] Lidocaine 1%    CONSENT: Obtained in writing per policy. Consent uploaded to chart.  Benefits discussed.  Risks discussed included, but were not limited to, pain and discomfort, bleeding, bruising, allergic reaction, infection. All questions answered to patient/family member/guardian/ caregiver satisfaction. They would like to proceed with procedure. There are no noted contraindications to procedure.  PROCEDURE Time out was preformed No heat sources No antibiotics  The patient was explained about both the benefits and risks of a Right  trigger point injections. After the patient acknowledged an understanding of the risks and benefits, the patient agreed to proceed. The area was first marked and then prepped in an aseptic fashion with betadine / alcohol. A 30 g, 1/2 inch needle was directed via a direct approach into the right occiput 1/3 lateral to the midline and medial to the occipital artery, then directed to the Right cervical paraspinals,  R trapezius, R levator scapulae, and R infraspinatus. . The injection was completed with Kenalog 40 mg/ml 0.2 cc mixed with 6 cc of 1% lidocaine after no blood was aspirated on pull back.  No complications were encountered. The patient tolerated the  procedure well.  Impression: HPI: [name] Glass is a 82 y.o. male with PMHx has Coronary artery disease; OA (osteoarthritis) of knee;  Gastroesophageal reflux disease without esophagitis; Essential hypertension, benign; Hyperlipidemia with target LDL less than 70; Chronic renal disease, stage 3, moderately decreased glomerular filtration rate (GFR) between 30-59 mL/min/1.73 square meter (HCC); Seasonal allergic rhinitis due to pollen; Benign prostatic hyperplasia without lower urinary tract symptoms; Presence of permanent cardiac pacemaker; Sleep apnea; Hypothyroidism; Myofascial neck pain; Degenerative disc disease, cervical; Hypogonadism male; Moderate persistent asthma without complication; Chronic bronchitis, mucopurulent (HCC); Sinus node dysfunction (HCC); Thoracic aortic aneurysm (HCC); Moderate aortic stenosis; Chronic tension-type headache, not intractable; Postural kyphosis of cervicothoracic region; Cervico-occipital neuralgia of right side; Prediabetes; Muscle spasms of neck; Chronic pain syndrome; and Myalgia, shoulder region on their problem list. who presents to clinic for treatment of myofascial neck and R shoulder pain and cervicalgia. They received a  Right  trigger point injections as above.   PLAN: - Resume Usual Activities. Notify Physician of any unusual bleeding, erythema or concern for side effects as reviewed above. - Apply ice prn for pain - Tylenol prn for pain - Follow up in 1 month for repeat injections if benefitting; has neurosuregery referral pending  Patient/Care Giver was ready to learn without apparent learning barriers. Education was provided on diagnosis, treatment options/plan according to patient's preferred learning style. Patient/Care Giver verbalized understanding and agreement with the above plan.   Angelina Sheriff, DO 04/04/2023

## 2023-04-13 ENCOUNTER — Other Ambulatory Visit: Payer: Self-pay | Admitting: Physical Medicine and Rehabilitation

## 2023-04-13 DIAGNOSIS — M79644 Pain in right finger(s): Secondary | ICD-10-CM

## 2023-04-25 ENCOUNTER — Ambulatory Visit (INDEPENDENT_AMBULATORY_CARE_PROVIDER_SITE_OTHER): Payer: Medicare Other

## 2023-04-25 DIAGNOSIS — I495 Sick sinus syndrome: Secondary | ICD-10-CM | POA: Diagnosis not present

## 2023-04-25 LAB — CUP PACEART REMOTE DEVICE CHECK
Battery Remaining Longevity: 122 mo
Battery Voltage: 3.01 V
Brady Statistic AP VP Percent: 0.41 %
Brady Statistic AP VS Percent: 17.11 %
Brady Statistic AS VP Percent: 0.52 %
Brady Statistic AS VS Percent: 81.97 %
Brady Statistic RA Percent Paced: 17.63 %
Brady Statistic RV Percent Paced: 0.92 %
Date Time Interrogation Session: 20241119203907
Implantable Lead Connection Status: 753985
Implantable Lead Connection Status: 753985
Implantable Lead Implant Date: 20200527
Implantable Lead Implant Date: 20200527
Implantable Lead Location: 753859
Implantable Lead Location: 753860
Implantable Lead Model: 5076
Implantable Lead Model: 5076
Implantable Pulse Generator Implant Date: 20200527
Lead Channel Impedance Value: 342 Ohm
Lead Channel Impedance Value: 380 Ohm
Lead Channel Impedance Value: 437 Ohm
Lead Channel Impedance Value: 570 Ohm
Lead Channel Pacing Threshold Amplitude: 0.5 V
Lead Channel Pacing Threshold Amplitude: 0.75 V
Lead Channel Pacing Threshold Pulse Width: 0.4 ms
Lead Channel Pacing Threshold Pulse Width: 0.4 ms
Lead Channel Sensing Intrinsic Amplitude: 3.375 mV
Lead Channel Sensing Intrinsic Amplitude: 3.375 mV
Lead Channel Sensing Intrinsic Amplitude: 8.875 mV
Lead Channel Sensing Intrinsic Amplitude: 8.875 mV
Lead Channel Setting Pacing Amplitude: 1.5 V
Lead Channel Setting Pacing Amplitude: 2.5 V
Lead Channel Setting Pacing Pulse Width: 0.4 ms
Lead Channel Setting Sensing Sensitivity: 2 mV
Zone Setting Status: 755011
Zone Setting Status: 755011

## 2023-04-27 ENCOUNTER — Other Ambulatory Visit: Payer: Self-pay | Admitting: Internal Medicine

## 2023-04-27 DIAGNOSIS — E039 Hypothyroidism, unspecified: Secondary | ICD-10-CM

## 2023-05-08 ENCOUNTER — Other Ambulatory Visit: Payer: Self-pay | Admitting: Internal Medicine

## 2023-05-08 DIAGNOSIS — H6504 Acute serous otitis media, recurrent, right ear: Secondary | ICD-10-CM

## 2023-05-08 DIAGNOSIS — H6991 Unspecified Eustachian tube disorder, right ear: Secondary | ICD-10-CM

## 2023-05-09 ENCOUNTER — Encounter: Payer: Self-pay | Admitting: Physical Medicine and Rehabilitation

## 2023-05-09 ENCOUNTER — Encounter
Payer: Medicare Other | Attending: Physical Medicine and Rehabilitation | Admitting: Physical Medicine and Rehabilitation

## 2023-05-09 VITALS — BP 126/74 | HR 74 | Wt 198.8 lb

## 2023-05-09 DIAGNOSIS — G894 Chronic pain syndrome: Secondary | ICD-10-CM

## 2023-05-09 DIAGNOSIS — M25512 Pain in left shoulder: Secondary | ICD-10-CM | POA: Diagnosis not present

## 2023-05-09 DIAGNOSIS — N400 Enlarged prostate without lower urinary tract symptoms: Secondary | ICD-10-CM | POA: Diagnosis not present

## 2023-05-09 DIAGNOSIS — G4730 Sleep apnea, unspecified: Secondary | ICD-10-CM | POA: Insufficient documentation

## 2023-05-09 DIAGNOSIS — R7303 Prediabetes: Secondary | ICD-10-CM | POA: Insufficient documentation

## 2023-05-09 DIAGNOSIS — E039 Hypothyroidism, unspecified: Secondary | ICD-10-CM | POA: Insufficient documentation

## 2023-05-09 DIAGNOSIS — I2510 Atherosclerotic heart disease of native coronary artery without angina pectoris: Secondary | ICD-10-CM | POA: Insufficient documentation

## 2023-05-09 DIAGNOSIS — K219 Gastro-esophageal reflux disease without esophagitis: Secondary | ICD-10-CM | POA: Diagnosis not present

## 2023-05-09 DIAGNOSIS — Z950 Presence of cardiac pacemaker: Secondary | ICD-10-CM | POA: Insufficient documentation

## 2023-05-09 DIAGNOSIS — I712 Thoracic aortic aneurysm, without rupture, unspecified: Secondary | ICD-10-CM | POA: Diagnosis not present

## 2023-05-09 DIAGNOSIS — E785 Hyperlipidemia, unspecified: Secondary | ICD-10-CM | POA: Insufficient documentation

## 2023-05-09 DIAGNOSIS — M542 Cervicalgia: Secondary | ICD-10-CM

## 2023-05-09 DIAGNOSIS — I350 Nonrheumatic aortic (valve) stenosis: Secondary | ICD-10-CM | POA: Insufficient documentation

## 2023-05-09 DIAGNOSIS — N183 Chronic kidney disease, stage 3 unspecified: Secondary | ICD-10-CM | POA: Insufficient documentation

## 2023-05-09 DIAGNOSIS — M7918 Myalgia, other site: Secondary | ICD-10-CM | POA: Insufficient documentation

## 2023-05-09 DIAGNOSIS — J4489 Other specified chronic obstructive pulmonary disease: Secondary | ICD-10-CM | POA: Insufficient documentation

## 2023-05-09 DIAGNOSIS — M5481 Occipital neuralgia: Secondary | ICD-10-CM

## 2023-05-09 DIAGNOSIS — I129 Hypertensive chronic kidney disease with stage 1 through stage 4 chronic kidney disease, or unspecified chronic kidney disease: Secondary | ICD-10-CM | POA: Insufficient documentation

## 2023-05-09 MED ORDER — LIDOCAINE HCL 1 % IJ SOLN
6.0000 mL | Freq: Once | INTRAMUSCULAR | Status: AC
Start: 2023-05-09 — End: 2023-05-09
  Administered 2023-05-09: 6 mL

## 2023-05-09 MED ORDER — TRIAMCINOLONE ACETONIDE 40 MG/ML IJ SUSP
1.6000 mg | Freq: Once | INTRAMUSCULAR | Status: AC
Start: 2023-05-09 — End: 2023-05-09
  Administered 2023-05-09: 1.6 mg

## 2023-05-09 NOTE — Patient Instructions (Signed)
-   Resume Usual Activities. Notify Physician of any unusual bleeding, erythema or concern for side effects as reviewed above. - Apply ice prn for pain - Tylenol prn for pain - Follow up in 1 month for repeat injections if benefitting - Not a surgical candicate per evaluation by Dr. Jake Samples; will continue conservative management - Advised conservative management of L bicepital tendonitis; voltaren gel and aspercreme patches PRN

## 2023-05-09 NOTE — Progress Notes (Signed)
HPI: [name] [name] is a 82 y.o. male with PMHx has Coronary artery disease; OA (osteoarthritis) of knee; Gastroesophageal reflux disease without esophagitis; Essential hypertension, benign; Hyperlipidemia with target LDL less than 70; Chronic renal disease, stage 3, moderately decreased glomerular filtration rate (GFR) between 30-59 mL/min/1.73 square meter (HCC); Seasonal allergic rhinitis due to pollen; Benign prostatic hyperplasia without lower urinary tract symptoms; Presence of permanent cardiac pacemaker; Sleep apnea; Hypothyroidism; Myofascial neck pain; Degenerative disc disease, cervical; Hypogonadism male; Moderate persistent asthma without complication; Chronic bronchitis, mucopurulent (HCC); Sinus node dysfunction (HCC); Thoracic aortic aneurysm (HCC); Moderate aortic stenosis; Chronic tension-type headache, not intractable; Postural kyphosis of cervicothoracic region; Cervico-occipital neuralgia of right side; Prediabetes; Muscle spasms of neck; Chronic pain syndrome; and Myalgia, shoulder region on their problem list. who presents to clinic for treatment of pain related to  myofascial pain and cervicalgia  via injection as described below.   No new concerns or complaints. L shoulder is starting to hurt more recently, even with meloxicam. Last renal function showed mildly reduced GFR 01/2023; meloxicam was increased by his surgeon in October.   Physical Exam:  [name]: Appropriate appearance for age.  Mental Status: Appropriate mood and affect.  Cardiovascular: RRR, no m/r/g.  Respiratory: CTAB, no rales/rhonchi/wheezing.  Skin: No apparent rashes or lesions.  Neuro: Awake, alert, and oriented x3. No apparent deficits.  MSK:  Moving all 4 limbs antigravity and against resistance.  + TTP with palpable trigger points in bilateral cervical paraspinals,  R trapezius, R levator scapulae, and R infraspinatus. +radiating headache with palpation of GON on R; none on left  + LUE bicipital  groove pain with Yeargason's, + TTP L bicipital groove  PROCEDURE:  Right  trigger point injections and right greater occipital nerve block Diagnosis:  No diagnosis found.    Goals with treatment: [ x ] Decrease pain [ x ] Improve Active / Passive ROM [ x ] Improve ADLs [ x ] Improve functional mobility  MEDICATION:  [ x ] Kenalog 40 mg/mL  [ X ] Lidocaine 1%    CONSENT: Obtained in writing per policy. Consent uploaded to chart.  Benefits discussed.  Risks discussed included, but were not limited to, pain and discomfort, bleeding, bruising, allergic reaction, infection. All questions answered to patient/family member/guardian/ caregiver satisfaction. They would like to proceed with procedure. There are no noted contraindications to procedure.  PROCEDURE Time out was preformed No heat sources No antibiotics  The patient was explained about both the benefits and risks of a Right  trigger point injections. After the patient acknowledged an understanding of the risks and benefits, the patient agreed to proceed. The area was first marked and then prepped in an aseptic fashion with betadine / alcohol. A 30 g, 1/2 inch needle was directed via a direct approach into the right occiput 1/3 lateral to the midline and medial to the occipital artery, then directed to the bilateral cervical paraspinals,  R trapezius, R levator scapulae, R infraspinatus, and L biceps . The injection was completed with Kenalog 40 mg/ml 0.2 cc mixed with 6 cc of 1% lidocaine after no blood was aspirated on pull back.  No complications were encountered. The patient tolerated the procedure well.  Impression: HPI: [name] [name] is a 82 y.o. male with PMHx has Coronary artery disease; OA (osteoarthritis) of knee; Gastroesophageal reflux disease without esophagitis; Essential hypertension, benign; Hyperlipidemia with target LDL less than 70; Chronic renal disease, stage 3, moderately decreased glomerular filtration rate  (GFR) between  30-59 mL/min/1.73 square meter (HCC); Seasonal allergic rhinitis due to pollen; Benign prostatic hyperplasia without lower urinary tract symptoms; Presence of permanent cardiac pacemaker; Sleep apnea; Hypothyroidism; Myofascial neck pain; Degenerative disc disease, cervical; Hypogonadism male; Moderate persistent asthma without complication; Chronic bronchitis, mucopurulent (HCC); Sinus node dysfunction (HCC); Thoracic aortic aneurysm (HCC); Moderate aortic stenosis; Chronic tension-type headache, not intractable; Postural kyphosis of cervicothoracic region; Cervico-occipital neuralgia of right side; Prediabetes; Muscle spasms of neck; Chronic pain syndrome; and Myalgia, shoulder region on their problem list. who presents to clinic for treatment of myofascial neck and R shoulder pain and cervicalgia. They received a  Right  trigger point injections as above.   PLAN: - Resume Usual Activities. Notify Physician of any unusual bleeding, erythema or concern for side effects as reviewed above. - Apply ice prn for pain - Tylenol prn for pain - Follow up in 1 month for repeat injections if benefitting - Not a surgical candicate per evaluation by Dr. Jake Samples; will continue conservative management - Advised conservative management of L bicepital tendonitis; voltaren gel and aspercreme patches PRN  Patient/Care Giver was ready to learn without apparent learning barriers. Education was provided on diagnosis, treatment options/plan according to patient's preferred learning style. Patient/Care Giver verbalized understanding and agreement with the above plan.   Angelina Sheriff, DO 05/09/2023

## 2023-05-15 ENCOUNTER — Telehealth: Payer: Self-pay

## 2023-05-15 DIAGNOSIS — M25512 Pain in left shoulder: Secondary | ICD-10-CM

## 2023-05-15 DIAGNOSIS — G894 Chronic pain syndrome: Secondary | ICD-10-CM | POA: Diagnosis not present

## 2023-05-15 DIAGNOSIS — M5481 Occipital neuralgia: Secondary | ICD-10-CM

## 2023-05-15 DIAGNOSIS — M47812 Spondylosis without myelopathy or radiculopathy, cervical region: Secondary | ICD-10-CM | POA: Diagnosis not present

## 2023-05-15 DIAGNOSIS — M542 Cervicalgia: Secondary | ICD-10-CM | POA: Diagnosis not present

## 2023-05-15 NOTE — Telephone Encounter (Signed)
New order that says lab collect

## 2023-05-16 LAB — BASIC METABOLIC PANEL
BUN/Creatinine Ratio: 28 — ABNORMAL HIGH (ref 10–24)
BUN: 31 mg/dL — ABNORMAL HIGH (ref 8–27)
CO2: 23 mmol/L (ref 20–29)
Calcium: 10.4 mg/dL — ABNORMAL HIGH (ref 8.6–10.2)
Chloride: 105 mmol/L (ref 96–106)
Creatinine, Ser: 1.1 mg/dL (ref 0.76–1.27)
Glucose: 95 mg/dL (ref 70–99)
Potassium: 4.5 mmol/L (ref 3.5–5.2)
Sodium: 142 mmol/L (ref 134–144)
eGFR: 67 mL/min/{1.73_m2} (ref >59)

## 2023-05-16 NOTE — Progress Notes (Signed)
GFR >60; patient ok to continue NSAIDs with recommended  regular monitoring.

## 2023-05-22 NOTE — Progress Notes (Signed)
Remote pacemaker transmission.   

## 2023-06-03 ENCOUNTER — Other Ambulatory Visit: Payer: Self-pay | Admitting: Internal Medicine

## 2023-06-03 DIAGNOSIS — E291 Testicular hypofunction: Secondary | ICD-10-CM

## 2023-06-05 ENCOUNTER — Other Ambulatory Visit: Payer: Self-pay | Admitting: Internal Medicine

## 2023-06-05 ENCOUNTER — Encounter: Payer: Self-pay | Admitting: Internal Medicine

## 2023-06-05 ENCOUNTER — Telehealth: Payer: Self-pay

## 2023-06-05 ENCOUNTER — Ambulatory Visit (INDEPENDENT_AMBULATORY_CARE_PROVIDER_SITE_OTHER): Payer: Medicare Other | Admitting: Internal Medicine

## 2023-06-05 ENCOUNTER — Ambulatory Visit (INDEPENDENT_AMBULATORY_CARE_PROVIDER_SITE_OTHER): Payer: Medicare Other

## 2023-06-05 VITALS — BP 96/60 | HR 61 | Temp 98.3°F | Ht 72.0 in | Wt 193.0 lb

## 2023-06-05 DIAGNOSIS — Z950 Presence of cardiac pacemaker: Secondary | ICD-10-CM | POA: Diagnosis not present

## 2023-06-05 DIAGNOSIS — R051 Acute cough: Secondary | ICD-10-CM | POA: Diagnosis not present

## 2023-06-05 DIAGNOSIS — I950 Idiopathic hypotension: Secondary | ICD-10-CM | POA: Diagnosis not present

## 2023-06-05 DIAGNOSIS — N1831 Chronic kidney disease, stage 3a: Secondary | ICD-10-CM | POA: Diagnosis not present

## 2023-06-05 DIAGNOSIS — R059 Cough, unspecified: Secondary | ICD-10-CM | POA: Diagnosis not present

## 2023-06-05 DIAGNOSIS — J0100 Acute maxillary sinusitis, unspecified: Secondary | ICD-10-CM | POA: Diagnosis not present

## 2023-06-05 DIAGNOSIS — R9431 Abnormal electrocardiogram [ECG] [EKG]: Secondary | ICD-10-CM | POA: Diagnosis not present

## 2023-06-05 DIAGNOSIS — I10 Essential (primary) hypertension: Secondary | ICD-10-CM | POA: Diagnosis not present

## 2023-06-05 DIAGNOSIS — I350 Nonrheumatic aortic (valve) stenosis: Secondary | ICD-10-CM

## 2023-06-05 DIAGNOSIS — R0989 Other specified symptoms and signs involving the circulatory and respiratory systems: Secondary | ICD-10-CM | POA: Diagnosis not present

## 2023-06-05 DIAGNOSIS — R918 Other nonspecific abnormal finding of lung field: Secondary | ICD-10-CM | POA: Diagnosis not present

## 2023-06-05 LAB — CBC WITH DIFFERENTIAL/PLATELET
Basophils Absolute: 0.1 10*3/uL (ref 0.0–0.1)
Basophils Relative: 0.7 % (ref 0.0–3.0)
Eosinophils Absolute: 0.1 10*3/uL (ref 0.0–0.7)
Eosinophils Relative: 1.7 % (ref 0.0–5.0)
HCT: 41.2 % (ref 39.0–52.0)
Hemoglobin: 13.9 g/dL (ref 13.0–17.0)
Lymphocytes Relative: 16.9 % (ref 12.0–46.0)
Lymphs Abs: 1.5 10*3/uL (ref 0.7–4.0)
MCHC: 33.8 g/dL (ref 30.0–36.0)
MCV: 103.7 fL — ABNORMAL HIGH (ref 78.0–100.0)
Monocytes Absolute: 1.2 10*3/uL — ABNORMAL HIGH (ref 0.1–1.0)
Monocytes Relative: 13.3 % — ABNORMAL HIGH (ref 3.0–12.0)
Neutro Abs: 5.9 10*3/uL (ref 1.4–7.7)
Neutrophils Relative %: 67.4 % (ref 43.0–77.0)
Platelets: 241 10*3/uL (ref 150.0–400.0)
RBC: 3.97 Mil/uL — ABNORMAL LOW (ref 4.22–5.81)
RDW: 13.3 % (ref 11.5–15.5)
WBC: 8.8 10*3/uL (ref 4.0–10.5)

## 2023-06-05 LAB — CORTISOL: Cortisol, Plasma: 8.2 ug/dL

## 2023-06-05 LAB — BASIC METABOLIC PANEL
BUN: 33 mg/dL — ABNORMAL HIGH (ref 6–23)
CO2: 24 meq/L (ref 19–32)
Calcium: 10.1 mg/dL (ref 8.4–10.5)
Chloride: 106 meq/L (ref 96–112)
Creatinine, Ser: 1.17 mg/dL (ref 0.40–1.50)
GFR: 57.89 mL/min — ABNORMAL LOW (ref >60.00)
Glucose, Bld: 107 mg/dL — ABNORMAL HIGH (ref 70–99)
Potassium: 3.6 meq/L (ref 3.5–5.1)
Sodium: 140 meq/L (ref 135–145)

## 2023-06-05 LAB — TSH: TSH: 0.76 u[IU]/mL (ref 0.35–5.50)

## 2023-06-05 LAB — TROPONIN I (HIGH SENSITIVITY): High Sens Troponin I: 19 ng/L (ref 2–17)

## 2023-06-05 MED ORDER — CEFDINIR 300 MG PO CAPS: 300.0000 mg | ORAL_CAPSULE | Freq: Two times a day (BID) | ORAL | 0 refills | Status: DC

## 2023-06-05 NOTE — Progress Notes (Signed)
 Subjective:  Patient ID: BACILIO ABASCAL, male    DOB: [birth year]  Age: 82 y.o. MRN: [id number]  CC: URI   HPI DENNIES COATE presents for f/up ---  Discussed the use of AI scribe software for clinical note transcription with the patient, who gave verbal consent to proceed.  History of Present Illness   The patient presents with a chief complaint of hoarseness, sinus drainage, and productive cough, which have been ongoing for 7 days. He also reports a sore throat and a low-grade fever. The patient describes experiencing fluctuations in body temperature, feeling alternately hot and cold. Upon standing, he experienced shortness of breath and noted a decrease in his blood pressure to 96/60, which is lower than his usual readings.  The patient has a history of hypertension and was previously on three doses of hydralazine  daily, which he self-adjusted to two doses due to lower blood pressure readings at recent appointments. He also reports occasional heart fluttering over the past two to three weeks, accompanied by light chest pain and occasional lightheadedness.  In addition to these symptoms, the patient has been experiencing night sweats, although he describes these as not severe. He has not been treated for pneumonia previously, but reports having a viral infection prior to a recent cruise.       Outpatient Medications Prior to Visit  Medication Sig Dispense Refill   acyclovir  (ZOVIRAX ) 200 MG capsule TAKE 1 CAPSULE BY MOUTH THREE TIMES A DAY 270 capsule 0   anastrozole  (ARIMIDEX ) 1 MG tablet TAKE 1 TABLET TWICE A WEEK 24 tablet 1   ANORO ELLIPTA  62.5-25 MCG/ACT AEPB INHALE 1 PUFF BY MOUTH EVERY DAY 60 each 11   ARMOUR THYROID  60 MG tablet TAKE 1 TABLET (60 MG TOTAL) BY MOUTH DAILY BEFORE BREAKFAST. 90 tablet 0   Cholecalciferol (VITAMIN D-3) 125 MCG (5000 UT) TABS Take 125 mcg by mouth daily.     diclofenac Sodium (VOLTAREN) 1 % GEL Apply 2 g topically 4 (four) times daily.     ezetimibe   (ZETIA ) 10 MG tablet TAKE 1 TABLET BY MOUTH EVERY DAY 90 tablet 1   finasteride  (PROSCAR ) 5 MG tablet TAKE 1 TABLET BY MOUTH EVERY DAY 90 tablet 0   fluticasone  (FLONASE ) 50 MCG/ACT nasal spray SPRAY 2 SPRAYS INTO EACH NOSTRIL EVERY DAY 48 mL 1   hydrALAZINE  (APRESOLINE ) 25 MG tablet TAKE 1 TABLET BY MOUTH THREE TIMES A DAY 270 tablet 1   ipratropium (ATROVENT ) 0.03 % nasal spray Place 2 sprays into both nostrils 3 (three) times daily as needed for rhinitis. 30 mL 12   levocetirizine (XYZAL ) 5 MG tablet TAKE 1 TABLET BY MOUTH EVERY DAY IN THE EVENING 90 tablet 1   liothyronine  (CYTOMEL ) 25 MCG tablet TAKE 1 TABLET BY MOUTH EVERY DAY IN THE MORNING 90 tablet 0   meloxicam  (MOBIC ) 15 MG tablet Take 15 mg by mouth daily.     meloxicam  (MOBIC ) 7.5 MG tablet      montelukast  (SINGULAIR ) 10 MG tablet TAKE 1 TABLET BY MOUTH EVERYDAY AT BEDTIME 90 tablet 1   Nandrolone Decanoate  200 MG/ML OIL Inject 100 mg as directed once a week. 10 mL 1   Omega-3 Fatty Acids (FISH OIL) 1200 MG CAPS Take by mouth.     omeprazole  (PRILOSEC) 20 MG capsule Take by mouth.     Pitavastatin  Calcium  4 MG TABS TAKE 1 TABLET DAILY 90 tablet 0   potassium gluconate 595 (99 K) MG TABS tablet Take by mouth.  Probiotic Product (PRODIGEN) CAPS Take 1 capsule by mouth daily.     Simethicone  40 MG/0.6ML LIQD Take 0.6 mLs (40 mg total) by mouth 4 (four) times daily. 300 mL 0   Somatropin  (OMNITROPE ) 5 MG/1.5ML SOCT Inject into the skin.     SYRINGE-NEEDLE, DISP, 3 ML 23G X 1 3 ML MISC Use to inject testosterone  as prescribed. 50 each 0   tamsulosin  (FLOMAX ) 0.4 MG CAPS capsule TAKE 1 CAPSULE BY MOUTH EVERY DAY IN THE MORNING 90 capsule 1   telmisartan (MICARDIS) 80 MG tablet      testosterone  cypionate (DEPOTESTOSTERONE CYPIONATE) 200 MG/ML injection INJECT 1.25 MLS (250 MG TOTAL) INTO THE MUSCLE EVERY 7 (SEVEN) DAYS. 10 mL 0   tiZANidine  (ZANAFLEX ) 4 MG tablet TAKE 0.5-1 TABLET AT BEDTIME AS NEEDED FOR MUSCLE SPASMS (FOR NECK  PAIN AND TIGHTNESS). 90 tablet 1   triamcinolone  cream (KENALOG ) 0.1 % Apply 1 Application topically 2 (two) times daily. 80 g 1   No facility-administered medications prior to visit.    ROS Review of Systems  Constitutional:  Positive for chills, fatigue and fever. Negative for appetite change and diaphoresis.  HENT:  Positive for sore throat.   Eyes:  Negative for visual disturbance.  Respiratory:  Positive for cough and shortness of breath. Negative for choking, chest tightness, wheezing and stridor.   Cardiovascular:  Positive for palpitations. Negative for chest pain and leg swelling.  Gastrointestinal: Negative.   Genitourinary: Negative.   Musculoskeletal:  Positive for arthralgias, back pain and neck pain.  Skin: Negative.   Neurological:  Positive for dizziness and light-headedness.  Hematological:  Negative for adenopathy. Does not bruise/bleed easily.  Psychiatric/Behavioral: Negative.      Objective:  BP 96/60 (BP Location: Left Arm, Patient Position: Sitting, Cuff Size: Normal)   Pulse 61   Temp 98.3 F (36.8 C) (Oral)   Ht 6' (1.829 m)   Wt 193 lb (87.5 kg)   SpO2 94%   BMI 26.18 kg/m   BP Readings from Last 3 Encounters:  06/05/23 96/60  05/09/23 126/74  04/04/23 116/61    Wt Readings from Last 3 Encounters:  06/05/23 193 lb (87.5 kg)  05/09/23 198 lb 12.8 oz (90.2 kg)  04/04/23 195 lb (88.5 kg)    Physical Exam Vitals reviewed.  Constitutional:      General: He is not in acute distress.    Appearance: He is not ill-appearing, toxic-appearing or diaphoretic.  HENT:     Nose: Nose normal.     Mouth/Throat:     Mouth: Mucous membranes are moist.  Eyes:     General: No scleral icterus.    Conjunctiva/sclera: Conjunctivae normal.  Cardiovascular:     Rate and Rhythm: Normal rate and regular rhythm.     Heart sounds: Murmur heard.     Systolic murmur is present with a grade of 2/6.     No gallop.     Comments: EKG- Atrial-paced rhythm with  prolonged AV conduction RBBB, inferior infarct pattern - old TWI in lateral leads is new  Pulmonary:     Breath sounds: No stridor. No wheezing, rhonchi or rales.  Abdominal:     General: Abdomen is flat.     Palpations: There is no mass.     Tenderness: There is no abdominal tenderness. There is no guarding.     Hernia: No hernia is present.  Musculoskeletal:     Cervical back: Neck supple.     Right lower leg: No edema.  Left lower leg: No edema.  Lymphadenopathy:     Cervical: No cervical adenopathy.  Skin:    General: Skin is warm and dry.  Neurological:     General: No focal deficit present.     Mental Status: He is alert. Mental status is at baseline.  Psychiatric:        Mood and Affect: Mood normal.        Behavior: Behavior normal.     Lab Results  Component Value Date   WBC 8.8 06/05/2023   HGB 13.9 06/05/2023   HCT 41.2 06/05/2023   PLT 241.0 06/05/2023   GLUCOSE 107 (H) 06/05/2023   CHOL 148 07/05/2022   TRIG 242.0 (H) 07/05/2022   HDL 45.80 07/05/2022   LDLDIRECT 75.0 07/05/2022   LDLCALC 79 08/10/2021   ALT 16 07/05/2022   AST 16 07/05/2022   NA 140 06/05/2023   K 3.6 06/05/2023   CL 106 06/05/2023   CREATININE 1.17 06/05/2023   BUN 33 (H) 06/05/2023   CO2 24 06/05/2023   TSH 0.76 06/05/2023   PSA 0.75 01/11/2023   INR 0.96 07/28/2014   HGBA1C 5.8 07/05/2022    CT SHOULDER RIGHT W CONTRAST Result Date: 01/02/2023 CLINICAL DATA:  Right shoulder pain, history of arthritis. EXAM: CT ARTHROGRAPHY OF THE RIGHT SHOULDER TECHNIQUE: Multidetector CT imaging was performed following the standard protocol after injection of dilute contrast into the joint. RADIATION DOSE REDUCTION: This exam was performed according to the departmental dose-optimization program which includes automated exposure control, adjustment of the mA and/or kV according to patient size and/or use of iterative reconstruction technique. COMPARISON:  None Available. FINDINGS: Rotator  cuff: Partial thickness bursal surface tear of the supraspinatus tendon 2.7 cm from the peripheral insertion. Infraspinatus tendon is intact. Teres minor tendon is intact. Subscapularis tendon is intact. Muscles: No muscle atrophy. No intramuscular fluid collection or hematoma. Biceps Long Head: Intraarticular and extraarticular portions of the biceps tendon are intact. Acromioclavicular Joint: Mild arthropathy of the acromioclavicular joint. No subacromial/subdeltoid bursal fluid. Glenohumeral Joint: Intraarticular contrast distending the joint capsule. Normal glenohumeral ligaments. Mild partial-thickness cartilage loss of the glenohumeral joint. Labrum: Intact. Bones: No fracture or dislocation. No aggressive osseous lesion. Other: No fluid collection or hematoma. 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 Mild thoracic aortic atherosclerosis. IMPRESSION: 1. Partial thickness bursal surface tear of the supraspinatus tendon 2.7 cm from the peripheral insertion. 2. Mild osteoarthritis of the glenohumeral joint. 3. 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 Recommend annual imaging followup by CTA or MRA. This recommendation follows 2010 ACCF/AHA/AATS/ACR/ASA/SCA/SCAI/SIR/STS/SVM Guidelines for the Diagnosis and Management of Patients with Thoracic Aortic Disease. Circulation. 2010; 121: Z733-z630. Aortic aneurysm NOS (ICD10-I71.9) 4.  Aortic Atherosclerosis (ICD10-I70.0). Electronically Signed   By: Julaine Blanch M.D.   On: 01/02/2023 06:50   DG FLUORO GUIDED NEEDLE PLC ASPIRATION/INJECTION LOC Result Date: 12/22/2022 CLINICAL DATA:  Chronic right shoulder pain. EXAM: RIGHT SHOULDER INJECTION UNDER FLUOROSCOPY COMPARISON:  None Available. FLUOROSCOPY: Radiation Exposure Index (as provided by the fluoroscopic device): 0.4 mGy Kerma PROCEDURE: The risks  and benefits of the procedure were discussed with the patient, and written informed consent was obtained. The patient stated no history of allergy to contrast media. A formal timeout procedure was performed with the patient according to departmental protocol. The patient was placed supine on the fluoroscopy table and the right  glenohumeral joint was identified under fluoroscopy. The skin overlying the right glenohumeral joint was subsequently cleaned with Betadine and a sterile drape was placed over the area of interest. 2 ml 1% Lidocaine  was used to anesthetize the skin around the needle insertion site. A 22 gauge spinal needle was inserted into the right glenohumeral joint under fluoroscopy. 12 ml of contrast mixture (10 ml of sterile saline and 10 ml of Isovue -M 200 contrast) were injected into the right glenohumeral joint. The needle was removed and hemostasis was achieved. The patient was subsequently transferred to CT for imaging. IMPRESSION: Technically successful right shoulder injection under fluoroscopy for CT. Electronically Signed   By: Elsie ONEIDA Shoulder M.D.   On: 12/22/2022 14:47   DG Chest 2 View Result Date: 06/05/2023 CLINICAL DATA:  Cough and congestion for 2-3 days EXAM: CHEST - 2 VIEW COMPARISON:  X-ray 10/15/2021 and CT FINDINGS: Hyperinflation. There is some linear opacity lung bases likely scar or atelectasis. No consolidation, pneumothorax or effusion. No edema. Normal cardiopericardial silhouette. Degenerative changes along the spine. Right upper chest battery pack with pacemaker leads along the right side of the heart. IMPRESSION: Hyperinflation with basilar atelectasis or scar.  Pacemaker. Electronically Signed   By: Ranell Bring M.D.   On: 06/05/2023 12:13     Assessment & Plan:  Acute cough -     DG Chest 2 View; Future  Essential hypertension, benign -     EKG 12-Lead -     Basic metabolic panel; Future -     CBC with Differential/Platelet; Future -     TSH;  Future  Idiopathic hypotension- Labs are negative for secondary causes. Will hold hydralazine  and evaluate with an ECHO -     Basic metabolic panel; Future -     CBC with Differential/Platelet; Future -     Cortisol; Future -     TSH; Future -     ECHOCARDIOGRAM COMPLETE; Future  Abnormal EKG- Will evaluate with an ECHO. -     Troponin I (High Sensitivity); Future -     ECHOCARDIOGRAM COMPLETE; Future  Stage 3a chronic kidney disease (HCC)  Acute non-recurrent maxillary sinusitis -     Cefdinir ; Take 1 capsule (300 mg total) by mouth 2 (two) times daily.  Dispense: 20 capsule; Refill: 0  Moderate aortic stenosis -     ECHOCARDIOGRAM COMPLETE; Future     Follow-up: No follow-ups on file.  Debby Molt, MD

## 2023-06-05 NOTE — Telephone Encounter (Signed)
 CRITICAL VALUE STICKER  CRITICAL VALUE: Troponin 19  RECEIVER (on-site recipient of call): Pranav Lince   DATE & TIME NOTIFIED: 06/05/2023 11:08  MESSENGER (representative from lab): Sai  MD NOTIFIED:   TIME OF NOTIFICATION:  RESPONSE:

## 2023-06-05 NOTE — Telephone Encounter (Signed)
Date and Time MD notified: 06/05/2023 at 11:12

## 2023-06-06 ENCOUNTER — Encounter: Payer: Self-pay | Admitting: Internal Medicine

## 2023-06-07 ENCOUNTER — Telehealth: Payer: Self-pay

## 2023-06-07 ENCOUNTER — Ambulatory Visit: Payer: Self-pay | Admitting: Internal Medicine

## 2023-06-07 NOTE — Telephone Encounter (Signed)
 Copied from CRM [id number]. Topic: Clinical - Lab/Test Results >> Jun 07, 2023  9:49 AM Theodis Sato wrote: Reason for CRM: Patient and wife are requesting a phone call from Dr.Jones and or his nurse regarding most recent X-RAY results.

## 2023-06-07 NOTE — Telephone Encounter (Addendum)
 Copied from CRM [id number]. Topic: Clinical - Red Word Triage >> Jun 07, 2023 12:20 PM Victoria A wrote: [facility] that prompted transfer to Nurse Triage: Patients wife called said patient is pale, HPB is 93/80 and patient almost fainted    Chief Complaint: Dizziness and request for test result Symptoms: Intermittent dizziness Frequency: Ongoing since mid December, worse now Disposition: [] ED /[] Urgent Care (no appt availability in office) / [x] Appointment(In office/virtual)/ []  [place] [facility]/ [] Home Care/ [] Refused Recommended Disposition /[] [place] [facility]/ []  Follow-up with PCP  Additional Notes: Patient stated he has been having intermittent dizziness since mid December but it is getting worse now. Earlier today, patient was dizzy when standing and he needed assistance from his wife to get to the bed. Patient states he was on the verge of passing out. Patient is laying down and he feels okay at the moment. Patient also mentioned low BP, but unable to further assess due to patient beginning to sound agitated with the questioning and he repeatedly requested test results. I also attempted to schedule appointment, but patient declined. He stated he just needs to know whether he has pneumonia or not. He is requesting for Dr. Joshua to call him with x ray results. Please call a at [phone].    Reason for Disposition  [1] MODERATE dizziness (e.g., interferes with normal activities) AND [2] has NOT been evaluated by doctor (or NP/PA) for this  (Exception: Dizziness caused by heat exposure, sudden standing, or poor fluid intake.)  Answer Assessment - Initial Assessment Questions 1. DESCRIPTION: Describe your dizziness.     Dizziness with standing and walking  2. VERTIGO: Do you feel like either you or the room is spinning or tilting? (i.e. vertigo)     Room was swaying  3. SEVERITY: How bad is it?  Do you feel like you are going to faint? Can you stand and walk?   -  MILD: Feels slightly dizzy, but walking normally.   - MODERATE: Feels unsteady when walking, but not falling; interferes with normal activities (e.g., school, work).   - SEVERE: Unable to walk without falling, or requires assistance to walk without falling; feels like passing out now.      Was unable to walk without assistance. Wife had to help patient to the bed.  4. ONSET:  When did the dizziness begin?     Since December 14, getting worse now  5. AGGRAVATING FACTORS: Does anything make it worse? (e.g., standing, change in head position)     Standing  6. CAUSE: What do you think is causing the dizziness?     Unknown  7. RECURRENT SYMPTOM: Have you had dizziness before? If Yes, ask: When was the last time? What happened that time?     Yes, but it is worse now  8. OTHER SYMPTOMS: Do you have any other symptoms? (e.g., fever, chest pain, vomiting, diarrhea, bleeding)       Cough  Protocols used: Dizziness - Lightheadedness-A-AH

## 2023-06-07 NOTE — Telephone Encounter (Signed)
 Copied from CRM [id number]. Topic: Clinical - Red Word Triage >> Jun 07, 2023  3:26 PM Joen NOVAK wrote: [facility] that prompted transfer to Nurse Triage: PATIENT THINKS HE HAS PNEUMONIA, WIFE STATES DR JOSHUA INFORMED THEM HE WOULD CALL THEM BACK ON 12/31 WITH XRAY RESULTS. PATIENT WIFE STATES HE ALMOST PASSED OUT THIS MORNING AND SHE CAUGHT HIM. BLOOD PRESSURE WAS 90/50. PATIENT STATING HE HAS HAD SOME CHOKING DURING IS SLEEP WHILE ON CPAP MACHINE  Chief Complaint:   Patient called and said the information his wife  noted  was from when he first got sick.  Patient states he wanted to know if  the chest xray showed pneumonia and what was his plan for treatment? Patient states he was told Dr Joshua was out of the office until next Tuesday. Patient states the information his wife provided was information that was already shared with provider.  Patient states he feels like he is getting weaker and wants to speak with Dr. Joshua. Patient advised to go to Emergency Department now based on him feeling like he is getting weaker. Patient states he declines and only  want Dr. Joshua to call him on his phone. Patient states he is not on my chart at this time and done want to fool with it today. Symptoms:  Patient states he feels like he is getting weaker. He only wants Dr Joshua to call him .   Disposition: [x] ED /[] Urgent Care (no appt availability in office) / [] Appointment(In office/virtual)/ []  [place] [facility]/ [] Home Care/ [x] Refused Recommended Disposition /[]  Mobile Bus/ []  Follow-up with PCP Additional Notes: He only wants Dr Joshua to call him .  Reason for Disposition  Patient sounds very sick or weak to the triager  Protocols used: Pneumonia Follow-up Call-A-AH

## 2023-06-08 NOTE — Telephone Encounter (Signed)
 Call and spoke with the patient he states that he was able to get into his mychart and view his results. He stated he picked his antibiotic up yesterday and begin treatment, He will give the office a call if he doesn't start feeling better now that he is on his meds.

## 2023-06-08 NOTE — Telephone Encounter (Signed)
 This has been handled and documented on another telephone encounter.

## 2023-06-11 ENCOUNTER — Encounter: Payer: Medicare Other | Admitting: Internal Medicine

## 2023-06-11 DIAGNOSIS — C44622 Squamous cell carcinoma of skin of right upper limb, including shoulder: Secondary | ICD-10-CM | POA: Diagnosis not present

## 2023-06-11 DIAGNOSIS — L570 Actinic keratosis: Secondary | ICD-10-CM | POA: Diagnosis not present

## 2023-06-11 DIAGNOSIS — C44629 Squamous cell carcinoma of skin of left upper limb, including shoulder: Secondary | ICD-10-CM | POA: Diagnosis not present

## 2023-06-11 DIAGNOSIS — L820 Inflamed seborrheic keratosis: Secondary | ICD-10-CM | POA: Diagnosis not present

## 2023-06-11 DIAGNOSIS — Z85820 Personal history of malignant melanoma of skin: Secondary | ICD-10-CM | POA: Diagnosis not present

## 2023-06-11 DIAGNOSIS — L821 Other seborrheic keratosis: Secondary | ICD-10-CM | POA: Diagnosis not present

## 2023-06-11 DIAGNOSIS — D485 Neoplasm of uncertain behavior of skin: Secondary | ICD-10-CM | POA: Diagnosis not present

## 2023-06-11 NOTE — Progress Notes (Deleted)
 Office Visit Note  Patient: [name] Glass             Date of Birth: 07/06/1940           MRN: [id number]             PCP: Joshua Debby CROME, MD Referring: Merlynn Niki FALCON, FNP Visit Date: 06/11/2023 Occupation: @GUAROCC @  Subjective:  No chief complaint on file.   History of Present Illness: [name] Glass is a 83 y.o. male ***   Meloxicam  Injections Trigger points Cervical DDD ***  Labs reviewed 12/2022 ANA 1:40 fine speckled RF 14.9 CCP neg  12/22/22 CT Right Shoulder IMPRESSION: 1. Partial thickness bursal surface tear of the supraspinatus tendon 2.7 cm from the peripheral insertion. 2. Mild osteoarthritis of the glenohumeral joint. 3. 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 Recommend annual imaging followup by CTA or MRA. This recommendation follows 2010 ACCF/AHA/AATS/ACR/ASA/SCA/SCAI/SIR/STS/SVM Guidelines for the Diagnosis and Management of Patients with Thoracic Aortic Disease. Circulation. 2010; 121: Z733-z630. Aortic aneurysm NOS (ICD10-I71.9) 4.  Aortic Atherosclerosis (ICD10-I70.0).  10/20/22 MRI Left Shoulder IMPRESSION: 1. Mild tendinosis of the supraspinatus and subscapularis tendons. 2. Mild subacromial/subdeltoid bursitis.  Activities of Daily Living:  Patient reports morning stiffness for *** {minute/hour:19697}.   Patient {ACTIONS;DENIES/REPORTS:21021675::Denies} nocturnal pain.  Difficulty dressing/grooming: {ACTIONS;DENIES/REPORTS:21021675::Denies} Difficulty climbing stairs: {ACTIONS;DENIES/REPORTS:21021675::Denies} Difficulty getting out of chair: {ACTIONS;DENIES/REPORTS:21021675::Denies} Difficulty using hands for taps, buttons, cutlery, and/or writing: {ACTIONS;DENIES/REPORTS:21021675::Denies}  No Rheumatology ROS completed.   PMFS History:  Patient Active Problem List   Diagnosis Date Noted   Acute cough 06/05/2023   Abnormal EKG 06/05/2023    Idiopathic hypotension 06/05/2023   Myalgia, shoulder region 01/24/2023   Chronic pain syndrome 12/27/2022   Prediabetes 07/05/2022   Cervico-occipital neuralgia of right side 04/17/2022   Chronic tension-type headache, not intractable 02/27/2022   Postural kyphosis of cervicothoracic region 02/27/2022   Moderate aortic stenosis 10/27/2020   Thoracic aortic aneurysm (HCC) 03/12/2020   Sinus node dysfunction (HCC) 02/17/2020   Moderate persistent asthma without complication 06/09/2019   Chronic bronchitis, mucopurulent (HCC) 06/09/2019   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   Myofascial neck pain 04/01/2019   Degenerative disc disease, cervical 04/01/2019   Hypothyroidism    Presence of permanent cardiac pacemaker 10/23/2018   Sleep apnea 10/23/2018   Benign prostatic hyperplasia without lower urinary tract symptoms 05/21/2017   Seasonal allergic rhinitis due to pollen 05/18/2017   Chronic renal disease, stage 3, moderately decreased glomerular filtration rate (GFR) between 30-59 mL/min/1.73 square meter (HCC) 06/14/2016   Gastroesophageal reflux disease without esophagitis 12/19/2015   Essential hypertension, benign 12/19/2015   Hyperlipidemia with target LDL less than 70 12/19/2015   OA (osteoarthritis) of knee 08/03/2014   Coronary artery disease 07/10/2014    Past Medical History:  Diagnosis Date   Arthritis    Basal cell carcinoma 02/17/2020   infl & ulcerated-Left forehead (MOHS)   Coronary artery disease    GERD (gastroesophageal reflux disease)    H/O bronchitis    H/O hiatal hernia    History of kidney stones last in 1976   Hyperlipidemia    statin intolerant, under control   Hypertension    under control   Hypertensive retinopathy    OU   Hypothyroidism    Moderate persistent asthma without complication 06/09/2019   Retinal detachment    OD   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   left helix tx cx3 [id number]  Sleep apnea    hx of, surgery to reverse     Family History  Problem Relation Age of Onset   Heart attack Father 19       lived to 36   Alzheimer's disease Mother    Stroke Mother    Breast cancer Mother    Alzheimer's disease Maternal Grandmother    Stroke Maternal Grandmother    Lung cancer Maternal Grandmother    Lung cancer Maternal Grandfather    Cancer Paternal Grandmother        type unknown   Diabetes Paternal Grandfather    Other Paternal Grandfather        Growth in the back of the head   Past Surgical History:  Procedure Laterality Date   CARDIAC CATHETERIZATION Right 2008   5 heart stents   CATARACT EXTRACTION Bilateral 6 years ago   CYSTOSCOPY N/A 08/06/2012   Procedure: CYSTOSCOPY;  Surgeon: Norleen JINNY Seltzer, MD;  Location: [facility];  Service: Urology;  Laterality: N/A;   ESOPHAGOGASTRIC FUNDOPLICATION  1985   spleen removed, 5 pints of blood given   EYE SURGERY     HERNIA REPAIR  1985   x4   INGUINAL HERNIA REPAIR  1992   PACEMAKER IMPLANT N/A 10/30/2018   Procedure: PACEMAKER IMPLANT;  Surgeon: Inocencio Soyla Lunger, MD;  Location: [facility];  Service: Cardiovascular;  Laterality: N/A;   PACEMAKER INSERTION     PPM GENERATOR REMOVAL N/A 10/25/2018   Procedure: PPM GENERATOR REMOVAL;  Surgeon: Inocencio Soyla Lunger, MD;  Location: [facility];  Service: Cardiovascular;  Laterality: N/A;   PROSTATE ABLATION  2013   laser ablation   RETINAL DETACHMENT SURGERY Right 01/24/2019   Pneumatic cryopexy - Dr. Redell Hans   TEE WITHOUT CARDIOVERSION N/A 10/25/2018   Procedure: TRANSESOPHAGEAL ECHOCARDIOGRAM (TEE);  Surgeon: Alveta Aleene JINNY, MD;  Location: [facility]OS[facility];  Service: Cardiovascular;  Laterality: N/A;   TEMPORARY PACEMAKER N/A 10/25/2018   Procedure: TEMPORARY PACEMAKER;  Surgeon: Inocencio Soyla Lunger, MD;  Location: [facility];  Service: Cardiovascular;  Laterality: N/A;   TONSILLECTOMY  as child   TOTAL KNEE ARTHROPLASTY Right 08/03/2014   Procedure: TOTAL RIGHT KNEE ARTHROPLASTY;   Surgeon: Dempsey Melodi GAILS, MD;  Location: [facility];  Service: Orthopedics;  Laterality: Right;   TRANSURETHRAL RESECTION OF PROSTATE N/A 08/06/2012   Procedure: AMIL OF PROSTATE WITH GYRUS ;  Surgeon: Norleen JINNY Seltzer, MD;  Location: [facility];  Service: Urology;  Laterality: N/A;   UMBILICAL HERNIA REPAIR  1990   UVULECTOMY  2005   for sleep apnea   UVULOPALATOPHARYNGOPLASTY  6 years ago   Social History   Social History Narrative   Not on file   Immunization History  Administered Date(s) Administered   Fluad Quad(high Dose 65+) 04/01/2019, 03/22/2020   Influenza, High Dose Seasonal PF 07/13/2016, 04/19/2017   Influenza-Unspecified 05/04/2021   PFIZER(Purple Top)SARS-COV-2 Vaccination 06/24/2019, 07/15/2019   Pfizer Covid-19 Vaccine Bivalent Booster 5y-11y 05/04/2021   Pfizer(Comirnaty)Fall Seasonal Vaccine 12 years and older 04/25/2022, 02/15/2023   Pneumococcal Conjugate-13 06/26/2017   Pneumococcal Polysaccharide-23 12/16/2015, 01/17/2022   Respiratory Syncytial Virus Vaccine,Recomb Aduvanted(Arexvy) 05/10/2022   Tdap 06/26/2017   Zoster Recombinant(Shingrix) 06/14/2019, 10/20/2019     Objective: Vital Signs: There were no vitals taken for this visit.   Physical Exam   Musculoskeletal Exam: ***  CDAI Exam: CDAI Score: -- Patient Global: --; Provider Global: -- Swollen: --; Tender: -- Joint Exam 06/11/2023   No joint exam  has been documented for this visit   There is currently no information documented on the homunculus. Go to the Rheumatology activity and complete the homunculus joint exam.  Investigation: No additional findings.  Imaging: DG Chest 2 View Result Date: 06/05/2023 CLINICAL DATA:  Cough and congestion for 2-3 days EXAM: CHEST - 2 VIEW COMPARISON:  X-ray 10/15/2021 and CT FINDINGS: Hyperinflation. There is some linear opacity lung bases likely scar or atelectasis. No consolidation, pneumothorax or effusion. No edema. Normal cardiopericardial silhouette.  Degenerative changes along the spine. Right upper chest battery pack with pacemaker leads along the right side of the heart. IMPRESSION: Hyperinflation with basilar atelectasis or scar.  Pacemaker. Electronically Signed   By: Ranell Bring M.D.   On: 06/05/2023 12:13    Recent Labs: Lab Results  Component Value Date   WBC 8.8 06/05/2023   HGB 13.9 06/05/2023   PLT 241.0 06/05/2023   NA 140 06/05/2023   K 3.6 06/05/2023   CL 106 06/05/2023   CO2 24 06/05/2023   GLUCOSE 107 (H) 06/05/2023   BUN 33 (H) 06/05/2023   CREATININE 1.17 06/05/2023   BILITOT 0.5 07/05/2022   ALKPHOS 53 07/05/2022   AST 16 07/05/2022   ALT 16 07/05/2022   PROT 6.7 07/05/2022   ALBUMIN 4.4 07/05/2022   CALCIUM  10.1 06/05/2023   GFRAA >60 10/31/2018    Speciality Comments: No specialty comments available.  Procedures:  No procedures performed Allergies: Morphine and codeine, Bactrim [sulfamethoxazole-trimethoprim], Morphine, Statins, Sulfamethoxazole, and Trimethoprim   Assessment / Plan:     Visit Diagnoses: No diagnosis found.  Orders: No orders of the defined types were placed in this encounter.  No orders of the defined types were placed in this encounter.   Face-to-face time spent with patient was *** minutes. Greater than 50% of time was spent in counseling and coordination of care.  Follow-Up Instructions: No follow-ups on file.   Lonni LELON Ester, MD  Note - This record has been created using [employer].  Chart creation errors have been sought, but may not always  have been located. Such creation errors do not reflect on  the standard of medical care.

## 2023-06-12 ENCOUNTER — Ambulatory Visit (INDEPENDENT_AMBULATORY_CARE_PROVIDER_SITE_OTHER): Payer: Medicare Other

## 2023-06-12 ENCOUNTER — Encounter: Payer: Self-pay | Admitting: Cardiovascular Disease

## 2023-06-12 ENCOUNTER — Ambulatory Visit: Payer: Medicare Other | Attending: Cardiovascular Disease | Admitting: Cardiovascular Disease

## 2023-06-12 VITALS — BP 124/80 | HR 60 | Ht 72.0 in | Wt 191.0 lb

## 2023-06-12 DIAGNOSIS — I2510 Atherosclerotic heart disease of native coronary artery without angina pectoris: Secondary | ICD-10-CM | POA: Diagnosis not present

## 2023-06-12 DIAGNOSIS — R9431 Abnormal electrocardiogram [ECG] [EKG]: Secondary | ICD-10-CM

## 2023-06-12 DIAGNOSIS — I10 Essential (primary) hypertension: Secondary | ICD-10-CM

## 2023-06-12 DIAGNOSIS — M47812 Spondylosis without myelopathy or radiculopathy, cervical region: Secondary | ICD-10-CM | POA: Diagnosis not present

## 2023-06-12 DIAGNOSIS — I950 Idiopathic hypotension: Secondary | ICD-10-CM

## 2023-06-12 DIAGNOSIS — E785 Hyperlipidemia, unspecified: Secondary | ICD-10-CM

## 2023-06-12 DIAGNOSIS — Z950 Presence of cardiac pacemaker: Secondary | ICD-10-CM

## 2023-06-12 DIAGNOSIS — I350 Nonrheumatic aortic (valve) stenosis: Secondary | ICD-10-CM

## 2023-06-12 DIAGNOSIS — I7121 Aneurysm of the ascending aorta, without rupture: Secondary | ICD-10-CM

## 2023-06-12 LAB — ECHOCARDIOGRAM COMPLETE
AR max vel: 1.33 cm2
AV Area VTI: 1.53 cm2
AV Area mean vel: 1.26 cm2
AV Mean grad: 14 mm[Hg]
AV Peak grad: 25.6 mm[Hg]
AV Vena cont: 0.26 cm
Ao pk vel: 2.53 m/s
Area-P 1/2: 2.05 cm2
P 1/2 time: 608 ms
S' Lateral: 3.07 cm

## 2023-06-12 NOTE — Patient Instructions (Signed)

## 2023-06-12 NOTE — Assessment & Plan Note (Signed)
 History of CAD status post RCA intervention by myself 06/09/2004.  This is a difficult intervention.  He had a subsequent intervention in [place] which involved atherectomy and staged LAD PCI and drug-eluting stenting.  He really has been fairly asymptomatic from a coronary point of view since.

## 2023-06-12 NOTE — Progress Notes (Signed)
 06/12/2023 DERRYCK SHAHAN   1941-01-10  [id number]  Primary Physician Joshua Debby CROME, MD Primary Cardiologist: Dorn JINNY Lesches MD FACP, [place], [place], FSCAI  HPI:  [name] Glass is a 83 y.o.  moderately overweight although fit-appearing [name] Glass Caucasian male with no children who I last saw 10/27/2020.  He is accompanied by his wife [name] Glass today.  He has a history of CAD status post circumflex and RCA stenting by myself 06/09/04. His other cardiovascular problems include hypertension, hyperlipidemia and statin intolerance. He does drink Pinch blended scotch on a daily basis. He denies chest pain or shortness of breath. 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 He had a Myoview stress test performed 07/16/14 for preoperative clearance prior to a right total knee replacement which was performed by Dr. Ivonne 08/03/14. This was low risk and nonischemic. Since I saw him last he was down in [place] at the [employer] and developed some throat pain. This led to an extensive workup in the hospital including fairly normal 2-D echo, a Myoview stress test. He ruled out for myocardial infarction. He scheduled to have cosmetic surgery on his eyelids and eyebrow 09/27/17 when she is cleared for given his recent negative stress test. He also recently got engaged and and [name] Glass this past June.  He went on a 15-[place] cruise [place].   He was in [place] last year snowmobiling and had an episode of witnessed syncope.  He ultimately was transported by EMS [facility] where he underwent cardiac catheterization by Dr. Arnett radially revealing what he describes as patent stents in his circumflex and RCA which I implanted back in 2006 with high-grade calcified LAD stenosis.  He underwent staged atherectomy and drug-eluting stent implantation.  He also was noted to have long pauses on telemetry with recurrent  syncope and and had a temporary transvenous pacemaker placed followed by permanent pacemaker implantation.     He did transition from Brilinta  to Plavix  because of dyspnea.  He was hospitalized in May 2020, for sepsis.  He tested negative for COVID-19.  He did have a transesophageal echo that did not show evidence of SBE.  He developed a pocket infection and ended up having his pacemaker generator and leads were placed by Dr. Inocencio 10/30/2018.  He has developed significant hematuria and has stopped his Plavix .   Since I saw him 2-1/2 years ago he has remained stable.  He has lost 20 pounds since I saw him.  He recently had an upper respiratory tract infection treated by his PCP.  He also has a swallowing issue as well for unclear reasons.  He gets occasional atypical sensations in his chest which did not sound anginal.  Dr. Fernande follows his pacemaker.  He also has mild to moderate aortic stenosis and is pending thoracic aortic aneurysm which we have been following by annual 2D echocardiogram.  Echo today revealed his aortic stenosis to be mild and his thoracic aortic aneurysm measured 44 mm.  He is scheduled to go on a [place] cruise in the near future with his wife.     Current Meds  Medication Sig   acyclovir  (ZOVIRAX ) 200 MG capsule TAKE 1 CAPSULE BY MOUTH THREE TIMES A DAY   anastrozole  (ARIMIDEX ) 1 MG tablet TAKE 1 TABLET TWICE A WEEK   ANORO ELLIPTA  62.5-25 MCG/ACT AEPB INHALE 1 PUFF BY MOUTH EVERY DAY   ARMOUR  THYROID  60 MG tablet TAKE 1 TABLET (60 MG TOTAL) BY MOUTH DAILY BEFORE BREAKFAST.   cefdinir  (OMNICEF ) 300 MG capsule Take 1 capsule (300 mg total) by mouth 2 (two) times daily.   Cholecalciferol (VITAMIN D-3) 125 MCG (5000 UT) TABS Take 125 mcg by mouth daily.   diclofenac Sodium (VOLTAREN) 1 % GEL Apply 2 g topically 4 (four) times daily.   ezetimibe  (ZETIA ) 10 MG tablet TAKE 1 TABLET BY MOUTH EVERY DAY   finasteride  (PROSCAR ) 5 MG tablet TAKE 1 TABLET BY MOUTH EVERY DAY    fluticasone  (FLONASE ) 50 MCG/ACT nasal spray SPRAY 2 SPRAYS INTO EACH NOSTRIL EVERY DAY   ipratropium (ATROVENT ) 0.03 % nasal spray Place 2 sprays into both nostrils 3 (three) times daily as needed for rhinitis.   levocetirizine (XYZAL ) 5 MG tablet TAKE 1 TABLET BY MOUTH EVERY DAY IN THE EVENING   liothyronine  (CYTOMEL ) 25 MCG tablet TAKE 1 TABLET BY MOUTH EVERY DAY IN THE MORNING   meloxicam  (MOBIC ) 15 MG tablet Take 15 mg by mouth daily.   montelukast  (SINGULAIR ) 10 MG tablet TAKE 1 TABLET BY MOUTH EVERYDAY AT BEDTIME   omeprazole  (PRILOSEC) 20 MG capsule Take by mouth.   Pitavastatin  Calcium  4 MG TABS TAKE 1 TABLET DAILY   potassium gluconate 595 (99 K) MG TABS tablet Take by mouth.   Probiotic Product (PRODIGEN) CAPS Take 1 capsule by mouth daily.   Simethicone  40 MG/0.6ML LIQD Take 0.6 mLs (40 mg total) by mouth 4 (four) times daily.   Somatropin  (OMNITROPE ) 5 MG/1.5ML SOCT Inject into the skin.   tamsulosin  (FLOMAX ) 0.4 MG CAPS capsule TAKE 1 CAPSULE BY MOUTH EVERY DAY IN THE MORNING   tiZANidine  (ZANAFLEX ) 4 MG tablet TAKE 0.5-1 TABLET AT BEDTIME AS NEEDED FOR MUSCLE SPASMS (FOR NECK PAIN AND TIGHTNESS).   triamcinolone  cream (KENALOG ) 0.1 % Apply 1 Application topically 2 (two) times daily.     Allergies  Allergen Reactions   Morphine And Codeine     Had crazy dreams felt crazy per pt.   Bactrim [Sulfamethoxazole-Trimethoprim]    Morphine     Other Reaction(s): Not available   Statins    Sulfamethoxazole    Trimethoprim     Social History   Socioeconomic History   Marital status: [name] Glass    Spouse name: [name] Glass   Number of children: 0   Years of education: Not on file   Highest education level: Not on file  Occupational History   Occupation: Retired   Tobacco Use   Smoking status: Never   Smokeless tobacco: Never  Vaping Use   Vaping status: Never Used  Substance and Sexual Activity   Alcohol use: Yes    Alcohol/week: 4.0 standard drinks of alcohol    Types: 4  Standard drinks or equivalent per week    Comment: 3 drinks at night    Drug use: No   Sexual activity: Not Currently  Other Topics Concern   Not on file  Social History Narrative   Not on file   Social Drivers of Health   Financial Resource Strain: Low Risk  (05/01/2022)   Overall Financial Resource Strain (CARDIA)    Difficulty of Paying Living Expenses: Not hard at all  Food Insecurity: No Food Insecurity (05/01/2022)   Hunger Vital Sign    Worried About Running Out of Food in the Last Year: Never true    Ran Out of Food in the Last Year: Never true  Transportation Needs: No Transportation Needs (05/01/2022)  PRAPARE - Administrator, Civil Service (Medical): No    Lack of Transportation (Non-Medical): No  Physical Activity: Sufficiently Active (05/01/2022)   Exercise Vital Sign    Days of Exercise per Week: 5 days    Minutes of Exercise per Session: 30 min  Stress: No Stress Concern Present (05/01/2022)   [employer] of Occupational Health - Occupational Stress Questionnaire    Feeling of Stress : Not at all  Social Connections: Moderately Integrated (05/01/2022)   Social Connection and Isolation Panel [NHANES]    Frequency of Communication with Friends and Family: More than three times a week    Frequency of Social Gatherings with Friends and Family: More than three times a week    Attends Religious Services: Never    Database Administrator or Organizations: Yes    Attends Engineer, Structural: More than 4 times per year    Marital Status: [name] Glass  Catering Manager Violence: Not At Risk (05/01/2022)   Humiliation, Afraid, Rape, and Kick questionnaire    Fear of Current or Ex-Partner: No    Emotionally Abused: No    Physically Abused: No    Sexually Abused: No     Review of Systems: General: negative for chills, fever, night sweats or weight changes.  Cardiovascular: negative for chest pain, dyspnea on exertion, edema, orthopnea,  palpitations, paroxysmal nocturnal dyspnea or shortness of breath Dermatological: negative for rash Respiratory: negative for cough or wheezing Urologic: negative for hematuria Abdominal: negative for nausea, vomiting, diarrhea, bright red blood per rectum, melena, or hematemesis Neurologic: negative for visual changes, syncope, or dizziness All other systems reviewed and are otherwise negative except as noted above.    Blood pressure 124/80, pulse 60, height 6' (1.829 m), weight 191 lb (86.6 kg), SpO2 94%.  General appearance: alert and no distress Neck: no adenopathy, no carotid bruit, no JVD, supple, symmetrical, trachea midline, and thyroid  not enlarged, symmetric, no tenderness/mass/nodules Lungs: clear to auscultation bilaterally Heart: regular rate and rhythm, S1, S2 normal, no murmur, click, rub or gallop Extremities: extremities normal, atraumatic, no cyanosis or edema Pulses: 2+ and symmetric Skin: Skin color, texture, turgor normal. No rashes or lesions Neurologic: Grossly normal  EKG not performed today.      ASSESSMENT AND PLAN:   Coronary artery disease History of CAD status post RCA intervention by myself 06/09/2004.  This is a difficult intervention.  He had a subsequent intervention in [place] which involved atherectomy and staged LAD PCI and drug-eluting stenting.  He really has been fairly asymptomatic from a coronary point of view since.  Essential hypertension, benign His essential hypertension with blood pressure measured today at 124/80.  His primary care physician, Debby Molt, has discontinued his antihypertensives because of symptomatic hypotension.  Hyperlipidemia with target LDL less than 70 He has hyperlipidemia on Livalo  and Zetia  with lipid profile performed 07/05/2022 revealing total cholesterol 148, LDL 79 and HDL 45.  Presence of permanent cardiac pacemaker History of permanent transvenous pacemaker implantation at [facility] after the PE.  He did develop a pocket infection and underwent generator and lead replacement by Dr. Inocencio 10/30/2018.  He is followed by Dr. Fernande as an outpatient.  Thoracic aortic aneurysm (HCC) History of thoracic aortic aneurysm currently measuring 44 mm by 2D echo performed today.  Will follow this on annual basis.  Moderate aortic stenosis History of aortic stenosis with echo today revealing this to be mild.  He is peak gradient is  26 mmHg with a valve area of 1.53 cm.  He will be followed on an annual basis.     Dorn DOROTHA Lesches MD FACP,FACC,FAHA, [facility] 06/12/2023 10:39 AM

## 2023-06-12 NOTE — Assessment & Plan Note (Signed)
 History of permanent transvenous pacemaker implantation at [facility] after the PE.  He did develop a pocket infection and underwent generator and lead replacement by Dr. Inocencio 10/30/2018.  He is followed by Dr. Fernande as an outpatient.

## 2023-06-12 NOTE — Assessment & Plan Note (Signed)
 He has hyperlipidemia on Livalo and Zetia with lipid profile performed 07/05/2022 revealing total cholesterol 148, LDL 79 and HDL 45.

## 2023-06-12 NOTE — Assessment & Plan Note (Signed)
 History of thoracic aortic aneurysm currently measuring 44 mm by 2D echo performed today.  Will follow this on annual basis.

## 2023-06-12 NOTE — Assessment & Plan Note (Signed)
 History of aortic stenosis with echo today revealing this to be mild.  He is peak gradient is 26 mmHg with a valve area of 1.53 cm.  He will be followed on an annual basis.

## 2023-06-12 NOTE — Assessment & Plan Note (Signed)
 His essential hypertension with blood pressure measured today at 124/80.  His primary care physician, Sanda Linger, has discontinued his antihypertensives because of symptomatic hypotension.

## 2023-06-13 ENCOUNTER — Encounter
Payer: Medicare Other | Attending: Physical Medicine and Rehabilitation | Admitting: Physical Medicine and Rehabilitation

## 2023-06-13 VITALS — BP 132/77 | HR 60 | Ht 72.0 in | Wt 194.0 lb

## 2023-06-13 DIAGNOSIS — M542 Cervicalgia: Secondary | ICD-10-CM | POA: Diagnosis not present

## 2023-06-13 MED ORDER — LIDOCAINE HCL 1 % IJ SOLN
6.0000 mL | Freq: Once | INTRAMUSCULAR | Status: AC
  Administered 2023-06-13: 6 mL

## 2023-06-13 MED ORDER — TRIAMCINOLONE ACETONIDE 40 MG/ML IJ SUSP
0.8000 mg | Freq: Once | INTRAMUSCULAR | Status: AC
  Administered 2023-06-13: 0.8 mg via INTRAMUSCULAR

## 2023-06-13 NOTE — Progress Notes (Signed)
 Subjective:    Patient ID: [name] Glass, male    DOB: December 12, 1940, 83 y.o.   MRN: [id number]  HPI  Pain Inventory Average Pain 7 Pain Right Now 3 My pain is sharp, burning, stabbing, tingling, and aching  In the last 24 hours, has pain interfered with the following? General activity 0 Relation with others 0 Enjoyment of life 3 What TIME of day is your pain at its worst? varies Sleep (in general) Good  Pain is worse with: walking and standing Pain improves with: rest Relief from Meds:  na  Family History  Problem Relation Age of Onset   Heart attack Father 82       lived to 80   Alzheimer's disease Mother    Stroke Mother    Breast cancer Mother    Alzheimer's disease Maternal Grandmother    Stroke Maternal Grandmother    Lung cancer Maternal Grandmother    Lung cancer Maternal Grandfather    Cancer Paternal Grandmother        type unknown   Diabetes Paternal Grandfather    Other Paternal Grandfather        Growth in the back of the head   Social History   Socioeconomic History   Marital status: Married    Spouse name: Mliss   Number of children: 0   Years of education: Not on file   Highest education level: Not on file  Occupational History   Occupation: Retired   Tobacco Use   Smoking status: Never   Smokeless tobacco: Never  Vaping Use   Vaping status: Never Used  Substance and Sexual Activity   Alcohol use: Yes    Alcohol/week: 4.0 standard drinks of alcohol    Types: 4 Standard drinks or equivalent per week    Comment: 3 drinks at night    Drug use: No   Sexual activity: Not Currently  Other Topics Concern   Not on file  Social History Narrative   Not on file   Social Drivers of Health   Financial Resource Strain: Low Risk  (05/01/2022)   Overall Financial Resource Strain (CARDIA)    Difficulty of Paying Living Expenses: Not hard at all  Food Insecurity: No Food Insecurity (05/01/2022)   Hunger Vital Sign    Worried About Running Out of  Food in the Last Year: Never true    Ran Out of Food in the Last Year: Never true  Transportation Needs: No Transportation Needs (05/01/2022)   PRAPARE - Administrator, Civil Service (Medical): No    Lack of Transportation (Non-Medical): No  Physical Activity: Sufficiently Active (05/01/2022)   Exercise Vital Sign    Days of Exercise per Week: 5 days    Minutes of Exercise per Session: 30 min  Stress: No Stress Concern Present (05/01/2022)   [employer] of Occupational Health - Occupational Stress Questionnaire    Feeling of Stress : Not at all  Social Connections: Moderately Integrated (05/01/2022)   Social Connection and Isolation Panel [NHANES]    Frequency of Communication with Friends and Family: More than three times a week    Frequency of Social Gatherings with Friends and Family: More than three times a week    Attends Religious Services: Never    Database Administrator or Organizations: Yes    Attends Engineer, Structural: More than 4 times per year    Marital Status: Married   Past Surgical History:  Procedure Laterality Date  CARDIAC CATHETERIZATION Right 2008   5 heart stents   CATARACT EXTRACTION Bilateral 6 years ago   CYSTOSCOPY N/A 08/06/2012   Procedure: CYSTOSCOPY;  Surgeon: Norleen JINNY Seltzer, MD;  Location: [facility];  Service: Urology;  Laterality: N/A;   ESOPHAGOGASTRIC FUNDOPLICATION  1985   spleen removed, 5 pints of blood given   EYE SURGERY     HERNIA REPAIR  1985   x4   INGUINAL HERNIA REPAIR  1992   PACEMAKER IMPLANT N/A 10/30/2018   Procedure: PACEMAKER IMPLANT;  Surgeon: Inocencio Soyla Lunger, MD;  Location: [facility];  Service: Cardiovascular;  Laterality: N/A;   PACEMAKER INSERTION     PPM GENERATOR REMOVAL N/A 10/25/2018   Procedure: PPM GENERATOR REMOVAL;  Surgeon: Inocencio Soyla Lunger, MD;  Location: [facility];  Service: Cardiovascular;  Laterality: N/A;   PROSTATE ABLATION  2013   laser ablation   RETINAL  DETACHMENT SURGERY Right 01/24/2019   Pneumatic cryopexy - Dr. Redell Hans   TEE WITHOUT CARDIOVERSION N/A 10/25/2018   Procedure: TRANSESOPHAGEAL ECHOCARDIOGRAM (TEE);  Surgeon: Alveta Aleene JINNY, MD;  Location: [facility];  Service: Cardiovascular;  Laterality: N/A;   TEMPORARY PACEMAKER N/A 10/25/2018   Procedure: TEMPORARY PACEMAKER;  Surgeon: Inocencio Soyla Lunger, MD;  Location: [facility];  Service: Cardiovascular;  Laterality: N/A;   TONSILLECTOMY  as child   TOTAL KNEE ARTHROPLASTY Right 08/03/2014   Procedure: TOTAL RIGHT KNEE ARTHROPLASTY;  Surgeon: Dempsey Melodi GAILS, MD;  Location: [facility];  Service: Orthopedics;  Laterality: Right;   TRANSURETHRAL RESECTION OF PROSTATE N/A 08/06/2012   Procedure: AMIL OF PROSTATE WITH GYRUS ;  Surgeon: Norleen JINNY Seltzer, MD;  Location: [facility];  Service: Urology;  Laterality: N/A;   UMBILICAL HERNIA REPAIR  1990   UVULECTOMY  2005   for sleep apnea   UVULOPALATOPHARYNGOPLASTY  6 years ago   Past Surgical History:  Procedure Laterality Date   CARDIAC CATHETERIZATION Right 2008   5 heart stents   CATARACT EXTRACTION Bilateral 6 years ago   CYSTOSCOPY N/A 08/06/2012   Procedure: CYSTOSCOPY;  Surgeon: Norleen JINNY Seltzer, MD;  Location: [facility];  Service: Urology;  Laterality: N/A;   ESOPHAGOGASTRIC FUNDOPLICATION  1985   spleen removed, 5 pints of blood given   EYE SURGERY     HERNIA REPAIR  1985   x4   INGUINAL HERNIA REPAIR  1992   PACEMAKER IMPLANT N/A 10/30/2018   Procedure: PACEMAKER IMPLANT;  Surgeon: Inocencio Soyla Lunger, MD;  Location: [facility];  Service: Cardiovascular;  Laterality: N/A;   PACEMAKER INSERTION     PPM GENERATOR REMOVAL N/A 10/25/2018   Procedure: PPM GENERATOR REMOVAL;  Surgeon: Inocencio Soyla Lunger, MD;  Location: [facility];  Service: Cardiovascular;  Laterality: N/A;   PROSTATE ABLATION  2013   laser ablation   RETINAL DETACHMENT SURGERY Right 01/24/2019   Pneumatic cryopexy - Dr. Redell Hans    TEE WITHOUT CARDIOVERSION N/A 10/25/2018   Procedure: TRANSESOPHAGEAL ECHOCARDIOGRAM (TEE);  Surgeon: Alveta Aleene JINNY, MD;  Location: [facility];  Service: Cardiovascular;  Laterality: N/A;   TEMPORARY PACEMAKER N/A 10/25/2018   Procedure: TEMPORARY PACEMAKER;  Surgeon: Inocencio Soyla Lunger, MD;  Location: [facility];  Service: Cardiovascular;  Laterality: N/A;   TONSILLECTOMY  as child   TOTAL KNEE ARTHROPLASTY Right 08/03/2014   Procedure: TOTAL RIGHT KNEE ARTHROPLASTY;  Surgeon: Dempsey Melodi GAILS, MD;  Location: [facility];  Service: Orthopedics;  Laterality: Right;  TRANSURETHRAL RESECTION OF PROSTATE N/A 08/06/2012   Procedure: AMIL OF PROSTATE WITH GYRUS ;  Surgeon: Norleen JINNY Seltzer, MD;  Location: [facility];  Service: Urology;  Laterality: N/A;   UMBILICAL HERNIA REPAIR  1990   UVULECTOMY  2005   for sleep apnea   UVULOPALATOPHARYNGOPLASTY  6 years ago   Past Medical History:  Diagnosis Date   Arthritis    Basal cell carcinoma 02/17/2020   infl & ulcerated-Left forehead (MOHS)   Coronary artery disease    GERD (gastroesophageal reflux disease)    H/O bronchitis    H/O hiatal hernia    History of kidney stones last in 1976   Hyperlipidemia    statin intolerant, under control   Hypertension    under control   Hypertensive retinopathy    OU   Hypothyroidism    Moderate persistent asthma without complication 06/09/2019   Retinal detachment    OD   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   left helix tx cx3 67fu   Sleep apnea    hx of, surgery to reverse   BP 132/77   Pulse 60   Ht 6' (1.829 m)   Wt 194 lb (88 kg)   SpO2 93%   BMI 26.31 kg/m   Opioid Risk Score:   Fall Risk Score:  `1  Depression screen PHQ 2/9     05/09/2023    2:18 PM 04/04/2023    2:13 PM 01/31/2023    1:34 PM 01/22/2023    3:48 PM 12/27/2022    2:49 PM 12/18/2022    2:02 PM 10/18/2022   12:59 PM  Depression screen PHQ 2/9  Decreased Interest 0 0 0 0 0 0 0  Down, Depressed,  Hopeless 0  0 0 0 0 0  PHQ - 2 Score 0 0 0 0 0 0 0  Altered sleeping    0     Tired, decreased energy    0     Change in appetite    0     Feeling bad or failure about yourself     0     Trouble concentrating    0     Moving slowly or fidgety/restless    0     Suicidal thoughts    0     PHQ-9 Score    0     Difficult doing work/chores    Not difficult at all        Review of Systems  Respiratory:  Positive for cough.   Musculoskeletal:  Positive for neck pain.  All other systems reviewed and are negative.  HPI: [name] Glass is a 83 y.o. male with PMHx has Coronary artery disease; OA (osteoarthritis) of knee; Gastroesophageal reflux disease without esophagitis; Essential hypertension, benign; Hyperlipidemia with target LDL less than 70; Chronic renal disease, stage 3, moderately decreased glomerular filtration rate (GFR) between 30-59 mL/min/1.73 square meter (HCC); Seasonal allergic rhinitis due to pollen; Benign prostatic hyperplasia without lower urinary tract symptoms; Presence of permanent cardiac pacemaker; Sleep apnea; Hypothyroidism; Myofascial neck pain; Degenerative disc disease, cervical; Hypogonadism male; Moderate persistent asthma without complication; Chronic bronchitis, mucopurulent (HCC); Sinus node dysfunction (HCC); Thoracic aortic aneurysm (HCC); Moderate aortic stenosis; Chronic tension-type headache, not intractable; Postural kyphosis of cervicothoracic region; Cervico-occipital neuralgia of right side; Prediabetes; Muscle spasms of neck; Chronic pain syndrome; and Myalgia, shoulder region on their problem list. who presents to clinic for treatment of pain related to  myofascial pain and cervicalgia  via injection as described below.    No new concerns or complaints. GFR after last visit looked ok to continue meloxicam . Had nerve block with NSGY which went well but was told he was not a facet ablation candidate due to presence of pacemaker. Looking into ESI instead.      Physical Exam:  General: Appropriate appearance for age.  Mental Status: Appropriate mood and affect.  Cardiovascular: RRR, no m/r/g.  Respiratory: CTAB, no rales/rhonchi/wheezing.  Skin: No apparent rashes or lesions.  Neuro: Awake, alert, and oriented x3. No apparent deficits.  MSK:  Moving all 4 limbs antigravity and against resistance.  + TTP with palpable trigger points in bilateral cervical paraspinals,  R trapezius, R levator scapulae, and R infraspinatus.    PROCEDURE:  Right  trigger point injections  Diagnosis:  Myofascial neck pain - Plan: lidocaine  (XYLOCAINE ) 1 % (with pres) injection 6 mL, triamcinolone  acetonide (KENALOG -40) injection 0.8 mg      Goals with treatment: [ x ] Decrease pain [ x ] Improve Active / Passive ROM [ x ] Improve ADLs [ x ] Improve functional mobility   MEDICATION:  [ x ] Kenalog  40 mg/mL  [ X ] Lidocaine  1%      CONSENT: Obtained in writing per policy. Consent uploaded to chart.   Benefits discussed.  Risks discussed included, but were not limited to, pain and discomfort, bleeding, bruising, allergic reaction, infection. All questions answered to patient/family member/guardian/ caregiver satisfaction. They would like to proceed with procedure. There are no noted contraindications to procedure.   PROCEDURE Time out was preformed No heat sources No antibiotics   The patient was explained about both the benefits and risks of a Right  trigger point injections. After the patient acknowledged an understanding of the risks and benefits, the patient agreed to proceed. The area was first marked and then prepped in an aseptic fashion with betadine / alcohol. A 30 g, 1/2 inch needle was directed via a direct approach into the bilateral cervical paraspinals,  R trapezius, R levator scapulae, and R infraspinatus muscles. The injection was completed with Kenalog  40 mg/ml 0.2 cc mixed with 6 cc of 1% lidocaine  after no blood was aspirated on pull back.    No complications were encountered. The patient tolerated the procedure well.   Impression: HPI: [name] Glass is a 83 y.o. male with PMHx has Coronary artery disease; OA (osteoarthritis) of knee; Gastroesophageal reflux disease without esophagitis; Essential hypertension, benign; Hyperlipidemia with target LDL less than 70; Chronic renal disease, stage 3, moderately decreased glomerular filtration rate (GFR) between 30-59 mL/min/1.73 square meter (HCC); Seasonal allergic rhinitis due to pollen; Benign prostatic hyperplasia without lower urinary tract symptoms; Presence of permanent cardiac pacemaker; Sleep apnea; Hypothyroidism; Myofascial neck pain; Degenerative disc disease, cervical; Hypogonadism male; Moderate persistent asthma without complication; Chronic bronchitis, mucopurulent (HCC); Sinus node dysfunction (HCC); Thoracic aortic aneurysm (HCC); Moderate aortic stenosis; Chronic tension-type headache, not intractable; Postural kyphosis of cervicothoracic region; Cervico-occipital neuralgia of right side; Prediabetes; Muscle spasms of neck; Chronic pain syndrome; and Myalgia, shoulder region on their problem list. who presents to clinic for treatment of myofascial neck and R shoulder pain and cervicalgia. They received a  Right  trigger point injections as above.    PLAN: - Resume Usual Activities. Notify Physician of any unusual bleeding, erythema or concern for side effects as reviewed above. - Apply ice prn for pain - Tylenol  prn for pain - Follow up in 1 month for repeat injections if  benefitting - Not a surgical candicate per evaluation by Dr. Carollee; was told nerve ablations were contraindicated due to pacemaker. Will d/w Dr. Carilyn to evaluate for additional options    Patient/Care Marko was ready to learn without apparent learning barriers. Education was provided on diagnosis, treatment options/plan according to patient's preferred learning style. Patient/Care Giver verbalized  understanding and agreement with the above plan.

## 2023-06-16 ENCOUNTER — Other Ambulatory Visit: Payer: Self-pay | Admitting: Internal Medicine

## 2023-06-16 DIAGNOSIS — E039 Hypothyroidism, unspecified: Secondary | ICD-10-CM

## 2023-06-26 ENCOUNTER — Other Ambulatory Visit: Payer: Self-pay | Admitting: Internal Medicine

## 2023-07-02 ENCOUNTER — Other Ambulatory Visit (HOSPITAL_COMMUNITY): Payer: Medicare Other

## 2023-07-02 ENCOUNTER — Ambulatory Visit (INDEPENDENT_AMBULATORY_CARE_PROVIDER_SITE_OTHER): Payer: Medicare Other

## 2023-07-02 VITALS — Ht 72.0 in | Wt 194.0 lb

## 2023-07-02 DIAGNOSIS — Z0000 Encounter for general adult medical examination without abnormal findings: Secondary | ICD-10-CM | POA: Diagnosis not present

## 2023-07-02 NOTE — Patient Instructions (Signed)
[name] Glass , Thank you for taking time to come for your Medicare Wellness Visit. I appreciate your ongoing commitment to your health goals. Please review the following plan we discussed and let me know if I can assist you in the future.   Referrals/Orders/Follow-Ups/Clinician Recommendations: It was nice talking to you today.  Keep up the good work and have a very happy birthday.    This is a list of the screening recommended for you and due dates:  Health Maintenance  Topic Date Due   Flu Shot  01/04/2023   COVID-19 Vaccine (6 - 2024-25 season) 04/12/2023   Medicare Annual Wellness Visit  05/02/2023   DTaP/Tdap/Td vaccine (2 - Td or Tdap) 06/27/2027   Pneumonia Vaccine  Completed   Zoster (Shingles) Vaccine  Completed   HPV Vaccine  Aged Out    Advanced directives: (Copy Requested) Please bring a copy of your health care power of attorney and living will to the office to be added to your chart at your convenience.  Next Medicare Annual Wellness Visit scheduled for next year: Yes

## 2023-07-02 NOTE — Progress Notes (Signed)
Subjective:   [name] Glass is a 83 y.o. male who presents for Medicare Annual/Subsequent preventive examination.  Visit Complete: Virtual I connected with  [name] Glass on 07/02/23 by a audio enabled telemedicine application and verified that I am speaking with the correct person using two identifiers.  Patient Location: Home  Provider Location: Office/Clinic  I discussed the limitations of evaluation and management by telemedicine. The patient expressed understanding and agreed to proceed.  Vital Signs: Because this visit was a virtual/telehealth visit, some criteria may be missing or patient reported. Any vitals not documented were not able to be obtained and vitals that have been documented are patient reported.   Cardiac Risk Factors include: advanced age (>78men, >52 women);hypertension;Other (see comment);dyslipidemia, Risk factor comments: CAD, CRD, BPH, Thoracic aortic aneurysm     Objective:    Today's Vitals   07/02/23 0846 07/02/23 0847  Weight: 194 lb (88 kg)   Height: 6' (1.829 m)   PainSc:  3    Body mass index is 26.31 kg/m.     07/02/2023    9:46 AM 05/01/2022    2:22 PM 04/10/2022   12:04 PM 06/09/2021    1:27 PM 04/25/2021    9:55 AM 02/10/2020    2:42 PM 07/05/2019    5:07 AM  Advanced Directives  Does Patient Have a Medical Advance Directive? Yes Yes Yes Yes Yes Yes Yes  Type of Estate agent of [place];Living will Healthcare Power of [place];Living will  Living will;Healthcare Power of [place];Out of facility DNR (pink MOST or yellow form) Healthcare Power of [place];Living will Living will;Healthcare Power of [employer] Power of [place];Living will  Does patient want to make changes to medical advance directive?  No - Patient declined  No - Patient declined  No - Patient declined No - Patient declined  Copy of Healthcare Power of Attorney in Chart? No - copy requested No - copy requested  No - copy requested No - copy  requested No - copy requested No - copy requested    Current Medications (verified) Outpatient Encounter Medications as of 07/02/2023  Medication Sig   acyclovir (ZOVIRAX) 200 MG capsule TAKE 1 CAPSULE BY MOUTH THREE TIMES A DAY   anastrozole (ARIMIDEX) 1 MG tablet TAKE 1 TABLET TWICE A WEEK   ANORO ELLIPTA 62.5-25 MCG/ACT AEPB INHALE 1 PUFF BY MOUTH EVERY DAY   ARMOUR THYROID 60 MG tablet TAKE 1 TABLET (60 MG TOTAL) BY MOUTH DAILY BEFORE BREAKFAST.   cefdinir (OMNICEF) 300 MG capsule Take 1 capsule (300 mg total) by mouth 2 (two) times daily.   Cholecalciferol (VITAMIN D-3) 125 MCG (5000 UT) TABS Take 125 mcg by mouth daily.   diclofenac Sodium (VOLTAREN) 1 % GEL Apply 2 g topically 4 (four) times daily.   ezetimibe (ZETIA) 10 MG tablet TAKE 1 TABLET BY MOUTH EVERY DAY   finasteride (PROSCAR) 5 MG tablet TAKE 1 TABLET BY MOUTH EVERY DAY   fluticasone (FLONASE) 50 MCG/ACT nasal spray SPRAY 2 SPRAYS INTO EACH NOSTRIL EVERY DAY   ipratropium (ATROVENT) 0.03 % nasal spray Place 2 sprays into both nostrils 3 (three) times daily as needed for rhinitis.   levocetirizine (XYZAL) 5 MG tablet TAKE 1 TABLET BY MOUTH EVERY DAY IN THE EVENING   liothyronine (CYTOMEL) 25 MCG tablet TAKE 1 TABLET BY MOUTH EVERY DAY IN THE MORNING   meloxicam (MOBIC) 15 MG tablet Take 15 mg by mouth daily.   montelukast (SINGULAIR) 10 MG tablet TAKE 1  TABLET BY MOUTH EVERYDAY AT BEDTIME   Omega-3 Fatty Acids (FISH OIL) 1200 MG CAPS Take by mouth.   omeprazole (PRILOSEC) 20 MG capsule Take by mouth.   Pitavastatin Calcium 4 MG TABS TAKE 1 TABLET DAILY   potassium gluconate 595 (99 K) MG TABS tablet Take by mouth.   Probiotic Product (PRODIGEN) CAPS Take 1 capsule by mouth daily.   Simethicone 40 MG/0.6ML LIQD Take 0.6 mLs (40 mg total) by mouth 4 (four) times daily.   Somatropin (OMNITROPE) 5 MG/1.5ML SOCT Inject into the skin.   tamsulosin (FLOMAX) 0.4 MG CAPS capsule TAKE 1 CAPSULE BY MOUTH EVERY DAY IN THE MORNING    tiZANidine (ZANAFLEX) 4 MG tablet TAKE 0.5-1 TABLET AT BEDTIME AS NEEDED FOR MUSCLE SPASMS (FOR NECK PAIN AND TIGHTNESS).   triamcinolone cream (KENALOG) 0.1 % Apply 1 Application topically 2 (two) times daily.   hydrALAZINE (APRESOLINE) 25 MG tablet TAKE 1 TABLET BY MOUTH THREE TIMES A DAY (Patient not taking: Reported on 06/12/2023)   meloxicam (MOBIC) 7.5 MG tablet  (Patient not taking: Reported on 07/02/2023)   No facility-administered encounter medications on file as of 07/02/2023.    Allergies (verified) Morphine and codeine, Bactrim [sulfamethoxazole-trimethoprim], Morphine, Statins, Sulfamethoxazole, and Trimethoprim   History: Past Medical History:  Diagnosis Date   Arthritis    Basal cell carcinoma 02/17/2020   infl & ulcerated-Left forehead (MOHS)   Coronary artery disease    GERD (gastroesophageal reflux disease)    H/O bronchitis    H/O hiatal hernia    History of kidney stones last in 1976   Hyperlipidemia    statin intolerant, under control   Hypertension    under control   Hypertensive retinopathy    OU   Hypothyroidism    Moderate persistent asthma without complication 06/09/2019   Retinal detachment    OD   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   left helix tx cx3 18fu   Sleep apnea    hx of, surgery to reverse   Past Surgical History:  Procedure Laterality Date   CARDIAC CATHETERIZATION Right 2008   5 heart stents   CATARACT EXTRACTION Bilateral 6 years ago   CYSTOSCOPY N/A 08/06/2012   Procedure: CYSTOSCOPY;  Surgeon: Anner Crete, MD;  Location: [facility];  Service: Urology;  Laterality: N/A;   ESOPHAGOGASTRIC FUNDOPLICATION  1985   spleen removed, 5 pints of blood given   EYE SURGERY     HERNIA REPAIR  1985   x4   INGUINAL HERNIA REPAIR  1992   PACEMAKER IMPLANT N/A 10/30/2018   Procedure: PACEMAKER IMPLANT;  Surgeon: Regan Lemming, MD;  Location: [facility];  Service: Cardiovascular;  Laterality: N/A;   PACEMAKER INSERTION      PPM GENERATOR REMOVAL N/A 10/25/2018   Procedure: PPM GENERATOR REMOVAL;  Surgeon: Regan Lemming, MD;  Location: [facility];  Service: Cardiovascular;  Laterality: N/A;   PROSTATE ABLATION  2013   "laser ablation"   RETINAL DETACHMENT SURGERY Right 01/24/2019   Pneumatic cryopexy - Dr. Rennis Chris   TEE WITHOUT CARDIOVERSION N/A 10/25/2018   Procedure: TRANSESOPHAGEAL ECHOCARDIOGRAM (TEE);  Surgeon: Elease Hashimoto Deloris Ping, MD;  [facility];  Service: Cardiovascular;  Laterality: N/A;   TEMPORARY PACEMAKER N/A 10/25/2018   Procedure: TEMPORARY PACEMAKER;  Surgeon: Regan Lemming, MD;  Location: [facility];  Service: Cardiovascular;  Laterality: N/A;   TONSILLECTOMY  as child   TOTAL KNEE ARTHROPLASTY Right 08/03/2014  Procedure: TOTAL RIGHT KNEE ARTHROPLASTY;  Surgeon: Loanne Drilling, MD;  Location: [facility];  Service: Orthopedics;  Laterality: Right;   TRANSURETHRAL RESECTION OF PROSTATE N/A 08/06/2012   Procedure: Elenor Quinones OF PROSTATE WITH GYRUS ;  Surgeon: Anner Crete, MD;  Location: [facility];  Service: Urology;  Laterality: N/A;   UMBILICAL HERNIA REPAIR  1990   UVULECTOMY  2005   for sleep apnea   UVULOPALATOPHARYNGOPLASTY  6 years ago   Family History  Problem Relation Age of Onset   Heart attack Father 82       lived to 29   Alzheimer's disease Mother    Stroke Mother    Breast cancer Mother    Alzheimer's disease Maternal Grandmother    Stroke Maternal Grandmother    Lung cancer Maternal Grandmother    Lung cancer Maternal Grandfather    Cancer Paternal Grandmother        type unknown   Diabetes Paternal Grandfather    Other Paternal Grandfather        Growth in the back of the head   Social History   Socioeconomic History   Marital status: Married    Spouse name: Raynelle Fanning   Number of children: 0   Years of education: Not on file   Highest education level: Not on file  Occupational History   Occupation: Retired   Tobacco Use    Smoking status: Never   Smokeless tobacco: Never  Vaping Use   Vaping status: Never Used  Substance and Sexual Activity   Alcohol use: Yes    Alcohol/week: 4.0 standard drinks of alcohol    Types: 4 Standard drinks or equivalent per week    Comment: 3 drinks at night    Drug use: No   Sexual activity: Not Currently  Other Topics Concern   Not on file  Social History Narrative   Lives with wife   Social Drivers of Health   Financial Resource Strain: Low Risk  (07/02/2023)   Overall Financial Resource Strain (CARDIA)    Difficulty of Paying Living Expenses: Not hard at all  Food Insecurity: No Food Insecurity (07/02/2023)   Hunger Vital Sign    Worried About Running Out of Food in the Last Year: Never true    Ran Out of Food in the Last Year: Never true  Transportation Needs: No Transportation Needs (07/02/2023)   PRAPARE - Administrator, Civil Service (Medical): No    Lack of Transportation (Non-Medical): No  Physical Activity: Insufficiently Active (07/02/2023)   Exercise Vital Sign    Days of Exercise per Week: 3 days    Minutes of Exercise per Session: 30 min  Stress: No Stress Concern Present (07/02/2023)   [employer] of Occupational Health - Occupational Stress Questionnaire    Feeling of Stress : Not at all  Social Connections: Moderately Integrated (07/02/2023)   Social Connection and Isolation Panel [NHANES]    Frequency of Communication with Friends and Family: More than three times a week    Frequency of Social Gatherings with Friends and Family: More than three times a week    Attends Religious Services: Never    Database administrator or Organizations: Yes    Attends Banker Meetings: Never    Marital Status: Married    Tobacco Counseling Counseling given: Not Answered   Clinical Intake:  Pre-visit preparation completed: Yes  Pain : 0-10 Pain Score: 3  Pain Type: Chronic pain Pain Location: Neck (high  back) Pain Radiating  Towards: neck down to back Pain Descriptors / Indicators: Aching, Discomfort Pain Onset: Other (comment) Pain Frequency: Constant Pain Relieving Factors: mobic  Pain Relieving Factors: mobic  BMI - recorded: 26.31 Nutritional Status: BMI 25 -29 Overweight Nutritional Risks: None Diabetes: No  How often do you need to have someone help you when you read instructions, pamphlets, or other written materials from your doctor or pharmacy?: 1 - Never     Information entered by :: Iviona Hole, RMA   Activities of Daily Living    07/02/2023    8:47 AM  In your present state of health, do you have any difficulty performing the following activities:  Hearing? 1  Comment has some issues  Difficulty concentrating or making decisions? 0  Walking or climbing stairs? 0  Dressing or bathing? 0  Doing errands, shopping? 0  Preparing Food and eating ? N  Using the Toilet? N  In the past six months, have you accidently leaked urine? Y  Do you have problems with loss of bowel control? N  Managing your Medications? N  Managing your Finances? N  Housekeeping or managing your Housekeeping? N    Patient Care Team: Etta Grandchild, MD as PCP - General (Internal Medicine) Runell Gess, MD as PCP - Cardiology (Cardiology) Janalyn Harder, MD (Inactive) as Consulting Physician (Dermatology)  Indicate any recent Medical Services you may have received from other than [facility] providers in the past year (date may be approximate).     Assessment:   This is a routine wellness examination for Darik.  Hearing/Vision screen Hearing Screening - Comments:: Some hearing loss Vision Screening - Comments:: Denies vision issues.    Goals Addressed             This Visit's Progress    Patient Stated   On track    Maintain current health status.       Depression Screen    07/02/2023    9:54 AM 05/09/2023    2:18 PM 04/04/2023    2:13 PM 01/31/2023    1:34 PM 01/22/2023    3:48 PM  12/27/2022    2:49 PM 12/18/2022    2:02 PM  PHQ 2/9 Scores  PHQ - 2 Score 0 0 0 0 0 0 0  PHQ- 9 Score 0    0      Fall Risk    07/02/2023    9:46 AM 06/13/2023    3:15 PM 05/09/2023    2:18 PM 04/04/2023    2:13 PM 01/31/2023    1:34 PM  Fall Risk   Falls in the past year? 0 0 0 0 0  Number falls in past yr: 0  0 0 0  Injury with Fall? 0   0 0  Risk for fall due to : No Fall Risks      Follow up Falls prevention discussed;Falls evaluation completed        MEDICARE RISK AT HOME: Medicare Risk at Home Any stairs in or around the home?: Yes If so, are there any without handrails?: Yes Home free of loose throw rugs in walkways, pet beds, electrical cords, etc?: Yes Adequate lighting in your home to reduce risk of falls?: Yes Life alert?: No Use of a cane, walker or w/c?: No Grab bars in the bathroom?: No Shower chair or bench in shower?: Yes Elevated toilet seat or a handicapped toilet?: Yes  TIMED UP AND GO:  Was the test performed?  No  Cognitive Function:    05/01/2022    2:23 PM  MMSE - Mini Mental State Exam  Not completed: Refused        07/02/2023    8:48 AM 02/10/2020    2:44 PM  6CIT Screen  What Year? 0 points 0 points  What month? 0 points 0 points  What time? 0 points 0 points  Count back from 20 0 points 0 points  Months in reverse 0 points 0 points  Repeat phrase 0 points 0 points  Total Score 0 points 0 points    Immunizations Immunization History  Administered Date(s) Administered   Fluad Quad(high Dose 65+) 04/01/2019, 03/22/2020   Influenza, High Dose Seasonal PF 07/13/2016, 04/19/2017   Influenza-Unspecified 05/04/2021   PFIZER(Purple Top)SARS-COV-2 Vaccination 06/24/2019, 07/15/2019   Pfizer Covid-19 Vaccine Bivalent Booster 5y-11y 05/04/2021   Pfizer(Comirnaty)Fall Seasonal Vaccine 12 years and older 04/25/2022, 02/15/2023   Pneumococcal Conjugate-13 06/26/2017   Pneumococcal Polysaccharide-23 12/16/2015, 01/17/2022   Respiratory  Syncytial Virus Vaccine,Recomb Aduvanted(Arexvy) 05/10/2022   Tdap 06/26/2017   Zoster Recombinant(Shingrix) 06/14/2019, 10/20/2019    TDAP status: Up to date  Flu Vaccine status: Up to date  Pneumococcal vaccine status: Up to date  Covid-19 vaccine status: Completed vaccines  Qualifies for Shingles Vaccine? Yes   Zostavax completed Yes   Shingrix Completed?: Yes  Screening Tests Health Maintenance  Topic Date Due   INFLUENZA VACCINE  01/04/2023   COVID-19 Vaccine (6 - 2024-25 season) 04/12/2023   Medicare Annual Wellness (AWV)  07/01/2024   DTaP/Tdap/Td (2 - Td or Tdap) 06/27/2027   Pneumonia Vaccine 46+ Years old  Completed   Zoster Vaccines- Shingrix  Completed   HPV VACCINES  Aged Out    Health Maintenance  Health Maintenance Due  Topic Date Due   INFLUENZA VACCINE  01/04/2023   COVID-19 Vaccine (6 - 2024-25 season) 04/12/2023    Colorectal cancer screening: No longer required.   Lung Cancer Screening: (Low Dose CT Chest recommended if Age 12-80 years, 20 pack-year currently smoking OR have quit w/in 15years.) does not qualify.   Lung Cancer Screening Referral: N/A  Additional Screening:  Hepatitis C Screening: does not qualify;  Vision Screening: Recommended annual ophthalmology exams for early detection of glaucoma and other disorders of the eye. Is the patient up to date with their annual eye exam?  Yes  Who is the provider or what is the name of the office in which the patient attends annual eye exams? Dr, Gwendalyn Ege If pt is not established with a provider, would they like to be referred to a provider to establish care? No .   Dental Screening: Recommended annual dental exams for proper oral hygiene   Community Resource Referral / Chronic Care Management: CRR required this visit?  No   CCM required this visit?  No     Plan:     I have personally reviewed and noted the following in the patient's chart:   Medical and social history Use of  alcohol, tobacco or illicit drugs  Current medications and supplements including opioid prescriptions. Patient is not currently taking opioid prescriptions. Functional ability and status Nutritional status Physical activity Advanced directives List of other physicians Hospitalizations, surgeries, and ER visits in previous 12 months Vitals Screenings to include cognitive, depression, and falls Referrals and appointments  In addition, I have reviewed and discussed with patient certain preventive protocols, quality metrics, and best practice recommendations. A written personalized care plan for preventive services as well as general preventive health  recommendations were provided to patient.     Lizzet Hendley L Gussie Murton, CMA   07/02/2023   After Visit Summary: (MyChart) Due to this being a telephonic visit, the after visit summary with patients personalized plan was offered to patient via MyChart   Nurse Notes: Patient stated that he had a Flu vaccine given by his pharmacy this season, however it is not documented in the [place]  He had no other concerns to address today.

## 2023-07-03 ENCOUNTER — Other Ambulatory Visit: Payer: Self-pay | Admitting: Internal Medicine

## 2023-07-09 DIAGNOSIS — L570 Actinic keratosis: Secondary | ICD-10-CM | POA: Diagnosis not present

## 2023-07-09 DIAGNOSIS — L905 Scar conditions and fibrosis of skin: Secondary | ICD-10-CM | POA: Diagnosis not present

## 2023-07-09 DIAGNOSIS — L720 Epidermal cyst: Secondary | ICD-10-CM | POA: Diagnosis not present

## 2023-07-09 DIAGNOSIS — D485 Neoplasm of uncertain behavior of skin: Secondary | ICD-10-CM | POA: Diagnosis not present

## 2023-07-09 DIAGNOSIS — C4442 Squamous cell carcinoma of skin of scalp and neck: Secondary | ICD-10-CM | POA: Diagnosis not present

## 2023-07-09 DIAGNOSIS — Z85820 Personal history of malignant melanoma of skin: Secondary | ICD-10-CM | POA: Diagnosis not present

## 2023-07-16 ENCOUNTER — Encounter
Payer: Medicare Other | Attending: Physical Medicine and Rehabilitation | Admitting: Physical Medicine and Rehabilitation

## 2023-07-16 VITALS — BP 135/85 | HR 60 | Ht 72.0 in | Wt 199.0 lb

## 2023-07-16 DIAGNOSIS — E785 Hyperlipidemia, unspecified: Secondary | ICD-10-CM | POA: Diagnosis not present

## 2023-07-16 DIAGNOSIS — I350 Nonrheumatic aortic (valve) stenosis: Secondary | ICD-10-CM | POA: Diagnosis not present

## 2023-07-16 DIAGNOSIS — I950 Idiopathic hypotension: Secondary | ICD-10-CM | POA: Diagnosis not present

## 2023-07-16 DIAGNOSIS — M542 Cervicalgia: Secondary | ICD-10-CM | POA: Diagnosis not present

## 2023-07-16 DIAGNOSIS — J4540 Moderate persistent asthma, uncomplicated: Secondary | ICD-10-CM | POA: Insufficient documentation

## 2023-07-16 DIAGNOSIS — R7303 Prediabetes: Secondary | ICD-10-CM | POA: Insufficient documentation

## 2023-07-16 DIAGNOSIS — J4489 Other specified chronic obstructive pulmonary disease: Secondary | ICD-10-CM | POA: Insufficient documentation

## 2023-07-16 DIAGNOSIS — I712 Thoracic aortic aneurysm, without rupture, unspecified: Secondary | ICD-10-CM | POA: Insufficient documentation

## 2023-07-16 DIAGNOSIS — G894 Chronic pain syndrome: Secondary | ICD-10-CM | POA: Insufficient documentation

## 2023-07-16 DIAGNOSIS — E039 Hypothyroidism, unspecified: Secondary | ICD-10-CM | POA: Insufficient documentation

## 2023-07-16 DIAGNOSIS — I2510 Atherosclerotic heart disease of native coronary artery without angina pectoris: Secondary | ICD-10-CM | POA: Diagnosis not present

## 2023-07-16 DIAGNOSIS — M62838 Other muscle spasm: Secondary | ICD-10-CM

## 2023-07-16 DIAGNOSIS — Z950 Presence of cardiac pacemaker: Secondary | ICD-10-CM | POA: Insufficient documentation

## 2023-07-16 DIAGNOSIS — N183 Chronic kidney disease, stage 3 unspecified: Secondary | ICD-10-CM | POA: Insufficient documentation

## 2023-07-16 DIAGNOSIS — K219 Gastro-esophageal reflux disease without esophagitis: Secondary | ICD-10-CM | POA: Diagnosis not present

## 2023-07-16 DIAGNOSIS — M5481 Occipital neuralgia: Secondary | ICD-10-CM

## 2023-07-16 DIAGNOSIS — G4730 Sleep apnea, unspecified: Secondary | ICD-10-CM | POA: Insufficient documentation

## 2023-07-16 DIAGNOSIS — N400 Enlarged prostate without lower urinary tract symptoms: Secondary | ICD-10-CM | POA: Insufficient documentation

## 2023-07-16 DIAGNOSIS — I129 Hypertensive chronic kidney disease with stage 1 through stage 4 chronic kidney disease, or unspecified chronic kidney disease: Secondary | ICD-10-CM | POA: Insufficient documentation

## 2023-07-16 MED ORDER — LIDOCAINE HCL 1 % IJ SOLN
6.0000 mL | Freq: Once | INTRAMUSCULAR | Status: AC
  Administered 2023-07-16: 6 mL

## 2023-07-16 MED ORDER — TRIAMCINOLONE ACETONIDE 40 MG/ML IJ SUSP
5.0000 mg | Freq: Once | INTRAMUSCULAR | Status: AC
  Administered 2023-07-16: 5.2 mg via INTRAMUSCULAR

## 2023-07-16 NOTE — Progress Notes (Signed)
HPI: [name] Glass is a 83 y.o. 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 who presents to clinic for treatment of pain related to chronic Right  sided headaches and cervicalgia  via injection as described below.   No new concerns or complaints. No major changes in medical history since last visit.   Physical Exam:  General: Appropriate appearance for age.  Mental Status: Appropriate mood and affect.  Cardiovascular: RRR, no m/r/g.  Respiratory: CTAB, no rales/rhonchi/wheezing.  Skin: No apparent rashes or lesions.  Neuro: Awake, alert, and oriented x3. No apparent deficits.  MSK: Moving all 4 limbs antigravity and against resistance.  + TTP with palpable trigger points in bilateral cervical paraspinals,  R trapezius, R levator scapulae, and R infraspinatus.      PROCEDURE:  Right  greater occipital nerve block and bilateral trigger point injections   ICD-10-CM   1. Myofascial neck pain  M54.2      2. Muscle spasms of neck  M62.838     3. Cervico-occipital neuralgia of right side  M54.81       Goals with treatment: [ x ] Decrease pain [ x ] Improve Active / Passive ROM [ x ] Improve ADLs [  ] Improve functional mobility  MEDICATION:  [ x ] Kenalog 40 mg/mL  [ X ] Lidocaine 1%    CONSENT: Obtained in writing per policy. Consent uploaded to chart.  Benefits discussed.  Risks discussed included, but were not limited to, pain and discomfort, bleeding, bruising, allergic reaction, infection. All questions answered to patient/family member/guardian/ caregiver satisfaction. They would like to proceed with procedure. There are no noted contraindications to procedure.  PROCEDURE Time out was preformed No heat sources No antibiotics  The patient was explained about both the benefits and risks of a  Right  greater occipital nerve block and bilateral trigger point injections as above. After the patient acknowledged an understanding of the risks and benefits, the patient agreed to proceed. The area was first marked and then prepped in an aseptic fashion with betadine / alcohol. A 30 g, 1/2 inch needle was directed via a direct approach into the   bilateral cervical paraspinals,  R trapezius, R levator scapulae, and R infraspinatus. The injection was completed with Kenalog 40 mg/ml 1 cc mixed with 2 cc of 1% lidocaine after no blood was aspirated on pull back. Then, another syringe of the same mixture was directed 1/3 between the inion and the Right  mastoid bone, just medial to the occipital artery, and after no blood on drawback 3 cc was injected.  No complications were encountered. The patient tolerated the procedure well.  Impression: HPI: [name] Glass is a 83 y.o. male with PMHx has Coronary artery disease; OA (osteoarthritis) of knee; Gastroesophageal reflux  disease without esophagitis; Essential hypertension, benign; Hyperlipidemia with target LDL less than 70; Chronic renal  disease, stage 3, moderately decreased glomerular filtration rate (GFR) between 30-59 mL/min/1.73 square meter (HCC); Seasonal allergic rhinitis due to pollen; Benign prostatic hyperplasia without lower urinary tract symptoms; Presence of permanent cardiac pacemaker; Sleep apnea; Hypothyroidism; Myofascial neck pain; Degenerative disc disease, cervical; Hypogonadism male; Moderate persistent asthma without complication; Chronic bronchitis, mucopurulent (HCC); Sinus node dysfunction (HCC); Thoracic aortic aneurysm (HCC); Moderate aortic stenosis; Chronic tension-type headache, not intractable; Postural kyphosis of cervicothoracic region; Cervico-occipital neuralgia of right side; Prediabetes; Chronic pain syndrome; Myalgia, shoulder region; Acute cough; Abnormal EKG; Idiopathic hypotension; and Muscle spasms of neck on their problem list. who presents to clinic for treatment of R cervicalgia and headache pain . They received a  Right  greater occipital nerve block and bilateral trigger point injections as above.  PLAN: - Resume Usual Activities. Notify Physician of any unusual bleeding, erythema or concern for side effects as reviewed above. - Apply ice prn for pain - Tylenol prn for pain - Follow up PRN

## 2023-07-16 NOTE — Patient Instructions (Signed)
-   Resume Usual Activities. Notify Physician of any unusual bleeding, erythema or concern for side effects as reviewed above. - Apply ice prn for pain - Tylenol prn for pain - Follow up in 1 month for repeat injections if benefitting

## 2023-07-21 ENCOUNTER — Other Ambulatory Visit: Payer: Self-pay | Admitting: Internal Medicine

## 2023-07-21 DIAGNOSIS — E785 Hyperlipidemia, unspecified: Secondary | ICD-10-CM

## 2023-07-21 DIAGNOSIS — N400 Enlarged prostate without lower urinary tract symptoms: Secondary | ICD-10-CM

## 2023-07-24 ENCOUNTER — Other Ambulatory Visit: Payer: Self-pay | Admitting: Internal Medicine

## 2023-07-24 ENCOUNTER — Encounter: Payer: Self-pay | Admitting: Internal Medicine

## 2023-07-24 DIAGNOSIS — J208 Acute bronchitis due to other specified organisms: Secondary | ICD-10-CM

## 2023-07-24 DIAGNOSIS — E039 Hypothyroidism, unspecified: Secondary | ICD-10-CM

## 2023-07-24 MED ORDER — NIRMATRELVIR/RITONAVIR (PAXLOVID) TABLET (RENAL DOSING): 2.0000 | ORAL_TABLET | Freq: Two times a day (BID) | ORAL | 0 refills | Status: AC

## 2023-07-25 ENCOUNTER — Ambulatory Visit (INDEPENDENT_AMBULATORY_CARE_PROVIDER_SITE_OTHER): Payer: Medicare Other

## 2023-07-25 DIAGNOSIS — I495 Sick sinus syndrome: Secondary | ICD-10-CM

## 2023-07-25 LAB — CUP PACEART REMOTE DEVICE CHECK
Battery Remaining Longevity: 121 mo
Battery Voltage: 3.01 V
Brady Statistic AP VP Percent: 2.91 %
Brady Statistic AP VS Percent: 21.13 %
Brady Statistic AS VP Percent: 2.05 %
Brady Statistic AS VS Percent: 73.92 %
Brady Statistic RA Percent Paced: 24.05 %
Brady Statistic RV Percent Paced: 4.95 %
Date Time Interrogation Session: 20250218203749
Implantable Lead Connection Status: 753985
Implantable Lead Connection Status: 753985
Implantable Lead Implant Date: 20200527
Implantable Lead Implant Date: 20200527
Implantable Lead Location: 753859
Implantable Lead Location: 753860
Implantable Lead Model: 5076
Implantable Lead Model: 5076
Implantable Pulse Generator Implant Date: 20200527
Lead Channel Impedance Value: 342 Ohm
Lead Channel Impedance Value: 437 Ohm
Lead Channel Impedance Value: 532 Ohm
Lead Channel Impedance Value: 570 Ohm
Lead Channel Pacing Threshold Amplitude: 0.625 V
Lead Channel Pacing Threshold Amplitude: 0.625 V
Lead Channel Pacing Threshold Pulse Width: 0.4 ms
Lead Channel Pacing Threshold Pulse Width: 0.4 ms
Lead Channel Sensing Intrinsic Amplitude: 3 mV
Lead Channel Sensing Intrinsic Amplitude: 3 mV
Lead Channel Sensing Intrinsic Amplitude: 6.125 mV
Lead Channel Sensing Intrinsic Amplitude: 6.125 mV
Lead Channel Setting Pacing Amplitude: 1.5 V
Lead Channel Setting Pacing Amplitude: 2.5 V
Lead Channel Setting Pacing Pulse Width: 0.4 ms
Lead Channel Setting Sensing Sensitivity: 2 mV
Zone Setting Status: 755011
Zone Setting Status: 755011

## 2023-07-30 DIAGNOSIS — M542 Cervicalgia: Secondary | ICD-10-CM | POA: Diagnosis not present

## 2023-08-07 DIAGNOSIS — H00021 Hordeolum internum right upper eyelid: Secondary | ICD-10-CM | POA: Diagnosis not present

## 2023-08-10 ENCOUNTER — Other Ambulatory Visit: Payer: Self-pay | Admitting: Internal Medicine

## 2023-08-13 ENCOUNTER — Encounter
Payer: Medicare Other | Attending: Physical Medicine and Rehabilitation | Admitting: Physical Medicine and Rehabilitation

## 2023-08-13 VITALS — BP 116/69 | HR 70 | Ht 72.0 in | Wt 193.0 lb

## 2023-08-13 DIAGNOSIS — M62838 Other muscle spasm: Secondary | ICD-10-CM | POA: Diagnosis not present

## 2023-08-13 DIAGNOSIS — M5481 Occipital neuralgia: Secondary | ICD-10-CM | POA: Insufficient documentation

## 2023-08-13 DIAGNOSIS — M542 Cervicalgia: Secondary | ICD-10-CM | POA: Diagnosis not present

## 2023-08-13 MED ORDER — LIDOCAINE HCL 1 % IJ SOLN
6.0000 mL | Freq: Once | INTRAMUSCULAR | Status: AC
  Administered 2023-08-13: 6 mL

## 2023-08-13 MED ORDER — TRIAMCINOLONE ACETONIDE 40 MG/ML IJ SUSP
5.0000 mg | Freq: Once | INTRAMUSCULAR | Status: AC
  Administered 2023-08-13: 5.2 mg via INTRAMUSCULAR

## 2023-08-13 NOTE — Progress Notes (Signed)
 HPI: [name] Glass is a 83 y.o. 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 who presents to clinic for treatment of pain related to chronic Right  sided headaches and cervicalgia  via injection as described below.    No new concerns or complaints. No major changes in medical history since last visit.   Saw [facility] 07/30/23: [name] Glass presents to the clinic today after last being seen on June 12, 2023 for bilateral C3 through 4 facet injections. Ultimately the patient had previously had facet injections with Dr. Ollen Bowl. These were not beneficial. He states today that the injections were once again ineffective. He reports maybe a 30% improvement for a day. The patient also had bilateral C3 through 4 medial branch block in December of 2024. Unfortunately does not feel these provided any drastic improvement. Today he states that he did not receive 80% improvement. He received mild  improvement for again about a day. He does not wish to pursue repeating the medial branch block. He is aware that we cannot proceed with the radiofrequency ablation procedure due to his pacemaker. This would need to be done at a facility that can do cardiac monitoring. Dr. Lorrine Kin suggested considering [facility]. However the patient feels that he did not get enough benefit from either procedure to pursue to the ablation procedure. He states he has already done physical therapy in the not helpful. He is again reporting axial cervical pain. Actually improved with extension of the neck. He gets relief by lying down extending his neck. Flexion make the pain worse. He is not reporting any radicular pain. He rates his pain a 3/10 today.   Cervicalgia We discussed considering repeating the medial branch block if adjusted by Dr. Lorrine Kin. He does not wish to do this. We also discussed considering the ablation procedure at another facility such as [facility]. He does not wish to pursue that either.He received medications from his primary care physician.He declined proceeding physical therapy.He would like to follow back up with Dr. Jake Samples. He is aware that he may be unavailable. We may have to transition his care to a different neurosurgeon.     Physical Exam:  General: Appropriate appearance for age.  Mental Status: Appropriate mood and affect.  Cardiovascular: RRR, no m/r/g.  Respiratory: CTAB, no rales/rhonchi/wheezing.  Skin: No apparent rashes or lesions.  Neuro: Awake, alert, and oriented x3. No apparent deficits.  MSK: Moving all 4 limbs antigravity and against resistance.  + TTP with palpable trigger points in R cervical paraspinals,  R trapezius, R levator scapulae, and R infraspinatus.       PROCEDURE:  Right  greater occipital nerve block and bilateral trigger point injections     ICD-10-CM    1. Myofascial neck pain  M54.2  2. Muscle spasms of neck  M62.838       3. Cervico-occipital neuralgia of  right side  M54.81         Goals with treatment: [ x ] Decrease pain [ x ] Improve Active / Passive ROM [ x ] Improve ADLs [  ] Improve functional mobility   MEDICATION:  [ x ] Kenalog 40 mg/mL  [ X ] Lidocaine 1%      CONSENT: Obtained in writing per policy. Consent uploaded to chart.   Benefits discussed.  Risks discussed included, but were not limited to, pain and discomfort, bleeding, bruising, allergic reaction, infection. All questions answered to patient/family member/guardian/ caregiver satisfaction. They would like to proceed with procedure. There are no noted contraindications to procedure.   PROCEDURE Time out was preformed No heat sources No antibiotics   The patient was explained about both the benefits and risks of a  Right  greater occipital nerve block and bilateral trigger point injections as above. After the patient acknowledged an understanding of the risks and benefits, the patient agreed to proceed. The area was first marked and then prepped in an aseptic fashion with betadine / alcohol. A 30 g, 1/2 inch needle was directed via a direct approach into the   R cervical paraspinals,  R trapezius, R levator scapulae, and R infraspinatus.. The injection was completed with Kenalog 40 mg/ml 1 cc mixed with 2 cc of 1% lidocaine after no blood was aspirated on pull back. Then, another syringe of the same mixture was directed 1/3 between the inion and the Right  mastoid bone, just medial to the occipital artery, and after no blood on drawback 3 cc was injected.   No complications were encountered. The patient tolerated the procedure well.   Impression: HPI: [name] Glass is a 83 y.o. male with PMHx has Coronary artery disease; OA (osteoarthritis) of knee; Gastroesophageal reflux disease without esophagitis; Essential hypertension, benign; Hyperlipidemia with target LDL less than 70; Chronic renal disease, stage 3, moderately decreased glomerular filtration rate (GFR) between  30-59 mL/min/1.73 square meter (HCC); Seasonal allergic rhinitis due to pollen; Benign prostatic hyperplasia without lower urinary tract symptoms; Presence of permanent cardiac pacemaker; Sleep apnea; Hypothyroidism; Myofascial neck pain; Degenerative disc disease, cervical; Hypogonadism male; Moderate persistent asthma without complication; Chronic bronchitis, mucopurulent (HCC); Sinus node dysfunction (HCC); Thoracic aortic aneurysm (HCC); Moderate aortic stenosis; Chronic tension-type headache, not intractable; Postural kyphosis of cervicothoracic region; Cervico-occipital neuralgia of right side; Prediabetes; Chronic pain syndrome; Myalgia, shoulder region; Acute cough; Abnormal EKG; Idiopathic hypotension; and Muscle spasms of neck on their problem list. who presents to clinic for treatment of R cervicalgia and headache pain . They received a  Right  greater occipital nerve block and bilateral trigger point injections as above.   PLAN: - Resume Usual Activities. Notify Physician of any unusual bleeding, erythema or concern for side effects as reviewed above. - Apply ice prn for pain - Tylenol and tramadol prn for pain - Follow up PRN  - Has not had significant benefit from MBB with Neurosurgery; they have receommended referral to [facility] for RFA but he does not feel blocks were beneficial enough to warrant the procedure. Feel this is reasonable given his results.   - He is asking about ESI with Dr. Lorrine Kin in December; he has had these before without benefit. 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

## 2023-08-21 ENCOUNTER — Encounter: Payer: Self-pay | Admitting: Internal Medicine

## 2023-08-22 ENCOUNTER — Other Ambulatory Visit: Payer: Self-pay | Admitting: Internal Medicine

## 2023-08-22 DIAGNOSIS — E291 Testicular hypofunction: Secondary | ICD-10-CM

## 2023-08-29 DIAGNOSIS — Z6826 Body mass index (BMI) 26.0-26.9, adult: Secondary | ICD-10-CM | POA: Diagnosis not present

## 2023-08-29 DIAGNOSIS — M542 Cervicalgia: Secondary | ICD-10-CM | POA: Diagnosis not present

## 2023-08-29 DIAGNOSIS — M47812 Spondylosis without myelopathy or radiculopathy, cervical region: Secondary | ICD-10-CM | POA: Diagnosis not present

## 2023-08-30 NOTE — Progress Notes (Signed)
 Remote pacemaker transmission.

## 2023-09-10 ENCOUNTER — Encounter: Attending: Physical Medicine and Rehabilitation | Admitting: Physical Medicine and Rehabilitation

## 2023-09-10 ENCOUNTER — Encounter: Payer: Self-pay | Admitting: Physical Medicine and Rehabilitation

## 2023-09-10 VITALS — BP 132/64 | HR 62 | Ht 72.0 in | Wt 194.4 lb

## 2023-09-10 DIAGNOSIS — M542 Cervicalgia: Secondary | ICD-10-CM | POA: Diagnosis not present

## 2023-09-10 DIAGNOSIS — M5481 Occipital neuralgia: Secondary | ICD-10-CM | POA: Insufficient documentation

## 2023-09-10 MED ORDER — LIDOCAINE HCL 1 % IJ SOLN
6.0000 mL | Freq: Once | INTRAMUSCULAR | Status: AC
  Administered 2023-09-10: 6 mL

## 2023-09-10 MED ORDER — TRIAMCINOLONE ACETONIDE 40 MG/ML IJ SUSP
0.8000 mg | Freq: Once | INTRAMUSCULAR | Status: AC
  Administered 2023-09-10: 0.8 mg via INTRAMUSCULAR

## 2023-09-10 NOTE — Progress Notes (Signed)
 HPI: [name] Glass is a 83 y.o. 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 who presents to clinic for treatment of pain related to chronic Right  sided headaches and cervicalgia  via injection as described below.    No new concerns or complaints. No major changes in medical history since last visit.      Physical Exam:  General: Appropriate appearance for age.  Mental Status: Appropriate mood and affect.  Cardiovascular: RRR, no m/r/g.  Respiratory: CTAB, no rales/rhonchi/wheezing.  Skin: No apparent rashes or lesions.  Neuro: Awake, alert, and oriented x3. No apparent deficits.  MSK: Moving all 4 limbs antigravity and against resistance.  + TTP with palpable trigger points in R cervical paraspinals,  R trapezius, R levator scapulae, and R infraspinatus.       PROCEDURE:  Right  greater occipital nerve block and bilateral trigger point injections     ICD-10-CM    1. Myofascial neck pain  M54.2        2. Muscle spasms of neck  M62.838       3. Cervico-occipital neuralgia of right side  M54.81         Goals with treatment: [ x ] Decrease pain [ x ] Improve Active / Passive ROM [ x ] Improve ADLs [  ] Improve functional mobility   MEDICATION:  [ x ] Kenalog 40 mg/mL  [ X ] Lidocaine 1%      CONSENT: Obtained in writing per policy. Consent uploaded to chart.   Benefits discussed.  Risks discussed included, but were not limited to, pain and discomfort, bleeding, bruising, allergic reaction, infection. All questions answered to patient/family member/guardian/ caregiver satisfaction. They would like to proceed with procedure. There are no noted contraindications to procedure.   PROCEDURE Time out was preformed No heat sources No antibiotics   The patient was explained about both the benefits and risks of a  Right  greater occipital nerve block and bilateral trigger point injections as above. After the patient acknowledged an understanding of the risks and benefits, the patient agreed to proceed. The area was first marked and then prepped in an aseptic fashion with betadine / alcohol. A 30 g, 1/2 inch needle was directed via a direct approach into the   R cervical paraspinals,  R trapezius, R levator scapulae, and R infraspinatus.. The injection was completed with Kenalog 40 mg/ml 1 cc mixed with 2 cc of 1% lidocaine after no blood was aspirated on pull back. Then, another syringe of the same mixture was directed 1/3 between the inion and the Right  mastoid bone, just medial to the occipital artery, and after no blood on drawback 3 cc was injected.   No complications were encountered. The patient tolerated the procedure well.  Impression: HPI: [name] Glass is a 83 y.o. male with PMHx has Coronary artery disease; OA (osteoarthritis) of knee; Gastroesophageal reflux disease without esophagitis; Essential hypertension, benign; Hyperlipidemia with target LDL less than 70; Chronic  renal disease, stage 3, moderately decreased glomerular filtration rate (GFR) between 30-59 mL/min/1.73 square meter (HCC); Seasonal allergic rhinitis due to pollen; Benign prostatic hyperplasia without lower urinary tract symptoms; Presence of permanent cardiac pacemaker; Sleep apnea; Hypothyroidism; Myofascial neck pain; Degenerative disc disease, cervical; Hypogonadism male; Moderate persistent asthma without complication; Chronic bronchitis, mucopurulent (HCC); Sinus node dysfunction (HCC); Thoracic aortic aneurysm (HCC); Moderate aortic stenosis; Chronic tension-type headache, not intractable; Postural kyphosis of cervicothoracic region; Cervico-occipital neuralgia of right side; Prediabetes; Chronic pain syndrome; Myalgia, shoulder region; Acute cough; Abnormal EKG; Idiopathic hypotension; and Muscle spasms of neck on their problem list. who presents to clinic for treatment of R cervicalgia and headache pain . They received a  Right  greater occipital nerve block and bilateral trigger point injections as above.   PLAN: - Resume Usual Activities. Notify Physician of any unusual bleeding, erythema or concern for side effects as reviewed above. - Apply ice prn for pain - Tylenol and tramadol prn for pain - Follow up PRN Q1M for TPI and GONB - Getting dry needling from [facility] in early May; discussed changing schedule for TPI if this benefits him although he has not had as much success with them in the past

## 2023-09-16 ENCOUNTER — Other Ambulatory Visit: Payer: Self-pay | Admitting: Internal Medicine

## 2023-09-16 DIAGNOSIS — E039 Hypothyroidism, unspecified: Secondary | ICD-10-CM

## 2023-09-18 NOTE — Addendum Note (Signed)
 Addended by: Veera Stapleton M on: 09/18/2023 03:45 PM   Modules accepted: Orders

## 2023-10-03 ENCOUNTER — Other Ambulatory Visit: Payer: Self-pay | Admitting: Internal Medicine

## 2023-10-05 ENCOUNTER — Other Ambulatory Visit: Payer: Self-pay

## 2023-10-05 ENCOUNTER — Ambulatory Visit (HOSPITAL_BASED_OUTPATIENT_CLINIC_OR_DEPARTMENT_OTHER): Attending: Neurological Surgery | Admitting: Physical Therapy

## 2023-10-05 ENCOUNTER — Other Ambulatory Visit: Payer: Self-pay | Admitting: Internal Medicine

## 2023-10-05 DIAGNOSIS — R293 Abnormal posture: Secondary | ICD-10-CM | POA: Diagnosis not present

## 2023-10-05 DIAGNOSIS — M6281 Muscle weakness (generalized): Secondary | ICD-10-CM | POA: Insufficient documentation

## 2023-10-05 DIAGNOSIS — M542 Cervicalgia: Secondary | ICD-10-CM | POA: Diagnosis not present

## 2023-10-05 DIAGNOSIS — R2689 Other abnormalities of gait and mobility: Secondary | ICD-10-CM | POA: Diagnosis not present

## 2023-10-05 NOTE — Therapy (Signed)
 OUTPATIENT PHYSICAL THERAPY CERVICAL EVALUATION   Patient Name: [name] Glass MRN: [id number] DOB:13-Nov-1940, 83 y.o., male Today's Date: 10/05/2023  END OF SESSION:  PT End of Session - 10/05/23 1350     Visit Number 1    Number of Visits 12    Date for PT Re-Evaluation 11/16/23    Authorization Type Medicare    Progress Note Due on Visit 10    PT Start Time 1350    PT Stop Time 1430    PT Time Calculation (min) 40 min             Past Medical History:  Diagnosis Date   Arthritis    Basal cell carcinoma 02/17/2020   infl & ulcerated-Left forehead (MOHS)   Coronary artery disease    GERD (gastroesophageal reflux disease)    H/O bronchitis    H/O hiatal hernia    History of kidney stones last in 1976   Hyperlipidemia    statin intolerant, under control   Hypertension    under control   Hypertensive retinopathy    OU   Hypothyroidism    Moderate persistent asthma without complication 06/09/2019   Retinal detachment    OD   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   left helix tx cx3 45fu   Sleep apnea    hx of, surgery to reverse   Past Surgical History:  Procedure Laterality Date   CARDIAC CATHETERIZATION Right 2008   5 heart stents   CATARACT EXTRACTION Bilateral 6 years ago   CYSTOSCOPY N/A 08/06/2012   Procedure: CYSTOSCOPY;  Surgeon: Willye Harvey, MD;  Location: [facility];  Service: Urology;  Laterality: N/A;   ESOPHAGOGASTRIC FUNDOPLICATION  1985   spleen removed, 5 pints of blood given   EYE SURGERY     HERNIA REPAIR  1985   x4   INGUINAL HERNIA REPAIR  1992   PACEMAKER IMPLANT N/A 10/30/2018   Procedure: PACEMAKER IMPLANT;  Surgeon: Lei Pump, MD;  Location: [facility];  Service: Cardiovascular;  Laterality: N/A;   PACEMAKER INSERTION     PPM GENERATOR REMOVAL N/A 10/25/2018   Procedure: PPM GENERATOR REMOVAL;  Surgeon: Lei Pump, MD;  Location: [facility];  Service: Cardiovascular;  Laterality: N/A;    PROSTATE ABLATION  2013   "laser ablation"   RETINAL DETACHMENT SURGERY Right 01/24/2019   Pneumatic cryopexy - Dr. Ronelle Coffee   TEE WITHOUT CARDIOVERSION N/A 10/25/2018   Procedure: TRANSESOPHAGEAL ECHOCARDIOGRAM (TEE);  Surgeon: Alroy Aspen Lela Purple, MD;  Location: [facility];  Service: Cardiovascular;  Laterality: N/A;   TEMPORARY PACEMAKER N/A 10/25/2018   Procedure: TEMPORARY PACEMAKER;  Surgeon: Lei Pump, MD;  Location: [facility];  Service: Cardiovascular;  Laterality: N/A;   TONSILLECTOMY  as child   TOTAL KNEE ARTHROPLASTY Right 08/03/2014   Procedure: TOTAL RIGHT KNEE ARTHROPLASTY;  Surgeon: Aurther Blue, MD;  Location: [facility];  Service: Orthopedics;  Laterality: Right;   TRANSURETHRAL RESECTION OF PROSTATE N/A 08/06/2012   Procedure: Claudie Cull OF PROSTATE WITH GYRUS ;  Surgeon: Willye Harvey, MD;  Location: [facility];  Service: Urology;  Laterality: N/A;   UMBILICAL HERNIA REPAIR  1990   UVULECTOMY  2005   for sleep apnea   UVULOPALATOPHARYNGOPLASTY  6 years ago   Patient Active Problem List   Diagnosis Date Noted   Muscle spasms of neck 07/16/2023   Acute cough 06/05/2023   Abnormal EKG 06/05/2023   Idiopathic hypotension  06/05/2023   Myalgia, shoulder region 01/24/2023   Chronic pain syndrome 12/27/2022   Prediabetes 07/05/2022   Cervico-occipital neuralgia of right side 04/17/2022   Chronic tension-type headache, not intractable 02/27/2022   Postural kyphosis of cervicothoracic region 02/27/2022   Moderate aortic stenosis 10/27/2020   Thoracic aortic aneurysm (HCC) 03/12/2020   Sinus node dysfunction (HCC) 02/17/2020   Moderate persistent asthma without complication 06/09/2019   Chronic bronchitis, mucopurulent (HCC) 06/09/2019   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   Cervicalgia 04/01/2019   Degenerative disc disease, cervical 04/01/2019   Hypothyroidism    Presence of permanent cardiac pacemaker 10/23/2018   Sleep apnea 10/23/2018   Benign prostatic  hyperplasia without lower urinary tract symptoms 05/21/2017   Seasonal allergic rhinitis due to pollen 05/18/2017   Chronic renal disease, stage 3, moderately decreased glomerular filtration rate (GFR) between 30-59 mL/min/1.73 square meter (HCC) 06/14/2016   Gastroesophageal reflux disease without esophagitis 12/19/2015   Essential hypertension, benign 12/19/2015   Hyperlipidemia with target LDL less than 70 12/19/2015   OA (osteoarthritis) of knee 08/03/2014   Coronary artery disease 07/10/2014    PCP: Arcadio Knuckles, MD  REFERRING PROVIDER: Dawley, Colby Daub, DO  REFERRING DIAG: M54.2 (ICD-10-CM) - Cervicalgia  THERAPY DIAG:  Abnormal posture  Cervicalgia  Muscle weakness (generalized)  Other abnormalities of gait and mobility  Rationale for Evaluation and Treatment: Rehabilitation  ONSET DATE: ~2 years  SUBJECTIVE:                                                                                                                                                                                                         SUBJECTIVE STATEMENT: "I got a problem in my neck." 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 "Can't straighten up well." Pt states that while standing on his feet, the upper midback muscles tighten up and gets painful. Pt states he's been getting nerve blocks once a month which helps but doesn't fix the problem. Interested in dry needling.  Hand dominance: Right  PERTINENT HISTORY:  Bilat shoulder pain, R TKA  PAIN:  Are you having pain? Yes: NPRS scale: 2 or 3 at rest, standing up to 7-9 Pain location: R mid/upper thoracic Pain description: tightness, "just hurts" Aggravating factors: Standing ~15 min, walking flat ~1/4-1/2 mile, up a hill quicker Relieving factors: Sitting and stretching out,   PRECAUTIONS: None  RED FLAGS: None   WEIGHT BEARING RESTRICTIONS: No  FALLS:  Has patient fallen in last 6 months? 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  DIAGNOSTIC FINDINGS:  12/22/22 CT R shoulder IMPRESSION: 1. Partial thickness bursal surface tear of the supraspinatus tendon 2.7 cm from the peripheral insertion. 2. Mild osteoarthritis of the glenohumeral joint. 3. 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 Recommend annual imaging followup by CTA or MRA. This recommendation follows 2010 ACCF/AHA/AATS/ACR/ASA/SCA/SCAI/SIR/STS/SVM Guidelines for the Diagnosis and Management of Patients with Thoracic Aortic Disease. Circulation. 2010; 121: N829-F621. Aortic aneurysm NOS (ICD10-I71.9) 4.  Aortic Atherosclerosis (ICD10-I70.0).  10/20/22 MRI L shoulder IMPRESSION: 1. Mild tendinosis of the supraspinatus and subscapularis tendons. 2. Mild subacromial/subdeltoid bursitis.  04/01/19 Cervical spine x-ray IMPRESSION: Advanced degenerative facet disease bilaterally. Multilevel bilateral neural foraminal narrowing, left greater than right. No acute bony abnormality.   PATIENT SURVEYS:  Neck Disability Index score: 16 / 50 = 32.0 % (reports sleep is not disturbed by his neck pain so marked "no trouble sleeping" but it is disturbed by other medical issues)  COGNITION: Overall cognitive status: Within functional limits for tasks assessed  SENSATION: WFL  POSTURE: rounded shoulders, forward head, decreased lumbar lordosis, and increased thoracic kyphosis  PALPATION: TTP R>L thoracic paraspinal, rhomboids   CERVICAL ROM:   Active ROM A/PROM (deg) eval  Flexion 22  Extension 18*  Right lateral flexion 15*  Left  lateral flexion 15*  Right rotation 60  Left rotation 40   (Blank rows = not tested, * = pain)  UPPER EXTREMITY ROM:   Active ROM Right eval Left eval  Shoulder flexion ~150 ~150  Shoulder extension    Shoulder abduction ~150 ~150  Shoulder adduction    Shoulder extension    Shoulder internal rotation To L5 To L4  Shoulder external rotation ~80 deg ~80 deg  Elbow flexion    Elbow extension    Wrist flexion    Wrist extension    Wrist ulnar deviation    Wrist radial deviation    Wrist pronation    Wrist supination     (Blank rows = not tested)  UPPER EXTREMITY MMT:  MMT Right eval Left eval  Shoulder flexion 5 5  Shoulder extension 5 5  Shoulder abduction 5 5  Shoulder adduction    Shoulder extension    Shoulder internal rotation 5 5  Shoulder external rotation 4 5  Middle trapezius (prone) 3 3  Lower trapezius (prone) unable unable  Elbow flexion    Elbow extension    Wrist flexion    Wrist extension    Wrist ulnar deviation    Wrist radial deviation    Wrist pronation    Wrist supination    Grip strength     (Blank rows = not tested)  CERVICAL SPECIAL TESTS:  Did not assess  FUNCTIONAL TESTS:  Prone thoracic/cervical ext test: <16 sec Craniovertebral angel: TBA  TREATMENT DATE: 10/05/23 Manual therapy Trigger Point Dry Needling  Initial Treatment: Pt instructed on Dry Needling rational, procedures, and possible side effects. Pt instructed to expect mild to moderate muscle soreness later in the day and/or into the next day.  Pt instructed in methods to reduce muscle soreness. Pt instructed to continue prescribed HEP. Because Dry Needling was performed over or adjacent to  a lung field, pt was educated on S/S of pneumothorax and to seek immediate medical attention should they occur.  Patient was educated on signs and symptoms of infection and other risk factors and advised to seek medical attention should they occur.  Patient verbalized understanding  of these instructions and education.   Patient Verbal Consent Given: Yes Education Handout Provided: Yes Muscles Treated: R thoracic paraspinal Electrical Stimulation Performed: No Treatment Response/Outcome: Twitch response  See HEP below                                                                                                                                 PATIENT EDUCATION:  Education details: Exam findings, HEP, initial HEP Person educated: Patient Education method: Explanation, Demonstration, and Handouts Education comprehension: verbalized understanding, returned demonstration, and needs further education  HOME EXERCISE PROGRAM: Access Code: [email or web address] URL: [email or web address] Date: 10/05/2023 Prepared by: Kaeleb Emond April Erman [place]  Exercises - Seated Thoracic Extension AROM  - 1 x daily - 7 x weekly - 1 sets - 10 reps - Seated Cervical Retraction  - 1 x daily - 7 x weekly - 3 sets - 10 reps - Seated Scapular Retraction  - 1 x daily - 7 x weekly - 3 sets - 10 reps - Standing Paraspinals Mobilization with Small Ball on Wall  - 1 x daily - 7 x weekly - 2 sets - 10 reps  ASSESSMENT:  CLINICAL IMPRESSION: Patient is a 83 y.o. M who was seen today for physical therapy evaluation and treatment for neck/midback pain. PMH significant for fused vertebrae per pt report by arthritis. 2024 Imaging demos R partial rotator cuff tear and L shoulder with tendinosis of his rotator cuff. Assessment is significant for increased hypomobility throughout thoracic and lumbar spine, very increased thoracic kyphosis, forward head and anteriorly rotated humeral head with very weak and poor endurance in his rhomboids, low/mid traps, trunk extensors and deep neck muscles affecting his ability to tolerate prolonged standing tasks. Pt will benefit from PT to improve on these deficits to improve his ability to perform home and community tasks.   OBJECTIVE IMPAIRMENTS: decreased activity  tolerance, decreased endurance, decreased mobility, decreased ROM, decreased strength, hypomobility, increased fascial restrictions, increased muscle spasms, impaired flexibility, impaired UE functional use, improper body mechanics, postural dysfunction, and pain.   ACTIVITY LIMITATIONS: carrying, lifting, bathing, toileting, dressing, self feeding, reach over head, hygiene/grooming, and caring for others  PARTICIPATION LIMITATIONS: meal prep, cleaning, laundry, shopping, community activity, and yard work  PERSONAL FACTORS: Age, Fitness, Past/current experiences, and Time since onset of injury/illness/exacerbation are also affecting patient's functional outcome.   REHAB POTENTIAL: Good  CLINICAL DECISION MAKING: Evolving/moderate complexity  EVALUATION COMPLEXITY: Moderate   GOALS: Goals reviewed with patient? Yes  SHORT TERM GOALS: Target date: 10/26/2023   Pt will be ind with initial HEP Baseline:  Goal status: INITIAL  2.  Pt will demo pain free cervical ROM Baseline:  Goal  status: INITIAL  LONG TERM GOALS: Target date: 11/16/2023   Pt will be ind with management and progression of HEP Baseline:  Goal status: INITIAL  2.  Pt will demo at least 10 deg improvement in cervical ROM for working on his cars Baseline:  Goal status: INITIAL  3.  Pt will be able to tolerate standing >/=20 min to go grocery shopping with his wife  Baseline:  Goal status: INITIAL  4.  Pt will be able to tolerate prone cervical/thoracic ext test to at least 30 sec to demo increasing trunk extensor endurance Baseline:  Goal status: INITIAL  5.  Pt will have improved NDI to </=22% to demo MCID Baseline:  Goal status: INITIAL     PLAN:  PT FREQUENCY: 2x/week  PT DURATION: 6 weeks  PLANNED INTERVENTIONS: 97164- PT Re-evaluation, 97110-Therapeutic exercises, 97530- Therapeutic activity, 97112- Neuromuscular re-education, 97535- Self Care, 16109- Manual therapy, [phone]- Aquatic Therapy,  [id number]- Electrical stimulation (unattended), [phone]- Ionotophoresis 4mg /ml Dexamethasone , Patient/Family education, Balance training, Stair training, Taping, Dry Needling, Joint mobilization, Spinal mobilization, Cryotherapy, Moist heat, and Biofeedback  PLAN FOR NEXT SESSION: Assess response to HEP. Work on Nurse, adult with mobs as indicated. Improve midback and trunk extensor strengthening. Manual work/dry needling as indicated for pt's muscular tightness.    Yona Kosek April Ma L Aadi Bordner, PT 10/05/2023, 4:32 PM

## 2023-10-09 ENCOUNTER — Other Ambulatory Visit: Payer: Self-pay | Admitting: Internal Medicine

## 2023-10-09 NOTE — Telephone Encounter (Unsigned)
 Copied from CRM [id number]. Topic: Clinical - Medication Refill >> Oct 09, 2023 10:45 AM Ovid Blow wrote: Most Recent Primary Care Visit:  Provider: TYUS, BRENDELL L  Department: [facility]  Visit Type: MEDICARE AWV, SEQUENTIAL  Date: 07/02/2023  Medication: ARMOUR THYROID  60 MG tablet finasteride (PROSCAR) 5 MG tablet  Has the patient contacted their pharmacy? Yes (Agent: If no, request that the patient contact the pharmacy for the refill. If patient does not wish to contact the pharmacy document the reason why and proceed with request.) (Agent: If yes, when and what did the pharmacy advise?)  Is this the correct pharmacy for this prescription? Yes If no, delete pharmacy and type the correct one.  This is the patient's preferred pharmacy:  [id number] - [place],  - [address] [place] [address] Phone: [phone] Fax: [phone]   Has the prescription been filled recently? No  Is the patient out of the medication? Yes  Has the patient been seen for an appointment in the last year OR does the patient have an upcoming appointment? Yes  Can we respond through MyChart? Yes  Agent: Please be advised that Rx refills may take up to 3 business days. We ask that you follow-up with your pharmacy.

## 2023-10-16 ENCOUNTER — Other Ambulatory Visit: Payer: Self-pay | Admitting: Internal Medicine

## 2023-10-16 ENCOUNTER — Other Ambulatory Visit: Payer: Self-pay

## 2023-10-22 ENCOUNTER — Encounter: Admitting: Physical Medicine and Rehabilitation

## 2023-10-22 ENCOUNTER — Other Ambulatory Visit: Payer: Self-pay | Admitting: Internal Medicine

## 2023-10-22 DIAGNOSIS — H6504 Acute serous otitis media, recurrent, right ear: Secondary | ICD-10-CM

## 2023-10-22 DIAGNOSIS — H6991 Unspecified Eustachian tube disorder, right ear: Secondary | ICD-10-CM

## 2023-10-22 DIAGNOSIS — J4540 Moderate persistent asthma, uncomplicated: Secondary | ICD-10-CM

## 2023-10-23 ENCOUNTER — Other Ambulatory Visit: Payer: Self-pay | Admitting: Internal Medicine

## 2023-10-23 DIAGNOSIS — E039 Hypothyroidism, unspecified: Secondary | ICD-10-CM

## 2023-10-24 ENCOUNTER — Ambulatory Visit (INDEPENDENT_AMBULATORY_CARE_PROVIDER_SITE_OTHER): Payer: Medicare Other

## 2023-10-24 DIAGNOSIS — I495 Sick sinus syndrome: Secondary | ICD-10-CM | POA: Diagnosis not present

## 2023-10-24 LAB — CUP PACEART REMOTE DEVICE CHECK
Battery Remaining Longevity: 116 mo
Battery Voltage: 3.01 V
Brady Statistic AP VP Percent: 2.05 %
Brady Statistic AP VS Percent: 34.23 %
Brady Statistic AS VP Percent: 1.1 %
Brady Statistic AS VS Percent: 62.61 %
Brady Statistic RA Percent Paced: 36.31 %
Brady Statistic RV Percent Paced: 3.16 %
Date Time Interrogation Session: 20250521020421
Implantable Lead Connection Status: 753985
Implantable Lead Connection Status: 753985
Implantable Lead Implant Date: 20200527
Implantable Lead Implant Date: 20200527
Implantable Lead Location: 753859
Implantable Lead Location: 753860
Implantable Lead Model: 5076
Implantable Lead Model: 5076
Implantable Pulse Generator Implant Date: 20200527
Lead Channel Impedance Value: 323 Ohm
Lead Channel Impedance Value: 380 Ohm
Lead Channel Impedance Value: 456 Ohm
Lead Channel Impedance Value: 608 Ohm
Lead Channel Pacing Threshold Amplitude: 0.5 V
Lead Channel Pacing Threshold Amplitude: 0.625 V
Lead Channel Pacing Threshold Pulse Width: 0.4 ms
Lead Channel Pacing Threshold Pulse Width: 0.4 ms
Lead Channel Sensing Intrinsic Amplitude: 2.75 mV
Lead Channel Sensing Intrinsic Amplitude: 2.75 mV
Lead Channel Sensing Intrinsic Amplitude: 4.875 mV
Lead Channel Sensing Intrinsic Amplitude: 4.875 mV
Lead Channel Setting Pacing Amplitude: 1.5 V
Lead Channel Setting Pacing Amplitude: 2.5 V
Lead Channel Setting Pacing Pulse Width: 0.4 ms
Lead Channel Setting Sensing Sensitivity: 2 mV
Zone Setting Status: 755011
Zone Setting Status: 755011

## 2023-11-02 ENCOUNTER — Ambulatory Visit: Payer: Self-pay | Admitting: Cardiovascular Disease

## 2023-11-05 ENCOUNTER — Ambulatory Visit (HOSPITAL_BASED_OUTPATIENT_CLINIC_OR_DEPARTMENT_OTHER)

## 2023-11-07 ENCOUNTER — Encounter: Attending: Physical Medicine and Rehabilitation | Admitting: Physical Medicine and Rehabilitation

## 2023-11-07 ENCOUNTER — Other Ambulatory Visit: Payer: Self-pay | Admitting: Internal Medicine

## 2023-11-07 VITALS — BP 133/84 | HR 60 | Ht 72.0 in | Wt 190.0 lb

## 2023-11-07 DIAGNOSIS — I712 Thoracic aortic aneurysm, without rupture, unspecified: Secondary | ICD-10-CM | POA: Insufficient documentation

## 2023-11-07 DIAGNOSIS — E291 Testicular hypofunction: Secondary | ICD-10-CM | POA: Insufficient documentation

## 2023-11-07 DIAGNOSIS — M179 Osteoarthritis of knee, unspecified: Secondary | ICD-10-CM | POA: Diagnosis not present

## 2023-11-07 DIAGNOSIS — E785 Hyperlipidemia, unspecified: Secondary | ICD-10-CM | POA: Diagnosis not present

## 2023-11-07 DIAGNOSIS — G44229 Chronic tension-type headache, not intractable: Secondary | ICD-10-CM | POA: Diagnosis not present

## 2023-11-07 DIAGNOSIS — J411 Mucopurulent chronic bronchitis: Secondary | ICD-10-CM | POA: Diagnosis not present

## 2023-11-07 DIAGNOSIS — I350 Nonrheumatic aortic (valve) stenosis: Secondary | ICD-10-CM | POA: Diagnosis not present

## 2023-11-07 DIAGNOSIS — I129 Hypertensive chronic kidney disease with stage 1 through stage 4 chronic kidney disease, or unspecified chronic kidney disease: Secondary | ICD-10-CM | POA: Diagnosis not present

## 2023-11-07 DIAGNOSIS — E039 Hypothyroidism, unspecified: Secondary | ICD-10-CM | POA: Insufficient documentation

## 2023-11-07 DIAGNOSIS — K219 Gastro-esophageal reflux disease without esophagitis: Secondary | ICD-10-CM | POA: Diagnosis not present

## 2023-11-07 DIAGNOSIS — G4730 Sleep apnea, unspecified: Secondary | ICD-10-CM | POA: Insufficient documentation

## 2023-11-07 DIAGNOSIS — N183 Chronic kidney disease, stage 3 unspecified: Secondary | ICD-10-CM | POA: Insufficient documentation

## 2023-11-07 DIAGNOSIS — M542 Cervicalgia: Secondary | ICD-10-CM | POA: Insufficient documentation

## 2023-11-07 DIAGNOSIS — R7303 Prediabetes: Secondary | ICD-10-CM | POA: Diagnosis not present

## 2023-11-07 DIAGNOSIS — N400 Enlarged prostate without lower urinary tract symptoms: Secondary | ICD-10-CM | POA: Insufficient documentation

## 2023-11-07 DIAGNOSIS — M7918 Myalgia, other site: Secondary | ICD-10-CM

## 2023-11-07 DIAGNOSIS — M5481 Occipital neuralgia: Secondary | ICD-10-CM | POA: Insufficient documentation

## 2023-11-07 DIAGNOSIS — Z950 Presence of cardiac pacemaker: Secondary | ICD-10-CM | POA: Diagnosis not present

## 2023-11-07 DIAGNOSIS — M4003 Postural kyphosis, cervicothoracic region: Secondary | ICD-10-CM | POA: Insufficient documentation

## 2023-11-07 DIAGNOSIS — M79629 Pain in unspecified upper arm: Secondary | ICD-10-CM | POA: Insufficient documentation

## 2023-11-07 DIAGNOSIS — G894 Chronic pain syndrome: Secondary | ICD-10-CM | POA: Diagnosis not present

## 2023-11-07 DIAGNOSIS — I2510 Atherosclerotic heart disease of native coronary artery without angina pectoris: Secondary | ICD-10-CM | POA: Diagnosis not present

## 2023-11-07 DIAGNOSIS — J4540 Moderate persistent asthma, uncomplicated: Secondary | ICD-10-CM | POA: Diagnosis not present

## 2023-11-07 MED ORDER — TRIAMCINOLONE ACETONIDE 40 MG/ML IJ SUSP
8.0000 mg | Freq: Once | INTRAMUSCULAR | Status: AC
  Administered 2023-11-07: 8 mg via INTRAMUSCULAR

## 2023-11-07 MED ORDER — MELOXICAM 15 MG PO TABS: 15.0000 mg | ORAL_TABLET | Freq: Every day | ORAL | 2 refills | Status: DC

## 2023-11-07 MED ORDER — LIDOCAINE HCL 1 % IJ SOLN
6.0000 mL | Freq: Once | INTRAMUSCULAR | Status: AC
  Administered 2023-11-07: 6 mL

## 2023-11-07 NOTE — Progress Notes (Signed)
 HPI: [name] Glass is a 83 y.o. 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 who presents to clinic for treatment of pain related to chronic Right  sided headaches and cervicalgia  via injection as described below.    No new concerns or complaints. No major changes in medical history since last visit. Dry needling "hurt like crazy" but helped a little - he goes again tomorrow.      Physical Exam:  General: Appropriate appearance for age.  Mental Status: Appropriate mood and affect.  Cardiovascular: RRR, no m/r/g.  Respiratory: CTAB, no rales/rhonchi/wheezing.  Skin: No apparent rashes or lesions.  Neuro: Awake, alert, and oriented x3. No apparent deficits.  MSK: Moving all 4 limbs antigravity and against resistance.   + TTP with palpable trigger points in R cervical paraspinals,  R trapezius, R levator scapulae, and R infraspinatus.       PROCEDURE:  Right  greater occipital nerve block and  bilateral trigger point injections 1. Myofascial neck pain   2. Cervico-occipital neuralgia of right side   3. Cervicalgia       Goals with treatment: [ x ] Decrease pain [ x ] Improve Active / Passive ROM [ x ] Improve ADLs [  ] Improve functional mobility   MEDICATION:  [ x ] Kenalog  40 mg/mL  [ X ] Lidocaine  1%      CONSENT: Obtained in writing per policy. Consent uploaded to chart.   Benefits discussed.  Risks discussed included, but were not limited to, pain and discomfort, bleeding, bruising, allergic reaction, infection. All questions answered to patient/family member/guardian/ caregiver satisfaction. They would like to proceed with procedure. There are no noted contraindications to procedure.   PROCEDURE Time out was preformed No heat sources No antibiotics   The patient was explained about both the benefits and risks of a  Right  greater occipital nerve block and bilateral trigger point injections as above. After the patient acknowledged an understanding of the risks and benefits, the patient agreed to proceed. The area was first marked and then prepped in an aseptic fashion with betadine / alcohol. A 30 g, 1/2 inch needle was directed via a direct approach into the   R cervical paraspinals,  R trapezius, R levator scapulae, and R infraspinatus.. The injection was completed with Kenalog  40 mg/ml 1 cc mixed with 2 cc of 1% lidocaine  after no blood was aspirated on pull back. Then, another syringe of the same mixture was directed 1/3 between the inion and the Right  mastoid bone, just medial to the occipital artery, and after no blood on drawback 3 cc was injected.   No complications were encountered. The patient tolerated the procedure well.   Impression: HPI: [name] Glass is a 83 y.o. male with PMHx  has Coronary artery disease; OA (osteoarthritis) of knee; Gastroesophageal reflux disease without esophagitis; Essential hypertension, benign; Hyperlipidemia with target LDL less  than 70; Chronic renal disease, stage 3, moderately decreased glomerular filtration rate (GFR) between 30-59 mL/min/1.73 square meter (HCC); Seasonal allergic rhinitis due to pollen; Benign prostatic hyperplasia without lower urinary tract symptoms; Presence of permanent cardiac pacemaker; Sleep apnea; Hypothyroidism; Myofascial neck pain; Degenerative disc disease, cervical; Hypogonadism male; Moderate persistent asthma without complication; Chronic bronchitis, mucopurulent (HCC); Sinus node dysfunction (HCC); Thoracic aortic aneurysm (HCC); Moderate aortic stenosis; Chronic tension-type headache, not intractable; Postural kyphosis of cervicothoracic region; Cervico-occipital neuralgia of right side; Prediabetes; Chronic pain syndrome; Myalgia, shoulder region; Acute cough; Abnormal EKG; Idiopathic hypotension; and Muscle spasms of neck on their problem list. who presents to clinic for treatment of R cervicalgia and headache pain . They received a  Right  greater occipital nerve block and bilateral trigger point injections as above.   PLAN: - Resume Usual Activities. Notify Physician of any unusual bleeding, erythema or concern for side effects as reviewed above. - Apply ice prn for pain - Tylenol  and tramadol  prn for pain - Follow up PRN Q1M for TPI and GONB

## 2023-11-08 ENCOUNTER — Ambulatory Visit (HOSPITAL_BASED_OUTPATIENT_CLINIC_OR_DEPARTMENT_OTHER): Attending: Neurological Surgery | Admitting: Physical Therapy

## 2023-11-08 ENCOUNTER — Encounter (HOSPITAL_BASED_OUTPATIENT_CLINIC_OR_DEPARTMENT_OTHER): Payer: Self-pay | Admitting: Physical Therapy

## 2023-11-08 DIAGNOSIS — R2689 Other abnormalities of gait and mobility: Secondary | ICD-10-CM | POA: Diagnosis present

## 2023-11-08 DIAGNOSIS — M6281 Muscle weakness (generalized): Secondary | ICD-10-CM

## 2023-11-08 DIAGNOSIS — R293 Abnormal posture: Secondary | ICD-10-CM

## 2023-11-08 DIAGNOSIS — M542 Cervicalgia: Secondary | ICD-10-CM | POA: Diagnosis present

## 2023-11-08 NOTE — Therapy (Signed)
 OUTPATIENT PHYSICAL THERAPY CERVICAL EVALUATION   Patient Name: [name] Glass MRN: [id number] DOB:10/11/1940, 83 y.o., male Today's Date: 11/08/2023  END OF SESSION:  PT End of Session - 11/08/23 1403     Visit Number 2    Number of Visits 12    Date for PT Re-Evaluation 11/16/23    Authorization Type Medicare    Progress Note Due on Visit 10    PT Start Time 1315    PT Stop Time 1400    PT Time Calculation (min) 45 min    Activity Tolerance Patient tolerated treatment well    Behavior During Therapy [facility] for tasks assessed/performed              Past Medical History:  Diagnosis Date   Arthritis    Basal cell carcinoma 02/17/2020   infl & ulcerated-Left forehead (MOHS)   Coronary artery disease    GERD (gastroesophageal reflux disease)    H/O bronchitis    H/O hiatal hernia    History of kidney stones last in 1976   Hyperlipidemia    statin intolerant, under control   Hypertension    under control   Hypertensive retinopathy    OU   Hypothyroidism    Moderate persistent asthma without complication 06/09/2019   Retinal detachment    OD   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   left helix tx cx3 74fu   Sleep apnea    hx of, surgery to reverse   Past Surgical History:  Procedure Laterality Date   CARDIAC CATHETERIZATION Right 2008   5 heart stents   CATARACT EXTRACTION Bilateral 6 years ago   CYSTOSCOPY N/A 08/06/2012   Procedure: CYSTOSCOPY;  Surgeon: Willye Harvey, MD;  Location: [facility];  Service: Urology;  Laterality: N/A;   ESOPHAGOGASTRIC FUNDOPLICATION  1985   spleen removed, 5 pints of blood given   EYE SURGERY     HERNIA REPAIR  1985   x4   INGUINAL HERNIA REPAIR  1992   PACEMAKER IMPLANT N/A 10/30/2018   Procedure: PACEMAKER IMPLANT;  Surgeon: Lei Pump, MD;  Location: [facility];  Service: Cardiovascular;  Laterality: N/A;   PACEMAKER INSERTION     PPM GENERATOR REMOVAL N/A 10/25/2018   Procedure: PPM GENERATOR REMOVAL;   Surgeon: Lei Pump, MD;  Location: [facility];  Service: Cardiovascular;  Laterality: N/A;   PROSTATE ABLATION  2013   "laser ablation"   RETINAL DETACHMENT SURGERY Right 01/24/2019   Pneumatic cryopexy - Dr. Ronelle Coffee   TEE WITHOUT CARDIOVERSION N/A 10/25/2018   Procedure: TRANSESOPHAGEAL ECHOCARDIOGRAM (TEE);  Surgeon: Alroy Aspen Lela Purple, MD;  Location: [facility];  Service: Cardiovascular;  Laterality: N/A;   TEMPORARY PACEMAKER N/A 10/25/2018   Procedure: TEMPORARY PACEMAKER;  Surgeon: Lei Pump, MD;  Location: [facility];  Service: Cardiovascular;  Laterality: N/A;   TONSILLECTOMY  as child   TOTAL KNEE ARTHROPLASTY Right 08/03/2014   Procedure: TOTAL RIGHT KNEE ARTHROPLASTY;  Surgeon: Aurther Blue, MD;  Location: [facility];  Service: Orthopedics;  Laterality: Right;   TRANSURETHRAL RESECTION OF PROSTATE N/A 08/06/2012   Procedure: Claudie Cull OF PROSTATE WITH GYRUS ;  Surgeon: Willye Harvey, MD;  Location: [facility];  Service: Urology;  Laterality: N/A;   UMBILICAL HERNIA REPAIR  1990   UVULECTOMY  2005   for sleep apnea   UVULOPALATOPHARYNGOPLASTY  6 years ago   Patient Active Problem List   Diagnosis Date Noted  Muscle spasms of neck 07/16/2023   Acute cough 06/05/2023   Abnormal EKG 06/05/2023   Idiopathic hypotension 06/05/2023   Myalgia, shoulder region 01/24/2023   Chronic pain syndrome 12/27/2022   Prediabetes 07/05/2022   Cervico-occipital neuralgia of right side 04/17/2022   Chronic tension-type headache, not intractable 02/27/2022   Postural kyphosis of cervicothoracic region 02/27/2022   Moderate aortic stenosis 10/27/2020   Thoracic aortic aneurysm (HCC) 03/12/2020   Sinus node dysfunction (HCC) 02/17/2020   Moderate persistent asthma without complication 06/09/2019   Chronic bronchitis, mucopurulent (HCC) 06/09/2019   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   Myofascial neck pain 04/01/2019   Degenerative disc disease, cervical  04/01/2019   Hypothyroidism    Presence of permanent cardiac pacemaker 10/23/2018   Sleep apnea 10/23/2018   Benign prostatic hyperplasia without lower urinary tract symptoms 05/21/2017   Seasonal allergic rhinitis due to pollen 05/18/2017   Chronic renal disease, stage 3, moderately decreased glomerular filtration rate (GFR) between 30-59 mL/min/1.73 square meter (HCC) 06/14/2016   Gastroesophageal reflux disease without esophagitis 12/19/2015   Essential hypertension, benign 12/19/2015   Hyperlipidemia with target LDL less than 70 12/19/2015   OA (osteoarthritis) of knee 08/03/2014   Coronary artery disease 07/10/2014    PCP: Arcadio Knuckles, MD  REFERRING PROVIDER: Dawley, Colby Daub, DO  REFERRING DIAG: M54.2 (ICD-10-CM) - Cervicalgia  THERAPY DIAG:  Abnormal posture  Cervicalgia  Muscle weakness (generalized)  Rationale for Evaluation and Treatment: Rehabilitation  ONSET DATE: ~2 years  SUBJECTIVE:                                                                                                                                                                                                         SUBJECTIVE STATEMENT:  Pt states the blocks are more helpful than DN. Would like to defer right now due to cost of DN.    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 "Can't straighten up well." Pt states that while standing on his feet, the upper midback muscles tighten up and gets painful. Pt states he's been getting nerve blocks once a month which helps but doesn't fix the problem. Interested in dry needling.  Hand dominance: Right  PERTINENT HISTORY:  Bilat shoulder pain, R TKA  PAIN:  Are you having pain? Yes: NPRS scale: 2 or 3 at rest, standing up to 7-9 Pain location: R mid/upper thoracic Pain description: tightness, "just hurts" Aggravating factors: Standing ~15 min, walking flat ~1/4-1/2 mile, up a hill  quicker Relieving factors: Sitting and stretching out,   PRECAUTIONS: None  RED FLAGS: None   WEIGHT BEARING RESTRICTIONS: No  FALLS:  Has patient fallen in last 6 months? 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  DIAGNOSTIC FINDINGS:  12/22/22 CT R shoulder IMPRESSION: 1. Partial thickness bursal surface tear of the supraspinatus tendon 2.7 cm from the peripheral insertion. 2. Mild osteoarthritis of the glenohumeral joint. 3. 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 Recommend annual imaging followup by CTA or MRA. This recommendation follows 2010 ACCF/AHA/AATS/ACR/ASA/SCA/SCAI/SIR/STS/SVM Guidelines for the Diagnosis and Management of Patients with Thoracic Aortic Disease. Circulation. 2010; 121: Z610-R604. Aortic aneurysm NOS (ICD10-I71.9) 4.  Aortic Atherosclerosis (ICD10-I70.0).  10/20/22 MRI L shoulder IMPRESSION: 1. Mild tendinosis of the supraspinatus and subscapularis tendons. 2. Mild subacromial/subdeltoid bursitis.  04/01/19 Cervical spine x-ray IMPRESSION: Advanced degenerative facet disease bilaterally. Multilevel bilateral neural foraminal narrowing, left greater than right. No acute bony abnormality.   PATIENT SURVEYS:  Neck Disability Index score: 16 / 50 = 32.0 % (reports sleep is not disturbed by his neck pain so marked "no trouble sleeping" but it is disturbed by other medical issues)  COGNITION: Overall cognitive status: Within functional limits for tasks assessed  SENSATION: WFL  POSTURE: rounded shoulders, forward head, decreased lumbar lordosis, and  increased thoracic kyphosis  PALPATION: TTP R>L thoracic paraspinal, rhomboids   CERVICAL ROM:   Active ROM A/PROM (deg) eval  Flexion 22  Extension 18*  Right lateral flexion 15*  Left lateral flexion 15*  Right rotation 60  Left rotation 40   (Blank rows = not tested, * = pain)  UPPER EXTREMITY ROM:   Active ROM Right eval Left eval  Shoulder flexion ~150 ~150  Shoulder extension    Shoulder abduction ~150 ~150  Shoulder adduction    Shoulder extension    Shoulder internal rotation To L5 To L4  Shoulder external rotation ~80 deg ~80 deg  Elbow flexion    Elbow extension    Wrist flexion    Wrist extension    Wrist ulnar deviation    Wrist radial deviation    Wrist pronation    Wrist supination     (Blank rows = not tested)  UPPER EXTREMITY MMT:  MMT Right eval Left eval  Shoulder flexion 5 5  Shoulder extension 5 5  Shoulder abduction 5 5  Shoulder adduction    Shoulder extension    Shoulder internal rotation 5 5  Shoulder external rotation 4 5  Middle trapezius (prone) 3 3  Lower trapezius (prone) unable unable  Elbow flexion    Elbow extension    Wrist flexion    Wrist extension    Wrist ulnar deviation    Wrist radial deviation    Wrist pronation    Wrist supination    Grip strength     (Blank rows = not tested)  CERVICAL SPECIAL TESTS:  Did not assess  FUNCTIONAL TESTS:  Prone thoracic/cervical ext test: <16 sec Craniovertebral angel: TBA  TREATMENT DATE:   6/5 Bilat C/S STM paraspinals Bilat UT STM Light manual cervical traction  Wall thoracic ext 2x10 2s hold Chin tucks  15x RTB row 3x10 Self massage with ball   Postural changes throughout the day, reading position, static posture vs movement related to pain, self STM, massage therapy options   10/05/23 Manual therapy Trigger Point Dry Needling  Initial Treatment: Pt instructed on Dry Needling rational, procedures, and possible side effects. Pt instructed to expect mild  to moderate muscle soreness later in the day and/or into the next day.  Pt instructed in methods to reduce muscle soreness. Pt instructed to continue prescribed HEP. Because Dry Needling was performed over or adjacent to a lung field, pt was educated on S/S of pneumothorax and to seek immediate medical attention should they occur.  Patient was educated on signs and symptoms of infection and other risk factors and advised to seek medical attention should they occur.  Patient verbalized understanding of these instructions and education.   Patient Verbal Consent Given: Yes Education Handout Provided: Yes Muscles Treated: R thoracic paraspinal Electrical Stimulation Performed: No Treatment Response/Outcome: Twitch response  See HEP below                                                                                                                                 PATIENT EDUCATION:  Education details: Exam findings, HEP, initial HEP Person educated: Patient Education method: Explanation, Demonstration, and Handouts Education comprehension: verbalized understanding, returned demonstration, and needs further education  HOME EXERCISE PROGRAM: Access Code: [email or web address] URL: [email or web address] Date: 10/05/2023 Prepared by: Gellen April Erman Hayward  Exercises - Seated Thoracic Extension AROM  - 1 x daily - 7 x weekly - 1 sets - 10 reps - Seated Cervical Retraction  - 1 x daily - 7 x weekly - 3 sets - 10 reps - Seated Scapular Retraction  - 1 x daily - 7 x weekly - 3 sets - 10 reps - Standing Paraspinals Mobilization with Small Ball on Wall  - 1 x daily - 7 x weekly - 2 sets - 10 reps  ASSESSMENT:  CLINICAL IMPRESSION: Pt return to therapy today following scheduling conflicts Pt did find improvement in cervical ROM and mobility after manual therapy and was able to review HEP has he had stopped due to increase in pain from DN last session. Pt advised that regular postural  changes frequently during his reading will be more beneficial than block exercise. Pt would like to defer TPDN currently and continue with manual and exercise. Consider pec stretching and T/S self mobs at next. Pt will benefit from PT to improve on these deficits to improve his ability to perform home and community tasks.   OBJECTIVE IMPAIRMENTS: decreased activity tolerance, decreased endurance, decreased mobility, decreased ROM, decreased strength, hypomobility, increased fascial restrictions, increased muscle spasms, impaired flexibility, impaired UE functional use, improper body mechanics, postural dysfunction, and pain.   ACTIVITY LIMITATIONS: carrying, lifting, bathing, toileting, dressing, self feeding, reach over head, hygiene/grooming, and caring for others  PARTICIPATION LIMITATIONS: meal prep, cleaning, laundry, shopping, community activity,  and yard work  PERSONAL FACTORS: Age, Fitness, Past/current experiences, and Time since onset of injury/illness/exacerbation are also affecting patient's functional outcome.   REHAB POTENTIAL: Good  CLINICAL DECISION MAKING: Evolving/moderate complexity  EVALUATION COMPLEXITY: Moderate   GOALS: Goals reviewed with patient? Yes  SHORT TERM GOALS: Target date: 10/26/2023   Pt will be ind with initial HEP Baseline:  Goal status: INITIAL  2.  Pt will demo pain free cervical ROM Baseline:  Goal status: INITIAL  LONG TERM GOALS: Target date: 11/16/2023   Pt will be ind with management and progression of HEP Baseline:  Goal status: INITIAL  2.  Pt will demo at least 10 deg improvement in cervical ROM for working on his cars Baseline:  Goal status: INITIAL  3.  Pt will be able to tolerate standing >/=20 min to go grocery shopping with his wife  Baseline:  Goal status: INITIAL  4.  Pt will be able to tolerate prone cervical/thoracic ext test to at least 30 sec to demo increasing trunk extensor endurance Baseline:  Goal status:  INITIAL  5.  Pt will have improved NDI to </=22% to demo MCID Baseline:  Goal status: INITIAL     PLAN:  PT FREQUENCY: 2x/week  PT DURATION: 6 weeks  PLANNED INTERVENTIONS: 97164- PT Re-evaluation, 97110-Therapeutic exercises, 97530- Therapeutic activity, 97112- Neuromuscular re-education, 97535- Self Care, 16109- Manual therapy, [phone]- Aquatic Therapy, [id number]- Electrical stimulation (unattended), [phone]- Ionotophoresis 4mg /ml Dexamethasone , Patient/Family education, Balance training, Stair training, Taping, Dry Needling, Joint mobilization, Spinal mobilization, Cryotherapy, Moist heat, and Biofeedback  PLAN FOR NEXT SESSION: Assess response to HEP. Work on Nurse, adult with mobs as indicated. Improve midback and trunk extensor strengthening. Manual work/dry needling as indicated for pt's muscular tightness.    Silver Dross, PT 11/08/2023, 2:08 PM

## 2023-11-12 ENCOUNTER — Ambulatory Visit (HOSPITAL_BASED_OUTPATIENT_CLINIC_OR_DEPARTMENT_OTHER): Admitting: Physical Therapy

## 2023-11-12 ENCOUNTER — Encounter (HOSPITAL_BASED_OUTPATIENT_CLINIC_OR_DEPARTMENT_OTHER): Payer: Self-pay | Admitting: Physical Therapy

## 2023-11-12 DIAGNOSIS — R293 Abnormal posture: Secondary | ICD-10-CM

## 2023-11-12 DIAGNOSIS — M6281 Muscle weakness (generalized): Secondary | ICD-10-CM | POA: Diagnosis not present

## 2023-11-12 DIAGNOSIS — R2689 Other abnormalities of gait and mobility: Secondary | ICD-10-CM | POA: Diagnosis not present

## 2023-11-12 DIAGNOSIS — M542 Cervicalgia: Secondary | ICD-10-CM | POA: Diagnosis not present

## 2023-11-12 NOTE — Therapy (Signed)
 OUTPATIENT PHYSICAL THERAPY CERVICAL TREATMENT   Patient Name: [name] Glass MRN: [id number] DOB:[birth year], 83 y.o., male Today's Date: 11/12/2023  END OF SESSION:  PT End of Session - 11/12/23 1251     Visit Number 3    Number of Visits 12    Date for PT Re-Evaluation 11/16/23    Authorization Type Medicare    Progress Note Due on Visit 10    PT Start Time 1315    PT Stop Time 1355    PT Time Calculation (min) 40 min    Activity Tolerance Patient tolerated treatment well    Behavior During Therapy [facility] for tasks assessed/performed              Past Medical History:  Diagnosis Date   Arthritis    Basal cell carcinoma 02/17/2020   infl & ulcerated-Left forehead (MOHS)   Coronary artery disease    GERD (gastroesophageal reflux disease)    H/O bronchitis    H/O hiatal hernia    History of kidney stones last in 1976   Hyperlipidemia    statin intolerant, under control   Hypertension    under control   Hypertensive retinopathy    OU   Hypothyroidism    Moderate persistent asthma without complication 06/09/2019   Retinal detachment    OD   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   left helix tx cx3 39fu   Sleep apnea    hx of, surgery to reverse   Past Surgical History:  Procedure Laterality Date   CARDIAC CATHETERIZATION Right 2008   5 heart stents   CATARACT EXTRACTION Bilateral 6 years ago   CYSTOSCOPY N/A 08/06/2012   Procedure: CYSTOSCOPY;  Surgeon: Willye Harvey, MD;  Location: [facility];  Service: Urology;  Laterality: N/A;   ESOPHAGOGASTRIC FUNDOPLICATION  1985   spleen removed, 5 pints of blood given   EYE SURGERY     HERNIA REPAIR  1985   x4   INGUINAL HERNIA REPAIR  1992   PACEMAKER IMPLANT N/A 10/30/2018   Procedure: PACEMAKER IMPLANT;  Surgeon: Lei Pump, MD;  Location: [facility];  Service: Cardiovascular;  Laterality: N/A;   PACEMAKER INSERTION     PPM GENERATOR REMOVAL N/A 10/25/2018   Procedure: PPM GENERATOR REMOVAL;   Surgeon: Lei Pump, MD;  Location: [facility];  Service: Cardiovascular;  Laterality: N/A;   PROSTATE ABLATION  2013   "laser ablation"   RETINAL DETACHMENT SURGERY Right 01/24/2019   Pneumatic cryopexy - Dr. Ronelle Coffee   TEE WITHOUT CARDIOVERSION N/A 10/25/2018   Procedure: TRANSESOPHAGEAL ECHOCARDIOGRAM (TEE);  Surgeon: Alroy Aspen Lela Purple, MD;  Location: [facility];  Service: Cardiovascular;  Laterality: N/A;   TEMPORARY PACEMAKER N/A 10/25/2018   Procedure: TEMPORARY PACEMAKER;  Surgeon: Lei Pump, MD;  Location: [facility];  Service: Cardiovascular;  Laterality: N/A;   TONSILLECTOMY  as child   TOTAL KNEE ARTHROPLASTY Right 08/03/2014   Procedure: TOTAL RIGHT KNEE ARTHROPLASTY;  Surgeon: Aurther Blue, MD;  Location: [facility];  Service: Orthopedics;  Laterality: Right;   TRANSURETHRAL RESECTION OF PROSTATE N/A 08/06/2012   Procedure: Claudie Cull OF PROSTATE WITH GYRUS ;  Surgeon: Willye Harvey, MD;  Location: [facility];  Service: Urology;  Laterality: N/A;   UMBILICAL HERNIA REPAIR  1990   UVULECTOMY  2005   for sleep apnea   UVULOPALATOPHARYNGOPLASTY  6 years ago   Patient Active Problem List   Diagnosis Date Noted  Muscle spasms of neck 07/16/2023   Acute cough 06/05/2023   Abnormal EKG 06/05/2023   Idiopathic hypotension 06/05/2023   Myalgia, shoulder region 01/24/2023   Chronic pain syndrome 12/27/2022   Prediabetes 07/05/2022   Cervico-occipital neuralgia of right side 04/17/2022   Chronic tension-type headache, not intractable 02/27/2022   Postural kyphosis of cervicothoracic region 02/27/2022   Moderate aortic stenosis 10/27/2020   Thoracic aortic aneurysm (HCC) 03/12/2020   Sinus node dysfunction (HCC) 02/17/2020   Moderate persistent asthma without complication 06/09/2019   Chronic bronchitis, mucopurulent (HCC) 06/09/2019   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   Myofascial neck pain 04/01/2019   Degenerative disc disease, cervical 04/01/2019    Hypothyroidism    Presence of permanent cardiac pacemaker 10/23/2018   Sleep apnea 10/23/2018   Benign prostatic hyperplasia without lower urinary tract symptoms 05/21/2017   Seasonal allergic rhinitis due to pollen 05/18/2017   Chronic renal disease, stage 3, moderately decreased glomerular filtration rate (GFR) between 30-59 mL/min/1.73 square meter (HCC) 06/14/2016   Gastroesophageal reflux disease without esophagitis 12/19/2015   Essential hypertension, benign 12/19/2015   Hyperlipidemia with target LDL less than 70 12/19/2015   OA (osteoarthritis) of knee 08/03/2014   Coronary artery disease 07/10/2014    PCP: Arcadio Knuckles, MD  REFERRING PROVIDER: Dawley, Colby Daub, DO  REFERRING DIAG: M54.2 (ICD-10-CM) - Cervicalgia  THERAPY DIAG:  Abnormal posture  Cervicalgia  Muscle weakness (generalized)  Other abnormalities of gait and mobility  Rationale for Evaluation and Treatment: Rehabilitation  ONSET DATE: ~2 years  SUBJECTIVE:                                                                                                                                                                                                         SUBJECTIVE STATEMENT:  Pt states the motion is lightly better than last time. Pt was doing HEP without pain.    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 "Can't straighten up well." Pt states that while standing on his feet, the upper midback muscles tighten up and gets painful. Pt states he's been getting nerve blocks once a month which helps but doesn't fix the problem. Interested in dry needling.  Hand dominance: Right  PERTINENT HISTORY:  Bilat shoulder pain, R TKA  PAIN:  Are you having pain? Yes: NPRS scale: 2 or 3 at rest, standing up to 7-9 Pain location: R mid/upper thoracic Pain description: tightness, "just hurts" Aggravating factors: Standing ~15 min, walking flat  ~1/4-1/2 mile,  up a hill quicker Relieving factors: Sitting and stretching out,   PRECAUTIONS: None  RED FLAGS: None   WEIGHT BEARING RESTRICTIONS: No  FALLS:  Has patient fallen in last 6 months? 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  DIAGNOSTIC FINDINGS:  12/22/22 CT R shoulder IMPRESSION: 1. Partial thickness bursal surface tear of the supraspinatus tendon 2.7 cm from the peripheral insertion. 2. Mild osteoarthritis of the glenohumeral joint. 3. 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 Recommend annual imaging followup by CTA or MRA. This recommendation follows 2010 ACCF/AHA/AATS/ACR/ASA/SCA/SCAI/SIR/STS/SVM Guidelines for the Diagnosis and Management of Patients with Thoracic Aortic Disease. Circulation. 2010; 121: Z610-R604. Aortic aneurysm NOS (ICD10-I71.9) 4.  Aortic Atherosclerosis (ICD10-I70.0).  10/20/22 MRI L shoulder IMPRESSION: 1. Mild tendinosis of the supraspinatus and subscapularis tendons. 2. Mild subacromial/subdeltoid bursitis.  04/01/19 Cervical spine x-ray IMPRESSION: Advanced degenerative facet disease bilaterally. Multilevel bilateral neural foraminal narrowing, left greater than right. No acute bony abnormality.   PATIENT SURVEYS:  Neck Disability Index score: 16 / 50 = 32.0 % (reports sleep is not disturbed by his neck pain so marked "no trouble sleeping" but it is disturbed by other medical issues)  COGNITION: Overall cognitive status: Within functional limits for tasks assessed  SENSATION: WFL  POSTURE: rounded shoulders, forward head, decreased  lumbar lordosis, and increased thoracic kyphosis  PALPATION: TTP R>L thoracic paraspinal, rhomboids   CERVICAL ROM:   Active ROM A/PROM (deg) eval  Flexion 22  Extension 18*  Right lateral flexion 15*  Left lateral flexion 15*  Right rotation 60  Left rotation 40   (Blank rows = not tested, * = pain)  UPPER EXTREMITY ROM:   Active ROM Right eval Left eval  Shoulder flexion ~150 ~150  Shoulder extension    Shoulder abduction ~150 ~150  Shoulder adduction    Shoulder extension    Shoulder internal rotation To L5 To L4  Shoulder external rotation ~80 deg ~80 deg  Elbow flexion    Elbow extension    Wrist flexion    Wrist extension    Wrist ulnar deviation    Wrist radial deviation    Wrist pronation    Wrist supination     (Blank rows = not tested)  UPPER EXTREMITY MMT:  MMT Right eval Left eval  Shoulder flexion 5 5  Shoulder extension 5 5  Shoulder abduction 5 5  Shoulder adduction    Shoulder extension    Shoulder internal rotation 5 5  Shoulder external rotation 4 5  Middle trapezius (prone) 3 3  Lower trapezius (prone) unable unable  Elbow flexion    Elbow extension    Wrist flexion    Wrist extension    Wrist ulnar deviation    Wrist radial deviation    Wrist pronation    Wrist supination    Grip strength     (Blank rows = not tested)  CERVICAL SPECIAL TESTS:  Did not assess  FUNCTIONAL TESTS:  Prone thoracic/cervical ext test: <16 sec Craniovertebral angel: TBA  TREATMENT DATE:   6/9  Bilat C/S STM paraspinals Bilat UT STM STM R rhomboid  Self chair t/s extension 3s  10x Pec stretch 30s 3x ea T/S ext with towel in chair 2s 10x  YTB horizontal ABD 10x  6/5 Bilat C/S STM paraspinals Bilat UT STM Light manual cervical traction  Wall thoracic ext 2x10 2s hold Chin tucks 15x RTB row 3x10 Self massage with ball   Postural changes throughout the day, reading position, static posture vs movement related to pain, self STM,  massage therapy options   10/05/23 Manual therapy Trigger Point Dry Needling  Initial Treatment: Pt instructed on Dry Needling rational, procedures, and possible side effects. Pt instructed to expect mild to moderate muscle soreness later in the day and/or into the next day.  Pt instructed in methods to reduce muscle soreness. Pt instructed to continue prescribed HEP. Because Dry Needling was performed over or adjacent to a lung field, pt was educated on S/S of pneumothorax and to seek immediate medical attention should they occur.  Patient was educated on signs and symptoms of infection and other risk factors and advised to seek medical attention should they occur.  Patient verbalized understanding of these instructions and education.   Patient Verbal Consent Given: Yes Education Handout Provided: Yes Muscles Treated: R thoracic paraspinal Electrical Stimulation Performed: No Treatment Response/Outcome: Twitch response  See HEP below                                                                                                                                 PATIENT EDUCATION:  Education details: Exam findings, HEP, initial HEP Person educated: Patient Education method: Explanation, Demonstration, and Handouts Education comprehension: verbalized understanding, returned demonstration, and needs further education  HOME EXERCISE PROGRAM: Access Code: [email or web address] URL: [email or web address] Date: 10/05/2023 Prepared by: Gellen April Erman [place]ward  Exercises - Seated Thoracic Extension AROM  - 1 x daily - 7 x weekly - 1 sets - 10 reps - Seated Cervical Retraction  - 1 x daily - 7 x weekly - 3 sets - 10 reps - Seated Scapular Retraction  - 1 x daily - 7 x weekly - 3 sets - 10 reps - Standing Paraspinals Mobilization with Small Ball on Wall  - 1 x daily - 7 x weekly - 2 sets - 10 reps  ASSESSMENT:  CLINICAL IMPRESSION: Pt able to progress with stretching and thoracic  mobility today without increase in pain. Pt was able to improve postural positioning in stretching position when given appropriate cuing. HEP updated with self mobilizations and light mid back resistance today without increasing pain. Plan to continue with postural mobility exercise and retraining at future visits.  Pt will benefit from PT to improve on these deficits to improve his ability to perform home and community tasks.   OBJECTIVE IMPAIRMENTS: decreased activity tolerance, decreased endurance, decreased mobility, decreased ROM, decreased strength, hypomobility, increased fascial restrictions, increased muscle spasms, impaired flexibility, impaired UE functional use, improper body mechanics, postural dysfunction, and pain.   ACTIVITY LIMITATIONS: carrying, lifting, bathing, toileting, dressing, self feeding,  reach over head, hygiene/grooming, and caring for others  PARTICIPATION LIMITATIONS: meal prep, cleaning, laundry, shopping, community activity, and yard work  PERSONAL FACTORS: Age, Fitness, Past/current experiences, and Time since onset of injury/illness/exacerbation are also affecting patient's functional outcome.   REHAB POTENTIAL: Good  CLINICAL DECISION MAKING: Evolving/moderate complexity  EVALUATION COMPLEXITY: Moderate   GOALS: Goals reviewed with patient? Yes  SHORT TERM GOALS: Target date: 10/26/2023   Pt will be ind with initial HEP Baseline:  Goal status: INITIAL  2.  Pt will demo pain free cervical ROM Baseline:  Goal status: INITIAL  LONG TERM GOALS: Target date: 11/16/2023   Pt will be ind with management and progression of HEP Baseline:  Goal status: INITIAL  2.  Pt will demo at least 10 deg improvement in cervical ROM for working on his cars Baseline:  Goal status: INITIAL  3.  Pt will be able to tolerate standing >/=20 min to go grocery shopping with his wife  Baseline:  Goal status: INITIAL  4.  Pt will be able to tolerate prone  cervical/thoracic ext test to at least 30 sec to demo increasing trunk extensor endurance Baseline:  Goal status: INITIAL  5.  Pt will have improved NDI to </=22% to demo MCID Baseline:  Goal status: INITIAL     PLAN:  PT FREQUENCY: 2x/week  PT DURATION: 6 weeks  PLANNED INTERVENTIONS: [id number]- PT Re-evaluation, 97110-Therapeutic exercises, 97530- Therapeutic activity, 97112- Neuromuscular re-education, 97535- Self Care, [id number]- Manual therapy, [phone]- Aquatic Therapy, [id number]- Electrical stimulation (unattended), [phone]- Ionotophoresis 4mg /ml Dexamethasone , Patient/Family education, Balance training, Stair training, Taping, Dry Needling, Joint mobilization, Spinal mobilization, Cryotherapy, Moist heat, and Biofeedback  PLAN FOR NEXT SESSION: Assess response to HEP. Work on Nurse, adult with mobs as indicated. Improve midback and trunk extensor strengthening. Manual work/dry needling as indicated for pt's muscular tightness.    Silver Dross, PT 11/12/2023, 1:59 PM

## 2023-11-15 ENCOUNTER — Encounter (HOSPITAL_BASED_OUTPATIENT_CLINIC_OR_DEPARTMENT_OTHER): Admitting: Physical Therapy

## 2023-11-19 ENCOUNTER — Encounter (HOSPITAL_BASED_OUTPATIENT_CLINIC_OR_DEPARTMENT_OTHER)

## 2023-11-20 ENCOUNTER — Other Ambulatory Visit: Payer: Self-pay | Admitting: Internal Medicine

## 2023-11-20 DIAGNOSIS — E291 Testicular hypofunction: Secondary | ICD-10-CM

## 2023-11-21 ENCOUNTER — Encounter: Attending: Physical Medicine and Rehabilitation | Admitting: Physical Medicine and Rehabilitation

## 2023-11-22 ENCOUNTER — Encounter (HOSPITAL_BASED_OUTPATIENT_CLINIC_OR_DEPARTMENT_OTHER): Payer: Self-pay | Admitting: Physical Therapy

## 2023-11-22 ENCOUNTER — Ambulatory Visit (HOSPITAL_BASED_OUTPATIENT_CLINIC_OR_DEPARTMENT_OTHER): Admitting: Physical Therapy

## 2023-11-22 DIAGNOSIS — M542 Cervicalgia: Secondary | ICD-10-CM | POA: Diagnosis not present

## 2023-11-22 DIAGNOSIS — R293 Abnormal posture: Secondary | ICD-10-CM

## 2023-11-22 DIAGNOSIS — M6281 Muscle weakness (generalized): Secondary | ICD-10-CM

## 2023-11-22 DIAGNOSIS — R2689 Other abnormalities of gait and mobility: Secondary | ICD-10-CM | POA: Diagnosis not present

## 2023-11-22 NOTE — Therapy (Signed)
 OUTPATIENT PHYSICAL THERAPY CERVICAL TREATMENT  Progress Note Reporting Period 10/05/2023 to 11/22/23  See note below for Objective Data and Assessment of Progress/Goals.       Patient Name: [name] Glass MRN: [id number] DOB:06/24/1940, 83 y.o., male Today's Date: 11/22/2023  END OF SESSION:  PT End of Session - 11/22/23 1357     Visit Number 4    Number of Visits 12    Date for PT Re-Evaluation 11/16/23    Authorization Type Medicare    Progress Note Due on Visit 10    PT Start Time 1315    PT Stop Time 1355    PT Time Calculation (min) 40 min    Activity Tolerance Patient tolerated treatment well    Behavior During Therapy [facility] for tasks assessed/performed            Past Medical History:  Diagnosis Date   Arthritis    Basal cell carcinoma 02/17/2020   infl & ulcerated-Left forehead (MOHS)   Coronary artery disease    GERD (gastroesophageal reflux disease)    H/O bronchitis    H/O hiatal hernia    History of kidney stones last in 1976   Hyperlipidemia    statin intolerant, under control   Hypertension    under control   Hypertensive retinopathy    OU   Hypothyroidism    Moderate persistent asthma without complication 06/09/2019   Retinal detachment    OD   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   left helix tx cx3 17fu   Sleep apnea    hx of, surgery to reverse   Past Surgical History:  Procedure Laterality Date   CARDIAC CATHETERIZATION Right 2008   5 heart stents   CATARACT EXTRACTION Bilateral 6 years ago   CYSTOSCOPY N/A 08/06/2012   Procedure: CYSTOSCOPY;  Surgeon: Willye Harvey, MD;  Location: [facility];  Service: Urology;  Laterality: N/A;   ESOPHAGOGASTRIC FUNDOPLICATION  1985   spleen removed, 5 pints of blood given   EYE SURGERY     HERNIA REPAIR  1985   x4   INGUINAL HERNIA REPAIR  1992   PACEMAKER IMPLANT N/A 10/30/2018   Procedure: PACEMAKER IMPLANT;  Surgeon: Lei Pump, MD;  Location: [facility] INV[facility];  Service:  Cardiovascular;  Laterality: N/A;   PACEMAKER INSERTION     PPM GENERATOR REMOVAL N/A 10/25/2018   Procedure: PPM GENERATOR REMOVAL;  Surgeon: Lei Pump, MD;  Location: [facility] CV [facility];  Service: Cardiovascular;  Laterality: N/A;   PROSTATE ABLATION  2013   laser ablation   RETINAL DETACHMENT SURGERY Right 01/24/2019   Pneumatic cryopexy - Dr. Ronelle Coffee   TEE WITHOUT CARDIOVERSION N/A 10/25/2018   Procedure: TRANSESOPHAGEAL ECHOCARDIOGRAM (TEE);  Surgeon: Alroy Aspen Lela Purple, MD;  Location: [facility] ENDOSCOPY;  Service: Cardiovascular;  Laterality: N/A;   TEMPORARY PACEMAKER N/A 10/25/2018   Procedure: TEMPORARY PACEMAKER;  Surgeon: Lei Pump, MD;  Location: [facility];  Service: Cardiovascular;  Laterality: N/A;   TONSILLECTOMY  as child   TOTAL KNEE ARTHROPLASTY Right 08/03/2014   Procedure: TOTAL RIGHT KNEE ARTHROPLASTY;  Surgeon: Aurther Blue, MD;  Location: [facility];  Service: Orthopedics;  Laterality: Right;   TRANSURETHRAL RESECTION OF PROSTATE N/A 08/06/2012   Procedure: Claudie Cull OF PROSTATE WITH GYRUS ;  Surgeon: Willye Harvey, MD;  Location: [facility];  Service: Urology;  Laterality: N/A;   UMBILICAL HERNIA REPAIR  1990   UVULECTOMY  2005   for  sleep apnea   UVULOPALATOPHARYNGOPLASTY  6 years ago   Patient Active Problem List   Diagnosis Date Noted   Muscle spasms of neck 07/16/2023   Acute cough 06/05/2023   Abnormal EKG 06/05/2023   Idiopathic hypotension 06/05/2023   Myalgia, shoulder region 01/24/2023   Chronic pain syndrome 12/27/2022   Prediabetes 07/05/2022   Cervico-occipital neuralgia of right side 04/17/2022   Chronic tension-type headache, not intractable 02/27/2022   Postural kyphosis of cervicothoracic region 02/27/2022   Moderate aortic stenosis 10/27/2020   Thoracic aortic aneurysm (HCC) 03/12/2020   Sinus node dysfunction (HCC) 02/17/2020   Moderate persistent asthma without complication 06/09/2019   Chronic bronchitis,  mucopurulent (HCC) 06/09/2019   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   Myofascial neck pain 04/01/2019   Degenerative disc disease, cervical 04/01/2019   Hypothyroidism    Presence of permanent cardiac pacemaker 10/23/2018   Sleep apnea 10/23/2018   Benign prostatic hyperplasia without lower urinary tract symptoms 05/21/2017   Seasonal allergic rhinitis due to pollen 05/18/2017   Chronic renal disease, stage 3, moderately decreased glomerular filtration rate (GFR) between 30-59 mL/min/1.73 square meter (HCC) 06/14/2016   Gastroesophageal reflux disease without esophagitis 12/19/2015   Essential hypertension, benign 12/19/2015   Hyperlipidemia with target LDL less than 70 12/19/2015   OA (osteoarthritis) of knee 08/03/2014   Coronary artery disease 07/10/2014    PCP: Arcadio Knuckles, MD  REFERRING PROVIDER: Dawley, Colby Daub, DO  REFERRING DIAG: M54.2 (ICD-10-CM) - Cervicalgia  THERAPY DIAG:  Abnormal posture  Cervicalgia  Muscle weakness (generalized)  Other abnormalities of gait and mobility  Rationale for Evaluation and Treatment: Rehabilitation  ONSET DATE: ~2 years  SUBJECTIVE:                                                                                                                                                                                                         SUBJECTIVE STATEMENT:  Pt states the pain is worse today from running around and lifting/carrying with his [employer] [facility] [employer] repairs.     Eval:  I got a problem in my neck. 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 Can't straighten up well. Pt states that while standing on his feet, the upper midback muscles tighten up and gets painful. Pt states he's been getting nerve blocks once a month which helps but doesn't fix the problem. Interested in dry needling.  Hand dominance: Right  PERTINENT HISTORY:  Bilat shoulder pain, R TKA  PAIN:  Are you having pain? Yes: NPRS scale: 5at rest,  standing up  to 7-9 Pain location: R mid/upper thoracic Pain description: tightness, just hurts Aggravating factors: Standing ~15 min, walking flat ~1/4-1/2 mile, up a hill quicker Relieving factors: Sitting and stretching out,   PRECAUTIONS: None  RED FLAGS: None   WEIGHT BEARING RESTRICTIONS: No  FALLS:  Has patient fallen in last 6 months? 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  DIAGNOSTIC FINDINGS:  12/22/22 CT R shoulder IMPRESSION: 1. Partial thickness bursal surface tear of the supraspinatus tendon 2.7 cm from the peripheral insertion. 2. Mild osteoarthritis of the glenohumeral joint. 3. 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 Recommend annual imaging followup by CTA or MRA. This recommendation follows 2010 ACCF/AHA/AATS/ACR/ASA/SCA/SCAI/SIR/STS/SVM Guidelines for the Diagnosis and Management of Patients with Thoracic Aortic Disease. Circulation. 2010; 121: W098-J191. Aortic aneurysm NOS (ICD10-I71.9) 4.  Aortic Atherosclerosis (ICD10-I70.0).  10/20/22 MRI L shoulder IMPRESSION: 1. Mild tendinosis of the supraspinatus and subscapularis tendons. 2. Mild subacromial/subdeltoid bursitis.  04/01/19 Cervical spine x-ray IMPRESSION: Advanced degenerative facet disease bilaterally. Multilevel bilateral neural foraminal narrowing, left greater than right. No acute bony abnormality.   PATIENT SURVEYS:  Neck Disability Index score: 16 / 50 = 32.0 % (reports sleep is not disturbed by his neck pain so marked no trouble sleeping but it is disturbed by other medical  issues)   6/19 Neck Disability Index score: 13/ 50   COGNITION: Overall cognitive status: Within functional limits for tasks assessed  SENSATION: WFL  POSTURE: rounded shoulders, forward head, decreased lumbar lordosis, and increased thoracic kyphosis  PALPATION: TTP R>L thoracic paraspinal, rhomboids   CERVICAL ROM:   Active ROM A/PROM (deg) eval 6/19  Flexion 22 25   Extension 18* 20 p!  Right lateral flexion 15* 20 p!  Left lateral flexion 15* 15p!  Right rotation 60 60 p!  Left rotation 40 40   (Blank rows = not tested, * = pain)  UPPER EXTREMITY ROM:   Active ROM Right eval Left eval  Shoulder flexion ~150 ~150  Shoulder extension    Shoulder abduction ~150 ~150  Shoulder adduction    Shoulder extension    Shoulder internal rotation To L5 To L4  Shoulder external rotation ~80 deg ~80 deg  Elbow flexion    Elbow extension    Wrist flexion    Wrist extension    Wrist ulnar deviation    Wrist radial deviation    Wrist pronation    Wrist supination     (Blank rows = not tested)  UPPER EXTREMITY MMT: deferred due ot pain  MMT Right eval Left eval  Shoulder flexion 5 5  Shoulder extension 5 5  Shoulder abduction 5 5  Shoulder adduction    Shoulder extension    Shoulder internal rotation 5 5  Shoulder external rotation 4 5  Middle trapezius (prone) 3 3  Lower trapezius (prone) unable unable  Elbow flexion    Elbow extension    Wrist flexion    Wrist extension    Wrist ulnar deviation    Wrist radial deviation    Wrist pronation    Wrist supination    Grip strength     (Blank rows =  not tested)   TREATMENT DATE:   6/19  Bilat C/S STM paraspinals Bilat UT STM STM R rhomboid   6/9  Bilat C/S STM paraspinals Bilat UT STM STM R rhomboid  Self chair t/s extension 3s 10x Pec stretch 30s 3x ea T/S ext with towel in chair 2s 10x  YTB horizontal ABD 10x  6/5 Bilat C/S STM paraspinals Bilat UT STM Light manual cervical  traction  Wall thoracic ext 2x10 2s hold Chin tucks 15x RTB row 3x10 Self massage with ball   Postural changes throughout the day, reading position, static posture vs movement related to pain, self STM, massage therapy options   10/05/23 Manual therapy Trigger Point Dry Needling  Initial Treatment: Pt instructed on Dry Needling rational, procedures, and possible side effects. Pt instructed to expect mild to moderate muscle soreness later in the day and/or into the next day.  Pt instructed in methods to reduce muscle soreness. Pt instructed to continue prescribed HEP. Because Dry Needling was performed over or adjacent to a lung field, pt was educated on S/S of pneumothorax and to seek immediate medical attention should they occur.  Patient was educated on signs and symptoms of infection and other risk factors and advised to seek medical attention should they occur.  Patient verbalized understanding of these instructions and education.   Patient Verbal Consent Given: Yes Education Handout Provided: Yes Muscles Treated: R thoracic paraspinal Electrical Stimulation Performed: No Treatment Response/Outcome: Twitch response  See HEP below                                                                                                                                 PATIENT EDUCATION:  Education details: Exam findings, HEP, initial HEP Person educated: Patient Education method: Explanation, Demonstration, and Handouts Education comprehension: verbalized understanding, returned demonstration, and needs further education  HOME EXERCISE PROGRAM: Access Code: [email or web address] URL: [email or web address] Date: 10/05/2023 Prepared by: Gellen April Erman Hayward  Exercises - Seated Thoracic Extension AROM  - 1 x daily - 7 x weekly - 1 sets - 10 reps - Seated Cervical Retraction  - 1 x daily - 7 x weekly - 3 sets - 10 reps - Seated Scapular Retraction  - 1 x daily - 7 x weekly - 3  sets - 10 reps - Standing Paraspinals Mobilization with Small Ball on Wall  - 1 x daily - 7 x weekly - 2 sets - 10 reps  ASSESSMENT:  CLINICAL IMPRESSION: Patient with increase in baseline pain at beginning of today's session that is concurrent with report of increased tearing and lifting activity.  Patient does have subjective improvement since previous session as well as active range of motion improvement but is limited with pain so strength testing deferred today.  Progress note today performed in order to extend plan of care.  Patient is still greatly limited with upper quarter range of motion and strength in his unable to perform all  ADL due to his current pain.  Plan of care affected by patient's summer schedule so extended today for flexibility scheduling.  Patient would benefit from continued skilled therapy in order to address functional deficits for improvement performance of daily functions as well as based on level pain  OBJECTIVE IMPAIRMENTS: decreased activity tolerance, decreased endurance, decreased mobility, decreased ROM, decreased strength, hypomobility, increased fascial restrictions, increased muscle spasms, impaired flexibility, impaired UE functional use, improper body mechanics, postural dysfunction, and pain.   ACTIVITY LIMITATIONS: carrying, lifting, bathing, toileting, dressing, self feeding, reach over head, hygiene/grooming, and caring for others  PARTICIPATION LIMITATIONS: meal prep, cleaning, laundry, shopping, community activity, and yard work  PERSONAL FACTORS: Age, Fitness, Past/current experiences, and Time since onset of injury/illness/exacerbation are also affecting patient's functional outcome.   REHAB POTENTIAL: Good  CLINICAL DECISION MAKING: Evolving/moderate complexity  EVALUATION COMPLEXITY: Moderate   GOALS: Goals reviewed with patient? Yes  SHORT TERM GOALS: Target date: 10/26/2023   Pt will be ind with initial HEP Baseline:  Goal status:  ongoing  2.  Pt will demo pain free cervical ROM Baseline:  Goal status: ongoing  LONG TERM GOALS: Target date: POC DATE   Pt will be ind with management and progression of HEP Baseline:  Goal status: ongoing  2.  Pt will demo at least 10 deg improvement in cervical ROM for working on his cars Baseline:  Goal status: ongoing  3.  Pt will be able to tolerate standing >/=20 min to go grocery shopping with his wife  Baseline:  Goal status: partially met  4.  Pt will be able to tolerate prone cervical/thoracic ext test to at least 30 sec to demo increasing trunk extensor endurance Baseline:  Goal status: ongoing  5.  Pt will have improved NDI to </=22% to demo MCID Baseline:  Goal status: ongoing 26%     PLAN:  PT FREQUENCY: 1-2 2x/week  PT DURATION: 12 wks  PLANNED INTERVENTIONS: 97164- PT Re-evaluation, 97110-Therapeutic exercises, 97530- Therapeutic activity, 97112- Neuromuscular re-education, 97535- Self Care, 44034- Manual therapy, [phone]- Aquatic Therapy, [id number]- Electrical stimulation (unattended), [phone]- Ionotophoresis 4mg /ml Dexamethasone , Patient/Family education, Balance training, Stair training, Taping, Dry Needling, Joint mobilization, Spinal mobilization, Cryotherapy, Moist heat, and Biofeedback  PLAN FOR NEXT SESSION: Assess response to HEP. Work on Nurse, adult with mobs as indicated. Improve midback and trunk extensor strengthening. Manual work/dry needling as indicated for pt's muscular tightness.    Silver Dross, PT 11/22/2023, 3:25 PM

## 2023-11-26 ENCOUNTER — Telehealth (HOSPITAL_BASED_OUTPATIENT_CLINIC_OR_DEPARTMENT_OTHER): Payer: Self-pay

## 2023-11-26 ENCOUNTER — Ambulatory Visit (HOSPITAL_BASED_OUTPATIENT_CLINIC_OR_DEPARTMENT_OTHER)

## 2023-11-26 NOTE — Telephone Encounter (Signed)
 Called and spoke with pt regarding missed appt. He stated he cancelled today's appt verbally last time he was here. Plans to attend next scheduled visit.

## 2023-11-29 ENCOUNTER — Ambulatory Visit (HOSPITAL_BASED_OUTPATIENT_CLINIC_OR_DEPARTMENT_OTHER): Admitting: Physical Therapy

## 2023-12-06 ENCOUNTER — Other Ambulatory Visit: Payer: Self-pay | Admitting: Internal Medicine

## 2023-12-10 ENCOUNTER — Encounter: Payer: Self-pay | Admitting: Physical Medicine and Rehabilitation

## 2023-12-10 ENCOUNTER — Encounter: Attending: Physical Medicine and Rehabilitation | Admitting: Physical Medicine and Rehabilitation

## 2023-12-10 VITALS — BP 101/66 | HR 65 | Ht 72.0 in | Wt 198.2 lb

## 2023-12-10 DIAGNOSIS — M5481 Occipital neuralgia: Secondary | ICD-10-CM | POA: Diagnosis not present

## 2023-12-10 DIAGNOSIS — M542 Cervicalgia: Secondary | ICD-10-CM | POA: Diagnosis not present

## 2023-12-10 DIAGNOSIS — I951 Orthostatic hypotension: Secondary | ICD-10-CM

## 2023-12-10 NOTE — Patient Instructions (Signed)
-   Resume Usual Activities. Notify Physician of any unusual bleeding, erythema or concern for side effects as reviewed above. - Apply ice prn for pain - Tylenol  and tramadol  prn for pain - Follow up PRN Q1M for TPI and GONB - Take your BP first thing in the morning daily - I will message your PCP and you make an appointment regarding recent lightheadedness and low BP

## 2023-12-10 NOTE — Progress Notes (Signed)
 HPI: [name] Glass is a 83 y.o. 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 who presents to clinic for treatment of pain related to chronic Right  sided headaches and cervicalgia  via injection as described below.    He's lightheaded this morning going into a restaurant this morning and coming into the office today. He denies a lot recent activity, is well hydrated. Has been happening intermittently for awhile. Needs to follow up with his PCP      Physical Exam:  General: Appropriate appearance for age.  Mental Status: Appropriate mood and affect.  Cardiovascular: RRR, no m/r/g.  Respiratory: CTAB, no rales/rhonchi/wheezing.  Skin: No apparent rashes or lesions.  Neuro: Awake, alert, and oriented x3. No apparent deficits.  MSK: Moving all 4 limbs antigravity and against resistance.    + TTP with palpable trigger points in R cervical paraspinals,  R trapezius, R levator scapulae, and R  infraspinatus.       PROCEDURE:  Right  greater occipital nerve block and bilateral trigger point injections 1. Myofascial neck pain   2. Cervico-occipital neuralgia of right side          Goals with treatment: [ x ] Decrease pain [ x ] Improve Active / Passive ROM [ x ] Improve ADLs [  ] Improve functional mobility   MEDICATION:  [ x ] Kenalog  40 mg/mL  [ X ] Lidocaine  1%      CONSENT: Obtained in writing per policy. Consent uploaded to chart.   Benefits discussed.  Risks discussed included, but were not limited to, pain and discomfort, bleeding, bruising, allergic reaction, infection. All questions answered to patient/family member/guardian/ caregiver satisfaction. They would like to proceed with procedure. There are no noted contraindications to procedure.   PROCEDURE Time out was preformed No heat sources No antibiotics   The patient was explained about both the benefits and risks of a  Right  greater occipital nerve block and bilateral trigger point injections as above. After the patient acknowledged an understanding of the risks and benefits, the patient agreed to proceed. The area was first marked and then prepped in an aseptic fashion with betadine / alcohol. A 30 g, 1/2 inch needle was directed via a direct approach into the   R cervical paraspinals,  R trapezius, R levator scapulae, and R infraspinatus.. The injection was completed with Kenalog  40 mg/ml 1 cc mixed with 2 cc of 1% lidocaine  after no blood was aspirated on pull back. Then, another syringe of the same mixture was directed 1/3 between the inion and the Right  mastoid bone, just medial to the occipital artery, and after no blood on drawback 3 cc was injected.   No complications were encountered. The patient tolerated the procedure well.   He sat  in the room 5+ minutes after injection to check for symptoms of lightheadedness/dizziness prior to being released form the clinic, and left asymptomatic.     Impression: HPI: [name] Glass is a 83 y.o. 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 who presents to clinic for treatment of R cervicalgia and headache pain . They received a  Right  greater occipital nerve block and bilateral trigger point injections as above.   PLAN: - Resume Usual Activities. Notify Physician of any unusual bleeding, erythema or concern for side effects as reviewed above. - Apply ice prn for pain - Tylenol  and tramadol  prn for pain - Follow up PRN Q1M for TPI and GONB'  Orthostatic hypotension - Take your BP first thing in the morning daily - I will message your PCP and you make an appointment regarding recent lightheadedness and low BP

## 2023-12-11 MED ORDER — LIDOCAINE HCL 1 % IJ SOLN: 6.0000 mL | Freq: Once | INTRAMUSCULAR | Status: DC

## 2023-12-11 MED ORDER — TRIAMCINOLONE ACETONIDE 40 MG/ML IJ SUSP: 16.0000 mg | Freq: Once | INTRAMUSCULAR | Status: DC

## 2023-12-13 NOTE — Progress Notes (Signed)
 Remote pacemaker transmission.

## 2023-12-16 DIAGNOSIS — I951 Orthostatic hypotension: Secondary | ICD-10-CM | POA: Insufficient documentation

## 2023-12-31 ENCOUNTER — Other Ambulatory Visit: Payer: Self-pay | Admitting: Internal Medicine

## 2023-12-31 DIAGNOSIS — E291 Testicular hypofunction: Secondary | ICD-10-CM

## 2024-01-07 ENCOUNTER — Ambulatory Visit: Payer: Self-pay

## 2024-01-07 ENCOUNTER — Other Ambulatory Visit: Payer: Self-pay | Admitting: Internal Medicine

## 2024-01-07 ENCOUNTER — Ambulatory Visit: Payer: Self-pay | Admitting: Internal Medicine

## 2024-01-07 ENCOUNTER — Ambulatory Visit (INDEPENDENT_AMBULATORY_CARE_PROVIDER_SITE_OTHER): Admitting: Internal Medicine

## 2024-01-07 ENCOUNTER — Ambulatory Visit (INDEPENDENT_AMBULATORY_CARE_PROVIDER_SITE_OTHER)

## 2024-01-07 ENCOUNTER — Telehealth: Payer: Self-pay

## 2024-01-07 ENCOUNTER — Other Ambulatory Visit (HOSPITAL_COMMUNITY): Payer: Self-pay

## 2024-01-07 ENCOUNTER — Encounter: Payer: Self-pay | Admitting: Internal Medicine

## 2024-01-07 VITALS — BP 124/76 | HR 59 | Temp 98.3°F | Ht 72.0 in | Wt 195.2 lb

## 2024-01-07 DIAGNOSIS — Z85828 Personal history of other malignant neoplasm of skin: Secondary | ICD-10-CM | POA: Diagnosis not present

## 2024-01-07 DIAGNOSIS — Z85820 Personal history of malignant melanoma of skin: Secondary | ICD-10-CM | POA: Diagnosis not present

## 2024-01-07 DIAGNOSIS — L905 Scar conditions and fibrosis of skin: Secondary | ICD-10-CM | POA: Diagnosis not present

## 2024-01-07 DIAGNOSIS — D225 Melanocytic nevi of trunk: Secondary | ICD-10-CM | POA: Diagnosis not present

## 2024-01-07 DIAGNOSIS — E039 Hypothyroidism, unspecified: Secondary | ICD-10-CM | POA: Diagnosis not present

## 2024-01-07 DIAGNOSIS — J22 Unspecified acute lower respiratory infection: Secondary | ICD-10-CM | POA: Insufficient documentation

## 2024-01-07 DIAGNOSIS — E785 Hyperlipidemia, unspecified: Secondary | ICD-10-CM

## 2024-01-07 DIAGNOSIS — N1831 Chronic kidney disease, stage 3a: Secondary | ICD-10-CM | POA: Diagnosis not present

## 2024-01-07 DIAGNOSIS — E291 Testicular hypofunction: Secondary | ICD-10-CM

## 2024-01-07 DIAGNOSIS — R051 Acute cough: Secondary | ICD-10-CM

## 2024-01-07 DIAGNOSIS — L570 Actinic keratosis: Secondary | ICD-10-CM | POA: Diagnosis not present

## 2024-01-07 LAB — BASIC METABOLIC PANEL WITH GFR
BUN: 26 mg/dL — ABNORMAL HIGH (ref 6–23)
CO2: 25 meq/L (ref 19–32)
Calcium: 10 mg/dL (ref 8.4–10.5)
Chloride: 106 meq/L (ref 96–112)
Creatinine, Ser: 1.33 mg/dL (ref 0.40–1.50)
GFR: 49.43 mL/min — ABNORMAL LOW (ref >60.00)
Glucose, Bld: 104 mg/dL — ABNORMAL HIGH (ref 70–99)
Potassium: 4.1 meq/L (ref 3.5–5.1)
Sodium: 141 meq/L (ref 135–145)

## 2024-01-07 LAB — TSH: TSH: 0.16 u[IU]/mL — ABNORMAL LOW (ref 0.35–5.50)

## 2024-01-07 LAB — HEPATIC FUNCTION PANEL
ALT: 11 U/L (ref 0–53)
AST: 14 U/L (ref 0–37)
Albumin: 4.3 g/dL (ref 3.5–5.2)
Alkaline Phosphatase: 51 U/L (ref 39–117)
Bilirubin, Direct: 0.1 mg/dL (ref 0.0–0.3)
Total Bilirubin: 0.5 mg/dL (ref 0.2–1.2)
Total Protein: 7.4 g/dL (ref 6.0–8.3)

## 2024-01-07 LAB — LIPID PANEL
Cholesterol: 145 mg/dL (ref 0–200)
HDL: 43.5 mg/dL (ref >39.00)
LDL Cholesterol: 71 mg/dL (ref 0–99)
NonHDL: 101.21
Total CHOL/HDL Ratio: 3
Triglycerides: 151 mg/dL — ABNORMAL HIGH (ref 0.0–149.0)
VLDL: 30.2 mg/dL (ref 0.0–40.0)

## 2024-01-07 LAB — CBC WITH DIFFERENTIAL/PLATELET
Basophils Absolute: 0 K/uL (ref 0.0–0.1)
Basophils Relative: 0.5 % (ref 0.0–3.0)
Eosinophils Absolute: 0.2 K/uL (ref 0.0–0.7)
Eosinophils Relative: 2 % (ref 0.0–5.0)
HCT: 45.2 % (ref 39.0–52.0)
Hemoglobin: 14.9 g/dL (ref 13.0–17.0)
Lymphocytes Relative: 22.8 % (ref 12.0–46.0)
Lymphs Abs: 1.9 K/uL (ref 0.7–4.0)
MCHC: 33 g/dL (ref 30.0–36.0)
MCV: 104.6 fl — ABNORMAL HIGH (ref 78.0–100.0)
Monocytes Absolute: 1.2 K/uL — ABNORMAL HIGH (ref 0.1–1.0)
Monocytes Relative: 14.5 % — ABNORMAL HIGH (ref 3.0–12.0)
Neutro Abs: 5 K/uL (ref 1.4–7.7)
Neutrophils Relative %: 60.2 % (ref 43.0–77.0)
Platelets: 259 K/uL (ref 150.0–400.0)
RBC: 4.32 Mil/uL (ref 4.22–5.81)
RDW: 14.3 % (ref 11.5–15.5)
WBC: 8.4 K/uL (ref 4.0–10.5)

## 2024-01-07 LAB — POC COVID19 BINAXNOW: SARS Coronavirus 2 Ag: NEGATIVE

## 2024-01-07 MED ORDER — ANASTROZOLE 1 MG PO TABS: 1.0000 mg | ORAL_TABLET | ORAL | 0 refills | Status: DC

## 2024-01-07 MED ORDER — HYDROCODONE BIT-HOMATROP MBR 5-1.5 MG/5ML PO SOLN: 5.0000 mL | Freq: Three times a day (TID) | ORAL | 0 refills | Status: AC | PRN

## 2024-01-07 MED ORDER — CEFPODOXIME PROXETIL 200 MG PO TABS: 200.0000 mg | ORAL_TABLET | Freq: Two times a day (BID) | ORAL | 0 refills | Status: AC

## 2024-01-07 MED ORDER — UNITHROID 75 MCG PO TABS: 75.0000 ug | ORAL_TABLET | Freq: Every day | ORAL | 0 refills | Status: DC

## 2024-01-07 MED ORDER — TESTOSTERONE CYPIONATE 200 MG/ML IM SOLN: 100.0000 mg | INTRAMUSCULAR | 0 refills | Status: AC

## 2024-01-07 MED ORDER — PITAVASTATIN CALCIUM 4 MG PO TABS: 1.0000 | ORAL_TABLET | Freq: Every day | ORAL | 0 refills | Status: DC

## 2024-01-07 NOTE — Telephone Encounter (Signed)
 Pharmacy Patient Advocate Encounter   Received notification from CoverMyMeds that prior authorization for Testosterone  Cypionate 200MG /ML intramuscular solution  is required/requested.   Insurance verification completed.   The patient is insured through [facility] .   Per test claim: PA required; PA started via CoverMyMeds. KEY [id number] . Waiting for clinical questions to populate.

## 2024-01-07 NOTE — Telephone Encounter (Signed)
 FYI Only or Action Required?: FYI only for provider.  Patient was last seen in primary care on 06/05/2023 by Joshua Debby CROME, MD.  Called Nurse Triage reporting Cough, low grade fever, chest congestion, wheezing  Symptoms began several days ago.  Interventions attempted: Nothing.  Symptoms are: gradually worsening.  Triage Disposition: See HCP Within 4 Hours (Or PCP Triage)  Patient/caregiver understands and will follow disposition?: Yes    Copied from CRM [id number]. Topic: Clinical - Red Word Triage >> Jan 07, 2024  8:11 AM Aleatha BROCKS wrote: Red Word that prompted transfer to Nurse Triage: Patient wife Mliss says patient has had pneumonia for about a week and its getting worse and she is a bit worried about it Reason for Disposition  [1] MILD difficulty breathing (e.g., minimal/no SOB at rest, SOB with walking, pulse < 100) AND [2] still present when not coughing  Answer Assessment - Initial Assessment Questions Chest pain feeling like congestion and related to cough   1. ONSET: When did the cough begin?      Symptoms all started on Thursday 2. SEVERITY: How bad is the cough today?      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 SPUTUM: Describe the color of your sputum (e.g., none, dry cough; clear, white, yellow, green)     Yellow  4. HEMOPTYSIS: Are you coughing up any blood? If Yes, ask: How much? (e.g., flecks, streaks, tablespoons, etc.)     No 5. DIFFICULTY BREATHING: Are you having difficulty breathing? If Yes, ask: How bad is it? (e.g., mild, moderate, severe)      COPD difficulty breathing 6. FEVER: Do you have a fever? If Yes, ask: What is your temperature, how was it measured, and when did it start?     Low grade fever 7. CARDIAC HISTORY: Do you have any history of heart disease? (e.g., heart attack, congestive heart failure)      6 heart stents and pacemaker 8. LUNG HISTORY: Do you have any history of lung disease?  (e.g., pulmonary embolus, asthma,  emphysema)     COPD 9. PE RISK FACTORS: Do you have a history of blood clots? (or: recent major surgery, recent prolonged travel, bedridden)     No 10. OTHER SYMPTOMS: Do you have any other symptoms? (e.g., runny nose, wheezing, chest pain)       Wheezing, mild fever, congestion, headache, fatigue  Protocols used: Cough - Acute Productive-A-AH

## 2024-01-07 NOTE — Progress Notes (Signed)
 Subjective:  Patient ID: [name] Glass, male    DOB: [birth year]  Age: 83 y.o. MRN: [id number]  CC: Cough, Sore Throat, Fever (Low grade ), and Chest Congestion (Phlegm )   HPI [name] Glass presents for f/up ---  Discussed the use of AI scribe software for clinical note transcription with the patient, who gave verbal consent to proceed.  History of Present Illness [name] Glass is an 83 year old male who presents with a five-day history of low-grade fever, sore throat, cough, and congestion.  He has experienced a five-day history of low-grade fever, sore throat, cough, and congestion. The cough is productive of dark yellow to almost orange sputum. Symptoms began mildly and have worsened over the past few days, with a particularly difficult night last night due to frequent coughing fits that disrupted his sleep.  He mentions being 'always kind of short of breath' but does not report any chest pain. There is minimal runny nose. He has not performed any home testing for COVID-19 or flu and is unsure if he has been exposed to anyone with these illnesses.  He is not currently taking any blood pressure medication, having previously been on hydralazine , which he has not taken for six to eight months.      Outpatient Medications Prior to Visit  Medication Sig Dispense Refill   acyclovir  (ZOVIRAX ) 200 MG capsule TAKE 1 CAPSULE BY MOUTH THREE TIMES A DAY 270 capsule 0   ANORO ELLIPTA  62.5-25 MCG/ACT AEPB INHALE 1 PUFF BY MOUTH EVERY DAY 60 each 11   Cholecalciferol (VITAMIN D-3) 125 MCG (5000 UT) TABS Take 125 mcg by mouth daily.     ezetimibe  (ZETIA ) 10 MG tablet TAKE 1 TABLET BY MOUTH EVERY DAY 90 tablet 1   finasteride (PROSCAR) 5 MG tablet TAKE 1 TABLET BY MOUTH EVERY DAY 90 tablet 0   fluticasone  (FLONASE ) 50 MCG/ACT nasal spray SPRAY 2 SPRAYS INTO EACH NOSTRIL EVERY DAY 48 mL 1   ipratropium (ATROVENT ) 0.03 % nasal spray Place 2 sprays into both nostrils 3 (three) times daily as  needed for rhinitis. 30 mL 12   levocetirizine (XYZAL ) 5 MG tablet TAKE 1 TABLET BY MOUTH EVERY DAY IN THE EVENING 90 tablet 1   meloxicam  (MOBIC ) 15 MG tablet Take 1 tablet (15 mg total) by mouth daily. 90 tablet 2   montelukast  (SINGULAIR ) 10 MG tablet TAKE 1 TABLET BY MOUTH EVERYDAY AT BEDTIME 90 tablet 1   omeprazole  (PRILOSEC) 20 MG capsule Take by mouth.     potassium gluconate 595 (99 K) MG TABS tablet Take by mouth.     Probiotic Product (PRODIGEN) CAPS Take 1 capsule by mouth daily.     Simethicone  40 MG/0.6ML LIQD Take 0.6 mLs (40 mg total) by mouth 4 (four) times daily. 300 mL 0   Somatropin  (OMNITROPE ) 5 MG/1.5ML SOCT Inject into the skin.     tamsulosin  (FLOMAX ) 0.4 MG CAPS capsule TAKE 1 CAPSULE BY MOUTH EVERY DAY IN THE MORNING 90 capsule 1   anastrozole  (ARIMIDEX ) 1 MG tablet TAKE 1 TABLET TWICE A WEEK 24 tablet 0   ARMOUR THYROID  60 MG tablet TAKE 1 TABLET (60 MG TOTAL) BY MOUTH DAILY BEFORE BREAKFAST. 90 tablet 0   liothyronine  (CYTOMEL ) 25 MCG tablet TAKE 1 TABLET BY MOUTH EVERY DAY IN THE MORNING 90 tablet 0   Pitavastatin  Calcium  4 MG TABS TAKE 1 TABLET DAILY 90 tablet 0   Facility-Administered Medications Prior to Visit  Medication Dose Route  Frequency Provider Last Rate Last Admin   lidocaine  (XYLOCAINE ) 1 % (with pres) injection 6 mL  6 mL Other Once        triamcinolone  acetonide (KENALOG -40) injection 16 mg  16 mg Intramuscular Once         ROS Review of Systems  Constitutional:  Positive for chills and fever.  HENT:  Positive for sore throat. Negative for sinus pressure.   Eyes:  Negative for visual disturbance.  Respiratory:  Positive for cough and shortness of breath. Negative for chest tightness and wheezing.   Cardiovascular:  Negative for chest pain, palpitations and leg swelling.  Gastrointestinal:  Negative for abdominal pain, constipation, diarrhea, nausea and vomiting.  Genitourinary: Negative.  Negative for difficulty urinating and dysuria.   Musculoskeletal:  Positive for arthralgias. Negative for myalgias.  Skin: Negative.   Neurological:  Negative for dizziness, weakness and light-headedness.  Hematological:  Negative for adenopathy. Does not bruise/bleed easily.  Psychiatric/Behavioral: Negative.      Objective:  BP 124/76 (BP Location: Left Arm, Patient Position: Sitting, Cuff Size: Normal)   Pulse (!) 59   Temp 98.3 F (36.8 C) (Oral)   Ht 6' (1.829 m)   Wt 195 lb 3.2 oz (88.5 kg)   SpO2 96%   BMI 26.47 kg/m   BP Readings from Last 3 Encounters:  01/07/24 124/76  12/10/23 101/66  11/07/23 133/84    Wt Readings from Last 3 Encounters:  01/07/24 195 lb 3.2 oz (88.5 kg)  12/10/23 198 lb 3.2 oz (89.9 kg)  11/07/23 190 lb (86.2 kg)    Physical Exam Vitals reviewed.  Constitutional:      General: He is not in acute distress.    Appearance: He is not toxic-appearing or diaphoretic.  HENT:     Mouth/Throat:     Pharynx: Oropharyngeal exudate and posterior oropharyngeal erythema present. No pharyngeal swelling or uvula swelling.     Tonsils: No tonsillar exudate or tonsillar abscesses.  Eyes:     General: No scleral icterus. Cardiovascular:     Rate and Rhythm: Regular rhythm. Bradycardia present.     Heart sounds: Murmur heard.     Systolic murmur is present.     No diastolic murmur is present.  Pulmonary:     Effort: Pulmonary effort is normal.     Breath sounds: Examination of the right-lower field reveals rales. Examination of the left-lower field reveals rales. Rales present. No decreased breath sounds, wheezing or rhonchi.  Abdominal:     General: There is no distension.     Palpations: There is no mass.     Tenderness: There is no abdominal tenderness. There is no guarding.     Hernia: No hernia is present.  Musculoskeletal:        General: Normal range of motion.     Cervical back: Neck supple.     Right lower leg: No edema.     Left lower leg: No edema.  Lymphadenopathy:     Cervical: No  cervical adenopathy.  Skin:    General: Skin is warm and dry.  Neurological:     General: No focal deficit present.     Mental Status: He is alert. Mental status is at baseline.  Psychiatric:        Mood and Affect: Mood normal.        Behavior: Behavior normal.     Lab Results  Component Value Date   WBC 8.4 01/07/2024   HGB 14.9 01/07/2024   HCT  45.2 01/07/2024   PLT 259.0 01/07/2024   GLUCOSE 104 (H) 01/07/2024   CHOL 145 01/07/2024   TRIG 151.0 (H) 01/07/2024   HDL 43.50 01/07/2024   LDLDIRECT 75.0 07/05/2022   LDLCALC 71 01/07/2024   ALT 11 01/07/2024   AST 14 01/07/2024   NA 141 01/07/2024   K 4.1 01/07/2024   CL 106 01/07/2024   CREATININE 1.33 01/07/2024   BUN 26 (H) 01/07/2024   CO2 25 01/07/2024   TSH 0.16 (L) 01/07/2024   PSA 0.75 01/11/2023   INR 0.96 07/28/2014   HGBA1C 5.8 07/05/2022    CT SHOULDER RIGHT W CONTRAST Result Date: 01/02/2023 CLINICAL DATA:  Right shoulder pain, history of arthritis. EXAM: CT ARTHROGRAPHY OF THE RIGHT SHOULDER TECHNIQUE: Multidetector CT imaging was performed following the standard protocol after injection of dilute contrast into the joint. RADIATION DOSE REDUCTION: This exam was performed according to the departmental dose-optimization program which includes automated exposure control, adjustment of the mA and/or kV according to patient size and/or use of iterative reconstruction technique. COMPARISON:  None Available. FINDINGS: Rotator cuff: Partial thickness bursal surface tear of the supraspinatus tendon 2.7 cm from the peripheral insertion. Infraspinatus tendon is intact. Teres minor tendon is intact. Subscapularis tendon is intact. Muscles: No muscle atrophy. No intramuscular fluid collection or hematoma. Biceps Long Head: Intraarticular and extraarticular portions of the biceps tendon are intact. Acromioclavicular Joint: Mild arthropathy of the acromioclavicular joint. No subacromial/subdeltoid bursal fluid. Glenohumeral Joint:  Intraarticular contrast distending the joint capsule. Normal glenohumeral ligaments. Mild partial-thickness cartilage loss of the glenohumeral joint. Labrum: Intact. Bones: No fracture or dislocation. No aggressive osseous lesion. Other: No fluid collection or hematoma. 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 Mild thoracic aortic atherosclerosis. IMPRESSION: 1. Partial thickness bursal surface tear of the supraspinatus tendon 2.7 cm from the peripheral insertion. 2. Mild osteoarthritis of the glenohumeral joint. 3. 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 Recommend annual imaging followup by CTA or MRA. This recommendation follows 2010 ACCF/AHA/AATS/ACR/ASA/SCA/SCAI/SIR/STS/SVM Guidelines for the Diagnosis and Management of Patients with Thoracic Aortic Disease. Circulation. 2010; 121: Z733-z630. Aortic aneurysm NOS (ICD10-I71.9) 4.  Aortic Atherosclerosis (ICD10-I70.0). Electronically Signed   By: Julaine Blanch M.D.   On: 01/02/2023 06:50   DG FLUORO GUIDED NEEDLE PLC ASPIRATION/INJECTION LOC Result Date: 12/22/2022 CLINICAL DATA:  Chronic right shoulder pain. EXAM: RIGHT SHOULDER INJECTION UNDER FLUOROSCOPY COMPARISON:  None Available. FLUOROSCOPY: Radiation Exposure Index (as provided by the fluoroscopic device): 0.4 mGy Kerma PROCEDURE: The risks and benefits of the procedure were discussed with the patient, and written informed consent was obtained. The patient stated no history of allergy to contrast media. A formal timeout procedure was performed with the patient according to departmental protocol. The patient was placed supine on the fluoroscopy table and the right glenohumeral joint was identified under fluoroscopy. The skin overlying the right glenohumeral joint was subsequently cleaned with Betadine and a sterile drape was placed over the  area of interest. 2 ml 1% Lidocaine  was used to anesthetize the skin around the needle insertion site. A 22 gauge spinal needle was inserted into the right glenohumeral joint under fluoroscopy. 12 ml of contrast mixture (10 ml of sterile saline and 10 ml of Isovue -M 200 contrast) were injected into the right glenohumeral joint. The needle was removed and hemostasis was achieved. The patient was subsequently transferred to CT  for imaging. IMPRESSION: Technically successful right shoulder injection under fluoroscopy for CT. Electronically Signed   By: Elsie ONEIDA Shoulder M.D.   On: 12/22/2022 14:47   DG Chest 2 View Result Date: 01/07/2024 CLINICAL DATA:  Cough and shortness of breath for the past 5 days. EXAM: CHEST - 2 VIEW COMPARISON:  06/05/2023 FINDINGS: Normal-sized heart. Tortuous and partially calcified thoracic aorta. The lungs remain hyperexpanded with stable mild scarring at the lung bases. Stable right subclavian dual lead pacemaker. 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 Upper abdominal surgical clips. IMPRESSION: No acute abnormality. Stable changes of COPD. Electronically Signed   By: Elspeth Bathe M.D.   On: 01/07/2024 11:09     Assessment & Plan:  Acute cough -     DG Chest 2 View; Future -     POC COVID-19 BinaxNow  Acquired hypothyroidism- TSH is suppressed. Will lower the T4 dose. -     CBC with Differential/Platelet; Future -     TSH; Future -     Unithroid ; Take 1 tablet (75 mcg total) by mouth daily before breakfast.  Dispense: 90 tablet; Refill: 0 -     AMB Referral [facility] Care Management  Stage 3a chronic kidney disease (HCC)- Will avoid nephrotoxic agents   -     Basic metabolic panel with GFR; Future -     CBC with Differential/Platelet; Future  Hyperlipidemia with target LDL less than 70 -     Hepatic function panel; Future -     Lipid panel; Future -     Pitavastatin  Calcium ; Take 1 tablet (4 mg total) by mouth daily.  Dispense: 90 tablet; Refill: 0 -      AMB Referral [facility] Care Management  LRTI (lower respiratory tract infection) -     Cefpodoxime  Proxetil; Take 1 tablet (200 mg total) by mouth 2 (two) times daily for 7 days.  Dispense: 14 tablet; Refill: 0 -     HYDROcodone  Bit-Homatrop MBr; Take 5 mLs by mouth every 8 (eight) hours as needed for up to 8 days for cough.  Dispense: 120 mL; Refill: 0  Testicular hypofunction -     Anastrozole ; Take 1 tablet (1 mg total) by mouth every 3 (three) days.  Dispense: 24 tablet; Refill: 0 -     Testosterone  Cypionate; Inject 0.5 mLs (100 mg total) into the muscle once a week.  Dispense: 10 mL; Refill: 0     Follow-up: No follow-ups on file.  Debby Molt, MD

## 2024-01-08 ENCOUNTER — Ambulatory Visit: Payer: Self-pay

## 2024-01-08 NOTE — Telephone Encounter (Signed)
  FYI Only or Action Required?: FYI only for provider.  Patient was last seen in primary care on 01/07/2024 by Joshua Debby CROME, MD.  Called Nurse Triage reporting Conjunctivitis.  Symptoms began yesterday.  Interventions attempted: Nothing.  Symptoms are: unchanged.  Triage Disposition: Call PCP today Patient/caregiver understands and will follow disposition?: yes         Reason for Disposition  [1] Lots of yellow or green nasal discharge AND [2] present now AND [3] fever  Answer Assessment - Initial Assessment Questions 1. EYE DISCHARGE: Is the discharge in one or both eyes? What color is it? How much is there? When did the discharge start?      Both - yellow  2. REDNESS OF SCLERA: Is there redness in the white of the eye? If Yes, ask: Is it in one or both eyes? When did the redness start?     yes 3. EYELIDS: Are the eyelids red or swollen? If Yes, ask: How much?      Yes  4. VISION: Do you have blurred vision?     Yes clears up when wipes up pus  5. PAIN: Is there any pain? If Yes, ask: How bad is the pain? (Scale 0-10; or none, mild, moderate, severe)     none 6. CONTACT LENS: Do you wear contacts?     no 7. OTHER SYMPTOMS: Do you have any other symptoms? (e.g., fever, runny nose, cough)     Cough  Protocols used: Eye - Pus or Discharge-A-AH

## 2024-01-08 NOTE — Telephone Encounter (Signed)
 Copied from CRM #[id number]. Topic: General - Call Back - No Documentation >> Jan 08, 2024 12:53 PM Rea C wrote: Reason for CRM: Chiquita from [facility] called in regards to information:   Testerone solution- coverage is denied due to criteria not being met. Contraindication including cardiac, hepatic, and/or other issues.   Fax will be sent over with this information for the patient.

## 2024-01-08 NOTE — Telephone Encounter (Signed)
 Pharmacy Patient Advocate Encounter  Received notification from [facility] MEDICARE that Prior Authorization for Testosterone  Cypionate 200MG /ML intramuscular solution has been DENIED.  See denial reason below. No denial letter attached in CMM. Will attach denial letter to Media tab once received. Denied. We denied coverage for this drug because Testosterone  Cypionate did not meet Androgens Injectable Prior Authorization Criteria requirements. Prior Authorization requires members to meet certain criteria set by the plan before a drug is covered. Testosterone  Cypionate is approved when the member does not have any FDA labeled contraindications to therapy. In this case, the provider stated that the member has serious cardiac, hepatic, or renal disease, which is an FDA labeled contraindication to use Testosterone  Cypionate.  PA #/Case ID/Reference #: PA Case ID #: [id number]

## 2024-01-08 NOTE — Telephone Encounter (Signed)
 PLEASE BE ADVISED Clinical questions have been answered and PA submitted.TO PLAN. PA currently Pending.

## 2024-01-10 NOTE — Telephone Encounter (Signed)
 Patient is aware. States that he knows its going to be denied and he pays out of pocket,.

## 2024-01-11 ENCOUNTER — Other Ambulatory Visit: Payer: Self-pay | Admitting: Internal Medicine

## 2024-01-14 ENCOUNTER — Encounter: Payer: Self-pay | Admitting: Physical Medicine and Rehabilitation

## 2024-01-14 ENCOUNTER — Encounter: Attending: Physical Medicine and Rehabilitation | Admitting: Physical Medicine and Rehabilitation

## 2024-01-14 VITALS — BP 118/65 | HR 60 | Ht 72.0 in | Wt 199.8 lb

## 2024-01-14 DIAGNOSIS — M542 Cervicalgia: Secondary | ICD-10-CM | POA: Diagnosis not present

## 2024-01-14 DIAGNOSIS — M5481 Occipital neuralgia: Secondary | ICD-10-CM | POA: Insufficient documentation

## 2024-01-14 MED ORDER — TRIAMCINOLONE ACETONIDE 40 MG/ML IJ SUSP
5.0000 mg | Freq: Once | INTRAMUSCULAR | Status: AC
  Administered 2024-01-14 (×2): 5.2 mg via INTRAMUSCULAR

## 2024-01-14 MED ORDER — LIDOCAINE HCL 1 % IJ SOLN
6.0000 mL | Freq: Once | INTRAMUSCULAR | Status: AC
  Administered 2024-01-14 (×2): 6 mL

## 2024-01-14 NOTE — Progress Notes (Unsigned)
 HPI: [name] Glass is a 83 y.o. 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 who presents to clinic for treatment of pain related to chronic Right  sided headaches and cervicalgia  via injection as described below.    Patient just finished an antibiotic for a URI, has a toothache but just got it checked out by the dentist and are just watching for now. He says since last visit his orthostasis is better; he does not have it as much, only when he is making big positional changes quickly.      Physical Exam:  General: Appropriate appearance for age.  Mental Status: Appropriate mood and affect.  Cardiovascular: RRR, no m/r/g.  Respiratory: CTAB, no rales/rhonchi/wheezing.  Skin: No apparent rashes or lesions.  Neuro: Awake, alert, and oriented x3. No apparent deficits.  MSK: Moving all 4 limbs antigravity and against resistance.    + TTP with palpable trigger points in R cervical paraspinals,  R  trapezius, R levator scapulae, and R infraspinatus.       PROCEDURE:  Right  greater occipital nerve block and bilateral trigger point injections 1. Myofascial neck pain   2. Cervico-occipital neuralgia of right side            Goals with treatment: [ x ] Decrease pain [ x ] Improve Active / Passive ROM [ x ] Improve ADLs [  ] Improve functional mobility   MEDICATION:  [ x ] Kenalog  40 mg/mL  [ X ] Lidocaine  1%      CONSENT: Obtained in writing per policy. Consent uploaded to chart.   Benefits discussed.  Risks discussed included, but were not limited to, pain and discomfort, bleeding, bruising, allergic reaction, infection. All questions answered to patient/family member/guardian/ caregiver satisfaction. They would like to proceed with procedure. There are no noted contraindications to procedure.   PROCEDURE Time out was preformed No heat sources No antibiotics   The patient was explained about both the benefits and risks of a  Right  greater occipital nerve block and bilateral trigger point injections as above. After the patient acknowledged an understanding of the risks and benefits, the patient agreed to proceed. The area was first marked and then prepped in an aseptic fashion with betadine / alcohol. A 30 g, 1/2 inch needle was directed via a direct approach into the   R cervical paraspinals,  R trapezius, R levator scapulae, and R infraspinatus.. The injection was completed with Kenalog  40 mg/ml 1 cc mixed with 2 cc of 1% lidocaine  after no blood was aspirated on pull back. Then, another syringe of the same mixture was directed 1/3 between the inion and the Right  mastoid bone, just medial to the occipital artery, and after no blood on drawback 3 cc was injected.  No complications were encountered. The patient tolerated the procedure well.     Impression: HPI: [name] Glass is a 83 y.o. male with PMHx has Coronary artery disease; OA (osteoarthritis) of knee; Gastroesophageal  reflux disease without esophagitis; Essential hypertension, benign; Hyperlipidemia with target LDL less than 70; Chronic renal disease, stage 3, moderately decreased glomerular filtration rate (GFR) between 30-59 mL/min/1.73 square meter (HCC); Seasonal allergic rhinitis due to pollen; Benign prostatic hyperplasia without lower urinary tract symptoms; Presence of permanent cardiac pacemaker; Sleep apnea; Hypothyroidism; Myofascial neck pain; Degenerative disc disease, cervical; Hypogonadism male; Moderate persistent asthma without complication; Chronic bronchitis, mucopurulent (HCC); Sinus node dysfunction (HCC); Thoracic aortic aneurysm (HCC); Moderate aortic stenosis; Chronic tension-type headache, not intractable; Postural kyphosis of cervicothoracic region; Cervico-occipital neuralgia of right side; Prediabetes; Chronic pain syndrome; Myalgia, shoulder region; Acute cough; Abnormal EKG; Idiopathic hypotension; and Muscle spasms of neck on their problem list. who presents to clinic for treatment of R cervicalgia and headache pain . They received a  Right  greater occipital nerve block and bilateral trigger point injections as above.   PLAN: - Resume Usual Activities. Notify Physician of any unusual bleeding, erythema or concern for side effects as reviewed above. - Apply ice prn for pain - Tylenol  and tramadol  prn for pain - Follow up PRN Q1M for TPI and GONB

## 2024-01-14 NOTE — Patient Instructions (Signed)
-   Resume Usual Activities. Notify Physician of any unusual bleeding, erythema or concern for side effects as reviewed above. - Apply ice prn for pain - Tylenol  and tramadol  prn for pain - Follow up PRN Q1M for TPI and GONB

## 2024-01-17 ENCOUNTER — Other Ambulatory Visit: Payer: Self-pay | Admitting: Internal Medicine

## 2024-01-17 DIAGNOSIS — E785 Hyperlipidemia, unspecified: Secondary | ICD-10-CM

## 2024-01-17 DIAGNOSIS — N400 Enlarged prostate without lower urinary tract symptoms: Secondary | ICD-10-CM

## 2024-01-18 ENCOUNTER — Other Ambulatory Visit: Payer: Self-pay | Admitting: Internal Medicine

## 2024-01-18 DIAGNOSIS — E039 Hypothyroidism, unspecified: Secondary | ICD-10-CM

## 2024-01-22 ENCOUNTER — Telehealth: Payer: Self-pay | Admitting: *Deleted

## 2024-01-22 NOTE — Progress Notes (Signed)
 Care Guide Pharmacy Note  01/22/2024 Name: [name] Glass MRN: [id number] DOB: December 03, 1940  Referred By: Joshua Debby CROME, MD Reason for referral: Complex Care Management (Outreach to schedule referral with pharmacist )   [name] is a 83 y.o. year old male who is a primary care patient of Joshua Debby CROME, MD.  Fairy FOR[place] was referred to the pharmacist for assistance related to: CAD  Successful contact was made with the patient to discuss pharmacy services including being ready for the pharmacist to call at least 5 minutes before the scheduled appointment time and to have medication bottles and any blood pressure readings ready for review. The patient agreed to meet with the pharmacist via telephone visit on 02/07/2024  Thedford Franks, CMA [place]  [facility], [facility] Guide Direct Dial: [phone]  Fax: [phone] Website: [email or web address]

## 2024-01-23 ENCOUNTER — Ambulatory Visit (INDEPENDENT_AMBULATORY_CARE_PROVIDER_SITE_OTHER): Payer: Medicare Other

## 2024-01-23 DIAGNOSIS — I495 Sick sinus syndrome: Secondary | ICD-10-CM

## 2024-01-24 LAB — CUP PACEART REMOTE DEVICE CHECK
Battery Remaining Longevity: 114 mo
Battery Voltage: 3 V
Brady Statistic AP VP Percent: 5.92 %
Brady Statistic AP VS Percent: 44.25 %
Brady Statistic AS VP Percent: 2.02 %
Brady Statistic AS VS Percent: 47.82 %
Brady Statistic RA Percent Paced: 50.19 %
Brady Statistic RV Percent Paced: 7.94 %
Date Time Interrogation Session: 20250819185144
Implantable Lead Connection Status: 753985
Implantable Lead Connection Status: 753985
Implantable Lead Implant Date: 20200527
Implantable Lead Implant Date: 20200527
Implantable Lead Location: 753859
Implantable Lead Location: 753860
Implantable Lead Model: 5076
Implantable Lead Model: 5076
Implantable Pulse Generator Implant Date: 20200527
Lead Channel Impedance Value: 342 Ohm
Lead Channel Impedance Value: 399 Ohm
Lead Channel Impedance Value: 456 Ohm
Lead Channel Impedance Value: 608 Ohm
Lead Channel Pacing Threshold Amplitude: 0.5 V
Lead Channel Pacing Threshold Amplitude: 0.625 V
Lead Channel Pacing Threshold Pulse Width: 0.4 ms
Lead Channel Pacing Threshold Pulse Width: 0.4 ms
Lead Channel Sensing Intrinsic Amplitude: 3.125 mV
Lead Channel Sensing Intrinsic Amplitude: 3.125 mV
Lead Channel Sensing Intrinsic Amplitude: 7.5 mV
Lead Channel Sensing Intrinsic Amplitude: 7.5 mV
Lead Channel Setting Pacing Amplitude: 1.5 V
Lead Channel Setting Pacing Amplitude: 2.5 V
Lead Channel Setting Pacing Pulse Width: 0.4 ms
Lead Channel Setting Sensing Sensitivity: 2 mV
Zone Setting Status: 755011
Zone Setting Status: 755011

## 2024-01-26 ENCOUNTER — Other Ambulatory Visit: Payer: Self-pay | Admitting: Family

## 2024-01-26 DIAGNOSIS — E039 Hypothyroidism, unspecified: Secondary | ICD-10-CM

## 2024-01-28 ENCOUNTER — Ambulatory Visit: Payer: Self-pay | Admitting: Cardiovascular Disease

## 2024-02-02 ENCOUNTER — Other Ambulatory Visit: Payer: Self-pay | Admitting: Internal Medicine

## 2024-02-07 ENCOUNTER — Other Ambulatory Visit

## 2024-02-20 ENCOUNTER — Other Ambulatory Visit: Admitting: Pharmacist

## 2024-02-20 DIAGNOSIS — E039 Hypothyroidism, unspecified: Secondary | ICD-10-CM

## 2024-02-20 DIAGNOSIS — Z23 Encounter for immunization: Secondary | ICD-10-CM | POA: Diagnosis not present

## 2024-02-20 MED ORDER — LEVOTHYROXINE SODIUM 75 MCG PO TABS: 75.0000 ug | ORAL_TABLET | Freq: Every day | ORAL | 1 refills | Status: AC

## 2024-02-20 NOTE — Progress Notes (Signed)
   02/20/2024 Name: [name] Glass MRN: [id number] DOB: 03/20/1941  Chief Complaint  Patient presents with   Hypothyroidism    [name] Glass is a 83 y.o. year old male who presented for a telephone visit.   They were referred to the pharmacist by their PCP for assistance in managing hypothyroidism.    Subjective:  Care Team: Primary Care Provider: Joshua Debby CROME, MD ; Next Scheduled Visit: none scheduled   Medication Access/Adherence  Current Pharmacy:  [id number] [place], [place] - [address] [place] [address] Phone: [phone] Fax: [phone]  [phone] [place] [place] - [address] [place] [address] [place] [address] [place] [address] Phone: [phone] Fax: [phone]   Patient reports affordability concerns with their medications: No  Patient reports access/transportation concerns to their pharmacy: No  Patient reports adherence concerns with their medications:  Yes     Hypothyroidism: Contacted patient regarding hypothyroidism medication change from August. PCP stopped Armour Thyroid  and liothyronine  and prescribed Unithroid . Pt has been unable to get Unithroid  from their pharmacy. He thought it was due to the medication being sent to the wrong pharmacy however per chart, it was sent to the correct pharmacy.   Pt has been out of Armour Thyroid  but is still taking liothyronine .    Objective:  Lab Results  Component Value Date   HGBA1C 5.8 07/05/2022    Lab Results  Component Value Date   CREATININE 1.33 01/07/2024   BUN 26 (H) 01/07/2024   NA 141 01/07/2024   K 4.1 01/07/2024   CL 106 01/07/2024   CO2 25 01/07/2024    Lab Results  Component Value Date   CHOL 145 01/07/2024   HDL 43.50 01/07/2024   LDLCALC 71 01/07/2024   LDLDIRECT 75.0 07/05/2022   TRIG 151.0 (H) 01/07/2024   CHOLHDL 3 01/07/2024    Medications Reviewed Today    Medications were not reviewed in this encounter       Assessment/Plan:   [facility] [place] [facility] the pharmacist who noted Unithroid  has been on back order since August.  Sent levothyroxine  75 mcg daily  Informed pt this will replace both Armour Thyroid  and liothyronine  Advised pt to schedule 3 month PCP follow up   Follow Up Plan: PCP f/u in November  Darrelyn Drum, PharmD, BCPS, CPP Clinical Pharmacist Practitioner [facility] [phone]

## 2024-02-20 NOTE — Patient Instructions (Signed)
 It was a pleasure speaking with you today!  A prescription for levothyroxine  75 mcg has been [facility] [place] to replace both Armour Thyroid  and liothyronine . You can contact me at my direct number below if you have any issues getting the prescription.   I also wanted to mention that Dr. Joshua wanted to see you back in 3 months to recheck your thyroid  level. You can contact the front desk or schedule an appointment using MyChart for early November.  Feel free to call with any questions or concerns!  Darrelyn Drum, PharmD, BCPS, CPP Clinical Pharmacist Practitioner [facility] [phone]

## 2024-02-27 NOTE — Progress Notes (Signed)
 Remote PPM Transmission

## 2024-03-05 ENCOUNTER — Encounter: Attending: Physical Medicine and Rehabilitation | Admitting: Physical Medicine and Rehabilitation

## 2024-03-05 ENCOUNTER — Encounter: Payer: Self-pay | Admitting: Physical Medicine and Rehabilitation

## 2024-03-05 VITALS — BP 148/81 | HR 60 | Ht 72.0 in | Wt 201.2 lb

## 2024-03-05 DIAGNOSIS — M542 Cervicalgia: Secondary | ICD-10-CM

## 2024-03-05 DIAGNOSIS — M7918 Myalgia, other site: Secondary | ICD-10-CM | POA: Diagnosis not present

## 2024-03-05 DIAGNOSIS — M5481 Occipital neuralgia: Secondary | ICD-10-CM | POA: Diagnosis not present

## 2024-03-05 NOTE — Progress Notes (Signed)
 HPI: [name] Glass is a 83 y.o. 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 who presents to clinic for treatment of pain related to chronic Right  sided headaches and cervicalgia  via injection as described below.     No new concerns or complaints. No major changes in medical history since last visit. Has been to a car show in [place]  recently getting an award for his Thunder[employer], enjoyed his trip. He is not hurting too bad today, 2/10. Headaches are very slight on the right.    Physical Exam:  General: Appropriate appearance for age.  Mental Status: Appropriate mood and affect.  Cardiovascular: RRR, no m/r/g.  Respiratory: CTAB, no rales/rhonchi/wheezing.  Skin: No apparent rashes or lesions.  Neuro: Awake, alert, and oriented x3. No apparent deficits.  MSK: Moving all 4 limbs antigravity and against resistance.    + TTP with palpable trigger points in R GON's , R cervical paraspinals,  R trapezius, R  levator scapulae, and R infraspinatus.       PROCEDURE:  Right  greater occipital nerve block and bilateral trigger point injections 1. Myofascial neck pain   2. Cervico-occipital neuralgia of right side            Goals with treatment: [ x ] Decrease pain [ x ] Improve Active / Passive ROM [ x ] Improve ADLs [  ] Improve functional mobility   MEDICATION:  [ x ] Kenalog  40 mg/mL  [ X ] Lidocaine  1%      CONSENT: Obtained in writing per policy. Consent uploaded to chart.   Benefits discussed.  Risks discussed included, but were not limited to, pain and discomfort, bleeding, bruising, allergic reaction, infection. All questions answered to patient/family member/guardian/ caregiver satisfaction. They would like to proceed with procedure. There are no noted contraindications to procedure.   PROCEDURE Time out was preformed No heat sources No antibiotics   The patient was explained about both the benefits and risks of a  Right  greater occipital nerve block and bilateral trigger point injections as above. After the patient acknowledged an understanding of the risks and benefits, the patient agreed to proceed. The area was first marked and then prepped in an aseptic fashion with betadine / alcohol. A 30 g, 1/2 inch needle was directed via a direct approach into the   R cervical paraspinals,  R trapezius, R levator scapulae, and R infraspinatus.. The injection was completed with Kenalog  40 mg/ml 1 cc mixed with 2 cc of 1% lidocaine  after no blood was aspirated on pull back. Then, another syringe of the same mixture was directed 1/3 between the inion and the Right mastoid bone, just medial to the occipital artery, and after no blood on drawback 3 cc was injected.   No complications  were encountered. The patient tolerated the procedure well.     Impression: HPI: [name] Glass is a 83 y.o. 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 who presents to clinic for treatment of R cervicalgia and headache pain . They received a  Right  greater occipital nerve block and bilateral trigger point injections as above.   PLAN: - Resume Usual Activities. Notify Physician of any unusual bleeding, erythema or concern for side effects as reviewed above. - Apply ice prn for pain - Tylenol  and tramadol  prn for pain - Follow up PRN Q1M for TPI and GONB

## 2024-03-06 ENCOUNTER — Other Ambulatory Visit: Payer: Self-pay | Admitting: Internal Medicine

## 2024-03-11 MED ORDER — LIDOCAINE HCL 1 % IJ SOLN: 6.0000 mL | Freq: Once | INTRAMUSCULAR | Status: DC

## 2024-03-11 MED ORDER — TRIAMCINOLONE ACETONIDE 40 MG/ML IJ SUSP: 8.0000 mg | Freq: Once | INTRAMUSCULAR | Status: DC

## 2024-03-23 ENCOUNTER — Other Ambulatory Visit: Payer: Self-pay | Admitting: Pulmonary Disease

## 2024-03-23 DIAGNOSIS — J449 Chronic obstructive pulmonary disease, unspecified: Secondary | ICD-10-CM

## 2024-03-28 DIAGNOSIS — Z23 Encounter for immunization: Secondary | ICD-10-CM | POA: Diagnosis not present

## 2024-03-29 ENCOUNTER — Other Ambulatory Visit: Payer: Self-pay | Admitting: Internal Medicine

## 2024-03-29 DIAGNOSIS — E291 Testicular hypofunction: Secondary | ICD-10-CM

## 2024-04-02 ENCOUNTER — Encounter (HOSPITAL_BASED_OUTPATIENT_CLINIC_OR_DEPARTMENT_OTHER): Admitting: Physical Medicine and Rehabilitation

## 2024-04-02 ENCOUNTER — Encounter: Payer: Self-pay | Admitting: Physical Medicine and Rehabilitation

## 2024-04-02 VITALS — BP 97/58 | HR 64 | Ht 72.0 in | Wt 203.0 lb

## 2024-04-02 DIAGNOSIS — M7912 Myalgia of auxiliary muscles, head and neck: Secondary | ICD-10-CM

## 2024-04-02 DIAGNOSIS — M542 Cervicalgia: Secondary | ICD-10-CM | POA: Diagnosis not present

## 2024-04-02 DIAGNOSIS — M5481 Occipital neuralgia: Secondary | ICD-10-CM

## 2024-04-02 MED ORDER — LIDOCAINE HCL 1 % IJ SOLN: 6.0000 mL | Freq: Once | INTRAMUSCULAR | Status: DC

## 2024-04-02 MED ORDER — TRIAMCINOLONE ACETONIDE 40 MG/ML IJ SUSP: 5.0000 mg | Freq: Once | INTRAMUSCULAR | Status: DC

## 2024-04-02 NOTE — Progress Notes (Signed)
 Subjective:    Patient ID: [name] Glass, male    DOB: 06/20/1940, 83 y.o.   MRN: [id number]  HPI: [name] Glass is a 83 y.o. 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 who presents to clinic for treatment of pain related to chronic Right  sided headaches and cervicalgia  via injection as described below.     No new concerns or complaints. No major changes in medical history since last visit. His wife has a URI for 4 weeks and he has a linger cough, no fevers or chills. States his symptoms are controlled on flonase  and antibiotics from his PCP.    Physical Exam:  General: Appropriate appearance for age.  Mental Status: Appropriate mood and affect.  Cardiovascular: RRR, no m/r/g.  Respiratory: CTAB, no rales/rhonchi/wheezing. +Occassional dry cough Skin: No apparent rashes or lesions.  Neuro: Awake, alert, and oriented x3. No apparent deficits.  MSK: Moving all 4 limbs antigravity and against  resistance.    + TTP with palpable trigger points in R GON's , b/l cervical paraspinals,  R trapezius, R levator scapulae, and R infraspinatus.       PROCEDURE:  Right  greater occipital nerve block and bilateral trigger point injections 1. Myofascial neck pain   2. Cervico-occipital neuralgia of right side            Goals with treatment: [ x ] Decrease pain [ x ] Improve Active / Passive ROM [ x ] Improve ADLs [  ] Improve functional mobility   MEDICATION:  [ x ] Kenalog  40 mg/mL  [ X ] Lidocaine  1%      CONSENT: Obtained in writing per policy. Consent uploaded to chart.   Benefits discussed.  Risks discussed included, but were not limited to, pain and discomfort, bleeding, bruising, allergic reaction, infection. All questions answered to patient/family member/guardian/ caregiver satisfaction. They would like to proceed with procedure. There are no noted contraindications to procedure.   PROCEDURE Time out was preformed No heat sources No antibiotics   The patient was explained about both the benefits and risks of a  Right  greater occipital nerve block and bilateral trigger point injections as above. After the patient acknowledged an understanding of the risks and benefits, the patient agreed to proceed. The area was first marked and then prepped in an aseptic fashion with betadine / alcohol. A 30 g, 1/2 inch needle was directed via a direct approach into the bilateral cervical paraspinals,  R trapezius, R levator scapulae, and R infraspinatus.. The injection was completed with Kenalog  40 mg/ml 1 cc mixed with 2 cc of 1% lidocaine  after no blood was aspirated on pull back. Then, another syringe of the same mixture was directed 1/3 between the inion and the Right mastoid bone,  just medial to the occipital artery, and after no blood on drawback 3 cc was injected.   No complications were encountered. The patient tolerated the procedure well.     Impression: HPI: [name] Glass is  a 83 y.o. 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 who presents to clinic for treatment of R cervicalgia and headache pain . They received a  Right  greater occipital nerve block and bilateral trigger point injections as above.   PLAN: - Resume Usual Activities. Notify Physician of any unusual bleeding, erythema or concern for side effects as reviewed above. - Apply ice prn for pain - Tylenol  and tramadol  prn for pain - Follow up PRN Q1M for TPI and GONB

## 2024-04-08 ENCOUNTER — Other Ambulatory Visit: Payer: Self-pay | Admitting: Pulmonary Disease

## 2024-04-08 DIAGNOSIS — J449 Chronic obstructive pulmonary disease, unspecified: Secondary | ICD-10-CM

## 2024-04-10 ENCOUNTER — Other Ambulatory Visit: Payer: Self-pay | Admitting: Internal Medicine

## 2024-04-11 ENCOUNTER — Other Ambulatory Visit: Payer: Self-pay | Admitting: Pulmonary Disease

## 2024-04-11 ENCOUNTER — Other Ambulatory Visit: Payer: Self-pay | Admitting: Internal Medicine

## 2024-04-11 DIAGNOSIS — J449 Chronic obstructive pulmonary disease, unspecified: Secondary | ICD-10-CM

## 2024-04-11 DIAGNOSIS — E785 Hyperlipidemia, unspecified: Secondary | ICD-10-CM

## 2024-04-11 DIAGNOSIS — H6991 Unspecified Eustachian tube disorder, right ear: Secondary | ICD-10-CM

## 2024-04-11 DIAGNOSIS — J4540 Moderate persistent asthma, uncomplicated: Secondary | ICD-10-CM

## 2024-04-11 DIAGNOSIS — E291 Testicular hypofunction: Secondary | ICD-10-CM

## 2024-04-11 DIAGNOSIS — H6504 Acute serous otitis media, recurrent, right ear: Secondary | ICD-10-CM

## 2024-04-11 DIAGNOSIS — E039 Hypothyroidism, unspecified: Secondary | ICD-10-CM

## 2024-04-15 ENCOUNTER — Other Ambulatory Visit: Payer: Self-pay | Admitting: Pulmonary Disease

## 2024-04-15 DIAGNOSIS — J449 Chronic obstructive pulmonary disease, unspecified: Secondary | ICD-10-CM

## 2024-04-16 ENCOUNTER — Other Ambulatory Visit: Payer: Self-pay

## 2024-04-16 ENCOUNTER — Other Ambulatory Visit: Payer: Self-pay | Admitting: Internal Medicine

## 2024-04-16 DIAGNOSIS — N400 Enlarged prostate without lower urinary tract symptoms: Secondary | ICD-10-CM

## 2024-04-16 DIAGNOSIS — J449 Chronic obstructive pulmonary disease, unspecified: Secondary | ICD-10-CM

## 2024-04-16 MED ORDER — UMECLIDINIUM-VILANTEROL 62.5-25 MCG/ACT IN AEPB
1.0000 | INHALATION_SPRAY | Freq: Every day | RESPIRATORY_TRACT | 0 refills | Status: DC
Start: 2024-04-16 — End: 2024-04-16

## 2024-04-16 MED ORDER — UMECLIDINIUM-VILANTEROL 62.5-25 MCG/ACT IN AEPB: 1.0000 | INHALATION_SPRAY | Freq: Every day | RESPIRATORY_TRACT | 1 refills | Status: DC

## 2024-04-16 MED ORDER — FINASTERIDE 5 MG PO TABS: 5.0000 mg | ORAL_TABLET | Freq: Every day | ORAL | 1 refills | Status: AC

## 2024-04-16 NOTE — Telephone Encounter (Signed)
 Copied from CRM #[id number]. Topic: Clinical - Medication Refill >> Apr 16, 2024 10:20 AM Zebedee SAUNDERS wrote: Medication: finasteride (PROSCAR) 5 MG tablet  Has the patient contacted their pharmacy? Yes (Agent: If no, request that the patient contact the pharmacy for the refill. If patient does not wish to contact the pharmacy document the reason why and proceed with request.) (Agent: If yes, when and what did the pharmacy advise?)  This is the patient's preferred pharmacy:  [id number] [place], [place] - [address] [place] [address] Phone: [phone] Fax: [phone]  Is this the correct pharmacy for this prescription? Yes If no, delete pharmacy and type the correct one.   Has the prescription been filled recently? Yes  Is the patient out of the medication? Yes  Has the patient been seen for an appointment in the last year OR does the patient have an upcoming appointment? Yes  Can we respond through MyChart? Yes  Agent: Please be advised that Rx refills may take up to 3 business days. We ask that you follow-up with your pharmacy.

## 2024-04-21 ENCOUNTER — Other Ambulatory Visit: Payer: Self-pay | Admitting: Internal Medicine

## 2024-04-21 DIAGNOSIS — J301 Allergic rhinitis due to pollen: Secondary | ICD-10-CM

## 2024-04-21 DIAGNOSIS — J4540 Moderate persistent asthma, uncomplicated: Secondary | ICD-10-CM

## 2024-04-21 DIAGNOSIS — H6991 Unspecified Eustachian tube disorder, right ear: Secondary | ICD-10-CM

## 2024-04-21 DIAGNOSIS — B002 Herpesviral gingivostomatitis and pharyngotonsillitis: Secondary | ICD-10-CM

## 2024-04-21 DIAGNOSIS — E785 Hyperlipidemia, unspecified: Secondary | ICD-10-CM

## 2024-04-21 DIAGNOSIS — E291 Testicular hypofunction: Secondary | ICD-10-CM

## 2024-04-21 DIAGNOSIS — H6504 Acute serous otitis media, recurrent, right ear: Secondary | ICD-10-CM

## 2024-04-21 MED ORDER — ANASTROZOLE 1 MG PO TABS: 1.0000 mg | ORAL_TABLET | ORAL | 0 refills | Status: AC

## 2024-04-21 MED ORDER — PITAVASTATIN CALCIUM 4 MG PO TABS
1.0000 | ORAL_TABLET | Freq: Every day | ORAL | 0 refills | Status: AC
Start: 2024-04-21 — End: ?

## 2024-04-21 MED ORDER — FLUTICASONE PROPIONATE 50 MCG/ACT NA SUSP: 2.0000 | Freq: Every day | NASAL | 1 refills | Status: AC

## 2024-04-21 MED ORDER — MONTELUKAST SODIUM 10 MG PO TABS: 10.0000 mg | ORAL_TABLET | Freq: Every day | ORAL | 1 refills | Status: AC

## 2024-04-21 MED ORDER — LEVOCETIRIZINE DIHYDROCHLORIDE 5 MG PO TABS: 5.0000 mg | ORAL_TABLET | Freq: Every evening | ORAL | 1 refills | Status: AC

## 2024-04-21 MED ORDER — ACYCLOVIR 200 MG PO CAPS: ORAL_CAPSULE | ORAL | 0 refills | Status: AC

## 2024-04-23 ENCOUNTER — Ambulatory Visit: Payer: Medicare Other

## 2024-04-23 DIAGNOSIS — I495 Sick sinus syndrome: Secondary | ICD-10-CM | POA: Diagnosis not present

## 2024-04-24 LAB — CUP PACEART REMOTE DEVICE CHECK
Battery Remaining Longevity: 112 mo
Battery Voltage: 3 V
Brady Statistic AP VP Percent: 13.18 %
Brady Statistic AP VS Percent: 49 %
Brady Statistic AS VP Percent: 3.89 %
Brady Statistic AS VS Percent: 33.93 %
Brady Statistic RA Percent Paced: 62.26 %
Brady Statistic RV Percent Paced: 17.08 %
Date Time Interrogation Session: 20251118231925
Implantable Lead Connection Status: 753985
Implantable Lead Connection Status: 753985
Implantable Lead Implant Date: 20200527
Implantable Lead Implant Date: 20200527
Implantable Lead Location: 753859
Implantable Lead Location: 753860
Implantable Lead Model: 5076
Implantable Lead Model: 5076
Implantable Pulse Generator Implant Date: 20200527
Lead Channel Impedance Value: 361 Ohm
Lead Channel Impedance Value: 456 Ohm
Lead Channel Impedance Value: 475 Ohm
Lead Channel Impedance Value: 646 Ohm
Lead Channel Pacing Threshold Amplitude: 0.5 V
Lead Channel Pacing Threshold Amplitude: 0.75 V
Lead Channel Pacing Threshold Pulse Width: 0.4 ms
Lead Channel Pacing Threshold Pulse Width: 0.4 ms
Lead Channel Sensing Intrinsic Amplitude: 2.875 mV
Lead Channel Sensing Intrinsic Amplitude: 2.875 mV
Lead Channel Sensing Intrinsic Amplitude: 8.625 mV
Lead Channel Sensing Intrinsic Amplitude: 8.625 mV
Lead Channel Setting Pacing Amplitude: 1.5 V
Lead Channel Setting Pacing Amplitude: 2.5 V
Lead Channel Setting Pacing Pulse Width: 0.4 ms
Lead Channel Setting Sensing Sensitivity: 2 mV
Zone Setting Status: 755011
Zone Setting Status: 755011

## 2024-04-25 NOTE — Progress Notes (Signed)
 Remote PPM Transmission

## 2024-05-05 ENCOUNTER — Ambulatory Visit: Payer: Self-pay | Admitting: Cardiovascular Disease

## 2024-05-19 ENCOUNTER — Encounter: Admitting: Physical Medicine and Rehabilitation

## 2024-06-11 ENCOUNTER — Other Ambulatory Visit (HOSPITAL_BASED_OUTPATIENT_CLINIC_OR_DEPARTMENT_OTHER)

## 2024-06-11 ENCOUNTER — Ambulatory Visit: Payer: Self-pay | Admitting: Cardiovascular Disease

## 2024-06-11 ENCOUNTER — Encounter: Payer: Self-pay | Admitting: Cardiovascular Disease

## 2024-06-11 DIAGNOSIS — I350 Nonrheumatic aortic (valve) stenosis: Secondary | ICD-10-CM

## 2024-06-11 DIAGNOSIS — I2510 Atherosclerotic heart disease of native coronary artery without angina pectoris: Secondary | ICD-10-CM

## 2024-06-11 DIAGNOSIS — I7121 Aneurysm of the ascending aorta, without rupture: Secondary | ICD-10-CM | POA: Diagnosis not present

## 2024-06-11 DIAGNOSIS — E785 Hyperlipidemia, unspecified: Secondary | ICD-10-CM

## 2024-06-11 DIAGNOSIS — I10 Essential (primary) hypertension: Secondary | ICD-10-CM

## 2024-06-11 LAB — ECHOCARDIOGRAM COMPLETE
AR max vel: 1.69 cm2
AV Area VTI: 1.72 cm2
AV Area mean vel: 1.58 cm2
AV Mean grad: 15 mmHg
AV Peak grad: 27.2 mmHg
Ao pk vel: 2.61 m/s
Area-P 1/2: 2.54 cm2
S' Lateral: 2.97 cm

## 2024-06-12 ENCOUNTER — Ambulatory Visit (INDEPENDENT_AMBULATORY_CARE_PROVIDER_SITE_OTHER)

## 2024-06-12 ENCOUNTER — Ambulatory Visit: Admitting: Family Medicine

## 2024-06-12 ENCOUNTER — Ambulatory Visit: Payer: Self-pay | Admitting: Family Medicine

## 2024-06-12 ENCOUNTER — Encounter: Payer: Self-pay | Admitting: Family Medicine

## 2024-06-12 VITALS — BP 112/70 | HR 68 | Temp 97.7°F | Ht 72.0 in | Wt 198.2 lb

## 2024-06-12 DIAGNOSIS — R051 Acute cough: Secondary | ICD-10-CM

## 2024-06-12 DIAGNOSIS — U071 COVID-19: Secondary | ICD-10-CM

## 2024-06-12 DIAGNOSIS — J329 Chronic sinusitis, unspecified: Secondary | ICD-10-CM | POA: Diagnosis not present

## 2024-06-12 DIAGNOSIS — R0981 Nasal congestion: Secondary | ICD-10-CM

## 2024-06-12 DIAGNOSIS — B9689 Other specified bacterial agents as the cause of diseases classified elsewhere: Secondary | ICD-10-CM | POA: Diagnosis not present

## 2024-06-12 LAB — POCT INFLUENZA A/B
Influenza A, POC: NEGATIVE
Influenza B, POC: NEGATIVE

## 2024-06-12 LAB — POC COVID19 BINAXNOW: SARS Coronavirus 2 Ag: POSITIVE — AB

## 2024-06-12 MED ORDER — DOXYCYCLINE HYCLATE 100 MG PO CAPS: 100.0000 mg | ORAL_CAPSULE | Freq: Two times a day (BID) | ORAL | 0 refills | Status: AC

## 2024-06-12 MED ORDER — HYDROCODONE BIT-HOMATROP MBR 5-1.5 MG/5ML PO SOLN: 5.0000 mL | Freq: Three times a day (TID) | ORAL | 0 refills | Status: AC | PRN

## 2024-06-12 NOTE — Progress Notes (Signed)
 "  Acute Office Visit  Subjective:     Patient ID: [name] Glass, male    DOB: 1941-01-07, 84 y.o.   MRN: [id number]  No chief complaint on file.   HPI  Discussed the use of AI scribe software for clinical note transcription with the patient, who gave verbal consent to proceed.  History of Present Illness [name] Glass is an 84 year old male who presents with symptoms of COVID-19 and a possible secondary bacterial sinus infection.  Upper respiratory symptoms - Marked sinus congestion with thick yellow to brown mucus and heavy drainage - Uses neti pot with some improvement in sleep  Cough and sputum production - Significant productive cough with copious sputum - Uses DayQuil and NyQuil for symptom relief - Codeine has been effective for cough suppression in the past  Constitutional symptoms - Drinking fluids - Poor appetite  Medication allergies - Allergic to sulfa drugs, including Bactrim - Sulfa drugs cause severe disturbing dreams with violent and paranoid content     ROS Per HPI      Objective:    BP 112/70 (BP Location: Left Arm, Patient Position: Sitting)   Pulse 68   Temp 97.7 F (36.5 C) (Temporal)   Ht 6' (1.829 m)   Wt 198 lb 3.2 oz (89.9 kg)   SpO2 98%   BMI 26.88 kg/m    Physical Exam Vitals and nursing note reviewed.  Constitutional:      General: He is not in acute distress.    Appearance: He is ill-appearing.  HENT:     Head: Normocephalic and atraumatic.     Right Ear: External ear normal.     Left Ear: External ear normal.     Nose:     Right Sinus: Frontal sinus tenderness present.     Left Sinus: Frontal sinus tenderness present.     Mouth/Throat:     Mouth: Mucous membranes are moist.     Comments: Oropharyngeal cobblestoning   Eyes:     Extraocular Movements: Extraocular movements intact.  Cardiovascular:     Rate and Rhythm: Normal rate and regular rhythm.     Pulses: Normal pulses.     Heart sounds: Murmur heard.   Pulmonary:     Effort: Pulmonary effort is normal. No respiratory distress.     Breath sounds: Normal breath sounds. No wheezing, rhonchi or rales.     Comments: Productive cough in office Musculoskeletal:     Cervical back: Normal range of motion.     Right lower leg: No edema.     Left lower leg: No edema.  Lymphadenopathy:     Cervical: Cervical adenopathy present.  Skin:    General: Skin is warm and dry.  Neurological:     General: No focal deficit present.     Mental Status: He is alert and oriented to person, place, and time.  Psychiatric:        Mood and Affect: Mood normal.        Behavior: Behavior normal.     Results for orders placed or performed in visit on 06/12/24  POC COVID-19 BinaxNow  Result Value Ref Range   SARS Coronavirus 2 Ag Positive (A) Negative  POCT Influenza A/B  Result Value Ref Range   Influenza A, POC Negative Negative   Influenza B, POC Negative Negative        Assessment & Plan:   Assessment and Plan Assessment & Plan COVID-19 Positive test with fatigue, cough, and sinus  congestion. No wheezing or pneumonia. Antiviral treatment not initiated due to timing. - Ordered chest x-ray to rule out pneumonia. - Prescribed doxycycline  for secondary bacterial infection. - Prescribed codeine-containing cough medicine for symptomatic relief.  Acute bacterial sinusitis, nasal congestion Suspected due to thick yellow-brown nasal discharge and sinus congestion. Allergic to sulfa drugs and experiences vivid dreams with Bactrim. - Prescribed doxycycline  for sinusitis. - likely secondary bacterial infection given recent COVID symptoms - Ordered chest x-ray to rule out pneumonia.  Acute cough Persistent cough with productive sputum. Codeine-containing cough medicine effective. - Prescribed codeine-containing cough medicine.     Orders Placed This Encounter  Procedures   DG Chest 2 View    Standing Status:   Future    Number of Occurrences:   1     Expiration Date:   12/10/2024    Reason for Exam (SYMPTOM  OR DIAGNOSIS REQUIRED):   cough, COVID 19, SOB    Preferred imaging location?:   [facility]   POC COVID-19 BinaxNow   POCT Influenza A/B     Meds ordered this encounter  Medications   doxycycline  (VIBRAMYCIN ) 100 MG capsule    Sig: Take 1 capsule (100 mg total) by mouth 2 (two) times daily for 7 days.    Dispense:  14 capsule    Refill:  0   HYDROcodone  bit-homatropine (HYCODAN) 5-1.5 MG/5ML syrup    Sig: Take 5 mLs by mouth every 8 (eight) hours as needed for cough.    Dispense:  120 mL    Refill:  0    Return if symptoms worsen or fail to improve.  Corean LITTIE Ku, FNP  "

## 2024-06-12 NOTE — Progress Notes (Signed)
 Discussed in OV, outside of antiviral treatment window

## 2024-06-12 NOTE — Patient Instructions (Signed)
 I have sent in a prescription for doxycycline  for you to take twice a day for 7 days.  Take this medication with food as it can upset your stomach if you do not.  I have sent in hydrocodone  cough syrup for you to take 5 mL once daily in the evening as needed for cough.  This medication may make you sleepy.  Do not drive or operate heavy machinery while taking this medication.  COVID test was positive today, but we are outside the window for antivirals.   We are getting an xray today. We will be in contact with any abnormal results that require further attention.  Follow-up with me for new or worsening symptoms.

## 2024-06-16 ENCOUNTER — Encounter: Payer: Self-pay | Admitting: Pulmonary Disease

## 2024-06-16 ENCOUNTER — Ambulatory Visit: Admitting: Pulmonary Disease

## 2024-06-16 VITALS — BP 123/74 | HR 57 | Ht 72.0 in | Wt 203.4 lb

## 2024-06-16 DIAGNOSIS — J449 Chronic obstructive pulmonary disease, unspecified: Secondary | ICD-10-CM

## 2024-06-16 MED ORDER — TRELEGY ELLIPTA 100-62.5-25 MCG/ACT IN AEPB: 1.0000 | INHALATION_SPRAY | Freq: Every day | RESPIRATORY_TRACT | 11 refills | Status: AC

## 2024-06-16 MED ORDER — ALBUTEROL SULFATE HFA 108 (90 BASE) MCG/ACT IN AERS: 2.0000 | INHALATION_SPRAY | Freq: Four times a day (QID) | RESPIRATORY_TRACT | 6 refills | Status: AC | PRN

## 2024-06-16 NOTE — Patient Instructions (Signed)
 Stop anoro ellipta  inhaler  Start trelegy ellipta  inhaler 1 puff daily - rinse mouth out after each use  Use albuterol  inhaler 1-2 puffs every 4-6 hours as needed for shortness of breath  Schedule pulmonary function tests at the front desk   We will schedule you for CT Chest in 1-2 weeks  Follow up in 6 weeks to review CT chest and breathing tests

## 2024-06-16 NOTE — Progress Notes (Unsigned)
 "  Established Patient Pulmonology Office Visit   Subjective:  Patient ID: [name] Glass, male    DOB: [birth year]  MRN: [id number]  CC:  Chief Complaint  Patient presents with   Medical Management of Chronic Issues    ODF - Pt states  more SOB     Discussed the use of AI scribe software for clinical note transcription with the patient, who gave verbal consent to proceed.  History of Present Illness [name] Glass is an 84 year old male with coronary artery disease status post stent in 2020, sick sinus syndrome s/p pacemaker, hypertension, allergic rhinitis, GERD and obstructive sleep apnea not on CPAP who returns to pulmonary clinic for COPD follow up   He reports worsening exertional shortness of breath with stair climbing and walking, including needing to stop and rest on the stairs at home and becoming lightheaded at the top. He is also short of breath walking longer distances and on inclines in his neighborhood.  He recently had COVID-19 for about eight to nine days and was treated with doxycycline  for a concurrent sinus infection, which has resolved.  He uses Anoro every morning with careful technique and does not use an albuterol  inhaler.  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 He has never smoked or used drugs and usually maintains an active lifestyle, now limited by his breathing.        ROS   Current Medications[1]      Objective:  BP 123/74   Pulse (!) 57   Ht 6' (1.829 m) Comment: per pt  Wt 203 lb 6.4 oz (92.3 kg)   SpO2 97%   BMI 27.59 kg/m     Physical Exam Constitutional:      General: He is not in acute distress.    Appearance: Normal appearance.  Eyes:     General: No scleral icterus.    Conjunctiva/sclera: Conjunctivae normal.  Cardiovascular:     Rate and Rhythm: Normal rate and regular rhythm.  Pulmonary:     Breath sounds: Rales (left base) present. No wheezing or  rhonchi.  Musculoskeletal:     Right lower leg: No edema.     Left lower leg: No edema.  Skin:    General: Skin is warm and dry.  Neurological:     General: No focal deficit present.      Diagnostic Review:  Last CBC Lab Results  Component Value Date   WBC 8.4 01/07/2024   HGB 14.9 01/07/2024   HCT 45.2 01/07/2024   MCV 104.6 (H) 01/07/2024   MCH 32.8 10/15/2021   RDW 14.3 01/07/2024   PLT 259.0 01/07/2024   Last metabolic panel Lab Results  Component Value Date   GLUCOSE 104 (H) 01/07/2024   NA 141 01/07/2024   K 4.1 01/07/2024   CL 106 01/07/2024   CO2 25 01/07/2024   BUN 26 (H) 01/07/2024   CREATININE 1.33 01/07/2024   GFR 49.43 (L) 01/07/2024   CALCIUM  10.0 01/07/2024   PHOS 4.0 10/31/2018   PROT 7.4 01/07/2024   ALBUMIN 4.3 01/07/2024   BILITOT 0.5 01/07/2024   ALKPHOS 51 01/07/2024   AST 14 01/07/2024   ALT 11 01/07/2024   ANIONGAP 7 10/15/2021   CT Chest 10/15/21 1. Peribronchovascular opacities at the lung bases, mostly in the lower lobes, likely atelectasis. Consider infection if there are consistent clinical findings. 2. No other evidence of acute cardiopulmonary disease. 3. Coronary artery  calcifications and aortic atherosclerosis. 4. Ascending aorta measures 4 cm in diameter, without change from the prior CT. Recommend annual imaging followup by CTA or MRA. This recommendation follows 2010 ACCF/AHA/AATS/ACR/ASA/SCA/SCAI/SIR/STS/SVM Guidelines for the Diagnosis and Management of Patients with Thoracic Aortic Disease. Circulation. 2010; 121: Z733-z630. Aortic aneurysm NOS (ICD10-I71.9)     Assessment & Plan:   Assessment & Plan Chronic obstructive pulmonary disease, unspecified COPD type (HCC)  Orders:   Fluticasone -Umeclidin-Vilant (TRELEGY ELLIPTA ) 100-62.5-25 MCG/ACT AEPB; Inhale 1 puff into the lungs daily.   AMB referral to pulmonary rehabilitation   Pulmonary Function Test; Future   CT CHEST HIGH RESOLUTION; Future   albuterol   (VENTOLIN  HFA) 108 (90 Base) MCG/ACT inhaler; Inhale 2 puffs into the lungs every 6 (six) hours as needed for wheezing or shortness of breath.   Assessment and Plan Assessment & Plan Chronic obstructive pulmonary disease Increased exertional dyspnea. No smoking history. Possible chemical exposure. Chest x-ray shows chronic interstitial markings. - Prescribed Trelegy inhaler, one puff daily, instructed to rinse mouth after use. - Prescribed albuterol  inhaler for use as needed, especially before exertion. - Scheduled pulmonary rehabilitation program. - Ordered repeat pulmonary function tests and chest CT scan. - Instructed to stop Anoro once Trelegy is started.  Deconditioning related to chronic lung disease Deconditioning contributing to exertional dyspnea. - Scheduled pulmonary rehabilitation program to improve endurance and activity level.      Return in about 6 weeks (around 07/28/2024) for f/u visit Dr. Kara.   Dorn KATHEE Kara, MD     [1]  Current Outpatient Medications:    acyclovir  (ZOVIRAX ) 200 MG capsule, TAKE 1 CAPSULE BY MOUTH THREE TIMES A DAY, Disp: 270 capsule, Rfl: 0   albuterol  (VENTOLIN  HFA) 108 (90 Base) MCG/ACT inhaler, Inhale 2 puffs into the lungs every 6 (six) hours as needed for wheezing or shortness of breath., Disp: 8 g, Rfl: 6   anastrozole  (ARIMIDEX ) 1 MG tablet, Take 1 tablet (1 mg total) by mouth every 3 (three) days., Disp: 24 tablet, Rfl: 0   Cholecalciferol (VITAMIN D-3) 125 MCG (5000 UT) TABS, Take 125 mcg by mouth daily., Disp: , Rfl:    ezetimibe  (ZETIA ) 10 MG tablet, TAKE 1 TABLET BY MOUTH EVERY DAY, Disp: 90 tablet, Rfl: 1   finasteride  (PROSCAR ) 5 MG tablet, Take 1 tablet (5 mg total) by mouth daily., Disp: 90 tablet, Rfl: 1   fluticasone  (FLONASE ) 50 MCG/ACT nasal spray, Place 2 sprays into both nostrils daily., Disp: 48 mL, Rfl: 1   Fluticasone -Umeclidin-Vilant (TRELEGY ELLIPTA ) 100-62.5-25 MCG/ACT AEPB, Inhale 1 puff into the lungs daily.,  Disp: 30 each, Rfl: 11   ipratropium (ATROVENT ) 0.03 % nasal spray, Place 2 sprays into both nostrils 3 (three) times daily as needed for rhinitis., Disp: 30 mL, Rfl: 12   levocetirizine (XYZAL ) 5 MG tablet, Take 1 tablet (5 mg total) by mouth every evening., Disp: 90 tablet, Rfl: 1   levothyroxine  (SYNTHROID ) 75 MCG tablet, Take 1 tablet (75 mcg total) by mouth daily., Disp: 90 tablet, Rfl: 1   meloxicam  (MOBIC ) 15 MG tablet, Take 1 tablet (15 mg total) by mouth daily., Disp: 90 tablet, Rfl: 2   montelukast  (SINGULAIR ) 10 MG tablet, Take 1 tablet (10 mg total) by mouth at bedtime., Disp: 90 tablet, Rfl: 1   omeprazole  (PRILOSEC) 20 MG capsule, Take by mouth., Disp: , Rfl:    Pitavastatin  Calcium  4 MG TABS, Take 1 tablet (4 mg total) by mouth daily., Disp: 90 tablet, Rfl: 0   potassium gluconate 595 (  99 K) MG TABS tablet, Take by mouth., Disp: , Rfl:    Simethicone  40 MG/0.6ML LIQD, Take 0.6 mLs (40 mg total) by mouth 4 (four) times daily., Disp: 300 mL, Rfl: 0   Somatropin  (OMNITROPE ) 5 MG/1.5ML SOCT, Inject into the skin., Disp: , Rfl:    tamsulosin  (FLOMAX ) 0.4 MG CAPS capsule, TAKE 1 CAPSULE BY MOUTH EVERY DAY IN THE MORNING, Disp: 90 capsule, Rfl: 1   testosterone  cypionate (DEPOTESTOSTERONE CYPIONATE) 200 MG/ML injection, Inject 0.5 mLs (100 mg total) into the muscle once a week., Disp: 10 mL, Rfl: 0   doxycycline  (VIBRAMYCIN ) 100 MG capsule, Take 1 capsule (100 mg total) by mouth 2 (two) times daily for 7 days., Disp: 14 capsule, Rfl: 0   HYDROcodone  bit-homatropine (HYCODAN) 5-1.5 MG/5ML syrup, Take 5 mLs by mouth every 8 (eight) hours as needed for cough., Disp: 120 mL, Rfl: 0   Probiotic Product (PRODIGEN) CAPS, Take 1 capsule by mouth daily., Disp: , Rfl:   "

## 2024-06-18 ENCOUNTER — Encounter: Payer: Self-pay | Admitting: Pulmonary Disease

## 2024-06-19 ENCOUNTER — Ambulatory Visit (HOSPITAL_BASED_OUTPATIENT_CLINIC_OR_DEPARTMENT_OTHER)

## 2024-06-19 DIAGNOSIS — J449 Chronic obstructive pulmonary disease, unspecified: Secondary | ICD-10-CM | POA: Diagnosis not present

## 2024-06-19 LAB — PULMONARY FUNCTION TEST
DL/VA % pred: 96 %
DL/VA: 3.7 ml/min/mmHg/L
DLCO unc % pred: 76 %
DLCO unc: 19.61 ml/min/mmHg
FEF 25-75 Post: 1.65 L/s
FEF 25-75 Pre: 1.71 L/s
FEF2575-%Change-Post: -3 %
FEF2575-%Pred-Post: 81 %
FEF2575-%Pred-Pre: 85 %
FEV1-%Change-Post: -1 %
FEV1-%Pred-Post: 76 %
FEV1-%Pred-Pre: 77 %
FEV1-Post: 2.3 L
FEV1-Pre: 2.33 L
FEV1FVC-%Change-Post: -1 %
FEV1FVC-%Pred-Pre: 102 %
FEV6-%Change-Post: 0 %
FEV6-%Pred-Post: 80 %
FEV6-%Pred-Pre: 80 %
FEV6-Post: 3.17 L
FEV6-Pre: 3.2 L
FEV6FVC-%Change-Post: 0 %
FEV6FVC-%Pred-Post: 106 %
FEV6FVC-%Pred-Pre: 106 %
FVC-%Change-Post: 0 %
FVC-%Pred-Post: 76 %
FVC-%Pred-Pre: 75 %
FVC-Post: 3.23 L
FVC-Pre: 3.21 L
Post FEV1/FVC ratio: 71 %
Post FEV6/FVC ratio: 100 %
Pre FEV1/FVC ratio: 73 %
Pre FEV6/FVC Ratio: 100 %
RV % pred: 132 %
RV: 3.74 L
TLC % pred: 91 %
TLC: 6.86 L

## 2024-06-19 NOTE — Patient Instructions (Signed)
 Full PFT performed today.

## 2024-06-19 NOTE — Progress Notes (Signed)
 Full PFT performed today.

## 2024-06-20 ENCOUNTER — Telehealth (HOSPITAL_COMMUNITY): Payer: Self-pay

## 2024-06-20 NOTE — Telephone Encounter (Signed)
 Called patient to schedule pulmonary rehab, patient would like to wait until a spot in the 1:15 class opens in February. Informed him the February schedule will most likely open next week and we will call him then.

## 2024-06-25 ENCOUNTER — Encounter: Attending: Physical Medicine and Rehabilitation | Admitting: Physical Medicine and Rehabilitation

## 2024-06-25 ENCOUNTER — Encounter: Payer: Self-pay | Admitting: Physical Medicine and Rehabilitation

## 2024-06-25 VITALS — BP 107/61 | HR 60 | Ht 72.0 in | Wt 202.0 lb

## 2024-06-25 DIAGNOSIS — I2510 Atherosclerotic heart disease of native coronary artery without angina pectoris: Secondary | ICD-10-CM | POA: Insufficient documentation

## 2024-06-25 DIAGNOSIS — I495 Sick sinus syndrome: Secondary | ICD-10-CM | POA: Insufficient documentation

## 2024-06-25 DIAGNOSIS — M7918 Myalgia, other site: Secondary | ICD-10-CM | POA: Diagnosis not present

## 2024-06-25 DIAGNOSIS — I129 Hypertensive chronic kidney disease with stage 1 through stage 4 chronic kidney disease, or unspecified chronic kidney disease: Secondary | ICD-10-CM | POA: Insufficient documentation

## 2024-06-25 DIAGNOSIS — N183 Chronic kidney disease, stage 3 unspecified: Secondary | ICD-10-CM | POA: Diagnosis not present

## 2024-06-25 DIAGNOSIS — G894 Chronic pain syndrome: Secondary | ICD-10-CM | POA: Diagnosis not present

## 2024-06-25 DIAGNOSIS — M542 Cervicalgia: Secondary | ICD-10-CM | POA: Diagnosis not present

## 2024-06-25 DIAGNOSIS — R7303 Prediabetes: Secondary | ICD-10-CM | POA: Insufficient documentation

## 2024-06-25 DIAGNOSIS — M5481 Occipital neuralgia: Secondary | ICD-10-CM | POA: Diagnosis not present

## 2024-06-25 DIAGNOSIS — E785 Hyperlipidemia, unspecified: Secondary | ICD-10-CM | POA: Insufficient documentation

## 2024-06-25 DIAGNOSIS — N400 Enlarged prostate without lower urinary tract symptoms: Secondary | ICD-10-CM | POA: Diagnosis not present

## 2024-06-25 DIAGNOSIS — E039 Hypothyroidism, unspecified: Secondary | ICD-10-CM | POA: Insufficient documentation

## 2024-06-25 DIAGNOSIS — Z950 Presence of cardiac pacemaker: Secondary | ICD-10-CM | POA: Insufficient documentation

## 2024-06-25 DIAGNOSIS — K219 Gastro-esophageal reflux disease without esophagitis: Secondary | ICD-10-CM | POA: Insufficient documentation

## 2024-06-25 DIAGNOSIS — G4730 Sleep apnea, unspecified: Secondary | ICD-10-CM | POA: Insufficient documentation

## 2024-06-25 MED ORDER — LIDOCAINE HCL 1 % IJ SOLN: 6.0000 mL | Freq: Once | INTRAMUSCULAR | Status: AC

## 2024-06-25 MED ORDER — TRIAMCINOLONE ACETONIDE 40 MG/ML IJ SUSP: 5.0000 mg | Freq: Once | INTRAMUSCULAR | Status: AC

## 2024-06-25 NOTE — Progress Notes (Signed)
 HPI: [name] Glass is a 84 y.o. 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 who presents to clinic for treatment of pain related to chronic Right  sided headaches and cervicalgia  via injection as described below.     No new concerns or complaints. No major changes in medical history since last visit. His wife has a URI for 4 weeks and he has a linger cough, no fevers or chills. States his symptoms are controlled on flonase  and antibiotics from his PCP.    Physical Exam:  General: Appropriate appearance for age.  Mental Status: Appropriate mood and affect.  Cardiovascular: RRR, no m/r/g.  Respiratory: CTAB, no rales/rhonchi/wheezing. +Occassional dry cough Skin: No apparent rashes or lesions.  Neuro: Awake, alert, and oriented x3. No apparent deficits.  MSK: Moving all 4 limbs antigravity and against resistance.    + TTP with palpable trigger points in R GON's , b/l cervical paraspinals,  R  trapezius, R levator scapulae, and R infraspinatus.       PROCEDURE:  Right  greater occipital nerve block and bilateral trigger point injections 1. Myofascial neck pain   2. Cervico-occipital neuralgia of right side            Goals with treatment: [ x ] Decrease pain [ x ] Improve Active / Passive ROM [ x ] Improve ADLs [  ] Improve functional mobility   MEDICATION:  [ x ] Kenalog  40 mg/mL  [ X ] Lidocaine  1%      CONSENT: Obtained in writing per policy. Consent uploaded to chart.   Benefits discussed.  Risks discussed included, but were not limited to, pain and discomfort, bleeding, bruising, allergic reaction, infection. All questions answered to patient/family member/guardian/ caregiver satisfaction. They would like to proceed with procedure. There are no noted contraindications to procedure.   PROCEDURE Time out was preformed No heat sources No antibiotics   The patient was explained about both the benefits and risks of a  Right  greater occipital nerve block and bilateral trigger point injections as above. After the patient acknowledged an understanding of the risks and benefits, the patient agreed to proceed. The area was first marked and then prepped in an aseptic fashion with betadine / alcohol. A 30 g, 1/2 inch needle was directed via a direct approach into the bilateral cervical paraspinals,  R trapezius, R levator scapulae, and R infraspinatus.. The injection was completed with Kenalog  40 mg/ml 1 cc mixed with 2 cc of 1% lidocaine  after no blood was aspirated on pull back. Then, another syringe of the same mixture was directed 1/3 between the inion and the Right mastoid bone, just medial to the occipital artery, and after no blood on drawback 3 cc was injected.   No complications were encountered. The  patient tolerated the procedure well.     Impression: HPI: [name] Glass is a 84 y.o. male with PMHx has Coronary artery disease; OA (osteoarthritis) of knee;  Gastroesophageal reflux disease without esophagitis; Essential hypertension, benign; Hyperlipidemia with target LDL less than 70; Chronic renal disease, stage 3, moderately decreased glomerular filtration rate (GFR) between 30-59 mL/min/1.73 square meter (HCC); Seasonal allergic rhinitis due to pollen; Benign prostatic hyperplasia without lower urinary tract symptoms; Presence of permanent cardiac pacemaker; Sleep apnea; Hypothyroidism; Myofascial neck pain; Degenerative disc disease, cervical; Hypogonadism male; Moderate persistent asthma without complication; Chronic bronchitis, mucopurulent (HCC); Sinus node dysfunction (HCC); Thoracic aortic aneurysm (HCC); Moderate aortic stenosis; Chronic tension-type headache, not intractable; Postural kyphosis of cervicothoracic region; Cervico-occipital neuralgia of right side; Prediabetes; Chronic pain syndrome; Myalgia, shoulder region; Acute cough; Abnormal EKG; Idiopathic hypotension; and Muscle spasms of neck on their problem list. who presents to clinic for treatment of R cervicalgia and headache pain . They received a  Right  greater occipital nerve block and bilateral trigger point injections as above.   PLAN: - Resume Usual Activities. Notify Physician of any unusual bleeding, erythema or concern for side effects as reviewed above. - Apply ice prn for pain - Tylenol  and tramadol  prn for pain - Follow up PRN Q1M for TPI and GONB   Joesph JAYSON Likes, DO 06/25/2024

## 2024-06-25 NOTE — Patient Instructions (Signed)
-   Resume Usual Activities. Notify Physician of any unusual bleeding, erythema or concern for side effects as reviewed above. - Apply ice prn for pain - Tylenol  and tramadol  prn for pain - Follow up PRN Q1M for TPI and GONB

## 2024-06-26 ENCOUNTER — Ambulatory Visit (HOSPITAL_BASED_OUTPATIENT_CLINIC_OR_DEPARTMENT_OTHER)
Admission: RE | Admit: 2024-06-26 | Discharge: 2024-06-26 | Disposition: A | Discharge status: Home / Self Care | Source: Ambulatory Visit | Attending: Pulmonary Disease | Admitting: Pulmonary Disease

## 2024-06-26 DIAGNOSIS — J449 Chronic obstructive pulmonary disease, unspecified: Secondary | ICD-10-CM | POA: Diagnosis present

## 2024-07-01 ENCOUNTER — Ambulatory Visit: Payer: Self-pay | Admitting: Pulmonary Disease

## 2024-07-01 ENCOUNTER — Telehealth: Payer: Self-pay | Admitting: Pulmonary Disease

## 2024-07-01 NOTE — Telephone Encounter (Signed)
 Created in error

## 2024-07-02 ENCOUNTER — Ambulatory Visit (INDEPENDENT_AMBULATORY_CARE_PROVIDER_SITE_OTHER): Payer: Medicare Other

## 2024-07-02 ENCOUNTER — Ambulatory Visit: Attending: Internal Medicine | Admitting: Cardiovascular Disease

## 2024-07-02 ENCOUNTER — Encounter: Payer: Self-pay | Admitting: Cardiovascular Disease

## 2024-07-02 VITALS — Ht 72.0 in | Wt 195.0 lb

## 2024-07-02 VITALS — BP 114/72 | HR 65 | Ht 72.0 in | Wt 202.0 lb

## 2024-07-02 DIAGNOSIS — E785 Hyperlipidemia, unspecified: Secondary | ICD-10-CM | POA: Diagnosis not present

## 2024-07-02 DIAGNOSIS — M5030 Other cervical disc degeneration, unspecified cervical region: Secondary | ICD-10-CM | POA: Diagnosis not present

## 2024-07-02 DIAGNOSIS — I2510 Atherosclerotic heart disease of native coronary artery without angina pectoris: Secondary | ICD-10-CM | POA: Diagnosis not present

## 2024-07-02 DIAGNOSIS — I495 Sick sinus syndrome: Secondary | ICD-10-CM | POA: Insufficient documentation

## 2024-07-02 DIAGNOSIS — Z950 Presence of cardiac pacemaker: Secondary | ICD-10-CM | POA: Diagnosis not present

## 2024-07-02 DIAGNOSIS — I7121 Aneurysm of the ascending aorta, without rupture: Secondary | ICD-10-CM | POA: Insufficient documentation

## 2024-07-02 DIAGNOSIS — Z0000 Encounter for general adult medical examination without abnormal findings: Secondary | ICD-10-CM | POA: Diagnosis not present

## 2024-07-02 DIAGNOSIS — I10 Essential (primary) hypertension: Secondary | ICD-10-CM | POA: Diagnosis not present

## 2024-07-02 DIAGNOSIS — I350 Nonrheumatic aortic (valve) stenosis: Secondary | ICD-10-CM | POA: Insufficient documentation

## 2024-07-02 DIAGNOSIS — M1711 Unilateral primary osteoarthritis, right knee: Secondary | ICD-10-CM | POA: Diagnosis not present

## 2024-07-02 NOTE — Progress Notes (Addendum)
 "  Chief Complaint  Patient presents with   Medicare Wellness     Subjective:   [name] Glass is a 84 y.o. male who presents for a Medicare Annual Wellness Visit.  Visit info / Clinical Intake: Medicare Wellness Visit Type:: Subsequent Annual Wellness Visit Persons participating in visit and providing information:: patient Medicare Wellness Visit Mode:: Telephone If telephone:: video declined Since this visit was completed virtually, some vitals may be partially provided or unavailable. Missing vitals are due to the limitations of the virtual format.: Documented vitals are patient reported If Telephone or Video please confirm:: I connected with patient using audio/video enable telemedicine. I verified patient identity with two identifiers, discussed telehealth limitations, and patient agreed to proceed. Patient Location:: Home Provider Location:: Office Interpreter Needed?: No Pre-visit prep was completed: yes AWV questionnaire completed by patient prior to visit?: no Living arrangements:: lives with spouse/significant other Patient's Overall Health Status Rating: good Does pain affect daily life?: no Are you currently prescribed opioids?: (!) yes  Dietary Habits and Nutritional Risks How many meals a day?: 3 Eats fruit and vegetables daily?: yes Most meals are obtained by: preparing own meals; eating out In the last 2 weeks, have you had any of the following?: none Diabetic:: no  Functional Status Activities of Daily Living (to include ambulation/medication): Independent Ambulation: Independent with device- listed below Home Assistive Devices/Equipment: Eyeglasses; CPAP Medication Administration: Independent Home Management (perform basic housework or laundry): Independent Manage your own finances?: yes Primary transportation is: driving Concerns about vision?: no *vision screening is required for WTM* Concerns about hearing?: no  Fall Screening Falls in the past year?:  0 Number of falls in past year: 0 Was there an injury with Fall?: 0 Fall Risk Category Calculator: 0 Patient Fall Risk Level: Low Fall Risk  Fall Risk Patient at Risk for Falls Due to: No Fall Risks Fall risk Follow up: Falls evaluation completed; Falls prevention discussed  Home and Transportation Safety: All rugs have non-skid backing?: N/A, no rugs All stairs or steps have railings?: yes Grab bars in the bathtub or shower?: yes Have non-skid surface in bathtub or shower?: yes Good home lighting?: yes Hospital stays in the last year:: no  Cognitive Assessment Difficulty concentrating, remembering, or making decisions? : no Will 6CIT or Mini Cog be Completed: yes What year is it?: 0 points What month is it?: 0 points Give patient an address phrase to remember (5 components): [address] [place] About what time is it?: 0 points Count backwards from 20 to 1: 0 points Say the months of the year in reverse: 0 points Repeat the address phrase from earlier: 0 points 6 CIT Score: 0 points  Advance Directives (For Healthcare) Does Patient Have a Medical Advance Directive?: Yes Does patient want to make changes to medical advance directive?: Yes (Inpatient - patient requests chaplain consult to change a medical advance directive) Type of Advance Directive: Healthcare Power of [place]; Living will Copy of Healthcare Power of Attorney in Chart?: No - copy requested Copy of Living Will in Chart?: No - copy requested  Reviewed/Updated  Reviewed/Updated: Reviewed All (Medical, Surgical, Family, Medications, Allergies, Care Teams, Patient Goals)    Allergies (verified) Morphine and codeine, Bactrim [sulfamethoxazole-trimethoprim], Morphine, Statins, Sulfamethoxazole, and Trimethoprim   Current Medications (verified) Outpatient Encounter Medications as of 07/02/2024  Medication Sig   acyclovir  (ZOVIRAX ) 200 MG capsule TAKE 1 CAPSULE BY MOUTH THREE TIMES A DAY   albuterol   (VENTOLIN  HFA) 108 (90 Base) MCG/ACT inhaler  Inhale 2 puffs into the lungs every 6 (six) hours as needed for wheezing or shortness of breath.   anastrozole  (ARIMIDEX ) 1 MG tablet Take 1 tablet (1 mg total) by mouth every 3 (three) days.   Cholecalciferol (VITAMIN D-3) 125 MCG (5000 UT) TABS Take 125 mcg by mouth daily.   ezetimibe  (ZETIA ) 10 MG tablet TAKE 1 TABLET BY MOUTH EVERY DAY   finasteride  (PROSCAR ) 5 MG tablet Take 1 tablet (5 mg total) by mouth daily.   fluticasone  (FLONASE ) 50 MCG/ACT nasal spray Place 2 sprays into both nostrils daily.   Fluticasone -Umeclidin-Vilant (TRELEGY ELLIPTA ) 100-62.5-25 MCG/ACT AEPB Inhale 1 puff into the lungs daily.   HYDROcodone  bit-homatropine (HYCODAN) 5-1.5 MG/5ML syrup Take 5 mLs by mouth every 8 (eight) hours as needed for cough.   ipratropium (ATROVENT ) 0.03 % nasal spray Place 2 sprays into both nostrils 3 (three) times daily as needed for rhinitis.   levocetirizine (XYZAL ) 5 MG tablet Take 1 tablet (5 mg total) by mouth every evening.   levothyroxine  (SYNTHROID ) 75 MCG tablet Take 1 tablet (75 mcg total) by mouth daily.   meloxicam  (MOBIC ) 15 MG tablet Take 1 tablet (15 mg total) by mouth daily.   montelukast  (SINGULAIR ) 10 MG tablet Take 1 tablet (10 mg total) by mouth at bedtime.   omeprazole  (PRILOSEC) 20 MG capsule Take by mouth.   Pitavastatin  Calcium  4 MG TABS Take 1 tablet (4 mg total) by mouth daily.   potassium gluconate 595 (99 K) MG TABS tablet Take by mouth.   Simethicone  40 MG/0.6ML LIQD Take 0.6 mLs (40 mg total) by mouth 4 (four) times daily.   Somatropin  (OMNITROPE ) 5 MG/1.5ML SOCT Inject into the skin.   tamsulosin  (FLOMAX ) 0.4 MG CAPS capsule TAKE 1 CAPSULE BY MOUTH EVERY DAY IN THE MORNING   testosterone  cypionate (DEPOTESTOSTERONE CYPIONATE) 200 MG/ML injection Inject 0.5 mLs (100 mg total) into the muscle once a week.   Facility-Administered Encounter Medications as of 07/02/2024  Medication   lidocaine  (XYLOCAINE ) 1 % (with  pres) injection 6 mL   triamcinolone  acetonide (KENALOG -40) injection 5.2 mg    History: Past Medical History:  Diagnosis Date   Arthritis    Basal cell carcinoma 02/17/2020   infl & ulcerated-Left forehead (MOHS)   Coronary artery disease    GERD (gastroesophageal reflux disease)    H/O bronchitis    H/O hiatal hernia    History of kidney stones last in 1976   Hyperlipidemia    statin intolerant, under control   Hypertension    under control   Hypertensive retinopathy    OU   Hypothyroidism    Moderate persistent asthma without complication 06/09/2019   Retinal detachment    OD   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   left helix tx cx3 2fu   Sleep apnea    hx of, surgery to reverse   Past Surgical History:  Procedure Laterality Date   CARDIAC CATHETERIZATION Right 2008   5 heart stents   CATARACT EXTRACTION Bilateral 6 years ago   CYSTOSCOPY N/A 08/06/2012   Procedure: CYSTOSCOPY;  Surgeon: Norleen JINNY Seltzer, MD;  Location: [facility];  Service: Urology;  Laterality: N/A;   ESOPHAGOGASTRIC FUNDOPLICATION  1985   spleen removed, 5 pints of blood given   EYE SURGERY     HERNIA REPAIR  1985   x4   INGUINAL HERNIA REPAIR  1992   PACEMAKER IMPLANT N/A 10/30/2018   Procedure: PACEMAKER IMPLANT;  Surgeon: Inocencio Soyla Lunger, MD;  Location: [facility];  Service: Cardiovascular;  Laterality: N/A;   PACEMAKER INSERTION     PPM GENERATOR REMOVAL N/A 10/25/2018   Procedure: PPM GENERATOR REMOVAL;  Surgeon: Inocencio Soyla Lunger, MD;  Location: [facility];  Service: Cardiovascular;  Laterality: N/A;   PROSTATE ABLATION  2013   laser ablation   RETINAL DETACHMENT SURGERY Right 01/24/2019   Pneumatic cryopexy - Dr. Redell Hans   TEE WITHOUT CARDIOVERSION N/A 10/25/2018   Procedure: TRANSESOPHAGEAL ECHOCARDIOGRAM (TEE);  Surgeon: Alveta Aleene PARAS, MD;  Location: [facility];  Service: Cardiovascular;  Laterality: N/A;   TEMPORARY PACEMAKER N/A 10/25/2018    Procedure: TEMPORARY PACEMAKER;  Surgeon: Inocencio Soyla Lunger, MD;  Location: [facility];  Service: Cardiovascular;  Laterality: N/A;   TONSILLECTOMY  as child   TOTAL KNEE ARTHROPLASTY Right 08/03/2014   Procedure: TOTAL RIGHT KNEE ARTHROPLASTY;  Surgeon: Dempsey Melodi GAILS, MD;  Location: [facility];  Service: Orthopedics;  Laterality: Right;   TRANSURETHRAL RESECTION OF PROSTATE N/A 08/06/2012   Procedure: AMIL OF PROSTATE WITH GYRUS ;  Surgeon: Norleen PARAS Seltzer, MD;  Location: [facility];  Service: Urology;  Laterality: N/A;   UMBILICAL HERNIA REPAIR  1990   UVULECTOMY  2005   for sleep apnea   UVULOPALATOPHARYNGOPLASTY  6 years ago   Family History  Problem Relation Age of Onset   Heart attack Father 41       lived to 63   Alzheimer's disease Mother    Stroke Mother    Breast cancer Mother    Alzheimer's disease Maternal Grandmother    Stroke Maternal Grandmother    Lung cancer Maternal Grandmother    Lung cancer Maternal Grandfather    Cancer Paternal Grandmother        type unknown   Diabetes Paternal Grandfather    Other Paternal Grandfather        Growth in the back of the head   Social History   Occupational History   Occupation: Retired   Tobacco Use   Smoking status: Never   Smokeless tobacco: Never  Vaping Use   Vaping status: Never Used  Substance and Sexual Activity   Alcohol use: Yes    Alcohol/week: 6.0 standard drinks of alcohol    Types: 2 Shots of liquor, 4 Standard drinks or equivalent per week    Comment: 3 drinks at night    Drug use: No   Sexual activity: Not Currently   Tobacco Counseling Counseling given: No  SDOH Screenings   Food Insecurity: No Food Insecurity (07/02/2024)  Housing: Unknown (07/02/2024)  Transportation Needs: No Transportation Needs (07/02/2024)  Utilities: Not At Risk (07/02/2024)  Alcohol Screen: Low Risk (05/01/2022)  Depression (PHQ2-9): Low Risk (07/02/2024)  Financial Resource Strain: Low Risk (07/02/2023)  Physical  Activity: Inactive (07/02/2024)  Social Connections: Moderately Integrated (07/02/2024)  Stress: No Stress Concern Present (07/02/2024)  Tobacco Use: Low Risk (07/02/2024)  Health Literacy: Adequate Health Literacy (07/02/2024)   See flowsheets for full screening details  Depression Screen PHQ 2 & 9 Depression Scale- Over the past 2 weeks, how often have you been bothered by any of the following problems? Little interest or pleasure in doing things: 0 Feeling down, depressed, or hopeless (PHQ Adolescent also includes...irritable): 0 PHQ-2 Total Score: 0 Trouble falling or staying asleep, or sleeping too much: 0 Feeling tired or having little energy: 0 Poor appetite or overeating (PHQ Adolescent also includes...weight loss): 0 Feeling bad about yourself - or that you are  a failure or have let yourself or your family down: 0 Trouble concentrating on things, such as reading the newspaper or watching television (PHQ Adolescent also includes...like school work): 0 Moving or speaking so slowly that other people could have noticed. Or the opposite - being so fidgety or restless that you have been moving around a lot more than usual: 0 Thoughts that you would be better off dead, or of hurting yourself in some way: 0 PHQ-9 Total Score: 0 If you checked off any problems, how difficult have these problems made it for you to do your work, take care of things at home, or get along with other people?: Not difficult at all  Depression Treatment Depression Interventions/Treatment : EYV7-0 Score <4 Follow-up Not Indicated     Goals Addressed               This Visit's Progress     Patient Stated (pt-stated)        Patient stated he plans to continue taking meds daily and looking forward to celebrating a birthday             Objective:    Today's Vitals   07/02/24 0932  Weight: 195 lb (88.5 kg)  Height: 6' (1.829 m)   Body mass index is 26.45 kg/m.  Hearing/Vision screen Hearing  Screening - Comments:: Denies hearing difficulties   Vision Screening - Comments:: Wears rx glasses - up to date with routine eye exams with [facility] Immunizations and Health Maintenance Health Maintenance  Topic Date Due   Bone Density Scan  Never done   COVID-19 Vaccine (6 - Pfizer risk 2025-26 season) 08/19/2024   Medicare Annual Wellness (AWV)  07/02/2025   DTaP/Tdap/Td (2 - Td or Tdap) 06/27/2027   Pneumococcal Vaccine: 50+ Years  Completed   Influenza Vaccine  Completed   Zoster Vaccines- Shingrix  Completed   Meningococcal B Vaccine  Aged Out        Assessment/Plan:  This is a routine wellness examination for Clem.  Patient Care Team: Joshua Debby CROME, MD as PCP - General (Internal Medicine) Court Dorn PARAS, MD as PCP - Cardiology (Cardiology) Livingston Rigg, MD as Consulting Physician (Dermatology)  Rollene Rosina Pugh, MD (Ophthalmology) Cleatus Collar, MD as Consulting Physician (Ophthalmology)  I have personally reviewed and noted the following in the patients chart:   Medical and social history Use of alcohol, tobacco or illicit drugs  Current medications and supplements including opioid prescriptions. Functional ability and status Nutritional status Physical activity Advanced directives List of other physicians Hospitalizations, surgeries, and ER visits in previous 12 months Vitals Screenings to include cognitive, depression, and falls Referrals and appointments  No orders of the defined types were placed in this encounter.  In addition, I have reviewed and discussed with patient certain preventive protocols, quality metrics, and best practice recommendations. A written personalized care plan for preventive services as well as general preventive health recommendations were provided to patient.   Verdie CHRISTELLA Saba, CMA   07/02/2024   Return in 1 year (on 07/02/2025).  After Visit Summary: (MyChart) Due to this being a telephonic visit, the  after visit summary with patients personalized plan was offered to patient via MyChart   Nurse Notes: scheduled 2027 AWV appt "

## 2024-07-02 NOTE — Assessment & Plan Note (Signed)
 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  Will continue to follow this on an annual basis.

## 2024-07-02 NOTE — Assessment & Plan Note (Signed)
 History of hyperlipidemia on Livalo  and Zetia  lipid profile performed 8//25 revealing total cholesterol 145, LDL 71 and HDL 43.

## 2024-07-02 NOTE — Assessment & Plan Note (Signed)
 2D echo performed a year ago showed mild aortic stenosis.  Echo performed 06/11/2024 did not suggest that there was any aortic stenosis present.  Aortic valve area measured 1.72 cm.  The peak gradient was 27 mmHg.  We will continue to follow this on an annual basis.

## 2024-07-02 NOTE — Assessment & Plan Note (Signed)
 History of essential hypertension blood pressure measured today at 114/72.  He is not on antihypertensive medications.

## 2024-07-02 NOTE — Assessment & Plan Note (Signed)
 History of permanent transvenous pacemaker implantation in [place]  with GEN change and lead change by Dr. Inocencio 10/30/2018.  Dr. Fernande was his pacemaker doctor and I suspect he will transition to somebody else.

## 2024-07-02 NOTE — Patient Instructions (Signed)
 Medication Instructions:  Your physician recommends that you continue on your current medications as directed. Please refer to the Current Medication list given to you today.  *If you need a refill on your cardiac medications before your next appointment, please call your pharmacy*  Testing/Procedures: Your physician has requested that you have an echocardiogram. Echocardiography is a painless test that uses sound waves to create images of your heart. It provides your doctor with information about the size and shape of your heart and how well your hearts chambers and valves are working. This procedure takes approximately one hour. There are no restrictions for this procedure. Please do NOT wear cologne, perfume, aftershave, or lotions (deodorant is allowed). Please arrive 15 minutes prior to your appointment time.  Please note: We ask at that you not bring children with you during ultrasound (echo/ vascular) testing. Due to room size and safety concerns, children are not allowed in the ultrasound rooms during exams. Our front office staff cannot provide observation of children in our lobby area while testing is being conducted. An adult accompanying a patient to their appointment will only be allowed in the ultrasound room at the discretion of the ultrasound technician under special circumstances. We apologize for any inconvenience.  **To do in January 2027**   Follow-Up: At [facility], you and your health needs are our priority.  As part of our continuing mission to provide you with exceptional heart care, our providers are all part of one team.  This team includes your primary Cardiologist (physician) and Advanced Practice Providers or APPs (Physician Assistants and Nurse Practitioners) who all work together to provide you with the care you need, when you need it.  Your next appointment:   12 month(s)  Provider:   Dorn Lesches, MD    We recommend signing up for the patient  portal called MyChart.  Sign up information is provided on this After Visit Summary.  MyChart is used to connect with patients for Virtual Visits (Telemedicine).  Patients are able to view lab/test results, encounter notes, upcoming appointments, etc.  Non-urgent messages can be sent to your provider as well.   To learn more about what you can do with MyChart, go to [email or web address].   Other Instructions

## 2024-07-02 NOTE — Patient Instructions (Signed)
 [name],  Thank you for taking the time for your Medicare Wellness Visit. I appreciate your continued commitment to your health goals. Please review the care plan we discussed, and feel free to reach out if I can assist you further.  Please note that Annual Wellness Visits do not include a physical exam. Some assessments may be limited, especially if the visit was conducted virtually. If needed, we may recommend an in-person follow-up with your provider.  Ongoing Care Seeing your primary care provider every 3 to 6 months helps us  monitor your health and provide consistent, personalized care.   Referrals If a referral was made during today's visit and you haven't received any updates within two weeks, please contact the referred provider directly to check on the status.  Recommended Screenings:  Health Maintenance  Topic Date Due   Osteoporosis screening with Bone Density Scan  Never done   COVID-19 Vaccine (6 - Pfizer risk 2025-26 season) 08/19/2024   Medicare Annual Wellness Visit  07/02/2025   DTaP/Tdap/Td vaccine (2 - Td or Tdap) 06/27/2027   Pneumococcal Vaccine for age over 76  Completed   Flu Shot  Completed   Zoster (Shingles) Vaccine  Completed   Meningitis B Vaccine  Aged Out       10/05/2023    1:52 PM  Advanced Directives  Does Patient Have a Medical Advance Directive? Yes  Type of Estate Agent of [place];Living will  Does patient want to make changes to medical advance directive? No - Patient declined    Vision: Annual vision screenings are recommended for early detection of glaucoma, cataracts, and diabetic retinopathy. These exams can also reveal signs of chronic conditions such as diabetes and high blood pressure.  Dental: Annual dental screenings help detect early signs of oral cancer, gum disease, and other conditions linked to overall health, including heart disease and diabetes.

## 2024-07-02 NOTE — Progress Notes (Signed)
 "     07/02/2024 [name] Glass   [birth year]  [id number]  Primary Physician Joshua Debby CROME, MD Primary Cardiologist: Dorn JINNY Lesches MD FACP, [place], [place], FSCAI  HPI:  [name] Glass is a 84 y.o.  moderately overweight although fit-appearing married Caucasian male with no children who I last saw 06/12/2023.  He is accompanied by his wife [name] Glass today.  He has a history of CAD status post circumflex and RCA stenting by myself 06/09/04. His other cardiovascular problems include hypertension, hyperlipidemia and statin intolerance. He does drink Pinch blended scotch on a daily basis. He denies chest pain or shortness of breath. 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 He had a Myoview stress test performed 07/16/14 for preoperative clearance prior to a right total knee replacement which was performed by Dr. Ivonne 08/03/14. This was low risk and nonischemic. Since I saw him last he was down in [place] at the [employer] and developed some throat pain. This led to an extensive workup in the hospital including fairly normal 2-D echo, a Myoview stress test. He ruled out for myocardial infarction. He scheduled to have cosmetic surgery on his eyelids and eyebrow 09/27/17 when she is cleared for given his recent negative stress test. He also recently got engaged and and married this past June.  He went on a 15-day cruise to [place].   He was in [place] last year snowmobiling and had an episode of witnessed syncope.  He ultimately was transported by EMS to [facility] where he underwent cardiac catheterization by Dr. Arnett radially revealing what he describes as patent stents in his circumflex and RCA which I implanted back in 2006 with high-grade calcified LAD stenosis.  He underwent staged atherectomy and drug-eluting stent implantation.  He also was noted to have long pauses on telemetry with recurrent  syncope and and had a temporary transvenous pacemaker placed followed by permanent pacemaker implantation.     He did transition from Brilinta  to Plavix  because of dyspnea.  He was hospitalized in May 2020, for sepsis.  He tested negative for COVID-19.  He did have a transesophageal echo that did not show evidence of SBE.  He developed a pocket infection and ended up having his pacemaker generator and leads were placed by Dr. Inocencio 10/30/2018.  He has developed significant hematuria and has stopped his Plavix .   Since I saw him in the office a year ago he continues to do well.  He denies chest pain but does get some shortness of breath.  Echo performed 06/11/2024 revealed normal LV systolic function, grade 1 diastolic dysfunction and probably mild aortic stenosis with a valve area 1.7 cm and a peak gradient of 27 mmHg.  His ascending aorta measured 41 mm.  Will continue to follow this noninvasively.   Active Medications[1]   Allergies[2]  Social History   Socioeconomic History   Marital status: Married    Spouse name: [name] Glass   Number of children: 0   Years of education: Not on file   Highest education level: Not on file  Occupational History   Occupation: Retired   Tobacco Use   Smoking status: Never   Smokeless tobacco: Never  Vaping Use   Vaping status: Never Used  Substance and Sexual Activity   Alcohol use: Yes    Alcohol/week: 6.0 standard drinks of alcohol    Types: 2 Shots of liquor, 4 Standard  drinks or equivalent per week    Comment: 3 drinks at night    Drug use: No   Sexual activity: Not Currently  Other Topics Concern   Not on file  Social History Narrative   Lives with wife   Social Drivers of Health   Tobacco Use: Low Risk (07/02/2024)   Patient History    Smoking Tobacco Use: Never    Smokeless Tobacco Use: Never    Passive Exposure: Not on file  Financial Resource Strain: Low Risk (07/02/2023)   Overall Financial Resource Strain (CARDIA)    Difficulty of  Paying Living Expenses: Not hard at all  Food Insecurity: No Food Insecurity (07/02/2024)   Epic    Worried About Radiation Protection Practitioner of Food in the Last Year: Never true    Ran Out of Food in the Last Year: Never true  Transportation Needs: No Transportation Needs (07/02/2024)   Epic    Lack of Transportation (Medical): No    Lack of Transportation (Non-Medical): No  Physical Activity: Inactive (07/02/2024)   Exercise Vital Sign    Days of Exercise per Week: 0 days    Minutes of Exercise per Session: 0 min  Stress: No Stress Concern Present (07/02/2024)   [employer] of Occupational Health - Occupational Stress Questionnaire    Feeling of Stress: Not at all  Social Connections: Moderately Integrated (07/02/2024)   Social Connection and Isolation Panel    Frequency of Communication with Friends and Family: More than three times a week    Frequency of Social Gatherings with Friends and Family: More than three times a week    Attends Religious Services: Never    Database Administrator or Organizations: Yes    Attends Banker Meetings: Never    Marital Status: Married  Catering Manager Violence: Not At Risk (07/02/2024)   Epic    Fear of Current or Ex-Partner: No    Emotionally Abused: No    Physically Abused: No    Sexually Abused: No  Depression (PHQ2-9): Low Risk (07/02/2024)   Depression (PHQ2-9)    PHQ-2 Score: 0  Alcohol Screen: Low Risk (05/01/2022)   Alcohol Screen    Last Alcohol Screening Score (AUDIT): 2  Housing: Unknown (07/02/2024)   Epic    Unable to Pay for Housing in the Last Year: No    Number of Times Moved in the Last Year: Not on file    Homeless in the Last Year: No  Utilities: Not At Risk (07/02/2024)   Epic    Threatened with loss of utilities: No  Health Literacy: Adequate Health Literacy (07/02/2024)   B1300 Health Literacy    Frequency of need for help with medical instructions: Never     Review of Systems: General: negative for chills,  fever, night sweats or weight changes.  Cardiovascular: negative for chest pain, dyspnea on exertion, edema, orthopnea, palpitations, paroxysmal nocturnal dyspnea or shortness of breath Dermatological: negative for rash Respiratory: negative for cough or wheezing Urologic: negative for hematuria Abdominal: negative for nausea, vomiting, diarrhea, bright red blood per rectum, melena, or hematemesis Neurologic: negative for visual changes, syncope, or dizziness All other systems reviewed and are otherwise negative except as noted above.    Blood pressure 114/72, pulse 65, height 6' (1.829 m), weight 202 lb (91.6 kg), SpO2 92%.  General appearance: alert and no distress Neck: no adenopathy, no carotid bruit, no JVD, supple, symmetrical, trachea midline, and thyroid  not enlarged, symmetric, no tenderness/mass/nodules Lungs: clear to auscultation  bilaterally Heart: regular rate and rhythm, S1, S2 normal, no murmur, click, rub or gallop Extremities: extremities normal, atraumatic, no cyanosis or edema Pulses: 2+ and symmetric Skin: Skin color, texture, turgor normal. No rashes or lesions Neurologic: Grossly normal  EKG EKG Interpretation Date/Time:  Wednesday July 02 2024 13:33:30 EST Ventricular Rate:  65 PR Interval:  272 QRS Duration:  148 QT Interval:  444 QTC Calculation: 461 R Axis:   -70  Text Interpretation: Sinus rhythm with 1st degree A-V block Left axis deviation Right bundle branch block Possible Lateral infarct , age undetermined Inferior infarct , age undetermined When compared with ECG of 15-Oct-2021 09:11, PREVIOUS ECG IS PRESENT Confirmed by Court Carrier [phone]) on 07/02/2024 1:38:06 PM    ASSESSMENT AND PLAN:   Coronary artery disease History of CAD status post circumflex and RCA stenting by myself 06/09/2004.  Myoview performed 07/16/2014 was nonischemic.  He was cath in [place]  and was found to have patent RCA and circumflex stents and underwent atherectomy and  stenting of the LAD.  He is currently asymptomatic.  Essential hypertension, benign History of essential hypertension blood pressure measured today at 114/72.  He is not on antihypertensive medications.  Hyperlipidemia with target LDL less than 70 History of hyperlipidemia on Livalo  and Zetia  lipid profile performed 8//25 revealing total cholesterol 145, LDL 71 and HDL 43.  Presence of permanent cardiac pacemaker History of permanent transvenous pacemaker implantation in [place]  with GEN change and lead change by Dr. Inocencio 10/30/2018.  Dr. Fernande was his pacemaker doctor and I suspect he will transition to somebody else.  Thoracic aortic aneurysm Small thoracic aortic aneurysm measuring 41 mm by 2D echo 06/11/2024.  Will continue to follow this on an annual basis.  Moderate aortic stenosis 2D echo performed a year ago showed mild aortic stenosis.  Echo performed 06/11/2024 did not suggest that there was any aortic stenosis present.  Aortic valve area measured 1.72 cm.  The peak gradient was 27 mmHg.  We will continue to follow this on an annual basis.     Carrier DOROTHA Court MD FACP,FACC,FAHA, FSCAI 07/02/2024 1:57 PM    [1]  Current Meds  Medication Sig   acyclovir  (ZOVIRAX ) 200 MG capsule TAKE 1 CAPSULE BY MOUTH THREE TIMES A DAY   albuterol  (VENTOLIN  HFA) 108 (90 Base) MCG/ACT inhaler Inhale 2 puffs into the lungs every 6 (six) hours as needed for wheezing or shortness of breath.   anastrozole  (ARIMIDEX ) 1 MG tablet Take 1 tablet (1 mg total) by mouth every 3 (three) days.   Cholecalciferol (VITAMIN D-3) 125 MCG (5000 UT) TABS Take 125 mcg by mouth daily.   ezetimibe  (ZETIA ) 10 MG tablet TAKE 1 TABLET BY MOUTH EVERY DAY   finasteride  (PROSCAR ) 5 MG tablet Take 1 tablet (5 mg total) by mouth daily.   fluticasone  (FLONASE ) 50 MCG/ACT nasal spray Place 2 sprays into both nostrils daily.   Fluticasone -Umeclidin-Vilant (TRELEGY ELLIPTA ) 100-62.5-25 MCG/ACT AEPB Inhale 1 puff into the lungs  daily.   HYDROcodone  bit-homatropine (HYCODAN) 5-1.5 MG/5ML syrup Take 5 mLs by mouth every 8 (eight) hours as needed for cough.   ipratropium (ATROVENT ) 0.03 % nasal spray Place 2 sprays into both nostrils 3 (three) times daily as needed for rhinitis.   levocetirizine (XYZAL ) 5 MG tablet Take 1 tablet (5 mg total) by mouth every evening.   levothyroxine  (SYNTHROID ) 75 MCG tablet Take 1 tablet (75 mcg total) by mouth daily.   meloxicam  (MOBIC ) 15 MG tablet Take 1 tablet (15  mg total) by mouth daily.   montelukast  (SINGULAIR ) 10 MG tablet Take 1 tablet (10 mg total) by mouth at bedtime.   omeprazole  (PRILOSEC) 20 MG capsule Take by mouth.   Pitavastatin  Calcium  4 MG TABS Take 1 tablet (4 mg total) by mouth daily.   potassium gluconate 595 (99 K) MG TABS tablet Take by mouth.   Simethicone  40 MG/0.6ML LIQD Take 0.6 mLs (40 mg total) by mouth 4 (four) times daily.   Somatropin  (OMNITROPE ) 5 MG/1.5ML SOCT Inject into the skin.   tamsulosin  (FLOMAX ) 0.4 MG CAPS capsule TAKE 1 CAPSULE BY MOUTH EVERY DAY IN THE MORNING   testosterone  cypionate (DEPOTESTOSTERONE CYPIONATE) 200 MG/ML injection Inject 0.5 mLs (100 mg total) into the muscle once a week.   Current Facility-Administered Medications for the 07/02/24 encounter (Office Visit) with Court Dorn PARAS, MD  Medication   lidocaine  (XYLOCAINE ) 1 % (with pres) injection 6 mL   triamcinolone  acetonide (KENALOG -40) injection 5.2 mg  [2]  Allergies Allergen Reactions   Morphine And Codeine     Had crazy dreams felt crazy per pt.   Bactrim [Sulfamethoxazole-Trimethoprim]    Morphine     Other Reaction(s): Not available   Statins    Sulfamethoxazole    Trimethoprim    "

## 2024-07-02 NOTE — Assessment & Plan Note (Signed)
 History of CAD status post circumflex and RCA stenting by myself 06/09/2004.  Myoview performed 07/16/2014 was nonischemic.  He was cath in [place]  and was found to have patent RCA and circumflex stents and underwent atherectomy and stenting of the LAD.  He is currently asymptomatic.

## 2024-07-04 ENCOUNTER — Telehealth (HOSPITAL_COMMUNITY): Payer: Self-pay

## 2024-07-04 ENCOUNTER — Encounter (HOSPITAL_COMMUNITY): Payer: Self-pay

## 2024-07-04 NOTE — Telephone Encounter (Signed)
 Scheduled pt for Pulm Rehab Orientation on 07/18/24. Pt will start exercise  on 07/24/24 and attend the 1:15pm class

## 2024-07-10 ENCOUNTER — Other Ambulatory Visit: Payer: Self-pay | Admitting: Physical Medicine and Rehabilitation

## 2024-07-10 ENCOUNTER — Other Ambulatory Visit: Payer: Self-pay | Admitting: Internal Medicine

## 2024-07-10 DIAGNOSIS — N400 Enlarged prostate without lower urinary tract symptoms: Secondary | ICD-10-CM

## 2024-07-10 DIAGNOSIS — E785 Hyperlipidemia, unspecified: Secondary | ICD-10-CM

## 2024-07-18 ENCOUNTER — Encounter (HOSPITAL_COMMUNITY)

## 2024-07-24 ENCOUNTER — Encounter (HOSPITAL_COMMUNITY)

## 2024-07-28 ENCOUNTER — Encounter: Attending: Physical Medicine and Rehabilitation | Admitting: Physical Medicine and Rehabilitation

## 2024-07-29 ENCOUNTER — Encounter (HOSPITAL_COMMUNITY)

## 2024-07-31 ENCOUNTER — Encounter (HOSPITAL_COMMUNITY)

## 2024-08-05 ENCOUNTER — Encounter (HOSPITAL_COMMUNITY)

## 2024-08-07 ENCOUNTER — Encounter (HOSPITAL_COMMUNITY)

## 2024-08-12 ENCOUNTER — Encounter (HOSPITAL_COMMUNITY)

## 2024-08-14 ENCOUNTER — Encounter (HOSPITAL_COMMUNITY)

## 2024-08-19 ENCOUNTER — Encounter (HOSPITAL_COMMUNITY)

## 2024-08-21 ENCOUNTER — Encounter (HOSPITAL_COMMUNITY)

## 2024-08-26 ENCOUNTER — Ambulatory Visit: Admitting: Pulmonary Disease

## 2024-08-26 ENCOUNTER — Encounter (HOSPITAL_COMMUNITY)

## 2024-08-28 ENCOUNTER — Encounter (HOSPITAL_COMMUNITY)

## 2024-09-02 ENCOUNTER — Encounter (HOSPITAL_COMMUNITY)

## 2024-09-04 ENCOUNTER — Encounter (HOSPITAL_COMMUNITY)

## 2024-09-09 ENCOUNTER — Encounter (HOSPITAL_COMMUNITY)

## 2024-09-11 ENCOUNTER — Encounter (HOSPITAL_COMMUNITY)

## 2024-09-16 ENCOUNTER — Encounter (HOSPITAL_COMMUNITY)

## 2024-09-18 ENCOUNTER — Encounter (HOSPITAL_COMMUNITY)

## 2024-09-23 ENCOUNTER — Encounter (HOSPITAL_COMMUNITY)

## 2024-09-25 ENCOUNTER — Encounter (HOSPITAL_COMMUNITY)

## 2024-09-30 ENCOUNTER — Encounter (HOSPITAL_COMMUNITY)

## 2024-10-02 ENCOUNTER — Encounter (HOSPITAL_COMMUNITY)

## 2024-10-07 ENCOUNTER — Encounter (HOSPITAL_COMMUNITY)

## 2024-10-09 ENCOUNTER — Encounter (HOSPITAL_COMMUNITY)

## 2024-10-14 ENCOUNTER — Encounter (HOSPITAL_COMMUNITY)
